# Patient Record
Sex: Male | Born: 1950
Health system: Southern US, Community
[De-identification: ages and names within clinical notes are randomized; demographics above are authoritative.]

## PROBLEM LIST (undated history)

## (undated) DIAGNOSIS — K922 Gastrointestinal hemorrhage, unspecified: Secondary | ICD-10-CM

## (undated) DIAGNOSIS — I10 Essential (primary) hypertension: Secondary | ICD-10-CM

## (undated) DIAGNOSIS — E785 Hyperlipidemia, unspecified: Secondary | ICD-10-CM

## (undated) DIAGNOSIS — D649 Anemia, unspecified: Secondary | ICD-10-CM

## (undated) DIAGNOSIS — K219 Gastro-esophageal reflux disease without esophagitis: Secondary | ICD-10-CM

## (undated) DIAGNOSIS — C801 Malignant (primary) neoplasm, unspecified: Secondary | ICD-10-CM

## (undated) DIAGNOSIS — R531 Weakness: Secondary | ICD-10-CM

## (undated) HISTORY — PX: SKIN SURGERY: SHX2413

---

## 2008-12-30 ENCOUNTER — Emergency Department (HOSPITAL_COMMUNITY): Admission: EM | Admit: 2008-12-30 | Discharge: 2008-12-30 | Payer: Self-pay | Admitting: Emergency Medicine

## 2014-03-29 IMAGING — US US ABDOMEN LIMITED
1 series · 13 of 25 positions shown · non-contrast
Comparison: None.

CLINICAL DATA: Transaminitis. History of duodenal ulcer with
adherent clot.

EXAM:
US ABDOMEN LIMITED - RIGHT UPPER QUADRANT

[Series 1: us abdomen limited · 0.30mm/px · 13 of 62 slices shown]
[im 1/62]
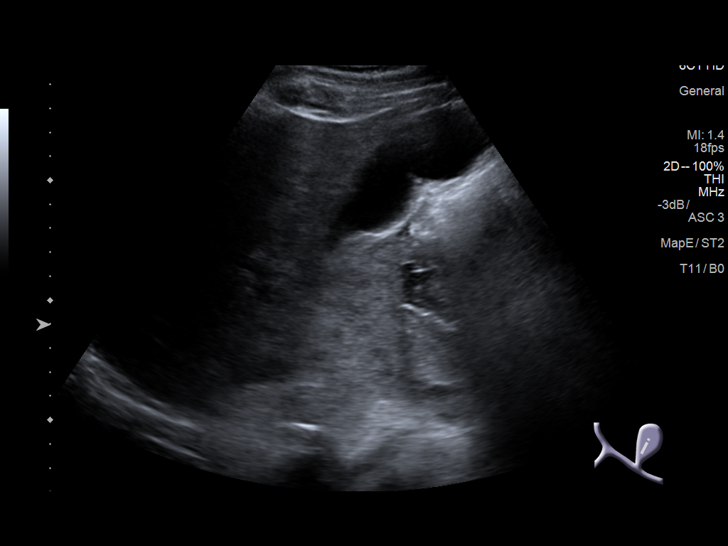
[im 6/62]
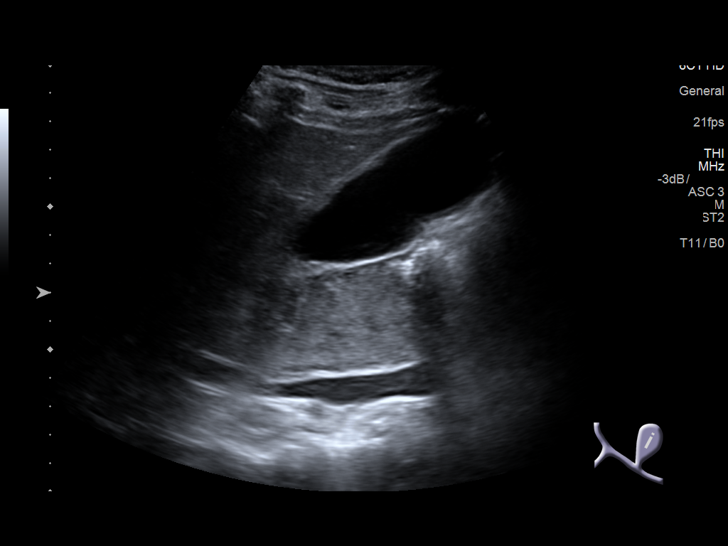
[im 11/62]
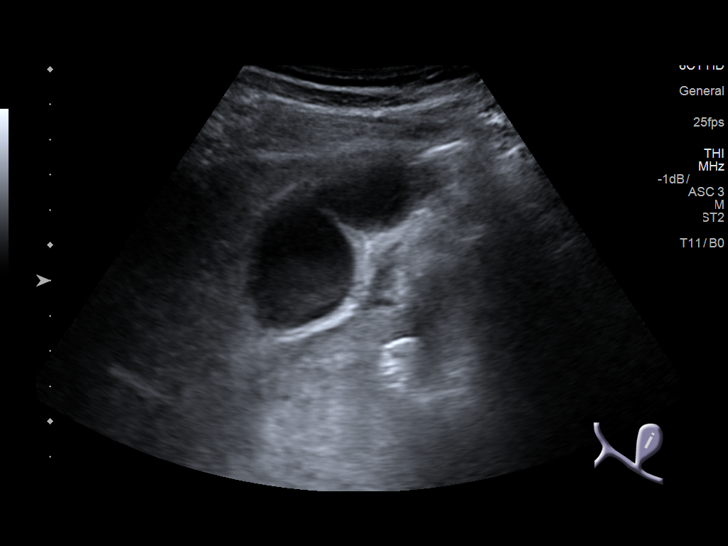
[im 16/62]
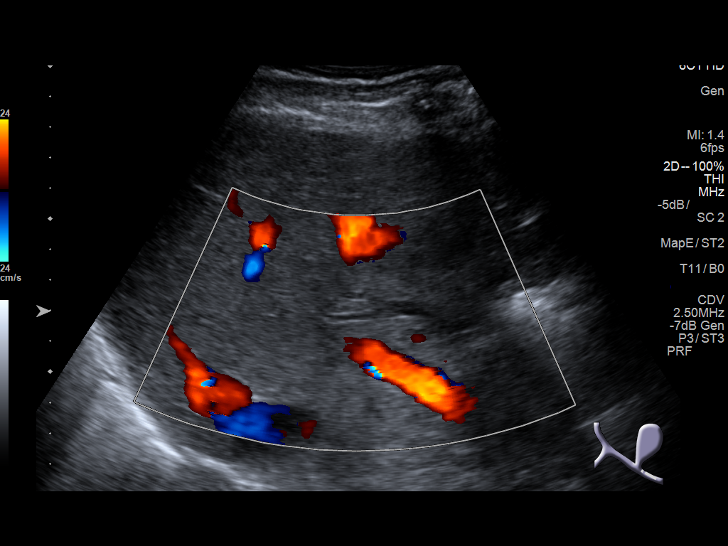
[im 21/62]
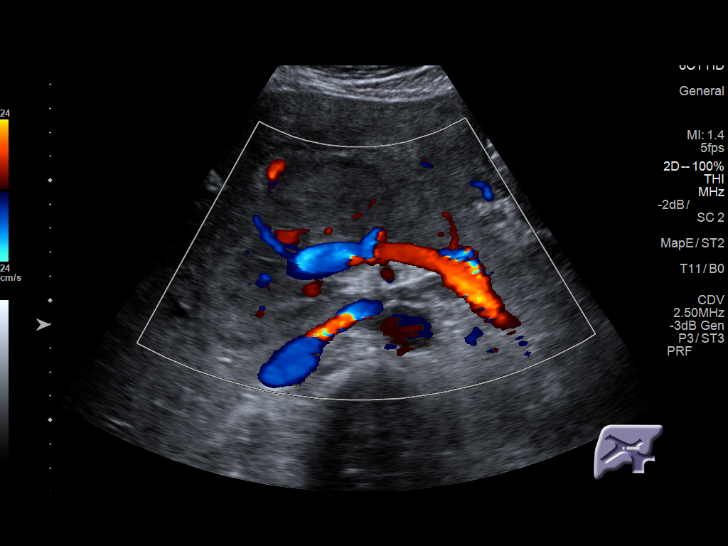
[im 26/62]
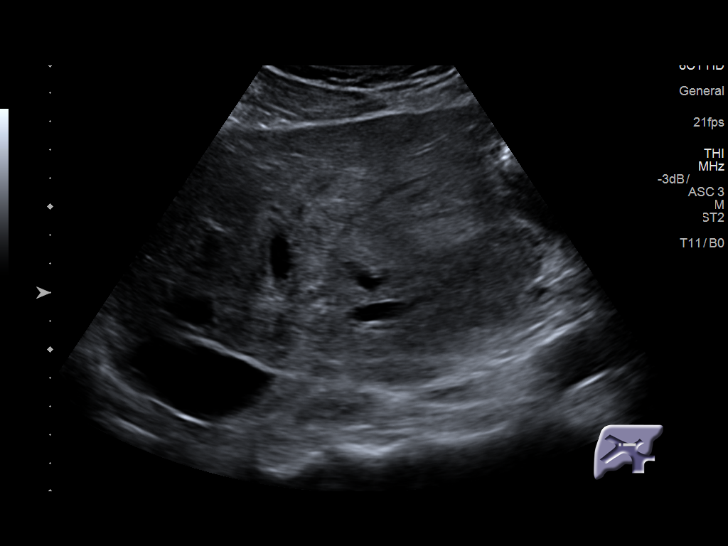
[im 31/62]
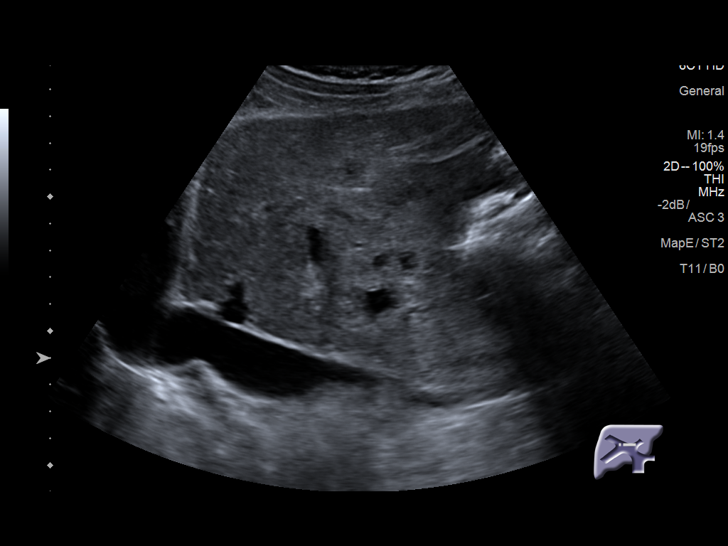
[im 36/62]
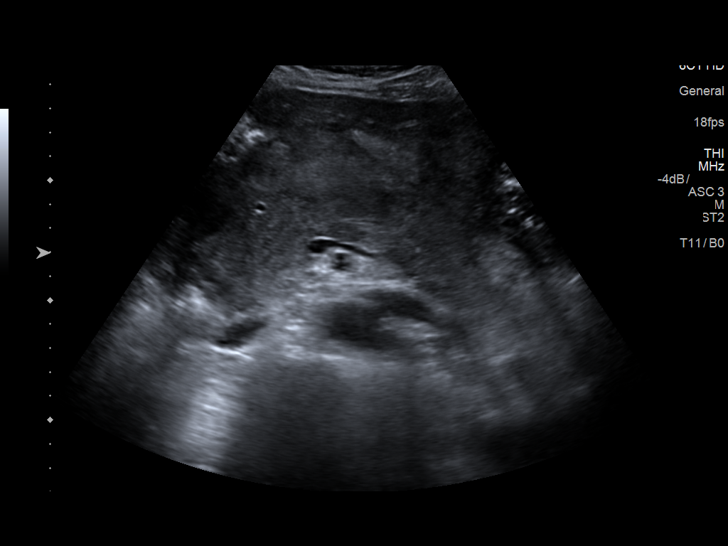
[im 41/62]
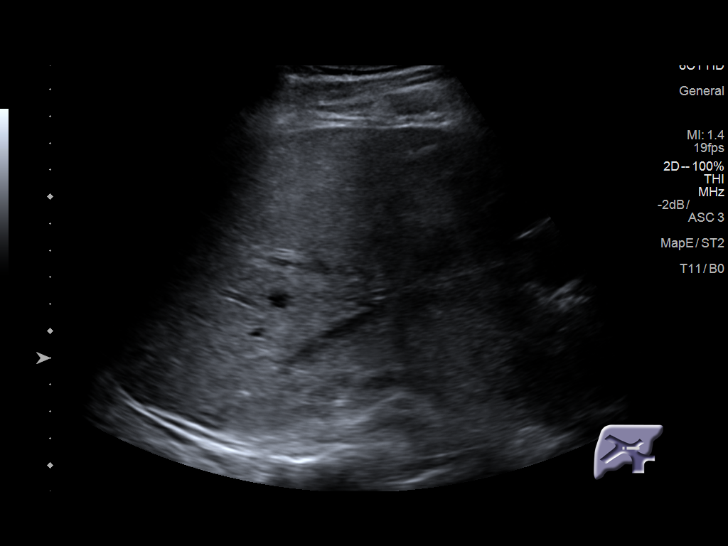
[im 46/62]
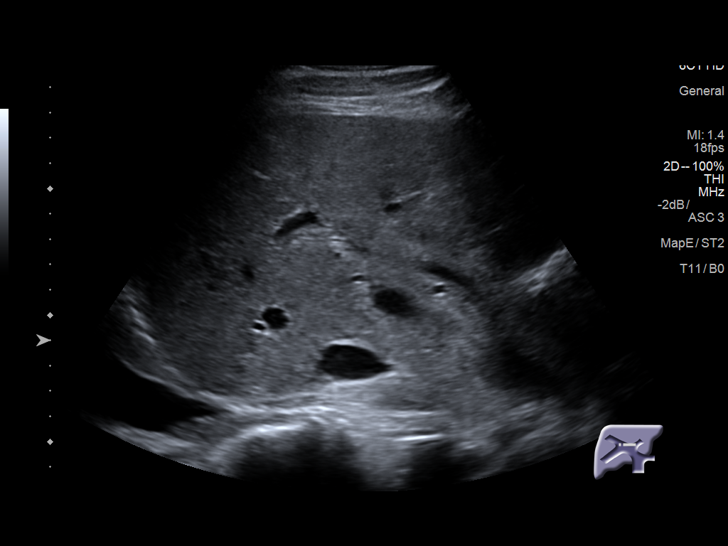
[im 51/62]
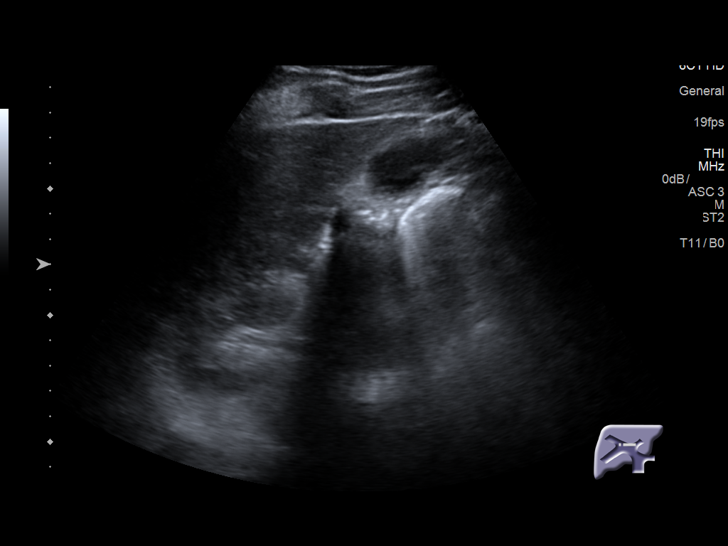
[im 56/62]
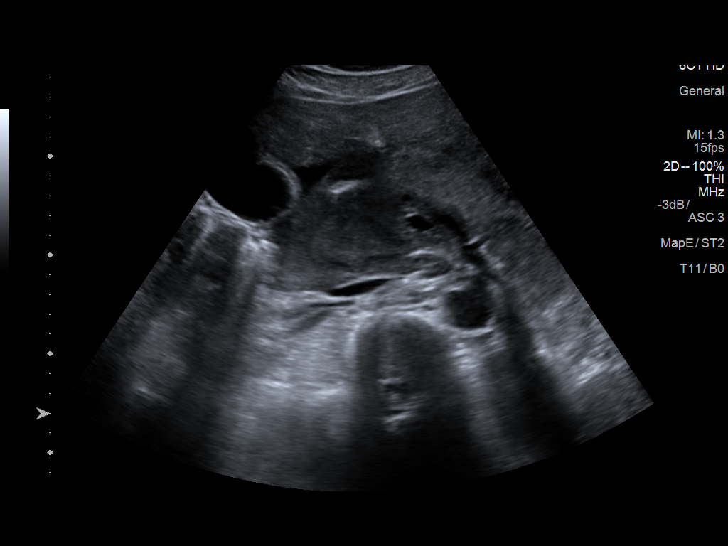
[im 62/62]
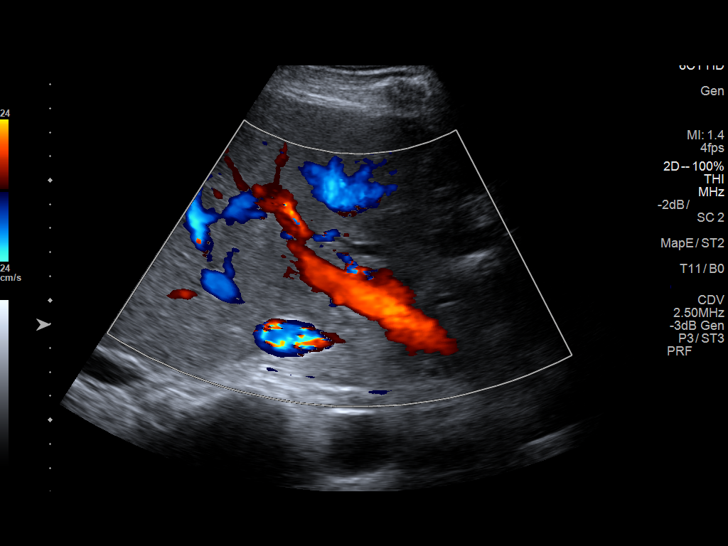

[13 of 25 positions shown; findings below may reference images not displayed]

FINDINGS: Gallbladder:

No gallstones or wall thickening visualized. Minimal dependent
sludge. No sonographic Murphy sign noted. Small amounts of
pericholecystic free fluid.

Common bile duct:

Diameter: 4.2 mm.

Liver:

Ill-defined heterogeneous echogenicity in the region of the
pancreatic head and porta hepatis, borders not well-defined. Mildly
heterogeneous hepatic parenchyma. No intrahepatic biliary ductal
dilatation. Normal directional flow in the main portal vein.

Right pleural effusion, incidentally noted.
IMPRESSION: 1. Heterogeneous echogenicity in the region of the porta hepatis and
pancreatic head, difficult to separate from the adjacent liver
parenchyma. This may reflect hematoma/blood products in the setting
of bleeding duodenal ulcer versus pancreatic head or focal liver
lesion. There is no biliary ductal dilatation. Recommend further
characterization with cross-sectional imaging, contrast-enhanced CT
or MRI.
2. Minimal sludge in the gallbladder, no wall thickening or
gallstones. Small amount of pericholecystic fluid. No sonographic
Murphy sign or findings of acute cholecystitis.

## 2015-01-17 ENCOUNTER — Encounter (HOSPITAL_COMMUNITY): Payer: Self-pay | Admitting: Emergency Medicine

## 2015-01-17 ENCOUNTER — Inpatient Hospital Stay (HOSPITAL_COMMUNITY)
Admission: EM | Admit: 2015-01-17 | Discharge: 2015-01-22 | DRG: 988 | Disposition: A | Discharge status: Home / Self Care | Payer: BLUE CROSS/BLUE SHIELD | Attending: Internal Medicine | Admitting: Internal Medicine

## 2015-01-17 DIAGNOSIS — Z9114 Patient's other noncompliance with medication regimen: Secondary | ICD-10-CM

## 2015-01-17 DIAGNOSIS — K921 Melena: Secondary | ICD-10-CM

## 2015-01-17 DIAGNOSIS — R1900 Intra-abdominal and pelvic swelling, mass and lump, unspecified site: Secondary | ICD-10-CM | POA: Diagnosis present

## 2015-01-17 DIAGNOSIS — R7989 Other specified abnormal findings of blood chemistry: Secondary | ICD-10-CM | POA: Diagnosis not present

## 2015-01-17 DIAGNOSIS — K922 Gastrointestinal hemorrhage, unspecified: Secondary | ICD-10-CM

## 2015-01-17 DIAGNOSIS — J9859 Other diseases of mediastinum, not elsewhere classified: Secondary | ICD-10-CM

## 2015-01-17 DIAGNOSIS — R740 Nonspecific elevation of levels of transaminase and lactic acid dehydrogenase [LDH]: Secondary | ICD-10-CM

## 2015-01-17 DIAGNOSIS — D649 Anemia, unspecified: Secondary | ICD-10-CM | POA: Diagnosis present

## 2015-01-17 DIAGNOSIS — Z8249 Family history of ischemic heart disease and other diseases of the circulatory system: Secondary | ICD-10-CM | POA: Diagnosis not present

## 2015-01-17 DIAGNOSIS — K264 Chronic or unspecified duodenal ulcer with hemorrhage: Secondary | ICD-10-CM | POA: Diagnosis present

## 2015-01-17 DIAGNOSIS — R590 Localized enlarged lymph nodes: Secondary | ICD-10-CM | POA: Diagnosis present

## 2015-01-17 DIAGNOSIS — R935 Abnormal findings on diagnostic imaging of other abdominal regions, including retroperitoneum: Secondary | ICD-10-CM

## 2015-01-17 DIAGNOSIS — E785 Hyperlipidemia, unspecified: Secondary | ICD-10-CM | POA: Diagnosis present

## 2015-01-17 DIAGNOSIS — I959 Hypotension, unspecified: Secondary | ICD-10-CM | POA: Diagnosis present

## 2015-01-17 DIAGNOSIS — Z79899 Other long term (current) drug therapy: Secondary | ICD-10-CM

## 2015-01-17 DIAGNOSIS — D801 Nonfamilial hypogammaglobulinemia: Secondary | ICD-10-CM | POA: Diagnosis not present

## 2015-01-17 DIAGNOSIS — D62 Acute posthemorrhagic anemia: Secondary | ICD-10-CM | POA: Insufficient documentation

## 2015-01-17 DIAGNOSIS — R55 Syncope and collapse: Secondary | ICD-10-CM | POA: Diagnosis present

## 2015-01-17 DIAGNOSIS — R7401 Elevation of levels of liver transaminase levels: Secondary | ICD-10-CM

## 2015-01-17 DIAGNOSIS — I10 Essential (primary) hypertension: Secondary | ICD-10-CM | POA: Diagnosis present

## 2015-01-17 DIAGNOSIS — D508 Other iron deficiency anemias: Secondary | ICD-10-CM | POA: Diagnosis not present

## 2015-01-17 HISTORY — DX: Essential (primary) hypertension: I10

## 2015-01-17 HISTORY — DX: Hyperlipidemia, unspecified: E78.5

## 2015-01-17 LAB — CBC
HCT: 14.8 % — ABNORMAL LOW (ref 39.0–52.0)
Hemoglobin: 4.5 g/dL — CL (ref 13.0–17.0)
MCH: 25 pg — ABNORMAL LOW (ref 26.0–34.0)
MCHC: 30.4 g/dL (ref 30.0–36.0)
MCV: 82.2 fL (ref 78.0–100.0)
PLATELETS: 295 10*3/uL (ref 150–400)
RBC: 1.8 MIL/uL — AB (ref 4.22–5.81)
RDW: 14.1 % (ref 11.5–15.5)
WBC: 9.1 10*3/uL (ref 4.0–10.5)

## 2015-01-17 LAB — RAPID URINE DRUG SCREEN, HOSP PERFORMED
Amphetamines: NOT DETECTED
BARBITURATES: NOT DETECTED
BENZODIAZEPINES: NOT DETECTED
COCAINE: NOT DETECTED
OPIATES: NOT DETECTED
Tetrahydrocannabinol: NOT DETECTED

## 2015-01-17 LAB — CBC WITH DIFFERENTIAL/PLATELET
Basophils Absolute: 0 10*3/uL (ref 0.0–0.1)
Basophils Relative: 0 %
EOS ABS: 0.1 10*3/uL (ref 0.0–0.7)
Eosinophils Relative: 1 %
HEMATOCRIT: 16 % — AB (ref 39.0–52.0)
HEMOGLOBIN: 5.1 g/dL — AB (ref 13.0–17.0)
LYMPHS ABS: 1.5 10*3/uL (ref 0.7–4.0)
Lymphocytes Relative: 15 %
MCH: 25.6 pg — AB (ref 26.0–34.0)
MCHC: 31.9 g/dL (ref 30.0–36.0)
MCV: 80.4 fL (ref 78.0–100.0)
MONO ABS: 0.5 10*3/uL (ref 0.1–1.0)
MONOS PCT: 5 %
NEUTROS PCT: 79 %
Neutro Abs: 7.6 10*3/uL (ref 1.7–7.7)
Platelets: 343 10*3/uL (ref 150–400)
RBC: 1.99 MIL/uL — ABNORMAL LOW (ref 4.22–5.81)
RDW: 14.2 % (ref 11.5–15.5)
WBC: 9.6 10*3/uL (ref 4.0–10.5)

## 2015-01-17 LAB — PREPARE RBC (CROSSMATCH)

## 2015-01-17 LAB — COMPREHENSIVE METABOLIC PANEL
ALK PHOS: 82 U/L (ref 38–126)
ALT: 97 U/L — ABNORMAL HIGH (ref 17–63)
ANION GAP: 9 (ref 5–15)
AST: 38 U/L (ref 15–41)
Albumin: 2.7 g/dL — ABNORMAL LOW (ref 3.5–5.0)
BILIRUBIN TOTAL: 0.3 mg/dL (ref 0.3–1.2)
BUN: 43 mg/dL — ABNORMAL HIGH (ref 6–20)
CALCIUM: 7.9 mg/dL — AB (ref 8.9–10.3)
CO2: 28 mmol/L (ref 22–32)
Chloride: 102 mmol/L (ref 101–111)
Creatinine, Ser: 1.22 mg/dL (ref 0.61–1.24)
GFR calc non Af Amer: >60 mL/min (ref >60)
Glucose, Bld: 122 mg/dL — ABNORMAL HIGH (ref 65–99)
Potassium: 4.1 mmol/L (ref 3.5–5.1)
SODIUM: 139 mmol/L (ref 135–145)
TOTAL PROTEIN: 5.5 g/dL — AB (ref 6.5–8.1)

## 2015-01-17 LAB — POC OCCULT BLOOD, ED: Fecal Occult Bld: POSITIVE — AB

## 2015-01-17 LAB — ABO/RH: ABO/RH(D): AB POS

## 2015-01-17 LAB — PROTIME-INR
INR: 1.2 (ref 0.00–1.49)
Prothrombin Time: 15.4 seconds — ABNORMAL HIGH (ref 11.6–15.2)

## 2015-01-17 LAB — APTT: APTT: 31 s (ref 24–37)

## 2015-01-17 MED ORDER — CALCIUM CARBONATE ANTACID 500 MG PO CHEW: 1.0000 | CHEWABLE_TABLET | Freq: Every day | ORAL | Status: DC | PRN

## 2015-01-17 MED ORDER — PANTOPRAZOLE SODIUM 40 MG IV SOLR
40.0000 mg | Freq: Once | INTRAVENOUS | Status: AC
  Administered 2015-01-17: 40 mg via INTRAVENOUS
  Filled 2015-01-17: qty 40

## 2015-01-17 MED ORDER — SODIUM CHLORIDE 0.9 % IV BOLUS (SEPSIS)
500.0000 mL | Freq: Once | INTRAVENOUS | Status: AC
  Administered 2015-01-17: 500 mL via INTRAVENOUS

## 2015-01-17 MED ORDER — SODIUM CHLORIDE 0.9 % IV SOLN
10.0000 mL/h | Freq: Once | INTRAVENOUS | Status: AC
  Administered 2015-01-17: 10 mL/h via INTRAVENOUS

## 2015-01-17 MED ORDER — SODIUM CHLORIDE 0.9 % IV SOLN
INTRAVENOUS | Status: DC
  Administered 2015-01-17 – 2015-01-18 (×2): via INTRAVENOUS
  Administered 2015-01-18: 250 mL via INTRAVENOUS
  Administered 2015-01-18 – 2015-01-20 (×5): via INTRAVENOUS
  Administered 2015-01-21: 50 mL via INTRAVENOUS
  Administered 2015-01-22: 09:00:00 via INTRAVENOUS

## 2015-01-17 MED ORDER — HYDROXYZINE HCL 50 MG/ML IM SOLN: 25.0000 mg | Freq: Four times a day (QID) | INTRAMUSCULAR | Status: DC | PRN

## 2015-01-17 MED ORDER — HYDRALAZINE HCL 20 MG/ML IJ SOLN: 5.0000 mg | INTRAMUSCULAR | Status: DC | PRN

## 2015-01-17 MED ORDER — PANTOPRAZOLE SODIUM 40 MG IV SOLR
40.0000 mg | Freq: Two times a day (BID) | INTRAVENOUS | Status: DC
  Administered 2015-01-17 – 2015-01-18 (×2): 40 mg via INTRAVENOUS
  Filled 2015-01-17 (×2): qty 40

## 2015-01-17 MED ORDER — ONDANSETRON HCL 4 MG/2ML IJ SOLN
4.0000 mg | Freq: Once | INTRAMUSCULAR | Status: AC
  Administered 2015-01-17: 4 mg via INTRAVENOUS
  Filled 2015-01-17: qty 2

## 2015-01-17 MED ORDER — SODIUM CHLORIDE 0.9 % IJ SOLN
3.0000 mL | Freq: Two times a day (BID) | INTRAMUSCULAR | Status: DC
  Administered 2015-01-17 – 2015-01-22 (×6): 3 mL via INTRAVENOUS

## 2015-01-17 MED ORDER — SODIUM CHLORIDE 0.9 % IV BOLUS (SEPSIS)
1000.0000 mL | Freq: Once | INTRAVENOUS | Status: AC
  Administered 2015-01-17: 1000 mL via INTRAVENOUS

## 2015-01-17 MED ORDER — MORPHINE SULFATE (PF) 2 MG/ML IV SOLN
2.0000 mg | INTRAVENOUS | Status: DC | PRN
  Filled 2015-01-17: qty 1

## 2015-01-17 NOTE — H&P (Addendum)
[facility] History and Physical  [name] [id number] DOB: 1950/03/03 DOA: 01/17/2015  Referring physician: ED physician PCP: No primary care provider on file.  Specialists:   Chief Complaint: Dark stool, dizziness and generalized weakness  HPI: [name] is a 64 y.o. male with PMH of hypertension, hyperlipidemia, medication noncompliance, who presents with black stool and the dizziness.  Patient reports that he has been having dark stool, generalized weakness and dizziness in the past 3 days. He had minor fall without any injury twice. He has mild epigastric discomfort. He does not have chest pain, shortness of breath, unilateral weakness, numbness, vision change, hearing loss. No fever, chills, symptoms of UTI. He drinks alcohol occasionally. Not using NSIADs. Patient states that he has not been taking his blood pressure medications and cholesterol medication (lovastatin) for more than 6 months.  In ED, patient was found to have positive FOBT, hemoglobin 5.1-->5.4, WBC 9.6, inr 1.2, PTT 31, temperature normal, tachycardia, electrolytes and renal function okay, abnormal liver function with AST 38, ALT 97, total bilirubin 0.3, ALP 82. Patient's admitted to inpatient for further eval and treatment.  Where does patient live?   At home  Can patient participate in ADLs?  Yes      Review of Systems:   General: no fevers, chills, no changes in body weight, has poor appetite, has fatigue HEENT: no blurry vision, hearing changes or sore throat Pulm: no dyspnea, coughing, wheezing CV: no chest pain, palpitations Abd: no nausea, vomiting, diarrhea, constipation. Has discomfort over epigastric area and dark stool  GU: no dysuria, burning on urination, increased urinary frequency, hematuria  Ext: no leg edema Neuro: no unilateral weakness, numbness, or tingling, no vision change or hearing loss Skin: no rash MSK: No muscle spasm, no deformity, no limitation of range of  movement in spin. Has dizziness Heme: No easy bruising.  Travel history: No recent long distant travel.  Allergy: No Known Allergies  Past Medical History  Diagnosis Date  . Essential hypertension   . HLD (hyperlipidemia)     Past Surgical History  Procedure Laterality Date  . Skin surgery      Small benign cysts over left scalp removed    Social History:  reports that he has never smoked. He does not have any smokeless tobacco history on file. He reports that he does not drink alcohol. His drug history is not on file.  Family History:  Family History  Problem Relation Age of Onset  . Hypertension Mother   . Hypertension Father   . Hypertension Sister   . Diabetes Sister   . Prostate cancer Brother   . Lupus Sister   . Kidney failure Father      Prior to Admission medications   Medication Sig Start Date End Date Taking? Authorizing Provider  calcium carbonate (TUMS - DOSED IN MG ELEMENTAL CALCIUM) 500 MG chewable tablet Chew 1 tablet by mouth daily as needed for indigestion or heartburn.   Yes Historical Provider, MD  sodium-potassium bicarbonate (ALKA-SELTZER GOLD) TBEF dissolvable tablet Take 1 tablet by mouth daily as needed.   Yes Historical Provider, MD  lisinopril-hydrochlorothiazide (PRINZIDE,ZESTORETIC) 20-25 MG tablet Take 1 tablet by mouth daily.    Historical Provider, MD  lovastatin (MEVACOR) 20 MG tablet Take 20 mg by mouth at bedtime.    Historical Provider, MD    Physical Exam: Filed Vitals:   01/17/15 2300 01/17/15 2343 01/18/15 0000 01/18/15 0015  BP: 107/57 106/58 106/60 100/61  Pulse:  Temp:  98 F (36.7 C)  98 F (36.7 C)  TempSrc:  Oral  Oral  Resp: 18 19 19 17   Height:      Weight:      SpO2: 98% 99% 99% 100%   General: Not in acute distress HEENT:       Eyes: PERRL, EOMI, no scleral icterus.       ENT: No discharge from the ears and nose, no pharynx injection, no tonsillar enlargement.        Neck: No JVD, no bruit, no mass  felt. Heme: No neck lymph node enlargement. Cardiac: S1/S2, RRR, No murmurs, No gallops or rubs. Pulm:  No rales, wheezing, rhonchi or rubs. Abd: Soft, nondistended, nontender, no rebound pain, no organomegaly, BS present. Ext: No pitting leg edema bilaterally. 2+DP/PT pulse bilaterally. Musculoskeletal: No joint deformities, No joint redness or warmth, no limitation of ROM in spin. Skin: No rashes.  Neuro: Alert, oriented X3, cranial nerves II-XII grossly intact, muscle strength 5/5 in all extremities, sensation to light touch intact.  Psych: Patient is not psychotic, no suicidal or hemocidal ideation.  Labs on Admission:  Basic Metabolic Panel:  Recent Labs Lab 01/17/15 1740  NA 139  K 4.1  CL 102  CO2 28  GLUCOSE 122*  BUN 43*  CREATININE 1.22  CALCIUM 7.9*   Liver Function Tests:  Recent Labs Lab 01/17/15 1740  AST 38  ALT 97*  ALKPHOS 82  BILITOT 0.3  PROT 5.5*  ALBUMIN 2.7*   No results for input(s): LIPASE, AMYLASE in the last 168 hours. No results for input(s): AMMONIA in the last 168 hours. CBC:  Recent Labs Lab 01/17/15 1740 01/17/15 2119  WBC 9.6 9.1  NEUTROABS 7.6  --   HGB 5.1* 4.5*  HCT 16.0* 14.8*  MCV 80.4 82.2  PLT 343 295   Cardiac Enzymes: No results for input(s): CKTOTAL, CKMB, CKMBINDEX, TROPONINI in the last 168 hours.  BNP (last 3 results) No results for input(s): BNP in the last 8760 hours.  ProBNP (last 3 results) No results for input(s): PROBNP in the last 8760 hours.  CBG: No results for input(s): GLUCAP in the last 168 hours.  Radiological Exams on Admission: No results found.  EKG: Independently reviewed. QTC 528, tachycardia, PAC .   Assessment/Plan Principal Problem:   GIB (gastrointestinal bleeding) Active Problems:   Essential hypertension   Anemia   HLD (hyperlipidemia)  GIB (gastrointestinal bleeding): Source of bleeding is not clear. Patient denies alcohol drinking and use of NSAIDs. Hgb 4.5 on  admission. No Hgb data on record. Hemodynamically stable. Mental status is normal. No chest pain. GI, Dr. Cristina Gong was consulted with EDP, recommended check Hgb in 4 hours, if drop further, will need to call him.  - will admit to SDU - NS bolus 2L and then 100 cc/h - transfuse 4 units of blood - GI consulted by Ed, will follow up recommendations - NPO for possible EGD - Start IV pantoprazole 40 mg bib - Hydroxyzine when necessary for nausea - Avoid NSAIDs and SQ heparin - Maintain IV access (2 large bore IVs if possible). - Monitor closely and follow q6h cbc, transfuse as necessary. - LaB: INR, PTT, hepatitis panel   HTN: not taking meds for 6 month.  -IV hydralazine prn  HLD: Last LDL was not record. Used to be no lovastatin, but not taking his medication for more than 6 months. -Check FLP  DVT ppx: SCD Code Status: Full code Family Communication:  Yes,  patient's wife and father at bed side Disposition Plan: Admit to inpatient   Date of Service 01/18/2015    Ivor Costa [facility] Pager [phone]  If 7PM-7AM, please contact night-coverage [email or web address] Password [email or web address] 01/18/2015, 1:20 AM

## 2015-01-17 NOTE — ED Provider Notes (Signed)
CSN: [id number]     Arrival date & time 01/17/15  1605 History   First MD Initiated Contact with Patient 01/17/15 1626     Chief Complaint  Patient presents with  . Loss of Consciousness  . dark stool   . Dizziness    Patient is a 64 y.o. male presenting with syncope and dizziness. The history is provided by the patient.  Loss of Consciousness Episode history:  Multiple Most recent episode:  Today Timing:  Intermittent Progression:  Unchanged Chronicity:  New Context comment:  Dark stools Relieved by:  Nothing Worsened by:  Nothing tried Associated symptoms: difficulty breathing and dizziness   Associated symptoms: no chest pain, no fever, no focal weakness and no seizures   Dizziness Associated symptoms: syncope   Associated symptoms: no chest pain   Pt reports over past day he has had indigestion as well as dark stool - no blood in stool He does not take iron pills or pepto He does take aleve occasionally Today at work he felt very weak and had to leave early While at home he had two brief syncopal episodes (denies any injury, no seizures)and his wife called 911 No CP is reported He only feels fatigue at this time No h/o ulcer disease   PMH - HTN/HLP Soc hx - occasional ETOH Social History  Substance Use Topics  . Smoking status: Never Smoker   . Smokeless tobacco: None  . Alcohol Use: No    Review of Systems  Constitutional: Positive for fatigue. Negative for fever.  Cardiovascular: Positive for syncope. Negative for chest pain.  Gastrointestinal: Positive for abdominal pain.  Neurological: Positive for dizziness and syncope. Negative for focal weakness and seizures.  All other systems reviewed and are negative.     Allergies  Review of patient's allergies indicates no known allergies.  Home Medications   Prior to Admission medications   Medication Sig Start Date End Date Taking? Authorizing Provider  calcium carbonate (TUMS - DOSED IN MG ELEMENTAL  CALCIUM) 500 MG chewable tablet Chew 1 tablet by mouth daily as needed for indigestion or heartburn.   Yes Historical Provider, MD  sodium-potassium bicarbonate (ALKA-SELTZER GOLD) TBEF dissolvable tablet Take 1 tablet by mouth daily as needed.   Yes Historical Provider, MD  lisinopril-hydrochlorothiazide (PRINZIDE,ZESTORETIC) 20-25 MG tablet Take 1 tablet by mouth daily.    Historical Provider, MD  lovastatin (MEVACOR) 20 MG tablet Take 20 mg by mouth at bedtime.    Historical Provider, MD   BP 118/84 mmHg  Pulse 126  Temp(Src) 97.4 F (36.3 C) (Oral)  Resp 11  Ht 5\' 11"  (1.803 m)  Wt 92.987 kg  BMI 28.60 kg/m2  SpO2 94% Physical Exam CONSTITUTIONAL: Well developed/well nourished HEAD: Normocephalic/atraumatic EYES: EOMI ENMT: Mucous membranes moist NECK: supple no meningeal signs SPINE/BACK:entire spine nontender CV: S1/S2 noted, no murmurs/rubs/gallops noted LUNGS: Lungs are clear to auscultation bilaterally, no apparent distress ABDOMEN: soft, nontender, no rebound or guarding, bowel sounds noted throughout abdomen Rectal - stool dark in color, hemoccult positive, nurse sabrina present for exam, no rectal mass noted GU:no cva tenderness NEURO: Pt is awake/alert/appropriate, moves all extremitiesx4.    EXTREMITIES: pulses normal/equal, full ROM SKIN: warm, color normal, lipoma noted to upper back, no tenderness noted PSYCH: no abnormalities of mood noted, alert and oriented to situation  ED Course  Procedures  CRITICAL CARE Performed by: Sharyon Cable Total critical care time: 33 minutes Critical care time was exclusive of separately billable procedures and treating other patients.  Critical care was necessary to treat or prevent imminent or life-threatening deterioration. Critical care was time spent personally by me on the following activities: development of treatment plan with patient and/or surrogate as well as nursing, discussions with consultants, evaluation of  patient's response to treatment, examination of patient, obtaining history from patient or surrogate, ordering and performing treatments and interventions, ordering and review of laboratory studies, ordering and review of radiographic studies, pulse oximetry and re-evaluation of patient's condition. PATIENT WITH ACUTE ANEMIA AND REQUIRING BLOOD TRANSFUSION,ADMISSION AND LIKELY EGD Labs Review Labs Reviewed  CBC WITH DIFFERENTIAL/PLATELET - Abnormal; Notable for the following:    RBC 1.99 (*)    HCT 16.0 (*)    MCH 25.6 (*)    All other components within normal limits  COMPREHENSIVE METABOLIC PANEL - Abnormal; Notable for the following:    Glucose, Bld 122 (*)    BUN 43 (*)    Calcium 7.9 (*)    Total Protein 5.5 (*)    Albumin 2.7 (*)    ALT 97 (*)    All other components within normal limits  POC OCCULT BLOOD, ED - Abnormal; Notable for the following:    Fecal Occult Bld POSITIVE (*)    All other components within normal limits  PROTIME-INR  TYPE AND SCREEN  PREPARE RBC (CROSSMATCH)  ABO/RH    I have personally reviewed and evaluated these lab results as part of my medical decision-making.   EKG Interpretation   Date/Time:  Wednesday January 17 2015 16:16:15 EST Ventricular Rate:  132 PR Interval:  86 QRS Duration: 79 QT Interval:  356 QTC Calculation: 528 R Axis:   79 Text Interpretation:  Ectopic atrial tachycardia, unifocal Borderline T  abnormalities, lateral leads Prolonged QT interval Abnormal ekg No  previous ECGs available Confirmed by Christy Gentles  MD, Jessaca Philippi ([id number]) on  01/17/2015 4:27:23 PM     5:16 PM Pt with indigestiont/abd pain and dark stools Hemoccult positive Apparently he was hypotensive at scene Concern for acute GI bleed Discussed risk/benefits of blood transfusion (if necessary) and pt accepts blood products if necessary 6:11 PM Abnormal labs noted PRBC ordered GI consult placed 6:35 PM D/w dr Cristina Gong Will need blood transfusion At this  point, unless he shows signs of active GI bleed, will not need to perform emergent EGD at this time and wait until the morning He would like to see how he responds to transfusion over next 4 hrs 7:16 PM D/W DR NIU WILL ADMIT TO STEPDOWN HE UNDERSTANDS NEED TO CONSULT GI IN 4 HOURS IF ANY WORSENING SIGNS OF BLEEDING/ANEMIA PT AWAKE/ALERT FEELS IMPROVED AT THIS TIME PRBC TRANFUSION TO START SOON BP 108/75 mmHg  Pulse 97  Temp(Src) 97.4 F (36.3 C) (Oral)  Resp 15  Ht 5\' 11"  (1.803 m)  Wt 92.987 kg  BMI 28.60 kg/m2  SpO2 96%   Medications  0.9 %  sodium chloride infusion (not administered)  ondansetron (ZOFRAN) injection 4 mg (not administered)  sodium chloride 0.9 % bolus 500 mL (not administered)  0.9 %  sodium chloride infusion (not administered)  pantoprazole (PROTONIX) injection 40 mg (not administered)  sodium chloride 0.9 % bolus 1,000 mL (1,000 mLs Intravenous New Bag/Given 01/17/15 1751)  pantoprazole (PROTONIX) injection 40 mg (40 mg Intravenous Given 01/17/15 1751)    MDM   Final diagnoses:  Acute GI bleeding  Acute blood loss anemia    Nursing notes including past medical history and social history reviewed and considered in documentation Labs/vital reviewed myself and  considered during evaluation     Ripley Fraise, MD 01/17/15 [phone]

## 2015-01-17 NOTE — ED Notes (Signed)
MD at bedside. 

## 2015-01-17 NOTE — Progress Notes (Signed)
CRITICAL VALUE ALERT  Critical value received:  Hemoglobin 4.5  Date of notification:  01/17/2015   Time of notification:  10:09 PM   Critical value read back:Yes.    Nurse who received alert:  Reap, Jon Gills   MD notified (1st page):  TRH  Time of first page:  10:09 PM   MD notified (2nd page):  Time of second page:  Responding MD:    Time MD responded:

## 2015-01-17 NOTE — ED Notes (Signed)
Per EMS- pt c.o. Dark stools and dizziness for three days. Multiple syncopal episodes today. CBG 112. HR 123. Pt had weak pulses upon EMS arrival.  Initial BP 70's. PIV 20G R hand.

## 2015-01-18 ENCOUNTER — Encounter (HOSPITAL_COMMUNITY): Payer: Self-pay | Admitting: Internal Medicine

## 2015-01-18 ENCOUNTER — Encounter (HOSPITAL_COMMUNITY)
Admission: EM | Disposition: A | Discharge status: Home / Self Care | Payer: Self-pay | Source: Home / Self Care | Attending: Internal Medicine

## 2015-01-18 DIAGNOSIS — R7989 Other specified abnormal findings of blood chemistry: Secondary | ICD-10-CM

## 2015-01-18 HISTORY — PX: ESOPHAGOGASTRODUODENOSCOPY: SHX5428

## 2015-01-18 LAB — CBC
HCT: 25 % — ABNORMAL LOW (ref 39.0–52.0)
HCT: 23.2 % — ABNORMAL LOW (ref 39.0–52.0)
HEMATOCRIT: 25.3 % — AB (ref 39.0–52.0)
HEMOGLOBIN: 8.4 g/dL — AB (ref 13.0–17.0)
HEMOGLOBIN: 8.2 g/dL — AB (ref 13.0–17.0)
HEMOGLOBIN: 7.8 g/dL — AB (ref 13.0–17.0)
MCH: 28.1 pg (ref 26.0–34.0)
MCH: 27.2 pg (ref 26.0–34.0)
MCH: 28 pg (ref 26.0–34.0)
MCHC: 33.6 g/dL (ref 30.0–36.0)
MCHC: 32.4 g/dL (ref 30.0–36.0)
MCHC: 33.6 g/dL (ref 30.0–36.0)
MCV: 83.6 fL (ref 78.0–100.0)
MCV: 83.8 fL (ref 78.0–100.0)
MCV: 83.2 fL (ref 78.0–100.0)
Platelets: 253 10*3/uL (ref 150–400)
Platelets: 280 10*3/uL (ref 150–400)
Platelets: 246 10*3/uL (ref 150–400)
RBC: 2.99 MIL/uL — AB (ref 4.22–5.81)
RBC: 3.02 MIL/uL — ABNORMAL LOW (ref 4.22–5.81)
RBC: 2.79 MIL/uL — AB (ref 4.22–5.81)
RDW: 14.9 % (ref 11.5–15.5)
RDW: 15 % (ref 11.5–15.5)
RDW: 15 % (ref 11.5–15.5)
WBC: 11 10*3/uL — ABNORMAL HIGH (ref 4.0–10.5)
WBC: 9.5 10*3/uL (ref 4.0–10.5)
WBC: 7.9 10*3/uL (ref 4.0–10.5)

## 2015-01-18 LAB — COMPREHENSIVE METABOLIC PANEL
ALBUMIN: 2.6 g/dL — AB (ref 3.5–5.0)
ALT: 84 U/L — AB (ref 17–63)
AST: 42 U/L — AB (ref 15–41)
Alkaline Phosphatase: 73 U/L (ref 38–126)
Anion gap: 6 (ref 5–15)
BUN: 28 mg/dL — AB (ref 6–20)
CALCIUM: 7.9 mg/dL — AB (ref 8.9–10.3)
CHLORIDE: 111 mmol/L (ref 101–111)
CO2: 24 mmol/L (ref 22–32)
CREATININE: 0.93 mg/dL (ref 0.61–1.24)
GFR calc Af Amer: >60 mL/min (ref >60)
GLUCOSE: 94 mg/dL (ref 65–99)
Potassium: 4.2 mmol/L (ref 3.5–5.1)
SODIUM: 141 mmol/L (ref 135–145)
Total Bilirubin: 1.4 mg/dL — ABNORMAL HIGH (ref 0.3–1.2)
Total Protein: 5.2 g/dL — ABNORMAL LOW (ref 6.5–8.1)

## 2015-01-18 LAB — LIPID PANEL
CHOLESTEROL: 143 mg/dL (ref 0–200)
HDL: 20 mg/dL — ABNORMAL LOW (ref >40)
LDL CALC: 93 mg/dL (ref 0–99)
Total CHOL/HDL Ratio: 7.2 RATIO
Triglycerides: 149 mg/dL (ref <150)
VLDL: 30 mg/dL (ref 0–40)

## 2015-01-18 LAB — MRSA PCR SCREENING: MRSA BY PCR: NEGATIVE

## 2015-01-18 LAB — PROTIME-INR
INR: 1.14 (ref 0.00–1.49)
Prothrombin Time: 14.8 seconds (ref 11.6–15.2)

## 2015-01-18 LAB — GLUCOSE, CAPILLARY: GLUCOSE-CAPILLARY: 91 mg/dL (ref 65–99)

## 2015-01-18 LAB — HIV ANTIBODY (ROUTINE TESTING W REFLEX): HIV Screen 4th Generation wRfx: NONREACTIVE

## 2015-01-18 LAB — PREPARE RBC (CROSSMATCH)

## 2015-01-18 SURGERY — EGD (ESOPHAGOGASTRODUODENOSCOPY)
Anesthesia: Moderate Sedation | Laterality: Left

## 2015-01-18 MED ORDER — DIPHENHYDRAMINE HCL 50 MG/ML IJ SOLN
INTRAMUSCULAR | Status: DC | PRN
  Administered 2015-01-18: 25 mg via INTRAVENOUS

## 2015-01-18 MED ORDER — SODIUM CHLORIDE 0.9 % IV SOLN
80.0000 mg | Freq: Once | INTRAVENOUS | Status: DC
  Filled 2015-01-18: qty 80

## 2015-01-18 MED ORDER — BUTAMBEN-TETRACAINE-BENZOCAINE 2-2-14 % EX AERO
INHALATION_SPRAY | CUTANEOUS | Status: DC | PRN
  Administered 2015-01-18: 2 via TOPICAL

## 2015-01-18 MED ORDER — SODIUM CHLORIDE 0.9 % IV SOLN
Freq: Once | INTRAVENOUS | Status: AC
  Administered 2015-01-18: 03:00:00 via INTRAVENOUS

## 2015-01-18 MED ORDER — PANTOPRAZOLE SODIUM 40 MG IV SOLR
40.0000 mg | Freq: Two times a day (BID) | INTRAVENOUS | Status: DC
  Administered 2015-01-22 (×2): 40 mg via INTRAVENOUS
  Filled 2015-01-18 (×2): qty 40

## 2015-01-18 MED ORDER — MIDAZOLAM HCL 5 MG/ML IJ SOLN
INTRAMUSCULAR | Status: AC
  Filled 2015-01-18: qty 2

## 2015-01-18 MED ORDER — SODIUM CHLORIDE 0.9 % IV SOLN
8.0000 mg/h | INTRAVENOUS | Status: AC
  Administered 2015-01-18 – 2015-01-21 (×7): 8 mg/h via INTRAVENOUS
  Filled 2015-01-18 (×14): qty 80

## 2015-01-18 MED ORDER — DIPHENHYDRAMINE HCL 50 MG/ML IJ SOLN
INTRAMUSCULAR | Status: AC
  Filled 2015-01-18: qty 1

## 2015-01-18 MED ORDER — FENTANYL CITRATE (PF) 100 MCG/2ML IJ SOLN
INTRAMUSCULAR | Status: DC | PRN
  Administered 2015-01-18 (×3): 25 ug via INTRAVENOUS

## 2015-01-18 MED ORDER — MIDAZOLAM HCL 10 MG/2ML IJ SOLN
INTRAMUSCULAR | Status: DC | PRN
  Administered 2015-01-18: 2 mg via INTRAVENOUS
  Administered 2015-01-18: 1 mg via INTRAVENOUS
  Administered 2015-01-18: 2 mg via INTRAVENOUS
  Administered 2015-01-18: 1 mg via INTRAVENOUS

## 2015-01-18 MED ORDER — FENTANYL CITRATE (PF) 100 MCG/2ML IJ SOLN
INTRAMUSCULAR | Status: AC
  Filled 2015-01-18: qty 2

## 2015-01-18 NOTE — Evaluation (Signed)
Physical Therapy Evaluation Patient Details Name: [name] MRN: [id number] DOB: December 25, 1950 Today's Date: 01/18/2015   History of Present Illness  [name] is an 64 y.o. black male who presents with 2 days of melenic stools, vague dyspepsia and 2 brief syncopal episodes. He presented with a hemoglobin of 4.5. He has been transfused 4 units packed red blood cells and has been hemodynamically stable with no stool since admission yesterday. He denies any history of GI bleeding peptic ulcer disease having alcohol use or significant NSAID use  Clinical Impression  Pt admitted with above diagnosis. Pt currently with functional limitations due to the deficits listed below (see PT Problem List). Pt did well overall without significant LOB.  Should progress well.  Will f/u to ensure pt is mobilizing.  Pt will benefit from skilled PT to increase their independence and safety with mobility to allow discharge to the venue listed below.      Follow Up Recommendations No PT follow up    Equipment Recommendations  None recommended by PT    Recommendations for Other Services       Precautions / Restrictions Precautions Precautions: None Restrictions Weight Bearing Restrictions: No      Mobility  Bed Mobility Overal bed mobility: Independent                Transfers Overall transfer level: Independent                  Ambulation/Gait Ambulation/Gait assistance: Supervision Ambulation Distance (Feet): 450 Feet Assistive device: None Gait Pattern/deviations: WFL(Within Functional Limits)   Gait velocity interpretation: at or above normal speed for age/gender General Gait Details: Pt able to ambulate without LOB.  Overall steady.  Encouraged to ask nurse to walk with him several times a day.   Stairs Stairs: Yes Stairs assistance: Supervision Stair Management: Alternating pattern;No rails;Forwards Number of Stairs: 5 General stair comments: No difficulties on  steps  Wheelchair Mobility    Modified Rankin (Stroke Patients Only)       Balance Overall balance assessment: Independent                                           Pertinent Vitals/Pain Pain Assessment: No/denies pain  76bpm-94bpm with activity, 100% RA, 124/73    Home Living Family/patient expects to be discharged to:: Private residence Living Arrangements: Spouse/significant other Available Help at Discharge: Family;Available 24 hours/day Type of Home: House Home Access: Stairs to enter Entrance Stairs-Rails: None (plans to put a rail in as wife with RA) Entrance Stairs-Number of Steps: 4 Home Layout: One level Home Equipment: None Additional Comments: Wife has RA and is retired per pt.  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  Pt also reports he goes to gym 3-4 x week.      Prior Function Level of Independence: Independent               Hand Dominance        Extremity/Trunk Assessment   Upper Extremity Assessment: Defer to OT evaluation           Lower Extremity Assessment: Overall WFL for tasks assessed      Cervical / Trunk Assessment: Normal  Communication   Communication: No difficulties  Cognition Arousal/Alertness: Awake/alert Behavior During Therapy: WFL for tasks assessed/performed Overall Cognitive Status: Within Functional Limits for tasks assessed  General Comments      Exercises General Exercises - Lower Extremity Ankle Circles/Pumps: AROM;Both;10 reps;Supine Long Arc Quad: AROM;Both;15 reps;Seated Hip Flexion/Marching: AROM;Both;10 reps;Seated      Assessment/Plan    PT Assessment Patient needs continued PT services  PT Diagnosis Generalized weakness   PT Problem List Decreased activity tolerance;Decreased mobility  PT Treatment Interventions Gait training;Functional mobility training;Therapeutic activities;Therapeutic exercise;Patient/family education   PT Goals (Current  goals can be found in the Care Plan section) Acute Rehab PT Goals Patient Stated Goal: to go home PT Goal Formulation: With patient Time For Goal Achievement: 01/25/15 Potential to Achieve Goals: Good    Frequency Min 2X/week   Barriers to discharge        Co-evaluation               End of Session Equipment Utilized During Treatment: Gait belt Activity Tolerance: Patient tolerated treatment well Patient left: in chair;with call bell/phone within reach Nurse Communication: Mobility status         Time: [id number] PT Time Calculation (min) (ACUTE ONLY): 20 min   Charges:   PT Evaluation $Initial PT Evaluation Tier I: 1 Procedure     PT G CodesDenice Paradise 01-19-2015, 10:31 AM Amanda [facility] Acute Rehabilitation [phone] (pager)

## 2015-01-18 NOTE — Progress Notes (Signed)
Blood administration:  Unit [id number] started 01/17/15 at 2130, stopped at 2343. Unit [id number] started at 01/18/15 at 0000.

## 2015-01-18 NOTE — Interval H&P Note (Signed)
History and Physical Interval Note:  01/18/2015 12:53 PM  [name]  has presented today for surgery, with the diagnosis of Melena  The various methods of treatment have been discussed with the patient and family. After consideration of risks, benefits and other options for treatment, the patient has consented to  Procedure(s): ESOPHAGOGASTRODUODENOSCOPY (EGD) (Left) as a surgical intervention .  The patient's history has been reviewed, patient examined, no change in status, stable for surgery.  I have reviewed the patient's chart and labs.  Questions were answered to the patient's satisfaction.     [place] C.

## 2015-01-18 NOTE — Progress Notes (Signed)
[facility] PROGRESS NOTE  [name] [id number] DOB: 01/01/1951 DOA: 01/17/2015  PCP: No primary care provider on file.  Brief HPI: 64 year old African-American male with a past medical history of hypertension, hyperlipidemia, medication noncompliance, presented with black stools, dizziness and syncopal episodes. Patient was found to be profoundly anemic. He had heme positive stools. He was hospitalized for further management.  Past medical history:  Past Medical History  Diagnosis Date  . Essential hypertension   . HLD (hyperlipidemia)     Consultants: [facility]  Procedures: EGD is pending  Antibiotics: None  Subjective: Patient denies any complaints this morning. He hasn't had any further episodes of black stool. Denies any nausea or vomiting. No blood in the stool either. Denies any previous upper endoscopies or colonoscopies. Denies high amounts of NSAID use.  Objective: Vital Signs  Filed Vitals:   01/18/15 0602 01/18/15 0700 01/18/15 0816 01/18/15 1000  BP: 117/74 123/74 139/75 115/69  Pulse:   77   Temp: 98.1 F (36.7 C) 98.3 F (36.8 C) 98.2 F (36.8 C)   TempSrc: Oral Oral Oral   Resp: 16 18  24   Height:      Weight:      SpO2: 100% 99%  99%    Intake/Output Summary (Last 24 hours) at 01/18/15 1150 Last data filed at 01/18/15 0816  Gross per 24 hour  Intake 2272.33 ml  Output   2125 ml  Net 147.33 ml   Filed Weights   01/17/15 1614 01/17/15 2130  Weight: 92.987 kg (205 lb) 92.9 kg (204 lb 12.9 oz)    General appearance: alert, cooperative, appears stated age and no distress Resp: clear to auscultation bilaterally Cardio: regular rate and rhythm, S1, S2 normal, no murmur, click, rub or gallop GI: soft, non-tender; bowel sounds normal; no masses,  no organomegaly Extremities: extremities normal, atraumatic, no cyanosis or edema Neurologic: Alert and oriented 3. No focal neurological deficits.  Lab  Results:  Basic Metabolic Panel:  Recent Labs Lab 01/17/15 1740 01/18/15 0941  NA 139 141  K 4.1 4.2  CL 102 111  CO2 28 24  GLUCOSE 122* 94  BUN 43* 28*  CREATININE 1.22 0.93  CALCIUM 7.9* 7.9*   Liver Function Tests:  Recent Labs Lab 01/17/15 1740 01/18/15 0941  AST 38 42*  ALT 97* 84*  ALKPHOS 82 73  BILITOT 0.3 1.4*  PROT 5.5* 5.2*  ALBUMIN 2.7* 2.6*   CBC:  Recent Labs Lab 01/17/15 1740 01/17/15 2119 01/18/15 0941  WBC 9.6 9.1 11.0*  NEUTROABS 7.6  --   --   HGB 5.1* 4.5* 8.4*  HCT 16.0* 14.8* 25.0*  MCV 80.4 82.2 83.6  PLT 343 295 253   CBG:  Recent Labs Lab 01/18/15 0759  GLUCAP 91    Recent Results (from the past 240 hour(s))  MRSA PCR Screening     Status: None   Collection Time: 01/17/15  9:07 PM  Result Value Ref Range Status   MRSA by PCR NEGATIVE NEGATIVE Final    Comment:        The GeneXpert MRSA Assay (FDA approved for NASAL specimens only), is one component of a comprehensive MRSA colonization surveillance program. It is not intended to diagnose MRSA infection nor to guide or monitor treatment for MRSA infections.       Studies/Results: No results found.  Medications:  Scheduled: . pantoprazole (PROTONIX) IV  40 mg Intravenous Q12H  . sodium chloride  3 mL Intravenous Q12H  Continuous: . sodium chloride 100 mL/hr at 01/18/15 [id number]   [id number] carbonate, hydrALAZINE, hydrOXYzine, morphine injection  Assessment/Plan:  Principal Problem:   GIB (gastrointestinal bleeding) Active Problems:   Essential hypertension   Anemia   HLD (hyperlipidemia)    GI bleed This is most likely upper GI bleed. Patient is on Protonix. Gastroenterology has been consulted. Patient will likely need upper endoscopy. Continue to monitor closely.  Acute blood loss anemia Patient has been transfused 4 units of blood. Hemoglobin has responded appropriately. Continue to monitor closely.  Abnormal LFTs Mildly elevated  transaminases. Etiology is unclear. Repeat in the morning. Check hepatitis panel. Will need outpatient follow-up.  History of essential hypertension Blood pressure is reasonably well controlled here. He hasn't taken any antihypertensives in 6 months.   DVT Prophylaxis: SCDs    Code Status: Full code  Family Communication: Discussed with the patient  Disposition Plan: Await GI input.    LOS: 1 day   [facility] Pager [phone] 01/18/2015, 11:50 AM  If 7PM-7AM, please contact night-coverage at [email or web address], password [facility]

## 2015-01-18 NOTE — BH Specialist Note (Signed)
[facility] Consult Note  Referring Provider: No ref. provider found Primary Care Physician:  No primary care provider on file. Primary Gastroenterologist:  Dr.  Laurel Dimmer Complaint: Melena HPI: [name] is an 64 y.o. black male  who presents with 2 days of melenic stools, vague dyspepsia and 2 brief syncopal episodes. He presented with a hemoglobin of 4.5. He has been transfused 4 units packed red blood cells and has been hemodynamically stable with no stool since admission yesterday. He denies any history of GI bleeding peptic ulcer disease having alcohol use or significant NSAID use.  Past Medical History  Diagnosis Date  . Essential hypertension   . HLD (hyperlipidemia)     Past Surgical History  Procedure Laterality Date  . Skin surgery      Small benign cysts over left scalp removed    Medications Prior to Admission  Medication Sig Dispense Refill  . calcium carbonate (TUMS - DOSED IN MG ELEMENTAL CALCIUM) 500 MG chewable tablet Chew 1 tablet by mouth daily as needed for indigestion or heartburn.    Marland Kitchen lisinopril-hydrochlorothiazide (PRINZIDE,ZESTORETIC) 20-25 MG tablet Take 1 tablet by mouth daily.    Marland Kitchen lovastatin (MEVACOR) 20 MG tablet Take 20 mg by mouth at bedtime.    . sodium-potassium bicarbonate (ALKA-SELTZER GOLD) TBEF dissolvable tablet Take 1 tablet by mouth daily as needed.      Allergies: No Known Allergies  Family History  Problem Relation Age of Onset  . Hypertension Mother   . Hypertension Father   . Hypertension Sister   . Diabetes Sister   . Prostate cancer Brother   . Lupus Sister   . Kidney failure Father     Social History:  reports that he has never smoked. He does not have any smokeless tobacco history on file. He reports that he does not drink alcohol. His drug history is not on file.  Review of Systems: negative except as above   Blood pressure 139/75, pulse 77, temperature 98.2 F (36.8 C), temperature source Oral, resp.  rate 18, height '5\' 10"'$  (1.778 m), weight 92.9 kg (204 lb 12.9 oz), SpO2 99 %. Head: Normocephalic, without obvious abnormality, atraumatic Neck: no adenopathy, no carotid bruit, no JVD, supple, symmetrical, trachea midline and thyroid not enlarged, symmetric, no tenderness/mass/nodules Resp: clear to auscultation bilaterally Cardio: regular rate and rhythm, S1, S2 normal, no murmur, click, rub or gallop GI: Abdomen soft nondistended with normoactive bowel sounds. hepatomegaly mass or guarding. Extremities: extremities normal, atraumatic, no cyanosis or edema  Results for orders placed or performed during the hospital encounter of 01/17/15 (from the past 48 hour(s))  POC occult blood, ED     Status: Abnormal   Collection Time: 01/17/15  4:49 PM  Result Value Ref Range   Fecal Occult Bld POSITIVE (A) NEGATIVE  CBC with Differential     Status: Abnormal   Collection Time: 01/17/15  5:40 PM  Result Value Ref Range   WBC 9.6 4.0 - 10.5 K/uL   RBC 1.99 (L) 4.22 - 5.81 MIL/uL   Hemoglobin 5.1 (LL) 13.0 - 17.0 g/dL    Comment: REPEATED TO VERIFY CRITICAL RESULT CALLED TO, READ BACK BY AND VERIFIED WITH: C CARLON,RN 1805 01/17/2015 WBOND CORRECTED ON 12/14 AT 2225: PREVIOUSLY REPORTED AS REPEATED TO VERIFY SPECIMEN CHECKED FOR CLOTS CRITICAL RESULT CALLED TO, READ BACK BY AND VERIFIED WITH: C CARLON,RN 1805 01/17/2015 WBOND    HCT 16.0 (L) 39.0 - 52.0 %   MCV 80.4 78.0 - 100.0 fL  MCH 25.6 (L) 26.0 - 34.0 pg   MCHC 31.9 30.0 - 36.0 g/dL   RDW 14.2 11.5 - 15.5 %   Platelets 343 150 - 400 K/uL   Neutrophils Relative % 79 %   Neutro Abs 7.6 1.7 - 7.7 K/uL   Lymphocytes Relative 15 %   Lymphs Abs 1.5 0.7 - 4.0 K/uL   Monocytes Relative 5 %   Monocytes Absolute 0.5 0.1 - 1.0 K/uL   Eosinophils Relative 1 %   Eosinophils Absolute 0.1 0.0 - 0.7 K/uL   Basophils Relative 0 %   Basophils Absolute 0.0 0.0 - 0.1 K/uL  Comprehensive metabolic panel     Status: Abnormal   Collection Time:  01/17/15  5:40 PM  Result Value Ref Range   Sodium 139 135 - 145 mmol/L   Potassium 4.1 3.5 - 5.1 mmol/L   Chloride 102 101 - 111 mmol/L   CO2 28 22 - 32 mmol/L   Glucose, Bld 122 (H) 65 - 99 mg/dL   BUN 43 (H) 6 - 20 mg/dL   Creatinine, Ser 1.22 0.61 - 1.24 mg/dL   Calcium 7.9 (L) 8.9 - 10.3 mg/dL   Total Protein 5.5 (L) 6.5 - 8.1 g/dL   Albumin 2.7 (L) 3.5 - 5.0 g/dL   AST 38 15 - 41 U/L   ALT 97 (H) 17 - 63 U/L   Alkaline Phosphatase 82 38 - 126 U/L   Total Bilirubin 0.3 0.3 - 1.2 mg/dL   GFR calc non Af Amer >60 >60 mL/min   GFR calc Af Amer >60 >60 mL/min    Comment: (NOTE) The eGFR has been calculated using the CKD EPI equation. This calculation has not been validated in all clinical situations. eGFR's persistently <60 mL/min signify possible Chronic Kidney Disease.    Anion gap 9 5 - 15  Type and screen     Status: None (Preliminary result)   Collection Time: 01/17/15  5:40 PM  Result Value Ref Range   ABO/RH(D) AB POS    Antibody Screen NEG    Sample Expiration 01/20/2015    Unit Number [id number]    Blood Component Type RED CELLS,LR    Unit division 00    Status of Unit ISSUED,FINAL    Transfusion Status OK TO TRANSFUSE    Crossmatch Result Compatible    Unit Number [id number]    Blood Component Type RED CELLS,LR    Unit division 00    Status of Unit ISSUED,FINAL    Transfusion Status OK TO TRANSFUSE    Crossmatch Result Compatible    Unit Number [id number]    Blood Component Type RED CELLS,LR    Unit division 00    Status of Unit ISSUED    Transfusion Status OK TO TRANSFUSE    Crossmatch Result Compatible    Unit Number [id number]    Blood Component Type RED CELLS,LR    Unit division 00    Status of Unit ISSUED    Transfusion Status OK TO TRANSFUSE    Crossmatch Result Compatible   ABO/Rh     Status: None   Collection Time: 01/17/15  5:40 PM  Result Value Ref Range   ABO/RH(D) AB POS   Prepare RBC     Status: None   Collection Time:  01/17/15  6:30 PM  Result Value Ref Range   Order Confirmation ORDER PROCESSED BY BLOOD BANK   Protime-INR     Status: Abnormal   Collection Time: 01/17/15  7:28 PM  Result Value Ref Range   Prothrombin Time 15.4 (H) 11.6 - 15.2 seconds   INR 1.20 0.00 - 1.49  Urine rapid drug screen (hosp performed)     Status: None   Collection Time: 01/17/15  8:41 PM  Result Value Ref Range   Opiates NONE DETECTED NONE DETECTED   Cocaine NONE DETECTED NONE DETECTED   Benzodiazepines NONE DETECTED NONE DETECTED   Amphetamines NONE DETECTED NONE DETECTED   Tetrahydrocannabinol NONE DETECTED NONE DETECTED   Barbiturates NONE DETECTED NONE DETECTED    Comment:        DRUG SCREEN FOR MEDICAL PURPOSES ONLY.  IF CONFIRMATION IS NEEDED FOR ANY PURPOSE, NOTIFY LAB WITHIN 5 DAYS.        LOWEST DETECTABLE LIMITS FOR URINE DRUG SCREEN Drug Class       Cutoff (ng/mL) Amphetamine      1000 Barbiturate      200 Benzodiazepine   332 Tricyclics       951 Opiates          300 Cocaine          300 THC              50   MRSA PCR Screening     Status: None   Collection Time: 01/17/15  9:07 PM  Result Value Ref Range   MRSA by PCR NEGATIVE NEGATIVE    Comment:        The GeneXpert MRSA Assay (FDA approved for NASAL specimens only), is one component of a comprehensive MRSA colonization surveillance program. It is not intended to diagnose MRSA infection nor to guide or monitor treatment for MRSA infections.   CBC     Status: Abnormal   Collection Time: 01/17/15  9:19 PM  Result Value Ref Range   WBC 9.1 4.0 - 10.5 K/uL   RBC 1.80 (L) 4.22 - 5.81 MIL/uL   Hemoglobin 4.5 (LL) 13.0 - 17.0 g/dL    Comment: REPEATED TO VERIFY CRITICAL RESULT CALLED TO, READ BACK BY AND VERIFIED WITH: B REAP,RN [phone] [id number]    HCT 14.8 (L) 39.0 - 52.0 %   MCV 82.2 78.0 - 100.0 fL   MCH 25.0 (L) 26.0 - 34.0 pg   MCHC 30.4 30.0 - 36.0 g/dL   RDW 14.1 11.5 - 15.5 %   Platelets 295 150 - 400 K/uL  APTT      Status: None   Collection Time: 01/17/15  9:19 PM  Result Value Ref Range   aPTT 31 24 - 37 seconds  Prepare RBC     Status: None   Collection Time: 01/18/15  1:30 AM  Result Value Ref Range   Order Confirmation ORDER PROCESSED BY BLOOD BANK   Glucose, capillary     Status: None   Collection Time: 01/18/15  7:59 AM  Result Value Ref Range   Glucose-Capillary 91 65 - 99 mg/dL   No results found.  Assessment: Upper GI bleeding, initially unstable, better after transfusion. Plan:  Will proceed with EGD today. Kynley Metzger C 01/18/2015, 9:50 AM  Pager [phone] If no answer or after 5 PM call [phone]

## 2015-01-18 NOTE — H&P (View-Only) (Signed)
PRELIM. NOTE--no charge  We have been notified of the consult request on this pt w/ GIB.  Spoke w/ nurse--pt has not had a dark stool since adm and is in good spirits,, w/ h/o dizziness when standing up.  Elevated BUN noted, c/w recent UGIB.  Profound anemia noted--hgb 4.5, has just finished first unit of prc's and another one will be started shortly.  I am available tonight in the event of abrupt destabilization or active overt bleeding, but otherwise, we will plan to see pt in the morning.  In the absence of active bleeding, we feel it would be safer to do egd after his hgb has improved.  Call anytime in the event of active bleeding or if any questions.  Cleotis Nipper, M.D. Pager [phone] If no answer or after 5 PM call [phone]

## 2015-01-18 NOTE — Op Note (Signed)
[facility] [place], [id number]   ENDOSCOPY PROCEDURE REPORT  PATIENT: [name], [name]  MR#: [id number] BIRTHDATE: [birth year] , 64  yrs. old GENDER: male ENDOSCOPIST: Wilford Corner, MD REFERRED BY: PROCEDURE DATE:  01/28/2015 PROCEDURE:  EGD, diagnostic ASA CLASS:     Class II INDICATIONS:  GI bleed. MEDICATIONS: Fentanyl 75 mcg IV, Versed 6 mg IV, and Benadryl 25 mg IV TOPICAL ANESTHETIC: Cetacaine Spray X 2  DESCRIPTION OF PROCEDURE: After the risks benefits and alternatives of the procedure were thoroughly explained, informed consent was obtained.  The Pentax Gastroscope [id number] endoscope was introduced through the mouth and advanced to the second portion of the duodenum , Without limitations.  The instrument was slowly withdrawn as the mucosa was fully examined. Estimated blood loss is zero unless otherwise noted in this procedure report.    Esophagus normal and GEJ 42 cm from the incisors. Pyloric channel of stomach edematous and stomach o/w normal. Massive duodenal bulb ulcer seen that was cratered and an adherent clot was seen. Ulcer covered 3/4 of the bulb lumen. Endoscope not advanced distal to this ulcer. Pyloric channel was ulcerated and friable. Retroflexed views revealed no abnormalities.     The scope was then withdrawn from the patient and the procedure completed.  COMPLICATIONS: There were no immediate complications.  ENDOSCOPIC IMPRESSION:     Massive Duodenal Bulb Ulcer with adherent clot - high risk for rebleeding Ulcerated pyloric channel  RECOMMENDATIONS:     Change to Protonix drip; If rebleeding occurs, then needs IR for embolization and if not successful will need surgery; Keep NPO except ice chips and sips of clears; Follow H/Hs   eSigned:  Wilford Corner, MD Jan 28, 2015 2:24 PM    CC:  CPT CODES: ICD CODES:  The ICD and CPT codes recommended by this software are interpretations from the  data that the clinical staff has captured with the software.  The verification of the translation of this report to the ICD and CPT codes and modifiers is the sole responsibility of the health care institution and practicing physician where this report was generated.  [employer] will not be held responsible for the validity of the ICD and CPT codes included on this report.  AMA assumes no liability for data contained or not contained herein. CPT is a Designer, television/film set of the [employer].  PATIENT NAME:  [name], [name] MR#: [id number]

## 2015-01-18 NOTE — Progress Notes (Signed)
PRELIM. NOTE--no charge  We have been notified of the consult request on this pt w/ GIB.  Spoke w/ nurse--pt has not had a dark stool since adm and is in good spirits,, w/ h/o dizziness when standing up.  Elevated BUN noted, c/w recent UGIB.  Profound anemia noted--hgb 4.5, has just finished first unit of prc's and another one will be started shortly.  I am available tonight in the event of abrupt destabilization or active overt bleeding, but otherwise, we will plan to see pt in the morning.  In the absence of active bleeding, we feel it would be safer to do egd after his hgb has improved.  Call anytime in the event of active bleeding or if any questions.  Cleotis Nipper, M.D. Pager [phone] If no answer or after 5 PM call [phone]

## 2015-01-18 NOTE — Brief Op Note (Addendum)
Massive duodenal bulb ulcer with adherent clot. No active bleeding seen but high risk for rebleeding. See endopro for details/recs. Will change to Protonix drip. Keep NPO except for ice chips and sips of water. If rebleeding occurs, then will need IR for embolization and if that is not successful will need surgery. D/W Dr. [place] Pink.

## 2015-01-19 ENCOUNTER — Inpatient Hospital Stay (HOSPITAL_COMMUNITY): Payer: BLUE CROSS/BLUE SHIELD

## 2015-01-19 ENCOUNTER — Encounter (HOSPITAL_COMMUNITY): Payer: Self-pay | Admitting: Gastroenterology

## 2015-01-19 DIAGNOSIS — R740 Nonspecific elevation of levels of transaminase and lactic acid dehydrogenase [LDH]: Secondary | ICD-10-CM

## 2015-01-19 LAB — COMPREHENSIVE METABOLIC PANEL
ALBUMIN: 2.4 g/dL — AB (ref 3.5–5.0)
ALT: 73 U/L — ABNORMAL HIGH (ref 17–63)
ANION GAP: 5 (ref 5–15)
AST: 35 U/L (ref 15–41)
Alkaline Phosphatase: 62 U/L (ref 38–126)
BUN: 18 mg/dL (ref 6–20)
CO2: 23 mmol/L (ref 22–32)
Calcium: 7.6 mg/dL — ABNORMAL LOW (ref 8.9–10.3)
Chloride: 110 mmol/L (ref 101–111)
Creatinine, Ser: 0.97 mg/dL (ref 0.61–1.24)
GFR calc Af Amer: >60 mL/min (ref >60)
GFR calc non Af Amer: >60 mL/min (ref >60)
GLUCOSE: 84 mg/dL (ref 65–99)
POTASSIUM: 3.8 mmol/L (ref 3.5–5.1)
SODIUM: 138 mmol/L (ref 135–145)
Total Bilirubin: 0.8 mg/dL (ref 0.3–1.2)
Total Protein: 5.1 g/dL — ABNORMAL LOW (ref 6.5–8.1)

## 2015-01-19 LAB — TYPE AND SCREEN
ABO/RH(D): AB POS
Antibody Screen: NEGATIVE
UNIT DIVISION: 0
UNIT DIVISION: 0
Unit division: 0
Unit division: 0

## 2015-01-19 LAB — CBC
HCT: 23.8 % — ABNORMAL LOW (ref 39.0–52.0)
HEMATOCRIT: 27.4 % — AB (ref 39.0–52.0)
HEMOGLOBIN: 8.7 g/dL — AB (ref 13.0–17.0)
Hemoglobin: 8 g/dL — ABNORMAL LOW (ref 13.0–17.0)
MCH: 28 pg (ref 26.0–34.0)
MCH: 26.7 pg (ref 26.0–34.0)
MCHC: 33.6 g/dL (ref 30.0–36.0)
MCHC: 31.8 g/dL (ref 30.0–36.0)
MCV: 83.2 fL (ref 78.0–100.0)
MCV: 84 fL (ref 78.0–100.0)
PLATELETS: 237 10*3/uL (ref 150–400)
PLATELETS: 369 10*3/uL (ref 150–400)
RBC: 2.86 MIL/uL — ABNORMAL LOW (ref 4.22–5.81)
RBC: 3.26 MIL/uL — AB (ref 4.22–5.81)
RDW: 15.1 % (ref 11.5–15.5)
RDW: 15.1 % (ref 11.5–15.5)
WBC: 7.5 10*3/uL (ref 4.0–10.5)
WBC: 8.8 10*3/uL (ref 4.0–10.5)

## 2015-01-19 LAB — HEPATITIS PANEL, ACUTE
HEP A IGM: NEGATIVE
HEP B C IGM: NEGATIVE
HEP B S AG: NEGATIVE
HEP B S AG: NEGATIVE
Hep A IgM: NEGATIVE
Hep B C IgM: NEGATIVE

## 2015-01-19 LAB — GLUCOSE, CAPILLARY: Glucose-Capillary: 71 mg/dL (ref 65–99)

## 2015-01-19 NOTE — Progress Notes (Signed)
Patient ID: [name], male   DOB: 01-31-1951, 64 y.o.   MRN: [id number] [facility] Progress Note  [name] 64 y.o. December 30, 1950   Subjective: No BMs or bleeding overnight. Denies abdominal pain.Feels fine. Reports daily Alka-Seltzer recently but denies high doses of it. Denies alcohol use.  Objective: Vital signs in last 24 hours: Filed Vitals:   01/19/15 0400 01/19/15 0808  BP: 129/74 136/97  Pulse:  85  Temp: 98 F (36.7 C) 97.9 F (36.6 C)  Resp:  20    Physical Exam: Gen: alert, no acute distress HEENT: anicteric sclera CV: RRR Chest: CTA B Abd: soft, nontender, nondistended, +BS  Lab Results:  Recent Labs  01/18/15 0941 01/19/15 0235  NA 141 138  K 4.2 3.8  CL 111 110  CO2 24 23  GLUCOSE 94 84  BUN 28* 18  CREATININE 0.93 0.97  CALCIUM 7.9* 7.6*    Recent Labs  01/18/15 0941 01/19/15 0235  AST 42* 35  ALT 84* 73*  ALKPHOS 73 62  BILITOT 1.4* 0.8  PROT 5.2* 5.1*  ALBUMIN 2.6* 2.4*    Recent Labs  01/17/15 1740  01/18/15 2043 01/19/15 0235  WBC 9.6  < > 7.9 7.5  NEUTROABS 7.6  --   --   --   HGB 5.1*  < > 7.8* 8.0*  HCT 16.0*  < > 23.2* 23.8*  MCV 80.4  < > 83.2 83.2  PLT 343  < > 246 237  < > = values in this interval not displayed.  Recent Labs  01/17/15 1928 01/18/15 0941  LABPROT 15.4* 14.8  INR 1.20 1.14      Assessment/Plan: S/P Duodenal ulcer bleed - high risk of rebleeding; Continue Protonix drip; Change to clear liquid diet and do not advance until tomorrow if doing ok. Continue Protonix drip today and tomorrow and change to IV Q 12 hours on 01/21/15. Follow H/H. H. Pylori serology pending. Dr. Penelope Coop to see in f/u tomorrow.   [place] C. 01/19/2015, 8:51 AM  Pager [phone]  If no answer or after 5 PM call [phone]

## 2015-01-19 NOTE — Progress Notes (Signed)
[facility] PROGRESS NOTE  ULES BENSON [id number] DOB: 1950-12-30 DOA: 01/17/2015  PCP: No primary care provider on file.  Brief HPI: 64 year old African-American male with a past medical history of hypertension, hyperlipidemia, medication noncompliance, presented with black stools, dizziness and syncopal episodes. Patient was found to be profoundly anemic. He had heme positive stools. He was hospitalized for further management.  Past medical history:  Past Medical History  Diagnosis Date  . Essential hypertension   . HLD (hyperlipidemia)     Consultants: [facility]  Procedures:  EGD ENDOSCOPIC IMPRESSION: Massive Duodenal Bulb Ulcer with adherent clot - high risk for rebleeding. Ulcerated pyloric channel  Antibiotics: None  Subjective: Patient feels well. Denies any complaints. No bowel movements. No bleeding that he has noticed. Denies any abdominal pain, nausea, vomiting.   Objective: Vital Signs  Filed Vitals:   01/18/15 1915 01/18/15 2000 01/19/15 0001 01/19/15 0400  BP: 126/71 114/71 106/68 129/74  Pulse:   81   Temp:  98 F (36.7 C) 98 F (36.7 C) 98 F (36.7 C)  TempSrc:  Oral Oral Oral  Resp: 16 16 20    Height:      Weight:      SpO2: 98% 98% 99% 99%    Intake/Output Summary (Last 24 hours) at 01/19/15 0742 Last data filed at 01/19/15 [id number]  Gross per 24 hour  Intake   1535 ml  Output   1750 ml  Net   -215 ml   Filed Weights   01/17/15 1614 01/17/15 2130 01/18/15 1204  Weight: 92.987 kg (205 lb) 92.9 kg (204 lb 12.9 oz) 92.534 kg (204 lb)    General appearance: alert, cooperative, appears stated age and no distress Resp: clear to auscultation bilaterally Cardio: regular rate and rhythm, S1, S2 normal, no murmur, click, rub or gallop GI: soft, non-tender; bowel sounds normal; no masses,  no organomegaly Neurologic: Alert and oriented 3. No focal neurological deficits.  Lab Results:  Basic Metabolic  Panel:  Recent Labs Lab 01/17/15 1740 01/18/15 0941 01/19/15 0235  NA 139 141 138  K 4.1 4.2 3.8  CL 102 111 110  CO2 28 24 23   GLUCOSE 122* 94 84  BUN 43* 28* 18  CREATININE 1.22 0.93 0.97  CALCIUM 7.9* 7.9* 7.6*   Liver Function Tests:  Recent Labs Lab 01/17/15 1740 01/18/15 0941 01/19/15 0235  AST 38 42* 35  ALT 97* 84* 73*  ALKPHOS 82 73 62  BILITOT 0.3 1.4* 0.8  PROT 5.5* 5.2* 5.1*  ALBUMIN 2.7* 2.6* 2.4*   CBC:  Recent Labs Lab 01/17/15 1740 01/17/15 2119 01/18/15 0941 01/18/15 1645 01/18/15 2043 01/19/15 0235  WBC 9.6 9.1 11.0* 9.5 7.9 7.5  NEUTROABS 7.6  --   --   --   --   --   HGB 5.1* 4.5* 8.4* 8.2* 7.8* 8.0*  HCT 16.0* 14.8* 25.0* 25.3* 23.2* 23.8*  MCV 80.4 82.2 83.6 83.8 83.2 83.2  PLT 343 295 253 280 246 237   CBG:  Recent Labs Lab 01/18/15 0759  GLUCAP 91    Recent Results (from the past 240 hour(s))  MRSA PCR Screening     Status: None   Collection Time: 01/17/15  9:07 PM  Result Value Ref Range Status   MRSA by PCR NEGATIVE NEGATIVE Final    Comment:        The GeneXpert MRSA Assay (FDA approved for NASAL specimens only), is one component of a comprehensive MRSA colonization surveillance program. It is  not intended to diagnose MRSA infection nor to guide or monitor treatment for MRSA infections.       Studies/Results: No results found.  Medications:  Scheduled: . pantoprazole (PROTONIX) IV  80 mg Intravenous Once  . [START ON 01/22/2015] pantoprazole (PROTONIX) IV  40 mg Intravenous Q12H  . sodium chloride  3 mL Intravenous Q12H   Continuous: . sodium chloride 100 mL/hr at 01/19/15 0543  . pantoprozole (PROTONIX) infusion 8 mg/hr (01/19/15 0052)   [id number] carbonate, hydrALAZINE, hydrOXYzine, morphine injection  Assessment/Plan:  Principal Problem:   GIB (gastrointestinal bleeding) Active Problems:   Essential hypertension   Anemia   HLD (hyperlipidemia)    GI bleed/large duodenal ulcer Most  likely bled from his duodenal ulcer. Status post EGD 12/15. Patient is on intravenous Protonix infusion. Gastroenterology is following. Defer to them to advanc diet. No further recurrence of bleeding. H. pylori serology is pending.  Acute blood loss anemia Patient has been transfused 4 units of blood. Hemoglobin has responded appropriately and is stable. Continue to monitor closely.  Abnormal LFTs Mildly elevated transaminases. Remains stable. Etiology is unclear. Hepatitis panel is negative. Obtain ultrasound. Will also need outpatient follow-up.  History of essential hypertension Blood pressure is reasonably well controlled here. He hasn't taken any antihypertensives in 6 months.   DVT Prophylaxis: SCDs    Code Status: Full code  Family Communication: Discussed with the patient and his wife Disposition Plan: Continue current management. Discharge when cleared by gastroenterology. Most likely early next week.    LOS: 2 days   [place] [facility] Pager [phone] 01/19/2015, 7:42 AM  If 7PM-7AM, please contact night-coverage at [email or web address], password [facility]

## 2015-01-20 ENCOUNTER — Inpatient Hospital Stay (HOSPITAL_COMMUNITY): Payer: BLUE CROSS/BLUE SHIELD

## 2015-01-20 LAB — CBC
HCT: 25.3 % — ABNORMAL LOW (ref 39.0–52.0)
HCT: 26.4 % — ABNORMAL LOW (ref 39.0–52.0)
Hemoglobin: 8.3 g/dL — ABNORMAL LOW (ref 13.0–17.0)
Hemoglobin: 8.8 g/dL — ABNORMAL LOW (ref 13.0–17.0)
MCH: 27.8 pg (ref 26.0–34.0)
MCH: 28.1 pg (ref 26.0–34.0)
MCHC: 32.8 g/dL (ref 30.0–36.0)
MCHC: 33.3 g/dL (ref 30.0–36.0)
MCV: 84.6 fL (ref 78.0–100.0)
MCV: 84.3 fL (ref 78.0–100.0)
PLATELETS: 298 10*3/uL (ref 150–400)
PLATELETS: 322 10*3/uL (ref 150–400)
RBC: 2.99 MIL/uL — ABNORMAL LOW (ref 4.22–5.81)
RBC: 3.13 MIL/uL — ABNORMAL LOW (ref 4.22–5.81)
RDW: 15.4 % (ref 11.5–15.5)
RDW: 15.6 % — AB (ref 11.5–15.5)
WBC: 6.9 10*3/uL (ref 4.0–10.5)
WBC: 7.5 10*3/uL (ref 4.0–10.5)

## 2015-01-20 LAB — GLUCOSE, CAPILLARY: GLUCOSE-CAPILLARY: 81 mg/dL (ref 65–99)

## 2015-01-20 LAB — H. PYLORI ANTIBODY, IGG: H PYLORI IGG: 5.4 U/mL — AB (ref 0.0–0.8)

## 2015-01-20 LAB — LACTATE DEHYDROGENASE: LDH: 213 U/L — AB (ref 98–192)

## 2015-01-20 IMAGING — CT CT ABD-PELV W/ CM
2 of 5 series · 15 of 46 positions shown, 17 images · IV contrast (Omni 300)
Comparison: Right upper quadrant abdominal ultrasound on
[DATE].

CLINICAL DATA: GI bleed with syncopal episodes. Massive duodenal
bulb ulcer by EGD. Abnormal abdominal ultrasound demonstrating
heterogeneous soft tissue prominence in the region of the porta
hepatis pancreatic head.

EXAM:
CT ABDOMEN AND PELVIS WITH CONTRAST
TECHNIQUE: Multidetector CT imaging of the abdomen and pelvis was performed
using the standard protocol following bolus administration of
intravenous contrast.
CONTRAST:  100mL OMNIPAQUE IOHEXOL 300 MG/ML  SOLN

[Series 2: a/p w/ 5mm · axial · 0.80mm/px · z∈[+676,+1106]mm · 12 of 98 slices shown, 14 images]
[im 6/98  soft-tissue]
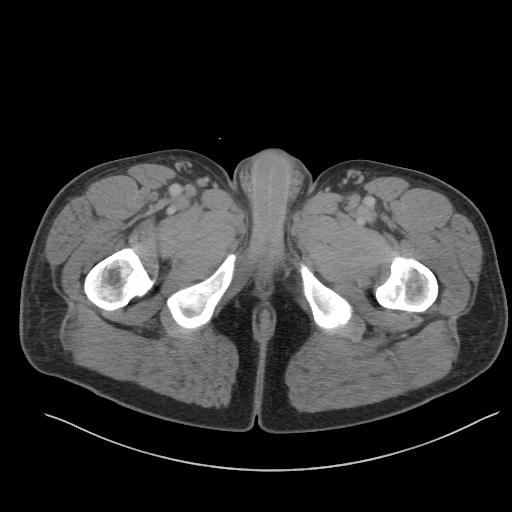
[im 6/98  bone]
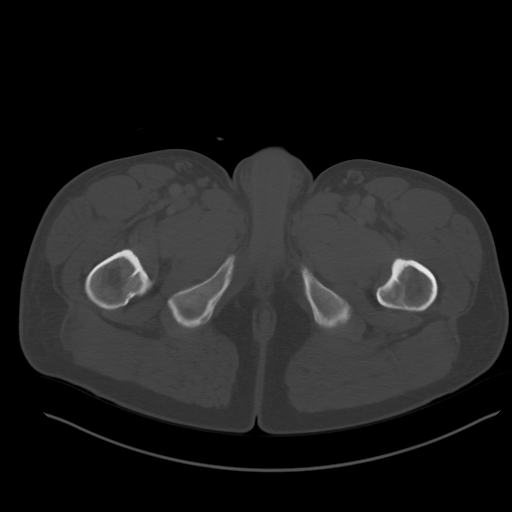
[im 16/98  soft-tissue]
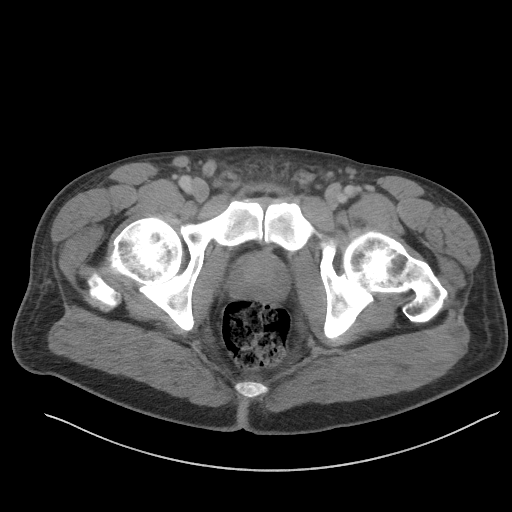
[im 21/98  soft-tissue]
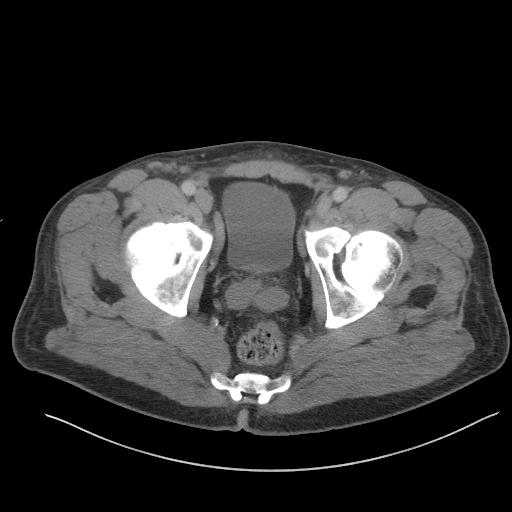
[im 31/98  soft-tissue]
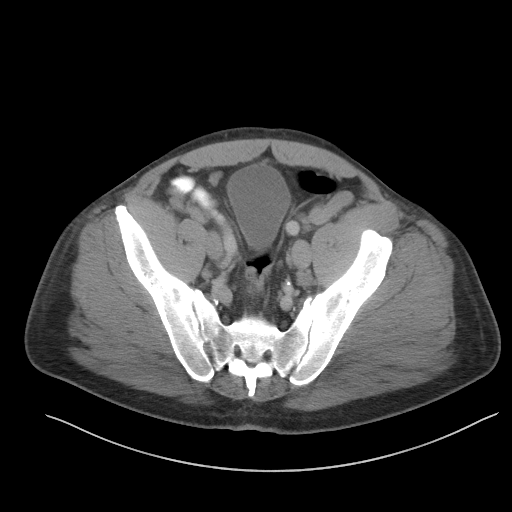
[im 36/98  soft-tissue]
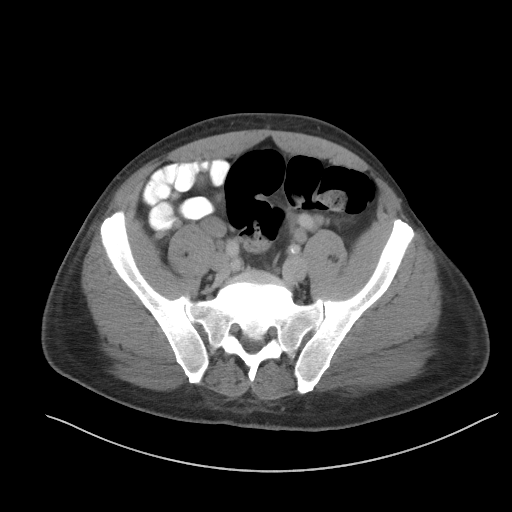
[im 46/98  soft-tissue]
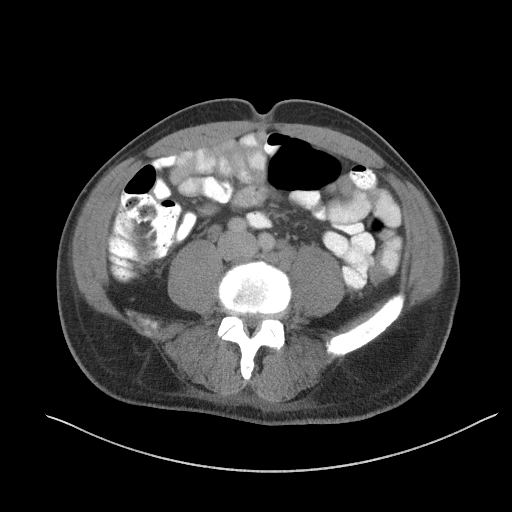
[im 52/98  soft-tissue]
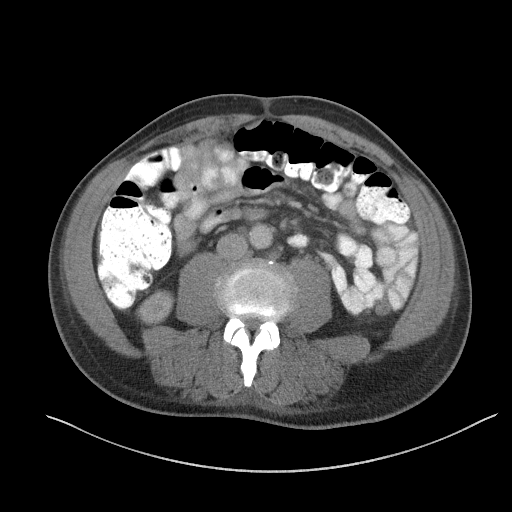
[im 62/98  soft-tissue]
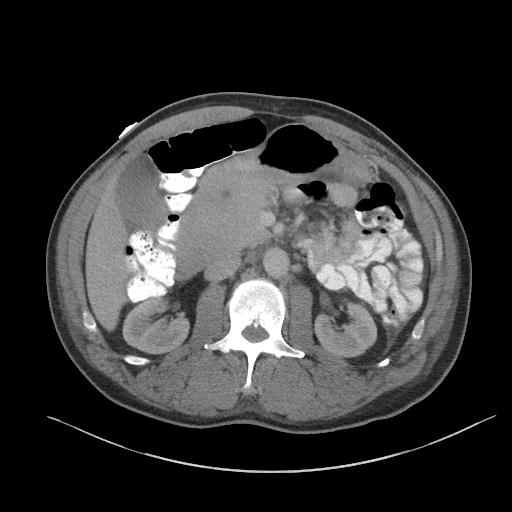
[im 67/98  soft-tissue]
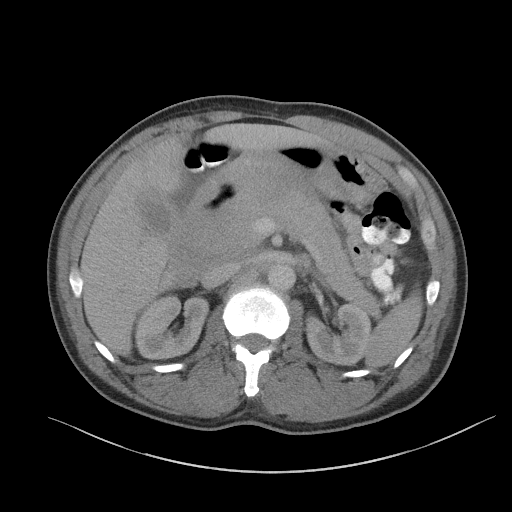
[im 67/98  bone]
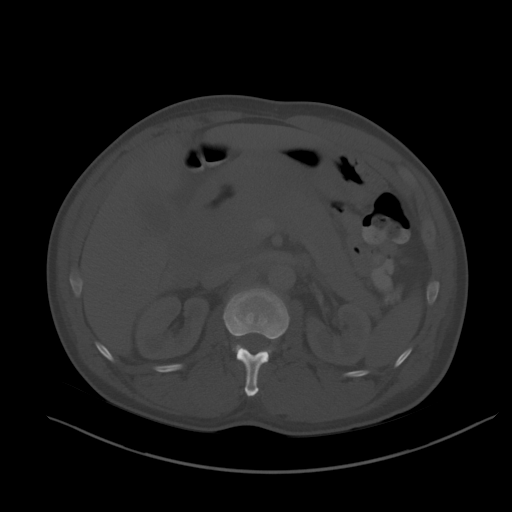
[im 77/98  soft-tissue]
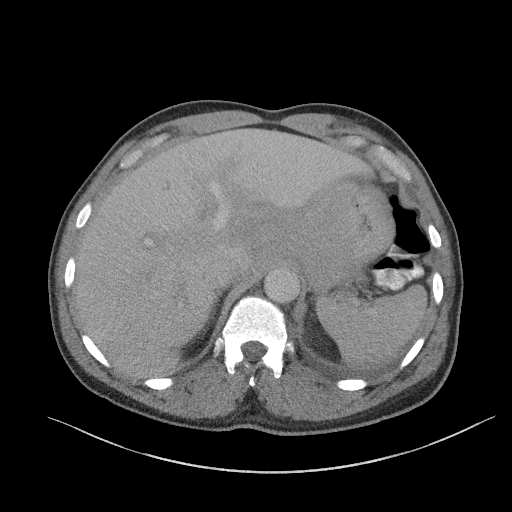
[im 82/98  soft-tissue]
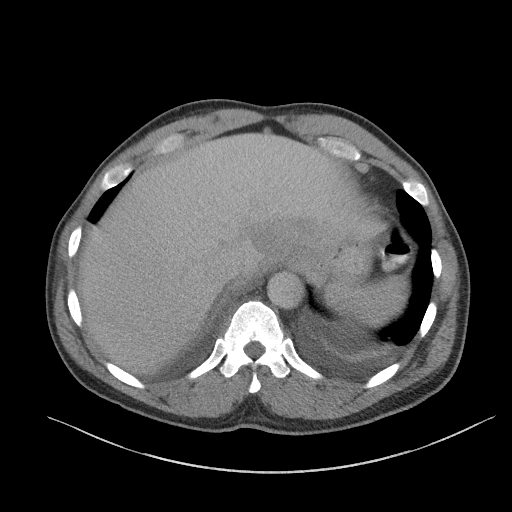
[im 92/98  soft-tissue]
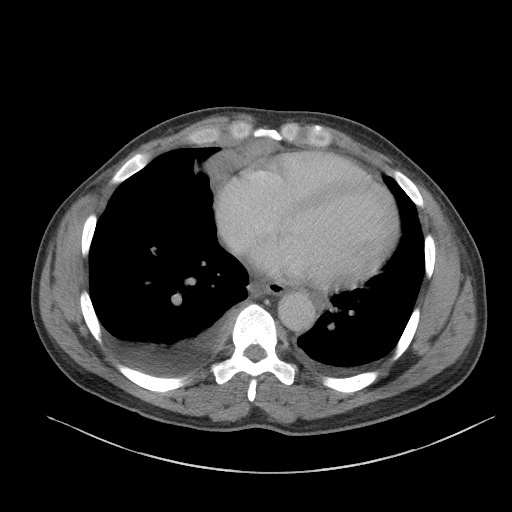

[Series 5: a/p w/ cor · coronal · 0.79mm/px · 3 of 147 slices shown]
[im 49/147  soft-tissue]
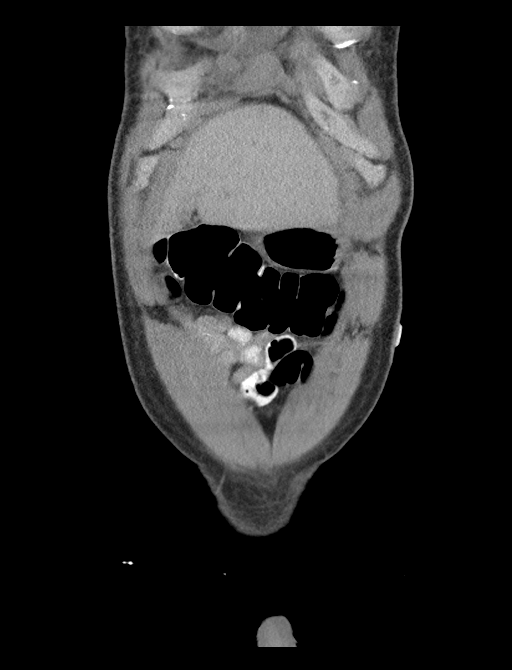
[im 65/147  soft-tissue]
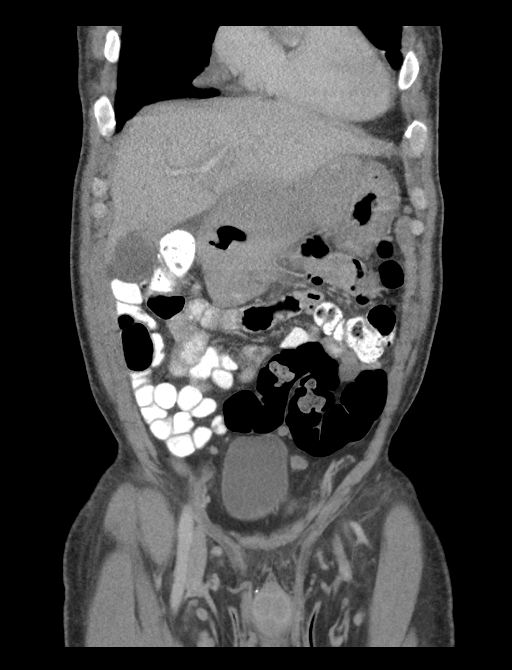
[im 82/147  soft-tissue]
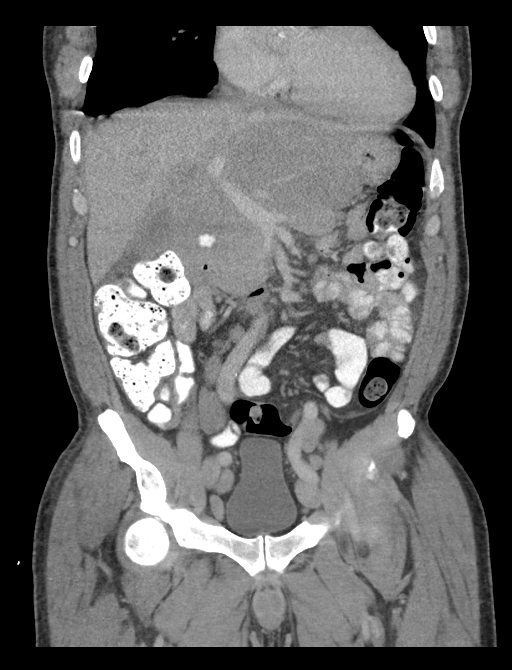

[15 of 46 positions shown; findings below may reference images not displayed]

FINDINGS: In the lower chest, soft tissue mass in the anterior lower
mediastinum abuts the pericardium and measures roughly 4.0 x 7.7 cm
in axial dimensions. This is most likely consistent with a lymph
node mass. The superior images also show potentially the bottom
margin of subcarinal lymphadenopathy. There are small bilateral
pleural effusions, right greater than left.

In abdomen, a large confluent mass is identified involving the
distal stomach and proximal duodenum with visible ulceration. At the
level of the proximal duodenum, thickening of the duodenal wall
approaches 3.5 cm. Soft tissue mass extends into the porta hepatis
and gastrohepatic region and likely tracks along portal triads into
the liver. Soft tissue mass also abuts and obscures definition of
the pancreatic head and proximal pancreatic body. Mass also
surrounds the portal vein, portal confluence and abuts the superior
mesenteric vein.

There is associated retroperitoneal lymphadenopathy in the lower
abdomen and pelvis. Extensive bilateral iliac chain lymphadenopathy
present. Elongated external iliac lymph node masses bilaterally
measure roughly 1.8 cm in short axis. Multiple mildly enlarged
bilateral inguinal lymph nodes also present.

Conglomeration of findings is most likely consistent with lymphoma
with predominately gastric and duodenal involvement.

No evidence of bowel obstruction or free intraperitoneal air. No
focal abscess is identified. The gallbladder, distal pancreas,
spleen, adrenal glands and kidneys appear unremarkable. No bony
lesions are seen. The bladder is decompressed. No hernias are
identified.
IMPRESSION: Large ulcerated mass involving the distal stomach and proximal
duodenum and extending into the porta hepatis and gastrohepatic
region. Associated lymphadenopathy in the lower anterior
mediastinum, retroperitoneum and pelvis. Also suspected subcarinal
lymphadenopathy is partially visualized. Conglomeration of findings
are most likely consistent with lymphoma.

## 2015-01-20 MED ORDER — BARIUM SULFATE 2.1 % PO SUSP
450.0000 mL | ORAL | Status: AC
  Administered 2015-01-20: 450 mL via ORAL

## 2015-01-20 MED ORDER — BARIUM SULFATE 2.1 % PO SUSP
ORAL | Status: AC
  Administered 2015-01-20: 1 mL
  Filled 2015-01-20: qty 2

## 2015-01-20 MED ORDER — LORAZEPAM 2 MG/ML IJ SOLN
0.5000 mg | Freq: Once | INTRAMUSCULAR | Status: DC
  Filled 2015-01-20: qty 1

## 2015-01-20 MED ORDER — IOHEXOL 300 MG/ML  SOLN
100.0000 mL | Freq: Once | INTRAMUSCULAR | Status: AC | PRN
  Administered 2015-01-20: 100 mL via INTRAVENOUS

## 2015-01-20 MED ORDER — CEFAZOLIN SODIUM-DEXTROSE 2-3 GM-% IV SOLR
2.0000 g | Freq: Once | INTRAVENOUS | Status: AC
  Administered 2015-01-22: 2 g via INTRAVENOUS
  Filled 2015-01-20 (×2): qty 50

## 2015-01-20 NOTE — Consult Note (Signed)
Reason for Consult:lymphadenopathy Referring Physician: Dr. Bonnielee Haff  [name] is an 64 y.o. male.  HPI: I have been asked to evaluate this patient by Dr. [place] Pink for lymphadenopathy. He initially presented several days ago with weakness, dizziness, and melena. He had a hemoglobin of 4.5. He underwent endoscopy showing a large duodenal ulcer. It was no longer bleeding. He has since improved with transfusions. Today, he underwent a CAT scan of the abdomen and pelvis showing a large mass encompassing the distal stomach and duodenum as well as porta hepatis with retroperitoneal adenopathy, inguinal adenopathy, and mediastinal adenopathy. I was asked to see him for possible lymph node biopsy. He is currently otherwise without complaints. He has no cardiopulmonary issues.    Past Medical History  Diagnosis Date  . Essential hypertension   . HLD (hyperlipidemia)     Past Surgical History  Procedure Laterality Date  . Skin surgery      Small benign cysts over left scalp removed  . Esophagogastroduodenoscopy Left 01/18/2015    Procedure: ESOPHAGOGASTRODUODENOSCOPY (EGD);  Surgeon: Wilford Corner, MD;  Location: [facility] ENDOSCOPY;  Service: Endoscopy;  Laterality: Left;    Family History  Problem Relation Age of Onset  . Hypertension Mother   . Hypertension Father   . Hypertension Sister   . Diabetes Sister   . Prostate cancer Brother   . Lupus Sister   . Kidney failure Father     Social History:  reports that he has never smoked. He does not have any smokeless tobacco history on file. He reports that he does not drink alcohol or use illicit drugs.  Allergies: No Known Allergies  Medications: I have reviewed the patient's current medications.  Results for orders placed or performed during the hospital encounter of 01/17/15 (from the past 48 hour(s))  CBC     Status: Abnormal   Collection Time: 01/18/15  4:45 PM  Result Value Ref Range   WBC 9.5 4.0 - 10.5 K/uL   RBC 3.02  (L) 4.22 - 5.81 MIL/uL   Hemoglobin 8.2 (L) 13.0 - 17.0 g/dL   HCT 25.3 (L) 39.0 - 52.0 %   MCV 83.8 78.0 - 100.0 fL   MCH 27.2 26.0 - 34.0 pg   MCHC 32.4 30.0 - 36.0 g/dL   RDW 15.0 11.5 - 15.5 %   Platelets 280 150 - 400 K/uL  Hepatitis panel, acute     Status: None   Collection Time: 01/18/15  4:45 PM  Result Value Ref Range   Hepatitis B Surface Ag Negative Negative   HCV Ab <0.1 0.0 - 0.9 s/co ratio    Comment: (NOTE)                                  Negative:     < 0.8                             Indeterminate: 0.8 - 0.9                                  Positive:     > 0.9 The CDC recommends that a positive HCV antibody result be followed up with a HCV Nucleic Acid Amplification test ([id number]). Performed At: [facility] [place], [place] [id number] Lindon Romp MD YK:[id number]  Hep A IgM Negative Negative   Hep B C IgM Negative Negative  CBC     Status: Abnormal   Collection Time: 01/18/15  8:43 PM  Result Value Ref Range   WBC 7.9 4.0 - 10.5 K/uL   RBC 2.79 (L) 4.22 - 5.81 MIL/uL   Hemoglobin 7.8 (L) 13.0 - 17.0 g/dL   HCT 23.2 (L) 39.0 - 52.0 %   MCV 83.2 78.0 - 100.0 fL   MCH 28.0 26.0 - 34.0 pg   MCHC 33.6 30.0 - 36.0 g/dL   RDW 15.0 11.5 - 15.5 %   Platelets 246 150 - 400 K/uL  CBC     Status: Abnormal   Collection Time: 01/19/15  2:35 AM  Result Value Ref Range   WBC 7.5 4.0 - 10.5 K/uL   RBC 2.86 (L) 4.22 - 5.81 MIL/uL   Hemoglobin 8.0 (L) 13.0 - 17.0 g/dL   HCT 23.8 (L) 39.0 - 52.0 %   MCV 83.2 78.0 - 100.0 fL   MCH 28.0 26.0 - 34.0 pg   MCHC 33.6 30.0 - 36.0 g/dL   RDW 15.1 11.5 - 15.5 %   Platelets 237 150 - 400 K/uL  Comprehensive metabolic panel     Status: Abnormal   Collection Time: 01/19/15  2:35 AM  Result Value Ref Range   Sodium 138 135 - 145 mmol/L   Potassium 3.8 3.5 - 5.1 mmol/L   Chloride 110 101 - 111 mmol/L   CO2 23 22 - 32 mmol/L   Glucose, Bld 84 65 - 99 mg/dL   BUN 18 6 - 20 mg/dL   Creatinine,  Ser 0.97 0.61 - 1.24 mg/dL   Calcium 7.6 (L) 8.9 - 10.3 mg/dL   Total Protein 5.1 (L) 6.5 - 8.1 g/dL   Albumin 2.4 (L) 3.5 - 5.0 g/dL   AST 35 15 - 41 U/L   ALT 73 (H) 17 - 63 U/L   Alkaline Phosphatase 62 38 - 126 U/L   Total Bilirubin 0.8 0.3 - 1.2 mg/dL   GFR calc non Af Amer >60 >60 mL/min   GFR calc Af Amer >60 >60 mL/min    Comment: (NOTE) The eGFR has been calculated using the CKD EPI equation. This calculation has not been validated in all clinical situations. eGFR's persistently <60 mL/min signify possible Chronic Kidney Disease.    Anion gap 5 5 - 15  Glucose, capillary     Status: None   Collection Time: 01/19/15  8:06 AM  Result Value Ref Range   Glucose-Capillary 71 65 - 99 mg/dL  H. pylori antibody, IgG     Status: Abnormal   Collection Time: 01/19/15 10:06 AM  Result Value Ref Range   H Pylori IgG 5.4 (H) 0.0 - 0.8 U/mL    Comment: (NOTE)                             Negative            <0.9                             Indeterminate  0.9 - 1.0                             Positive            >1.0 Performed At: [facility]  [address] [place], [place] [id number] Lindon Romp MD FA:[id number]   CBC     Status: Abnormal   Collection Time: 01/19/15  4:10 PM  Result Value Ref Range   WBC 8.8 4.0 - 10.5 K/uL   RBC 3.26 (L) 4.22 - 5.81 MIL/uL   Hemoglobin 8.7 (L) 13.0 - 17.0 g/dL   HCT 27.4 (L) 39.0 - 52.0 %   MCV 84.0 78.0 - 100.0 fL   MCH 26.7 26.0 - 34.0 pg   MCHC 31.8 30.0 - 36.0 g/dL   RDW 15.1 11.5 - 15.5 %   Platelets 369 150 - 400 K/uL  CBC     Status: Abnormal   Collection Time: 01/20/15  4:00 AM  Result Value Ref Range   WBC 6.9 4.0 - 10.5 K/uL   RBC 2.99 (L) 4.22 - 5.81 MIL/uL   Hemoglobin 8.3 (L) 13.0 - 17.0 g/dL   HCT 25.3 (L) 39.0 - 52.0 %   MCV 84.6 78.0 - 100.0 fL   MCH 27.8 26.0 - 34.0 pg   MCHC 32.8 30.0 - 36.0 g/dL   RDW 15.4 11.5 - 15.5 %   Platelets 298 150 - 400 K/uL  Glucose, capillary     Status: None   Collection  Time: 01/20/15  7:29 AM  Result Value Ref Range   Glucose-Capillary 81 65 - 99 mg/dL    Ct Abdomen Pelvis W Contrast  01/20/2015  CLINICAL DATA:  GI bleed with syncopal episodes. Massive duodenal bulb ulcer by EGD. Abnormal abdominal ultrasound demonstrating heterogeneous soft tissue prominence in the region of the porta hepatis pancreatic head. EXAM: CT ABDOMEN AND PELVIS WITH CONTRAST TECHNIQUE: Multidetector CT imaging of the abdomen and pelvis was performed using the standard protocol following bolus administration of intravenous contrast. CONTRAST:  121m OMNIPAQUE IOHEXOL 300 MG/ML  SOLN COMPARISON:  Right upper quadrant abdominal ultrasound on 01/19/2015. FINDINGS: In the lower chest, soft tissue mass in the anterior lower mediastinum abuts the pericardium and measures roughly 4.0 x 7.7 cm in axial dimensions. This is most likely consistent with a lymph node mass. The superior images also show potentially the bottom margin of subcarinal lymphadenopathy. There are small bilateral pleural effusions, right greater than left. In abdomen, a large confluent mass is identified involving the distal stomach and proximal duodenum with visible ulceration. At the level of the proximal duodenum, thickening of the duodenal wall approaches 3.5 cm. Soft tissue mass extends into the porta hepatis and gastrohepatic region and likely tracks along portal triads into the liver. Soft tissue mass also abuts and obscures definition of the pancreatic head and proximal pancreatic body. Mass also surrounds the portal vein, portal confluence and abuts the superior mesenteric vein. There is associated retroperitoneal lymphadenopathy in the lower abdomen and pelvis. Extensive bilateral iliac chain lymphadenopathy present. Elongated external iliac lymph node masses bilaterally measure roughly 1.8 cm in short axis. Multiple mildly enlarged bilateral inguinal lymph nodes also present. Conglomeration of findings is most likely  consistent with lymphoma with predominately gastric and duodenal involvement. No evidence of bowel obstruction or free intraperitoneal air. No focal abscess is identified. The gallbladder, distal pancreas, spleen, adrenal glands and kidneys appear unremarkable. No bony lesions are seen. The bladder is decompressed. No hernias are identified. IMPRESSION: Large ulcerated mass involving the distal stomach and proximal duodenum and extending into the porta hepatis and gastrohepatic region. Associated lymphadenopathy in the lower anterior mediastinum, retroperitoneum and pelvis. Also suspected subcarinal lymphadenopathy is partially visualized. Conglomeration of findings are  most likely consistent with lymphoma. Electronically Signed   By: Aletta Edouard M.D.   On: 01/20/2015 13:50   US Abdomen Limited Ruq  01/19/2015  CLINICAL DATA:  Transaminitis. History of duodenal ulcer with adherent clot. EXAM: US ABDOMEN LIMITED - RIGHT UPPER QUADRANT COMPARISON:  None. FINDINGS: Gallbladder: No gallstones or wall thickening visualized. Minimal dependent sludge. No sonographic Murphy sign noted. Small amounts of pericholecystic free fluid. Common bile duct: Diameter: 4.2 mm. Liver: Ill-defined heterogeneous echogenicity in the region of the pancreatic head and porta hepatis, borders not well-defined. Mildly heterogeneous hepatic parenchyma. No intrahepatic biliary ductal dilatation. Normal directional flow in the main portal vein. Right pleural effusion, incidentally noted. IMPRESSION: 1. Heterogeneous echogenicity in the region of the porta hepatis and pancreatic head, difficult to separate from the adjacent liver parenchyma. This may reflect hematoma/blood products in the setting of bleeding duodenal ulcer versus pancreatic head or focal liver lesion. There is no biliary ductal dilatation. Recommend further characterization with cross-sectional imaging, contrast-enhanced CT or MRI. 2. Minimal sludge in the gallbladder, no  wall thickening or gallstones. Small amount of pericholecystic fluid. No sonographic Murphy sign or findings of acute cholecystitis. Electronically Signed   By: Jeb Levering M.D.   On: 01/19/2015 20:48    Review of Systems  All other systems reviewed and are negative.  Blood pressure 131/85, pulse 64, temperature 98 F (36.7 C), temperature source Oral, resp. rate 17, height _0  (1.778 m), weight 92.534 kg (204 lb), SpO2 100 %. Physical Exam  Constitutional: He is oriented to person, place, and time. He appears well-developed and well-nourished. No distress.  HENT:  Head: Normocephalic and atraumatic.  Right Ear: External ear normal.  Left Ear: External ear normal.  Nose: Nose normal.  Mouth/Throat: Oropharynx is clear and moist.  Eyes: Conjunctivae are normal. Pupils are equal, round, and reactive to light. Right eye exhibits no discharge. Left eye exhibits no discharge. No scleral icterus.  Neck: Normal range of motion. Neck supple. No tracheal deviation present.  Cardiovascular: Normal rate, regular rhythm, normal heart sounds and intact distal pulses.   No murmur heard. Respiratory: Effort normal and breath sounds normal. No respiratory distress. He has no wheezes.  GI: Soft. There is no tenderness. There is no rebound and no guarding.  Musculoskeletal: Normal range of motion. He exhibits no edema or tenderness.  Lymphadenopathy:    He has no cervical adenopathy.       Right: Inguinal adenopathy present.       Left: Inguinal adenopathy present.  Neurological: He is alert and oriented to person, place, and time.  Skin: Skin is warm and dry. He is not diaphoretic. No erythema.  Psychiatric: His behavior is normal. Judgment normal.    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 I discussed this with him in detail. We would plan on proceeding to the operating room  on Monday with my partner Dr. Georgette Dover for excisional biopsies of right angle lymph nodes. I briefly discussed the procedure with him including the risks. Given his recent bleeding from the ulcer, I would recommend he stay in the hospital until the biopsy can be performed. He understands and agrees with the plan.  Alleigh Mollica A 01/20/2015, 3:23 PM

## 2015-01-20 NOTE — Progress Notes (Signed)
Attempted report to [address].

## 2015-01-20 NOTE — Progress Notes (Signed)
Pt transferred to [facility] room 15 per W/C with all belongings by Nurse Tech. Terri RN aware that pt had his 2nd  bottle of Barium prior to CT scan Pt has already had 2 bottles in. Wife aware of transfer

## 2015-01-20 NOTE — Progress Notes (Addendum)
[facility] PROGRESS NOTE  [name] [id number] DOB: 1950-12-11 DOA: 01/17/2015  PCP: No primary care provider on file.  Brief HPI: 64 year old African-American male with a past medical history of hypertension, hyperlipidemia, medication noncompliance, presented with black stools, dizziness and syncopal episodes. Patient was found to be profoundly anemic. He had heme positive stools. He was hospitalized for further management. He underwent EGD which revealed duodenal ulcer.  Past medical history:  Past Medical History  Diagnosis Date  . Essential hypertension   . HLD (hyperlipidemia)     Consultants: [facility]  Procedures:  EGD ENDOSCOPIC IMPRESSION: Massive Duodenal Bulb Ulcer with adherent clot - high risk for rebleeding. Ulcerated pyloric channel  Antibiotics: None  Subjective: Patient continues to feel well. Denies any abdominal pain, nausea, vomiting. No bowel movements.    Objective: Vital Signs  Filed Vitals:   01/19/15 2027 01/19/15 2357 01/20/15 0431 01/20/15 0700  BP: 143/82 128/87 127/79 131/85  Pulse: 77 73 76 64  Temp: 97.9 F (36.6 C) 98 F (36.7 C) 97.8 F (36.6 C) 98 F (36.7 C)  TempSrc: Oral Oral Oral Oral  Resp: 17 16 14 17   Height:      Weight:      SpO2: 100% 99% 99% 100%    Intake/Output Summary (Last 24 hours) at 01/20/15 0742 Last data filed at 01/20/15 [id number]  Gross per 24 hour  Intake   3205 ml  Output   1200 ml  Net   2005 ml   Filed Weights   01/17/15 1614 01/17/15 2130 01/18/15 1204  Weight: 92.987 kg (205 lb) 92.9 kg (204 lb 12.9 oz) 92.534 kg (204 lb)    General appearance: alert, cooperative, appears stated age and no distress Resp: clear to auscultation bilaterally Cardio: regular rate and rhythm, S1, S2 normal, no murmur, click, rub or gallop GI: soft, non-tender; bowel sounds normal; no masses,  no organomegaly Neurologic: Alert and oriented 3. No focal neurological deficits.  Lab  Results:  Basic Metabolic Panel:  Recent Labs Lab 01/17/15 1740 01/18/15 0941 01/19/15 0235  NA 139 141 138  K 4.1 4.2 3.8  CL 102 111 110  CO2 28 24 23   GLUCOSE 122* 94 84  BUN 43* 28* 18  CREATININE 1.22 0.93 0.97  CALCIUM 7.9* 7.9* 7.6*   Liver Function Tests:  Recent Labs Lab 01/17/15 1740 01/18/15 0941 01/19/15 0235  AST 38 42* 35  ALT 97* 84* 73*  ALKPHOS 82 73 62  BILITOT 0.3 1.4* 0.8  PROT 5.5* 5.2* 5.1*  ALBUMIN 2.7* 2.6* 2.4*   CBC:  Recent Labs Lab 01/17/15 1740  01/18/15 1645 01/18/15 2043 01/19/15 0235 01/19/15 1610 01/20/15 0400  WBC 9.6  < > 9.5 7.9 7.5 8.8 6.9  NEUTROABS 7.6  --   --   --   --   --   --   HGB 5.1*  < > 8.2* 7.8* 8.0* 8.7* 8.3*  HCT 16.0*  < > 25.3* 23.2* 23.8* 27.4* 25.3*  MCV 80.4  < > 83.8 83.2 83.2 84.0 84.6  PLT 343  < > 280 246 237 369 298  < > = values in this interval not displayed. CBG:  Recent Labs Lab 01/18/15 0759 01/19/15 0806  GLUCAP 91 71    Recent Results (from the past 240 hour(s))  MRSA PCR Screening     Status: None   Collection Time: 01/17/15  9:07 PM  Result Value Ref Range Status   MRSA by PCR NEGATIVE NEGATIVE  Final    Comment:        The GeneXpert MRSA Assay (FDA approved for NASAL specimens only), is one component of a comprehensive MRSA colonization surveillance program. It is not intended to diagnose MRSA infection nor to guide or monitor treatment for MRSA infections.       Studies/Results: US Abdomen Limited Ruq  01/19/2015  CLINICAL DATA:  Transaminitis. History of duodenal ulcer with adherent clot. EXAM: US ABDOMEN LIMITED - RIGHT UPPER QUADRANT COMPARISON:  None. FINDINGS: Gallbladder: No gallstones or wall thickening visualized. Minimal dependent sludge. No sonographic Murphy sign noted. Small amounts of pericholecystic free fluid. Common bile duct: Diameter: 4.2 mm. Liver: Ill-defined heterogeneous echogenicity in the region of the pancreatic head and porta hepatis,  borders not well-defined. Mildly heterogeneous hepatic parenchyma. No intrahepatic biliary ductal dilatation. Normal directional flow in the main portal vein. Right pleural effusion, incidentally noted. IMPRESSION: 1. Heterogeneous echogenicity in the region of the porta hepatis and pancreatic head, difficult to separate from the adjacent liver parenchyma. This may reflect hematoma/blood products in the setting of bleeding duodenal ulcer versus pancreatic head or focal liver lesion. There is no biliary ductal dilatation. Recommend further characterization with cross-sectional imaging, contrast-enhanced CT or MRI. 2. Minimal sludge in the gallbladder, no wall thickening or gallstones. Small amount of pericholecystic fluid. No sonographic Murphy sign or findings of acute cholecystitis. Electronically Signed   By: Jeb Levering M.D.   On: 01/19/2015 20:48    Medications:  Scheduled: . LORazepam  0.5 mg Intravenous Once  . pantoprazole (PROTONIX) IV  80 mg Intravenous Once  . [START ON 01/22/2015] pantoprazole (PROTONIX) IV  40 mg Intravenous Q12H  . sodium chloride  3 mL Intravenous Q12H   Continuous: . sodium chloride 100 mL/hr at 01/19/15 1637  . pantoprozole (PROTONIX) infusion 8 mg/hr (01/20/15 0021)   [id number] carbonate, hydrALAZINE, hydrOXYzine, morphine injection  Assessment/Plan:  Principal Problem:   GIB (gastrointestinal bleeding) Active Problems:   Essential hypertension   Anemia   HLD (hyperlipidemia)    GI bleed/large duodenal ulcer Most likely bled from his duodenal ulcer. Status post EGD 12/15. Patient is on intravenous Protonix infusion. Gastroenterology is following. Discussed with Dr. Penelope Coop today. Advance to full liquids. Continue PPI infusion at least through tomorrow. Hasn't had any further recurrence of bleeding. H. pylori serology is pending.  Acute blood loss anemia Patient was transfused 4 units of blood. Hemoglobin has responded appropriately and is stable.  Continue to monitor closely.  Abnormal LFTs Mildly elevated transaminases. Remains stable. Etiology is unclear. Hepatitis panel is negative. Ultrasound was inconclusive. CT of the abdomen and pelvis has been ordered. Discussed with the patient.  History of essential hypertension Blood pressure is reasonably well controlled here. He hasn't taken any antihypertensives in 6 months.  ADDENDUM CT abdomen revealed large mass involving distal; stomach and duodenum. Other abnormalities also noted. These changes are suggestive of lymphoma per radiology report. Discussed with pateint. Will nee tissue sample for diagnosis. Discussed with Dr. Jana Hakim. He recommends excision of a lymph node if available. Discussed with Dr. Ninfa Linden with general surgery who will evaluate patient.  DVT Prophylaxis: SCDs    Code Status: Full code  Family Communication: Discussed with the patient and his wife Disposition Plan: Continue current management. Okay for transfer to floor. He remains stable. Anticipate discharge by Monday.    LOS: 3 days   [facility] Pager [phone] 01/20/2015, 7:42 AM  If 7PM-7AM, please contact night-coverage at [email or web address], password [facility]

## 2015-01-20 NOTE — Progress Notes (Signed)
[facility] Progress Note  Subjective: Patient feels well today. He has no complaints today. No further signs of gastrointestinal bleeding.  Objective: Vital signs in last 24 hours: Temp:  [97.8 F (36.6 C)-98 F (36.7 C)] 98 F (36.7 C) (12/17 0700) Pulse Rate:  [64-89] 64 (12/17 0700) Resp:  [14-23] 17 (12/17 0700) BP: (127-170)/(79-100) 131/85 mmHg (12/17 0700) SpO2:  [98 %-100 %] 100 % (12/17 0700) Weight change:    PE:  No distress  Heart regular rhythm  Lungs clear  Abdomen: Bowel sounds present, soft, nontender  Lab Results: Results for orders placed or performed during the hospital encounter of 01/17/15 (from the past 24 hour(s))  CBC     Status: Abnormal   Collection Time: 01/19/15  4:10 PM  Result Value Ref Range   WBC 8.8 4.0 - 10.5 K/uL   RBC 3.26 (L) 4.22 - 5.81 MIL/uL   Hemoglobin 8.7 (L) 13.0 - 17.0 g/dL   HCT 27.4 (L) 39.0 - 52.0 %   MCV 84.0 78.0 - 100.0 fL   MCH 26.7 26.0 - 34.0 pg   MCHC 31.8 30.0 - 36.0 g/dL   RDW 15.1 11.5 - 15.5 %   Platelets 369 150 - 400 K/uL  CBC     Status: Abnormal   Collection Time: 01/20/15  4:00 AM  Result Value Ref Range   WBC 6.9 4.0 - 10.5 K/uL   RBC 2.99 (L) 4.22 - 5.81 MIL/uL   Hemoglobin 8.3 (L) 13.0 - 17.0 g/dL   HCT 25.3 (L) 39.0 - 52.0 %   MCV 84.6 78.0 - 100.0 fL   MCH 27.8 26.0 - 34.0 pg   MCHC 32.8 30.0 - 36.0 g/dL   RDW 15.4 11.5 - 15.5 %   Platelets 298 150 - 400 K/uL  Glucose, capillary     Status: None   Collection Time: 01/20/15  7:29 AM  Result Value Ref Range   Glucose-Capillary 81 65 - 99 mg/dL    Studies/Results: US Abdomen Limited Ruq  01/19/2015  CLINICAL DATA:  Transaminitis. History of duodenal ulcer with adherent clot. EXAM: US ABDOMEN LIMITED - RIGHT UPPER QUADRANT COMPARISON:  None. FINDINGS: Gallbladder: No gallstones or wall thickening visualized. Minimal dependent sludge. No sonographic Murphy sign noted. Small amounts of pericholecystic free fluid. Common bile duct:  Diameter: 4.2 mm. Liver: Ill-defined heterogeneous echogenicity in the region of the pancreatic head and porta hepatis, borders not well-defined. Mildly heterogeneous hepatic parenchyma. No intrahepatic biliary ductal dilatation. Normal directional flow in the main portal vein. Right pleural effusion, incidentally noted. IMPRESSION: 1. Heterogeneous echogenicity in the region of the porta hepatis and pancreatic head, difficult to separate from the adjacent liver parenchyma. This may reflect hematoma/blood products in the setting of bleeding duodenal ulcer versus pancreatic head or focal liver lesion. There is no biliary ductal dilatation. Recommend further characterization with cross-sectional imaging, contrast-enhanced CT or MRI. 2. Minimal sludge in the gallbladder, no wall thickening or gallstones. Small amount of pericholecystic fluid. No sonographic Murphy sign or findings of acute cholecystitis. Electronically Signed   By: Jeb Levering M.D.   On: 01/19/2015 20:48      Assessment: GI bleed secondary to a large duodenal ulcer. Currently stable.  Plan:   Continue current management. Watch for further signs of bleeding.    Cassell Clement 01/20/2015, 10:22 AM  Pager: [phone] If no answer or after 5 PM call [phone] Lab Results  Component Value Date   HGB 8.3* 01/20/2015   HGB 8.7* 01/19/2015  HGB 8.0* 01/19/2015   HCT 25.3* 01/20/2015   HCT 27.4* 01/19/2015   HCT 23.8* 01/19/2015   ALKPHOS 62 01/19/2015   ALKPHOS 73 01/18/2015   ALKPHOS 82 01/17/2015   AST 35 01/19/2015   AST 42* 01/18/2015   AST 38 01/17/2015   ALT 73* 01/19/2015   ALT 84* 01/18/2015   ALT 97* 01/17/2015

## 2015-01-20 NOTE — Progress Notes (Signed)
Report called to [facility] on [address]

## 2015-01-21 ENCOUNTER — Other Ambulatory Visit: Payer: Self-pay | Admitting: Oncology

## 2015-01-21 ENCOUNTER — Inpatient Hospital Stay (HOSPITAL_COMMUNITY): Payer: BLUE CROSS/BLUE SHIELD

## 2015-01-21 DIAGNOSIS — R1900 Intra-abdominal and pelvic swelling, mass and lump, unspecified site: Secondary | ICD-10-CM

## 2015-01-21 DIAGNOSIS — D801 Nonfamilial hypogammaglobulinemia: Secondary | ICD-10-CM

## 2015-01-21 DIAGNOSIS — K264 Chronic or unspecified duodenal ulcer with hemorrhage: Principal | ICD-10-CM

## 2015-01-21 DIAGNOSIS — J9859 Other diseases of mediastinum, not elsewhere classified: Secondary | ICD-10-CM | POA: Insufficient documentation

## 2015-01-21 DIAGNOSIS — D508 Other iron deficiency anemias: Secondary | ICD-10-CM

## 2015-01-21 LAB — CBC
HCT: 25 % — ABNORMAL LOW (ref 39.0–52.0)
HEMATOCRIT: 26.9 % — AB (ref 39.0–52.0)
Hemoglobin: 8.3 g/dL — ABNORMAL LOW (ref 13.0–17.0)
Hemoglobin: 8.6 g/dL — ABNORMAL LOW (ref 13.0–17.0)
MCH: 27.9 pg (ref 26.0–34.0)
MCH: 27.2 pg (ref 26.0–34.0)
MCHC: 33.2 g/dL (ref 30.0–36.0)
MCHC: 32 g/dL (ref 30.0–36.0)
MCV: 84.2 fL (ref 78.0–100.0)
MCV: 85.1 fL (ref 78.0–100.0)
PLATELETS: 291 10*3/uL (ref 150–400)
PLATELETS: 360 10*3/uL (ref 150–400)
RBC: 2.97 MIL/uL — AB (ref 4.22–5.81)
RBC: 3.16 MIL/uL — ABNORMAL LOW (ref 4.22–5.81)
RDW: 16.1 % — ABNORMAL HIGH (ref 11.5–15.5)
RDW: 15.8 % — AB (ref 11.5–15.5)
WBC: 6.1 10*3/uL (ref 4.0–10.5)
WBC: 6.1 10*3/uL (ref 4.0–10.5)

## 2015-01-21 LAB — COMPREHENSIVE METABOLIC PANEL
ALBUMIN: 2.5 g/dL — AB (ref 3.5–5.0)
ALT: 50 U/L (ref 17–63)
AST: 23 U/L (ref 15–41)
Alkaline Phosphatase: 64 U/L (ref 38–126)
Anion gap: 6 (ref 5–15)
CHLORIDE: 108 mmol/L (ref 101–111)
CO2: 24 mmol/L (ref 22–32)
CREATININE: 1.03 mg/dL (ref 0.61–1.24)
Calcium: 7.9 mg/dL — ABNORMAL LOW (ref 8.9–10.3)
GFR calc Af Amer: >60 mL/min (ref >60)
GFR calc non Af Amer: >60 mL/min (ref >60)
GLUCOSE: 108 mg/dL — AB (ref 65–99)
POTASSIUM: 3.7 mmol/L (ref 3.5–5.1)
SODIUM: 138 mmol/L (ref 135–145)
Total Bilirubin: 0.5 mg/dL (ref 0.3–1.2)
Total Protein: 5.3 g/dL — ABNORMAL LOW (ref 6.5–8.1)

## 2015-01-21 IMAGING — CT CT CHEST W/ CM
2 of 3 series · 11 of 36 positions shown, 13 images · IV contrast (Iodine)
Comparison: CT abdomen and pelvis dated [DATE].

CLINICAL DATA: Mediastinal mass.

EXAM:
CT CHEST WITH CONTRAST
TECHNIQUE: Multidetector CT imaging of the chest was performed during
intravenous contrast administration.
CONTRAST:  75mL OMNIPAQUE IOHEXOL 300 MG/ML  SOLN

[Series 201: chest with, idose (2) · axial · 0.74mm/px · z∈[-318,-28]mm · 8 of 68 slices shown, 10 images]
[im 5/68  mediastinal]
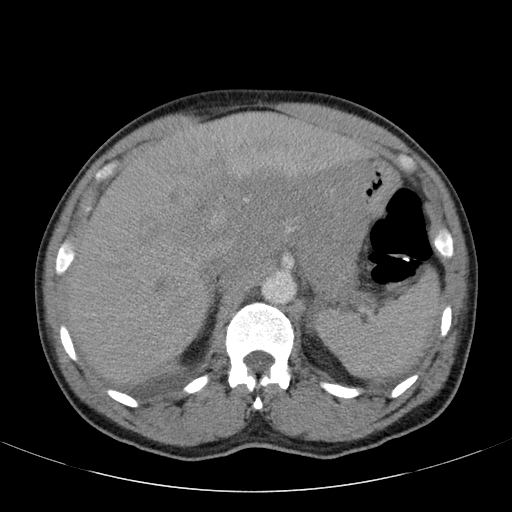
[im 5/68  lung]
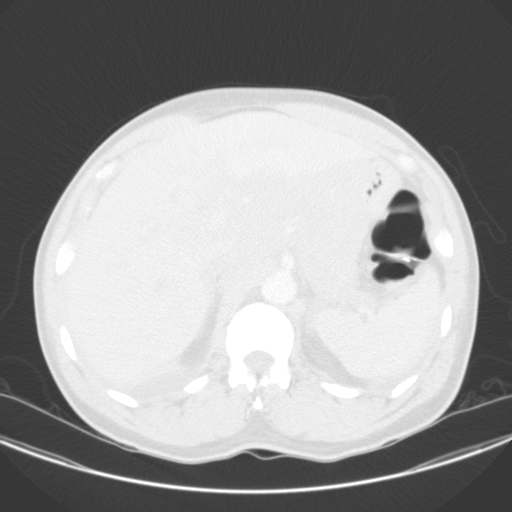
[im 13/68  lung]
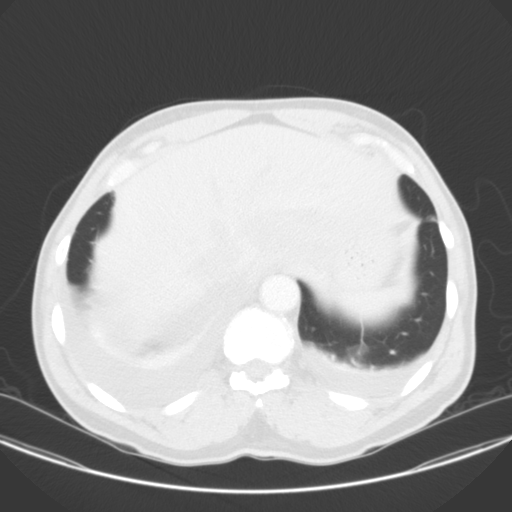
[im 23/68  lung]
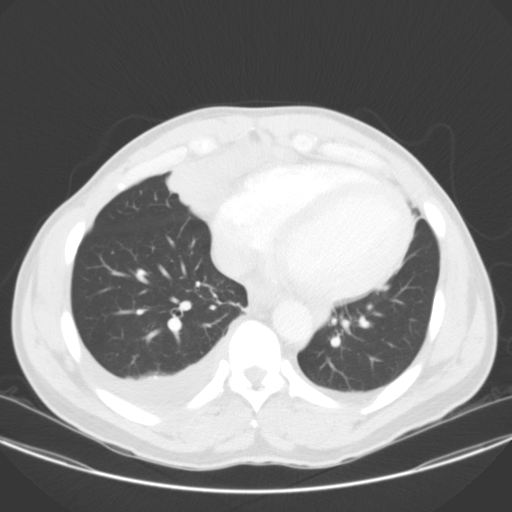
[im 30/68  lung]
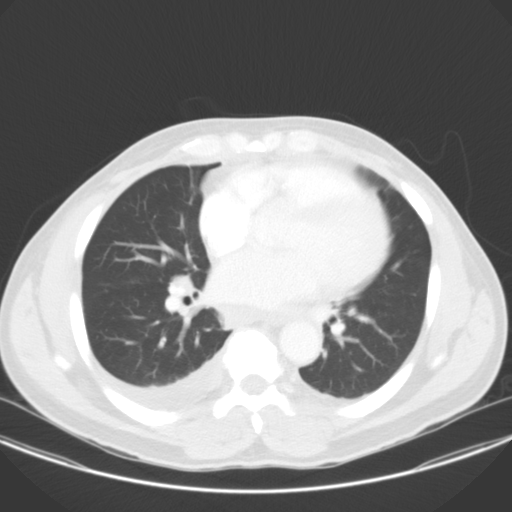
[im 38/68  mediastinal]
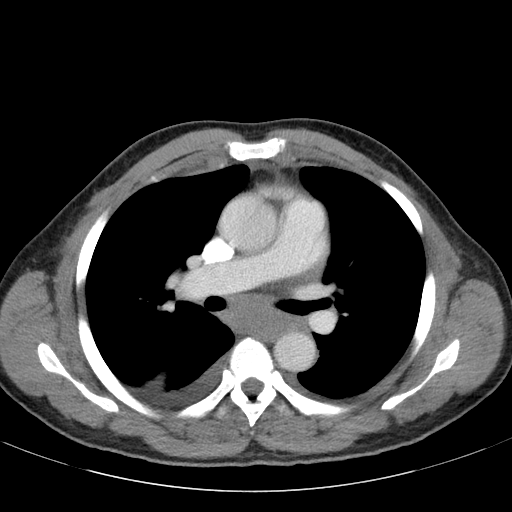
[im 38/68  lung]
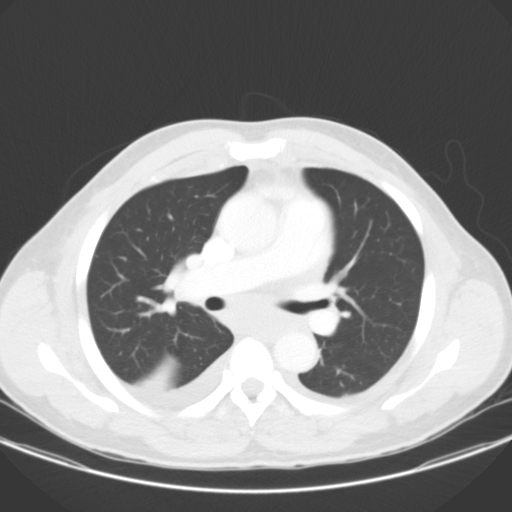
[im 45/68  lung]
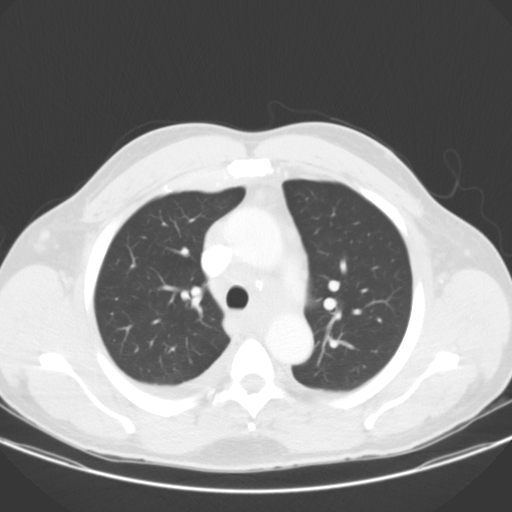
[im 55/68  lung]
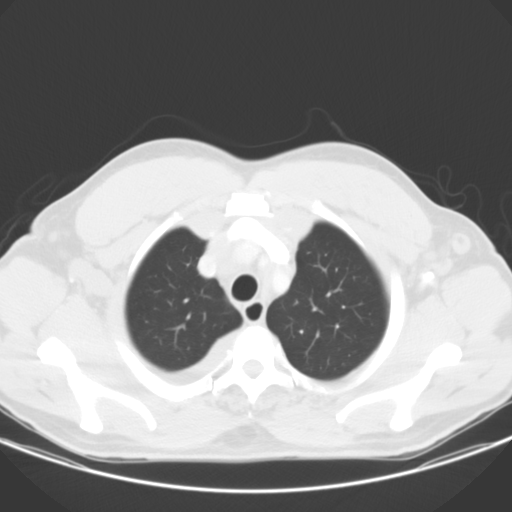
[im 63/68  lung]
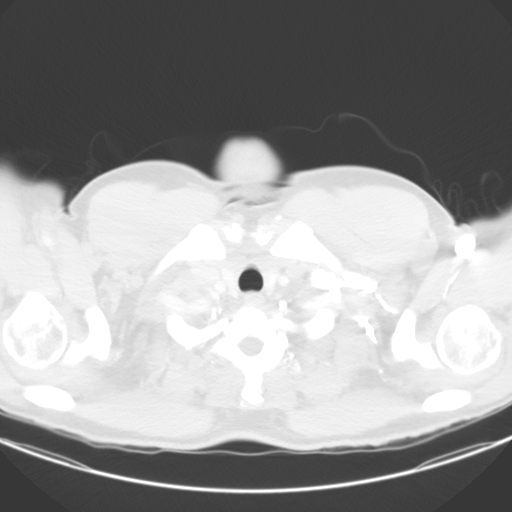

[Series 206: coronal, idose (2) · coronal · 0.45mm/px · 3 of 105 slices shown]
[im 21/105  lung]
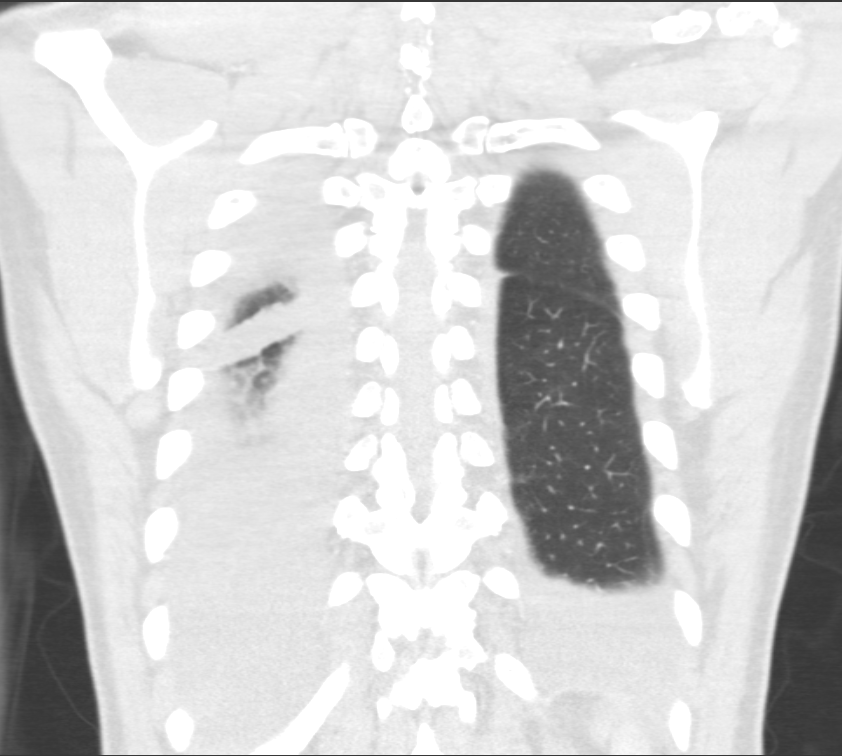
[im 42/105  lung]
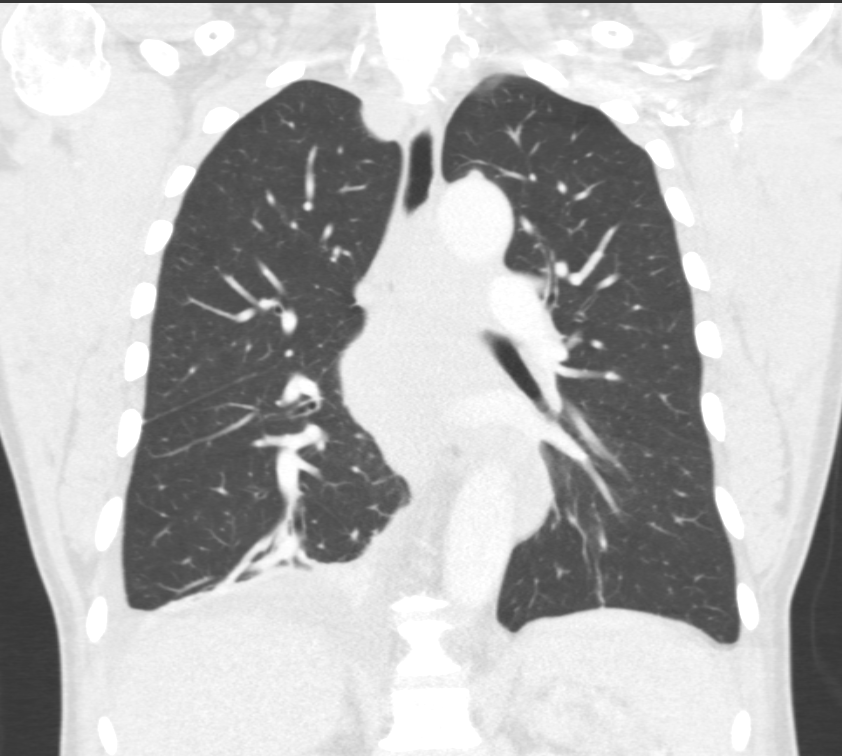
[im 63/105  lung]
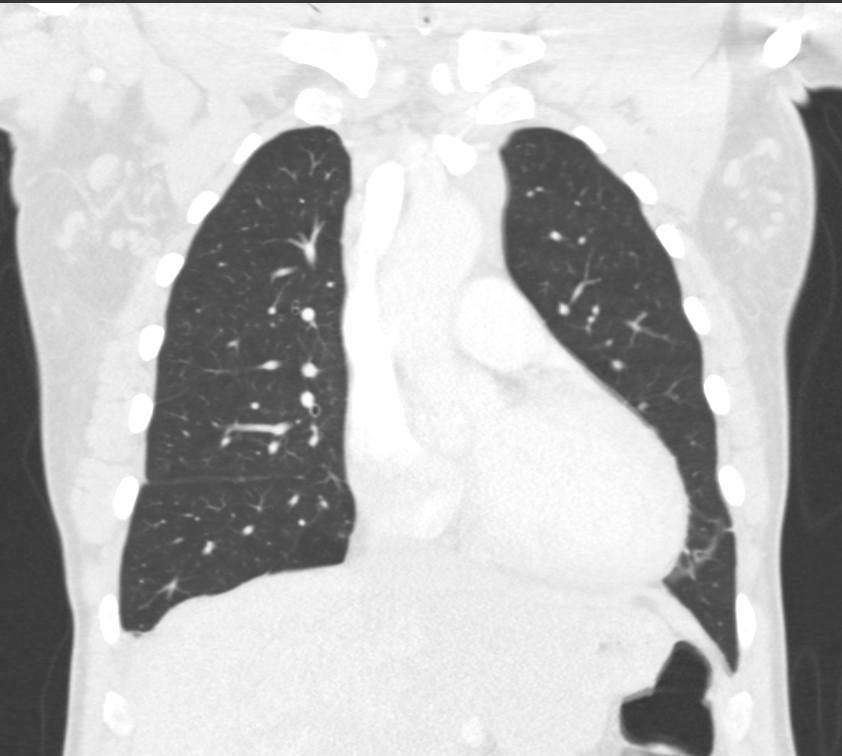

[11 of 36 positions shown; findings below may reference images not displayed]

FINDINGS: On yesterday's CT of the abdomen and pelvis, a large ulcerated mass
involving the distal stomach and proximal duodenum was identified,
with extension to the porta hepatis and gastrohepatic region,
suggesting lymphoma. Associated lymphadenopathy was identified in
the lower anterior mediastinum, retroperitoneum and pelvis, further
suggesting lymphoma. Additional lymphadenopathy was suspected in the
sub-carinal region of the mediastinum, incompletely imaged.

On today's study, conglomerate subcarinal lymphadenopathy is
confirmed, measuring 7.5 x 2.8 cm on image 33 of series 201. This
conglomerate lymphadenopathy extends upwards within the mediastinum
encasing the mid and lower portions of the trachea, without
effacement of the trachea. At the level of the lower trachea, the
conglomerate lymphadenopathy measures approximately 5.9 x 3.9 cm.

Additional lymphadenopathy within the anterior mediastinum measures
4.4 x 2.3 cm (image 17, series 201). Additional smaller lymph nodes
scattered throughout the mediastinum and bilateral perihilar
regions.

Numerous small and moderately enlarged lymph nodes are seen within
each axillary region. Clustered small and enlarged lymph nodes are
seen within the supraclavicular regions bilaterally.

There is a right pleural effusion, small to moderate in size, with
adjacent compressive atelectasis. There is also a small left pleural
effusion with adjacent atelectasis.

Very mild degenerative change noted within the cervical and thoracic
spine. No acute osseous abnormality identified.

Thoracic aorta is normal in caliber and configuration. Heart size is
normal. No pericardial effusion. No central obstructing pulmonary
embolism seen. Upper abdominal findings described on yesterday's CT.
IMPRESSION: 1. Extensive mediastinal lymphadenopathy, bilateral axillary
lymphadenopathy and supraclavicular lymphadenopathy. In conjunction
with the abnormalities described on yesterday's CT of the abdomen
and pelvis, findings do likely represent lymphoma.
2. Right pleural effusion, small to moderate in size, with adjacent
atelectasis. Additional smaller left pleural effusion.

## 2015-01-21 MED ORDER — IOHEXOL 300 MG/ML  SOLN
100.0000 mL | Freq: Once | INTRAMUSCULAR | Status: AC | PRN
  Administered 2015-01-21: 75 mL via INTRAVENOUS

## 2015-01-21 MED ORDER — LORAZEPAM 2 MG/ML IJ SOLN
0.5000 mg | Freq: Once | INTRAMUSCULAR | Status: AC
  Administered 2015-01-21: 0.5 mg via INTRAVENOUS

## 2015-01-21 NOTE — Consult Note (Signed)
[facility]  Telephone:(336) [phone] Fax:(336) [phone]     ID: [name] DOB: Nov 09, 1950  MR#: [id number]  [id number]  Patient Care Team: Chauncey Cruel, MD as Consulting Physician (Oncology) Donnie Mesa, MD as Consulting Physician (General Surgery) Wilford Corner, MD as Consulting Physician (Gastroenterology) PCP: No primary care provider on file. OTHER MD:  CHIEF COMPLAINT: GIB secondary to ulcerating duodenal mass  CURRENT TREATMENT: awaiting definitive diagnosis  HISTORY OF PRESENT ILLNESS: "[name]" was in his usual good health until early December 2016, when he had a brief syncopal episode. He "blew this off" and it did not happen again until 01/17/2015 when he felt weak at work and later fainted at home. He was brought to the ED where he was found to be guaiac positive (denies melena before that date) with a Hb of 4.5 MCV 82.2, platelets 295K, WBC 9.1 and normal INR and PTT. He was admitted and underwent EGD under Dr Michail Sermon 01/18/2015 showing a very large duodenal ulcer w clot. Abdominal US 01/19/2015 was not very informative but CT abd/pelvis 12/17,2016 showed adenopathy in the anterior mediastinum, retroperitoneum and pelvis, with a large ulcerated mass involving the distal stomach/proximal duodenum and extending into the gastrohepatic region.   CT of the chest is pending for later today. Inguinal LN biopsy has been scheduled for tomorrow. We were consulted regarding further management.  INTERVAL HISTORY: [name] was evaluated in his hospital room 01/21/2015 with his wife [name], daughter [name], [employer] and a friend also present  REVIEW OF SYSTEMS: He has had mild sweats, no drenching sweats, no weight loss, no fevers and no fatigue. Has had no nausea or vomiting, loss of appetite or altered taste, but early satiety has developed recently. Denies cough, phlegm production or pleurisy. Excercises regularly at his home gym. Rest of a detailed ROS  today was negative.  PAST MEDICAL HISTORY: Past Medical History  Diagnosis Date  . Essential hypertension   . HLD (hyperlipidemia)     PAST SURGICAL HISTORY: Past Surgical History  Procedure Laterality Date  . Skin surgery      Small benign cysts over left scalp removed  . Esophagogastroduodenoscopy Left 01/18/2015    Procedure: ESOPHAGOGASTRODUODENOSCOPY (EGD);  Surgeon: Wilford Corner, MD;  Location: [facility] ENDOSCOPY;  Service: Endoscopy;  Laterality: Left;    FAMILY HISTORY Family History  Problem Relation Age of Onset  . Hypertension Mother   . Hypertension Father   . Hypertension Sister   . Diabetes Sister   . Prostate cancer Brother   . Lupus Sister   . Kidney failure Father   The patient's father died from CHF complications age 45. The patient's mother is 84 y/o as of December 2016. The patient has 9 brothers, 8 sisters. One brother has prostate cancer. There is no other cancer history in the family to his knowledge.  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 His wife of 39 years, [name], is disabled due to RA. Daughtr [name] works as a Teacher, early years/pre for [employer] (she previously worked at the [employer] in records). Son [name] owns a window washing business. Son [name] is in Press photographer at [employer]. All live in [place]. The patient has 3 grandchildren. He attends a local [employer] DIRECTIVES: not in place   HEALTH MAINTENANCE: Social History  Substance Use Topics  . Smoking status: Never Smoker   . Smokeless tobacco: None  . Alcohol Use: No  Colonoscopy:  PSA:  Bone density:  Lipid panel:  No Known Allergies  Current Facility-Administered Medications  Medication Dose Route Frequency Provider Last Rate Last Dose  . 0.9 %  sodium chloride infusion   Intravenous Continuous Bonnielee Haff, MD 50 mL/hr at 01/20/15 1648    . calcium carbonate (TUMS - dosed in mg elemental calcium) chewable tablet 200  mg of elemental calcium  1 tablet Oral Daily PRN Ivor Costa, MD      . Derrill Memo ON 01/22/2015] ceFAZolin (ANCEF) IVPB 2 g/50 mL premix  2 g Intravenous Once Coralie Keens, MD      . hydrALAZINE (APRESOLINE) injection 5 mg  5 mg Intravenous Q2H PRN Ivor Costa, MD      . hydrOXYzine (VISTARIL) injection 25 mg  25 mg Intramuscular Q6H PRN Ivor Costa, MD      . LORazepam (ATIVAN) injection 0.5 mg  0.5 mg Intravenous Once Bonnielee Haff, MD   0.5 mg at 01/20/15 1000  . LORazepam (ATIVAN) injection 0.5 mg  0.5 mg Intravenous Once Bonnielee Haff, MD      . morphine 2 MG/ML injection 2 mg  2 mg Intravenous Q4H PRN Ivor Costa, MD      . pantoprazole (PROTONIX) 80 mg in sodium chloride 0.9 % 100 mL IVPB  80 mg Intravenous Once Wilford Corner, MD      . pantoprazole (PROTONIX) 80 mg in sodium chloride 0.9 % 250 mL (0.32 mg/mL) infusion  8 mg/hr Intravenous Continuous Wilford Corner, MD 25 mL/hr at 01/21/15 1031 8 mg/hr at 01/21/15 1031  . [START ON 01/22/2015] pantoprazole (PROTONIX) injection 40 mg  40 mg Intravenous Q12H Wilford Corner, MD      . sodium chloride 0.9 % injection 3 mL  3 mL Intravenous Q12H Ivor Costa, MD   3 mL at 01/19/15 2201    OBJECTIVE: middle aged [place] American man who appears well Filed Vitals:   01/20/15 2034 01/21/15 0454  BP: 138/84 149/85  Pulse: 80 70  Temp: 98.4 F (36.9 C) 98.2 F (36.8 C)  Resp: 18 20     Body mass index is 29.27 kg/(m^2).    ECOG FS:1 - Symptomatic but completely ambulatory  Ocular: Sclerae unicteric,EOMs intact Ear-nose-throat: Oropharynx clear and moist Lymphatic: left [place] and axillary adenopathy Lungs no rales or rhonchi, good excursion bilaterally Heart regular rate and rhythm, no murmur appreciated Abd soft, nontender, positive bowel sounds, no masses palpated MSK no joint edema Neuro: non-focal, well-oriented, appropriate affect    LAB RESULTS:  CMP     Component Value Date/Time   NA 138 01/21/2015 0422   K 3.7 01/21/2015 0422    CL 108 01/21/2015 0422   CO2 24 01/21/2015 0422   GLUCOSE 108* 01/21/2015 0422   BUN <5* 01/21/2015 0422   CREATININE 1.03 01/21/2015 0422   CALCIUM 7.9* 01/21/2015 0422   PROT 5.3* 01/21/2015 0422   ALBUMIN 2.5* 01/21/2015 0422   AST 23 01/21/2015 0422   ALT 50 01/21/2015 0422   ALKPHOS 64 01/21/2015 0422   BILITOT 0.5 01/21/2015 0422   GFRNONAA >60 01/21/2015 0422   GFRAA >60 01/21/2015 0422    INo results found for: SPEP, UPEP  Lab Results  Component Value Date   WBC 6.1 01/21/2015   NEUTROABS 7.6 01/17/2015   HGB 8.3* 01/21/2015   HCT 25.0* 01/21/2015   MCV 84.2 01/21/2015   PLT 291 01/21/2015    @LASTCHEMISTRY @  No results found for: LABCA2  No components found for: [id number]  Recent Labs Lab 01/18/15 0941  INR 1.14    Urinalysis No results found for: COLORURINE, APPEARANCEUR, LABSPEC, PHURINE, GLUCOSEU, HGBUR, BILIRUBINUR, KETONESUR, PROTEINUR, UROBILINOGEN, NITRITE, LEUKOCYTESUR  STUDIES: Ct Abdomen Pelvis W Contrast  01/20/2015  CLINICAL DATA:  GI bleed with syncopal episodes. Massive duodenal bulb ulcer by EGD. Abnormal abdominal ultrasound demonstrating heterogeneous soft tissue prominence in the region of the porta hepatis pancreatic head. EXAM: CT ABDOMEN AND PELVIS WITH CONTRAST TECHNIQUE: Multidetector CT imaging of the abdomen and pelvis was performed using the standard protocol following bolus administration of intravenous contrast. CONTRAST:  140mL OMNIPAQUE IOHEXOL 300 MG/ML  SOLN COMPARISON:  Right upper quadrant abdominal ultrasound on 01/19/2015. FINDINGS: In the lower chest, soft tissue mass in the anterior lower mediastinum abuts the pericardium and measures roughly 4.0 x 7.7 cm in axial dimensions. This is most likely consistent with a lymph node mass. The superior images also show potentially the bottom margin of subcarinal lymphadenopathy. There are small bilateral pleural effusions, right greater than left. In abdomen, a large confluent  mass is identified involving the distal stomach and proximal duodenum with visible ulceration. At the level of the proximal duodenum, thickening of the duodenal wall approaches 3.5 cm. Soft tissue mass extends into the porta hepatis and gastrohepatic region and likely tracks along portal triads into the liver. Soft tissue mass also abuts and obscures definition of the pancreatic head and proximal pancreatic body. Mass also surrounds the portal vein, portal confluence and abuts the superior mesenteric vein. There is associated retroperitoneal lymphadenopathy in the lower abdomen and pelvis. Extensive bilateral iliac chain lymphadenopathy present. Elongated external iliac lymph node masses bilaterally measure roughly 1.8 cm in short axis. Multiple mildly enlarged bilateral inguinal lymph nodes also present. 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 No evidence of bowel obstruction or free intraperitoneal air. No focal abscess is identified. The gallbladder, distal pancreas, spleen, adrenal glands and kidneys appear unremarkable. No bony lesions are seen. The bladder is decompressed. No hernias are identified. IMPRESSION: Large ulcerated mass involving the distal stomach and proximal duodenum and extending into the porta hepatis and gastrohepatic region. Associated lymphadenopathy in the lower anterior mediastinum, retroperitoneum and pelvis. Also suspected subcarinal lymphadenopathy is partially visualized. 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 Electronically Signed   By: Aletta Edouard M.D.   On: 01/20/2015 13:50   US Abdomen Limited Ruq  01/19/2015  CLINICAL DATA:  Transaminitis. History of duodenal ulcer with adherent clot. EXAM: US ABDOMEN LIMITED - RIGHT UPPER QUADRANT COMPARISON:  None. FINDINGS: Gallbladder: No gallstones or wall thickening visualized. Minimal dependent sludge. No sonographic Murphy  sign noted. Small amounts of pericholecystic free fluid. Common bile duct: Diameter: 4.2 mm. Liver: Ill-defined heterogeneous echogenicity in the region of the pancreatic head and porta hepatis, borders not well-defined. Mildly heterogeneous hepatic parenchyma. No intrahepatic biliary ductal dilatation. Normal directional flow in the main portal vein. Right pleural effusion, incidentally noted. IMPRESSION: 1. Heterogeneous echogenicity in the region of the porta hepatis and pancreatic head, difficult to separate from the adjacent liver parenchyma. This may reflect hematoma/blood products in the setting of bleeding duodenal ulcer versus pancreatic head or focal liver lesion. There is no biliary ductal dilatation. Recommend further characterization with cross-sectional imaging, contrast-enhanced CT or MRI. 2. Minimal sludge in the gallbladder, no wall thickening or gallstones. Small amount of pericholecystic fluid. No sonographic Murphy sign or findings of acute cholecystitis. Electronically Signed   By: Fonnie Birkenhead.D.  On: 01/19/2015 20:48    ASSESSMENT: 64 y.o. [place] man presenting with syncope in the setting of severe anemia, with a bleeding duodenal ulcer noted on EGD 20/15/2016 and multiple masses involving the anterior mediastinum, periportal area and retroperitonel and inguinal areas.  PLAN: We discussed cancer in general and the family understands there are more than 120 major types of cancer and until we have tissue under the microscope we cannot tell what we are dealing with, or even if it is cancer (though the imaging is most c/w this). If this started in the anterior mediastinum and then eroded locally the classic differential is lymphoma vs thymoma vs germ cell tumor. I [name] write for the appropriate markers to be drawn, and [name] also obtain iron and anemia studies.  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 We should be able to finalize the discussion at that point.  Chauncey Cruel, MD   01/21/2015 11:49 AM Medical Oncology and Hematology [facility] [address] [place], [place] [address] Tel. [phone]    Fax. [phone]

## 2015-01-21 NOTE — Progress Notes (Signed)
Patient ID: [name], male   DOB: 04-Sep-1950, 64 y.o.   MRN: [id number] [facility] Progress Note:   3 Days Post-Op  Subjective: Mental status is alert;  Feeling cooped up since he hasn't missed a day of work in 30+ years Objective: Vital signs in last 24 hours: Temp:  [97.7 F (36.5 C)-98.4 F (36.9 C)] 98.2 F (36.8 C) (12/18 0454) Pulse Rate:  [70-87] 70 (12/18 0454) Resp:  [18-20] 20 (12/18 0454) BP: (138-156)/(83-90) 149/85 mmHg (12/18 0454) SpO2:  [100 %] 100 % (12/18 0454)  Intake/Output from previous day: 12/17 0701 - 12/18 0700 In: 1794 [P.O.:1294; I.V.:500] Out: 1525 [Urine:1525] Intake/Output this shift:    Physical Exam: Work of breathing is not labored.  No pain  Lab Results:  Results for orders placed or performed during the hospital encounter of 01/17/15 (from the past 48 hour(s))  H. pylori antibody, IgG     Status: Abnormal   Collection Time: 01/19/15 10:06 AM  Result Value Ref Range   H Pylori IgG 5.4 (H) 0.0 - 0.8 U/mL    Comment: (NOTE)                             Negative            <0.9                             Indeterminate  0.9 - 1.0                             Positive            >1.0 Performed At: [facility] [place], [place] [id number] Lindon Romp MD OZ:[id number]   CBC     Status: Abnormal   Collection Time: 01/19/15  4:10 PM  Result Value Ref Range   WBC 8.8 4.0 - 10.5 K/uL   RBC 3.26 (L) 4.22 - 5.81 MIL/uL   Hemoglobin 8.7 (L) 13.0 - 17.0 g/dL   HCT 27.4 (L) 39.0 - 52.0 %   MCV 84.0 78.0 - 100.0 fL   MCH 26.7 26.0 - 34.0 pg   MCHC 31.8 30.0 - 36.0 g/dL   RDW 15.1 11.5 - 15.5 %   Platelets 369 150 - 400 K/uL  CBC     Status: Abnormal   Collection Time: 01/20/15  4:00 AM  Result Value Ref Range   WBC 6.9 4.0 - 10.5 K/uL   RBC 2.99 (L) 4.22 - 5.81 MIL/uL   Hemoglobin 8.3 (L) 13.0 - 17.0 g/dL   HCT 25.3 (L) 39.0 - 52.0 %   MCV 84.6 78.0 - 100.0 fL   MCH 27.8 26.0 - 34.0 pg   MCHC  32.8 30.0 - 36.0 g/dL   RDW 15.4 11.5 - 15.5 %   Platelets 298 150 - 400 K/uL  Glucose, capillary     Status: None   Collection Time: 01/20/15  7:29 AM  Result Value Ref Range   Glucose-Capillary 81 65 - 99 mg/dL  CBC     Status: Abnormal   Collection Time: 01/20/15  3:31 PM  Result Value Ref Range   WBC 7.5 4.0 - 10.5 K/uL   RBC 3.13 (L) 4.22 - 5.81 MIL/uL   Hemoglobin 8.8 (L) 13.0 - 17.0 g/dL   HCT 26.4 (L) 39.0 - 52.0 %  MCV 84.3 78.0 - 100.0 fL   MCH 28.1 26.0 - 34.0 pg   MCHC 33.3 30.0 - 36.0 g/dL   RDW 15.6 (H) 11.5 - 15.5 %   Platelets 322 150 - 400 K/uL  Lactate dehydrogenase     Status: Abnormal   Collection Time: 01/20/15  3:31 PM  Result Value Ref Range   LDH 213 (H) 98 - 192 U/L  CBC     Status: Abnormal   Collection Time: 01/21/15  4:22 AM  Result Value Ref Range   WBC 6.1 4.0 - 10.5 K/uL   RBC 2.97 (L) 4.22 - 5.81 MIL/uL   Hemoglobin 8.3 (L) 13.0 - 17.0 g/dL   HCT 25.0 (L) 39.0 - 52.0 %   MCV 84.2 78.0 - 100.0 fL   MCH 27.9 26.0 - 34.0 pg   MCHC 33.2 30.0 - 36.0 g/dL   RDW 16.1 (H) 11.5 - 15.5 %   Platelets 291 150 - 400 K/uL  Comprehensive metabolic panel     Status: Abnormal   Collection Time: 01/21/15  4:22 AM  Result Value Ref Range   Sodium 138 135 - 145 mmol/L   Potassium 3.7 3.5 - 5.1 mmol/L   Chloride 108 101 - 111 mmol/L   CO2 24 22 - 32 mmol/L   Glucose, Bld 108 (H) 65 - 99 mg/dL   BUN <5 (L) 6 - 20 mg/dL   Creatinine, Ser 1.03 0.61 - 1.24 mg/dL   Calcium 7.9 (L) 8.9 - 10.3 mg/dL   Total Protein 5.3 (L) 6.5 - 8.1 g/dL   Albumin 2.5 (L) 3.5 - 5.0 g/dL   AST 23 15 - 41 U/L   ALT 50 17 - 63 U/L   Alkaline Phosphatase 64 38 - 126 U/L   Total Bilirubin 0.5 0.3 - 1.2 mg/dL   GFR calc non Af Amer >60 >60 mL/min   GFR calc Af Amer >60 >60 mL/min    Comment: (NOTE) The eGFR has been calculated using the CKD EPI equation. This calculation has not been validated in all clinical situations. eGFR's persistently <60 mL/min signify possible Chronic  Kidney Disease.    Anion gap 6 5 - 15    Radiology/Results: Ct Abdomen Pelvis W Contrast  01/20/2015  CLINICAL DATA:  GI bleed with syncopal episodes. Massive duodenal bulb ulcer by EGD. Abnormal abdominal ultrasound demonstrating heterogeneous soft tissue prominence in the region of the porta hepatis pancreatic head. EXAM: CT ABDOMEN AND PELVIS WITH CONTRAST TECHNIQUE: Multidetector CT imaging of the abdomen and pelvis was performed using the standard protocol following bolus administration of intravenous contrast. CONTRAST:  157m OMNIPAQUE IOHEXOL 300 MG/ML  SOLN COMPARISON:  Right upper quadrant abdominal ultrasound on 01/19/2015. FINDINGS: In the lower chest, soft tissue mass in the anterior lower mediastinum abuts the pericardium and measures roughly 4.0 x 7.7 cm in axial dimensions. This is most likely consistent with a lymph node mass. The superior images also show potentially the bottom margin of subcarinal lymphadenopathy. There are small bilateral pleural effusions, right greater than left. In abdomen, a large confluent mass is identified involving the distal stomach and proximal duodenum with visible ulceration. At the level of the proximal duodenum, thickening of the duodenal wall approaches 3.5 cm. Soft tissue mass extends into the porta hepatis and gastrohepatic region and likely tracks along portal triads into the liver. Soft tissue mass also abuts and obscures definition of the pancreatic head and proximal pancreatic body. Mass also surrounds the portal vein, portal confluence and  abuts the superior mesenteric vein. There is associated retroperitoneal lymphadenopathy in the lower abdomen and pelvis. Extensive bilateral iliac chain lymphadenopathy present. Elongated external iliac lymph node masses bilaterally measure roughly 1.8 cm in short axis. Multiple mildly enlarged bilateral inguinal lymph nodes also present. Conglomeration of findings is most likely consistent with lymphoma with  predominately gastric and duodenal involvement. No evidence of bowel obstruction or free intraperitoneal air. No focal abscess is identified. The gallbladder, distal pancreas, spleen, adrenal glands and kidneys appear unremarkable. No bony lesions are seen. The bladder is decompressed. No hernias are identified. IMPRESSION: Large ulcerated mass involving the distal stomach and proximal duodenum and extending into the porta hepatis and gastrohepatic region. Associated lymphadenopathy in the lower anterior mediastinum, retroperitoneum and pelvis. Also suspected subcarinal lymphadenopathy is partially visualized. 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 Electronically Signed   By: Aletta Edouard M.D.   On: 01/20/2015 13:50   US Abdomen Limited Ruq  01/19/2015  CLINICAL DATA:  Transaminitis. History of duodenal ulcer with adherent clot. EXAM: US ABDOMEN LIMITED - RIGHT UPPER QUADRANT COMPARISON:  None. FINDINGS: Gallbladder: No gallstones or wall thickening visualized. Minimal dependent sludge. No sonographic Murphy sign noted. Small amounts of pericholecystic free fluid. Common bile duct: Diameter: 4.2 mm. Liver: Ill-defined heterogeneous echogenicity in the region of the pancreatic head and porta hepatis, borders not well-defined. Mildly heterogeneous hepatic parenchyma. No intrahepatic biliary ductal dilatation. Normal directional flow in the main portal vein. Right pleural effusion, incidentally noted. IMPRESSION: 1. Heterogeneous echogenicity in the region of the porta hepatis and pancreatic head, difficult to separate from the adjacent liver parenchyma. This may reflect hematoma/blood products in the setting of bleeding duodenal ulcer versus pancreatic head or focal liver lesion. There is no biliary ductal dilatation. Recommend further characterization with cross-sectional imaging, contrast-enhanced CT or MRI. 2. Minimal sludge in the gallbladder, no wall thickening or gallstones.  Small amount of pericholecystic fluid. No sonographic Murphy sign or findings of acute cholecystitis. Electronically Signed   By: Jeb Levering M.D.   On: 01/19/2015 20:48    Anti-infectives: Anti-infectives    Start     Dose/Rate Route Frequency Ordered Stop   01/22/15 0000  ceFAZolin (ANCEF) IVPB 2 g/50 mL premix    Comments:  OR holding   2 g 100 mL/hr over 30 Minutes Intravenous  Once 01/20/15 1531        Assessment/Plan: Problem List: Patient Active Problem List   Diagnosis Date Noted  . GIB (gastrointestinal bleeding) 01/17/2015  . Anemia 01/17/2015  . Essential hypertension   . HLD (hyperlipidemia)   . Acute blood loss anemia    For inguinal node biopsy per Dr. Georgette Dover tomorrow for probable lymphoma.   3 Days Post-Op    LOS: 4 days   Matt B. Hassell Done, MD, [facility] [phone] beeper [phone]  01/21/2015 9:37 AM

## 2015-01-21 NOTE — Progress Notes (Signed)
[facility] Progress Note  Subjective: No signs of GI bleeding from the ulcer at this time.  Results of CT scan noted  Patient will have a lymph node biopsy tomorrow. Lymphoma is suspected.  Objective: Vital signs in last 24 hours: Temp:  [97.7 F (36.5 C)-98.4 F (36.9 C)] 98.2 F (36.8 C) (12/18 0454) Pulse Rate:  [70-87] 70 (12/18 0454) Resp:  [18-20] 20 (12/18 0454) BP: (138-149)/(83-85) 149/85 mmHg (12/18 0454) SpO2:  [100 %] 100 % (12/18 0454) Weight change:    PE:  No distress  Heart regular rhythm  Lungs clear  Abdomen soft and nontender  Lab Results: Results for orders placed or performed during the hospital encounter of 01/17/15 (from the past 24 hour(s))  CBC     Status: Abnormal   Collection Time: 01/20/15  3:31 PM  Result Value Ref Range   WBC 7.5 4.0 - 10.5 K/uL   RBC 3.13 (L) 4.22 - 5.81 MIL/uL   Hemoglobin 8.8 (L) 13.0 - 17.0 g/dL   HCT 26.4 (L) 39.0 - 52.0 %   MCV 84.3 78.0 - 100.0 fL   MCH 28.1 26.0 - 34.0 pg   MCHC 33.3 30.0 - 36.0 g/dL   RDW 15.6 (H) 11.5 - 15.5 %   Platelets 322 150 - 400 K/uL  Lactate dehydrogenase     Status: Abnormal   Collection Time: 01/20/15  3:31 PM  Result Value Ref Range   LDH 213 (H) 98 - 192 U/L  CBC     Status: Abnormal   Collection Time: 01/21/15  4:22 AM  Result Value Ref Range   WBC 6.1 4.0 - 10.5 K/uL   RBC 2.97 (L) 4.22 - 5.81 MIL/uL   Hemoglobin 8.3 (L) 13.0 - 17.0 g/dL   HCT 25.0 (L) 39.0 - 52.0 %   MCV 84.2 78.0 - 100.0 fL   MCH 27.9 26.0 - 34.0 pg   MCHC 33.2 30.0 - 36.0 g/dL   RDW 16.1 (H) 11.5 - 15.5 %   Platelets 291 150 - 400 K/uL  Comprehensive metabolic panel     Status: Abnormal   Collection Time: 01/21/15  4:22 AM  Result Value Ref Range   Sodium 138 135 - 145 mmol/L   Potassium 3.7 3.5 - 5.1 mmol/L   Chloride 108 101 - 111 mmol/L   CO2 24 22 - 32 mmol/L   Glucose, Bld 108 (H) 65 - 99 mg/dL   BUN <5 (L) 6 - 20 mg/dL   Creatinine, Ser 1.03 0.61 - 1.24 mg/dL   Calcium  7.9 (L) 8.9 - 10.3 mg/dL   Total Protein 5.3 (L) 6.5 - 8.1 g/dL   Albumin 2.5 (L) 3.5 - 5.0 g/dL   AST 23 15 - 41 U/L   ALT 50 17 - 63 U/L   Alkaline Phosphatase 64 38 - 126 U/L   Total Bilirubin 0.5 0.3 - 1.2 mg/dL   GFR calc non Af Amer >60 >60 mL/min   GFR calc Af Amer >60 >60 mL/min   Anion gap 6 5 - 15    Studies/Results: Ct Abdomen Pelvis W Contrast  01/20/2015  CLINICAL DATA:  GI bleed with syncopal episodes. Massive duodenal bulb ulcer by EGD. Abnormal abdominal ultrasound demonstrating heterogeneous soft tissue prominence in the region of the porta hepatis pancreatic head. EXAM: CT ABDOMEN AND PELVIS WITH CONTRAST TECHNIQUE: Multidetector CT imaging of the abdomen and pelvis was performed using the standard protocol following bolus administration of intravenous contrast. CONTRAST:  149mL OMNIPAQUE IOHEXOL  300 MG/ML  SOLN COMPARISON:  Right upper quadrant abdominal ultrasound on 01/19/2015. FINDINGS: In the lower chest, soft tissue mass in the anterior lower mediastinum abuts the pericardium and measures roughly 4.0 x 7.7 cm in axial dimensions. This is most likely consistent with a lymph node mass. The superior images also show potentially the bottom margin of subcarinal lymphadenopathy. There are small bilateral pleural effusions, right greater than left. In abdomen, a large confluent mass is identified involving the distal stomach and proximal duodenum with visible ulceration. At the level of the proximal duodenum, thickening of the duodenal wall approaches 3.5 cm. Soft tissue mass extends into the porta hepatis and gastrohepatic region and likely tracks along portal triads into the liver. Soft tissue mass also abuts and obscures definition of the pancreatic head and proximal pancreatic body. Mass also surrounds the portal vein, portal confluence and abuts the superior mesenteric vein. There is associated retroperitoneal lymphadenopathy in the lower abdomen and pelvis. Extensive bilateral  iliac chain lymphadenopathy present. Elongated external iliac lymph node masses bilaterally measure roughly 1.8 cm in short axis. Multiple mildly enlarged bilateral inguinal lymph nodes also present. 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 No evidence of bowel obstruction or free intraperitoneal air. No focal abscess is identified. The gallbladder, distal pancreas, spleen, adrenal glands and kidneys appear unremarkable. No bony lesions are seen. The bladder is decompressed. No hernias are identified. IMPRESSION: Large ulcerated mass involving the distal stomach and proximal duodenum and extending into the porta hepatis and gastrohepatic region. Associated lymphadenopathy in the lower anterior mediastinum, retroperitoneum and pelvis. Also suspected subcarinal lymphadenopathy is partially visualized. 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 Electronically Signed   By: Aletta Edouard M.D.   On: 01/20/2015 13:50      Assessment: Duodenal ulcer  Rule out lymphoma  Plan:   Continue current medical plan for ulcer. Proceed with lymph node biopsy as planned.    SAM F Diontre Harps 01/21/2015, 11:35 AM  Pager: [phone] If no answer or after 5 PM call [phone]

## 2015-01-21 NOTE — Progress Notes (Signed)
[facility] PROGRESS NOTE  ALDIN VORWALD [id number] DOB: 08-13-1950 DOA: 01/17/2015  PCP: No primary care provider on file.  Brief HPI: 64 year old African-American male with a past medical history of hypertension, hyperlipidemia, medication noncompliance, presented with black stools, dizziness and syncopal episodes. Patient was found to be profoundly anemic. He had heme positive stools. He was hospitalized for further management. He underwent EGD which revealed duodenal ulcer. Abnormal LFTs noted which prompted ultrasound which revealed nonspecific findings. This prompted a CT scan of the abdomen which revealed findings suspicious for malignancy. Was found to have an ulcerating mass involving the distal stomach and duodenum. Diffuse lymphadenopathy also noted.  Past medical history:  Past Medical History  Diagnosis Date  . Essential hypertension   . HLD (hyperlipidemia)     Consultants: [facility] gastroenterology. General surgery for biopsy. Dr. Jana Hakim with oncology.  Procedures:  EGD ENDOSCOPIC IMPRESSION: Massive Duodenal Bulb Ulcer with adherent clot - high risk for rebleeding. Ulcerated pyloric channel  Plan is for a lymph node biopsy on 12/19  Antibiotics: None  Subjective: Patient feels well. Denies any complaints. Anxious about his procedure.   Objective: Vital Signs  Filed Vitals:   01/20/15 0945 01/20/15 1445 01/20/15 2034 01/21/15 0454  BP: 156/90 140/83 138/84 149/85  Pulse: 84 87 80 70  Temp: 98 F (36.7 C) 97.7 F (36.5 C) 98.4 F (36.9 C) 98.2 F (36.8 C)  TempSrc: Oral Oral Oral Oral  Resp: 18 18 18 20   Height:      Weight:      SpO2: 100% 100% 100% 100%    Intake/Output Summary (Last 24 hours) at 01/21/15 1346 Last data filed at 01/21/15 0454  Gross per 24 hour  Intake    794 ml  Output    700 ml  Net     94 ml   Filed Weights   01/17/15 1614 01/17/15 2130 01/18/15 1204  Weight: 92.987 kg (205 lb) 92.9 kg (204 lb 12.9 oz)  92.534 kg (204 lb)    General appearance: alert, cooperative, Resp: clear to auscultation bilaterally Cardio: regular rate and rhythm, S1, S2 normal, no murmur, click, rub or gallop GI: soft, non-tender; bowel sounds normal; no masses,  no organomegaly Neurologic: Alert and oriented 3. No focal neurological deficits.  Lab Results:  Basic Metabolic Panel:  Recent Labs Lab 01/17/15 1740 01/18/15 0941 01/19/15 0235 01/21/15 0422  NA 139 141 138 138  K 4.1 4.2 3.8 3.7  CL 102 111 110 108  CO2 28 24 23 24   GLUCOSE 122* 94 84 108*  BUN 43* 28* 18 <5*  CREATININE 1.22 0.93 0.97 1.03  CALCIUM 7.9* 7.9* 7.6* 7.9*   Liver Function Tests:  Recent Labs Lab 01/17/15 1740 01/18/15 0941 01/19/15 0235 01/21/15 0422  AST 38 42* 35 23  ALT 97* 84* 73* 50  ALKPHOS 82 73 62 64  BILITOT 0.3 1.4* 0.8 0.5  PROT 5.5* 5.2* 5.1* 5.3*  ALBUMIN 2.7* 2.6* 2.4* 2.5*   CBC:  Recent Labs Lab 01/17/15 1740  01/19/15 0235 01/19/15 1610 01/20/15 0400 01/20/15 1531 01/21/15 0422  WBC 9.6  < > 7.5 8.8 6.9 7.5 6.1  NEUTROABS 7.6  --   --   --   --   --   --   HGB 5.1*  < > 8.0* 8.7* 8.3* 8.8* 8.3*  HCT 16.0*  < > 23.8* 27.4* 25.3* 26.4* 25.0*  MCV 80.4  < > 83.2 84.0 84.6 84.3 84.2  PLT 343  < >  237 369 298 322 291  < > = values in this interval not displayed.  CBG:  Recent Labs Lab 01/18/15 0759 01/19/15 0806 01/20/15 0729  GLUCAP 91 71 81    Recent Results (from the past 240 hour(s))  MRSA PCR Screening     Status: None   Collection Time: 01/17/15  9:07 PM  Result Value Ref Range Status   MRSA by PCR NEGATIVE NEGATIVE Final    Comment:        The GeneXpert MRSA Assay (FDA approved for NASAL specimens only), is one component of a comprehensive MRSA colonization surveillance program. It is not intended to diagnose MRSA infection nor to guide or monitor treatment for MRSA infections.       Studies/Results: Ct Abdomen Pelvis W Contrast  01/20/2015  CLINICAL DATA:   GI bleed with syncopal episodes. Massive duodenal bulb ulcer by EGD. Abnormal abdominal ultrasound demonstrating heterogeneous soft tissue prominence in the region of the porta hepatis pancreatic head. EXAM: CT ABDOMEN AND PELVIS WITH CONTRAST TECHNIQUE: Multidetector CT imaging of the abdomen and pelvis was performed using the standard protocol following bolus administration of intravenous contrast. CONTRAST:  151mL OMNIPAQUE IOHEXOL 300 MG/ML  SOLN COMPARISON:  Right upper quadrant abdominal ultrasound on 01/19/2015. FINDINGS: In the lower chest, soft tissue mass in the anterior lower mediastinum abuts the pericardium and measures roughly 4.0 x 7.7 cm in axial dimensions. This is most likely consistent with a lymph node mass. The superior images also show potentially the bottom margin of subcarinal lymphadenopathy. There are small bilateral pleural effusions, right greater than left. In abdomen, a large confluent mass is identified involving the distal stomach and proximal duodenum with visible ulceration. At the level of the proximal duodenum, thickening of the duodenal wall approaches 3.5 cm. Soft tissue mass extends into the porta hepatis and gastrohepatic region and likely tracks along portal triads into the liver. Soft tissue mass also abuts and obscures definition of the pancreatic head and proximal pancreatic body. Mass also surrounds the portal vein, portal confluence and abuts the superior mesenteric vein. There is associated retroperitoneal lymphadenopathy in the lower abdomen and pelvis. Extensive bilateral iliac chain lymphadenopathy present. Elongated external iliac lymph node masses bilaterally measure roughly 1.8 cm in short axis. Multiple mildly enlarged bilateral inguinal lymph nodes also present. 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 No evidence of bowel obstruction or free intraperitoneal air. No focal abscess is  identified. The gallbladder, distal pancreas, spleen, adrenal glands and kidneys appear unremarkable. No bony lesions are seen. The bladder is decompressed. No hernias are identified. IMPRESSION: Large ulcerated mass involving the distal stomach and proximal duodenum and extending into the porta hepatis and gastrohepatic region. Associated lymphadenopathy in the lower anterior mediastinum, retroperitoneum and pelvis. Also suspected subcarinal lymphadenopathy is partially visualized. 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 Electronically Signed   By: Aletta Edouard M.D.   On: 01/20/2015 13:50   US Abdomen Limited Ruq  01/19/2015  CLINICAL DATA:  Transaminitis. History of duodenal ulcer with adherent clot. EXAM: US ABDOMEN LIMITED - RIGHT UPPER QUADRANT COMPARISON:  None. FINDINGS: Gallbladder: No gallstones or wall thickening visualized. Minimal dependent sludge. No sonographic Murphy sign noted. Small amounts of pericholecystic free fluid. Common bile duct: Diameter: 4.2 mm. Liver: Ill-defined heterogeneous echogenicity in the region of the pancreatic head and porta hepatis, borders not well-defined. Mildly heterogeneous hepatic parenchyma. No intrahepatic biliary ductal dilatation. Normal directional flow in the  main portal vein. Right pleural effusion, incidentally noted. IMPRESSION: 1. Heterogeneous echogenicity in the region of the porta hepatis and pancreatic head, difficult to separate from the adjacent liver parenchyma. This may reflect hematoma/blood products in the setting of bleeding duodenal ulcer versus pancreatic head or focal liver lesion. There is no biliary ductal dilatation. Recommend further characterization with cross-sectional imaging, contrast-enhanced CT or MRI. 2. Minimal sludge in the gallbladder, no wall thickening or gallstones. Small amount of pericholecystic fluid. No sonographic Murphy sign or findings of acute cholecystitis. Electronically Signed   By:  Jeb Levering M.D.   On: 01/19/2015 20:48    Medications:  Scheduled: . [START ON 01/22/2015]  ceFAZolin (ANCEF) IV  2 g Intravenous Once  . LORazepam  0.5 mg Intravenous Once  . LORazepam  0.5 mg Intravenous Once  . pantoprazole (PROTONIX) IV  80 mg Intravenous Once  . [START ON 01/22/2015] pantoprazole (PROTONIX) IV  40 mg Intravenous Q12H  . sodium chloride  3 mL Intravenous Q12H   Continuous: . sodium chloride 50 mL/hr at 01/20/15 1648  . pantoprozole (PROTONIX) infusion 8 mg/hr (01/21/15 1031)   [id number] carbonate, hydrALAZINE, hydrOXYzine, morphine injection  Assessment/Plan:  Principal Problem:   Intraabdominal mass Active Problems:   Essential hypertension   GIB (gastrointestinal bleeding)   Anemia   HLD (hyperlipidemia)   Mediastinal mass    GI bleed/large duodenal ulcer/intra-abdominal mass Most likely bled from the ulcerated mass noted on CT scan. It likely bled through the duodenum. Status post EGD 12/15. Patient is on intravenous Protonix infusion. Gastroenterology is following. Continue PPI infusion for now. Anticipate transition to oral PPI tomorrow. Hasn't had any further recurrence of bleeding. H. pylori serology is 5.4. Significance of this is unclear in view of new findings. We will let GI weigh in.  Intra-abdominal mass with lymphadenopathy And sending for malignancy. Detected on CT abdomen pelvis. CT chest is pending today. Discussed in detail with the patient. General surgery has been consulted so that they can perform a lymph node biopsy for diagnosis. Discussed also with oncology. He will need definitive management after pathology results are available.  Acute blood loss anemia Patient was transfused 4 units of blood. Hemoglobin has responded appropriately and is stable. Continue to monitor closely.  Abnormal LFTs Mildly elevated transaminases noted the last few days. Normal today. Hepatitis panel is negative. Ultrasound was inconclusive. CT of  the abdomen and pelvis as above. Tumor appears to be involving the hepatobiliary area.   History of essential hypertension Blood pressure is reasonably well controlled here. He hasn't taken any antihypertensives in 6 months.  DVT Prophylaxis: SCDs    Code Status: Full code  Family Communication: Discussed with the patient and his wife Disposition Plan: Biopsy tomorrow. Discharge anticipated by Tuesday.     LOS: 4 days   [facility] Pager [phone] 01/21/2015, 1:46 PM  If 7PM-7AM, please contact night-coverage at [email or web address], password [facility]

## 2015-01-22 ENCOUNTER — Encounter (HOSPITAL_COMMUNITY): Payer: Self-pay | Admitting: Anesthesiology

## 2015-01-22 ENCOUNTER — Encounter (HOSPITAL_COMMUNITY)
Admission: EM | Disposition: A | Discharge status: Home / Self Care | Payer: Self-pay | Source: Home / Self Care | Attending: Internal Medicine

## 2015-01-22 ENCOUNTER — Telehealth: Payer: Self-pay | Admitting: Oncology

## 2015-01-22 ENCOUNTER — Inpatient Hospital Stay (HOSPITAL_COMMUNITY): Payer: BLUE CROSS/BLUE SHIELD | Admitting: Anesthesiology

## 2015-01-22 HISTORY — PX: INGUINAL HERNIA REPAIR: SHX194

## 2015-01-22 LAB — BASIC METABOLIC PANEL
Anion gap: 7 (ref 5–15)
CHLORIDE: 106 mmol/L (ref 101–111)
CO2: 25 mmol/L (ref 22–32)
CREATININE: 0.94 mg/dL (ref 0.61–1.24)
Calcium: 8.1 mg/dL — ABNORMAL LOW (ref 8.9–10.3)
GFR calc Af Amer: >60 mL/min (ref >60)
GFR calc non Af Amer: >60 mL/min (ref >60)
GLUCOSE: 98 mg/dL (ref 65–99)
POTASSIUM: 3.8 mmol/L (ref 3.5–5.1)
SODIUM: 138 mmol/L (ref 135–145)

## 2015-01-22 LAB — SURGICAL PCR SCREEN
MRSA, PCR: NEGATIVE
STAPHYLOCOCCUS AUREUS: POSITIVE — AB

## 2015-01-22 LAB — CBC
HCT: 25.9 % — ABNORMAL LOW (ref 39.0–52.0)
HEMOGLOBIN: 8.4 g/dL — AB (ref 13.0–17.0)
MCH: 27.4 pg (ref 26.0–34.0)
MCHC: 32.4 g/dL (ref 30.0–36.0)
MCV: 84.4 fL (ref 78.0–100.0)
Platelets: 314 10*3/uL (ref 150–400)
RBC: 3.07 MIL/uL — ABNORMAL LOW (ref 4.22–5.81)
RDW: 16.1 % — ABNORMAL HIGH (ref 11.5–15.5)
WBC: 4.9 10*3/uL (ref 4.0–10.5)

## 2015-01-22 LAB — RETICULOCYTES
RBC.: 3.07 MIL/uL — AB (ref 4.22–5.81)
RETIC COUNT ABSOLUTE: 135.1 10*3/uL (ref 19.0–186.0)
Retic Ct Pct: 4.4 % — ABNORMAL HIGH (ref 0.4–3.1)

## 2015-01-22 LAB — BETA 2 MICROGLOBULIN, SERUM: Beta-2 Microglobulin: 1.9 mg/L (ref 0.6–2.4)

## 2015-01-22 LAB — FERRITIN: Ferritin: 47 ng/mL (ref 24–336)

## 2015-01-22 LAB — PSA: PSA: 2.27 ng/mL (ref 0.00–4.00)

## 2015-01-22 SURGERY — REPAIR, HERNIA, INGUINAL, ADULT
Anesthesia: General | Site: Abdomen | Laterality: Right

## 2015-01-22 MED ORDER — FENTANYL CITRATE (PF) 100 MCG/2ML IJ SOLN: 25.0000 ug | INTRAMUSCULAR | Status: DC | PRN

## 2015-01-22 MED ORDER — OMEPRAZOLE 40 MG PO CPDR: 40.0000 mg | DELAYED_RELEASE_CAPSULE | Freq: Two times a day (BID) | ORAL | Status: DC

## 2015-01-22 MED ORDER — FENTANYL CITRATE (PF) 250 MCG/5ML IJ SOLN
INTRAMUSCULAR | Status: AC
  Filled 2015-01-22: qty 5

## 2015-01-22 MED ORDER — ONDANSETRON HCL 4 MG/2ML IJ SOLN
INTRAMUSCULAR | Status: AC
  Filled 2015-01-22: qty 2

## 2015-01-22 MED ORDER — BUPIVACAINE-EPINEPHRINE 0.25% -1:200000 IJ SOLN
INTRAMUSCULAR | Status: DC | PRN
  Administered 2015-01-22: 7 mL

## 2015-01-22 MED ORDER — LIDOCAINE HCL (CARDIAC) 20 MG/ML IV SOLN
INTRAVENOUS | Status: AC
  Filled 2015-01-22: qty 5

## 2015-01-22 MED ORDER — FENTANYL CITRATE (PF) 100 MCG/2ML IJ SOLN
INTRAMUSCULAR | Status: DC | PRN
  Administered 2015-01-22: 100 ug via INTRAVENOUS
  Administered 2015-01-22: 50 ug via INTRAVENOUS
  Administered 2015-01-22: 100 ug via INTRAVENOUS

## 2015-01-22 MED ORDER — MIDAZOLAM HCL 2 MG/2ML IJ SOLN
INTRAMUSCULAR | Status: AC
  Filled 2015-01-22: qty 2

## 2015-01-22 MED ORDER — HYDROCODONE-ACETAMINOPHEN 5-325 MG PO TABS: 1.0000 | ORAL_TABLET | ORAL | Status: DC | PRN

## 2015-01-22 MED ORDER — BUPIVACAINE-EPINEPHRINE (PF) 0.25% -1:200000 IJ SOLN
INTRAMUSCULAR | Status: AC
  Filled 2015-01-22: qty 30

## 2015-01-22 MED ORDER — LIDOCAINE HCL (CARDIAC) 20 MG/ML IV SOLN
INTRAVENOUS | Status: DC | PRN
  Administered 2015-01-22: 70 mg via INTRAVENOUS

## 2015-01-22 MED ORDER — ROCURONIUM BROMIDE 50 MG/5ML IV SOLN
INTRAVENOUS | Status: AC
  Filled 2015-01-22: qty 1

## 2015-01-22 MED ORDER — MIDAZOLAM HCL 5 MG/5ML IJ SOLN
INTRAMUSCULAR | Status: DC | PRN
  Administered 2015-01-22: 2 mg via INTRAVENOUS

## 2015-01-22 MED ORDER — PROPOFOL 10 MG/ML IV BOLUS
INTRAVENOUS | Status: DC | PRN
  Administered 2015-01-22: 200 mg via INTRAVENOUS

## 2015-01-22 MED ORDER — LACTATED RINGERS IV SOLN
INTRAVENOUS | Status: DC
  Administered 2015-01-22 (×2): via INTRAVENOUS

## 2015-01-22 MED ORDER — MUPIROCIN 2 % EX OINT
1.0000 "application " | TOPICAL_OINTMENT | Freq: Two times a day (BID) | CUTANEOUS | Status: DC
  Administered 2015-01-22: 1 via NASAL
  Filled 2015-01-22: qty 22

## 2015-01-22 MED ORDER — OXYCODONE HCL 5 MG PO TABS: 5.0000 mg | ORAL_TABLET | Freq: Once | ORAL | Status: DC | PRN

## 2015-01-22 MED ORDER — ACETAMINOPHEN 160 MG/5ML PO SOLN: 325.0000 mg | ORAL | Status: DC | PRN

## 2015-01-22 MED ORDER — CHLORHEXIDINE GLUCONATE CLOTH 2 % EX PADS
6.0000 | MEDICATED_PAD | Freq: Every day | CUTANEOUS | Status: DC
  Administered 2015-01-22: 6 via TOPICAL

## 2015-01-22 MED ORDER — PROPOFOL 10 MG/ML IV BOLUS
INTRAVENOUS | Status: AC
  Filled 2015-01-22: qty 20

## 2015-01-22 MED ORDER — PHENYLEPHRINE HCL 10 MG/ML IJ SOLN
INTRAMUSCULAR | Status: DC | PRN
  Administered 2015-01-22 (×5): 80 ug via INTRAVENOUS

## 2015-01-22 MED ORDER — OXYCODONE HCL 5 MG/5ML PO SOLN: 5.0000 mg | Freq: Once | ORAL | Status: DC | PRN

## 2015-01-22 MED ORDER — ACETAMINOPHEN 325 MG PO TABS: 325.0000 mg | ORAL_TABLET | ORAL | Status: DC | PRN

## 2015-01-22 SURGICAL SUPPLY — 45 items
BENZOIN TINCTURE PRP APPL 2/3 (GAUZE/BANDAGES/DRESSINGS) ×3 IMPLANT
BLADE SURG 15 STRL LF DISP TIS (BLADE) ×1 IMPLANT
BLADE SURG 15 STRL SS (BLADE) ×2
BLADE SURG ROTATE 9660 (MISCELLANEOUS) IMPLANT
CHLORAPREP W/TINT 26ML (MISCELLANEOUS) ×3 IMPLANT
CLOSURE WOUND 1/2 X4 (GAUZE/BANDAGES/DRESSINGS) ×1
COVER SURGICAL LIGHT HANDLE (MISCELLANEOUS) ×3 IMPLANT
DRAIN PENROSE 1/2X12 LTX STRL (WOUND CARE) IMPLANT
DRAPE LAPAROSCOPIC ABDOMINAL (DRAPES) IMPLANT
DRAPE LAPAROTOMY TRNSV 102X78 (DRAPE) IMPLANT
DRAPE UTILITY XL STRL (DRAPES) ×6 IMPLANT
DRSG TEGADERM 4X4.75 (GAUZE/BANDAGES/DRESSINGS) ×3 IMPLANT
ELECT CAUTERY BLADE 6.4 (BLADE) ×3 IMPLANT
ELECT REM PT RETURN 9FT ADLT (ELECTROSURGICAL) ×3
ELECTRODE REM PT RTRN 9FT ADLT (ELECTROSURGICAL) ×1 IMPLANT
GAUZE SPONGE 2X2 8PLY NS (GAUZE/BANDAGES/DRESSINGS) ×3 IMPLANT
GAUZE SPONGE 4X4 12PLY STRL (GAUZE/BANDAGES/DRESSINGS) ×3 IMPLANT
GAUZE SPONGE 4X4 16PLY XRAY LF (GAUZE/BANDAGES/DRESSINGS) ×3 IMPLANT
GLOVE BIO SURGEON STRL SZ7 (GLOVE) ×3 IMPLANT
GLOVE BIOGEL PI IND STRL 7.5 (GLOVE) ×1 IMPLANT
GLOVE BIOGEL PI INDICATOR 7.5 (GLOVE) ×2
GOWN STRL REUS W/ TWL LRG LVL3 (GOWN DISPOSABLE) ×2 IMPLANT
GOWN STRL REUS W/TWL LRG LVL3 (GOWN DISPOSABLE) ×4
KIT BASIN OR (CUSTOM PROCEDURE TRAY) ×3 IMPLANT
KIT ROOM TURNOVER OR (KITS) ×3 IMPLANT
NEEDLE HYPO 25GX1X1/2 BEV (NEEDLE) ×3 IMPLANT
NS IRRIG 1000ML POUR BTL (IV SOLUTION) ×3 IMPLANT
PACK SURGICAL SETUP 50X90 (CUSTOM PROCEDURE TRAY) ×3 IMPLANT
PAD ARMBOARD 7.5X6 YLW CONV (MISCELLANEOUS) ×3 IMPLANT
PENCIL BUTTON HOLSTER BLD 10FT (ELECTRODE) ×3 IMPLANT
SPONGE INTESTINAL PEANUT (DISPOSABLE) ×3 IMPLANT
STRIP CLOSURE SKIN 1/2X4 (GAUZE/BANDAGES/DRESSINGS) ×2 IMPLANT
SUT MNCRL AB 4-0 PS2 18 (SUTURE) ×3 IMPLANT
SUT PDS AB 0 CT 36 (SUTURE) IMPLANT
SUT SILK 2 0 SH (SUTURE) IMPLANT
SUT SILK 3 0 (SUTURE)
SUT SILK 3-0 18XBRD TIE 12 (SUTURE) IMPLANT
SUT VIC AB 0 CT2 27 (SUTURE) ×3 IMPLANT
SUT VIC AB 2-0 SH 27 (SUTURE) ×2
SUT VIC AB 2-0 SH 27X BRD (SUTURE) ×1 IMPLANT
SUT VIC AB 3-0 SH 27 (SUTURE) ×2
SUT VIC AB 3-0 SH 27XBRD (SUTURE) ×1 IMPLANT
SYR CONTROL 10ML LL (SYRINGE) ×3 IMPLANT
TOWEL OR 17X24 6PK STRL BLUE (TOWEL DISPOSABLE) ×3 IMPLANT
TOWEL OR 17X26 10 PK STRL BLUE (TOWEL DISPOSABLE) ×3 IMPLANT

## 2015-01-22 NOTE — Transfer of Care (Signed)
Immediate Anesthesia Transfer of Care Note  Patient: [name]  Procedure(s) Performed: Procedure(s): RIGHT INGUINAL LYMPH NODE BX (Right)  Patient Location: PACU  Anesthesia Type:General  Level of Consciousness: awake, alert , oriented and patient cooperative  Airway & Oxygen Therapy: Patient Spontanous Breathing  Post-op Assessment: Report given to RN, Post -op Vital signs reviewed and stable and Patient moving all extremities  Post vital signs: Reviewed and stable  Last Vitals:  Filed Vitals:   01/22/15 0625 01/22/15 1202  BP: 150/94 131/80  Pulse: 81 74  Temp: 36.7 C 36.2 C  Resp: 18 16    Complications: No apparent anesthesia complications

## 2015-01-22 NOTE — Discharge Summary (Signed)
[facility]  Physician Discharge Summary   Patient ID: [name] MRN: [id number] DOB/AGE: 05/15/1950 64 y.o.  Admit date: 01/17/2015 Discharge date: 01/22/2015  PCP: No primary care provider on file.  DISCHARGE DIAGNOSES:  Principal Problem:   Intraabdominal mass Active Problems:   Essential hypertension   GIB (gastrointestinal bleeding)   Anemia   HLD (hyperlipidemia)   Mediastinal mass   RECOMMENDATIONS FOR OUTPATIENT FOLLOW UP: 1. Patient to follow-up with Dr. Jana Hakim on December 23 for pathology results 2. Significance of elevated H. pylori serology in the setting of possible malignancy as the reason for his presentation to be addressed as an outpatient.   DISCHARGE CONDITION: fair  Diet recommendation: Low Sodium as tolerated  Filed Weights   01/17/15 1614 01/17/15 2130 01/18/15 1204  Weight: 92.987 kg (205 lb) 92.9 kg (204 lb 12.9 oz) 92.534 kg (204 lb)    INITIAL HISTORY: 64 year old African-American male with a past medical history of hypertension, hyperlipidemia, medication noncompliance, presented with black stools, dizziness and syncopal episodes. Patient was found to be profoundly anemic. He had heme positive stools. He was hospitalized for further management. He underwent EGD which revealed duodenal ulcer. Abnormal LFTs noted which prompted ultrasound which revealed nonspecific findings. This prompted a CT scan of the abdomen which revealed findings suspicious for malignancy. Was found to have an ulcerating mass involving the distal stomach and duodenum. Diffuse lymphadenopathy also noted.  Consultants: [facility] gastroenterology. General surgery for biopsy. Dr. Jana Hakim with oncology.  Procedures:  EGD ENDOSCOPIC IMPRESSION: Massive Duodenal Bulb Ulcer with adherent clot - high risk for rebleeding. Ulcerated pyloric channel  Lymph node biopsy on 12/19  HOSPITAL COURSE:   GI bleed/large duodenal ulcer/intra-abdominal mass Most likely  bled from the ulcerated mass noted on CT scan. It likely bled through the duodenum. Status post EGD 12/15. Patient was on intravenous Protonix infusion. Gastroenterology was following. Changed to oral PPI. Patient hasn't had any more bleeding. H. pylori serology is 5.4. Significance of this is unclear in view of new findings. This will need to be addressed in the outpatient setting.  Intra-abdominal mass with lymphadenopathy Concerning for malignancy. Detected on CT abdomen pelvis. CT chest reveals diffuse lymphadenopathy. Patient was seen by oncology as well as general surgery. Patient underwent lymph node biopsy today. He will have follow-up with oncology after pathology results are available to discuss management options.   Acute blood loss anemia Patient was transfused 4 units of blood. Hemoglobin has responded appropriately and is stable.   Abnormal LFTs Mildly elevated transaminases noted the last few days. Normal subsequently. Hepatitis panel is negative. Ultrasound was inconclusive. CT of the abdomen and pelvis as below. Tumor appears to be involving the hepatobiliary area.   History of essential hypertension Blood pressure is reasonably well controlled here. He hasn't taken any antihypertensives in 6 months. Will need continued outpatient monitoring.  Overall, stable. Okay for discharge later today.    PERTINENT LABS:  The results of significant diagnostics from this hospitalization (including imaging, microbiology, ancillary and laboratory) are listed below for reference.    Microbiology: Recent Results (from the past 240 hour(s))  MRSA PCR Screening     Status: None   Collection Time: 01/17/15  9:07 PM  Result Value Ref Range Status   MRSA by PCR NEGATIVE NEGATIVE Final    Comment:        The GeneXpert MRSA Assay (FDA approved for NASAL specimens only), is one component of a comprehensive MRSA colonization surveillance program. It is not intended  to diagnose  MRSA infection nor to guide or monitor treatment for MRSA infections.   Surgical pcr screen     Status: Abnormal   Collection Time: 01/22/15  3:10 AM  Result Value Ref Range Status   MRSA, PCR NEGATIVE NEGATIVE Final   Staphylococcus aureus POSITIVE (A) NEGATIVE Final    Comment:        The Xpert SA Assay (FDA approved for NASAL specimens in patients over 32 years of age), is one component of a comprehensive surveillance program.  Test performance has been validated by [facility] for patients greater than or equal to 10 year old. It is not intended to diagnose infection nor to guide or monitor treatment.      Labs: Basic Metabolic Panel:  Recent Labs Lab 01/17/15 1740 01/18/15 0941 01/19/15 0235 01/21/15 0422 01/22/15 0400  NA 139 141 138 138 138  K 4.1 4.2 3.8 3.7 3.8  CL 102 111 110 108 106  CO2 28 24 23 24 25   GLUCOSE 122* 94 84 108* 98  BUN 43* 28* 18 <5* <5*  CREATININE 1.22 0.93 0.97 1.03 0.94  CALCIUM 7.9* 7.9* 7.6* 7.9* 8.1*   Liver Function Tests:  Recent Labs Lab 01/17/15 1740 01/18/15 0941 01/19/15 0235 01/21/15 0422  AST 38 42* 35 23  ALT 97* 84* 73* 50  ALKPHOS 82 73 62 64  BILITOT 0.3 1.4* 0.8 0.5  PROT 5.5* 5.2* 5.1* 5.3*  ALBUMIN 2.7* 2.6* 2.4* 2.5*   CBC:  Recent Labs Lab 01/17/15 1740  01/20/15 0400 01/20/15 1531 01/21/15 0422 01/21/15 1617 01/22/15 0400  WBC 9.6  < > 6.9 7.5 6.1 6.1 4.9  NEUTROABS 7.6  --   --   --   --   --   --   HGB 5.1*  < > 8.3* 8.8* 8.3* 8.6* 8.4*  HCT 16.0*  < > 25.3* 26.4* 25.0* 26.9* 25.9*  MCV 80.4  < > 84.6 84.3 84.2 85.1 84.4  PLT 343  < > 298 322 291 360 314  < > = values in this interval not displayed.  CBG:  Recent Labs Lab 01/18/15 0759 01/19/15 0806 01/20/15 0729  GLUCAP 91 71 81     IMAGING STUDIES Ct Chest W Contrast  01/21/2015  CLINICAL DATA:  Mediastinal mass. EXAM: CT CHEST WITH CONTRAST TECHNIQUE: Multidetector CT imaging of the chest was performed during intravenous  contrast administration. CONTRAST:  58mL OMNIPAQUE IOHEXOL 300 MG/ML  SOLN COMPARISON:  CT abdomen and pelvis dated 01/20/2015. FINDINGS: On yesterday's CT of the abdomen and pelvis, a large ulcerated mass involving the distal stomach and proximal duodenum was identified, with extension to the porta hepatis and gastrohepatic region, suggesting lymphoma. Associated lymphadenopathy was identified in the lower anterior mediastinum, retroperitoneum and pelvis, further suggesting lymphoma. Additional lymphadenopathy was suspected in the sub-carinal region of the mediastinum, incompletely imaged. On today's study, conglomerate subcarinal lymphadenopathy is confirmed, measuring 7.5 x 2.8 cm on image 33 of series 201. This conglomerate lymphadenopathy extends upwards within the mediastinum encasing the mid and lower portions of the trachea, without effacement of the trachea. At the level of the lower trachea, the conglomerate lymphadenopathy measures approximately 5.9 x 3.9 cm. Additional lymphadenopathy within the anterior mediastinum measures 4.4 x 2.3 cm (image 17, series 201). Additional smaller lymph nodes scattered throughout the mediastinum and bilateral perihilar regions. Numerous small and moderately enlarged lymph nodes are seen within each axillary region. Clustered small and enlarged lymph nodes are seen within the supraclavicular regions  bilaterally. There is a right pleural effusion, small to moderate in size, with adjacent compressive atelectasis. There is also a small left pleural effusion with adjacent atelectasis. Very mild degenerative change noted within the cervical and thoracic spine. No acute osseous abnormality identified. Thoracic aorta is normal in caliber and configuration. Heart size is normal. No pericardial effusion. No central obstructing pulmonary embolism seen. Upper abdominal findings described on yesterday's CT. IMPRESSION: 1. Extensive mediastinal lymphadenopathy, bilateral axillary  lymphadenopathy and supraclavicular lymphadenopathy. 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 2. Right pleural effusion, small to moderate in size, with adjacent atelectasis. Additional smaller left pleural effusion. Electronically Signed   By: Franki Cabot M.D.   On: 01/21/2015 16:47   Ct Abdomen Pelvis W Contrast  01/20/2015  CLINICAL DATA:  GI bleed with syncopal episodes. Massive duodenal bulb ulcer by EGD. Abnormal abdominal ultrasound demonstrating heterogeneous soft tissue prominence in the region of the porta hepatis pancreatic head. EXAM: CT ABDOMEN AND PELVIS WITH CONTRAST TECHNIQUE: Multidetector CT imaging of the abdomen and pelvis was performed using the standard protocol following bolus administration of intravenous contrast. CONTRAST:  133mL OMNIPAQUE IOHEXOL 300 MG/ML  SOLN COMPARISON:  Right upper quadrant abdominal ultrasound on 01/19/2015. FINDINGS: In the lower chest, soft tissue mass in the anterior lower mediastinum abuts the pericardium and measures roughly 4.0 x 7.7 cm in axial dimensions. This is most likely consistent with a lymph node mass. The superior images also show potentially the bottom margin of subcarinal lymphadenopathy. There are small bilateral pleural effusions, right greater than left. In abdomen, a large confluent mass is identified involving the distal stomach and proximal duodenum with visible ulceration. At the level of the proximal duodenum, thickening of the duodenal wall approaches 3.5 cm. Soft tissue mass extends into the porta hepatis and gastrohepatic region and likely tracks along portal triads into the liver. Soft tissue mass also abuts and obscures definition of the pancreatic head and proximal pancreatic body. Mass also surrounds the portal vein, portal confluence and abuts the superior mesenteric vein. There is associated retroperitoneal lymphadenopathy in the lower  abdomen and pelvis. Extensive bilateral iliac chain lymphadenopathy present. Elongated external iliac lymph node masses bilaterally measure roughly 1.8 cm in short axis. Multiple mildly enlarged bilateral inguinal lymph nodes also present. 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 No evidence of bowel obstruction or free intraperitoneal air. No focal abscess is identified. The gallbladder, distal pancreas, spleen, adrenal glands and kidneys appear unremarkable. No bony lesions are seen. The bladder is decompressed. No hernias are identified. IMPRESSION: Large ulcerated mass involving the distal stomach and proximal duodenum and extending into the porta hepatis and gastrohepatic region. Associated lymphadenopathy in the lower anterior mediastinum, retroperitoneum and pelvis. Also suspected subcarinal lymphadenopathy is partially visualized. 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 Electronically Signed   By: Aletta Edouard M.D.   On: 01/20/2015 13:50   US Abdomen Limited Ruq  01/19/2015  CLINICAL DATA:  Transaminitis. History of duodenal ulcer with adherent clot. EXAM: US ABDOMEN LIMITED - RIGHT UPPER QUADRANT COMPARISON:  None. FINDINGS: Gallbladder: No gallstones or wall thickening visualized. Minimal dependent sludge. No sonographic Murphy sign noted. Small amounts of pericholecystic free fluid. Common bile duct: Diameter: 4.2 mm. Liver: Ill-defined heterogeneous echogenicity in the region of the pancreatic head and porta hepatis, borders not well-defined. Mildly heterogeneous hepatic parenchyma. No intrahepatic biliary ductal dilatation.  Normal directional flow in the main portal vein. Right pleural effusion, incidentally noted. IMPRESSION: 1. Heterogeneous echogenicity in the region of the porta hepatis and pancreatic head, difficult to separate from the adjacent liver parenchyma. This may reflect  hematoma/blood products in the setting of bleeding duodenal ulcer versus pancreatic head or focal liver lesion. There is no biliary ductal dilatation. Recommend further characterization with cross-sectional imaging, contrast-enhanced CT or MRI. 2. Minimal sludge in the gallbladder, no wall thickening or gallstones. Small amount of pericholecystic fluid. No sonographic Murphy sign or findings of acute cholecystitis. Electronically Signed   By: Jeb Levering M.D.   On: 01/19/2015 20:48    DISCHARGE EXAMINATION: Filed Vitals:   01/22/15 1202 01/22/15 1215 01/22/15 1230 01/22/15 1244  BP: 131/80 130/86 122/82 137/94  Pulse: 74 64 63 60  Temp: 97.1 F (36.2 C)   97.2 F (36.2 C)  TempSrc:      Resp: 16 18 16 13   Height:      Weight:      SpO2: 94% 95% 98% 99%   General appearance: alert, cooperative, appears stated age and no distress Resp: clear to auscultation bilaterally Cardio: regular rate and rhythm, S1, S2 normal, no murmur, click, rub or gallop GI: soft, non-tender; bowel sounds normal; no masses,  no organomegaly Extremities: extremities normal, atraumatic, no cyanosis or edema  DISPOSITION: Home  Discharge Instructions    Call MD for:  difficulty breathing, headache or visual disturbances    Complete by:  As directed      Call MD for:  extreme fatigue    Complete by:  As directed      Call MD for:  persistant dizziness or light-headedness    Complete by:  As directed      Call MD for:  persistant nausea and vomiting    Complete by:  As directed      Call MD for:  severe uncontrolled pain    Complete by:  As directed      Call MD for:  temperature >100.4    Complete by:  As directed      Diet - low sodium heart healthy    Complete by:  As directed      Discharge instructions    Complete by:  As directed   Please follow up with Dr. Jana Hakim as instructed. Please call his office tomorrow to confirm appointment. Please seek attention if you notice black stools or blood in  the stools again.  You were cared for by a hospitalist during your hospital stay. If you have any questions about your discharge medications or the care you received while you were in the hospital after you are discharged, you can call the unit and asked to speak with the hospitalist on call if the hospitalist that took care of you is not available. Once you are discharged, your primary care physician will handle any further medical issues. Please note that NO REFILLS for any discharge medications will be authorized once you are discharged, as it is imperative that you return to your primary care physician (or establish a relationship with a primary care physician if you do not have one) for your aftercare needs so that they can reassess your need for medications and monitor your lab values. If you do not have a primary care physician, you can call [phone] for a physician referral.     Increase activity slowly    Complete by:  As directed  ALLERGIES: No Known Allergies   Current Discharge Medication List    START taking these medications   Details  omeprazole (PRILOSEC) 40 MG capsule Take 1 capsule (40 mg total) by mouth 2 (two) times daily. Qty: 60 capsule, Refills: 3      STOP taking these medications     calcium carbonate (TUMS - DOSED IN MG ELEMENTAL CALCIUM) 500 MG chewable tablet      lisinopril-hydrochlorothiazide (PRINZIDE,ZESTORETIC) 20-25 MG tablet      lovastatin (MEVACOR) 20 MG tablet      sodium-potassium bicarbonate (ALKA-SELTZER GOLD) TBEF dissolvable tablet        Follow-up Information    Follow up with Chauncey Cruel, MD On 01/26/2015.   Specialty:  Oncology   Why:  at [facility]. Please call his office tomorrow to confirm the appointment.   Contact information:   [place] [address] [phone]       Follow up with Maia Petties., MD. Schedule an appointment as soon as possible for a visit in 3 weeks.   Specialty:  General  Surgery   Contact information:   [place] [address] [phone]       TOTAL DISCHARGE TIME: 35 minutes  [facility] Pager [phone]  01/22/2015, 3:13 PM

## 2015-01-22 NOTE — Anesthesia Procedure Notes (Signed)
Procedure Name: LMA Insertion Date/Time: 01/22/2015 11:19 AM Performed by: Greggory Stallion, Chevelle Coulson L Pre-anesthesia Checklist: Patient identified, Emergency Drugs available, Suction available, Patient being monitored and Timeout performed Patient Re-evaluated:Patient Re-evaluated prior to inductionOxygen Delivery Method: Circle system utilized Preoxygenation: Pre-oxygenation with 100% oxygen Intubation Type: IV induction Ventilation: Mask ventilation with difficulty LMA: LMA inserted LMA Size: 5.0 Number of attempts: 1 Placement Confirmation: positive ETCO2 and breath sounds checked- equal and bilateral Tube secured with: Tape Dental Injury: Teeth and Oropharynx as per pre-operative assessment

## 2015-01-22 NOTE — Progress Notes (Signed)
4 Days Post-Op  Subjective: Very pleasant patient - feeling well Has been NPO since midnight   Objective: Vital signs in last 24 hours: Temp:  [98 F (36.7 C)-98.3 F (36.8 C)] 98 F (36.7 C) (12/19 0625) Pulse Rate:  [75-81] 81 (12/19 0625) Resp:  [18] 18 (12/19 0625) BP: (142-154)/(89-94) 150/94 mmHg (12/19 0625) SpO2:  [99 %-100 %] 100 % (12/19 0625) Last BM Date: 01/20/15  Intake/Output from previous day: 12/18 0701 - 12/19 0700 In: 362 [P.O.:362] Out: -  Intake/Output this shift: Total I/O In: 1498.3 [I.V.:1498.3] Out: -   General appearance: alert, cooperative and no distress Significant easily palpable bilateral inguinal lymphadenopathy  Lab Results:   Recent Labs  01/21/15 1617 01/22/15 0400  WBC 6.1 4.9  HGB 8.6* 8.4*  HCT 26.9* 25.9*  PLT 360 314   BMET  Recent Labs  01/21/15 0422 01/22/15 0400  NA 138 138  K 3.7 3.8  CL 108 106  CO2 24 25  GLUCOSE 108* 98  BUN <5* <5*  CREATININE 1.03 0.94  CALCIUM 7.9* 8.1*   PT/INR No results for input(s): LABPROT, INR in the last 72 hours. ABG No results for input(s): PHART, HCO3 in the last 72 hours.  Invalid input(s): PCO2, PO2  Studies/Results: Ct Chest W Contrast  01/21/2015  CLINICAL DATA:  Mediastinal mass. EXAM: CT CHEST WITH CONTRAST TECHNIQUE: Multidetector CT imaging of the chest was performed during intravenous contrast administration. CONTRAST:  81mL OMNIPAQUE IOHEXOL 300 MG/ML  SOLN COMPARISON:  CT abdomen and pelvis dated 01/20/2015. FINDINGS: On yesterday's CT of the abdomen and pelvis, a large ulcerated mass involving the distal stomach and proximal duodenum was identified, with extension to the porta hepatis and gastrohepatic region, suggesting lymphoma. Associated lymphadenopathy was identified in the lower anterior mediastinum, retroperitoneum and pelvis, further suggesting lymphoma. Additional lymphadenopathy was suspected in the sub-carinal region of the mediastinum, incompletely  imaged. On today's study, conglomerate subcarinal lymphadenopathy is confirmed, measuring 7.5 x 2.8 cm on image 33 of series 201. This conglomerate lymphadenopathy extends upwards within the mediastinum encasing the mid and lower portions of the trachea, without effacement of the trachea. At the level of the lower trachea, the conglomerate lymphadenopathy measures approximately 5.9 x 3.9 cm. Additional lymphadenopathy within the anterior mediastinum measures 4.4 x 2.3 cm (image 17, series 201). Additional smaller lymph nodes scattered throughout the mediastinum and bilateral perihilar regions. Numerous small and moderately enlarged lymph nodes are seen within each axillary region. Clustered small and enlarged lymph nodes are seen within the supraclavicular regions bilaterally. There is a right pleural effusion, small to moderate in size, with adjacent compressive atelectasis. There is also a small left pleural effusion with adjacent atelectasis. Very mild degenerative change noted within the cervical and thoracic spine. No acute osseous abnormality identified. Thoracic aorta is normal in caliber and configuration. Heart size is normal. No pericardial effusion. No central obstructing pulmonary embolism seen. Upper abdominal findings described on yesterday's CT. IMPRESSION: 1. Extensive mediastinal lymphadenopathy, bilateral axillary lymphadenopathy and supraclavicular lymphadenopathy. 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 2. Right pleural effusion, small to moderate in size, with adjacent atelectasis. Additional smaller left pleural effusion. Electronically Signed   By: Franki Cabot M.D.   On: 01/21/2015 16:47   Ct Abdomen Pelvis W Contrast  01/20/2015  CLINICAL DATA:  GI bleed with syncopal episodes. Massive duodenal bulb ulcer by EGD. Abnormal abdominal ultrasound demonstrating heterogeneous soft tissue prominence in  the region of the porta hepatis pancreatic head. EXAM: CT ABDOMEN AND PELVIS WITH CONTRAST TECHNIQUE: Multidetector CT imaging of the abdomen and pelvis was performed using the standard protocol following bolus administration of intravenous contrast. CONTRAST:  122mL OMNIPAQUE IOHEXOL 300 MG/ML  SOLN COMPARISON:  Right upper quadrant abdominal ultrasound on 01/19/2015. FINDINGS: In the lower chest, soft tissue mass in the anterior lower mediastinum abuts the pericardium and measures roughly 4.0 x 7.7 cm in axial dimensions. This is most likely consistent with a lymph node mass. The superior images also show potentially the bottom margin of subcarinal lymphadenopathy. There are small bilateral pleural effusions, right greater than left. In abdomen, a large confluent mass is identified involving the distal stomach and proximal duodenum with visible ulceration. At the level of the proximal duodenum, thickening of the duodenal wall approaches 3.5 cm. Soft tissue mass extends into the porta hepatis and gastrohepatic region and likely tracks along portal triads into the liver. Soft tissue mass also abuts and obscures definition of the pancreatic head and proximal pancreatic body. Mass also surrounds the portal vein, portal confluence and abuts the superior mesenteric vein. There is associated retroperitoneal lymphadenopathy in the lower abdomen and pelvis. Extensive bilateral iliac chain lymphadenopathy present. Elongated external iliac lymph node masses bilaterally measure roughly 1.8 cm in short axis. Multiple mildly enlarged bilateral inguinal lymph nodes also present. 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 No evidence of bowel obstruction or free intraperitoneal air. No focal abscess is identified. The gallbladder, distal pancreas, spleen, adrenal glands and kidneys appear unremarkable. No bony lesions are seen. The bladder is  decompressed. No hernias are identified. IMPRESSION: Large ulcerated mass involving the distal stomach and proximal duodenum and extending into the porta hepatis and gastrohepatic region. Associated lymphadenopathy in the lower anterior mediastinum, retroperitoneum and pelvis. Also suspected subcarinal lymphadenopathy is partially visualized. 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 Electronically Signed   By: Aletta Edouard M.D.   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  Has been seen by Dr. Jana Hakim with follow-up scheduled for 12/23.  Patient will likely be ready for discharge later today after LN biopsy.  LOS: 5 days    Gary Catterton K. 01/22/2015

## 2015-01-22 NOTE — Progress Notes (Signed)
IV removed. AVS given to patient.  Belongings packed. Transportation arranged by patient. Per MD, Dr. Maryland Pink, patient will leave no earlier than 1630 to ensure that patient feels OK to go home. Understanding verbalized by patient.

## 2015-01-22 NOTE — Anesthesia Preprocedure Evaluation (Signed)
Anesthesia Evaluation  Patient identified by MRN, date of birth, ID band Patient awake    Reviewed: Allergy & Precautions, NPO status , Patient's Chart, lab work & pertinent test results  History of Anesthesia Complications Negative for: history of anesthetic complications  Airway Mallampati: II  TM Distance: >3 FB Neck ROM: Full    Dental  (+) Teeth Intact   Pulmonary neg pulmonary ROS,    breath sounds clear to auscultation       Cardiovascular hypertension, Pt. on medications (-) angina(-) CHF (-) dysrhythmias  Rhythm:Regular     Neuro/Psych negative neurological ROS  negative psych ROS   GI/Hepatic Neg liver ROS, Gastric ulcer with associated anemia requiring transfusion   Endo/Other  negative endocrine ROS  Renal/GU negative Renal ROS     Musculoskeletal   Abdominal   Peds  Hematology  (+) anemia ,   Anesthesia Other Findings   Reproductive/Obstetrics                             Anesthesia Physical Anesthesia Plan  ASA: III  Anesthesia Plan: General   Post-op Pain Management:    Induction: Intravenous  Airway Management Planned: LMA  Additional Equipment: None  Intra-op Plan:   Post-operative Plan: Extubation in OR  Informed Consent: I have reviewed the patients History and Physical, chart, labs and discussed the procedure including the risks, benefits and alternatives for the proposed anesthesia with the patient or authorized representative who has indicated his/her understanding and acceptance.   Dental advisory given  Plan Discussed with: Surgeon and CRNA  Anesthesia Plan Comments:         Anesthesia Quick Evaluation

## 2015-01-22 NOTE — Progress Notes (Signed)
Patient currently in the OR and his case was discussed with his nurse as well as my partner Dr. Penelope Coop and his endoscopy pictures and CT scan was reviewed and will await node biopsy and please let us know if we can be of any further assistance during this hospital stay

## 2015-01-22 NOTE — Telephone Encounter (Signed)
Called and left a message for [name]/HIM with pof information

## 2015-01-22 NOTE — Discharge Instructions (Signed)
Right inguinal lymph node biopsy  You may place an ice pack over the dressing.  Remove the dressing on Wednesday, then you may shower over the steri-strips.  The steri-strips will come off on their own in the next 1-2 weeks.  The area will become fairly firm at first, then will soften over time.  No limits on activity.  Follow-up with Dr. Jana Hakim to discuss the pathology results.    Gastrointestinal Bleeding Gastrointestinal (GI) bleeding means there is bleeding somewhere along the digestive tract, between the mouth and anus. CAUSES  There are many different problems that can cause GI bleeding. Possible causes include:  Esophagitis. This is inflammation, irritation, or swelling of the esophagus.  Hemorrhoids.These are veins that are full of blood (engorged) in the rectum. They cause pain, inflammation, and may bleed.  Anal fissures.These are areas of painful tearing which may bleed. They are often caused by passing hard stool.  Diverticulosis.These are pouches that form on the colon over time, with age, and may bleed significantly.  Diverticulitis.This is inflammation in areas with diverticulosis. It can cause pain, fever, and bloody stools, although bleeding is rare.  Polyps and cancer. Colon cancer often starts out as precancerous polyps.  Gastritis and ulcers.Bleeding from the upper gastrointestinal tract (near the stomach) may travel through the intestines and produce black, sometimes tarry, often bad smelling stools. In certain cases, if the bleeding is fast enough, the stools may not be black, but red. This condition may be life-threatening. SYMPTOMS   Vomiting bright red blood or material that looks like coffee grounds.  Bloody, black, or tarry stools. DIAGNOSIS  Your caregiver may diagnose your condition by taking your history and performing a physical exam. More tests may be needed, including:  X-rays and other imaging tests.  Esophagogastroduodenoscopy (EGD). This  test uses a flexible, lighted tube to look at your esophagus, stomach, and small intestine.  Colonoscopy. This test uses a flexible, lighted tube to look at your colon. TREATMENT  Treatment depends on the cause of your bleeding.   For bleeding from the esophagus, stomach, small intestine, or colon, the caregiver doing your EGD or colonoscopy may be able to stop the bleeding as part of the procedure.  Inflammation or infection of the colon can be treated with medicines.  Many rectal problems can be treated with creams, suppositories, or warm baths.  Surgery is sometimes needed.  Blood transfusions are sometimes needed if you have lost a lot of blood. If bleeding is slow, you may be allowed to go home. If there is a lot of bleeding, you will need to stay in the hospital for observation. HOME CARE INSTRUCTIONS   Take any medicines exactly as prescribed.  Keep your stools soft by eating foods that are high in fiber. These foods include whole grains, legumes, fruits, and vegetables. Prunes (1 to 3 a day) work well for many people.  Drink enough fluids to keep your urine clear or pale yellow. SEEK IMMEDIATE MEDICAL CARE IF:   Your bleeding increases.  You feel lightheaded, weak, or you faint.  You have severe cramps in your back or abdomen.  You pass large blood clots in your stool.  Your problems are getting worse. MAKE SURE YOU:   Understand these instructions.  Will watch your condition.  Will get help right away if you are not doing well or get worse.   This information is not intended to replace advice given to you by your health care provider. Make sure you discuss  any questions you have with your health care provider.   Document Released: 01/18/2000 Document Revised: 01/07/2012 Document Reviewed: 07/10/2014 Elsevier Interactive Patient Education [employer].

## 2015-01-22 NOTE — Op Note (Signed)
Preop diagnosis: Generalized lymphadenopathy Postop diagnosis: Same Procedure performed: Right inguinal lymph node biopsy Surgeon:Nakiya Rallis K. Anesthesia: Gen. via LMA Indications: This is a 64 year old male who presented with weakness and anemia was found to have a large duodenal ulcer. Further workup showed that he had generalized lymphadenopathy extending from the mediastinum all the way down into his groin. 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 We are asked to remove a lymph node from his groin to assist in diagnosis and treatment planning.  Description of procedure: The patient brought to the operating room placed in supine position on the operating table. After adequate level of general anesthesia was obtained, his right groin was shaved, prepped with ChloraPrep and draped sterile fashion. A timeout was taken to ensure the proper patient and proper procedure. The lymph nodes easily palpable in his groin. We selected the largest one. We infiltrated the area over this lump with 0.25% Marcaine with epinephrine.  A 2 cm incision was made. Dissection was carried down through subcutaneous tissues to the surface of the lymph node. We bluntly dissect around the lymph node. Cautery was used to excise the lymph node entirely. No bleeding was noted. The lymph node was sent for pathologic examination under the lymph node protocol. We inspected again for hemostasis. The wound was closed with 3-0 Vicryl and 4-0 Monocryl. Steri-Strips and clean dressings were applied. The patient was then next made about the recovery room stable condition. All sponge, initially, and needle counts are correct.  Imogene Burn. Georgette Dover, MD, [facility] Surgery  General/ Trauma Surgery  01/22/2015 11:56 AM

## 2015-01-23 ENCOUNTER — Telehealth: Payer: Self-pay | Admitting: Oncology

## 2015-01-23 ENCOUNTER — Inpatient Hospital Stay (HOSPITAL_COMMUNITY)
Admission: EM | Admit: 2015-01-23 | Discharge: 2015-01-27 | DRG: 377 | Disposition: A | Discharge status: Home / Self Care | Payer: BLUE CROSS/BLUE SHIELD | Attending: Internal Medicine | Admitting: Internal Medicine

## 2015-01-23 ENCOUNTER — Emergency Department (HOSPITAL_COMMUNITY): Payer: BLUE CROSS/BLUE SHIELD

## 2015-01-23 ENCOUNTER — Inpatient Hospital Stay (HOSPITAL_COMMUNITY): Payer: BLUE CROSS/BLUE SHIELD

## 2015-01-23 ENCOUNTER — Encounter (HOSPITAL_COMMUNITY): Payer: Self-pay | Admitting: Adult Health

## 2015-01-23 DIAGNOSIS — R55 Syncope and collapse: Secondary | ICD-10-CM | POA: Diagnosis present

## 2015-01-23 DIAGNOSIS — D62 Acute posthemorrhagic anemia: Secondary | ICD-10-CM | POA: Diagnosis present

## 2015-01-23 DIAGNOSIS — R578 Other shock: Secondary | ICD-10-CM | POA: Diagnosis present

## 2015-01-23 DIAGNOSIS — R000 Tachycardia, unspecified: Secondary | ICD-10-CM | POA: Diagnosis present

## 2015-01-23 DIAGNOSIS — J9690 Respiratory failure, unspecified, unspecified whether with hypoxia or hypercapnia: Secondary | ICD-10-CM | POA: Diagnosis not present

## 2015-01-23 DIAGNOSIS — Z5111 Encounter for antineoplastic chemotherapy: Secondary | ICD-10-CM | POA: Diagnosis not present

## 2015-01-23 DIAGNOSIS — I10 Essential (primary) hypertension: Secondary | ICD-10-CM | POA: Diagnosis present

## 2015-01-23 DIAGNOSIS — R58 Hemorrhage, not elsewhere classified: Secondary | ICD-10-CM

## 2015-01-23 DIAGNOSIS — J90 Pleural effusion, not elsewhere classified: Secondary | ICD-10-CM | POA: Diagnosis present

## 2015-01-23 DIAGNOSIS — C8313 Mantle cell lymphoma, intra-abdominal lymph nodes: Secondary | ICD-10-CM | POA: Diagnosis present

## 2015-01-23 DIAGNOSIS — J9602 Acute respiratory failure with hypercapnia: Secondary | ICD-10-CM | POA: Diagnosis not present

## 2015-01-23 DIAGNOSIS — K264 Chronic or unspecified duodenal ulcer with hemorrhage: Principal | ICD-10-CM | POA: Diagnosis present

## 2015-01-23 DIAGNOSIS — E785 Hyperlipidemia, unspecified: Secondary | ICD-10-CM | POA: Diagnosis present

## 2015-01-23 DIAGNOSIS — J9601 Acute respiratory failure with hypoxia: Secondary | ICD-10-CM | POA: Diagnosis present

## 2015-01-23 DIAGNOSIS — R571 Hypovolemic shock: Secondary | ICD-10-CM | POA: Diagnosis not present

## 2015-01-23 DIAGNOSIS — K254 Chronic or unspecified gastric ulcer with hemorrhage: Secondary | ICD-10-CM | POA: Diagnosis not present

## 2015-01-23 DIAGNOSIS — Z23 Encounter for immunization: Secondary | ICD-10-CM | POA: Diagnosis not present

## 2015-01-23 DIAGNOSIS — C8319 Mantle cell lymphoma, extranodal and solid organ sites: Secondary | ICD-10-CM

## 2015-01-23 DIAGNOSIS — Z79899 Other long term (current) drug therapy: Secondary | ICD-10-CM | POA: Diagnosis not present

## 2015-01-23 DIAGNOSIS — J9600 Acute respiratory failure, unspecified whether with hypoxia or hypercapnia: Secondary | ICD-10-CM | POA: Diagnosis present

## 2015-01-23 DIAGNOSIS — Z9289 Personal history of other medical treatment: Secondary | ICD-10-CM

## 2015-01-23 DIAGNOSIS — N179 Acute kidney failure, unspecified: Secondary | ICD-10-CM | POA: Diagnosis not present

## 2015-01-23 DIAGNOSIS — K922 Gastrointestinal hemorrhage, unspecified: Secondary | ICD-10-CM | POA: Diagnosis not present

## 2015-01-23 DIAGNOSIS — D649 Anemia, unspecified: Secondary | ICD-10-CM | POA: Diagnosis present

## 2015-01-23 LAB — URINALYSIS, ROUTINE W REFLEX MICROSCOPIC
BILIRUBIN URINE: NEGATIVE
Glucose, UA: NEGATIVE mg/dL
HGB URINE DIPSTICK: NEGATIVE
KETONES UR: NEGATIVE mg/dL
Leukocytes, UA: NEGATIVE
Nitrite: NEGATIVE
PROTEIN: NEGATIVE mg/dL
Specific Gravity, Urine: 1.014 (ref 1.005–1.030)
pH: 7.5 (ref 5.0–8.0)

## 2015-01-23 LAB — I-STAT CHEM 8, ED
BUN: 13 mg/dL (ref 6–20)
Calcium, Ion: 1.15 mmol/L (ref 1.13–1.30)
Chloride: 113 mmol/L — ABNORMAL HIGH (ref 101–111)
Creatinine, Ser: 1.5 mg/dL — ABNORMAL HIGH (ref 0.61–1.24)
Glucose, Bld: 328 mg/dL — ABNORMAL HIGH (ref 65–99)
HEMATOCRIT: 20 % — AB (ref 39.0–52.0)
HEMOGLOBIN: 6.8 g/dL — AB (ref 13.0–17.0)
Potassium: 5.5 mmol/L — ABNORMAL HIGH (ref 3.5–5.1)
SODIUM: 142 mmol/L (ref 135–145)
TCO2: 11 mmol/L (ref 0–100)

## 2015-01-23 LAB — CBC
HCT: 21.4 % — ABNORMAL LOW (ref 39.0–52.0)
HCT: 32.6 % — ABNORMAL LOW (ref 39.0–52.0)
HCT: 25.3 % — ABNORMAL LOW (ref 39.0–52.0)
HCT: 23.7 % — ABNORMAL LOW (ref 39.0–52.0)
HEMATOCRIT: 25.5 % — AB (ref 39.0–52.0)
HEMOGLOBIN: 10.3 g/dL — AB (ref 13.0–17.0)
HEMOGLOBIN: 8.5 g/dL — AB (ref 13.0–17.0)
Hemoglobin: 6.3 g/dL — CL (ref 13.0–17.0)
Hemoglobin: 8.6 g/dL — ABNORMAL LOW (ref 13.0–17.0)
Hemoglobin: 8 g/dL — ABNORMAL LOW (ref 13.0–17.0)
MCH: 27.6 pg (ref 26.0–34.0)
MCH: 28.6 pg (ref 26.0–34.0)
MCH: 28.7 pg (ref 26.0–34.0)
MCH: 28.7 pg (ref 26.0–34.0)
MCH: 28.4 pg (ref 26.0–34.0)
MCHC: 29.4 g/dL — ABNORMAL LOW (ref 30.0–36.0)
MCHC: 31.6 g/dL (ref 30.0–36.0)
MCHC: 33.6 g/dL (ref 30.0–36.0)
MCHC: 33.7 g/dL (ref 30.0–36.0)
MCHC: 33.8 g/dL (ref 30.0–36.0)
MCV: 93.9 fL (ref 78.0–100.0)
MCV: 90.6 fL (ref 78.0–100.0)
MCV: 85.5 fL (ref 78.0–100.0)
MCV: 85 fL (ref 78.0–100.0)
MCV: 84 fL (ref 78.0–100.0)
PLATELETS: 338 10*3/uL (ref 150–400)
PLATELETS: 316 10*3/uL (ref 150–400)
PLATELETS: 224 10*3/uL (ref 150–400)
PLATELETS: 216 10*3/uL (ref 150–400)
Platelets: 215 10*3/uL (ref 150–400)
RBC: 2.28 MIL/uL — AB (ref 4.22–5.81)
RBC: 3.6 MIL/uL — AB (ref 4.22–5.81)
RBC: 2.96 MIL/uL — AB (ref 4.22–5.81)
RBC: 3 MIL/uL — ABNORMAL LOW (ref 4.22–5.81)
RBC: 2.82 MIL/uL — ABNORMAL LOW (ref 4.22–5.81)
RDW: 16.6 % — ABNORMAL HIGH (ref 11.5–15.5)
RDW: 16.2 % — ABNORMAL HIGH (ref 11.5–15.5)
RDW: 16.1 % — ABNORMAL HIGH (ref 11.5–15.5)
RDW: 16.3 % — AB (ref 11.5–15.5)
RDW: 16.5 % — AB (ref 11.5–15.5)
WBC: 7.5 10*3/uL (ref 4.0–10.5)
WBC: 34.7 10*3/uL — AB (ref 4.0–10.5)
WBC: 16.6 10*3/uL — ABNORMAL HIGH (ref 4.0–10.5)
WBC: 19.2 10*3/uL — ABNORMAL HIGH (ref 4.0–10.5)
WBC: 17 10*3/uL — ABNORMAL HIGH (ref 4.0–10.5)

## 2015-01-23 LAB — COMPREHENSIVE METABOLIC PANEL
ALBUMIN: 2.3 g/dL — AB (ref 3.5–5.0)
ALT: 37 U/L (ref 17–63)
ANION GAP: 28 — AB (ref 5–15)
AST: 44 U/L — ABNORMAL HIGH (ref 15–41)
Alkaline Phosphatase: 67 U/L (ref 38–126)
BILIRUBIN TOTAL: 0.2 mg/dL — AB (ref 0.3–1.2)
BUN: 11 mg/dL (ref 6–20)
CO2: 8 mmol/L — ABNORMAL LOW (ref 22–32)
Calcium: 9.2 mg/dL (ref 8.9–10.3)
Chloride: 111 mmol/L (ref 101–111)
Creatinine, Ser: 1.71 mg/dL — ABNORMAL HIGH (ref 0.61–1.24)
GFR calc Af Amer: 48 mL/min — ABNORMAL LOW (ref >60)
GFR calc non Af Amer: 41 mL/min — ABNORMAL LOW (ref >60)
GLUCOSE: 359 mg/dL — AB (ref 65–99)
POTASSIUM: 5.7 mmol/L — AB (ref 3.5–5.1)
Sodium: 147 mmol/L — ABNORMAL HIGH (ref 135–145)
TOTAL PROTEIN: 4.5 g/dL — AB (ref 6.5–8.1)

## 2015-01-23 LAB — GLUCOSE, CAPILLARY
Glucose-Capillary: 155 mg/dL — ABNORMAL HIGH (ref 65–99)
Glucose-Capillary: 133 mg/dL — ABNORMAL HIGH (ref 65–99)
Glucose-Capillary: 135 mg/dL — ABNORMAL HIGH (ref 65–99)

## 2015-01-23 LAB — TRIGLYCERIDES: TRIGLYCERIDES: 107 mg/dL (ref <150)

## 2015-01-23 LAB — I-STAT ARTERIAL BLOOD GAS, ED
Acid-base deficit: 26 mmol/L — ABNORMAL HIGH (ref 0.0–2.0)
Bicarbonate: 8.2 mEq/L — ABNORMAL LOW (ref 20.0–24.0)
O2 SAT: 60 %
PH ART: 6.624 — AB (ref 7.350–7.450)
TCO2: 11 mmol/L (ref 0–100)
pCO2 arterial: 79.6 mmHg (ref 35.0–45.0)
pO2, Arterial: 70 mmHg — ABNORMAL LOW (ref 80.0–100.0)

## 2015-01-23 LAB — RAPID URINE DRUG SCREEN, HOSP PERFORMED
AMPHETAMINES: NOT DETECTED
BARBITURATES: NOT DETECTED
BENZODIAZEPINES: POSITIVE — AB
Cocaine: NOT DETECTED
Opiates: NOT DETECTED
Tetrahydrocannabinol: NOT DETECTED

## 2015-01-23 LAB — LACTIC ACID, PLASMA: LACTIC ACID, VENOUS: 2 mmol/L (ref 0.5–2.0)

## 2015-01-23 LAB — AFP TUMOR MARKER: AFP-Tumor Marker: 7.3 ng/mL (ref 0.0–8.3)

## 2015-01-23 LAB — PROTIME-INR
INR: 1.53 — ABNORMAL HIGH (ref 0.00–1.49)
PROTHROMBIN TIME: 18.4 s — AB (ref 11.6–15.2)

## 2015-01-23 LAB — PREPARE RBC (CROSSMATCH)

## 2015-01-23 LAB — BETA HCG QUANT (REF LAB): Beta hCG, Tumor Marker: <1 m[IU]/mL (ref 0–3)

## 2015-01-23 LAB — CBG MONITORING, ED: GLUCOSE-CAPILLARY: 173 mg/dL — AB (ref 65–99)

## 2015-01-23 LAB — CEA: CEA: 0.7 ng/mL (ref 0.0–4.7)

## 2015-01-23 LAB — AMMONIA: AMMONIA: 336 umol/L — AB (ref 9–35)

## 2015-01-23 IMAGING — CR DG CHEST 1V PORT
1 series · 1 of 1 positions shown · non-contrast
Comparison: Portable chest x-ray of earlier today at [DATE] a.m.

CLINICAL DATA: Assess orogastric tube positioning

EXAM:
PORTABLE CHEST 1 VIEW

[AP]
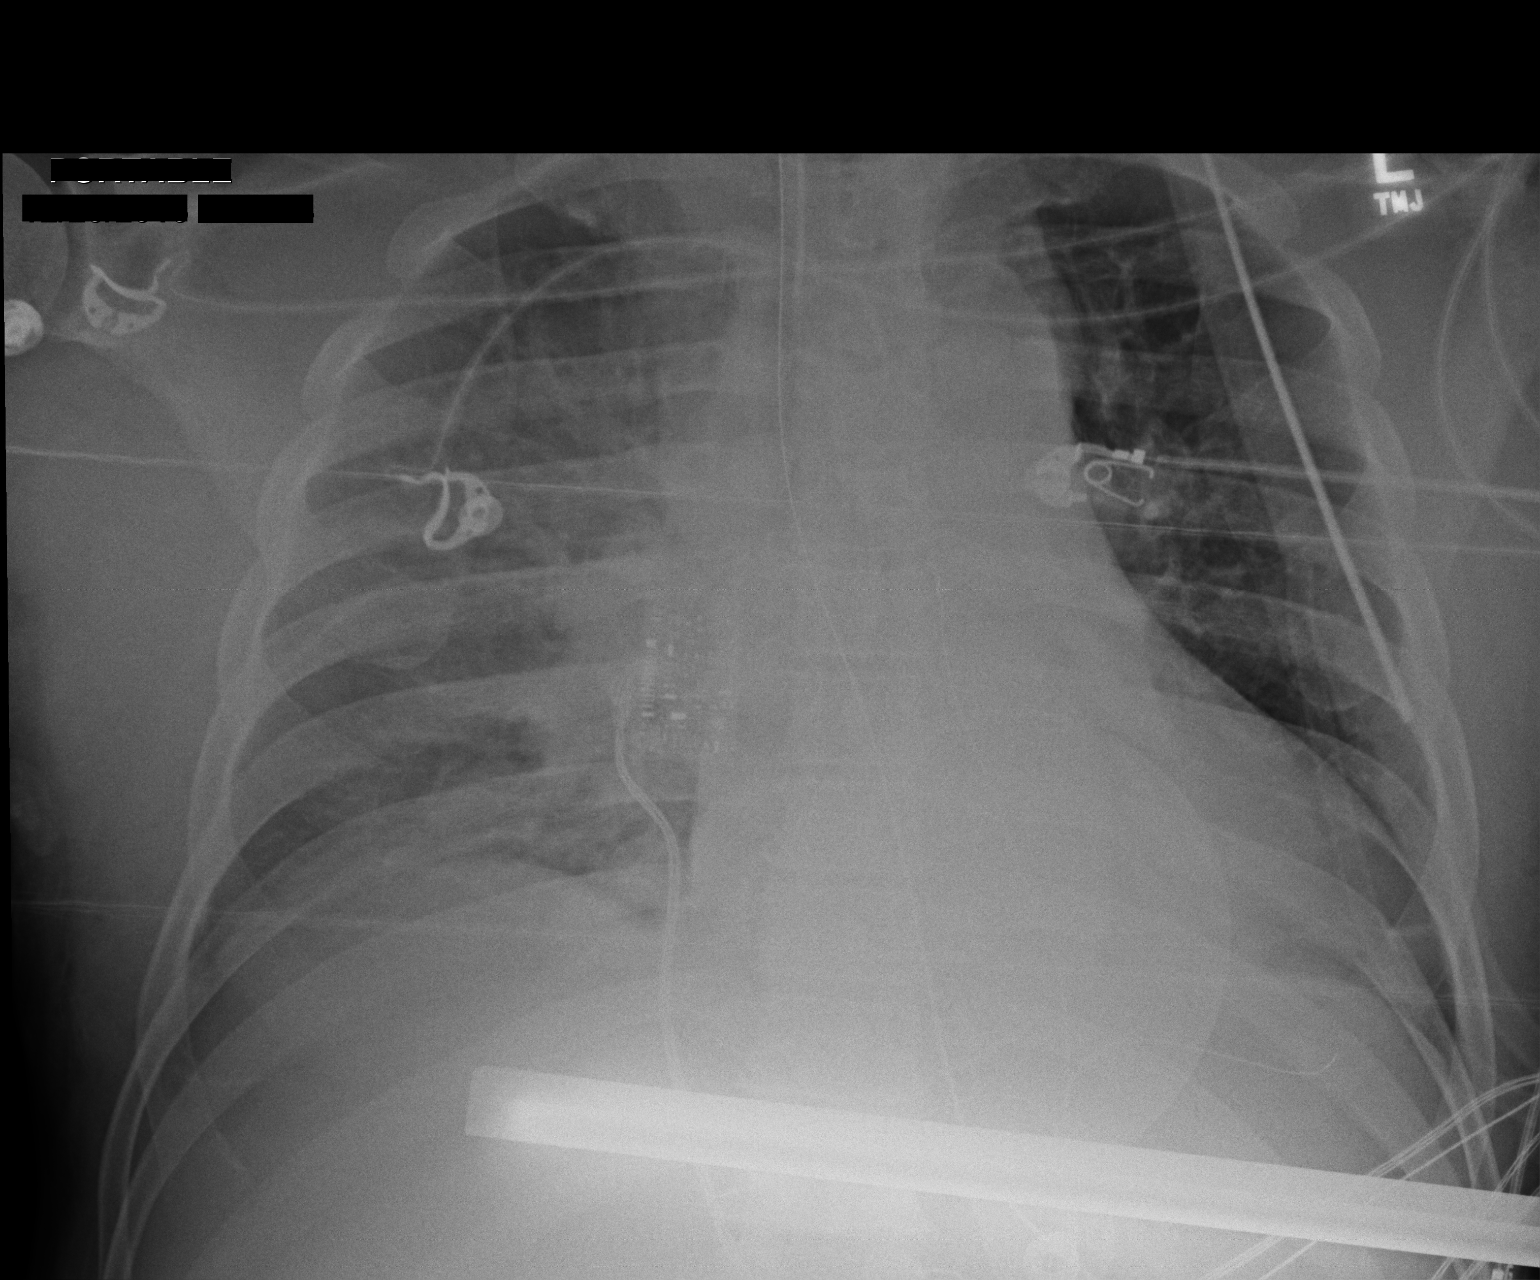

[1 of 1 positions shown; findings below may reference images not displayed]

FINDINGS: The esophagogastric tube tip projects below the inferior margin of
the image. There is no evidence of cord ongoing within the
esophagus. The endotracheal tube tip lies 4.1 cm above the carina.

The cardiac silhouette is mildly enlarged but stable. The central
pulmonary vascularity is engorged especially on the right. There is
mild to moderate pulmonary interstitial edema on the right. There is
no pleural effusion or pneumothorax. The observed bony thorax
exhibits no acute abnormality. External pacemaker -defibrillator
pads. Are present
IMPRESSION: 1. The esophagogastric tube tip projects below the inferior margin
of the image with no evidence of coiling within the esophagus. The
endotracheal tube is in appropriate position.
2. Persistently increased perihilar interstitial density especially
on the right consistent with pulmonary interstitial edema. There may
be pleural fluid on the right layering posteriorly contributing to
the findings here.

## 2015-01-23 IMAGING — XA IR ANGIO/EXISTING CATHETER
11 of 18 series · 11 of 24 positions shown · IV contrast (IODINE)
Comparison: none

CLINICAL DATA: 64-year-old male presents with acute
life-threatening upper GI bleed secondary to an ulcerated mass
encompassing the distal stomach and duodenum. The patient's
underlying etiology is favored to represent lymphoma which should is
currently undergoing confirmatory diagnosis. Endoscopic treatment
options are limited and surgery is extremely high risk. Patient
presents for emergent visceral arteriography and embolization.
Additionally, central venous access is required. A triple-lumen
central venous catheter will be placed during the course of the
procedure.
TECHNIQUE: Informed consent was obtained from the patient following explanation
of the procedure, risks, benefits and alternatives. The patient
understands, agrees and consents for the procedure. All questions
were addressed. A time out was performed.

[Series 1: body 4 care · 1 of 37 frames shown (1 of 10)]
[frame 32/37]
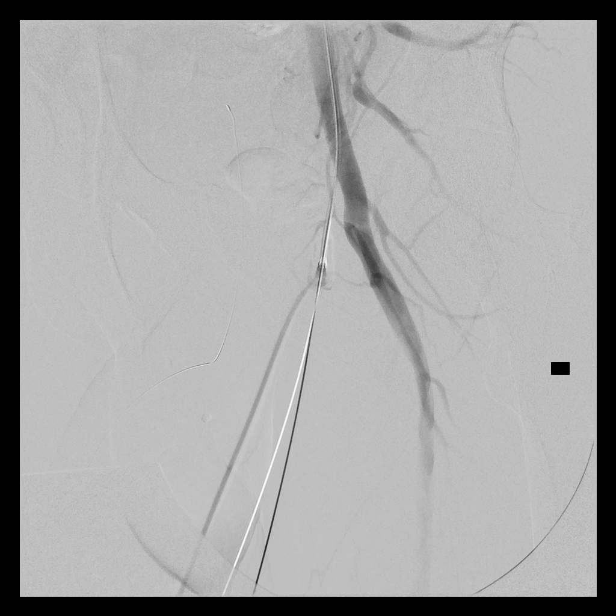

[Series 3: body 4 care · 1 of 19 frames shown (2 of 10)]
[frame 10/19]
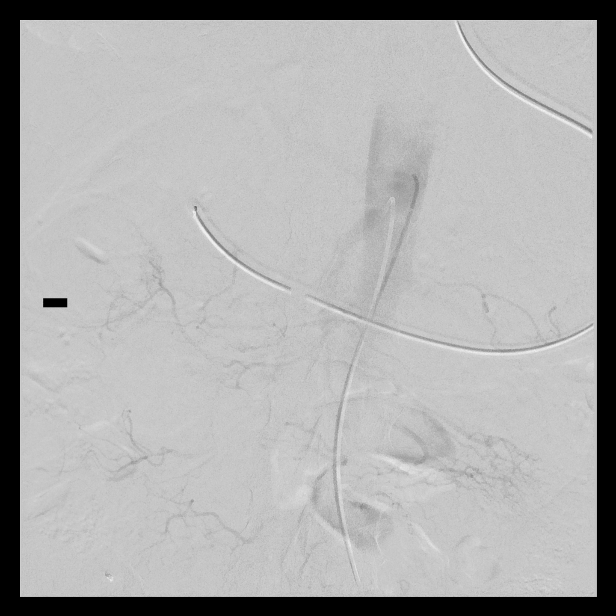

[Series 5: body 4 care · 1 of 22 frames shown (3 of 10)]
[frame 4/22]
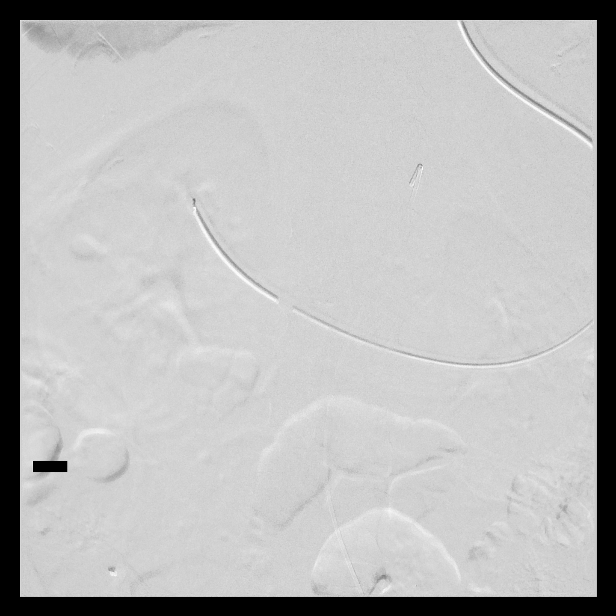

[Series 6: body 4 care · 1 of 24 frames shown (4 of 10)]
[frame 17/24]
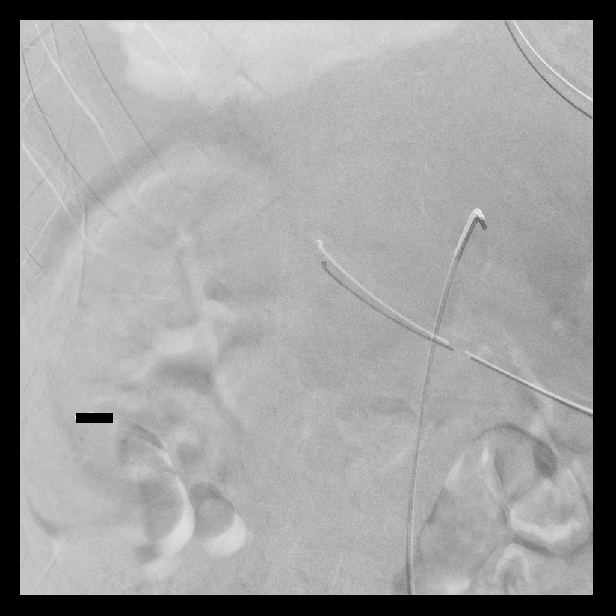

[Series 8: body 4 care · 1 of 49 frames shown (5 of 10)]
[frame 8/49]
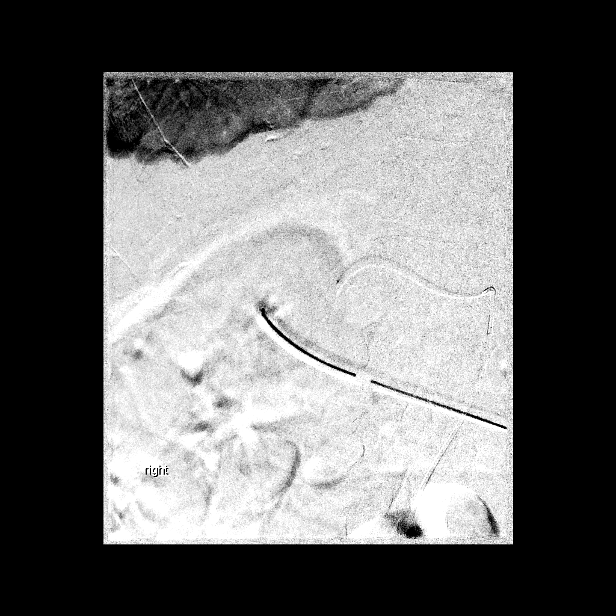

[Series 10: body 4 care · 1 of 13 frames shown (6 of 10)]
[frame 7/13]
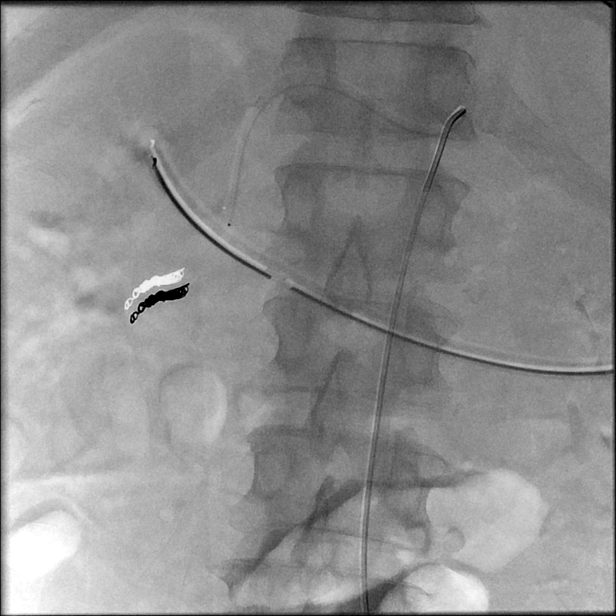

[Series 12: body 4 care · 1 of 23 frames shown (7 of 10)]
[frame 4/23]
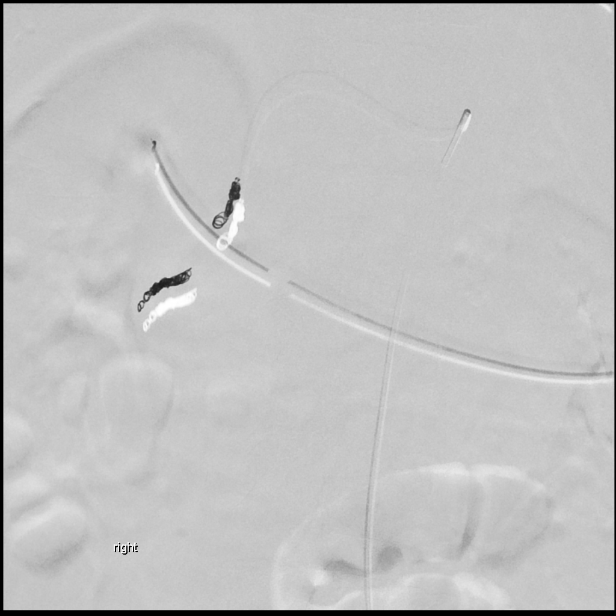

[Series 13: body 4 care · 1 of 22 frames shown (8 of 10)]
[frame 18/22]
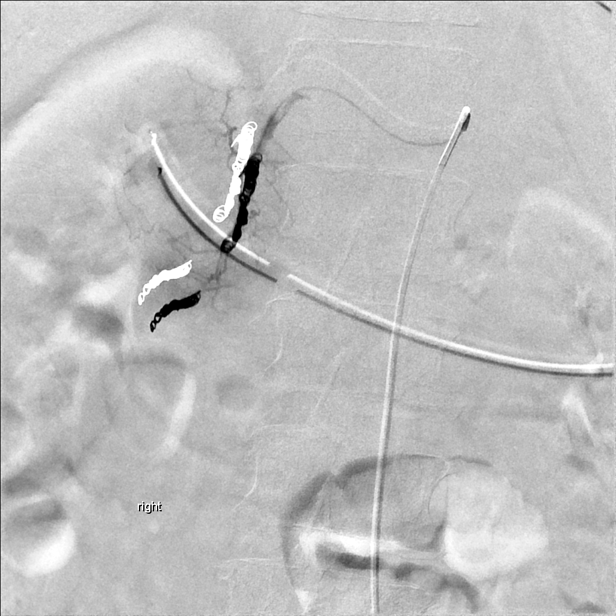

[Series 15: body 4 care · 1 of 28 frames shown (9 of 10)]
[frame 5/28]
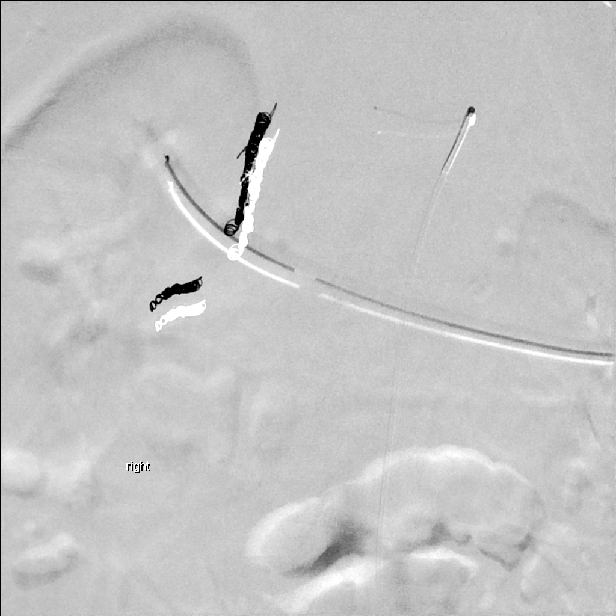

[Series 17: body 4 care · 1 of 42 frames shown (10 of 10)]
[frame 7/42]
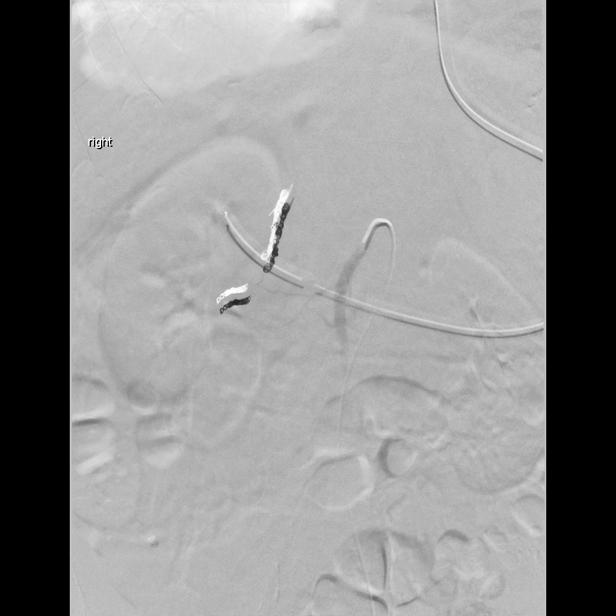

[Series 300: ir angiogram visceral selective · 1 of 140 slices shown]
[im 28/140]
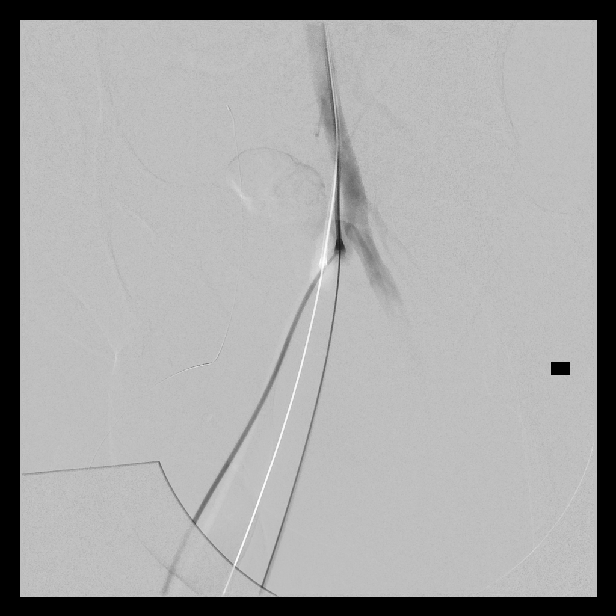

[11 of 24 positions shown; findings below may reference images not displayed]

EXAM:
SELECTIVE VISCERAL ARTERIOGRAPHY; IR LAN TIMOTEJ HEMORR LYMPH
EXTRAV INC GUIDE ROADMAPPING; IR LEFT FLOURO GUIDE CV LINE;
ADDITIONAL ARTERIOGRAPHY; IR ULTRASOUND GUIDANCE VASC ACCESS LEFT;
ARTERIOGRAPHY

Date: [DATE]

PROCEDURE:
1. Ultrasound-guided puncture left common femoral artery
2. Catheterization of the superior mesenteric artery with
arteriogram
3. Catheterization of the celiac artery with arteriogram
4. Catheterization of the gastroduodenal artery with arteriogram
5. Catheterization of the right gastroepiploic artery with
arteriogram
6. Coil embolization of the gastroduodenal artery
7. Post embolization arteriogram
8. Limited left common femoral arteriogram
9. Ultrasound-guided puncture of the left common femoral vein
10. Placement of a triple-lumen central venous catheter via the left
femoral vein with fluoroscopic guidance

ANESTHESIA/SEDATION:
Moderate (conscious) sedation was used. 1 mg Versed, 50 mcg Fentanyl
were administered intravenously. The patient's vital signs were
monitored continuously by radiology nursing throughout the
procedure.

Sedation Time: 60 minutes

MEDICATIONS:
None additional

FLUOROSCOPY TIME:  16 minutes for a total of 622 mGy

CONTRAST:  100mL OMNIPAQUE IOHEXOL 300 MG/ML  SOLN
Maximal barrier sterile technique utilized including caps, mask,
sterile gowns, sterile gloves, large sterile drape, hand hygiene,
and Betadine skin prep.

The left groin was interrogated with ultrasound. The common femoral
artery is found to be widely patent. An image was obtained and
stored for the medical record. Local anesthesia was attained by
infiltration with 1% lidocaine. Under real-time sonographic
guidance, the vessel was punctured with a 21 gauge micropuncture
needle. Using standard technique, the initial micro wire was
exchanged through a transitional 5 French micro sheath for a working
0.035 inch Bentson wire. The micro sheath was then exchanged for a
working 5 French vascular sheath. A C2 cobra catheter was advanced
into the abdominal aorta over the wire.

The catheter was initially advanced into the superior mesenteric
artery and superior mesenteric arteriogram was performed. There is
conventional anatomy. No replaced or accessory right hepatic artery.
No evidence of active extravasation or significant collateralization
through the pancreaticoduodenal arcade.

The C2 cobra catheter was next advanced into the celiac artery. An
arteriogram was performed. The gastroduodenal artery is elongated
and slightly irregular in the mid segment likely secondary to
external mass effect. There is no evidence of active extravasation.
A renegade ST micro catheter was advanced coaxially over a fat and
16 wire. The micro catheter was advanced into the gastroduodenal
artery. Arteriography was again performed. There are multiple
branches arising from the gastroduodenal artery providing supply to
a hypervascular mass in the region the descending duodenum. The
micro catheter was carefully advanced more distally into the
proximal right gastroepiploic artery. Contrast injection was
performed confirming the location of the catheter tip as well as
confirming no additional supplied to the region of tumor.

Coil embolization was then performed of the gastroduodenal artery
using a combination of vortex, standard helical and soft interlock
detachable micro coils ranging in size from 2-6 mm. Post
embolization arteriography confirms successful embolization of the
vessel with no further visible hypervascular blush of the duodenum
tumor.

The micro catheter was removed. Repeat celiac artery injection was
performed confirming no additional collateral supply. The 5 French
catheter was readvanced of the superior mesenteric artery and an
additional angiogram was performed again confirming no collateral
flow to the embolized territory.

The catheters were removed. The vascular sheath was left in place
for hemodynamic monitoring.

Ultrasound was again used to interrogate the left groin. The left
common femoral vein was identified. Local anesthesia was attained by
infiltration with 1% lidocaine. The left common femoral vein was
punctured with an 18 gauge needle. A wire was advanced into the
inferior vena cava. The skin tract was dilated and an arrow
triple-lumen central venous catheter advanced over the wire and
position with the tip at the confluence of the IVC and left common
iliac vein. The catheter was flushed, capped and secured with 0
Prolene suture. Sterile bandages were applied.

COMPLICATIONS:
None
IMPRESSION: 1. No evidence of active extravasation.
2. Positive hypervascular tumor blush in the region of the
descending duodenum with arterial supply from the gastroduodenal
artery.
3. Coil embolization of the gastroduodenal artery.
4. Placement of a left femoral triple-lumen central venous catheter.
The catheter tip is at the confluence of the IVC and iliac vein and
ready for immediate use.

## 2015-01-23 IMAGING — DX DG CHEST 1V PORT
1 series · 1 of 1 positions shown · non-contrast
Comparison: CT chest [DATE].  CT abdomen [DATE].

CLINICAL DATA: Status post intubation.  Intra abdominal mass.

EXAM:
PORTABLE CHEST 1 VIEW

[chest ap]
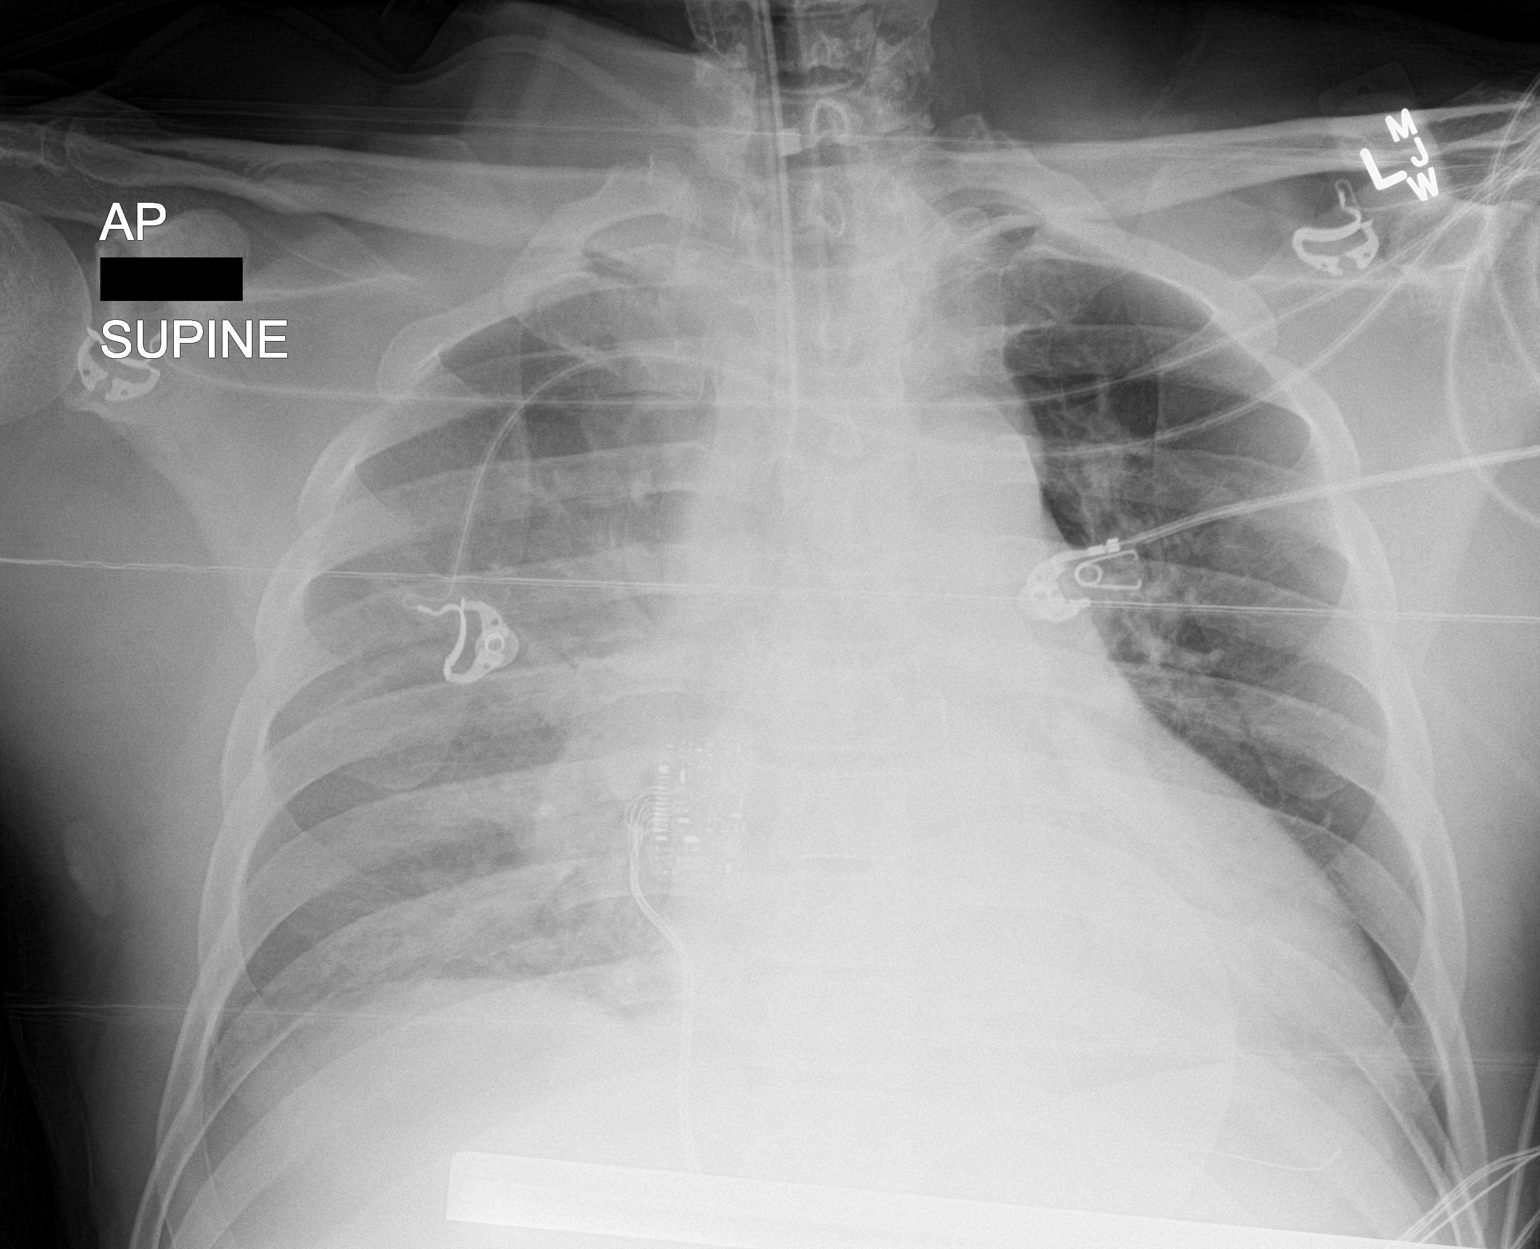

[1 of 1 positions shown; findings below may reference images not displayed]

FINDINGS: The patient has a new endotracheal tube in place with the tip 3.5 cm
above the carina. Hazy opacity over the right chest is consistent
with layering pleural effusion. Smaller left pleural effusion is
noted. No consolidative process or pneumothorax. Heart size is upper
normal.
IMPRESSION: ET tube in good position with the tip projecting 3.5 cm above the
carina.

Right greater than left pleural effusions.

## 2015-01-23 IMAGING — CT CT HEAD W/O CM
4 of 6 series · 18 of 47 positions shown, 20 images · non-contrast
Comparison: None.

CLINICAL DATA: 64-year-old male with fall.

EXAM:
CT HEAD WITHOUT CONTRAST
CT CERVICAL SPINE WITHOUT CONTRAST
TECHNIQUE: Multidetector CT imaging of the head and cervical spine was
performed following the standard protocol without intravenous
contrast. Multiplanar CT image reconstructions of the cervical spine
were also generated.

[Series 302: soft tissue, idose (2) · axial · 0.33mm/px · z∈[+520,+674]mm · 8 of 99 slices shown, 10 images]
[im 11/99  brain]
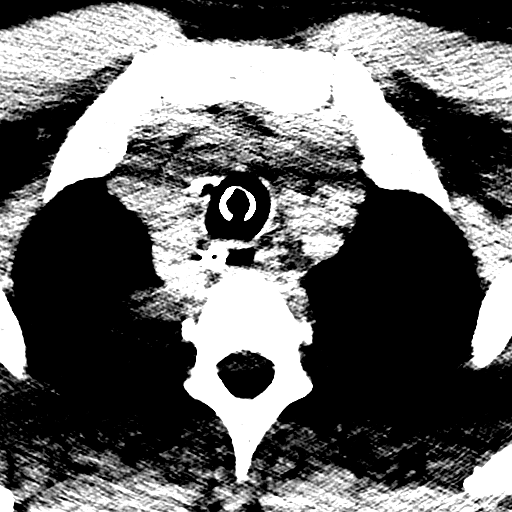
[im 11/99  bone]
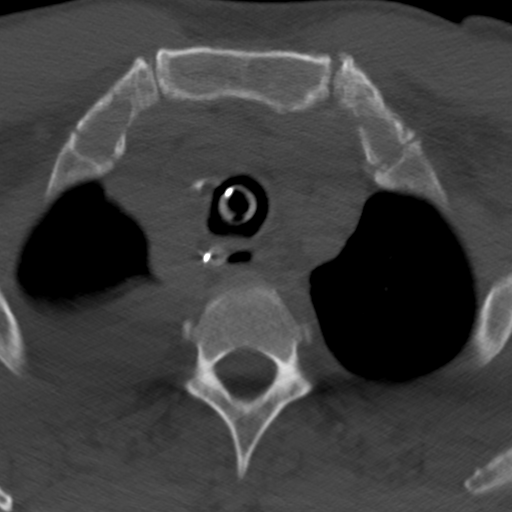
[im 22/99  brain]
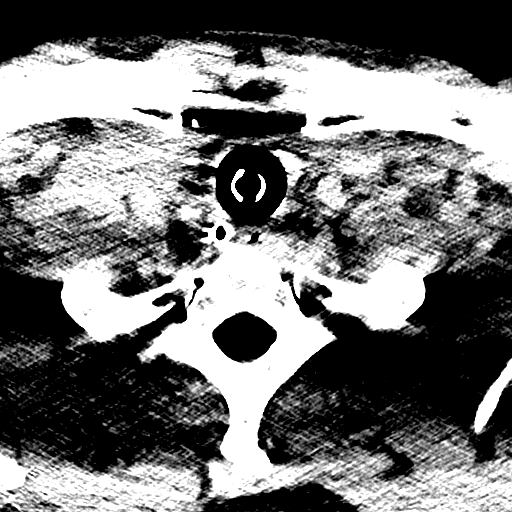
[im 33/99  brain]
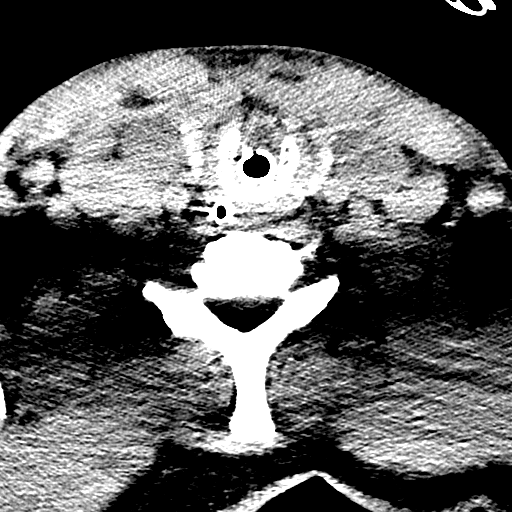
[im 44/99  brain]
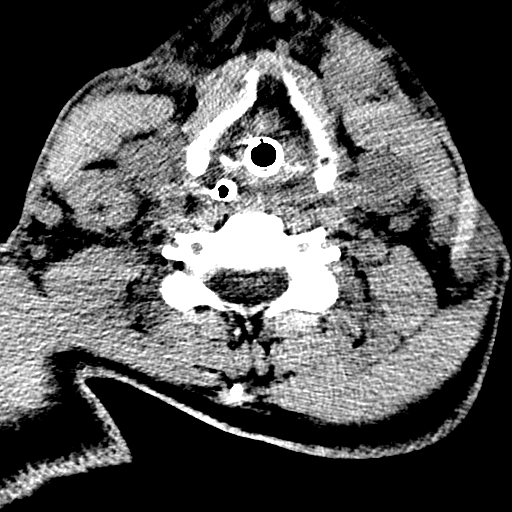
[im 55/99  brain]
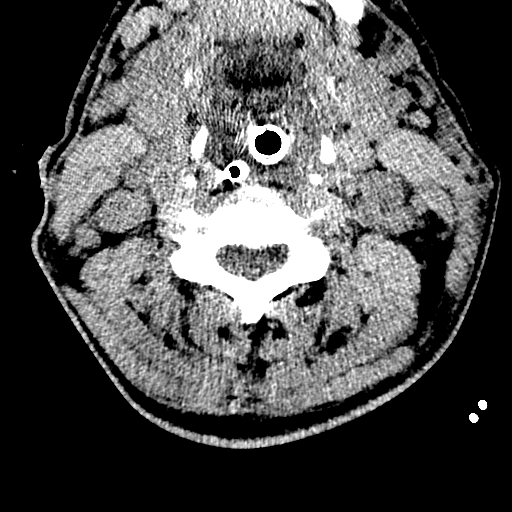
[im 55/99  bone]
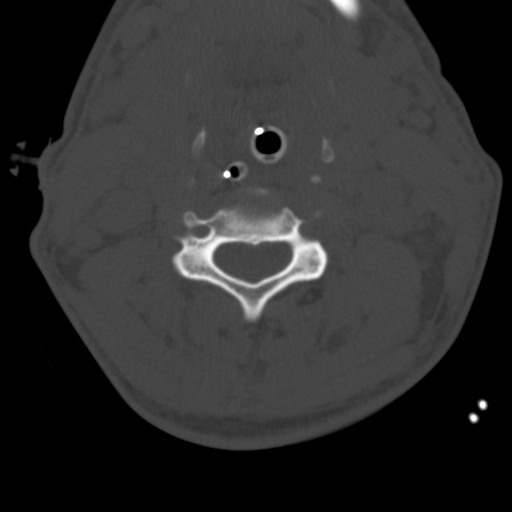
[im 66/99  brain]
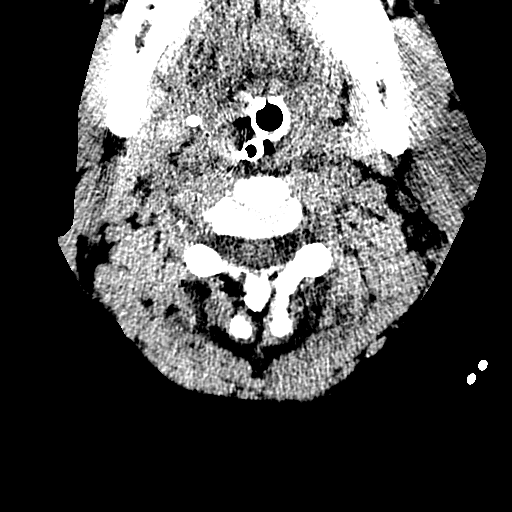
[im 77/99  brain]
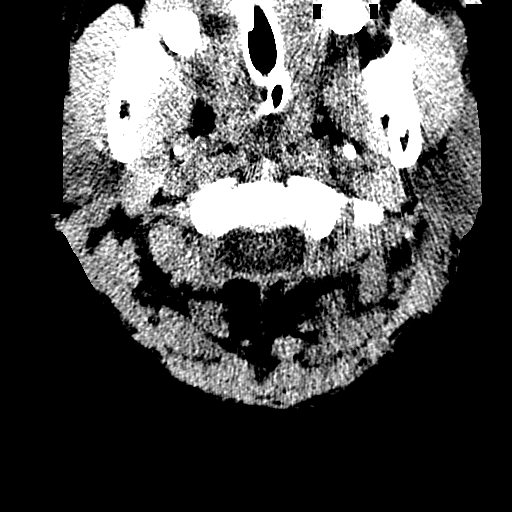
[im 88/99  brain]
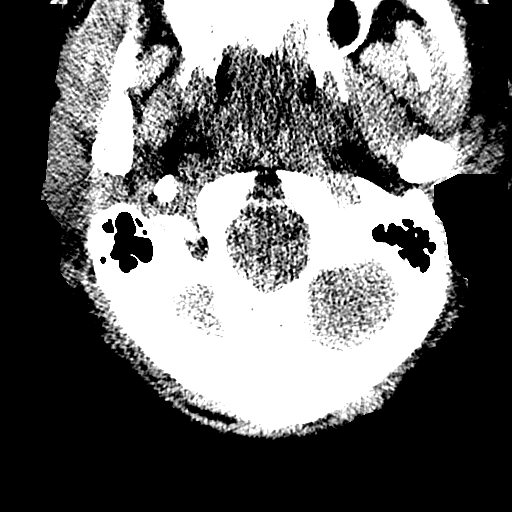

[Series 304: coronal, idose (2) · coronal · 0.34mm/px · 3 of 57 slices shown]
[im 19/57  brain]
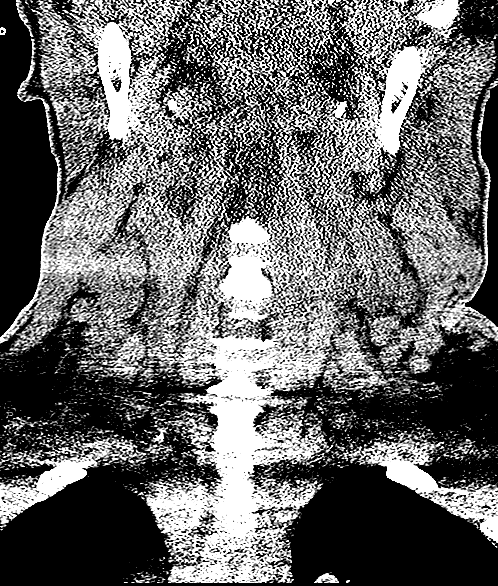
[im 25/57  brain]
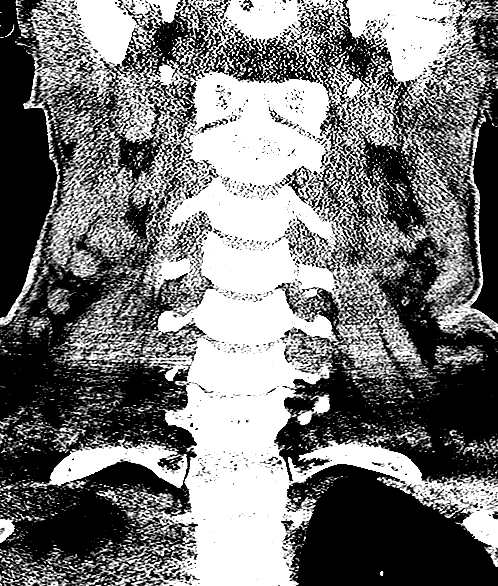
[im 32/57  brain]
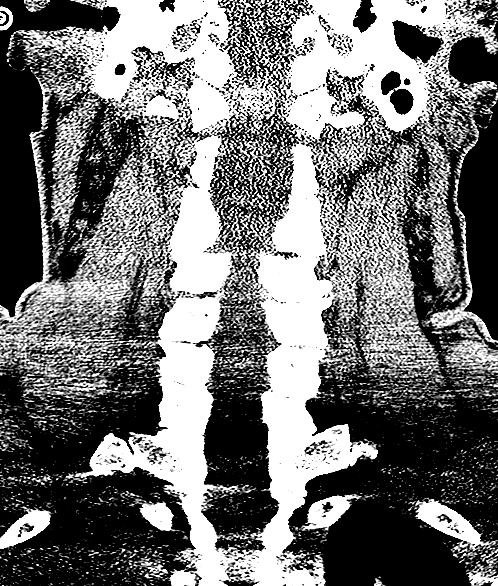

[Series 305: sagittal, idose (2) · sagittal · 0.33mm/px · 3 of 55 slices shown]
[im 19/55  brain]
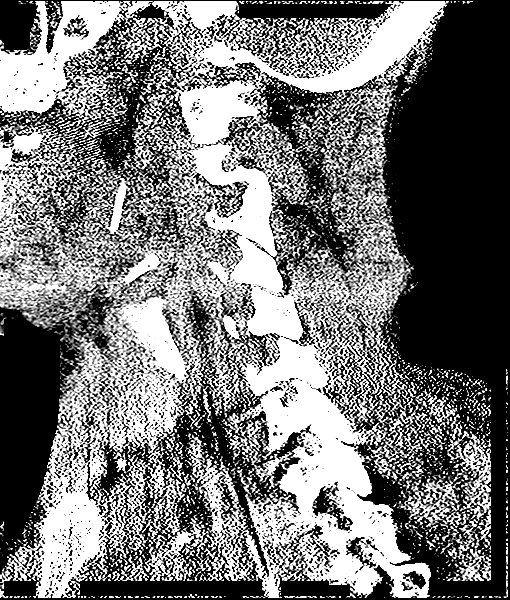
[im 28/55  brain]
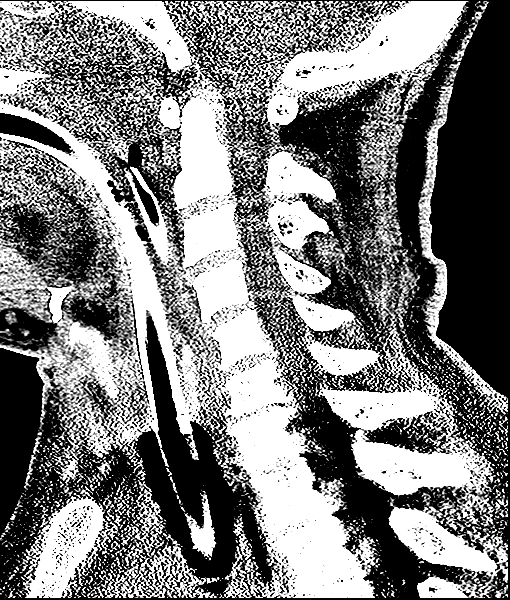
[im 37/55  brain]
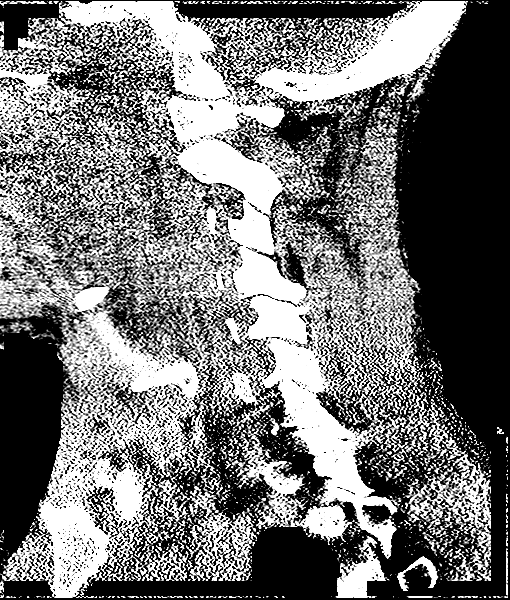

[Series 306: orthogonals, idose (2) · axial · 0.45mm/px · z∈[+498,+558]mm · 4 of 97 slices shown]
[im 11/97  brain]
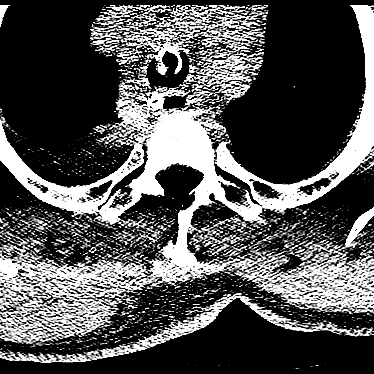
[im 22/97  brain]
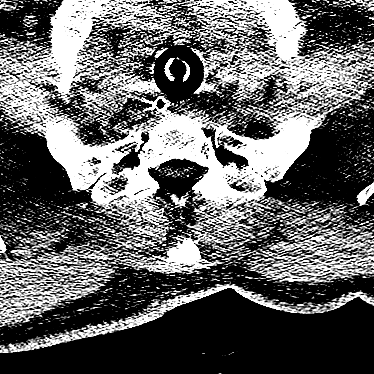
[im 33/97  brain]
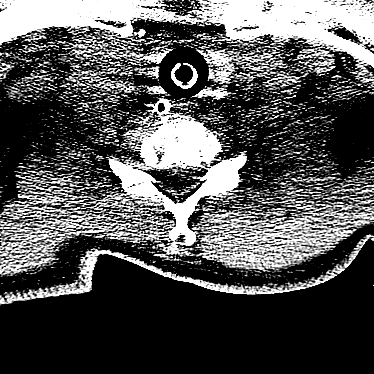
[im 43/97  brain]
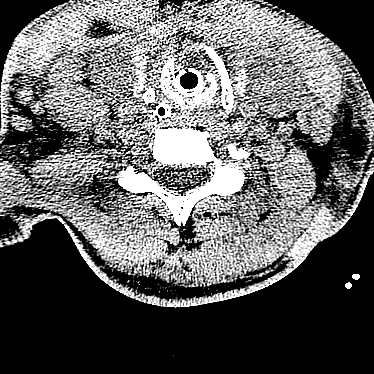

[18 of 47 positions shown; findings below may reference images not displayed]

FINDINGS: CT HEAD FINDINGS

The ventricles and sulci are appropriate in size for patient's age.
Mild periventricular and deep white matter hypodensities represent
chronic microvascular ischemic changes. There is no intracranial
hemorrhage. No mass effect or midline shift identified.

An endotracheal and enteric tube are partially visualized. There is
diffuse mucoperiosteal thickening of the paranasal sinuses with
partial opacification of the ethmoid air cells. The mastoid air
cells are well aerated. Right occipital scalp hematoma. The
calvarium is intact.

CT CERVICAL SPINE FINDINGS

There is no acute fracture or subluxation of the cervical spine.Mild
multilevel degenerative changes.The odontoid and spinous processes
are intact.There is normal anatomic alignment of the C1-C2 lateral
masses. The visualized soft tissues appear unremarkable.

An enteric tube and an endotracheal tube are partially visualized.
Partially visualized bilateral pleural effusions, right greater than
left. There is bilateral supraclavicular and right cervical
adenopathy. Upper mediastinal lymphadenopathy noted. Chest CT is
recommended for further evaluation. Right supraclavicular soft
tissue hemorrhage noted.
IMPRESSION: No acute intracranial hemorrhage.

Mild age-related atrophy and chronic microvascular ischemic disease.

No acute/traumatic cervical spine pathology.

Bilateral supraclavicular and right cervical as well as mediastinal
adenopathy. Further evaluation recommended.

## 2015-01-23 MED ORDER — NOREPINEPHRINE BITARTRATE 1 MG/ML IV SOLN
2.0000 ug/min | INTRAVENOUS | Status: DC
  Administered 2015-01-23: 6 ug/min via INTRAVENOUS
  Filled 2015-01-23: qty 4

## 2015-01-23 MED ORDER — PIPERACILLIN-TAZOBACTAM 3.375 G IVPB 30 MIN: 3.3750 g | Freq: Once | INTRAVENOUS | Status: DC

## 2015-01-23 MED ORDER — SUCCINYLCHOLINE CHLORIDE 20 MG/ML IJ SOLN
INTRAMUSCULAR | Status: AC | PRN
  Administered 2015-01-23: 100 mg via INTRAVENOUS

## 2015-01-23 MED ORDER — IOHEXOL 300 MG/ML  SOLN
250.0000 mL | Freq: Once | INTRAMUSCULAR | Status: AC | PRN
  Administered 2015-01-23: 100 mL via INTRAVENOUS

## 2015-01-23 MED ORDER — ONDANSETRON HCL 4 MG/2ML IJ SOLN: 4.0000 mg | Freq: Four times a day (QID) | INTRAMUSCULAR | Status: DC | PRN

## 2015-01-23 MED ORDER — SODIUM CHLORIDE 0.9 % IV SOLN
50.0000 ug/h | INTRAVENOUS | Status: DC
  Administered 2015-01-23 – 2015-01-25 (×5): 50 ug/h via INTRAVENOUS
  Filled 2015-01-23 (×10): qty 1

## 2015-01-23 MED ORDER — SODIUM CHLORIDE 0.9 % IV BOLUS (SEPSIS)
1000.0000 mL | Freq: Once | INTRAVENOUS | Status: AC
  Administered 2015-01-23: 1000 mL via INTRAVENOUS

## 2015-01-23 MED ORDER — ROCURONIUM BROMIDE 50 MG/5ML IV SOLN
INTRAVENOUS | Status: AC | PRN
  Administered 2015-01-23: 100 mg via INTRAVENOUS

## 2015-01-23 MED ORDER — ETOMIDATE 2 MG/ML IV SOLN
INTRAVENOUS | Status: AC | PRN
  Administered 2015-01-23: 20 mg via INTRAVENOUS

## 2015-01-23 MED ORDER — SODIUM CHLORIDE 0.9 % IV SOLN: 250.0000 mL | INTRAVENOUS | Status: DC | PRN

## 2015-01-23 MED ORDER — PROPOFOL 1000 MG/100ML IV EMUL
5.0000 ug/kg/min | Freq: Once | INTRAVENOUS | Status: AC
  Administered 2015-01-23: 5 ug/kg/min via INTRAVENOUS
  Filled 2015-01-23: qty 100

## 2015-01-23 MED ORDER — SODIUM CHLORIDE 0.9 % IV SOLN
80.0000 mg | Freq: Once | INTRAVENOUS | Status: AC
  Administered 2015-01-23: 80 mg via INTRAVENOUS
  Filled 2015-01-23: qty 80

## 2015-01-23 MED ORDER — FENTANYL CITRATE (PF) 100 MCG/2ML IJ SOLN
INTRAMUSCULAR | Status: AC | PRN
  Administered 2015-01-23: 50 ug via INTRAVENOUS

## 2015-01-23 MED ORDER — LIDOCAINE HCL 1 % IJ SOLN
INTRAMUSCULAR | Status: AC
  Filled 2015-01-23: qty 20

## 2015-01-23 MED ORDER — CHLORHEXIDINE GLUCONATE 0.12% ORAL RINSE (MEDLINE KIT)
15.0000 mL | Freq: Two times a day (BID) | OROMUCOSAL | Status: DC
  Administered 2015-01-24: 15 mL via OROMUCOSAL

## 2015-01-23 MED ORDER — FENTANYL CITRATE (PF) 100 MCG/2ML IJ SOLN
100.0000 ug | INTRAMUSCULAR | Status: DC | PRN
  Administered 2015-01-23: 100 ug via INTRAVENOUS

## 2015-01-23 MED ORDER — FENTANYL CITRATE (PF) 100 MCG/2ML IJ SOLN: 100.0000 ug | INTRAMUSCULAR | Status: DC | PRN

## 2015-01-23 MED ORDER — FENTANYL BOLUS VIA INFUSION
50.0000 ug | INTRAVENOUS | Status: DC | PRN
  Filled 2015-01-23: qty 50

## 2015-01-23 MED ORDER — MIDAZOLAM HCL 2 MG/2ML IJ SOLN
INTRAMUSCULAR | Status: AC | PRN
  Administered 2015-01-23: 1 mg via INTRAVENOUS

## 2015-01-23 MED ORDER — ANTISEPTIC ORAL RINSE SOLUTION (CORINZ)
7.0000 mL | Freq: Four times a day (QID) | OROMUCOSAL | Status: DC
  Administered 2015-01-23 – 2015-01-24 (×6): 7 mL via OROMUCOSAL

## 2015-01-23 MED ORDER — OCTREOTIDE LOAD VIA INFUSION
50.0000 ug | Freq: Once | INTRAVENOUS | Status: AC
  Administered 2015-01-23: 50 ug via INTRAVENOUS
  Filled 2015-01-23: qty 25

## 2015-01-23 MED ORDER — BISACODYL 10 MG RE SUPP: 10.0000 mg | Freq: Every day | RECTAL | Status: DC | PRN

## 2015-01-23 MED ORDER — MIDAZOLAM HCL 2 MG/2ML IJ SOLN
INTRAMUSCULAR | Status: AC
  Filled 2015-01-23: qty 2

## 2015-01-23 MED ORDER — FENTANYL CITRATE (PF) 100 MCG/2ML IJ SOLN
50.0000 ug | Freq: Once | INTRAMUSCULAR | Status: AC
  Administered 2015-01-23: 50 ug via INTRAVENOUS
  Filled 2015-01-23: qty 2

## 2015-01-23 MED ORDER — FENTANYL CITRATE (PF) 100 MCG/2ML IJ SOLN
INTRAMUSCULAR | Status: AC
  Filled 2015-01-23: qty 2

## 2015-01-23 MED ORDER — SODIUM CHLORIDE 0.9 % IV SOLN: Freq: Once | INTRAVENOUS | Status: DC

## 2015-01-23 MED ORDER — PANTOPRAZOLE SODIUM 40 MG IV SOLR
8.0000 mg/h | INTRAVENOUS | Status: DC
  Administered 2015-01-23 – 2015-01-26 (×7): 8 mg/h via INTRAVENOUS
  Filled 2015-01-23 (×14): qty 80

## 2015-01-23 MED ORDER — PIPERACILLIN-TAZOBACTAM 3.375 G IVPB
3.3750 g | Freq: Three times a day (TID) | INTRAVENOUS | Status: DC
  Administered 2015-01-23 – 2015-01-26 (×10): 3.375 g via INTRAVENOUS
  Filled 2015-01-23 (×13): qty 50

## 2015-01-23 MED ORDER — CHLORHEXIDINE GLUCONATE 0.12% ORAL RINSE (MEDLINE KIT)
15.0000 mL | Freq: Two times a day (BID) | OROMUCOSAL | Status: DC
  Administered 2015-01-23: 15 mL via OROMUCOSAL

## 2015-01-23 MED ORDER — NOREPINEPHRINE BITARTRATE 1 MG/ML IV SOLN
0.0000 ug/min | INTRAVENOUS | Status: DC
  Administered 2015-01-23: 6 ug/min via INTRAVENOUS
  Filled 2015-01-23: qty 4

## 2015-01-23 MED ORDER — ANTISEPTIC ORAL RINSE SOLUTION (CORINZ): 7.0000 mL | Freq: Four times a day (QID) | OROMUCOSAL | Status: DC

## 2015-01-23 MED ORDER — PROPOFOL 1000 MG/100ML IV EMUL
0.0000 ug/kg/min | INTRAVENOUS | Status: DC
  Administered 2015-01-23 – 2015-01-24 (×3): 30 ug/kg/min via INTRAVENOUS
  Filled 2015-01-23 (×3): qty 100

## 2015-01-23 MED ORDER — SODIUM CHLORIDE 0.9 % IV SOLN
25.0000 ug/h | INTRAVENOUS | Status: DC
  Administered 2015-01-23: 50 ug/h via INTRAVENOUS
  Filled 2015-01-23: qty 50

## 2015-01-23 MED ORDER — PANTOPRAZOLE SODIUM 40 MG IV SOLR
40.0000 mg | INTRAVENOUS | Status: AC
  Administered 2015-01-23: 40 mg via INTRAVENOUS
  Filled 2015-01-23: qty 40

## 2015-01-23 MED ORDER — FAMOTIDINE IN NACL 20-0.9 MG/50ML-% IV SOLN
20.0000 mg | Freq: Once | INTRAVENOUS | Status: AC
  Administered 2015-01-23: 20 mg via INTRAVENOUS
  Filled 2015-01-23: qty 50

## 2015-01-23 NOTE — Progress Notes (Signed)
Pt transported from 2S02 to CT on vent and back without incidence. RT will continue to monitor.

## 2015-01-23 NOTE — Sedation Documentation (Signed)
Family updated as to patient's status.

## 2015-01-23 NOTE — Sedation Documentation (Signed)
Vital signs stable. 

## 2015-01-23 NOTE — Consult Note (Signed)
[facility] CON[facility] Reason for consult: G.I. hemorrhage Referring Physician: Emergency Room. Primary G.I.: [name] is an 64 y.o. male.  HPI: he was admitted to the hospital and just discharged home yesterday. He was admitted with syncope and melena in the hemoglobin of 4.5 and did receive for units of blood. He had EGD by Dr. Michail Sermon 4 days ago that showed ulcerated duodenal mass that basically fill the entire duodenum with apparent clot. Dr. Joette Catching feeling at the time of endoscopy was that he would need surgery or embolization by IR if he really believed it. He was treated with PPI therapy. He was also found to have large lymph node which was biopsied yesterday. Path is still pending. CT scans were obtained of the abdomen and chest and revealed a large ulcerated mass involving list, and duodenum extending into the porta hepatis with marked increase lymphadenopathy highly suggestive of gastrointestinal lymphoma. CT at the chest revealed extensive lymphadenopathy also felt to be consistent with diffuse lymphoma. He was seen by oncology. When he was due to follow-up with them. 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 The plan was to consider initiating treatment after the path on the lymph node biopsy had returned. According to the patient's wife, he ate a meal with no problems prior to going home and went home yesterday evening. He ate some more last night and then early this morning he apparently had a syncopal episode. According to his wife he did not hit his head but passed out and then vomited up large quantities of bright blood. He was brought to the emergency room by EMS and was in severe respiratory distress requiring intubation and sedation. Discuss with the ER physician and recommended placement of NG tube purchase continued to drain bright red blood. He has been seen by surgery and will be  seen by IR for possible embolization to control the bleeding. His wife tells me that he had no abdominal pain prior to vomiting up this quantity of blood. Pertinent labs are remarkable for pH 6.6 PO2 70 PCO2 80. Potassium 5.5 hemoglobin 6.3. Chest x-ray showed effusions and some densities. He is still currently intubated.  Past Medical History  Diagnosis Date  . Essential hypertension   . HLD (hyperlipidemia)     Past Surgical History  Procedure Laterality Date  . Skin surgery      Small benign cysts over left scalp removed  . Esophagogastroduodenoscopy Left 01/18/2015    Procedure: ESOPHAGOGASTRODUODENOSCOPY (EGD);  Surgeon: Wilford Corner, MD;  Location: [facility] ENDOSCOPY;  Service: Endoscopy;  Laterality: Left;    Family History  Problem Relation Age of Onset  . Hypertension Mother   . Hypertension Father   . Hypertension Sister   . Diabetes Sister   . Prostate cancer Brother   . Lupus Sister   . Kidney failure Father     Social History:  reports that he has never smoked. He does not have any smokeless tobacco history on file. He reports that he does not drink alcohol or use illicit drugs.  Allergies: No Known Allergies  Medications; Prior to Admission medications   Medication Sig Start Date End Date Taking? Authorizing Provider  omeprazole (PRILOSEC) 40 MG capsule Take 1 capsule (40 mg total) by mouth 2 (two) times daily. 01/22/15   Bonnielee Haff, MD   . antiseptic oral rinse  7 mL Mouth Rinse QID  . chlorhexidine gluconate  15 mL Mouth  Rinse BID  . lidocaine      . octreotide  50 mcg Intravenous Once   PRN Meds sodium chloride, bisacodyl, etomidate, fentaNYL (SUBLIMAZE) injection, fentaNYL (SUBLIMAZE) injection, ondansetron (ZOFRAN) IV, rocuronium, succinylcholine Results for orders placed or performed during the hospital encounter of 01/23/15 (from the past 48 hour(s))  CBC     Status: Abnormal   Collection Time: 01/23/15  7:51 AM  Result Value Ref Range   WBC 7.5  4.0 - 10.5 K/uL   RBC 2.28 (Taylor) 4.22 - 5.81 MIL/uL   Hemoglobin 6.3 (LL) 13.0 - 17.0 g/dL    Comment: REPEATED TO VERIFY CRITICAL RESULT CALLED TO, READ BACK BY AND VERIFIED WITH: W.CHILDRESS,RN 01/23/15 0822 CLARK,S    HCT 21.4 (Taylor) 39.0 - 52.0 %   MCV 93.9 78.0 - 100.0 fL    Comment: DELTA CHECK NOTED RESULT CALLED TO, READ BACK BY AND VERIFIED WITH: W.CHILDRESS,RN 01/23/15 0822 CLARK,S    MCH 27.6 26.0 - 34.0 pg   MCHC 29.4 (Taylor) 30.0 - 36.0 g/dL   RDW 22.6 (H) 33.3 - 54.5 %   Platelets 338 150 - 400 K/uL  Protime-INR - (order if Patient is taking Coumadin / Warfarin)     Status: Abnormal   Collection Time: 01/23/15  7:51 AM  Result Value Ref Range   Prothrombin Time 18.4 (H) 11.6 - 15.2 seconds   INR 1.53 (H) 0.00 - 1.49  Comprehensive metabolic panel     Status: Abnormal   Collection Time: 01/23/15  7:51 AM  Result Value Ref Range   Sodium 147 (H) 135 - 145 mmol/Taylor    Comment: DELTA CHECK NOTED   Potassium 5.7 (H) 3.5 - 5.1 mmol/Taylor    Comment: SLIGHT HEMOLYSIS   Chloride 111 101 - 111 mmol/Taylor   CO2 8 (Taylor) 22 - 32 mmol/Taylor   Glucose, Bld 359 (H) 65 - 99 mg/dL   BUN 11 6 - 20 mg/dL   Creatinine, Ser 6.25 (H) 0.61 - 1.24 mg/dL   Calcium 9.2 8.9 - 63.8 mg/dL   Total Protein 4.5 (Taylor) 6.5 - 8.1 g/dL   Albumin 2.3 (Taylor) 3.5 - 5.0 g/dL   AST 44 (H) 15 - 41 U/Taylor   ALT 37 17 - 63 U/Taylor   Alkaline Phosphatase 67 38 - 126 U/Taylor   Total Bilirubin 0.2 (Taylor) 0.3 - 1.2 mg/dL   GFR calc non Af Amer 41 (Taylor) >60 mL/min   GFR calc Af Amer 48 (Taylor) >60 mL/min    Comment: (NOTE) The eGFR has been calculated using the CKD EPI equation. This calculation has not been validated in all clinical situations. eGFR's persistently <60 mL/min signify possible Chronic Kidney Disease.    Anion gap 28 (H) 5 - 15  Ammonia     Status: Abnormal   Collection Time: 01/23/15  8:00 AM  Result Value Ref Range   Ammonia 336 (H) 9 - 35 umol/Taylor  Type and screen     Status: None (Preliminary result)   Collection Time: 01/23/15   8:00 AM  Result Value Ref Range   ABO/RH(D) AB POS    Antibody Screen NEG    Sample Expiration 01/26/2015    Unit Number [id number]    Blood Component Type RED CELLS,LR    Unit division 00    Status of Unit ISSUED    Unit tag comment VERBAL ORDERS PER DR MILLER    Transfusion Status OK TO TRANSFUSE    Crossmatch Result COMPATIBLE    Unit Number  [id number]    Blood Component Type RED CELLS,LR    Unit division 00    Status of Unit ISSUED    Unit tag comment VERBAL ORDERS PER DR MILLER    Transfusion Status OK TO TRANSFUSE    Crossmatch Result COMPATIBLE    Unit Number [id number]    Blood Component Type RED CELLS,LR    Unit division 00    Status of Unit ISSUED    Unit tag comment VERBAL ORDERS PER DR MILLER    Transfusion Status OK TO TRANSFUSE    Crossmatch Result COMPATIBLE    Unit Number [id number]    Blood Component Type RED CELLS,LR    Unit division 00    Status of Unit ISSUED    Unit tag comment VERBAL ORDERS PER DR MILLER    Transfusion Status OK TO TRANSFUSE    Crossmatch Result COMPATIBLE    Unit Number [id number]    Blood Component Type RED CELLS,LR    Unit division 00    Status of Unit ALLOCATED    Transfusion Status OK TO TRANSFUSE    Crossmatch Result Compatible   Prepare RBC     Status: None   Collection Time: 01/23/15  8:00 AM  Result Value Ref Range   Order Confirmation ORDER PROCESSED BY BLOOD BANK   Urine rapid drug screen (hosp performed)     Status: Abnormal   Collection Time: 01/23/15  8:05 AM  Result Value Ref Range   Opiates NONE DETECTED NONE DETECTED   Cocaine NONE DETECTED NONE DETECTED   Benzodiazepines POSITIVE (A) NONE DETECTED   Amphetamines NONE DETECTED NONE DETECTED   Tetrahydrocannabinol NONE DETECTED NONE DETECTED   Barbiturates NONE DETECTED NONE DETECTED    Comment:        DRUG SCREEN FOR MEDICAL PURPOSES ONLY.  IF CONFIRMATION IS NEEDED FOR ANY PURPOSE, NOTIFY LAB WITHIN 5 DAYS.        LOWEST DETECTABLE  LIMITS FOR URINE DRUG SCREEN Drug Class       Cutoff (ng/mL) Amphetamine      1000 Barbiturate      200 Benzodiazepine   448 Tricyclics       185 Opiates          300 Cocaine          300 THC              50   Urinalysis, Routine w reflex microscopic (not at [facility])     Status: None   Collection Time: 01/23/15  8:05 AM  Result Value Ref Range   Color, Urine YELLOW YELLOW   APPearance CLEAR CLEAR   Specific Gravity, Urine 1.014 1.005 - 1.030   pH 7.5 5.0 - 8.0   Glucose, UA NEGATIVE NEGATIVE mg/dL   Hgb urine dipstick NEGATIVE NEGATIVE   Bilirubin Urine NEGATIVE NEGATIVE   Ketones, ur NEGATIVE NEGATIVE mg/dL   Protein, ur NEGATIVE NEGATIVE mg/dL   Nitrite NEGATIVE NEGATIVE   Leukocytes, UA NEGATIVE NEGATIVE    Comment: MICROSCOPIC NOT DONE ON URINES WITH NEGATIVE PROTEIN, BLOOD, LEUKOCYTES, NITRITE, OR GLUCOSE <1000 mg/dL.  I-Stat Chem 8, ED     Status: Abnormal   Collection Time: 01/23/15  8:12 AM  Result Value Ref Range   Sodium 142 135 - 145 mmol/Taylor   Potassium 5.5 (H) 3.5 - 5.1 mmol/Taylor   Chloride 113 (H) 101 - 111 mmol/Taylor   BUN 13 6 - 20 mg/dL   Creatinine, Ser 1.50 (H) 0.61 - 1.24 mg/dL  Glucose, Bld 328 (H) 65 - 99 mg/dL   Calcium, Ion 1.15 1.13 - 1.30 mmol/Taylor   TCO2 11 0 - 100 mmol/Taylor   Hemoglobin 6.8 (LL) 13.0 - 17.0 g/dL   HCT 20.0 (Taylor) 39.0 - 52.0 %   Comment NOTIFIED PHYSICIAN   I-Stat arterial blood gas, ED     Status: Abnormal   Collection Time: 01/23/15  8:32 AM  Result Value Ref Range   pH, Arterial 6.624 (LL) 7.350 - 7.450   pCO2 arterial 79.6 (HH) 35.0 - 45.0 mmHg   pO2, Arterial 70.0 (Taylor) 80.0 - 100.0 mmHg   Bicarbonate 8.2 (Taylor) 20.0 - 24.0 mEq/Taylor   TCO2 11 0 - 100 mmol/Taylor   O2 Saturation 60.0 %   Acid-base deficit 26.0 (H) 0.0 - 2.0 mmol/Taylor   Patient temperature 37.0 C    Collection site RADIAL, ALLEN'S TEST ACCEPTABLE    Drawn by RT    Sample type ARTERIAL    Comment NOTIFIED PHYSICIAN   Prepare fresh frozen plasma     Status: None (Preliminary result)    Collection Time: 01/23/15  8:42 AM  Result Value Ref Range   Unit Number [id number]    Blood Component Type THAWED PLASMA    Unit division 00    Status of Unit ISSUED    Unit tag comment VERBAL ORDERS PER DR MILLER    Transfusion Status OK TO TRANSFUSE    Unit Number [id number]    Blood Component Type THAWED PLASMA    Unit division 00    Status of Unit ISSUED    Unit tag comment VERBAL ORDERS PER DR MILLER    Transfusion Status OK TO TRANSFUSE   Prepare RBC     Status: None   Collection Time: 01/23/15  9:29 AM  Result Value Ref Range   Order Confirmation ORDER PROCESSED BY BLOOD BANK   Prepare fresh frozen plasma     Status: None (Preliminary result)   Collection Time: 01/23/15  9:30 AM  Result Value Ref Range   Unit Number [id number]    Blood Component Type THWPLS APHR1    Unit division 00    Status of Unit ALLOCATED    Transfusion Status OK TO TRANSFUSE     Ct Chest W Contrast  01/21/2015  CLINICAL DATA:  Mediastinal mass. EXAM: CT CHEST WITH CONTRAST TECHNIQUE: Multidetector CT imaging of the chest was performed during intravenous contrast administration. CONTRAST:  41m OMNIPAQUE IOHEXOL 300 MG/ML  SOLN COMPARISON:  CT abdomen and pelvis dated 01/20/2015. FINDINGS: On yesterday's CT of the abdomen and pelvis, a large ulcerated mass involving the distal stomach and proximal duodenum was identified, with extension to the porta hepatis and gastrohepatic region, suggesting lymphoma. Associated lymphadenopathy was identified in the lower anterior mediastinum, retroperitoneum and pelvis, further suggesting lymphoma. Additional lymphadenopathy was suspected in the sub-carinal region of the mediastinum, incompletely imaged. On today's study, conglomerate subcarinal lymphadenopathy is confirmed, measuring 7.5 x 2.8 cm on image 33 of series 201. This conglomerate lymphadenopathy extends upwards within the mediastinum encasing the mid and lower portions of the trachea, without  effacement of the trachea. At the level of the lower trachea, the conglomerate lymphadenopathy measures approximately 5.9 x 3.9 cm. Additional lymphadenopathy within the anterior mediastinum measures 4.4 x 2.3 cm (image 17, series 201). Additional smaller lymph nodes scattered throughout the mediastinum and bilateral perihilar regions. Numerous small and moderately enlarged lymph nodes are seen within each axillary region. Clustered small and enlarged lymph nodes are seen within  the supraclavicular regions bilaterally. There is a right pleural effusion, small to moderate in size, with adjacent compressive atelectasis. There is also a small left pleural effusion with adjacent atelectasis. Very mild degenerative change noted within the cervical and thoracic spine. No acute osseous abnormality identified. Thoracic aorta is normal in caliber and configuration. Heart size is normal. No pericardial effusion. No central obstructing pulmonary embolism seen. Upper abdominal findings described on yesterday's CT. IMPRESSION: 1. Extensive mediastinal lymphadenopathy, bilateral axillary lymphadenopathy and supraclavicular lymphadenopathy. 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 2. Right pleural effusion, small to moderate in size, with adjacent atelectasis. Additional smaller left pleural effusion. Electronically Signed   By: Franki Cabot M.D.   On: 01/21/2015 16:47   Dg Chest Portable 1 View  01/23/2015  CLINICAL DATA:  Assess orogastric tube positioning EXAM: PORTABLE CHEST 1 VIEW COMPARISON:  Portable chest x-ray of earlier today at 8:01 a.m. FINDINGS: The esophagogastric tube tip projects below the inferior margin of the image. There is no evidence of cord ongoing within the esophagus. The endotracheal tube tip lies 4.1 cm above the carina. The cardiac silhouette is mildly enlarged but stable. The central pulmonary vascularity is  engorged especially on the right. There is mild to moderate pulmonary interstitial edema on the right. There is no pleural effusion or pneumothorax. The observed bony thorax exhibits no acute abnormality. External pacemaker -defibrillator pads. Are present IMPRESSION: 1. The esophagogastric tube tip projects below the inferior margin of the image with no evidence of coiling within the esophagus. The endotracheal tube is in appropriate position. 2. Persistently increased perihilar interstitial density especially on the right consistent with pulmonary interstitial edema. There may be pleural fluid on the right layering posteriorly contributing to the findings here. Electronically Signed   By: David  Martinique M.D.   On: 01/23/2015 09:22   Dg Chest Port 1 View  01/23/2015  CLINICAL DATA:  Status post intubation.  Intra abdominal mass. EXAM: PORTABLE CHEST 1 VIEW COMPARISON:  CT chest 01/21/2015.  CT abdomen 01/21/2015. FINDINGS: The patient has a new endotracheal tube in place with the tip 3.5 cm above the carina. Hazy opacity over the right chest is consistent with layering pleural effusion. Smaller left pleural effusion is noted. No consolidative process or pneumothorax. Heart size is upper normal. IMPRESSION: ET tube in good position with the tip projecting 3.5 cm above the carina. Right greater than left pleural effusions. Electronically Signed   By: Inge Rise M.D.   On: 01/23/2015 08:19   ROS: unobtainable            Blood pressure 165/109, pulse 133, temperature 95 F (35 C), temperature source Temporal, resp. rate 27, height '5\' 11"'$  (1.803 m), SpO2 100 %.  Physical exam:   General-- African-American male with neck brace in place. Intubated and sedated with NG tube training large quantities of bright red blood.  Neck-- stabilizing neck brace in place Heart-- tachycardic Lungs-- no gross wheezes or rales, ventilator sounds Abdomen-- soft nontender nondistended    Assessment: 1.  Acute upper G.I. hemorrhage. Due to ulcerated gastric duodenal mass probably lymphoma. Due to the large size and ulceration this is unlikely to be treatable endoscopically. We can certainly try if everything else fails but currently he's having a large quantity of bright blood coming out of his NG. I think his best chance would be to try to stop the bleeding with IR embolization. 2. Large ulcerated  gastric/duodenal mass. CT of the abdomen and chest most consistent with diffuse lymphoma. Pathology on lymph node biopsy still pending at this time.    Plan: we will follow with you. Agree with going ahead with attempted embolization by IR to control bleeding. Would definitely play some Protonix strip and continue routine support   [name],[name] 01/23/2015, 9:39 AM   Pager: [phone] If no answer or after hours call [phone]

## 2015-01-23 NOTE — Sedation Documentation (Signed)
Patient is resting comfortably. 

## 2015-01-23 NOTE — Anesthesia Postprocedure Evaluation (Signed)
Anesthesia Post Note  Patient: [name]  Procedure(s) Performed: Procedure(s) (LRB): RIGHT INGUINAL LYMPH NODE BX (Right)  Patient location during evaluation: PACU Anesthesia Type: General Level of consciousness: awake Pain management: pain level controlled Vital Signs Assessment: post-procedure vital signs reviewed and stable Respiratory status: spontaneous breathing Cardiovascular status: stable Postop Assessment: no signs of nausea or vomiting Anesthetic complications: no    Last Vitals:  Filed Vitals:   01/22/15 1244 01/22/15 1500  BP: 137/94 135/70  Pulse: 60 62  Temp: 36.2 C 36.4 C  Resp: 13 17    Last Pain:  Filed Vitals:   01/22/15 1623  PainSc: Asleep                 [name]

## 2015-01-23 NOTE — Progress Notes (Signed)
[facility] Progress Note Patient Name: [name] DOB: 1950/08/28 MRN: [id number]   Date of Service  01/23/2015  HPI/Events of Note  pe rn earlier - patient never fell. C spine CT shows no cpsine path. Moves all 4s  eICU Interventions  Dc hard collar     Intervention Category Evaluation Type: Other  Oshae Simmering 01/23/2015, 8:10 PM

## 2015-01-23 NOTE — ED Provider Notes (Signed)
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 infiltration with 1% lidocaine. The left common femoral vein was punctured with an 18 gauge needle. A wire was advanced into the inferior vena cava. The skin tract was dilated and an arrow triple-lumen central venous catheter advanced over the wire and position with the tip at the confluence of the IVC and left common iliac vein. The catheter was flushed, capped and secured with 0 Prolene suture. Sterile bandages were applied. COMPLICATIONS: None IMPRESSION: 1. No evidence of active extravasation. 2. 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 3. Coil embolization of the gastroduodenal artery. 4. Placement of a left femoral triple-lumen central venous catheter. The catheter tip is at the confluence of the IVC and iliac vein and ready for immediate use. Signed, Criselda Peaches, MD [facility] Radiology Electronically Signed   By: Jacqulynn Cadet M.D.   On: 01/23/2015 15:50   Ir Angiogram  Selective Each Additional Vessel  01/23/2015  CLINICAL DATA:  64 year old male presents with acute life-threatening upper GI bleed secondary to an ulcerated mass encompassing the distal stomach and duodenum. 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 Endoscopic treatment options are limited and surgery is extremely high risk. Patient presents for emergent visceral arteriography and embolization. Additionally, central venous access is required. A triple-lumen central venous catheter will be placed during the course of the procedure. EXAM: SELECTIVE VISCERAL ARTERIOGRAPHY; IR EMBO ART VEN HEMORR LYMPH EXTRAV INC GUIDE ROADMAPPING; IR LEFT FLOURO GUIDE CV LINE; ADDITIONAL ARTERIOGRAPHY; IR ULTRASOUND GUIDANCE VASC ACCESS LEFT; ARTERIOGRAPHY Date: 01/23/2015 PROCEDURE: 1. Ultrasound-guided puncture  left common femoral artery 2. Catheterization of the superior mesenteric artery with arteriogram 3. Catheterization of the celiac artery with arteriogram 4. Catheterization of the gastroduodenal artery with arteriogram 5. Catheterization of the right gastroepiploic artery with arteriogram 6. Coil embolization of the gastroduodenal artery 7. Post embolization arteriogram 8. Limited left common femoral arteriogram 9. Ultrasound-guided puncture of the left common femoral vein 10. Placement of a triple-lumen central venous catheter via the left femoral vein with fluoroscopic guidance Interventional Radiologist:  Criselda Peaches, MD ANESTHESIA/SEDATION: Moderate (conscious) sedation was used. 1 mg Versed, 50 mcg Fentanyl were administered intravenously. The patient's vital signs were monitored continuously by radiology nursing throughout the procedure. Sedation Time: 60 minutes MEDICATIONS: None additional FLUOROSCOPY TIME:  16 minutes for a total of 622 mGy CONTRAST:  134mL OMNIPAQUE IOHEXOL 300 MG/ML  SOLN TECHNIQUE: Informed consent was  obtained from the patient following explanation of the procedure, risks, benefits and alternatives. The patient understands, agrees and consents for the procedure. All questions were addressed. A time out was performed. Maximal barrier sterile technique utilized including caps, mask, sterile gowns, sterile gloves, large sterile drape, hand hygiene, and Betadine skin prep. The left groin was interrogated with ultrasound. The common femoral artery is found to be widely patent. An image was obtained and stored for the medical record. Local anesthesia was attained by infiltration with 1% lidocaine. Under real-time sonographic guidance, the vessel was punctured with a 21 gauge micropuncture needle. Using standard technique, the initial micro wire was exchanged through a transitional 5 [place] micro sheath for a working 0.035 inch Bentson wire. The micro sheath was then exchanged for a working 5 [place] vascular sheath. A C2 cobra catheter was advanced into the abdominal aorta over the wire. The catheter was initially advanced into the superior mesenteric artery and superior mesenteric arteriogram was performed. There is conventional anatomy. No replaced or accessory right hepatic artery. No evidence of active extravasation or significant collateralization through the pancreaticoduodenal arcade. The C2 cobra catheter was next advanced into the celiac artery. An arteriogram was performed. The gastroduodenal artery is elongated and slightly irregular in the mid segment likely secondary to external mass effect. There is no evidence of active extravasation. A renegade ST micro catheter was advanced coaxially over a fat and 16 wire. The micro catheter was advanced into the gastroduodenal artery. Arteriography was again performed. There are multiple branches arising from the gastroduodenal artery providing supply to a hypervascular mass in the region the descending duodenum. The micro catheter was carefully advanced more distally  into the proximal right gastroepiploic artery. Contrast injection was performed confirming the location of the catheter tip as well as confirming no additional supplied to the region of tumor. Coil embolization was then performed of the gastroduodenal artery using a combination of vortex, standard helical and soft interlock detachable micro coils ranging in size from 2-6 mm. Post embolization arteriography confirms successful embolization of the vessel with no further visible hypervascular blush of the duodenum tumor. The micro catheter was removed. Repeat celiac artery injection was performed confirming no additional collateral supply. The 5 French catheter was readvanced of the superior mesenteric artery and an additional angiogram was performed again confirming no collateral flow to the embolized territory. The catheters were removed. The vascular sheath was left in place for hemodynamic monitoring. Ultrasound was again used to interrogate the left groin. The left common femoral vein was identified. Local anesthesia was attained by infiltration with 1% lidocaine. The left common femoral vein was punctured with an  18 gauge needle. A wire was advanced into the inferior vena cava. The skin tract was dilated and an arrow triple-lumen central venous catheter advanced over the wire and position with the tip at the confluence of the IVC and left common iliac vein. The catheter was flushed, capped and secured with 0 Prolene suture. Sterile bandages were applied. COMPLICATIONS: None IMPRESSION: 1. No evidence of active extravasation. 2. 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 3. Coil embolization of the gastroduodenal artery. 4. Placement of a left femoral triple-lumen central venous catheter. The catheter tip is at the confluence of the IVC and iliac vein and ready for immediate use. Signed, Criselda Peaches, MD [facility] Radiology Electronically Signed   By: Jacqulynn Cadet M.D.   On: 01/23/2015 15:50   Ir Angiogram Follow Up Study  01/23/2015  CLINICAL DATA:  64 year old male presents with acute life-threatening upper GI bleed secondary to an ulcerated mass encompassing the distal stomach and duodenum. 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 Endoscopic treatment options are limited and surgery is extremely high risk. Patient presents for emergent visceral arteriography and embolization. Additionally, central venous access is required. A triple-lumen central venous catheter will be placed during the course of the procedure. EXAM: SELECTIVE VISCERAL ARTERIOGRAPHY; IR EMBO ART VEN HEMORR LYMPH EXTRAV INC GUIDE ROADMAPPING; IR LEFT FLOURO GUIDE CV LINE; ADDITIONAL ARTERIOGRAPHY; IR ULTRASOUND GUIDANCE VASC ACCESS LEFT; ARTERIOGRAPHY Date: 01/23/2015 PROCEDURE: 1. Ultrasound-guided puncture left common femoral artery 2. Catheterization of the superior mesenteric artery with arteriogram 3. Catheterization of the celiac artery with arteriogram 4. Catheterization of the gastroduodenal artery with arteriogram 5. Catheterization of the right gastroepiploic artery with arteriogram 6. Coil embolization of the gastroduodenal artery 7. Post embolization arteriogram 8. Limited left common femoral arteriogram 9. Ultrasound-guided puncture of the left common femoral vein 10. Placement of a triple-lumen central venous catheter via the left femoral vein with fluoroscopic guidance Interventional Radiologist:  Criselda Peaches, MD ANESTHESIA/SEDATION: Moderate (conscious) sedation was used. 1 mg Versed, 50 mcg Fentanyl were administered intravenously. The patient's vital signs were monitored continuously by radiology nursing throughout the procedure. Sedation Time: 60 minutes MEDICATIONS: None additional  FLUOROSCOPY TIME:  16 minutes for a total of 622 mGy CONTRAST:  181mL OMNIPAQUE IOHEXOL 300 MG/ML  SOLN TECHNIQUE: Informed consent was obtained from the patient following explanation of the procedure, risks, benefits and alternatives. The patient understands, agrees and consents for the procedure. All questions were addressed. A time out was performed. Maximal barrier sterile technique utilized including caps, mask, sterile gowns, sterile gloves, large sterile drape, hand hygiene, and Betadine skin prep. The left groin was interrogated with ultrasound. The common femoral artery is found to be widely patent. An image was obtained and stored for the medical record. Local anesthesia was attained by infiltration with 1% lidocaine. Under real-time sonographic guidance, the vessel was punctured with a 21 gauge micropuncture needle. Using standard technique, the initial micro wire was exchanged through a transitional 5 [place] micro sheath for a working 0.035 inch Bentson wire. The micro sheath was then exchanged for a working 5 [place] vascular sheath. A C2 cobra catheter was advanced into the abdominal aorta over the wire. The catheter was initially advanced into the superior mesenteric artery and superior mesenteric arteriogram was performed. There is conventional anatomy. No replaced or accessory right hepatic artery. No evidence of  active extravasation or significant collateralization through the pancreaticoduodenal arcade. The C2 cobra catheter was next advanced into the celiac artery. An arteriogram was performed. The gastroduodenal artery is elongated and slightly irregular in the mid segment likely secondary to external mass effect. There is no evidence of active extravasation. A renegade ST micro catheter was advanced coaxially over a fat and 16 wire. The micro catheter was advanced into the gastroduodenal artery. Arteriography was again performed. There are multiple branches arising from the gastroduodenal artery  providing supply to a hypervascular mass in the region the descending duodenum. The micro catheter was carefully advanced more distally into the proximal right gastroepiploic artery. Contrast injection was performed confirming the location of the catheter tip as well as confirming no additional supplied to the region of tumor. Coil embolization was then performed of the gastroduodenal artery using a combination of vortex, standard helical and soft interlock detachable micro coils ranging in size from 2-6 mm. Post embolization arteriography confirms successful embolization of the vessel with no further visible hypervascular blush of the duodenum tumor. The micro catheter was removed. Repeat celiac artery injection was performed confirming no additional collateral supply. The 5 French catheter was readvanced of the superior mesenteric artery and an additional angiogram was performed again confirming no collateral flow to the embolized territory. The catheters were removed. The vascular sheath was left in place for hemodynamic monitoring. Ultrasound was again used to interrogate the left groin. The left common femoral vein was identified. Local anesthesia was attained by infiltration with 1% lidocaine. The left common femoral vein was punctured with an 18 gauge needle. A wire was advanced into the inferior vena cava. The skin tract was dilated and an arrow triple-lumen central venous catheter advanced over the wire and position with the tip at the confluence of the IVC and left common iliac vein. The catheter was flushed, capped and secured with 0 Prolene suture. Sterile bandages were applied. COMPLICATIONS: None IMPRESSION: 1. No evidence of active extravasation. 2. 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 3. Coil embolization of the gastroduodenal artery. 4. Placement of a left femoral triple-lumen central venous catheter. The  catheter tip is at the confluence of the IVC and iliac vein and ready for immediate use. Signed, Criselda Peaches, MD [facility] Radiology Electronically Signed   By: Jacqulynn Cadet M.D.   On: 01/23/2015 15:50   Ir Fluoro Guide Cv Line Left  01/23/2015  CLINICAL DATA:  64 year old male presents with acute life-threatening upper GI bleed secondary to an ulcerated mass encompassing the distal stomach and duodenum. 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 Endoscopic treatment options are limited and surgery is extremely high risk. Patient presents for emergent visceral arteriography and embolization. Additionally, central venous access is required. A triple-lumen central venous catheter will be placed during the course of the procedure. EXAM: SELECTIVE VISCERAL ARTERIOGRAPHY; IR EMBO ART VEN HEMORR LYMPH EXTRAV INC GUIDE ROADMAPPING; IR LEFT FLOURO GUIDE CV LINE; ADDITIONAL ARTERIOGRAPHY; IR ULTRASOUND GUIDANCE VASC ACCESS LEFT; ARTERIOGRAPHY Date: 01/23/2015 PROCEDURE: 1. Ultrasound-guided puncture left common femoral artery 2. Catheterization of the superior mesenteric artery with arteriogram 3. Catheterization of the celiac artery with arteriogram 4. Catheterization of the gastroduodenal artery with arteriogram 5. Catheterization of the right gastroepiploic artery with arteriogram 6. Coil embolization of the gastroduodenal artery 7. Post embolization arteriogram 8. Limited left common femoral arteriogram  9. Ultrasound-guided puncture of the left common femoral vein 10. Placement of a triple-lumen central venous catheter via the left femoral vein with fluoroscopic guidance Interventional Radiologist:  Criselda Peaches, MD ANESTHESIA/SEDATION: Moderate (conscious) sedation was used. 1 mg Versed, 50 mcg Fentanyl were administered intravenously. The patient's vital signs  were monitored continuously by radiology nursing throughout the procedure. Sedation Time: 60 minutes MEDICATIONS: None additional FLUOROSCOPY TIME:  16 minutes for a total of 622 mGy CONTRAST:  190mL OMNIPAQUE IOHEXOL 300 MG/ML  SOLN TECHNIQUE: Informed consent was obtained from the patient following explanation of the procedure, risks, benefits and alternatives. The patient understands, agrees and consents for the procedure. All questions were addressed. A time out was performed. Maximal barrier sterile technique utilized including caps, mask, sterile gowns, sterile gloves, large sterile drape, hand hygiene, and Betadine skin prep. The left groin was interrogated with ultrasound. The common femoral artery is found to be widely patent. An image was obtained and stored for the medical record. Local anesthesia was attained by infiltration with 1% lidocaine. Under real-time sonographic guidance, the vessel was punctured with a 21 gauge micropuncture needle. Using standard technique, the initial micro wire was exchanged through a transitional 5 [place] micro sheath for a working 0.035 inch Bentson wire. The micro sheath was then exchanged for a working 5 [place] vascular sheath. A C2 cobra catheter was advanced into the abdominal aorta over the wire. The catheter was initially advanced into the superior mesenteric artery and superior mesenteric arteriogram was performed. There is conventional anatomy. No replaced or accessory right hepatic artery. No evidence of active extravasation or significant collateralization through the pancreaticoduodenal arcade. The C2 cobra catheter was next advanced into the celiac artery. An arteriogram was performed. The gastroduodenal artery is elongated and slightly irregular in the mid segment likely secondary to external mass effect. There is no evidence of active extravasation. A renegade ST micro catheter was advanced coaxially over a fat and 16 wire. The micro catheter was advanced  into the gastroduodenal artery. Arteriography was again performed. There are multiple branches arising from the gastroduodenal artery providing supply to a hypervascular mass in the region the descending duodenum. The micro catheter was carefully advanced more distally into the proximal right gastroepiploic artery. Contrast injection was performed confirming the location of the catheter tip as well as confirming no additional supplied to the region of tumor. Coil embolization was then performed of the gastroduodenal artery using a combination of vortex, standard helical and soft interlock detachable micro coils ranging in size from 2-6 mm. Post embolization arteriography confirms successful embolization of the vessel with no further visible hypervascular blush of the duodenum tumor. The micro catheter was removed. Repeat celiac artery injection was performed confirming no additional collateral supply. The 5 French catheter was readvanced of the superior mesenteric artery and an additional angiogram was performed again confirming no collateral flow to the embolized territory. The catheters were removed. The vascular sheath was left in place for hemodynamic monitoring. Ultrasound was again used to interrogate the left groin. The left common femoral vein was identified. Local anesthesia was attained by infiltration with 1% lidocaine. The left common femoral vein was punctured with an 18 gauge needle. A wire was advanced into the inferior vena cava. The skin tract was dilated and an arrow triple-lumen central venous catheter advanced over the wire and position with the tip at the confluence of the IVC and left common iliac vein. The catheter was flushed, capped and secured with 0 Prolene suture.  Sterile bandages were applied. COMPLICATIONS: None IMPRESSION: 1. No evidence of active extravasation. 2. Positive hypervascular tumor blush in the region of the descending duodenum with arterial supply from the gastroduodenal  artery. 3. Coil embolization of the gastroduodenal artery. 4. Placement of a left femoral triple-lumen central venous catheter. The catheter tip is at the confluence of the IVC and iliac vein and ready for immediate use. Signed, Criselda Peaches, MD [facility] Radiology Electronically Signed   By: Jacqulynn Cadet M.D.   On: 01/23/2015 15:50   Ir US Guide Vasc Access Left  01/23/2015  CLINICAL DATA:  64 year old male presents with acute life-threatening upper GI bleed secondary to an ulcerated mass encompassing the distal stomach and duodenum. 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 Endoscopic treatment options are limited and surgery is extremely high risk. Patient presents for emergent visceral arteriography and embolization. Additionally, central venous access is required. A triple-lumen central venous catheter will be placed during the course of the procedure. EXAM: SELECTIVE VISCERAL ARTERIOGRAPHY; IR EMBO ART VEN HEMORR LYMPH EXTRAV INC GUIDE ROADMAPPING; IR LEFT FLOURO GUIDE CV LINE; ADDITIONAL ARTERIOGRAPHY; IR ULTRASOUND GUIDANCE VASC ACCESS LEFT; ARTERIOGRAPHY Date: 01/23/2015 PROCEDURE: 1. Ultrasound-guided puncture left common femoral artery 2. Catheterization of the superior mesenteric artery with arteriogram 3. Catheterization of the celiac artery with arteriogram 4. Catheterization of the gastroduodenal artery with arteriogram 5. Catheterization of the right gastroepiploic artery with arteriogram 6. Coil embolization of the gastroduodenal artery 7. Post embolization arteriogram 8. Limited left common femoral arteriogram 9. Ultrasound-guided puncture of the left common femoral vein 10. Placement of a triple-lumen central venous catheter via the left femoral vein with fluoroscopic guidance Interventional Radiologist:  Criselda Peaches, MD  ANESTHESIA/SEDATION: Moderate (conscious) sedation was used. 1 mg Versed, 50 mcg Fentanyl were administered intravenously. The patient's vital signs were monitored continuously by radiology nursing throughout the procedure. Sedation Time: 60 minutes MEDICATIONS: None additional FLUOROSCOPY TIME:  16 minutes for a total of 622 mGy CONTRAST:  138mL OMNIPAQUE IOHEXOL 300 MG/ML  SOLN TECHNIQUE: Informed consent was obtained from the patient following explanation of the procedure, risks, benefits and alternatives. The patient understands, agrees and consents for the procedure. All questions were addressed. A time out was performed. Maximal barrier sterile technique utilized including caps, mask, sterile gowns, sterile gloves, large sterile drape, hand hygiene, and Betadine skin prep. The left groin was interrogated with ultrasound. The common femoral artery is found to be widely patent. An image was obtained and stored for the medical record. Local anesthesia was attained by infiltration with 1% lidocaine. Under real-time sonographic guidance, the vessel was punctured with a 21 gauge micropuncture needle. Using standard technique, the initial micro wire was exchanged through a transitional 5 [place] micro sheath for a working 0.035 inch Bentson wire. The micro sheath was then exchanged for a working 5 [place] vascular sheath. A C2 cobra catheter was advanced into the abdominal aorta over the wire. The catheter was initially advanced into the superior mesenteric artery and superior mesenteric arteriogram was performed. There is conventional anatomy. No replaced or accessory right hepatic artery. No evidence of active extravasation or significant collateralization through the pancreaticoduodenal arcade. The C2 cobra catheter was next advanced into the celiac artery. An arteriogram was performed. The gastroduodenal artery is elongated and slightly irregular in the mid segment likely secondary to external mass effect. There is  no evidence of active extravasation. A renegade ST micro  catheter was advanced coaxially over a fat and 16 wire. The micro catheter was advanced into the gastroduodenal artery. Arteriography was again performed. There are multiple branches arising from the gastroduodenal artery providing supply to a hypervascular mass in the region the descending duodenum. The micro catheter was carefully advanced more distally into the proximal right gastroepiploic artery. Contrast injection was performed confirming the location of the catheter tip as well as confirming no additional supplied to the region of tumor. Coil embolization was then performed of the gastroduodenal artery using a combination of vortex, standard helical and soft interlock detachable micro coils ranging in size from 2-6 mm. Post embolization arteriography confirms successful embolization of the vessel with no further visible hypervascular blush of the duodenum tumor. The micro catheter was removed. Repeat celiac artery injection was performed confirming no additional collateral supply. The 5 French catheter was readvanced of the superior mesenteric artery and an additional angiogram was performed again confirming no collateral flow to the embolized territory. The catheters were removed. The vascular sheath was left in place for hemodynamic monitoring. Ultrasound was again used to interrogate the left groin. The left common femoral vein was identified. Local anesthesia was attained by infiltration with 1% lidocaine. The left common femoral vein was punctured with an 18 gauge needle. A wire was advanced into the inferior vena cava. The skin tract was dilated and an arrow triple-lumen central venous catheter advanced over the wire and position with the tip at the confluence of the IVC and left common iliac vein. The catheter was flushed, capped and secured with 0 Prolene suture. Sterile bandages were applied. COMPLICATIONS: None IMPRESSION: 1. No evidence of  active extravasation. 2. 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 3. Coil embolization of the gastroduodenal artery. 4. Placement of a left femoral triple-lumen central venous catheter. The catheter tip is at the confluence of the IVC and iliac vein and ready for immediate use. Signed, Criselda Peaches, MD [facility] Radiology Electronically Signed   By: Jacqulynn Cadet M.D.   On: 01/23/2015 15:50   Ir US Guide Vasc Access Left  01/23/2015  CLINICAL DATA:  64 year old male presents with acute life-threatening upper GI bleed secondary to an ulcerated mass encompassing the distal stomach and duodenum. 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 Endoscopic treatment options are limited and surgery is extremely high risk. Patient presents for emergent visceral arteriography and embolization. Additionally, central venous access is required. A triple-lumen central venous catheter will be placed during the course of the procedure. EXAM: SELECTIVE VISCERAL ARTERIOGRAPHY; IR EMBO ART VEN HEMORR LYMPH EXTRAV INC GUIDE ROADMAPPING; IR LEFT FLOURO GUIDE CV LINE; ADDITIONAL ARTERIOGRAPHY; IR ULTRASOUND GUIDANCE VASC ACCESS LEFT; ARTERIOGRAPHY Date: 01/23/2015 PROCEDURE: 1. Ultrasound-guided puncture left common femoral artery 2. Catheterization of the superior mesenteric artery with arteriogram 3. Catheterization of the celiac artery with arteriogram 4. Catheterization of the gastroduodenal artery with arteriogram 5. Catheterization of the right gastroepiploic artery with arteriogram 6. Coil embolization of the gastroduodenal artery 7. Post embolization arteriogram 8. Limited left common femoral arteriogram 9. Ultrasound-guided puncture of the left common femoral vein 10. Placement of a triple-lumen  central venous catheter via the left femoral vein with fluoroscopic guidance Interventional Radiologist:  Criselda Peaches, MD ANESTHESIA/SEDATION: Moderate (conscious) sedation was used. 1 mg Versed, 50 mcg Fentanyl were administered intravenously. The patient's vital signs were monitored  continuously by radiology nursing throughout the procedure. Sedation Time: 60 minutes MEDICATIONS: None additional FLUOROSCOPY TIME:  16 minutes for a total of 622 mGy CONTRAST:  17mL OMNIPAQUE IOHEXOL 300 MG/ML  SOLN TECHNIQUE: Informed consent was obtained from the patient following explanation of the procedure, risks, benefits and alternatives. The patient understands, agrees and consents for the procedure. All questions were addressed. A time out was performed. Maximal barrier sterile technique utilized including caps, mask, sterile gowns, sterile gloves, large sterile drape, hand hygiene, and Betadine skin prep. The left groin was interrogated with ultrasound. The common femoral artery is found to be widely patent. An image was obtained and stored for the medical record. Local anesthesia was attained by infiltration with 1% lidocaine. Under real-time sonographic guidance, the vessel was punctured with a 21 gauge micropuncture needle. Using standard technique, the initial micro wire was exchanged through a transitional 5 [place] micro sheath for a working 0.035 inch Bentson wire. The micro sheath was then exchanged for a working 5 [place] vascular sheath. A C2 cobra catheter was advanced into the abdominal aorta over the wire. The catheter was initially advanced into the superior mesenteric artery and superior mesenteric arteriogram was performed. There is conventional anatomy. No replaced or accessory right hepatic artery. No evidence of active extravasation or significant collateralization through the pancreaticoduodenal arcade. The C2 cobra catheter was next advanced into the celiac artery. An arteriogram was performed.  The gastroduodenal artery is elongated and slightly irregular in the mid segment likely secondary to external mass effect. There is no evidence of active extravasation. A renegade ST micro catheter was advanced coaxially over a fat and 16 wire. The micro catheter was advanced into the gastroduodenal artery. Arteriography was again performed. There are multiple branches arising from the gastroduodenal artery providing supply to a hypervascular mass in the region the descending duodenum. The micro catheter was carefully advanced more distally into the proximal right gastroepiploic artery. Contrast injection was performed confirming the location of the catheter tip as well as confirming no additional supplied to the region of tumor. Coil embolization was then performed of the gastroduodenal artery using a combination of vortex, standard helical and soft interlock detachable micro coils ranging in size from 2-6 mm. Post embolization arteriography confirms successful embolization of the vessel with no further visible hypervascular blush of the duodenum tumor. The micro catheter was removed. Repeat celiac artery injection was performed confirming no additional collateral supply. The 5 French catheter was readvanced of the superior mesenteric artery and an additional angiogram was performed again confirming no collateral flow to the embolized territory. The catheters were removed. The vascular sheath was left in place for hemodynamic monitoring. Ultrasound was again used to interrogate the left groin. The left common femoral vein was identified. Local anesthesia was attained by infiltration with 1% lidocaine. The left common femoral vein was punctured with an 18 gauge needle. A wire was advanced into the inferior vena cava. The skin tract was dilated and an arrow triple-lumen central venous catheter advanced over the wire and position with the tip at the confluence of the IVC and left common iliac vein. The catheter was  flushed, capped and secured with 0 Prolene suture. Sterile bandages were applied. COMPLICATIONS: None IMPRESSION: 1. No evidence of active extravasation. 2. 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 3. Coil embolization of the gastroduodenal artery. 4. Placement of a left femoral triple-lumen central venous catheter. The catheter tip is  at the confluence of the IVC and iliac vein and ready for immediate use. Signed, Criselda Peaches, MD [facility] Radiology Electronically Signed   By: Jacqulynn Cadet M.D.   On: 01/23/2015 15:50   Portable Chest Xray  01/24/2015  CLINICAL DATA:  Respiratory failure. EXAM: PORTABLE CHEST 1 VIEW COMPARISON:  01/23/2015 FINDINGS: Endotracheal tube and NG tube in stable position. Cardiomegaly with normal pulmonary vascularity.Persistent increased density noted over the right Lower lung, most likely layering pleural effusion. Underlying pulmonary infiltrate cannot be excluded. Small left pleural effusion cannot be excluded. Low lung volumes basilar atelectasis. No pneumothorax. IMPRESSION: 1. Lines and tubes in stable position. 2. Persistent increased density of the right lower lung, most likely layering pleural effusion. Underlying infiltrate cannot be excluded. 3. Low lung volumes with basilar atelectasis. Electronically Signed   By: Marcello Moores  Register   On: 01/24/2015 07:39   Dg Chest Portable 1 View  01/23/2015  CLINICAL DATA:  Assess orogastric tube positioning EXAM: PORTABLE CHEST 1 VIEW COMPARISON:  Portable chest x-ray of earlier today at 8:01 a.m. FINDINGS: The esophagogastric tube tip projects below the inferior margin of the image. There is no evidence of cord ongoing within the esophagus. The endotracheal tube tip lies 4.1 cm above the carina. The cardiac silhouette is mildly enlarged but stable. The central pulmonary vascularity  is engorged especially on the right. There is mild to moderate pulmonary interstitial edema on the right. There is no pleural effusion or pneumothorax. The observed bony thorax exhibits no acute abnormality. External pacemaker -defibrillator pads. Are present IMPRESSION: 1. The esophagogastric tube tip projects below the inferior margin of the image with no evidence of coiling within the esophagus. The endotracheal tube is in appropriate position. 2. Persistently increased perihilar interstitial density especially on the right consistent with pulmonary interstitial edema. There may be pleural fluid on the right layering posteriorly contributing to the findings here. Electronically Signed   By: David  Martinique M.D.   On: 01/23/2015 09:22   Dg Chest Port 1 View  01/23/2015  CLINICAL DATA:  Status post intubation.  Intra abdominal mass. EXAM: PORTABLE CHEST 1 VIEW COMPARISON:  CT chest 01/21/2015.  CT abdomen 01/21/2015. FINDINGS: The patient has a new endotracheal tube in place with the tip 3.5 cm above the carina. Hazy opacity over the right chest is consistent with layering pleural effusion. Smaller left pleural effusion is noted. No consolidative process or pneumothorax. Heart size is upper normal. IMPRESSION: ET tube in good position with the tip projecting 3.5 cm above the carina. Right greater than left pleural effusions. Electronically Signed   By: Inge Rise M.D.   On: 01/23/2015 08:19   [place] Guide Roadmapping  01/23/2015  CLINICAL DATA:  64 year old male presents with acute life-threatening upper GI bleed secondary to an ulcerated mass encompassing the distal stomach and duodenum. 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 Endoscopic treatment options are limited and surgery is extremely high risk. Patient presents for emergent visceral arteriography and embolization.  Additionally, central venous access is required. A triple-lumen central venous catheter will be placed during the course of the procedure. EXAM: SELECTIVE VISCERAL ARTERIOGRAPHY; IR EMBO ART VEN HEMORR LYMPH EXTRAV INC GUIDE ROADMAPPING; IR LEFT FLOURO GUIDE CV LINE; ADDITIONAL ARTERIOGRAPHY; IR ULTRASOUND GUIDANCE VASC ACCESS LEFT; ARTERIOGRAPHY Date: 01/23/2015 PROCEDURE: 1. Ultrasound-guided puncture left common femoral artery 2. Catheterization of the superior  mesenteric artery with arteriogram 3. Catheterization of the celiac artery with arteriogram 4. Catheterization of the gastroduodenal artery with arteriogram 5. Catheterization of the right gastroepiploic artery with arteriogram 6. Coil embolization of the gastroduodenal artery 7. Post embolization arteriogram 8. Limited left common femoral arteriogram 9. Ultrasound-guided puncture of the left common femoral vein 10. Placement of a triple-lumen central venous catheter via the left femoral vein with fluoroscopic guidance Interventional Radiologist:  Criselda Peaches, MD ANESTHESIA/SEDATION: Moderate (conscious) sedation was used. 1 mg Versed, 50 mcg Fentanyl were administered intravenously. The patient's vital signs were monitored continuously by radiology nursing throughout the procedure. Sedation Time: 60 minutes MEDICATIONS: None additional FLUOROSCOPY TIME:  16 minutes for a total of 622 mGy CONTRAST:  174mL OMNIPAQUE IOHEXOL 300 MG/ML  SOLN TECHNIQUE: Informed consent was obtained from the patient following explanation of the procedure, risks, benefits and alternatives. The patient understands, agrees and consents for the procedure. All questions were addressed. A time out was performed. Maximal barrier sterile technique utilized including caps, mask, sterile gowns, sterile gloves, large sterile drape, hand hygiene, and Betadine skin prep. The left groin was interrogated with ultrasound. The common femoral artery is found to be widely patent. An  image was obtained and stored for the medical record. Local anesthesia was attained by infiltration with 1% lidocaine. Under real-time sonographic guidance, the vessel was punctured with a 21 gauge micropuncture needle. Using standard technique, the initial micro wire was exchanged through a transitional 5 [place] micro sheath for a working 0.035 inch Bentson wire. The micro sheath was then exchanged for a working 5 [place] vascular sheath. A C2 cobra catheter was advanced into the abdominal aorta over the wire. The catheter was initially advanced into the superior mesenteric artery and superior mesenteric arteriogram was performed. There is conventional anatomy. No replaced or accessory right hepatic artery. No evidence of active extravasation or significant collateralization through the pancreaticoduodenal arcade. The C2 cobra catheter was next advanced into the celiac artery. An arteriogram was performed. The gastroduodenal artery is elongated and slightly irregular in the mid segment likely secondary to external mass effect. There is no evidence of active extravasation. A renegade ST micro catheter was advanced coaxially over a fat and 16 wire. The micro catheter was advanced into the gastroduodenal artery. Arteriography was again performed. There are multiple branches arising from the gastroduodenal artery providing supply to a hypervascular mass in the region the descending duodenum. The micro catheter was carefully advanced more distally into the proximal right gastroepiploic artery. Contrast injection was performed confirming the location of the catheter tip as well as confirming no additional supplied to the region of tumor. Coil embolization was then performed of the gastroduodenal artery using a combination of vortex, standard helical and soft interlock detachable micro coils ranging in size from 2-6 mm. Post embolization arteriography confirms successful embolization of the vessel with no further visible  hypervascular blush of the duodenum tumor. The micro catheter was removed. Repeat celiac artery injection was performed confirming no additional collateral supply. The 5 French catheter was readvanced of the superior mesenteric artery and an additional angiogram was performed again confirming no collateral flow to the embolized territory. The catheters were removed. The vascular sheath was left in place for hemodynamic monitoring. Ultrasound was again used to interrogate the left groin. The left common femoral vein was identified. Local anesthesia was attained by infiltration with 1% lidocaine. The left common femoral vein was punctured with an 18 gauge needle. A wire was advanced into the  inferior vena cava. The skin tract was dilated and an arrow triple-lumen central venous catheter advanced over the wire and position with the tip at the confluence of the IVC and left common iliac vein. The catheter was flushed, capped and secured with 0 Prolene suture. Sterile bandages were applied. COMPLICATIONS: None IMPRESSION: 1. No evidence of active extravasation. 2. 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 3. Coil embolization of the gastroduodenal artery. 4. Placement of a left femoral triple-lumen central venous catheter. The catheter tip is at the confluence of the IVC and iliac vein and ready for immediate use. Signed, Criselda Peaches, MD [facility] Radiology Electronically Signed   By: Jacqulynn Cadet M.D.   On: 01/23/2015 15:50   I have personally reviewed and evaluated these images and lab results as part of my medical decision-making.   EKG Interpretation   Date/Time:  Tuesday January 23 2015 07:47:48 EST Ventricular Rate:  97 PR Interval:  161 QRS Duration: 79 QT Interval:  386 QTC Calculation: 490 R Axis:   76 Text Interpretation:  Sinus rhythm Atrial  premature complex Consider right  atrial enlargement Borderline repolarization abnormality Borderline  prolonged QT interval Artifact in lead(s) V5 V6 Since last tracing Atrial  fibrillation NOW PRESENT Confirmed by Sherril Heyward  MD, Brennah Quraishi ([id number]) on  01/23/2015 8:25:09 AM      MDM   Final diagnoses:  History of ETT  Acute GI bleeding  Acute respiratory failure (HCC)  Bleeding    Review of the medical record shows that the patient is likely having a large gastrointestinal bleed. As he went to intubate the patient in extremely large amount of blood came back up the esophagus filling the oropharynx requiring constant suction. Endotracheal intubation was achieved on first pass under direct laryngoscopy with cervical spine immobilization kept in place. Cervical collar was applied secondary to the patient's presenting fall. His vital signs show significant hypotension with a pressure of 60/40, he is currently getting IV fluid bolus, blood on the rapid transfuser, immediate consultation to GI and general surgery has been placed  Gen surg returned the call at 8:10 AM. GI repaged. GI - will come see - recommend IR IR - will come see - try to embolize CC paged and will admit  The patient continues to be unstable with persistent tachycardia, his blood pressure has improved with transfusion of blood products as well as FFP. Interventional radiology will take the patient for an interventional procedure. The patient is critically ill.  CRITICAL CARE Performed by: Johnna Acosta Total critical care time: 35 minutes Critical care time was exclusive of separately billable procedures and treating other patients. Critical care was necessary to treat or prevent imminent or life-threatening deterioration. Critical care was time spent personally by me on the following activities: development of treatment plan with patient and/or surrogate as well as nursing, discussions with consultants, evaluation of patient's  response to treatment, examination of patient, obtaining history from patient or surrogate, ordering and performing treatments and interventions, ordering and review of laboratory studies, ordering and review of radiographic studies, pulse oximetry and re-evaluation of patient's condition.   INTUBATION Performed by: Johnna Acosta  Required items: required blood products, implants, devices, and special equipment available Patient identity confirmed: provided demographic data and hospital-assigned identification number Time out: Immediately prior to procedure a "time out" was called to verify the correct patient, procedure, equipment, support staff and site/side marked  as required.  Indications: Upper GI bleed, decreased level of consciousness   Intubation method: Direct  Laryngoscopy   Preoxygenation: BVM  Sedatives: 20 mg Etomidate Paralytic: 100 mg of rocuronium   Tube Size: 8-0 cuffed  Post-procedure assessment: chest rise and ETCO2 monitor Breath sounds: equal and absent over the epigastrium Tube secured with: ETT holder Chest x-ray interpreted by radiologist and me.  Chest x-ray findings: endotracheal tube in appropriate position  Patient tolerated the procedure well with no immediate complications.     Noemi Chapel, MD 01/24/15 [phone]

## 2015-01-23 NOTE — Progress Notes (Signed)
ANTIBIOTIC CONSULT NOTE - INITIAL  Pharmacy Consult for Zosyn Indication: pneumonia  No Known Allergies  Patient Measurements: Height: 5\' 11"  (180.3 cm) IBW/kg (Calculated) : 75.3  Vital Signs: Temp: 95 F (35 C) (12/20 0940) Temp Source: Temporal (12/20 0804) BP: 165/109 mmHg (12/20 0930) Pulse Rate: 125 (12/20 0940) Intake/Output from previous day:   Intake/Output from this shift:    Labs:  Recent Labs  01/21/15 1617 01/22/15 0400 01/23/15 0751 01/23/15 0812  WBC 6.1 4.9 7.5  --   HGB 8.6* 8.4* 6.3* 6.8*  PLT 360 314 338  --   CREATININE  --  0.94 1.71* 1.50*   Estimated Creatinine Clearance: 59.4 mL/min (by C-G formula based on Cr of 1.5). No results for input(s): VANCOTROUGH, VANCOPEAK, VANCORANDOM, GENTTROUGH, GENTPEAK, GENTRANDOM, TOBRATROUGH, TOBRAPEAK, TOBRARND, AMIKACINPEAK, AMIKACINTROU, AMIKACIN in the last 72 hours.   Microbiology: Recent Results (from the past 720 hour(s))  MRSA PCR Screening     Status: None   Collection Time: 01/17/15  9:07 PM  Result Value Ref Range Status   MRSA by PCR NEGATIVE NEGATIVE Final    Comment:        The GeneXpert MRSA Assay (FDA approved for NASAL specimens only), is one component of a comprehensive MRSA colonization surveillance program. It is not intended to diagnose MRSA infection nor to guide or monitor treatment for MRSA infections.   Surgical pcr screen     Status: Abnormal   Collection Time: 01/22/15  3:10 AM  Result Value Ref Range Status   MRSA, PCR NEGATIVE NEGATIVE Final   Staphylococcus aureus POSITIVE (A) NEGATIVE Final    Comment:        The Xpert SA Assay (FDA approved for NASAL specimens in patients over 75 years of age), is one component of a comprehensive surveillance program.  Test performance has been validated by [facility] for patients greater than or equal to 68 year old. It is not intended to diagnose infection nor to guide or monitor treatment.     Medical History: Past  Medical History  Diagnosis Date  . Essential hypertension   . HLD (hyperlipidemia)    Assessment:  64 y/o M w/ PMH of HTN, HLD on 12/20 with witnessed fall. Pharmacy consulted to dose Zosyn for pneumonia. Temp 95, WBC WNL.   Goal of Therapy:  Resolution of infection  Plan:  Zosyn IV 3.375g x 1, then Zosyn IV 3.375g q8h (4hr infusion) F/u renal function, LOT, C&S, clinical course  Angela Burke, PharmD Pharmacy Resident Pager: [phone] 01/23/2015,9:45 AM
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
## 2015-01-23 NOTE — ED Notes (Signed)
Family to be escorted to Conference room via [place], chaplain paged at this time

## 2015-01-23 NOTE — Sedation Documentation (Signed)
Patient is resting comfortably. Vitals stable. Respiratory therapy at bedside for procedure

## 2015-01-23 NOTE — ED Notes (Signed)
Per Sabra Heck, MD pt not to have OG placed at this time

## 2015-01-23 NOTE — ED Notes (Signed)
Wife with pt at bedside with chaplain

## 2015-01-23 NOTE — Consult Note (Signed)
Chief Complaint: Patient was seen in consultation today for  mesenteric/visceral arteriogram with possible embolization  Referring Physician(s): Miller,B/CCS History of Present Illness: [name] is a 64 y.o. male with history of hypertension, hyperlipidemia and recent syncopal episodes. Patient was seen in ED on 12/14 after syncopal spell and found to be gliotic positive with hemoglobin 4.5. Subsequent to transfuse and underwent EGD showing a very large duodenal ulcer with clot. Subserosal CT abdomen pelvis on 12/17 showed adenopathy anterior mediastinum, retroperitoneum and pelvis with large ulcerated mass involving the distal stomach/proximal duodenum and extending into the gastroduodenal attic region. On 12/19 the patient underwent right inguinal lymph node biopsy. Path is pending. He was discharged home later that day. Patient presents again today with syncopal episode at home after eating grits and sausage. Patient's wife stated that he experienced some abdominal fullness along with nausea and diaphoresis prior to the fall. During transport to the hospital the patient was combative and had to be restrained. During intubation he vomited a large volume of blood in the stomach. Most recent hemoglobin 6.3. He is being transfused. Creatinine is 1.5, potassium 5.5, platelets 338K, PT 18.4, INR 1.53. Request has now been received for mesenteric/visceral arteriogram with possible embolization.  Past Medical History  Diagnosis Date  . Essential hypertension   . HLD (hyperlipidemia)     Past Surgical History  Procedure Laterality Date  . Skin surgery      Small benign cysts over left scalp removed  . Esophagogastroduodenoscopy Left 01/18/2015    Procedure: ESOPHAGOGASTRODUODENOSCOPY (EGD);  Surgeon: Wilford Corner, MD;  Location: [facility] ENDOSCOPY;  Service: Endoscopy;  Laterality: Left;    Allergies: Review of patient's allergies indicates no known allergies.  Medications: Prior to  Admission medications   Medication Sig Start Date End Date Taking? Authorizing Provider  omeprazole (PRILOSEC) 40 MG capsule Take 1 capsule (40 mg total) by mouth 2 (two) times daily. 01/22/15   Bonnielee Haff, MD     Family History  Problem Relation Age of Onset  . Hypertension Mother   . Hypertension Father   . Hypertension Sister   . Diabetes Sister   . Prostate cancer Brother   . Lupus Sister   . Kidney failure Father     Social History   Social History  . Marital Status: Married    Spouse Name: N/A  . Number of Children: N/A  . Years of Education: N/A   Social History Main Topics  . Smoking status: Never Smoker   . Smokeless tobacco: Not on file  . Alcohol Use: No  . Drug Use: No  . Sexual Activity: Not on file   Other Topics Concern  . Not on file   Social History Narrative     Review of Systems see above; pt currently intubated; hx obtained from wife  Vital Signs: BP 187/105 mmHg  Pulse 127  Temp(Src) 95 F (35 C) (Temporal)  Resp 20  Ht 5\' 11"  (1.803 m)  SpO2 100%  Physical Exam patient intubated, sedated. Diminished breath sounds on right, left clear. Heart tachycardic but regular rhythm. Abdomen soft, few bowel sounds, extremities with intact distal pulses, no significant edema. Incision site right groin clean and dry.  Mallampati Score:     Imaging: Ct Chest W Contrast  01/21/2015  CLINICAL DATA:  Mediastinal mass. EXAM: CT CHEST WITH CONTRAST TECHNIQUE: Multidetector CT imaging of the chest was performed during intravenous contrast administration. CONTRAST:  21mL OMNIPAQUE IOHEXOL 300 MG/ML  SOLN COMPARISON:  CT abdomen and pelvis dated 01/20/2015. FINDINGS: On yesterday's CT of the abdomen and pelvis, a large ulcerated mass involving the distal stomach and proximal duodenum was identified, with extension to the porta hepatis and gastrohepatic region, suggesting lymphoma. Associated lymphadenopathy was identified in the lower anterior mediastinum,  retroperitoneum and pelvis, further suggesting lymphoma. Additional lymphadenopathy was suspected in the sub-carinal region of the mediastinum, incompletely imaged. On today's study, conglomerate subcarinal lymphadenopathy is confirmed, measuring 7.5 x 2.8 cm on image 33 of series 201. This conglomerate lymphadenopathy extends upwards within the mediastinum encasing the mid and lower portions of the trachea, without effacement of the trachea. At the level of the lower trachea, the conglomerate lymphadenopathy measures approximately 5.9 x 3.9 cm. Additional lymphadenopathy within the anterior mediastinum measures 4.4 x 2.3 cm (image 17, series 201). Additional smaller lymph nodes scattered throughout the mediastinum and bilateral perihilar regions. Numerous small and moderately enlarged lymph nodes are seen within each axillary region. Clustered small and enlarged lymph nodes are seen within the supraclavicular regions bilaterally. There is a right pleural effusion, small to moderate in size, with adjacent compressive atelectasis. There is also a small left pleural effusion with adjacent atelectasis. Very mild degenerative change noted within the cervical and thoracic spine. No acute osseous abnormality identified. Thoracic aorta is normal in caliber and configuration. Heart size is normal. No pericardial effusion. No central obstructing pulmonary embolism seen. Upper abdominal findings described on yesterday's CT. IMPRESSION: 1. Extensive mediastinal lymphadenopathy, bilateral axillary lymphadenopathy and supraclavicular lymphadenopathy. 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 2. Right pleural effusion, small to moderate in size, with adjacent atelectasis. Additional smaller left pleural effusion. Electronically Signed   By: Franki Cabot M.D.   On: 01/21/2015 16:47   Ct Abdomen Pelvis W Contrast  01/20/2015  CLINICAL  DATA:  GI bleed with syncopal episodes. Massive duodenal bulb ulcer by EGD. Abnormal abdominal ultrasound demonstrating heterogeneous soft tissue prominence in the region of the porta hepatis pancreatic head. EXAM: CT ABDOMEN AND PELVIS WITH CONTRAST TECHNIQUE: Multidetector CT imaging of the abdomen and pelvis was performed using the standard protocol following bolus administration of intravenous contrast. CONTRAST:  164mL OMNIPAQUE IOHEXOL 300 MG/ML  SOLN COMPARISON:  Right upper quadrant abdominal ultrasound on 01/19/2015. FINDINGS: In the lower chest, soft tissue mass in the anterior lower mediastinum abuts the pericardium and measures roughly 4.0 x 7.7 cm in axial dimensions. This is most likely consistent with a lymph node mass. The superior images also show potentially the bottom margin of subcarinal lymphadenopathy. There are small bilateral pleural effusions, right greater than left. In abdomen, a large confluent mass is identified involving the distal stomach and proximal duodenum with visible ulceration. At the level of the proximal duodenum, thickening of the duodenal wall approaches 3.5 cm. Soft tissue mass extends into the porta hepatis and gastrohepatic region and likely tracks along portal triads into the liver. Soft tissue mass also abuts and obscures definition of the pancreatic head and proximal pancreatic body. Mass also surrounds the portal vein, portal confluence and abuts the superior mesenteric vein. There is associated retroperitoneal lymphadenopathy in the lower abdomen and pelvis. Extensive bilateral iliac chain lymphadenopathy present. Elongated external iliac lymph node masses bilaterally measure roughly 1.8 cm in short axis. Multiple mildly enlarged bilateral inguinal lymph nodes also present. 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 No evidence of bowel obstruction or free  intraperitoneal air. No focal  abscess is identified. The gallbladder, distal pancreas, spleen, adrenal glands and kidneys appear unremarkable. No bony lesions are seen. The bladder is decompressed. No hernias are identified. IMPRESSION: Large ulcerated mass involving the distal stomach and proximal duodenum and extending into the porta hepatis and gastrohepatic region. Associated lymphadenopathy in the lower anterior mediastinum, retroperitoneum and pelvis. Also suspected subcarinal lymphadenopathy is partially visualized. 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 Electronically Signed   By: Aletta Edouard M.D.   On: 01/20/2015 13:50   Dg Chest Portable 1 View  01/23/2015  CLINICAL DATA:  Assess orogastric tube positioning EXAM: PORTABLE CHEST 1 VIEW COMPARISON:  Portable chest x-ray of earlier today at 8:01 a.m. FINDINGS: The esophagogastric tube tip projects below the inferior margin of the image. There is no evidence of cord ongoing within the esophagus. The endotracheal tube tip lies 4.1 cm above the carina. The cardiac silhouette is mildly enlarged but stable. The central pulmonary vascularity is engorged especially on the right. There is mild to moderate pulmonary interstitial edema on the right. There is no pleural effusion or pneumothorax. The observed bony thorax exhibits no acute abnormality. External pacemaker -defibrillator pads. Are present IMPRESSION: 1. The esophagogastric tube tip projects below the inferior margin of the image with no evidence of coiling within the esophagus. The endotracheal tube is in appropriate position. 2. Persistently increased perihilar interstitial density especially on the right consistent with pulmonary interstitial edema. There may be pleural fluid on the right layering posteriorly contributing to the findings here. Electronically Signed   By: David  Martinique M.D.   On: 01/23/2015 09:22   Dg Chest Port 1 View  01/23/2015  CLINICAL DATA:  Status post intubation.  Intra  abdominal mass. EXAM: PORTABLE CHEST 1 VIEW COMPARISON:  CT chest 01/21/2015.  CT abdomen 01/21/2015. FINDINGS: The patient has a new endotracheal tube in place with the tip 3.5 cm above the carina. Hazy opacity over the right chest is consistent with layering pleural effusion. Smaller left pleural effusion is noted. No consolidative process or pneumothorax. Heart size is upper normal. IMPRESSION: ET tube in good position with the tip projecting 3.5 cm above the carina. Right greater than left pleural effusions. Electronically Signed   By: Inge Rise M.D.   On: 01/23/2015 08:19   US Abdomen Limited Ruq  01/19/2015  CLINICAL DATA:  Transaminitis. History of duodenal ulcer with adherent clot. EXAM: US ABDOMEN LIMITED - RIGHT UPPER QUADRANT COMPARISON:  None. FINDINGS: Gallbladder: No gallstones or wall thickening visualized. Minimal dependent sludge. No sonographic Murphy sign noted. Small amounts of pericholecystic free fluid. Common bile duct: Diameter: 4.2 mm. Liver: Ill-defined heterogeneous echogenicity in the region of the pancreatic head and porta hepatis, borders not well-defined. Mildly heterogeneous hepatic parenchyma. No intrahepatic biliary ductal dilatation. Normal directional flow in the main portal vein. Right pleural effusion, incidentally noted. IMPRESSION: 1. Heterogeneous echogenicity in the region of the porta hepatis and pancreatic head, difficult to separate from the adjacent liver parenchyma. This may reflect hematoma/blood products in the setting of bleeding duodenal ulcer versus pancreatic head or focal liver lesion. There is no biliary ductal dilatation. Recommend further characterization with cross-sectional imaging, contrast-enhanced CT or MRI. 2. Minimal sludge in the gallbladder, no wall thickening or gallstones. Small amount of pericholecystic fluid. No sonographic Murphy sign or findings of acute cholecystitis. Electronically Signed   By: Jeb Levering M.D.   On: 01/19/2015  20:48    Labs:  CBC:  Recent  Labs  01/21/15 0422 01/21/15 1617 01/22/15 0400 01/23/15 0751 01/23/15 0812  WBC 6.1 6.1 4.9 7.5  --   HGB 8.3* 8.6* 8.4* 6.3* 6.8*  HCT 25.0* 26.9* 25.9* 21.4* 20.0*  PLT 291 360 314 338  --     COAGS:  Recent Labs  01/17/15 1928 01/17/15 2119 01/18/15 0941 01/23/15 0751  INR 1.20  --  1.14 1.53*  APTT  --  31  --   --     BMP:  Recent Labs  01/19/15 0235 01/21/15 0422 01/22/15 0400 01/23/15 0751 01/23/15 0812  NA 138 138 138 147* 142  K 3.8 3.7 3.8 5.7* 5.5*  CL 110 108 106 111 113*  CO2 23 24 25  8*  --   GLUCOSE 84 108* 98 359* 328*  BUN 18 <5* <5* 11 13  CALCIUM 7.6* 7.9* 8.1* 9.2  --   CREATININE 0.97 1.03 0.94 1.71* 1.50*  GFRNONAA >60 >60 >60 41*  --   GFRAA >60 >60 >60 48*  --     LIVER FUNCTION TESTS:  Recent Labs  01/18/15 0941 01/19/15 0235 01/21/15 0422 01/23/15 0751  BILITOT 1.4* 0.8 0.5 0.2*  AST 42* 35 23 44*  ALT 84* 73* 50 37  ALKPHOS 73 62 64 67  PROT 5.2* 5.1* 5.3* 4.5*  ALBUMIN 2.6* 2.4* 2.5* 2.3*    TUMOR MARKERS:  Recent Labs  01/22/15 0400  AFPTM 7.3  CEA 0.7    Assessment and Plan: Patient with recent episodes of syncope, GI bleed, renal insufficiency and large ulcerated mass involving the distal stomach and proximal duodenum and extending into the porta hepatis and gastrohepatic region. In addition there is associated lymphadenopathy lower anterior mediastinum, retroperitoneum and pelvis. Findings concerning for lymphoma. Recent right inguinal lymph node biopsy pathology pending. Request now received for mesenteric/visceral arteriogram with possible embolization. Details/risks of procedure including but not limited to, internal bleeding, infection, nontarget embolization, need for emergent surgery, contrast nephropathy discussed with patient's wife with her understanding and consent. Procedure will be done ASAP.   Thank you for this interesting consult.  I greatly enjoyed meeting  [name] and look forward to participating in their care.  A copy of this report was sent to the requesting provider on this date.  Signed: D. Rowe Robert 01/23/2015, 9:24 AM   I spent a total of   20 minutes in face to face in clinical consultation, greater than 50% of which was counseling/coordinating care for mesenteric/visceral arteriogram with possible embolization

## 2015-01-23 NOTE — Sedation Documentation (Signed)
Family updated as to patient's status. Pt resting. Vitals stable at this time, MD at bedside

## 2015-01-23 NOTE — Progress Notes (Signed)
Left 30f sheath remove exoseal used 31min manual pressure no complications.  Crysral RN checked groin before we dressed in a sterile fashion.

## 2015-01-23 NOTE — Progress Notes (Signed)
Utilization review completed. Kyndel Egger, RN, BSN. 
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
## 2015-01-23 NOTE — Procedures (Signed)
Interventional Radiology Procedure Note  Procedure: 1.) Visceral angiogram shows no active hemorrhage but there is a hypervascular tumor supplied by the GDA.  2.) Coil embolization of the GDA 3.) Placement of a left femoral vein triple lumen CVC.  Tip at the iliocaval junction and ready for use.   Arterial Access: LEFT common femoral, 48F sheath left in place to serve as arterial line for BP monitoring.   Complications: None  Estimated Blood Loss: <25 mL  Recommendations: 1.) Left arterial sheath care 2.) Left femoral CVC care 3.)Trend H&H, transfuse as sneeded  Signed,  Criselda Peaches, MD

## 2015-01-23 NOTE — Sedation Documentation (Signed)
Received report from Abigail Butts, ED RN. She states she will give report to 2S01 RN. Transferred pt to interventional radiology for procedure. Vitals stable at this time

## 2015-01-23 NOTE — ED Notes (Signed)
Per Sabra Heck MD pt to have CBC drawn post blood transfusion, phlebotomy aware & collected blood per MD order

## 2015-01-23 NOTE — Progress Notes (Signed)
RT note- transported to IR on current settings.

## 2015-01-23 NOTE — Progress Notes (Signed)
   01/23/15 0917  Clinical Encounter Type  Visited With Family;Patient;Health care provider  Visit Type Initial;Follow-up   Chaplain responded to a previous chaplain's request to follow-up with patient and patient's family. Chaplain met with patient's wife and neighbors and helped them transition to patient's room. Chaplain support available as needed.   Jeri Lager, Chaplain 01/23/2015 9:18 AM

## 2015-01-23 NOTE — Sedation Documentation (Signed)
Patient is resting

## 2015-01-23 NOTE — Telephone Encounter (Signed)
Patient scheduled for 12/23 @ 2:30 w/Dr. Jana Hakim.

## 2015-01-23 NOTE — H&P (Signed)
PULMONARY / CRITICAL CARE MEDICINE   Name: [name] MRN: [id number] DOB: 1950-10-15    ADMISSION DATE:  01/23/2015  REFERRING MD:  Dr. Sabra Heck  CHIEF COMPLAINT:  Syncope, acute respiratory failure  HISTORY OF PRESENT ILLNESS:   This is a 64 yo AA male with a h/o hypertension, multiple syncopal episodes, and hyperlipidemia who presented to the ED via EMS following a syncopal episode with severe SOB at home. History is obtained from ED records and from patient's wife. Wife indicates that patient had an uneventful night after his lymph node biopsy that was done on 12/19. Per patient's wife, patient complained of abdominal fullness and nausea. He went to the bathroom, felt extremely dizzy, sob and fell on the floor. She assisted him out of the bathroom. EMS was called and upon EMS arrival, patient was very SOB and combative. He was placed on a non-rebreather mask and transferred to the ED. At the ED, patient decompensated requiring intubation. His NGT is draining bright red blood. 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 He had an EGD on 12/15 and a CT abdomen on 12/14 that showed a massive duodenal bulb ulcer with adherent clot, an ulcerated pyloric channel and lymphadenopathy with great suspicion for lymphoma. Pathology results are pending. He was recently in the ED(12/14) and was diagnosed with severe anemia with a hemoglobin of 4.5. He is being admitted to the ICU for airway management and management of hypovolemic shock.   PAST MEDICAL HISTORY :  He  has a past medical history of Essential hypertension and HLD (hyperlipidemia).  PAST SURGICAL HISTORY: He  has past surgical history that includes Skin surgery and Esophagogastroduodenoscopy (Left, 01/18/2015).  No Known Allergies  No current facility-administered medications on file prior to encounter.   Current Outpatient Prescriptions on File Prior to Encounter   Medication Sig  . omeprazole (PRILOSEC) 40 MG capsule Take 1 capsule (40 mg total) by mouth 2 (two) times daily.    FAMILY HISTORY:  His has no family status information on file.   SOCIAL HISTORY: He  reports that he has never smoked. He does not have any smokeless tobacco history on file. He reports that he does not drink alcohol or use illicit drugs.  REVIEW OF SYSTEMS:   Unable to obtain  SUBJECTIVE:  Intubated and sedated  VITAL SIGNS: BP 165/109 mmHg  Pulse 125  Temp(Src) 95 F (35 C) (Temporal)  Resp 20  Ht 5\' 11"  (1.803 m)  SpO2 100%  HEMODYNAMICS:    VENTILATOR SETTINGS: Vent Mode:  [-] PRVC FiO2 (%):  [100 %] 100 % Set Rate:  [18 bmp-20 bmp] 20 bmp Vt Set:  [600 mL] 600 mL PEEP:  [5 cmH20-8 cmH20] 8 cmH20 Plateau Pressure:  [17 cmH20] 17 cmH20  INTAKE / OUTPUT:    PHYSICAL EXAMINATION: General: Well developed and well nourished; sedated Neuro: Unresponsive to voice, touch and noxious stimulus HEENT: PERRLA, NGT with bright red blood, oral cavity with bloody secretions Cardiovascular: Tachycardiac, S1/S2, no MRG Lungs: Diminished breath sounds, scattered rhonchi, no wheezing Abdomen: Distended, hypoactive bowel sounds Musculoskeletal: No joint deformities Skin: Cold, do caynosis  LABS:  BMET  Recent Labs Lab 01/21/15 0422 01/22/15 0400 01/23/15 0751 01/23/15 0812  NA 138 138 147* 142  K 3.7 3.8 5.7* 5.5*  CL 108 106 111 113*  CO2 24 25 8*  --   BUN <5* <5* 11 13  CREATININE 1.03 0.94 1.71*  1.50*  GLUCOSE 108* 98 359* 328*    Electrolytes  Recent Labs Lab 01/21/15 0422 01/22/15 0400 01/23/15 0751  CALCIUM 7.9* 8.1* 9.2    CBC  Recent Labs Lab 01/21/15 1617 01/22/15 0400 01/23/15 0751 01/23/15 0812  WBC 6.1 4.9 7.5  --   HGB 8.6* 8.4* 6.3* 6.8*  HCT 26.9* 25.9* 21.4* 20.0*  PLT 360 314 338  --     Coag's  Recent Labs Lab 01/17/15 1928 01/17/15 2119 01/18/15 0941 01/23/15 0751  APTT  --  31  --   --   INR 1.20   --  1.14 1.53*    Sepsis Markers No results for input(s): LATICACIDVEN, PROCALCITON, O2SATVEN in the last 168 hours.  ABG  Recent Labs Lab 01/23/15 0832  PHART 6.624*  PCO2ART 79.6*  PO2ART 70.0*    Liver Enzymes  Recent Labs Lab 01/19/15 0235 01/21/15 0422 01/23/15 0751  AST 35 23 44*  ALT 73* 50 37  ALKPHOS 62 64 67  BILITOT 0.8 0.5 0.2*  ALBUMIN 2.4* 2.5* 2.3*    Cardiac Enzymes No results for input(s): TROPONINI, PROBNP in the last 168 hours.  Glucose  Recent Labs Lab 01/18/15 0759 01/19/15 0806 01/20/15 0729 01/23/15 0816  GLUCAP 91 71 81 173*    Imaging Dg Chest Portable 1 View  01/23/2015  CLINICAL DATA:  Assess orogastric tube positioning EXAM: PORTABLE CHEST 1 VIEW COMPARISON:  Portable chest x-ray of earlier today at 8:01 a.m. FINDINGS: The esophagogastric tube tip projects below the inferior margin of the image. There is no evidence of cord ongoing within the esophagus. The endotracheal tube tip lies 4.1 cm above the carina. The cardiac silhouette is mildly enlarged but stable. The central pulmonary vascularity is engorged especially on the right. There is mild to moderate pulmonary interstitial edema on the right. There is no pleural effusion or pneumothorax. The observed bony thorax exhibits no acute abnormality. External pacemaker -defibrillator pads. Are present IMPRESSION: 1. The esophagogastric tube tip projects below the inferior margin of the image with no evidence of coiling within the esophagus. The endotracheal tube is in appropriate position. 2. Persistently increased perihilar interstitial density especially on the right consistent with pulmonary interstitial edema. There may be pleural fluid on the right layering posteriorly contributing to the findings here. Electronically Signed   By: David  Martinique M.D.   On: 01/23/2015 09:22   Dg Chest Port 1 View  01/23/2015  CLINICAL DATA:  Status post intubation.  Intra abdominal mass. EXAM: PORTABLE  CHEST 1 VIEW COMPARISON:  CT chest 01/21/2015.  CT abdomen 01/21/2015. FINDINGS: The patient has a new endotracheal tube in place with the tip 3.5 cm above the carina. Hazy opacity over the right chest is consistent with layering pleural effusion. Smaller left pleural effusion is noted. No consolidative process or pneumothorax. Heart size is upper normal. IMPRESSION: ET tube in good position with the tip projecting 3.5 cm above the carina. Right greater than left pleural effusions. Electronically Signed   By: Inge Rise M.D.   On: 01/23/2015 08:19     STUDIES:  IR for visceral angiogram 12/20>> CT head without contrast 12/20>> CXR 12/20 CULTURES: Blood, urine and sputum>>12/20  ANTIBIOTICS: Zosyn  SIGNIFICANT EVENTS: 12/14: ED with syncope, dizziness, black tools and was found to be profoundly anemic. CT and EGD done; large duodenal mass and suspicion for lymphoma  12/19: Right inguinal lymph node biopsy 12/20: Syncope and ED with extensive GI bleed  LINES/TUBES: PIVs ETT  NGT  DISCUSSION: 64 YO AA male with newly diagnosed duodenal mass, suspected lymphoma and severe blood loss anemia presenting with hypovolemic shock s/p right inguinal lymph node biopsy and acute respiratory failure necessitating intubation.   ASSESSMENT / PLAN:  PULMONARY A: Acute respiratory failure High risk for aspiration penumonitis P:   Full vent support VAP prevention CXR Empiric antibiotics F/u cultures Bronchodilators  CARDIOVASCULAR A:  Hypovolemic shock secondary to acute blood loss Uncontrolled hypertension  P:  Blood transfusion FFP CBC Q6 Hold all antihypertensives  RENAL A:   AKI secondary to volume depletion ; baseline creatinine 0.93; 12/20 1.71 P:    IV fluids Trend creatinine  GASTROINTESTINAL A:   Large duodenal mass Acute GI bleed P:   GI Surgery and IR following Protonix gtt Octreotide gtt IV fluids Transfuse PRBCs and FFP  Strict NPO  HEMATOLOGIC A:    Acute blood loss anemia 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 Updated on treatment plan  - Inter-disciplinary family meet or Palliative Care meeting due by: day 7   Critical care time spent examining patient, establishing treatment plan, managing vent, reviewing history and labs, CXR, and ABG interpretation is 60 minutes  Magdalene [facility] ANP-BC Pulmonary and Critical Care Medicine [facility] Pager: [phone]  01/23/2015, 9:51 AM

## 2015-01-23 NOTE — Consult Note (Signed)
Reason for Consult:  Acute GI bleed Referring Physician: DR. Hazle Nordmann (ED)  [name] is an 64 y.o. male.  HPI: Pt admitted 01/17/15 with syncope, generalized weakness, and dizzy.  He reported dark stools.  Work up showed a GI bleed with a hemoglobin of 4.5.  He was transfused with 4 units of PRBC.  EGD showed: Massive Duodenal Bulb Ulcer with adherent clot - high risk for rebleeding.  Ulcerated pyloric channel.   They recommended IR for embolization if he rebleeds.  CT of the abdomen on 01/20/15 revealed large mass involving distal; stomach and duodenum, suggestive of lymphoma.  He stabilized and was seen by our service on 01/20/15 for surgical tissue biopsy of his lymph node.  He was stable over the weekend and underwent tissue biopsy yesterday, right groin lymph node by Dr. Georgette Dover.  He was discharged home later that evening by Medicine.   EMS was called this AM with a witnessed fall.  He did not hit his head, no LOC.  He was combative and had to be restrained to be transported.  On arrival he was unable to speak and combative, he was immobilized and intubated for his respiratory distress on arrival.  During the intubation he vomited a large volume of blood from his stomach. PH 6.62, H/H 6.8/20.  We are called to see and assist with care.  GI has recommended IR emobolization, CCM is being consulted to admit.    Past Medical History  Diagnosis Date  . Essential hypertension   . HLD (hyperlipidemia)     Past Surgical History  Procedure Laterality Date  . Skin surgery      Small benign cysts over left scalp removed  . Esophagogastroduodenoscopy Left 01/18/2015    Procedure: ESOPHAGOGASTRODUODENOSCOPY (EGD);  Surgeon: Wilford Corner, MD;  Location: [facility] ENDOSCOPY;  Service: Endoscopy;  Laterality: Left;    Family History  Problem Relation Age of Onset  . Hypertension Mother   . Hypertension Father   . Hypertension Sister   . Diabetes Sister   . Prostate cancer Brother   . Lupus Sister    . Kidney failure Father     Social History:  reports that he has never smoked. He does not have any smokeless tobacco history on file. He reports that he does not drink alcohol or use illicit drugs.  Allergies: No Known Allergies  Prior to Admission medications   Medication Sig Start Date End Date Taking? Authorizing Provider  omeprazole (PRILOSEC) 40 MG capsule Take 1 capsule (40 mg total) by mouth 2 (two) times daily. 01/22/15   Bonnielee Haff, MD     Results for orders placed or performed during the hospital encounter of 01/23/15 (from the past 48 hour(s))  CBC     Status: Abnormal   Collection Time: 01/23/15  7:51 AM  Result Value Ref Range   WBC 7.5 4.0 - 10.5 K/uL   RBC 2.28 (L) 4.22 - 5.81 MIL/uL   Hemoglobin 6.3 (LL) 13.0 - 17.0 g/dL    Comment: REPEATED TO VERIFY CRITICAL RESULT CALLED TO, READ BACK BY AND VERIFIED WITH: W.CHILDRESS,RN 01/23/15 0822 CLARK,S    HCT 21.4 (L) 39.0 - 52.0 %   MCV 93.9 78.0 - 100.0 fL    Comment: DELTA CHECK NOTED RESULT CALLED TO, READ BACK BY AND VERIFIED WITH: W.CHILDRESS,RN 01/23/15 0822 CLARK,S    MCH 27.6 26.0 - 34.0 pg   MCHC 29.4 (L) 30.0 - 36.0 g/dL   RDW 16.6 (H) 11.5 - 15.5 %  Platelets 338 150 - 400 K/uL  Type and screen     Status: None (Preliminary result)   Collection Time: 01/23/15  8:00 AM  Result Value Ref Range   ABO/RH(D) AB POS    Antibody Screen PENDING    Sample Expiration 01/26/2015    Unit Number [id number]    Blood Component Type RED CELLS,LR    Unit division 00    Status of Unit ISSUED    Unit tag comment VERBAL ORDERS PER DR MILLER    Transfusion Status OK TO TRANSFUSE    Crossmatch Result PENDING    Unit Number [id number]    Blood Component Type RED CELLS,LR    Unit division 00    Status of Unit ISSUED    Unit tag comment VERBAL ORDERS PER DR MILLER    Transfusion Status OK TO TRANSFUSE    Crossmatch Result PENDING   Prepare RBC     Status: None   Collection Time: 01/23/15  8:00 AM   Result Value Ref Range   Order Confirmation ORDER PROCESSED BY BLOOD BANK   I-Stat Chem 8, ED     Status: Abnormal   Collection Time: 01/23/15  8:12 AM  Result Value Ref Range   Sodium 142 135 - 145 mmol/L   Potassium 5.5 (H) 3.5 - 5.1 mmol/L   Chloride 113 (H) 101 - 111 mmol/L   BUN 13 6 - 20 mg/dL   Creatinine, Ser 1.50 (H) 0.61 - 1.24 mg/dL   Glucose, Bld 328 (H) 65 - 99 mg/dL   Calcium, Ion 1.15 1.13 - 1.30 mmol/L   TCO2 11 0 - 100 mmol/L   Hemoglobin 6.8 (LL) 13.0 - 17.0 g/dL   HCT 20.0 (L) 39.0 - 52.0 %   Comment NOTIFIED PHYSICIAN   I-Stat arterial blood gas, ED     Status: Abnormal   Collection Time: 01/23/15  8:32 AM  Result Value Ref Range   pH, Arterial 6.624 (LL) 7.350 - 7.450   pCO2 arterial 79.6 (HH) 35.0 - 45.0 mmHg   pO2, Arterial 70.0 (L) 80.0 - 100.0 mmHg   Bicarbonate 8.2 (L) 20.0 - 24.0 mEq/L   TCO2 11 0 - 100 mmol/L   O2 Saturation 60.0 %   Acid-base deficit 26.0 (H) 0.0 - 2.0 mmol/L   Patient temperature 37.0 C    Collection site RADIAL, ALLEN'S TEST ACCEPTABLE    Drawn by RT    Sample type ARTERIAL    Comment NOTIFIED PHYSICIAN     Ct Chest W Contrast  01/21/2015  CLINICAL DATA:  Mediastinal mass. EXAM: CT CHEST WITH CONTRAST TECHNIQUE: Multidetector CT imaging of the chest was performed during intravenous contrast administration. CONTRAST:  73mL OMNIPAQUE IOHEXOL 300 MG/ML  SOLN COMPARISON:  CT abdomen and pelvis dated 01/20/2015. FINDINGS: On yesterday's CT of the abdomen and pelvis, a large ulcerated mass involving the distal stomach and proximal duodenum was identified, with extension to the porta hepatis and gastrohepatic region, suggesting lymphoma. Associated lymphadenopathy was identified in the lower anterior mediastinum, retroperitoneum and pelvis, further suggesting lymphoma. Additional lymphadenopathy was suspected in the sub-carinal region of the mediastinum, incompletely imaged. On today's study, conglomerate subcarinal lymphadenopathy is  confirmed, measuring 7.5 x 2.8 cm on image 33 of series 201. This conglomerate lymphadenopathy extends upwards within the mediastinum encasing the mid and lower portions of the trachea, without effacement of the trachea. At the level of the lower trachea, the conglomerate lymphadenopathy measures approximately 5.9 x 3.9 cm. Additional lymphadenopathy within the anterior  mediastinum measures 4.4 x 2.3 cm (image 17, series 201). Additional smaller lymph nodes scattered throughout the mediastinum and bilateral perihilar regions. Numerous small and moderately enlarged lymph nodes are seen within each axillary region. Clustered small and enlarged lymph nodes are seen within the supraclavicular regions bilaterally. There is a right pleural effusion, small to moderate in size, with adjacent compressive atelectasis. There is also a small left pleural effusion with adjacent atelectasis. Very mild degenerative change noted within the cervical and thoracic spine. No acute osseous abnormality identified. Thoracic aorta is normal in caliber and configuration. Heart size is normal. No pericardial effusion. No central obstructing pulmonary embolism seen. Upper abdominal findings described on yesterday's CT. IMPRESSION: 1. Extensive mediastinal lymphadenopathy, bilateral axillary lymphadenopathy and supraclavicular lymphadenopathy. 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 2. Right pleural effusion, small to moderate in size, with adjacent atelectasis. Additional smaller left pleural effusion. Electronically Signed   By: Franki Cabot M.D.   On: 01/21/2015 16:47   Dg Chest Port 1 View  01/23/2015  CLINICAL DATA:  Status post intubation.  Intra abdominal mass. EXAM: PORTABLE CHEST 1 VIEW COMPARISON:  CT chest 01/21/2015.  CT abdomen 01/21/2015. FINDINGS: The patient has a new endotracheal tube in place with the tip 3.5 cm above the carina.  Hazy opacity over the right chest is consistent with layering pleural effusion. Smaller left pleural effusion is noted. No consolidative process or pneumothorax. Heart size is upper normal. IMPRESSION: ET tube in good position with the tip projecting 3.5 cm above the carina. Right greater than left pleural effusions. Electronically Signed   By: Inge Rise M.D.   On: 01/23/2015 08:19    Review of Systems  Unable to perform ROS: intubated   Blood pressure 94/59, pulse 103, temperature 96.4 F (35.8 C), temperature source Temporal, resp. rate 18, height 5\' 11"  (1.803 m), SpO2 100 %. Physical Exam  Constitutional: He appears well-developed and well-nourished.  Hypotensive, but better with rapid transfusion, he has had one unit and getting second now. Intubated and not responsive  HENT:  Head: Normocephalic.  Intubated and trying to get NG in now.  Eyes: Right eye exhibits no discharge. Left eye exhibits no discharge. No scleral icterus.  Neck:  In a collar  Cardiovascular: Normal heart sounds and intact distal pulses.   tachycardia  Respiratory: He is in respiratory distress (on Vent). He has no wheezes. He has no rales. He exhibits no tenderness.  Full vent support, breath sounds clear anterior  GI: Soft. He exhibits no distension.  Sedated and not responding to any verbal/physical  Stimulus.  Musculoskeletal: He exhibits no edema.  Skin: Skin is dry. No rash noted. No erythema. No pallor.  Psychiatric:  Intubated and sedated    Assessment/Plan: Recurrent GI bleed Acute respiratory failure Acute renal failure Abdominal mass/possible lymphoma, S/P right lymph node biopsy yesterday. Hypertension    Plan:  Agree with IR embolization.  He has been seen by Dr. Rosendo Gros and Dr. Georgette Dover is aware.  We will follow with you.    Meliah Appleman 01/23/2015, 8:36 AM

## 2015-01-24 ENCOUNTER — Inpatient Hospital Stay (HOSPITAL_COMMUNITY): Payer: BLUE CROSS/BLUE SHIELD

## 2015-01-24 ENCOUNTER — Other Ambulatory Visit: Payer: Self-pay | Admitting: Oncology

## 2015-01-24 DIAGNOSIS — R571 Hypovolemic shock: Secondary | ICD-10-CM

## 2015-01-24 LAB — COMPREHENSIVE METABOLIC PANEL
ALK PHOS: 61 U/L (ref 38–126)
ALT: 37 U/L (ref 17–63)
AST: 39 U/L (ref 15–41)
Albumin: 2 g/dL — ABNORMAL LOW (ref 3.5–5.0)
Anion gap: 6 (ref 5–15)
BUN: 21 mg/dL — ABNORMAL HIGH (ref 6–20)
CALCIUM: 6.7 mg/dL — AB (ref 8.9–10.3)
CO2: 21 mmol/L — ABNORMAL LOW (ref 22–32)
CREATININE: 1.65 mg/dL — AB (ref 0.61–1.24)
Chloride: 114 mmol/L — ABNORMAL HIGH (ref 101–111)
GFR, EST AFRICAN AMERICAN: 50 mL/min — AB (ref >60)
GFR, EST NON AFRICAN AMERICAN: 43 mL/min — AB (ref >60)
Glucose, Bld: 142 mg/dL — ABNORMAL HIGH (ref 65–99)
Potassium: 4.3 mmol/L (ref 3.5–5.1)
Sodium: 141 mmol/L (ref 135–145)
TOTAL PROTEIN: 4.1 g/dL — AB (ref 6.5–8.1)
Total Bilirubin: 0.5 mg/dL (ref 0.3–1.2)

## 2015-01-24 LAB — PREPARE FRESH FROZEN PLASMA
UNIT DIVISION: 0
UNIT DIVISION: 0

## 2015-01-24 LAB — GLUCOSE, CAPILLARY
GLUCOSE-CAPILLARY: 119 mg/dL — AB (ref 65–99)
GLUCOSE-CAPILLARY: 113 mg/dL — AB (ref 65–99)
Glucose-Capillary: 116 mg/dL — ABNORMAL HIGH (ref 65–99)
Glucose-Capillary: 116 mg/dL — ABNORMAL HIGH (ref 65–99)
Glucose-Capillary: 124 mg/dL — ABNORMAL HIGH (ref 65–99)

## 2015-01-24 LAB — URINE CULTURE
Culture: NO GROWTH
Special Requests: NORMAL

## 2015-01-24 LAB — CBC
HCT: 22.1 % — ABNORMAL LOW (ref 39.0–52.0)
HEMOGLOBIN: 7.5 g/dL — AB (ref 13.0–17.0)
MCH: 28.5 pg (ref 26.0–34.0)
MCHC: 33.9 g/dL (ref 30.0–36.0)
MCV: 84 fL (ref 78.0–100.0)
Platelets: 207 10*3/uL (ref 150–400)
RBC: 2.63 MIL/uL — AB (ref 4.22–5.81)
RDW: 16.8 % — ABNORMAL HIGH (ref 11.5–15.5)
WBC: 14.5 10*3/uL — ABNORMAL HIGH (ref 4.0–10.5)

## 2015-01-24 LAB — PHOSPHORUS: PHOSPHORUS: 4.6 mg/dL (ref 2.5–4.6)

## 2015-01-24 LAB — MAGNESIUM: MAGNESIUM: 1.8 mg/dL (ref 1.7–2.4)

## 2015-01-24 IMAGING — DX DG CHEST 1V PORT
1 series · 1 of 1 positions shown · non-contrast
Comparison: [DATE]

CLINICAL DATA: Respiratory failure.

EXAM:
PORTABLE CHEST 1 VIEW

[chest ap]
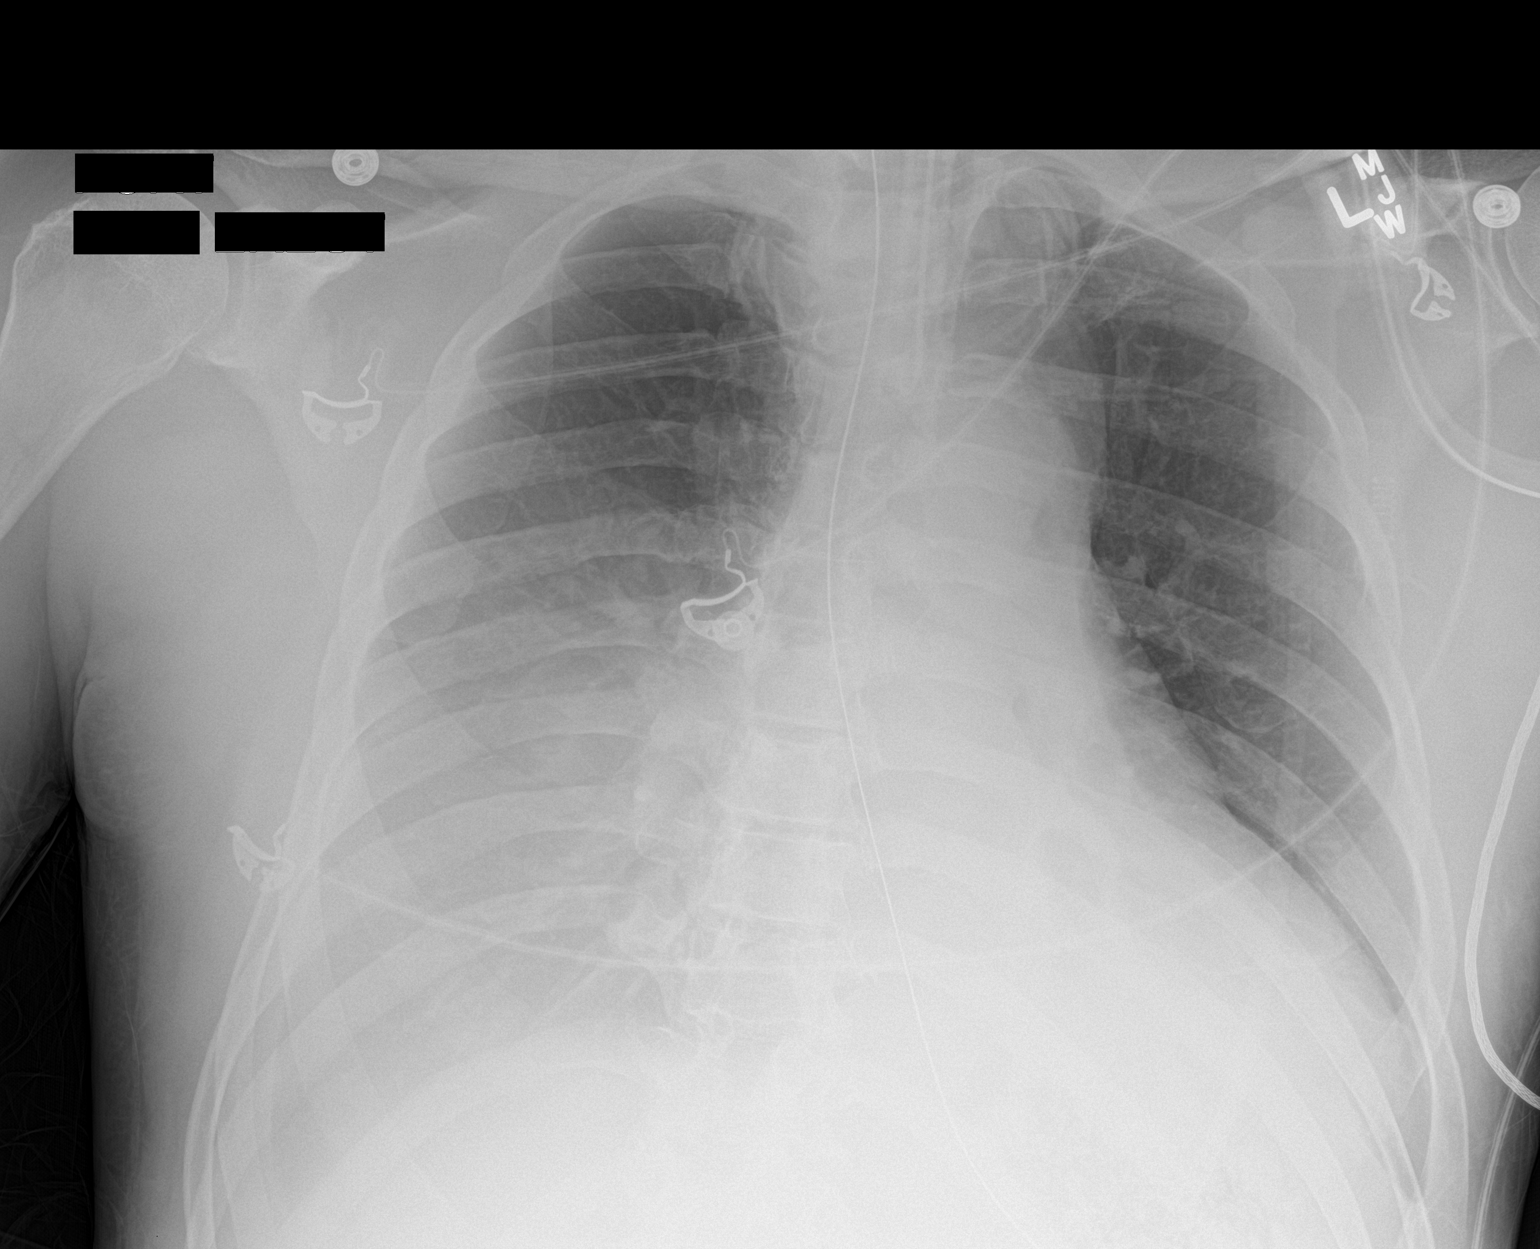

[1 of 1 positions shown; findings below may reference images not displayed]

FINDINGS: Endotracheal tube and NG tube in stable position. Cardiomegaly with
normal pulmonary vascularity.Persistent increased density noted over
the right Lower lung, most likely layering pleural effusion.
Underlying pulmonary infiltrate cannot be excluded. Small left
pleural effusion cannot be excluded. Low lung volumes basilar
atelectasis. No pneumothorax.
IMPRESSION: 1. Lines and tubes in stable position.

2. Persistent increased density of the right lower lung, most likely
layering pleural effusion. Underlying infiltrate cannot be excluded.

3. Low lung volumes with basilar atelectasis.

## 2015-01-24 MED ORDER — MIDAZOLAM HCL 2 MG/2ML IJ SOLN
INTRAMUSCULAR | Status: AC
  Administered 2015-01-24: 1 mg
  Filled 2015-01-24: qty 2

## 2015-01-24 MED ORDER — SODIUM CHLORIDE 0.9 % IV BOLUS (SEPSIS)
1000.0000 mL | Freq: Once | INTRAVENOUS | Status: AC
  Administered 2015-01-24: 1000 mL via INTRAVENOUS

## 2015-01-24 MED ORDER — INFLUENZA VAC SPLIT QUAD 0.5 ML IM SUSY
0.5000 mL | PREFILLED_SYRINGE | INTRAMUSCULAR | Status: AC
  Administered 2015-01-25: 0.5 mL via INTRAMUSCULAR
  Filled 2015-01-24: qty 0.5

## 2015-01-24 MED ORDER — SODIUM CHLORIDE 0.9 % IV SOLN
INTRAVENOUS | Status: DC
  Administered 2015-01-24: 02:00:00 via INTRAVENOUS
  Administered 2015-01-24: 75 mL/h via INTRAVENOUS
  Administered 2015-01-25 – 2015-01-26 (×3): via INTRAVENOUS

## 2015-01-24 MED ORDER — MAGNESIUM SULFATE IN D5W 10-5 MG/ML-% IV SOLN
1.0000 g | Freq: Once | INTRAVENOUS | Status: AC
  Administered 2015-01-24: 1 g via INTRAVENOUS
  Filled 2015-01-24: qty 100

## 2015-01-24 NOTE — H&P (Signed)
PULMONARY / CRITICAL CARE MEDICINE   Name: [name] MRN: [id number] DOB: 1950/05/06    ADMISSION DATE:  01/23/2015  REFERRING MD:  Dr. Sabra Heck  CHIEF COMPLAINT:  Syncope, acute respiratory failure BRIEF  This is a 64 yo AA male with a h/o hypertension, multiple syncopal episodes, and hyperlipidemia who presented to the ED via EMS following a syncopal episode with severe SOB at home. History is obtained from ED records and from patient's wife. Wife indicates that patient had an uneventful night after his lymph node biopsy that was done on 12/19. Per patient's wife, patient complained of abdominal fullness and nausea. He went to the bathroom, felt extremely dizzy, sob and fell on the floor. She assisted him out of the bathroom. EMS was called and upon EMS arrival, patient was very SOB and combative. He was placed on a non-rebreather mask and transferred to the ED. At the ED, patient decompensated requiring intubation. His NGT is draining bright red blood. 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 He had an EGD on 12/15 and a CT abdomen on 12/14 that showed a massive duodenal bulb ulcer with adherent clot, an ulcerated pyloric channel and lymphadenopathy with great suspicion for lymphoma. Pathology results are pending. He was recently in the ED(12/14) and was diagnosed with severe anemia with a hemoglobin of 4.5. He is being admitted to the ICU for airway management and management of hypovolemic shock.   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 GI wants NG tube and PPI gtt an doctreotide gtt continuing. Meets extubation criteria and extubated  VITAL SIGNS: BP 151/87 mmHg  Pulse 107  Temp(Src) 97.9 F (36.6 C) (Axillary)  Resp 22  Ht 5\' 11"  (1.803 m)  Wt 99 kg (218 lb 4.1 oz)  BMI 30.45 kg/m2  SpO2 98%  HEMODYNAMICS:    VENTILATOR SETTINGS: Vent Mode:  [-] PSV;CPAP FiO2 (%):  [40 %-100 %] 40 % Set Rate:   [20 bmp] 20 bmp Vt Set:  [600 mL] 600 mL PEEP:  [8 cmH20] 8 cmH20 Pressure Support:  [10 cmH20] 10 cmH20 Plateau Pressure:  [20 cmH20-21 cmH20] 20 cmH20  INTAKE / OUTPUT: I/O last 3 completed shifts: In: 5751.3 [I.V.:4481.3; NG/GT:120; IV Piggyback:1150] Out: 1910 [Urine:1810; Emesis/NG output:100]  PHYSICAL EXAMINATION: General: Well developed and well nourished;  Neuro: RASS 0. CAM-ICU negative for delirium. Moves all 4s. No neck spine tenderness (off sedation) HEENT: PERRLA, NGT without active blood Cardiovascular: Tachycardiac, S1/S2, no MRG Lungs: Diminished breath sound, no wheezing. Sync with PSV and later well on RA Abdomen: Distended, hypoactive bowel sounds Musculoskeletal: No joint deformities Skin: Cold, do caynosis  LABS:  BMET  Recent Labs Lab 01/22/15 0400 01/23/15 0751 01/23/15 0812 01/24/15 0430  NA 138 147* 142 141  K 3.8 5.7* 5.5* 4.3  CL 106 111 113* 114*  CO2 25 8*  --  21*  BUN <5* 11 13 21*  CREATININE 0.94 1.71* 1.50* 1.65*  GLUCOSE 98 359* 328* 142*    Electrolytes  Recent Labs Lab 01/22/15 0400 01/23/15 0751 01/24/15 0430  CALCIUM 8.1* 9.2 6.7*  MG  --   --  1.8  PHOS  --   --  4.6    CBC  Recent Labs Lab 01/23/15 1710 01/23/15 2230 01/24/15 0430  WBC 19.2* 17.0* 14.5*  HGB 8.6* 8.0* 7.5*  HCT 25.5* 23.7* 22.1*  PLT 224 216 207    Coag's  Recent Labs Lab  01/17/15 1928 01/17/15 2119 01/18/15 0941 01/23/15 0751  APTT  --  31  --   --   INR 1.20  --  1.14 1.53*    Sepsis Markers  Recent Labs Lab 01/23/15 1400  LATICACIDVEN 2.0    ABG  Recent Labs Lab 01/23/15 0832  PHART 6.624*  PCO2ART 79.6*  PO2ART 70.0*    Liver Enzymes  Recent Labs Lab 01/21/15 0422 01/23/15 0751 01/24/15 0430  AST 23 44* 39  ALT 50 37 37  ALKPHOS 64 67 61  BILITOT 0.5 0.2* 0.5  ALBUMIN 2.5* 2.3* 2.0*    Cardiac Enzymes No results for input(s): TROPONINI, PROBNP in the last 168 hours.  Glucose  Recent Labs Lab  01/23/15 1331 01/23/15 1557 01/23/15 1909 01/23/15 2339 01/24/15 0349 01/24/15 0733  GLUCAP 155* 133* 135* 124* 116* 119*    Imaging Ct Head Wo Contrast  01/23/2015  CLINICAL DATA:  64 year old male with fall. EXAM: CT HEAD WITHOUT CONTRAST CT CERVICAL SPINE WITHOUT CONTRAST TECHNIQUE: Multidetector CT imaging of the head and cervical spine was performed following the standard protocol without intravenous contrast. Multiplanar CT image reconstructions of the cervical spine were also generated. COMPARISON:  None. FINDINGS: CT HEAD FINDINGS The ventricles and sulci are appropriate in size for patient's age. Mild periventricular and deep white matter hypodensities represent chronic microvascular ischemic changes. There is no intracranial hemorrhage. No mass effect or midline shift identified. An endotracheal and enteric tube are partially visualized. There is diffuse mucoperiosteal thickening of the paranasal sinuses with partial opacification of the ethmoid air cells. The mastoid air cells are well aerated. Right occipital scalp hematoma. The calvarium is intact. CT CERVICAL SPINE FINDINGS There is no acute fracture or subluxation of the cervical spine.Mild multilevel degenerative changes.The odontoid and spinous processes are intact.There is normal anatomic alignment of the C1-C2 lateral masses. The visualized soft tissues appear unremarkable. An enteric tube and an endotracheal tube are partially visualized. Partially visualized bilateral pleural effusions, right greater than left. There is bilateral supraclavicular and right cervical adenopathy. Upper mediastinal lymphadenopathy noted. Chest CT is recommended for further evaluation. Right supraclavicular soft tissue hemorrhage noted. IMPRESSION: No acute intracranial hemorrhage. 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 No acute/traumatic cervical spine pathology. Bilateral supraclavicular and right cervical as well as  mediastinal adenopathy. Further evaluation recommended. Electronically Signed   By: Anner Crete M.D.   On: 01/23/2015 18:27   Ct Cervical Spine Wo Contrast  01/23/2015  CLINICAL DATA:  64 year old male with fall. EXAM: CT HEAD WITHOUT CONTRAST CT CERVICAL SPINE WITHOUT CONTRAST TECHNIQUE: Multidetector CT imaging of the head and cervical spine was performed following the standard protocol without intravenous contrast. Multiplanar CT image reconstructions of the cervical spine were also generated. COMPARISON:  None. FINDINGS: CT HEAD FINDINGS The ventricles and sulci are appropriate in size for patient's age. Mild periventricular and deep white matter hypodensities represent chronic microvascular ischemic changes. There is no intracranial hemorrhage. No mass effect or midline shift identified. An endotracheal and enteric tube are partially visualized. There is diffuse mucoperiosteal thickening of the paranasal sinuses with partial opacification of the ethmoid air cells. The mastoid air cells are well aerated. Right occipital scalp hematoma. The calvarium is intact. CT CERVICAL SPINE FINDINGS There is no acute fracture or subluxation of the cervical spine.Mild multilevel degenerative changes.The odontoid and spinous processes are intact.There is normal anatomic alignment of the C1-C2 lateral masses. The visualized soft tissues appear unremarkable. An enteric tube and an endotracheal tube are partially  visualized. Partially visualized bilateral pleural effusions, right greater than left. There is bilateral supraclavicular and right cervical adenopathy. Upper mediastinal lymphadenopathy noted. Chest CT is recommended for further evaluation. Right supraclavicular soft tissue hemorrhage noted. IMPRESSION: No acute intracranial hemorrhage. 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 No acute/traumatic cervical spine pathology. Bilateral supraclavicular and right cervical as well as  mediastinal adenopathy. Further evaluation recommended. Electronically Signed   By: Anner Crete M.D.   On: 01/23/2015 18:27   Ir Angiogram Visceral Selective  01/23/2015  CLINICAL DATA:  64 year old male presents with acute life-threatening upper GI bleed secondary to an ulcerated mass encompassing the distal stomach and duodenum. 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 Endoscopic treatment options are limited and surgery is extremely high risk. Patient presents for emergent visceral arteriography and embolization. Additionally, central venous access is required. A triple-lumen central venous catheter will be placed during the course of the procedure. EXAM: SELECTIVE VISCERAL ARTERIOGRAPHY; IR EMBO ART VEN HEMORR LYMPH EXTRAV INC GUIDE ROADMAPPING; IR LEFT FLOURO GUIDE CV LINE; ADDITIONAL ARTERIOGRAPHY; IR ULTRASOUND GUIDANCE VASC ACCESS LEFT; ARTERIOGRAPHY Date: 01/23/2015 PROCEDURE: 1. Ultrasound-guided puncture left common femoral artery 2. Catheterization of the superior mesenteric artery with arteriogram 3. Catheterization of the celiac artery with arteriogram 4. Catheterization of the gastroduodenal artery with arteriogram 5. Catheterization of the right gastroepiploic artery with arteriogram 6. Coil embolization of the gastroduodenal artery 7. Post embolization arteriogram 8. Limited left common femoral arteriogram 9. Ultrasound-guided puncture of the left common femoral vein 10. Placement of a triple-lumen central venous catheter via the left femoral vein with fluoroscopic guidance Interventional Radiologist:  Criselda Peaches, MD ANESTHESIA/SEDATION: Moderate (conscious) sedation was used. 1 mg Versed, 50 mcg Fentanyl were administered intravenously. The patient's vital signs were monitored continuously by radiology nursing throughout the procedure. Sedation Time: 60 minutes MEDICATIONS: None additional  FLUOROSCOPY TIME:  16 minutes for a total of 622 mGy CONTRAST:  165mL OMNIPAQUE IOHEXOL 300 MG/ML  SOLN TECHNIQUE: Informed consent was obtained from the patient following explanation of the procedure, risks, benefits and alternatives. The patient understands, agrees and consents for the procedure. All questions were addressed. A time out was performed. Maximal barrier sterile technique utilized including caps, mask, sterile gowns, sterile gloves, large sterile drape, hand hygiene, and Betadine skin prep. The left groin was interrogated with ultrasound. The common femoral artery is found to be widely patent. An image was obtained and stored for the medical record. Local anesthesia was attained by infiltration with 1% lidocaine. Under real-time sonographic guidance, the vessel was punctured with a 21 gauge micropuncture needle. Using standard technique, the initial micro wire was exchanged through a transitional 5 [place] micro sheath for a working 0.035 inch Bentson wire. The micro sheath was then exchanged for a working 5 [place] vascular sheath. A C2 cobra catheter was advanced into the abdominal aorta over the wire. The catheter was initially advanced into the superior mesenteric artery and superior mesenteric arteriogram was performed. There is conventional anatomy. No replaced or accessory right hepatic artery. No evidence of active extravasation or significant collateralization through the pancreaticoduodenal arcade. The C2 cobra catheter was next advanced into the celiac artery. An arteriogram was performed. The gastroduodenal artery is elongated and slightly irregular in the mid segment likely secondary to external mass effect. There is no evidence of active extravasation. A renegade ST micro catheter was advanced coaxially over a fat and 16 wire. The micro catheter was advanced into  the gastroduodenal artery. Arteriography was again performed. There are multiple branches arising from the gastroduodenal artery  providing supply to a hypervascular mass in the region the descending duodenum. The micro catheter was carefully advanced more distally into the proximal right gastroepiploic artery. Contrast injection was performed confirming the location of the catheter tip as well as confirming no additional supplied to the region of tumor. Coil embolization was then performed of the gastroduodenal artery using a combination of vortex, standard helical and soft interlock detachable micro coils ranging in size from 2-6 mm. Post embolization arteriography confirms successful embolization of the vessel with no further visible hypervascular blush of the duodenum tumor. The micro catheter was removed. Repeat celiac artery injection was performed confirming no additional collateral supply. The 5 French catheter was readvanced of the superior mesenteric artery and an additional angiogram was performed again confirming no collateral flow to the embolized territory. The catheters were removed. The vascular sheath was left in place for hemodynamic monitoring. Ultrasound was again used to interrogate the left groin. The left common femoral vein was identified. Local anesthesia was attained by infiltration with 1% lidocaine. The left common femoral vein was punctured with an 18 gauge needle. A wire was advanced into the inferior vena cava. The skin tract was dilated and an arrow triple-lumen central venous catheter advanced over the wire and position with the tip at the confluence of the IVC and left common iliac vein. The catheter was flushed, capped and secured with 0 Prolene suture. Sterile bandages were applied. COMPLICATIONS: None IMPRESSION: 1. No evidence of active extravasation. 2. 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 3. Coil embolization of the gastroduodenal artery. 4. Placement of a left femoral triple-lumen central venous catheter. The  catheter tip is at the confluence of the IVC and iliac vein and ready for immediate use. Signed, Criselda Peaches, MD [facility] Radiology Electronically Signed   By: Jacqulynn Cadet M.D.   On: 01/23/2015 15:50   Ir Angiogram Visceral Selective  01/23/2015  CLINICAL DATA:  64 year old male presents with acute life-threatening upper GI bleed secondary to an ulcerated mass encompassing the distal stomach and duodenum. 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 Endoscopic treatment options are limited and surgery is extremely high risk. Patient presents for emergent visceral arteriography and embolization. Additionally, central venous access is required. A triple-lumen central venous catheter will be placed during the course of the procedure. EXAM: SELECTIVE VISCERAL ARTERIOGRAPHY; IR EMBO ART VEN HEMORR LYMPH EXTRAV INC GUIDE ROADMAPPING; IR LEFT FLOURO GUIDE CV LINE; ADDITIONAL ARTERIOGRAPHY; IR ULTRASOUND GUIDANCE VASC ACCESS LEFT; ARTERIOGRAPHY Date: 01/23/2015 PROCEDURE: 1. Ultrasound-guided puncture left common femoral artery 2. Catheterization of the superior mesenteric artery with arteriogram 3. Catheterization of the celiac artery with arteriogram 4. Catheterization of the gastroduodenal artery with arteriogram 5. Catheterization of the right gastroepiploic artery with arteriogram 6. Coil embolization of the gastroduodenal artery 7. Post embolization arteriogram 8. Limited left common femoral arteriogram 9. Ultrasound-guided puncture of the left common femoral vein 10. Placement of a triple-lumen central venous catheter via the left femoral vein with fluoroscopic guidance Interventional Radiologist:  Criselda Peaches, MD ANESTHESIA/SEDATION: Moderate (conscious) sedation was used. 1 mg Versed, 50 mcg Fentanyl were administered intravenously. The patient's vital  signs were monitored continuously by radiology nursing throughout the procedure. Sedation Time: 60 minutes MEDICATIONS: None additional FLUOROSCOPY TIME:  16 minutes for a total of 622 mGy CONTRAST:  133mL OMNIPAQUE IOHEXOL 300 MG/ML  SOLN TECHNIQUE: Informed consent was obtained from the patient following explanation of the procedure, risks, benefits and alternatives. The patient understands, agrees and consents for the procedure. All questions were addressed. A time out was performed. Maximal barrier sterile technique utilized including caps, mask, sterile gowns, sterile gloves, large sterile drape, hand hygiene, and Betadine skin prep. The left groin was interrogated with ultrasound. The common femoral artery is found to be widely patent. An image was obtained and stored for the medical record. Local anesthesia was attained by infiltration with 1% lidocaine. Under real-time sonographic guidance, the vessel was punctured with a 21 gauge micropuncture needle. Using standard technique, the initial micro wire was exchanged through a transitional 5 [place] micro sheath for a working 0.035 inch Bentson wire. The micro sheath was then exchanged for a working 5 [place] vascular sheath. A C2 cobra catheter was advanced into the abdominal aorta over the wire. The catheter was initially advanced into the superior mesenteric artery and superior mesenteric arteriogram was performed. There is conventional anatomy. No replaced or accessory right hepatic artery. No evidence of active extravasation or significant collateralization through the pancreaticoduodenal arcade. The C2 cobra catheter was next advanced into the celiac artery. An arteriogram was performed. The gastroduodenal artery is elongated and slightly irregular in the mid segment likely secondary to external mass effect. There is no evidence of active extravasation. A renegade ST micro catheter was advanced coaxially over a fat and 16 wire. The micro catheter was  advanced into the gastroduodenal artery. Arteriography was again performed. There are multiple branches arising from the gastroduodenal artery providing supply to a hypervascular mass in the region the descending duodenum. The micro catheter was carefully advanced more distally into the proximal right gastroepiploic artery. Contrast injection was performed confirming the location of the catheter tip as well as confirming no additional supplied to the region of tumor. Coil embolization was then performed of the gastroduodenal artery using a combination of vortex, standard helical and soft interlock detachable micro coils ranging in size from 2-6 mm. Post embolization arteriography confirms successful embolization of the vessel with no further visible hypervascular blush of the duodenum tumor. The micro catheter was removed. Repeat celiac artery injection was performed confirming no additional collateral supply. The 5 French catheter was readvanced of the superior mesenteric artery and an additional angiogram was performed again confirming no collateral flow to the embolized territory. The catheters were removed. The vascular sheath was left in place for hemodynamic monitoring. Ultrasound was again used to interrogate the left groin. The left common femoral vein was identified. Local anesthesia was attained by infiltration with 1% lidocaine. The left common femoral vein was punctured with an 18 gauge needle. A wire was advanced into the inferior vena cava. The skin tract was dilated and an arrow triple-lumen central venous catheter advanced over the wire and position with the tip at the confluence of the IVC and left common iliac vein. The catheter was flushed, capped and secured with 0 Prolene suture. Sterile bandages were applied. COMPLICATIONS: None IMPRESSION: 1. No evidence of active extravasation. 2. Positive hypervascular tumor blush in the region of the descending duodenum with arterial supply from the  gastroduodenal artery. 3. Coil embolization of the gastroduodenal artery. 4. Placement of a left femoral triple-lumen central venous catheter. The catheter tip is at the confluence of the IVC and iliac vein and ready for immediate use. Signed, Antonietta Jewel.  Laurence Ferrari, MD [facility] Radiology Electronically Signed   By: Jacqulynn Cadet M.D.   On: 01/23/2015 15:50   Ir Angiogram Selective Each Additional Vessel  01/23/2015  CLINICAL DATA:  64 year old male presents with acute life-threatening upper GI bleed secondary to an ulcerated mass encompassing the distal stomach and duodenum. 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 Endoscopic treatment options are limited and surgery is extremely high risk. Patient presents for emergent visceral arteriography and embolization. Additionally, central venous access is required. A triple-lumen central venous catheter will be placed during the course of the procedure. EXAM: SELECTIVE VISCERAL ARTERIOGRAPHY; IR EMBO ART VEN HEMORR LYMPH EXTRAV INC GUIDE ROADMAPPING; IR LEFT FLOURO GUIDE CV LINE; ADDITIONAL ARTERIOGRAPHY; IR ULTRASOUND GUIDANCE VASC ACCESS LEFT; ARTERIOGRAPHY Date: 01/23/2015 PROCEDURE: 1. Ultrasound-guided puncture left common femoral artery 2. Catheterization of the superior mesenteric artery with arteriogram 3. Catheterization of the celiac artery with arteriogram 4. Catheterization of the gastroduodenal artery with arteriogram 5. Catheterization of the right gastroepiploic artery with arteriogram 6. Coil embolization of the gastroduodenal artery 7. Post embolization arteriogram 8. Limited left common femoral arteriogram 9. Ultrasound-guided puncture of the left common femoral vein 10. Placement of a triple-lumen central venous catheter via the left femoral vein with fluoroscopic guidance Interventional Radiologist:  Criselda Peaches, MD ANESTHESIA/SEDATION: Moderate (conscious) sedation was used. 1 mg Versed, 50 mcg Fentanyl were administered intravenously. The patient's vital signs were monitored continuously by radiology nursing throughout the procedure. Sedation Time: 60 minutes MEDICATIONS: None additional FLUOROSCOPY TIME:  16 minutes for a total of 622 mGy CONTRAST:  142mL OMNIPAQUE IOHEXOL 300 MG/ML  SOLN TECHNIQUE: Informed consent was obtained from the patient following explanation of the procedure, risks, benefits and alternatives. The patient understands, agrees and consents for the procedure. All questions were addressed. A time out was performed. Maximal barrier sterile technique utilized including caps, mask, sterile gowns, sterile gloves, large sterile drape, hand hygiene, and Betadine skin prep. The left groin was interrogated with ultrasound. The common femoral artery is found to be widely patent. An image was obtained and stored for the medical record. Local anesthesia was attained by infiltration with 1% lidocaine. Under real-time sonographic guidance, the vessel was punctured with a 21 gauge micropuncture needle. Using standard technique, the initial micro wire was exchanged through a transitional 5 [place] micro sheath for a working 0.035 inch Bentson wire. The micro sheath was then exchanged for a working 5 [place] vascular sheath. A C2 cobra catheter was advanced into the abdominal aorta over the wire. The catheter was initially advanced into the superior mesenteric artery and superior mesenteric arteriogram was performed. There is conventional anatomy. No replaced or accessory right hepatic artery. No evidence of active extravasation or significant collateralization through the pancreaticoduodenal arcade. The C2 cobra catheter was next advanced into the celiac artery. An arteriogram was performed. The gastroduodenal artery is elongated and slightly irregular in the mid segment likely secondary to external mass  effect. There is no evidence of active extravasation. A renegade ST micro catheter was advanced coaxially over a fat and 16 wire. The micro catheter was advanced into the gastroduodenal artery. Arteriography was again performed. There are multiple branches arising from the gastroduodenal artery providing supply to a hypervascular mass in the region the descending duodenum. The micro catheter was carefully advanced more distally into the proximal right gastroepiploic artery. Contrast injection was performed confirming the location of the catheter tip as well  as confirming no additional supplied to the region of tumor. Coil embolization was then performed of the gastroduodenal artery using a combination of vortex, standard helical and soft interlock detachable micro coils ranging in size from 2-6 mm. Post embolization arteriography confirms successful embolization of the vessel with no further visible hypervascular blush of the duodenum tumor. The micro catheter was removed. Repeat celiac artery injection was performed confirming no additional collateral supply. The 5 French catheter was readvanced of the superior mesenteric artery and an additional angiogram was performed again confirming no collateral flow to the embolized territory. The catheters were removed. The vascular sheath was left in place for hemodynamic monitoring. Ultrasound was again used to interrogate the left groin. The left common femoral vein was identified. Local anesthesia was attained by infiltration with 1% lidocaine. The left common femoral vein was punctured with an 18 gauge needle. A wire was advanced into the inferior vena cava. The skin tract was dilated and an arrow triple-lumen central venous catheter advanced over the wire and position with the tip at the confluence of the IVC and left common iliac vein. The catheter was flushed, capped and secured with 0 Prolene suture. Sterile bandages were applied. COMPLICATIONS: None IMPRESSION: 1.  No evidence of active extravasation. 2. 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 3. Coil embolization of the gastroduodenal artery. 4. Placement of a left femoral triple-lumen central venous catheter. The catheter tip is at the confluence of the IVC and iliac vein and ready for immediate use. Signed, Criselda Peaches, MD [facility] Radiology Electronically Signed   By: Jacqulynn Cadet M.D.   On: 01/23/2015 15:50   Ir Angiogram Follow Up Study  01/23/2015  CLINICAL DATA:  64 year old male presents with acute life-threatening upper GI bleed secondary to an ulcerated mass encompassing the distal stomach and duodenum. 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 Endoscopic treatment options are limited and surgery is extremely high risk. Patient presents for emergent visceral arteriography and embolization. Additionally, central venous access is required. A triple-lumen central venous catheter will be placed during the course of the procedure. EXAM: SELECTIVE VISCERAL ARTERIOGRAPHY; IR EMBO ART VEN HEMORR LYMPH EXTRAV INC GUIDE ROADMAPPING; IR LEFT FLOURO GUIDE CV LINE; ADDITIONAL ARTERIOGRAPHY; IR ULTRASOUND GUIDANCE VASC ACCESS LEFT; ARTERIOGRAPHY Date: 01/23/2015 PROCEDURE: 1. Ultrasound-guided puncture left common femoral artery 2. Catheterization of the superior mesenteric artery with arteriogram 3. Catheterization of the celiac artery with arteriogram 4. Catheterization of the gastroduodenal artery with arteriogram 5. Catheterization of the right gastroepiploic artery with arteriogram 6. Coil embolization of the gastroduodenal artery 7. Post embolization arteriogram 8. Limited left common femoral arteriogram 9. Ultrasound-guided puncture of the left common femoral vein 10. Placement of a  triple-lumen central venous catheter via the left femoral vein with fluoroscopic guidance Interventional Radiologist:  Criselda Peaches, MD ANESTHESIA/SEDATION: Moderate (conscious) sedation was used. 1 mg Versed, 50 mcg Fentanyl were administered intravenously. The patient's vital signs were monitored continuously by radiology nursing throughout the procedure. Sedation Time: 60 minutes MEDICATIONS: None additional FLUOROSCOPY TIME:  16 minutes for a total of 622 mGy CONTRAST:  154mL OMNIPAQUE IOHEXOL 300 MG/ML  SOLN TECHNIQUE: Informed consent was obtained from the patient following explanation of the procedure, risks, benefits and alternatives. The patient understands, agrees and consents for the procedure. All questions were addressed. A time out was performed. Maximal barrier sterile  technique utilized including caps, mask, sterile gowns, sterile gloves, large sterile drape, hand hygiene, and Betadine skin prep. The left groin was interrogated with ultrasound. The common femoral artery is found to be widely patent. An image was obtained and stored for the medical record. Local anesthesia was attained by infiltration with 1% lidocaine. Under real-time sonographic guidance, the vessel was punctured with a 21 gauge micropuncture needle. Using standard technique, the initial micro wire was exchanged through a transitional 5 [place] micro sheath for a working 0.035 inch Bentson wire. The micro sheath was then exchanged for a working 5 [place] vascular sheath. A C2 cobra catheter was advanced into the abdominal aorta over the wire. The catheter was initially advanced into the superior mesenteric artery and superior mesenteric arteriogram was performed. There is conventional anatomy. No replaced or accessory right hepatic artery. No evidence of active extravasation or significant collateralization through the pancreaticoduodenal arcade. The C2 cobra catheter was next advanced into the celiac artery. An arteriogram was  performed. The gastroduodenal artery is elongated and slightly irregular in the mid segment likely secondary to external mass effect. There is no evidence of active extravasation. A renegade ST micro catheter was advanced coaxially over a fat and 16 wire. The micro catheter was advanced into the gastroduodenal artery. Arteriography was again performed. There are multiple branches arising from the gastroduodenal artery providing supply to a hypervascular mass in the region the descending duodenum. The micro catheter was carefully advanced more distally into the proximal right gastroepiploic artery. Contrast injection was performed confirming the location of the catheter tip as well as confirming no additional supplied to the region of tumor. Coil embolization was then performed of the gastroduodenal artery using a combination of vortex, standard helical and soft interlock detachable micro coils ranging in size from 2-6 mm. Post embolization arteriography confirms successful embolization of the vessel with no further visible hypervascular blush of the duodenum tumor. The micro catheter was removed. Repeat celiac artery injection was performed confirming no additional collateral supply. The 5 French catheter was readvanced of the superior mesenteric artery and an additional angiogram was performed again confirming no collateral flow to the embolized territory. The catheters were removed. The vascular sheath was left in place for hemodynamic monitoring. Ultrasound was again used to interrogate the left groin. The left common femoral vein was identified. Local anesthesia was attained by infiltration with 1% lidocaine. The left common femoral vein was punctured with an 18 gauge needle. A wire was advanced into the inferior vena cava. The skin tract was dilated and an arrow triple-lumen central venous catheter advanced over the wire and position with the tip at the confluence of the IVC and left common iliac vein. The  catheter was flushed, capped and secured with 0 Prolene suture. Sterile bandages were applied. COMPLICATIONS: None IMPRESSION: 1. No evidence of active extravasation. 2. 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 3. Coil embolization of the gastroduodenal artery. 4. Placement of a left femoral triple-lumen central venous catheter. The catheter tip is at the confluence of the IVC and iliac vein and ready for immediate use. Signed, Criselda Peaches, MD [facility] Radiology Electronically Signed   By: Jacqulynn Cadet M.D.   On: 01/23/2015 15:50   Ir Fluoro Guide Cv Line Left  01/23/2015  CLINICAL DATA:  64 year old male presents with acute life-threatening upper GI bleed secondary to an ulcerated mass encompassing the distal stomach  and duodenum. 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 Endoscopic treatment options are limited and surgery is extremely high risk. Patient presents for emergent visceral arteriography and embolization. Additionally, central venous access is required. A triple-lumen central venous catheter will be placed during the course of the procedure. EXAM: SELECTIVE VISCERAL ARTERIOGRAPHY; IR EMBO ART VEN HEMORR LYMPH EXTRAV INC GUIDE ROADMAPPING; IR LEFT FLOURO GUIDE CV LINE; ADDITIONAL ARTERIOGRAPHY; IR ULTRASOUND GUIDANCE VASC ACCESS LEFT; ARTERIOGRAPHY Date: 01/23/2015 PROCEDURE: 1. Ultrasound-guided puncture left common femoral artery 2. Catheterization of the superior mesenteric artery with arteriogram 3. Catheterization of the celiac artery with arteriogram 4. Catheterization of the gastroduodenal artery with arteriogram 5. Catheterization of the right gastroepiploic artery with arteriogram 6. Coil embolization of the gastroduodenal artery 7. Post embolization  arteriogram 8. Limited left common femoral arteriogram 9. Ultrasound-guided puncture of the left common femoral vein 10. Placement of a triple-lumen central venous catheter via the left femoral vein with fluoroscopic guidance Interventional Radiologist:  Criselda Peaches, MD ANESTHESIA/SEDATION: Moderate (conscious) sedation was used. 1 mg Versed, 50 mcg Fentanyl were administered intravenously. The patient's vital signs were monitored continuously by radiology nursing throughout the procedure. Sedation Time: 60 minutes MEDICATIONS: None additional FLUOROSCOPY TIME:  16 minutes for a total of 622 mGy CONTRAST:  163mL OMNIPAQUE IOHEXOL 300 MG/ML  SOLN TECHNIQUE: Informed consent was obtained from the patient following explanation of the procedure, risks, benefits and alternatives. The patient understands, agrees and consents for the procedure. All questions were addressed. A time out was performed. Maximal barrier sterile technique utilized including caps, mask, sterile gowns, sterile gloves, large sterile drape, hand hygiene, and Betadine skin prep. The left groin was interrogated with ultrasound. The common femoral artery is found to be widely patent. An image was obtained and stored for the medical record. Local anesthesia was attained by infiltration with 1% lidocaine. Under real-time sonographic guidance, the vessel was punctured with a 21 gauge micropuncture needle. Using standard technique, the initial micro wire was exchanged through a transitional 5 [place] micro sheath for a working 0.035 inch Bentson wire. The micro sheath was then exchanged for a working 5 [place] vascular sheath. A C2 cobra catheter was advanced into the abdominal aorta over the wire. The catheter was initially advanced into the superior mesenteric artery and superior mesenteric arteriogram was performed. There is conventional anatomy. No replaced or accessory right hepatic artery. No evidence of active extravasation or significant  collateralization through the pancreaticoduodenal arcade. The C2 cobra catheter was next advanced into the celiac artery. An arteriogram was performed. The gastroduodenal artery is elongated and slightly irregular in the mid segment likely secondary to external mass effect. There is no evidence of active extravasation. A renegade ST micro catheter was advanced coaxially over a fat and 16 wire. The micro catheter was advanced into the gastroduodenal artery. Arteriography was again performed. There are multiple branches arising from the gastroduodenal artery providing supply to a hypervascular mass in the region the descending duodenum. The micro catheter was carefully advanced more distally into the proximal right gastroepiploic artery. Contrast injection was performed confirming the location of the catheter tip as well as confirming no additional supplied to the region of tumor. Coil embolization was then performed of the gastroduodenal artery using a combination of vortex, standard helical and soft interlock detachable micro coils ranging in size from 2-6 mm. Post embolization arteriography confirms successful embolization of the vessel with no further visible hypervascular blush of the  duodenum tumor. The micro catheter was removed. Repeat celiac artery injection was performed confirming no additional collateral supply. The 5 French catheter was readvanced of the superior mesenteric artery and an additional angiogram was performed again confirming no collateral flow to the embolized territory. The catheters were removed. The vascular sheath was left in place for hemodynamic monitoring. Ultrasound was again used to interrogate the left groin. The left common femoral vein was identified. Local anesthesia was attained by infiltration with 1% lidocaine. The left common femoral vein was punctured with an 18 gauge needle. A wire was advanced into the inferior vena cava. The skin tract was dilated and an arrow  triple-lumen central venous catheter advanced over the wire and position with the tip at the confluence of the IVC and left common iliac vein. The catheter was flushed, capped and secured with 0 Prolene suture. Sterile bandages were applied. COMPLICATIONS: None IMPRESSION: 1. No evidence of active extravasation. 2. 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 3. Coil embolization of the gastroduodenal artery. 4. Placement of a left femoral triple-lumen central venous catheter. The catheter tip is at the confluence of the IVC and iliac vein and ready for immediate use. Signed, Criselda Peaches, MD [facility] Radiology Electronically Signed   By: Jacqulynn Cadet M.D.   On: 01/23/2015 15:50   Ir US Guide Vasc Access Left  01/23/2015  CLINICAL DATA:  64 year old male presents with acute life-threatening upper GI bleed secondary to an ulcerated mass encompassing the distal stomach and duodenum. 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 Endoscopic treatment options are limited and surgery is extremely high risk. Patient presents for emergent visceral arteriography and embolization. Additionally, central venous access is required. A triple-lumen central venous catheter will be placed during the course of the procedure. EXAM: SELECTIVE VISCERAL ARTERIOGRAPHY; IR EMBO ART VEN HEMORR LYMPH EXTRAV INC GUIDE ROADMAPPING; IR LEFT FLOURO GUIDE CV LINE; ADDITIONAL ARTERIOGRAPHY; IR ULTRASOUND GUIDANCE VASC ACCESS LEFT; ARTERIOGRAPHY Date: 01/23/2015 PROCEDURE: 1. Ultrasound-guided puncture left common femoral artery 2. Catheterization of the superior mesenteric artery with arteriogram 3. Catheterization of the celiac artery with arteriogram 4. Catheterization of the gastroduodenal artery with arteriogram  5. Catheterization of the right gastroepiploic artery with arteriogram 6. Coil embolization of the gastroduodenal artery 7. Post embolization arteriogram 8. Limited left common femoral arteriogram 9. Ultrasound-guided puncture of the left common femoral vein 10. Placement of a triple-lumen central venous catheter via the left femoral vein with fluoroscopic guidance Interventional Radiologist:  Criselda Peaches, MD ANESTHESIA/SEDATION: Moderate (conscious) sedation was used. 1 mg Versed, 50 mcg Fentanyl were administered intravenously. The patient's vital signs were monitored continuously by radiology nursing throughout the procedure. Sedation Time: 60 minutes MEDICATIONS: None additional FLUOROSCOPY TIME:  16 minutes for a total of 622 mGy CONTRAST:  160mL OMNIPAQUE IOHEXOL 300 MG/ML  SOLN TECHNIQUE: Informed consent was obtained from the patient following explanation of the procedure, risks, benefits and alternatives. The patient understands, agrees and consents for the procedure. All questions were addressed. A time out was performed. Maximal barrier sterile technique utilized including caps, mask, sterile gowns, sterile gloves, large sterile drape, hand hygiene, and Betadine skin prep. The left groin was interrogated with ultrasound. The common femoral artery is found to be widely patent. An image was obtained and stored for the medical record. Local anesthesia was attained by infiltration with 1% lidocaine.  Under real-time sonographic guidance, the vessel was punctured with a 21 gauge micropuncture needle. Using standard technique, the initial micro wire was exchanged through a transitional 5 [place] micro sheath for a working 0.035 inch Bentson wire. The micro sheath was then exchanged for a working 5 [place] vascular sheath. A C2 cobra catheter was advanced into the abdominal aorta over the wire. The catheter was initially advanced into the superior mesenteric artery and superior mesenteric arteriogram was  performed. There is conventional anatomy. No replaced or accessory right hepatic artery. No evidence of active extravasation or significant collateralization through the pancreaticoduodenal arcade. The C2 cobra catheter was next advanced into the celiac artery. An arteriogram was performed. The gastroduodenal artery is elongated and slightly irregular in the mid segment likely secondary to external mass effect. There is no evidence of active extravasation. A renegade ST micro catheter was advanced coaxially over a fat and 16 wire. The micro catheter was advanced into the gastroduodenal artery. Arteriography was again performed. There are multiple branches arising from the gastroduodenal artery providing supply to a hypervascular mass in the region the descending duodenum. The micro catheter was carefully advanced more distally into the proximal right gastroepiploic artery. Contrast injection was performed confirming the location of the catheter tip as well as confirming no additional supplied to the region of tumor. Coil embolization was then performed of the gastroduodenal artery using a combination of vortex, standard helical and soft interlock detachable micro coils ranging in size from 2-6 mm. Post embolization arteriography confirms successful embolization of the vessel with no further visible hypervascular blush of the duodenum tumor. The micro catheter was removed. Repeat celiac artery injection was performed confirming no additional collateral supply. The 5 French catheter was readvanced of the superior mesenteric artery and an additional angiogram was performed again confirming no collateral flow to the embolized territory. The catheters were removed. The vascular sheath was left in place for hemodynamic monitoring. Ultrasound was again used to interrogate the left groin. The left common femoral vein was identified. Local anesthesia was attained by infiltration with 1% lidocaine. The left common femoral  vein was punctured with an 18 gauge needle. A wire was advanced into the inferior vena cava. The skin tract was dilated and an arrow triple-lumen central venous catheter advanced over the wire and position with the tip at the confluence of the IVC and left common iliac vein. The catheter was flushed, capped and secured with 0 Prolene suture. Sterile bandages were applied. COMPLICATIONS: None IMPRESSION: 1. No evidence of active extravasation. 2. 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 3. Coil embolization of the gastroduodenal artery. 4. Placement of a left femoral triple-lumen central venous catheter. The catheter tip is at the confluence of the IVC and iliac vein and ready for immediate use. Signed, Criselda Peaches, MD [facility] Radiology Electronically Signed   By: Jacqulynn Cadet M.D.   On: 01/23/2015 15:50   Ir US Guide Vasc Access Left  01/23/2015  CLINICAL DATA:  64 year old male presents with acute life-threatening upper GI bleed secondary to an ulcerated mass encompassing the distal stomach and duodenum. 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 Endoscopic treatment options are limited and surgery is extremely high risk. Patient presents for emergent visceral arteriography and embolization. Additionally, central venous access is required. A triple-lumen central venous catheter will be placed during the course  of the procedure. EXAM: SELECTIVE VISCERAL ARTERIOGRAPHY; IR EMBO ART VEN HEMORR LYMPH EXTRAV INC GUIDE ROADMAPPING; IR LEFT FLOURO GUIDE CV LINE; ADDITIONAL ARTERIOGRAPHY; IR ULTRASOUND GUIDANCE VASC ACCESS LEFT; ARTERIOGRAPHY Date: 01/23/2015 PROCEDURE: 1. Ultrasound-guided puncture left common femoral artery 2. Catheterization of the superior mesenteric artery with  arteriogram 3. Catheterization of the celiac artery with arteriogram 4. Catheterization of the gastroduodenal artery with arteriogram 5. Catheterization of the right gastroepiploic artery with arteriogram 6. Coil embolization of the gastroduodenal artery 7. Post embolization arteriogram 8. Limited left common femoral arteriogram 9. Ultrasound-guided puncture of the left common femoral vein 10. Placement of a triple-lumen central venous catheter via the left femoral vein with fluoroscopic guidance Interventional Radiologist:  Criselda Peaches, MD ANESTHESIA/SEDATION: Moderate (conscious) sedation was used. 1 mg Versed, 50 mcg Fentanyl were administered intravenously. The patient's vital signs were monitored continuously by radiology nursing throughout the procedure. Sedation Time: 60 minutes MEDICATIONS: None additional FLUOROSCOPY TIME:  16 minutes for a total of 622 mGy CONTRAST:  112mL OMNIPAQUE IOHEXOL 300 MG/ML  SOLN TECHNIQUE: Informed consent was obtained from the patient following explanation of the procedure, risks, benefits and alternatives. The patient understands, agrees and consents for the procedure. All questions were addressed. A time out was performed. Maximal barrier sterile technique utilized including caps, mask, sterile gowns, sterile gloves, large sterile drape, hand hygiene, and Betadine skin prep. The left groin was interrogated with ultrasound. The common femoral artery is found to be widely patent. An image was obtained and stored for the medical record. Local anesthesia was attained by infiltration with 1% lidocaine. Under real-time sonographic guidance, the vessel was punctured with a 21 gauge micropuncture needle. Using standard technique, the initial micro wire was exchanged through a transitional 5 [place] micro sheath for a working 0.035 inch Bentson wire. The micro sheath was then exchanged for a working 5 [place] vascular sheath. A C2 cobra catheter was advanced into the abdominal  aorta over the wire. The catheter was initially advanced into the superior mesenteric artery and superior mesenteric arteriogram was performed. There is conventional anatomy. No replaced or accessory right hepatic artery. No evidence of active extravasation or significant collateralization through the pancreaticoduodenal arcade. The C2 cobra catheter was next advanced into the celiac artery. An arteriogram was performed. The gastroduodenal artery is elongated and slightly irregular in the mid segment likely secondary to external mass effect. There is no evidence of active extravasation. A renegade ST micro catheter was advanced coaxially over a fat and 16 wire. The micro catheter was advanced into the gastroduodenal artery. Arteriography was again performed. There are multiple branches arising from the gastroduodenal artery providing supply to a hypervascular mass in the region the descending duodenum. The micro catheter was carefully advanced more distally into the proximal right gastroepiploic artery. Contrast injection was performed confirming the location of the catheter tip as well as confirming no additional supplied to the region of tumor. Coil embolization was then performed of the gastroduodenal artery using a combination of vortex, standard helical and soft interlock detachable micro coils ranging in size from 2-6 mm. Post embolization arteriography confirms successful embolization of the vessel with no further visible hypervascular blush of the duodenum tumor. The micro catheter was removed. Repeat celiac artery injection was performed confirming no additional collateral supply. The 5 French catheter was readvanced of the superior mesenteric artery and an additional angiogram was performed again confirming no collateral flow to the embolized territory. The catheters were removed. The vascular sheath was left in  place for hemodynamic monitoring. Ultrasound was again used to interrogate the left groin. The  left common femoral vein was identified. Local anesthesia was attained by infiltration with 1% lidocaine. The left common femoral vein was punctured with an 18 gauge needle. A wire was advanced into the inferior vena cava. The skin tract was dilated and an arrow triple-lumen central venous catheter advanced over the wire and position with the tip at the confluence of the IVC and left common iliac vein. The catheter was flushed, capped and secured with 0 Prolene suture. Sterile bandages were applied. COMPLICATIONS: None IMPRESSION: 1. No evidence of active extravasation. 2. 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 3. Coil embolization of the gastroduodenal artery. 4. Placement of a left femoral triple-lumen central venous catheter. The catheter tip is at the confluence of the IVC and iliac vein and ready for immediate use. Signed, Criselda Peaches, MD [facility] Radiology Electronically Signed   By: Jacqulynn Cadet M.D.   On: 01/23/2015 15:50   Portable Chest Xray  01/24/2015  CLINICAL DATA:  Respiratory failure. EXAM: PORTABLE CHEST 1 VIEW COMPARISON:  01/23/2015 FINDINGS: Endotracheal tube and NG tube in stable position. Cardiomegaly with normal pulmonary vascularity.Persistent increased density noted over the right Lower lung, most likely layering pleural effusion. Underlying pulmonary infiltrate cannot be excluded. Small left pleural effusion cannot be excluded. Low lung volumes basilar atelectasis. No pneumothorax. IMPRESSION: 1. Lines and tubes in stable position. 2. Persistent increased density of the right lower lung, most likely layering pleural effusion. Underlying infiltrate cannot be excluded. 3. Low lung volumes with basilar atelectasis. Electronically Signed   By: Marcello Moores  Register   On: 01/24/2015 07:39   [place]  01/23/2015  CLINICAL DATA:  64 year old male presents with acute life-threatening upper GI bleed secondary to an ulcerated mass encompassing the distal stomach and duodenum. 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 Endoscopic treatment options are limited and surgery is extremely high risk. Patient presents for emergent visceral arteriography and embolization. Additionally, central venous access is required. A triple-lumen central venous catheter will be placed during the course of the procedure. EXAM: SELECTIVE VISCERAL ARTERIOGRAPHY; IR EMBO ART VEN HEMORR LYMPH EXTRAV INC GUIDE ROADMAPPING; IR LEFT FLOURO GUIDE CV LINE; ADDITIONAL ARTERIOGRAPHY; IR ULTRASOUND GUIDANCE VASC ACCESS LEFT; ARTERIOGRAPHY Date: 01/23/2015 PROCEDURE: 1. Ultrasound-guided puncture left common femoral artery 2. Catheterization of the superior mesenteric artery with arteriogram 3. Catheterization of the celiac artery with arteriogram 4. Catheterization of the gastroduodenal artery with arteriogram 5. Catheterization of the right gastroepiploic artery with arteriogram 6. Coil embolization of the gastroduodenal artery 7. Post embolization arteriogram 8. Limited left common femoral arteriogram 9. Ultrasound-guided puncture of the left common femoral vein 10. Placement of a triple-lumen central venous catheter via the left femoral vein with fluoroscopic guidance Interventional Radiologist:  Criselda Peaches, MD ANESTHESIA/SEDATION: Moderate (conscious) sedation was used. 1 mg Versed, 50 mcg Fentanyl were administered intravenously. The patient's vital signs were monitored continuously by radiology nursing throughout the procedure. Sedation Time: 60 minutes MEDICATIONS: None additional FLUOROSCOPY TIME:  16 minutes for a total of 622 mGy CONTRAST:  179mL OMNIPAQUE IOHEXOL 300 MG/ML  SOLN TECHNIQUE: Informed consent was obtained from the  patient following explanation of the procedure, risks, benefits and alternatives. The patient understands, agrees and consents for  the procedure. All questions were addressed. A time out was performed. Maximal barrier sterile technique utilized including caps, mask, sterile gowns, sterile gloves, large sterile drape, hand hygiene, and Betadine skin prep. The left groin was interrogated with ultrasound. The common femoral artery is found to be widely patent. An image was obtained and stored for the medical record. Local anesthesia was attained by infiltration with 1% lidocaine. Under real-time sonographic guidance, the vessel was punctured with a 21 gauge micropuncture needle. Using standard technique, the initial micro wire was exchanged through a transitional 5 [place] micro sheath for a working 0.035 inch Bentson wire. The micro sheath was then exchanged for a working 5 [place] vascular sheath. A C2 cobra catheter was advanced into the abdominal aorta over the wire. The catheter was initially advanced into the superior mesenteric artery and superior mesenteric arteriogram was performed. There is conventional anatomy. No replaced or accessory right hepatic artery. No evidence of active extravasation or significant collateralization through the pancreaticoduodenal arcade. The C2 cobra catheter was next advanced into the celiac artery. An arteriogram was performed. The gastroduodenal artery is elongated and slightly irregular in the mid segment likely secondary to external mass effect. There is no evidence of active extravasation. A renegade ST micro catheter was advanced coaxially over a fat and 16 wire. The micro catheter was advanced into the gastroduodenal artery. Arteriography was again performed. There are multiple branches arising from the gastroduodenal artery providing supply to a hypervascular mass in the region the descending duodenum. The micro catheter was carefully advanced more distally into the proximal  right gastroepiploic artery. Contrast injection was performed confirming the location of the catheter tip as well as confirming no additional supplied to the region of tumor. Coil embolization was then performed of the gastroduodenal artery using a combination of vortex, standard helical and soft interlock detachable micro coils ranging in size from 2-6 mm. Post embolization arteriography confirms successful embolization of the vessel with no further visible hypervascular blush of the duodenum tumor. The micro catheter was removed. Repeat celiac artery injection was performed confirming no additional collateral supply. The 5 French catheter was readvanced of the superior mesenteric artery and an additional angiogram was performed again confirming no collateral flow to the embolized territory. The catheters were removed. The vascular sheath was left in place for hemodynamic monitoring. Ultrasound was again used to interrogate the left groin. The left common femoral vein was identified. Local anesthesia was attained by infiltration with 1% lidocaine. The left common femoral vein was punctured with an 18 gauge needle. A wire was advanced into the inferior vena cava. The skin tract was dilated and an arrow triple-lumen central venous catheter advanced over the wire and position with the tip at the confluence of the IVC and left common iliac vein. The catheter was flushed, capped and secured with 0 Prolene suture. Sterile bandages were applied. COMPLICATIONS: None IMPRESSION: 1. No evidence of active extravasation. 2. 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 3. Coil embolization of the gastroduodenal artery. 4. Placement of a left femoral triple-lumen central venous catheter. The catheter tip is at the confluence of the IVC and iliac vein and ready for immediate use. Signed, Criselda Peaches, MD [facility] Radiology Electronically Signed   By: Jacqulynn Cadet M.D.   On: 01/23/2015 15:50     STUDIES:  IR for visceral angiogram 12/20>> CT head without contrast 12/20>> CXR 12/20  CULTURES: Blood, urine and sputum>>12/20  ANTIBIOTICS: Zosyn  SIGNIFICANT EVENTS: 12/14: ED with syncope, dizziness, black tools and was found to be profoundly anemic. CT and EGD done; large duodenal mass and suspicion for lymphoma  12/19: Right inguinal lymph node biopsy 12/20: Syncope and ED with extensive GI bleed  LINES/TUBES: PIVs ETT  NGT  DISCUSSION: 64 YO AA male with newly diagnosed duodenal mass, suspected lymphoma and severe blood loss anemia presenting with hypovolemic shock s/p right inguinal lymph node biopsy and acute respiratory failure necessitating intubation.   ASSESSMENT / PLAN:  PULMONARY A: Acute respiratory failure. High risk for aspiration penumonitis P:   Extubate   CARDIOVASCULAR A:  Hypovolemic shock secondary to acute blood loss Uncontrolled hypertension    - off pressors P:  Hold \ antihypertensives  RENAL A:   AKI secondary to volume depletion ; baseline creatinine 0.93; 12/20 1.71   - stabilizing. 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 Still at risk for re-bleed. Keep in ICU 1 more day  - Inter-disciplinary family meet or Palliative Care meeting due by: day 7     Critical Care Time devoted to patient care services described in this note is  30  Minutes. This time reflects time of care of this signee Dr Brand Males. This critical care time does not reflect procedure time, or teaching time or supervisory time of PA/NP/Med student/Med Resident etc but could involve care discussion time    Dr. Brand Males, M.D., [facility].C.P Pulmonary and Critical Care Medicine Staff Physician [facility] Pager: [phone], If no answer or between  15:00h - 7:00h: call [phone]  01/24/2015 9:48 AM

## 2015-01-24 NOTE — Progress Notes (Signed)
Subjective: Upper GI bleed from large duodenal ulcer. 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 Sitting up in bed talking  Allergies: Review of patient's allergies indicates no known allergies.  Medications:  Current facility-administered medications:  .  0.9 %  sodium chloride infusion, , Intravenous, Once, Noemi Chapel, MD .  0.9 %  sodium chloride infusion, 250 mL, Intravenous, PRN, Mikael Spray, NP .  0.9 %  sodium chloride infusion, , Intravenous, Continuous, Brand Males, MD, Last Rate: 75 mL/hr at 01/24/15 1005 .  antiseptic oral rinse solution (CORINZ), 7 mL, Mouth Rinse, QID, Mikael Spray, NP, 7 mL at 01/24/15 0430 .  chlorhexidine gluconate (PERIDEX) 0.12 % solution 15 mL, 15 mL, Mouth Rinse, BID, Mikael Spray, NP, 15 mL at 01/24/15 0803 .  magnesium sulfate IVPB 1 g 100 mL, 1 g, Intravenous, Once, Brand Males, MD .  midazolam (VERSED) 2 MG/2ML injection, , , ,  .  [COMPLETED] octreotide (SANDOSTATIN) 2 mcg/mL load via infusion 50 mcg, 50 mcg, Intravenous, Once, 50 mcg at 01/23/15 0952 **AND** octreotide (SANDOSTATIN) 500 mcg in sodium chloride 0.9 % 250 mL (2 mcg/mL) infusion, 50 mcg/hr, Intravenous, Continuous, Noemi Chapel, MD, Last Rate: 25 mL/hr at 01/24/15 1000, 50 mcg/hr at 01/24/15 1000 .  ondansetron (ZOFRAN) injection 4 mg, 4 mg, Intravenous, Q6H PRN, Mikael Spray, NP .  pantoprazole (PROTONIX) 80 mg in sodium chloride 0.9 % 250 mL (0.32 mg/mL) infusion, 8 mg/hr, Intravenous, Continuous, Noemi Chapel, MD, Last Rate: 25 mL/hr at 01/24/15 1004, 8 mg/hr at 01/24/15 1004 .  piperacillin-tazobactam (ZOSYN) IVPB 3.375 g, 3.375 g, Intravenous, 3 times per day, Myrene Galas, RPH, 3.375 g at 01/24/15 0519    Vital Signs: BP 125/78 mmHg  Pulse 96  Temp(Src) 97.9 F (36.6 C) (Axillary)  Resp 24  Ht 5\' 11"  (1.803 m)  Wt 218 lb 4.1 oz (99 kg)  BMI 30.45 kg/m2  SpO2  99%  Physical Exam  Constitutional: He is oriented to person, place, and time. He appears well-developed. No distress.  Cardiovascular: Normal rate, regular rhythm and normal heart sounds.   (L)groin soft, no hematoma (L)CFV TLC intact Foot warm  Pulmonary/Chest: Effort normal and breath sounds normal. No respiratory distress.  Abdominal: Soft. There is no tenderness.  Continues to have some thin bloody/watery output in NG  Neurological: He is alert and oriented to person, place, and time.  Psychiatric: He has a normal mood and affect.     Labs:  CBC:  Recent Labs  01/23/15 1400 01/23/15 1710 01/23/15 2230 01/24/15 0430  WBC 16.6* 19.2* 17.0* 14.5*  HGB 8.5* 8.6* 8.0* 7.5*  HCT 25.3* 25.5* 23.7* 22.1*  PLT 215 224 216 207    COAGS:  Recent Labs  01/17/15 1928 01/17/15 2119 01/18/15 0941 01/23/15 0751  INR 1.20  --  1.14 1.53*  APTT  --  31  --   --     BMP:  Recent Labs  01/21/15 0422 01/22/15 0400 01/23/15 0751 01/23/15 0812 01/24/15 0430  NA 138 138 147* 142 141  K 3.7 3.8 5.7* 5.5* 4.3  CL 108 106 111 113* 114*  CO2 24 25 8*  --  21*  GLUCOSE 108* 98 359* 328* 142*  BUN <5* <5* 11 13 21*  CALCIUM 7.9* 8.1* 9.2  --  6.7*  CREATININE 1.03 0.94 1.71* 1.50* 1.65*  GFRNONAA >60 >60 41*  --  43*  GFRAA >  60 >60 48*  --  50*    LIVER FUNCTION TESTS:  Recent Labs  01/19/15 0235 01/21/15 0422 01/23/15 0751 01/24/15 0430  BILITOT 0.8 0.5 0.2* 0.5  AST 35 23 44* 39  ALT 73* 50 37 37  ALKPHOS 62 64 67 61  PROT 5.1* 5.3* 4.5* 4.1*  ALBUMIN 2.4* 2.5* 2.3* 2.0*    Assessment and Plan: S/p coil embo GDA for large bleeding DU NGT to remain for now, PPI, octreotide gtt IR will follow along for a few days  Signed: Ascencion Dike 01/24/2015, 11:12 AM   I spent a total of 15 Minutes at the the patient's bedside AND on the patient's hospital floor or unit, greater than 50% of which was counseling/coordinating care for coil embolization of GDA  for bleeding duodenal ulcer

## 2015-01-24 NOTE — Progress Notes (Signed)
Subjective: He is about to be extubated, still has some blood in NG.    Objective: Vital signs in last 24 hours: Temp:  [95 F (35 C)-99.3 F (37.4 C)] 97.9 F (36.6 C) (12/21 0738) Pulse Rate:  [71-126] 78 (12/21 0745) Resp:  [0-22] 14 (12/21 0745) BP: (62-179)/(51-129) 112/66 mmHg (12/21 0745) SpO2:  [100 %] 100 % (12/21 0745) FiO2 (%):  [40 %-100 %] 40 % (12/21 0745) Weight:  [99 kg (218 lb 4.1 oz)] 99 kg (218 lb 4.1 oz) (12/21 0500) Last BM Date: 01/23/15 Afebrile, VSS Labs stable after transfusion Pathology from lymph node biopsy: Lymph node, biopsy, right inguinal - MANTLE CELL LYMPHOMA. - SEE ONCOLOGY TABLE. Intake/Output from previous day: 12/20 0701 - 12/21 0700 In: 5751.3 [I.V.:4481.3; NG/GT:120; IV Piggyback:1150] Out: 1910 [Urine:1810; Emesis/NG output:100] Intake/Output this shift: Total I/O In: 496.7 [I.V.:496.7] Out: 175 [Urine:175]  General appearance: alert and no distress GI: soft, non-tender; bowel sounds normal; no masses,  no organomegaly Skin: biopsy site right groin is fine.  Lab Results:   Recent Labs  01/23/15 2230 01/24/15 0430  WBC 17.0* 14.5*  HGB 8.0* 7.5*  HCT 23.7* 22.1*  PLT 216 207    BMET  Recent Labs  01/23/15 0751 01/23/15 0812 01/24/15 0430  NA 147* 142 141  K 5.7* 5.5* 4.3  CL 111 113* 114*  CO2 8*  --  21*  GLUCOSE 359* 328* 142*  BUN 11 13 21*  CREATININE 1.71* 1.50* 1.65*  CALCIUM 9.2  --  6.7*   PT/INR  Recent Labs  01/23/15 0751  LABPROT 18.4*  INR 1.53*     Recent Labs Lab 01/18/15 0941 01/19/15 0235 01/21/15 0422 01/23/15 0751 01/24/15 0430  AST 42* 35 23 44* 39  ALT 84* 73* 50 37 37  ALKPHOS 73 62 64 67 61  BILITOT 1.4* 0.8 0.5 0.2* 0.5  PROT 5.2* 5.1* 5.3* 4.5* 4.1*  ALBUMIN 2.6* 2.4* 2.5* 2.3* 2.0*     Lipase  No results found for: LIPASE   Studies/Results: Ct Head Wo Contrast  01/23/2015  CLINICAL DATA:  64 year old male with fall. EXAM: CT HEAD WITHOUT CONTRAST CT  CERVICAL SPINE WITHOUT CONTRAST TECHNIQUE: Multidetector CT imaging of the head and cervical spine was performed following the standard protocol without intravenous contrast. Multiplanar CT image reconstructions of the cervical spine were also generated. COMPARISON:  None. FINDINGS: CT HEAD FINDINGS The ventricles and sulci are appropriate in size for patient's age. Mild periventricular and deep white matter hypodensities represent chronic microvascular ischemic changes. There is no intracranial hemorrhage. No mass effect or midline shift identified. An endotracheal and enteric tube are partially visualized. There is diffuse mucoperiosteal thickening of the paranasal sinuses with partial opacification of the ethmoid air cells. The mastoid air cells are well aerated. Right occipital scalp hematoma. The calvarium is intact. CT CERVICAL SPINE FINDINGS There is no acute fracture or subluxation of the cervical spine.Mild multilevel degenerative changes.The odontoid and spinous processes are intact.There is normal anatomic alignment of the C1-C2 lateral masses. The visualized soft tissues appear unremarkable. An enteric tube and an endotracheal tube are partially visualized. Partially visualized bilateral pleural effusions, right greater than left. There is bilateral supraclavicular and right cervical adenopathy. Upper mediastinal lymphadenopathy noted. Chest CT is recommended for further evaluation. Right supraclavicular soft tissue hemorrhage noted. IMPRESSION: No acute intracranial hemorrhage. 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 No acute/traumatic cervical spine pathology. Bilateral supraclavicular and right cervical as well as mediastinal adenopathy. Further evaluation recommended.  Electronically Signed   By: Anner Crete M.D.   On: 01/23/2015 18:27   Ct Cervical Spine Wo Contrast  01/23/2015  CLINICAL DATA:  64 year old male with fall. EXAM: CT HEAD WITHOUT CONTRAST CT  CERVICAL SPINE WITHOUT CONTRAST TECHNIQUE: Multidetector CT imaging of the head and cervical spine was performed following the standard protocol without intravenous contrast. Multiplanar CT image reconstructions of the cervical spine were also generated. COMPARISON:  None. FINDINGS: CT HEAD FINDINGS The ventricles and sulci are appropriate in size for patient's age. Mild periventricular and deep white matter hypodensities represent chronic microvascular ischemic changes. There is no intracranial hemorrhage. No mass effect or midline shift identified. An endotracheal and enteric tube are partially visualized. There is diffuse mucoperiosteal thickening of the paranasal sinuses with partial opacification of the ethmoid air cells. The mastoid air cells are well aerated. Right occipital scalp hematoma. The calvarium is intact. CT CERVICAL SPINE FINDINGS There is no acute fracture or subluxation of the cervical spine.Mild multilevel degenerative changes.The odontoid and spinous processes are intact.There is normal anatomic alignment of the C1-C2 lateral masses. The visualized soft tissues appear unremarkable. An enteric tube and an endotracheal tube are partially visualized. Partially visualized bilateral pleural effusions, right greater than left. There is bilateral supraclavicular and right cervical adenopathy. Upper mediastinal lymphadenopathy noted. Chest CT is recommended for further evaluation. Right supraclavicular soft tissue hemorrhage noted. IMPRESSION: No acute intracranial hemorrhage. 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 No acute/traumatic cervical spine pathology. Bilateral supraclavicular and right cervical as well as mediastinal adenopathy. Further evaluation recommended. Electronically Signed   By: Anner Crete M.D.   On: 01/23/2015 18:27   Ir Angiogram Visceral Selective  01/23/2015  CLINICAL DATA:  64 year old male presents with acute life-threatening upper GI  bleed secondary to an ulcerated mass encompassing the distal stomach and duodenum. 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 Endoscopic treatment options are limited and surgery is extremely high risk. Patient presents for emergent visceral arteriography and embolization. Additionally, central venous access is required. A triple-lumen central venous catheter will be placed during the course of the procedure. EXAM: SELECTIVE VISCERAL ARTERIOGRAPHY; IR EMBO ART VEN HEMORR LYMPH EXTRAV INC GUIDE ROADMAPPING; IR LEFT FLOURO GUIDE CV LINE; ADDITIONAL ARTERIOGRAPHY; IR ULTRASOUND GUIDANCE VASC ACCESS LEFT; ARTERIOGRAPHY Date: 01/23/2015 PROCEDURE: 1. Ultrasound-guided puncture left common femoral artery 2. Catheterization of the superior mesenteric artery with arteriogram 3. Catheterization of the celiac artery with arteriogram 4. Catheterization of the gastroduodenal artery with arteriogram 5. Catheterization of the right gastroepiploic artery with arteriogram 6. Coil embolization of the gastroduodenal artery 7. Post embolization arteriogram 8. Limited left common femoral arteriogram 9. Ultrasound-guided puncture of the left common femoral vein 10. Placement of a triple-lumen central venous catheter via the left femoral vein with fluoroscopic guidance Interventional Radiologist:  Criselda Peaches, MD ANESTHESIA/SEDATION: Moderate (conscious) sedation was used. 1 mg Versed, 50 mcg Fentanyl were administered intravenously. The patient's vital signs were monitored continuously by radiology nursing throughout the procedure. Sedation Time: 60 minutes MEDICATIONS: None additional FLUOROSCOPY TIME:  16 minutes for a total of 622 mGy CONTRAST:  157mL OMNIPAQUE IOHEXOL 300 MG/ML  SOLN TECHNIQUE: Informed consent was obtained from the patient following explanation of the procedure, risks, benefits and alternatives. The patient understands,  agrees and consents for the procedure. All questions were addressed. A time out was performed. Maximal barrier sterile technique utilized including caps, mask, sterile gowns, sterile gloves, large sterile drape, hand hygiene,  and Betadine skin prep. The left groin was interrogated with ultrasound. The common femoral artery is found to be widely patent. An image was obtained and stored for the medical record. Local anesthesia was attained by infiltration with 1% lidocaine. Under real-time sonographic guidance, the vessel was punctured with a 21 gauge micropuncture needle. Using standard technique, the initial micro wire was exchanged through a transitional 5 [place] micro sheath for a working 0.035 inch Bentson wire. The micro sheath was then exchanged for a working 5 [place] vascular sheath. A C2 cobra catheter was advanced into the abdominal aorta over the wire. The catheter was initially advanced into the superior mesenteric artery and superior mesenteric arteriogram was performed. There is conventional anatomy. No replaced or accessory right hepatic artery. No evidence of active extravasation or significant collateralization through the pancreaticoduodenal arcade. The C2 cobra catheter was next advanced into the celiac artery. An arteriogram was performed. The gastroduodenal artery is elongated and slightly irregular in the mid segment likely secondary to external mass effect. There is no evidence of active extravasation. A renegade ST micro catheter was advanced coaxially over a fat and 16 wire. The micro catheter was advanced into the gastroduodenal artery. Arteriography was again performed. There are multiple branches arising from the gastroduodenal artery providing supply to a hypervascular mass in the region the descending duodenum. The micro catheter was carefully advanced more distally into the proximal right gastroepiploic artery. Contrast injection was performed confirming the location of the catheter tip  as well as confirming no additional supplied to the region of tumor. Coil embolization was then performed of the gastroduodenal artery using a combination of vortex, standard helical and soft interlock detachable micro coils ranging in size from 2-6 mm. Post embolization arteriography confirms successful embolization of the vessel with no further visible hypervascular blush of the duodenum tumor. The micro catheter was removed. Repeat celiac artery injection was performed confirming no additional collateral supply. The 5 French catheter was readvanced of the superior mesenteric artery and an additional angiogram was performed again confirming no collateral flow to the embolized territory. The catheters were removed. The vascular sheath was left in place for hemodynamic monitoring. Ultrasound was again used to interrogate the left groin. The left common femoral vein was identified. Local anesthesia was attained by infiltration with 1% lidocaine. The left common femoral vein was punctured with an 18 gauge needle. A wire was advanced into the inferior vena cava. The skin tract was dilated and an arrow triple-lumen central venous catheter advanced over the wire and position with the tip at the confluence of the IVC and left common iliac vein. The catheter was flushed, capped and secured with 0 Prolene suture. Sterile bandages were applied. COMPLICATIONS: None IMPRESSION: 1. No evidence of active extravasation. 2. 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 3. Coil embolization of the gastroduodenal artery. 4. Placement of a left femoral triple-lumen central venous catheter. The catheter tip is at the confluence of the IVC and iliac vein and ready for immediate use. Signed, Criselda Peaches, MD [facility] Radiology Electronically Signed   By: Jacqulynn Cadet M.D.   On: 01/23/2015  15:50   Ir Angiogram Visceral Selective  01/23/2015  CLINICAL DATA:  64 year old male presents with acute life-threatening upper GI bleed secondary to an ulcerated mass encompassing the distal stomach and duodenum. The patient's underlying etiology is favored to represent lymphoma which should is currently undergoing  confirmatory diagnosis. Endoscopic treatment options are limited and surgery is extremely high risk. Patient presents for emergent visceral arteriography and embolization. Additionally, central venous access is required. A triple-lumen central venous catheter will be placed during the course of the procedure. EXAM: SELECTIVE VISCERAL ARTERIOGRAPHY; IR EMBO ART VEN HEMORR LYMPH EXTRAV INC GUIDE ROADMAPPING; IR LEFT FLOURO GUIDE CV LINE; ADDITIONAL ARTERIOGRAPHY; IR ULTRASOUND GUIDANCE VASC ACCESS LEFT; ARTERIOGRAPHY Date: 01/23/2015 PROCEDURE: 1. Ultrasound-guided puncture left common femoral artery 2. Catheterization of the superior mesenteric artery with arteriogram 3. Catheterization of the celiac artery with arteriogram 4. Catheterization of the gastroduodenal artery with arteriogram 5. Catheterization of the right gastroepiploic artery with arteriogram 6. Coil embolization of the gastroduodenal artery 7. Post embolization arteriogram 8. Limited left common femoral arteriogram 9. Ultrasound-guided puncture of the left common femoral vein 10. Placement of a triple-lumen central venous catheter via the left femoral vein with fluoroscopic guidance Interventional Radiologist:  Criselda Peaches, MD ANESTHESIA/SEDATION: Moderate (conscious) sedation was used. 1 mg Versed, 50 mcg Fentanyl were administered intravenously. The patient's vital signs were monitored continuously by radiology nursing throughout the procedure. Sedation Time: 60 minutes MEDICATIONS: None additional FLUOROSCOPY TIME:  16 minutes for a total of 622 mGy CONTRAST:  156mL OMNIPAQUE IOHEXOL 300 MG/ML  SOLN TECHNIQUE: Informed  consent was obtained from the patient following explanation of the procedure, risks, benefits and alternatives. The patient understands, agrees and consents for the procedure. All questions were addressed. A time out was performed. Maximal barrier sterile technique utilized including caps, mask, sterile gowns, sterile gloves, large sterile drape, hand hygiene, and Betadine skin prep. The left groin was interrogated with ultrasound. The common femoral artery is found to be widely patent. An image was obtained and stored for the medical record. Local anesthesia was attained by infiltration with 1% lidocaine. Under real-time sonographic guidance, the vessel was punctured with a 21 gauge micropuncture needle. Using standard technique, the initial micro wire was exchanged through a transitional 5 [place] micro sheath for a working 0.035 inch Bentson wire. The micro sheath was then exchanged for a working 5 [place] vascular sheath. A C2 cobra catheter was advanced into the abdominal aorta over the wire. The catheter was initially advanced into the superior mesenteric artery and superior mesenteric arteriogram was performed. There is conventional anatomy. No replaced or accessory right hepatic artery. No evidence of active extravasation or significant collateralization through the pancreaticoduodenal arcade. The C2 cobra catheter was next advanced into the celiac artery. An arteriogram was performed. The gastroduodenal artery is elongated and slightly irregular in the mid segment likely secondary to external mass effect. There is no evidence of active extravasation. A renegade ST micro catheter was advanced coaxially over a fat and 16 wire. The micro catheter was advanced into the gastroduodenal artery. Arteriography was again performed. There are multiple branches arising from the gastroduodenal artery providing supply to a hypervascular mass in the region the descending duodenum. The micro catheter was carefully advanced  more distally into the proximal right gastroepiploic artery. Contrast injection was performed confirming the location of the catheter tip as well as confirming no additional supplied to the region of tumor. Coil embolization was then performed of the gastroduodenal artery using a combination of vortex, standard helical and soft interlock detachable micro coils ranging in size from 2-6 mm. Post embolization arteriography confirms successful embolization of the vessel with no further visible hypervascular blush of the duodenum tumor. The micro catheter was removed. Repeat celiac artery injection was performed confirming no additional  collateral supply. The 5 French catheter was readvanced of the superior mesenteric artery and an additional angiogram was performed again confirming no collateral flow to the embolized territory. The catheters were removed. The vascular sheath was left in place for hemodynamic monitoring. Ultrasound was again used to interrogate the left groin. The left common femoral vein was identified. Local anesthesia was attained by infiltration with 1% lidocaine. The left common femoral vein was punctured with an 18 gauge needle. A wire was advanced into the inferior vena cava. The skin tract was dilated and an arrow triple-lumen central venous catheter advanced over the wire and position with the tip at the confluence of the IVC and left common iliac vein. The catheter was flushed, capped and secured with 0 Prolene suture. Sterile bandages were applied. COMPLICATIONS: None IMPRESSION: 1. No evidence of active extravasation. 2. 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 3. Coil embolization of the gastroduodenal artery. 4. Placement of a left femoral triple-lumen central venous catheter. The catheter tip is at the confluence of the IVC and iliac vein and ready for immediate use. Signed, Criselda Peaches, MD  [facility] Radiology Electronically Signed   By: Jacqulynn Cadet M.D.   On: 01/23/2015 15:50   Ir Angiogram Selective Each Additional Vessel  01/23/2015  CLINICAL DATA:  64 year old male presents with acute life-threatening upper GI bleed secondary to an ulcerated mass encompassing the distal stomach and duodenum. 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 Endoscopic treatment options are limited and surgery is extremely high risk. Patient presents for emergent visceral arteriography and embolization. Additionally, central venous access is required. A triple-lumen central venous catheter will be placed during the course of the procedure. EXAM: SELECTIVE VISCERAL ARTERIOGRAPHY; IR EMBO ART VEN HEMORR LYMPH EXTRAV INC GUIDE ROADMAPPING; IR LEFT FLOURO GUIDE CV LINE; ADDITIONAL ARTERIOGRAPHY; IR ULTRASOUND GUIDANCE VASC ACCESS LEFT; ARTERIOGRAPHY Date: 01/23/2015 PROCEDURE: 1. Ultrasound-guided puncture left common femoral artery 2. Catheterization of the superior mesenteric artery with arteriogram 3. Catheterization of the celiac artery with arteriogram 4. Catheterization of the gastroduodenal artery with arteriogram 5. Catheterization of the right gastroepiploic artery with arteriogram 6. Coil embolization of the gastroduodenal artery 7. Post embolization arteriogram 8. Limited left common femoral arteriogram 9. Ultrasound-guided puncture of the left common femoral vein 10. Placement of a triple-lumen central venous catheter via the left femoral vein with fluoroscopic guidance Interventional Radiologist:  Criselda Peaches, MD ANESTHESIA/SEDATION: Moderate (conscious) sedation was used. 1 mg Versed, 50 mcg Fentanyl were administered intravenously. The patient's vital signs were monitored continuously by radiology nursing throughout the procedure. Sedation Time: 60 minutes  MEDICATIONS: None additional FLUOROSCOPY TIME:  16 minutes for a total of 622 mGy CONTRAST:  167mL OMNIPAQUE IOHEXOL 300 MG/ML  SOLN TECHNIQUE: Informed consent was obtained from the patient following explanation of the procedure, risks, benefits and alternatives. The patient understands, agrees and consents for the procedure. All questions were addressed. A time out was performed. Maximal barrier sterile technique utilized including caps, mask, sterile gowns, sterile gloves, large sterile drape, hand hygiene, and Betadine skin prep. The left groin was interrogated with ultrasound. The common femoral artery is found to be widely patent. An image was obtained and stored for the medical record. Local anesthesia was attained by infiltration with 1% lidocaine. Under real-time sonographic guidance, the vessel was punctured with a 21 gauge micropuncture needle. Using standard  technique, the initial micro wire was exchanged through a transitional 5 [place] micro sheath for a working 0.035 inch Bentson wire. The micro sheath was then exchanged for a working 5 [place] vascular sheath. A C2 cobra catheter was advanced into the abdominal aorta over the wire. The catheter was initially advanced into the superior mesenteric artery and superior mesenteric arteriogram was performed. There is conventional anatomy. No replaced or accessory right hepatic artery. No evidence of active extravasation or significant collateralization through the pancreaticoduodenal arcade. The C2 cobra catheter was next advanced into the celiac artery. An arteriogram was performed. The gastroduodenal artery is elongated and slightly irregular in the mid segment likely secondary to external mass effect. There is no evidence of active extravasation. A renegade ST micro catheter was advanced coaxially over a fat and 16 wire. The micro catheter was advanced into the gastroduodenal artery. Arteriography was again performed. There are multiple branches arising  from the gastroduodenal artery providing supply to a hypervascular mass in the region the descending duodenum. The micro catheter was carefully advanced more distally into the proximal right gastroepiploic artery. Contrast injection was performed confirming the location of the catheter tip as well as confirming no additional supplied to the region of tumor. Coil embolization was then performed of the gastroduodenal artery using a combination of vortex, standard helical and soft interlock detachable micro coils ranging in size from 2-6 mm. Post embolization arteriography confirms successful embolization of the vessel with no further visible hypervascular blush of the duodenum tumor. The micro catheter was removed. Repeat celiac artery injection was performed confirming no additional collateral supply. The 5 French catheter was readvanced of the superior mesenteric artery and an additional angiogram was performed again confirming no collateral flow to the embolized territory. The catheters were removed. The vascular sheath was left in place for hemodynamic monitoring. Ultrasound was again used to interrogate the left groin. The left common femoral vein was identified. Local anesthesia was attained by infiltration with 1% lidocaine. The left common femoral vein was punctured with an 18 gauge needle. A wire was advanced into the inferior vena cava. The skin tract was dilated and an arrow triple-lumen central venous catheter advanced over the wire and position with the tip at the confluence of the IVC and left common iliac vein. The catheter was flushed, capped and secured with 0 Prolene suture. Sterile bandages were applied. COMPLICATIONS: None IMPRESSION: 1. No evidence of active extravasation. 2. 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 3. Coil embolization of the gastroduodenal artery. 4. Placement of a left femoral triple-lumen  central venous catheter. The catheter tip is at the confluence of the IVC and iliac vein and ready for immediate use. Signed, Criselda Peaches, MD [facility] Radiology Electronically Signed   By: Jacqulynn Cadet M.D.   On: 01/23/2015 15:50   Ir Angiogram Follow Up Study  01/23/2015  CLINICAL DATA:  64 year old male presents with acute life-threatening upper GI bleed secondary to an ulcerated mass encompassing the distal stomach and duodenum. 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 Endoscopic treatment options are limited and surgery is extremely high risk. Patient presents for emergent visceral arteriography and embolization. Additionally, central venous access is required. A triple-lumen central venous catheter will be placed during the course of the procedure. EXAM: SELECTIVE VISCERAL ARTERIOGRAPHY; IR EMBO ART VEN HEMORR LYMPH EXTRAV INC GUIDE ROADMAPPING;  IR LEFT FLOURO GUIDE CV LINE; ADDITIONAL ARTERIOGRAPHY; IR ULTRASOUND GUIDANCE VASC ACCESS LEFT; ARTERIOGRAPHY Date: 01/23/2015 PROCEDURE: 1. Ultrasound-guided puncture left common femoral artery 2. Catheterization of the superior mesenteric artery with arteriogram 3. Catheterization of the celiac artery with arteriogram 4. Catheterization of the gastroduodenal artery with arteriogram 5. Catheterization of the right gastroepiploic artery with arteriogram 6. Coil embolization of the gastroduodenal artery 7. Post embolization arteriogram 8. Limited left common femoral arteriogram 9. Ultrasound-guided puncture of the left common femoral vein 10. Placement of a triple-lumen central venous catheter via the left femoral vein with fluoroscopic guidance Interventional Radiologist:  Criselda Peaches, MD ANESTHESIA/SEDATION: Moderate (conscious) sedation was used. 1 mg Versed, 50 mcg Fentanyl were administered  intravenously. The patient's vital signs were monitored continuously by radiology nursing throughout the procedure. Sedation Time: 60 minutes MEDICATIONS: None additional FLUOROSCOPY TIME:  16 minutes for a total of 622 mGy CONTRAST:  186mL OMNIPAQUE IOHEXOL 300 MG/ML  SOLN TECHNIQUE: Informed consent was obtained from the patient following explanation of the procedure, risks, benefits and alternatives. The patient understands, agrees and consents for the procedure. All questions were addressed. A time out was performed. Maximal barrier sterile technique utilized including caps, mask, sterile gowns, sterile gloves, large sterile drape, hand hygiene, and Betadine skin prep. The left groin was interrogated with ultrasound. The common femoral artery is found to be widely patent. An image was obtained and stored for the medical record. Local anesthesia was attained by infiltration with 1% lidocaine. Under real-time sonographic guidance, the vessel was punctured with a 21 gauge micropuncture needle. Using standard technique, the initial micro wire was exchanged through a transitional 5 [place] micro sheath for a working 0.035 inch Bentson wire. The micro sheath was then exchanged for a working 5 [place] vascular sheath. A C2 cobra catheter was advanced into the abdominal aorta over the wire. The catheter was initially advanced into the superior mesenteric artery and superior mesenteric arteriogram was performed. There is conventional anatomy. No replaced or accessory right hepatic artery. No evidence of active extravasation or significant collateralization through the pancreaticoduodenal arcade. The C2 cobra catheter was next advanced into the celiac artery. An arteriogram was performed. The gastroduodenal artery is elongated and slightly irregular in the mid segment likely secondary to external mass effect. There is no evidence of active extravasation. A renegade ST micro catheter was advanced coaxially over a fat and 16  wire. The micro catheter was advanced into the gastroduodenal artery. Arteriography was again performed. There are multiple branches arising from the gastroduodenal artery providing supply to a hypervascular mass in the region the descending duodenum. The micro catheter was carefully advanced more distally into the proximal right gastroepiploic artery. Contrast injection was performed confirming the location of the catheter tip as well as confirming no additional supplied to the region of tumor. Coil embolization was then performed of the gastroduodenal artery using a combination of vortex, standard helical and soft interlock detachable micro coils ranging in size from 2-6 mm. Post embolization arteriography confirms successful embolization of the vessel with no further visible hypervascular blush of the duodenum tumor. The micro catheter was removed. Repeat celiac artery injection was performed confirming no additional collateral supply. The 5 French catheter was readvanced of the superior mesenteric artery and an additional angiogram was performed again confirming no collateral flow to the embolized territory. The catheters were removed. The vascular sheath was left in place for hemodynamic monitoring. Ultrasound was again used to interrogate the left groin. The left common femoral  vein was identified. Local anesthesia was attained by infiltration with 1% lidocaine. The left common femoral vein was punctured with an 18 gauge needle. A wire was advanced into the inferior vena cava. The skin tract was dilated and an arrow triple-lumen central venous catheter advanced over the wire and position with the tip at the confluence of the IVC and left common iliac vein. The catheter was flushed, capped and secured with 0 Prolene suture. Sterile bandages were applied. COMPLICATIONS: None IMPRESSION: 1. No evidence of active extravasation. 2. Positive hypervascular tumor blush in the region of the descending duodenum with  arterial supply from the gastroduodenal artery. 3. Coil embolization of the gastroduodenal artery. 4. Placement of a left femoral triple-lumen central venous catheter. The catheter tip is at the confluence of the IVC and iliac vein and ready for immediate use. Signed, Criselda Peaches, MD [facility] Radiology Electronically Signed   By: Jacqulynn Cadet M.D.   On: 01/23/2015 15:50   Ir Fluoro Guide Cv Line Left  01/23/2015  CLINICAL DATA:  64 year old male presents with acute life-threatening upper GI bleed secondary to an ulcerated mass encompassing the distal stomach and duodenum. 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 Endoscopic treatment options are limited and surgery is extremely high risk. Patient presents for emergent visceral arteriography and embolization. Additionally, central venous access is required. A triple-lumen central venous catheter will be placed during the course of the procedure. EXAM: SELECTIVE VISCERAL ARTERIOGRAPHY; IR EMBO ART VEN HEMORR LYMPH EXTRAV INC GUIDE ROADMAPPING; IR LEFT FLOURO GUIDE CV LINE; ADDITIONAL ARTERIOGRAPHY; IR ULTRASOUND GUIDANCE VASC ACCESS LEFT; ARTERIOGRAPHY Date: 01/23/2015 PROCEDURE: 1. Ultrasound-guided puncture left common femoral artery 2. Catheterization of the superior mesenteric artery with arteriogram 3. Catheterization of the celiac artery with arteriogram 4. Catheterization of the gastroduodenal artery with arteriogram 5. Catheterization of the right gastroepiploic artery with arteriogram 6. Coil embolization of the gastroduodenal artery 7. Post embolization arteriogram 8. Limited left common femoral arteriogram 9. Ultrasound-guided puncture of the left common femoral vein 10. Placement of a triple-lumen central venous catheter via the left femoral vein with fluoroscopic guidance Interventional Radiologist:   Criselda Peaches, MD ANESTHESIA/SEDATION: Moderate (conscious) sedation was used. 1 mg Versed, 50 mcg Fentanyl were administered intravenously. The patient's vital signs were monitored continuously by radiology nursing throughout the procedure. Sedation Time: 60 minutes MEDICATIONS: None additional FLUOROSCOPY TIME:  16 minutes for a total of 622 mGy CONTRAST:  140mL OMNIPAQUE IOHEXOL 300 MG/ML  SOLN TECHNIQUE: Informed consent was obtained from the patient following explanation of the procedure, risks, benefits and alternatives. The patient understands, agrees and consents for the procedure. All questions were addressed. A time out was performed. Maximal barrier sterile technique utilized including caps, mask, sterile gowns, sterile gloves, large sterile drape, hand hygiene, and Betadine skin prep. The left groin was interrogated with ultrasound. The common femoral artery is found to be widely patent. An image was obtained and stored for the medical record. Local anesthesia was attained by infiltration with 1% lidocaine. Under real-time sonographic guidance, the vessel was punctured with a 21 gauge micropuncture needle. Using standard technique, the initial micro wire was exchanged through a transitional 5 [place] micro sheath for a working 0.035 inch Bentson wire. The micro sheath was then exchanged for a working 5 [place] vascular sheath. A C2 cobra catheter was advanced into the abdominal aorta over the wire. The catheter was initially advanced into the superior  mesenteric artery and superior mesenteric arteriogram was performed. There is conventional anatomy. No replaced or accessory right hepatic artery. No evidence of active extravasation or significant collateralization through the pancreaticoduodenal arcade. The C2 cobra catheter was next advanced into the celiac artery. An arteriogram was performed. The gastroduodenal artery is elongated and slightly irregular in the mid segment likely secondary to  external mass effect. There is no evidence of active extravasation. A renegade ST micro catheter was advanced coaxially over a fat and 16 wire. The micro catheter was advanced into the gastroduodenal artery. Arteriography was again performed. There are multiple branches arising from the gastroduodenal artery providing supply to a hypervascular mass in the region the descending duodenum. The micro catheter was carefully advanced more distally into the proximal right gastroepiploic artery. Contrast injection was performed confirming the location of the catheter tip as well as confirming no additional supplied to the region of tumor. Coil embolization was then performed of the gastroduodenal artery using a combination of vortex, standard helical and soft interlock detachable micro coils ranging in size from 2-6 mm. Post embolization arteriography confirms successful embolization of the vessel with no further visible hypervascular blush of the duodenum tumor. The micro catheter was removed. Repeat celiac artery injection was performed confirming no additional collateral supply. The 5 French catheter was readvanced of the superior mesenteric artery and an additional angiogram was performed again confirming no collateral flow to the embolized territory. The catheters were removed. The vascular sheath was left in place for hemodynamic monitoring. Ultrasound was again used to interrogate the left groin. The left common femoral vein was identified. Local anesthesia was attained by infiltration with 1% lidocaine. The left common femoral vein was punctured with an 18 gauge needle. A wire was advanced into the inferior vena cava. The skin tract was dilated and an arrow triple-lumen central venous catheter advanced over the wire and position with the tip at the confluence of the IVC and left common iliac vein. The catheter was flushed, capped and secured with 0 Prolene suture. Sterile bandages were applied. COMPLICATIONS: None  IMPRESSION: 1. No evidence of active extravasation. 2. 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 3. Coil embolization of the gastroduodenal artery. 4. Placement of a left femoral triple-lumen central venous catheter. The catheter tip is at the confluence of the IVC and iliac vein and ready for immediate use. Signed, Criselda Peaches, MD [facility] Radiology Electronically Signed   By: Jacqulynn Cadet M.D.   On: 01/23/2015 15:50   Ir US Guide Vasc Access Left  01/23/2015  CLINICAL DATA:  64 year old male presents with acute life-threatening upper GI bleed secondary to an ulcerated mass encompassing the distal stomach and duodenum. 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 Endoscopic treatment options are limited and surgery is extremely high risk. Patient presents for emergent visceral arteriography and embolization. Additionally, central venous access is required. A triple-lumen central venous catheter will be placed during the course of the procedure. EXAM: SELECTIVE VISCERAL ARTERIOGRAPHY; IR EMBO ART VEN HEMORR LYMPH EXTRAV INC GUIDE ROADMAPPING; IR LEFT FLOURO GUIDE CV LINE; ADDITIONAL ARTERIOGRAPHY; IR ULTRASOUND GUIDANCE VASC ACCESS LEFT; ARTERIOGRAPHY Date: 01/23/2015 PROCEDURE: 1. Ultrasound-guided puncture left common femoral artery 2. Catheterization of the superior mesenteric artery with arteriogram 3. Catheterization of the celiac artery with arteriogram 4. Catheterization of the gastroduodenal artery with arteriogram 5. Catheterization of the  right gastroepiploic artery with arteriogram 6. Coil embolization of the gastroduodenal artery 7. Post embolization arteriogram 8. Limited left common femoral arteriogram 9. Ultrasound-guided puncture of the left common femoral vein 10.  Placement of a triple-lumen central venous catheter via the left femoral vein with fluoroscopic guidance Interventional Radiologist:  Criselda Peaches, MD ANESTHESIA/SEDATION: Moderate (conscious) sedation was used. 1 mg Versed, 50 mcg Fentanyl were administered intravenously. The patient's vital signs were monitored continuously by radiology nursing throughout the procedure. Sedation Time: 60 minutes MEDICATIONS: None additional FLUOROSCOPY TIME:  16 minutes for a total of 622 mGy CONTRAST:  171mL OMNIPAQUE IOHEXOL 300 MG/ML  SOLN TECHNIQUE: Informed consent was obtained from the patient following explanation of the procedure, risks, benefits and alternatives. The patient understands, agrees and consents for the procedure. All questions were addressed. A time out was performed. Maximal barrier sterile technique utilized including caps, mask, sterile gowns, sterile gloves, large sterile drape, hand hygiene, and Betadine skin prep. The left groin was interrogated with ultrasound. The common femoral artery is found to be widely patent. An image was obtained and stored for the medical record. Local anesthesia was attained by infiltration with 1% lidocaine. Under real-time sonographic guidance, the vessel was punctured with a 21 gauge micropuncture needle. Using standard technique, the initial micro wire was exchanged through a transitional 5 [place] micro sheath for a working 0.035 inch Bentson wire. The micro sheath was then exchanged for a working 5 [place] vascular sheath. A C2 cobra catheter was advanced into the abdominal aorta over the wire. The catheter was initially advanced into the superior mesenteric artery and superior mesenteric arteriogram was performed. There is conventional anatomy. No replaced or accessory right hepatic artery. No evidence of active extravasation or significant collateralization through the pancreaticoduodenal arcade. The C2 cobra catheter was next advanced into the celiac artery. An  arteriogram was performed. The gastroduodenal artery is elongated and slightly irregular in the mid segment likely secondary to external mass effect. There is no evidence of active extravasation. A renegade ST micro catheter was advanced coaxially over a fat and 16 wire. The micro catheter was advanced into the gastroduodenal artery. Arteriography was again performed. There are multiple branches arising from the gastroduodenal artery providing supply to a hypervascular mass in the region the descending duodenum. The micro catheter was carefully advanced more distally into the proximal right gastroepiploic artery. Contrast injection was performed confirming the location of the catheter tip as well as confirming no additional supplied to the region of tumor. Coil embolization was then performed of the gastroduodenal artery using a combination of vortex, standard helical and soft interlock detachable micro coils ranging in size from 2-6 mm. Post embolization arteriography confirms successful embolization of the vessel with no further visible hypervascular blush of the duodenum tumor. The micro catheter was removed. Repeat celiac artery injection was performed confirming no additional collateral supply. The 5 French catheter was readvanced of the superior mesenteric artery and an additional angiogram was performed again confirming no collateral flow to the embolized territory. The catheters were removed. The vascular sheath was left in place for hemodynamic monitoring. Ultrasound was again used to interrogate the left groin. The left common femoral vein was identified. Local anesthesia was attained by infiltration with 1% lidocaine. The left common femoral vein was punctured with an 18 gauge needle. A wire was advanced into the inferior vena cava. The skin tract was dilated and an arrow triple-lumen central venous catheter advanced over the wire and position with the tip  at the confluence of the IVC and left common  iliac vein. The catheter was flushed, capped and secured with 0 Prolene suture. Sterile bandages were applied. COMPLICATIONS: None IMPRESSION: 1. No evidence of active extravasation. 2. 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 3. Coil embolization of the gastroduodenal artery. 4. Placement of a left femoral triple-lumen central venous catheter. The catheter tip is at the confluence of the IVC and iliac vein and ready for immediate use. Signed, Criselda Peaches, MD [facility] Radiology Electronically Signed   By: Jacqulynn Cadet M.D.   On: 01/23/2015 15:50   Ir US Guide Vasc Access Left  01/23/2015  CLINICAL DATA:  64 year old male presents with acute life-threatening upper GI bleed secondary to an ulcerated mass encompassing the distal stomach and duodenum. 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 Endoscopic treatment options are limited and surgery is extremely high risk. Patient presents for emergent visceral arteriography and embolization. Additionally, central venous access is required. A triple-lumen central venous catheter will be placed during the course of the procedure. EXAM: SELECTIVE VISCERAL ARTERIOGRAPHY; IR EMBO ART VEN HEMORR LYMPH EXTRAV INC GUIDE ROADMAPPING; IR LEFT FLOURO GUIDE CV LINE; ADDITIONAL ARTERIOGRAPHY; IR ULTRASOUND GUIDANCE VASC ACCESS LEFT; ARTERIOGRAPHY Date: 01/23/2015 PROCEDURE: 1. Ultrasound-guided puncture left common femoral artery 2. Catheterization of the superior mesenteric artery with arteriogram 3. Catheterization of the celiac artery with arteriogram 4. Catheterization of the gastroduodenal artery with arteriogram 5. Catheterization of the right gastroepiploic artery with arteriogram 6. Coil embolization of the gastroduodenal artery 7. Post  embolization arteriogram 8. Limited left common femoral arteriogram 9. Ultrasound-guided puncture of the left common femoral vein 10. Placement of a triple-lumen central venous catheter via the left femoral vein with fluoroscopic guidance Interventional Radiologist:  Criselda Peaches, MD ANESTHESIA/SEDATION: Moderate (conscious) sedation was used. 1 mg Versed, 50 mcg Fentanyl were administered intravenously. The patient's vital signs were monitored continuously by radiology nursing throughout the procedure. Sedation Time: 60 minutes MEDICATIONS: None additional FLUOROSCOPY TIME:  16 minutes for a total of 622 mGy CONTRAST:  18mL OMNIPAQUE IOHEXOL 300 MG/ML  SOLN TECHNIQUE: Informed consent was obtained from the patient following explanation of the procedure, risks, benefits and alternatives. The patient understands, agrees and consents for the procedure. All questions were addressed. A time out was performed. Maximal barrier sterile technique utilized including caps, mask, sterile gowns, sterile gloves, large sterile drape, hand hygiene, and Betadine skin prep. The left groin was interrogated with ultrasound. The common femoral artery is found to be widely patent. An image was obtained and stored for the medical record. Local anesthesia was attained by infiltration with 1% lidocaine. Under real-time sonographic guidance, the vessel was punctured with a 21 gauge micropuncture needle. Using standard technique, the initial micro wire was exchanged through a transitional 5 [place] micro sheath for a working 0.035 inch Bentson wire. The micro sheath was then exchanged for a working 5 [place] vascular sheath. A C2 cobra catheter was advanced into the abdominal aorta over the wire. The catheter was initially advanced into the superior mesenteric artery and superior mesenteric arteriogram was performed. There is conventional anatomy. No replaced or accessory right hepatic artery. No evidence of active extravasation or  significant collateralization through the pancreaticoduodenal arcade. The C2 cobra catheter was next advanced into the celiac artery. An arteriogram was performed. The gastroduodenal artery is elongated and slightly  irregular in the mid segment likely secondary to external mass effect. There is no evidence of active extravasation. A renegade ST micro catheter was advanced coaxially over a fat and 16 wire. The micro catheter was advanced into the gastroduodenal artery. Arteriography was again performed. There are multiple branches arising from the gastroduodenal artery providing supply to a hypervascular mass in the region the descending duodenum. The micro catheter was carefully advanced more distally into the proximal right gastroepiploic artery. Contrast injection was performed confirming the location of the catheter tip as well as confirming no additional supplied to the region of tumor. Coil embolization was then performed of the gastroduodenal artery using a combination of vortex, standard helical and soft interlock detachable micro coils ranging in size from 2-6 mm. Post embolization arteriography confirms successful embolization of the vessel with no further visible hypervascular blush of the duodenum tumor. The micro catheter was removed. Repeat celiac artery injection was performed confirming no additional collateral supply. The 5 French catheter was readvanced of the superior mesenteric artery and an additional angiogram was performed again confirming no collateral flow to the embolized territory. The catheters were removed. The vascular sheath was left in place for hemodynamic monitoring. Ultrasound was again used to interrogate the left groin. The left common femoral vein was identified. Local anesthesia was attained by infiltration with 1% lidocaine. The left common femoral vein was punctured with an 18 gauge needle. A wire was advanced into the inferior vena cava. The skin tract was dilated and an  arrow triple-lumen central venous catheter advanced over the wire and position with the tip at the confluence of the IVC and left common iliac vein. The catheter was flushed, capped and secured with 0 Prolene suture. Sterile bandages were applied. COMPLICATIONS: None IMPRESSION: 1. No evidence of active extravasation. 2. 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 3. Coil embolization of the gastroduodenal artery. 4. Placement of a left femoral triple-lumen central venous catheter. The catheter tip is at the confluence of the IVC and iliac vein and ready for immediate use. Signed, Criselda Peaches, MD [facility] Radiology Electronically Signed   By: Jacqulynn Cadet M.D.   On: 01/23/2015 15:50   Portable Chest Xray  01/24/2015  CLINICAL DATA:  Respiratory failure. EXAM: PORTABLE CHEST 1 VIEW COMPARISON:  01/23/2015 FINDINGS: Endotracheal tube and NG tube in stable position. Cardiomegaly with normal pulmonary vascularity.Persistent increased density noted over the right Lower lung, most likely layering pleural effusion. Underlying pulmonary infiltrate cannot be excluded. Small left pleural effusion cannot be excluded. Low lung volumes basilar atelectasis. No pneumothorax. IMPRESSION: 1. Lines and tubes in stable position. 2. Persistent increased density of the right lower lung, most likely layering pleural effusion. Underlying infiltrate cannot be excluded. 3. Low lung volumes with basilar atelectasis. Electronically Signed   By: Marcello Moores  Register   On: 01/24/2015 07:39   Dg Chest Portable 1 View  01/23/2015  CLINICAL DATA:  Assess orogastric tube positioning EXAM: PORTABLE CHEST 1 VIEW COMPARISON:  Portable chest x-ray of earlier today at 8:01 a.m. FINDINGS: The esophagogastric tube tip projects below the inferior margin of the image. There is no evidence of cord ongoing  within the esophagus. The endotracheal tube tip lies 4.1 cm above the carina. The cardiac silhouette is mildly enlarged but stable. The central pulmonary vascularity is engorged especially on the right. There is mild to moderate pulmonary interstitial edema on the right.  There is no pleural effusion or pneumothorax. The observed bony thorax exhibits no acute abnormality. External pacemaker -defibrillator pads. Are present IMPRESSION: 1. The esophagogastric tube tip projects below the inferior margin of the image with no evidence of coiling within the esophagus. The endotracheal tube is in appropriate position. 2. Persistently increased perihilar interstitial density especially on the right consistent with pulmonary interstitial edema. There may be pleural fluid on the right layering posteriorly contributing to the findings here. Electronically Signed   By: David  Martinique M.D.   On: 01/23/2015 09:22   Dg Chest Port 1 View  01/23/2015  CLINICAL DATA:  Status post intubation.  Intra abdominal mass. EXAM: PORTABLE CHEST 1 VIEW COMPARISON:  CT chest 01/21/2015.  CT abdomen 01/21/2015. FINDINGS: The patient has a new endotracheal tube in place with the tip 3.5 cm above the carina. Hazy opacity over the right chest is consistent with layering pleural effusion. Smaller left pleural effusion is noted. No consolidative process or pneumothorax. Heart size is upper normal. IMPRESSION: ET tube in good position with the tip projecting 3.5 cm above the carina. Right greater than left pleural effusions. Electronically Signed   By: Inge Rise M.D.   On: 01/23/2015 08:19   [place] Guide Roadmapping  01/23/2015  CLINICAL DATA:  64 year old male presents with acute life-threatening upper GI bleed secondary to an ulcerated mass encompassing the distal stomach and duodenum. The patient's underlying etiology is favored to represent lymphoma which should is currently undergoing confirmatory  diagnosis. Endoscopic treatment options are limited and surgery is extremely high risk. Patient presents for emergent visceral arteriography and embolization. Additionally, central venous access is required. A triple-lumen central venous catheter will be placed during the course of the procedure. EXAM: SELECTIVE VISCERAL ARTERIOGRAPHY; IR EMBO ART VEN HEMORR LYMPH EXTRAV INC GUIDE ROADMAPPING; IR LEFT FLOURO GUIDE CV LINE; ADDITIONAL ARTERIOGRAPHY; IR ULTRASOUND GUIDANCE VASC ACCESS LEFT; ARTERIOGRAPHY Date: 01/23/2015 PROCEDURE: 1. Ultrasound-guided puncture left common femoral artery 2. Catheterization of the superior mesenteric artery with arteriogram 3. Catheterization of the celiac artery with arteriogram 4. Catheterization of the gastroduodenal artery with arteriogram 5. Catheterization of the right gastroepiploic artery with arteriogram 6. Coil embolization of the gastroduodenal artery 7. Post embolization arteriogram 8. Limited left common femoral arteriogram 9. Ultrasound-guided puncture of the left common femoral vein 10. Placement of a triple-lumen central venous catheter via the left femoral vein with fluoroscopic guidance Interventional Radiologist:  Criselda Peaches, MD ANESTHESIA/SEDATION: Moderate (conscious) sedation was used. 1 mg Versed, 50 mcg Fentanyl were administered intravenously. The patient's vital signs were monitored continuously by radiology nursing throughout the procedure. Sedation Time: 60 minutes MEDICATIONS: None additional FLUOROSCOPY TIME:  16 minutes for a total of 622 mGy CONTRAST:  140mL OMNIPAQUE IOHEXOL 300 MG/ML  SOLN TECHNIQUE: Informed consent was obtained from the patient following explanation of the procedure, risks, benefits and alternatives. The patient understands, agrees and consents for the procedure. All questions were addressed. A time out was performed. Maximal barrier sterile technique utilized including caps, mask, sterile gowns, sterile gloves, large  sterile drape, hand hygiene, and Betadine skin prep. The left groin was interrogated with ultrasound. The common femoral artery is found to be widely patent. An image was obtained and stored for the medical record. Local anesthesia was attained by infiltration with 1% lidocaine. Under real-time sonographic guidance, the vessel was punctured with a 21 gauge micropuncture needle. Using standard technique, the initial micro wire was exchanged through a  transitional 5 French micro sheath for a working 0.035 Public affairs consultant. The micro sheath was then exchanged for a working 5 [place] vascular sheath. A C2 cobra catheter was advanced into the abdominal aorta over the wire. The catheter was initially advanced into the superior mesenteric artery and superior mesenteric arteriogram was performed. There is conventional anatomy. No replaced or accessory right hepatic artery. No evidence of active extravasation or significant collateralization through the pancreaticoduodenal arcade. The C2 cobra catheter was next advanced into the celiac artery. An arteriogram was performed. The gastroduodenal artery is elongated and slightly irregular in the mid segment likely secondary to external mass effect. There is no evidence of active extravasation. A renegade ST micro catheter was advanced coaxially over a fat and 16 wire. The micro catheter was advanced into the gastroduodenal artery. Arteriography was again performed. There are multiple branches arising from the gastroduodenal artery providing supply to a hypervascular mass in the region the descending duodenum. The micro catheter was carefully advanced more distally into the proximal right gastroepiploic artery. Contrast injection was performed confirming the location of the catheter tip as well as confirming no additional supplied to the region of tumor. Coil embolization was then performed of the gastroduodenal artery using a combination of vortex, standard helical and soft  interlock detachable micro coils ranging in size from 2-6 mm. Post embolization arteriography confirms successful embolization of the vessel with no further visible hypervascular blush of the duodenum tumor. The micro catheter was removed. Repeat celiac artery injection was performed confirming no additional collateral supply. The 5 French catheter was readvanced of the superior mesenteric artery and an additional angiogram was performed again confirming no collateral flow to the embolized territory. The catheters were removed. The vascular sheath was left in place for hemodynamic monitoring. Ultrasound was again used to interrogate the left groin. The left common femoral vein was identified. Local anesthesia was attained by infiltration with 1% lidocaine. The left common femoral vein was punctured with an 18 gauge needle. A wire was advanced into the inferior vena cava. The skin tract was dilated and an arrow triple-lumen central venous catheter advanced over the wire and position with the tip at the confluence of the IVC and left common iliac vein. The catheter was flushed, capped and secured with 0 Prolene suture. Sterile bandages were applied. COMPLICATIONS: None IMPRESSION: 1. No evidence of active extravasation. 2. 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 3. Coil embolization of the gastroduodenal artery. 4. Placement of a left femoral triple-lumen central venous catheter. The catheter tip is at the confluence of the IVC and iliac vein and ready for immediate use. Signed, Criselda Peaches, MD [facility] Rad[facility] Electronically Signed   By: Jacqulynn Cadet M.D.   On: 01/23/2015 15:50    Medications: . sodium chloride   Intravenous Once  . antiseptic oral rinse  7 mL Mouth Rinse QID  . chlorhexidine gluconate  15 mL Mouth Rinse BID  . piperacillin-tazobactam  (ZOSYN)  IV  3.375 g Intravenous 3 times per day    Assessment/Plan Recurrent GI bleed S/p Visceral angiogram shows no active hemorrhage but there is a hypervascular tumor supplied by the GDA.  Coil embolization of the GDA.  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  S/p embolization.  Pathology is Lymphoma and Dr. Jana Hakim is following.    LOS: 1 day    Alisia Vanengen 01/24/2015

## 2015-01-24 NOTE — Progress Notes (Signed)
[facility] Progress Note Patient Name: [name] DOB: 1950/11/09 MRN: [id number]   Date of Service  01/24/2015  HPI/Events of Note  Hypotension - BP = 86/67.   eICU Interventions  Will order: 1. Bolus with 0.9 NaCl IV over 1 hour now. 2. Start IV fluids with 0.9 NaCl to run at 125 mL/hour.      Intervention Category Intermediate Interventions: Hypotension - evaluation and management  Sommer,Steven Eugene 01/24/2015, 2:15 AM

## 2015-01-24 NOTE — Progress Notes (Signed)
Report called to [facility] on 3S.  Night shift will transfer pt to 3S.  Pt is up to chair and all VS WNL.

## 2015-01-24 NOTE — Progress Notes (Signed)
[facility] PROGRESS NOTE Subjective patient received FFP and 3 units of PRC's in the ER and is not required any transfusion since. He still is having old blood coming out of his NG tube. He is currently being weaned from the respirator.  Objective: Vital signs in last 24 hours: Temp:  [95 F (35 C)-99.3 F (37.4 C)] 97.9 F (36.6 C) (12/21 0738) Pulse Rate:  [71-126] 78 (12/21 0745) Resp:  [0-22] 14 (12/21 0745) BP: (62-179)/(51-129) 112/66 mmHg (12/21 0745) SpO2:  [100 %] 100 % (12/21 0745) FiO2 (%):  [40 %-100 %] 40 % (12/21 0745) Weight:  [99 kg (218 lb 4.1 oz)] 99 kg (218 lb 4.1 oz) (12/21 0500) Last BM Date: 01/23/15  Intake/Output from previous day: 12/20 0701 - 12/21 0700 In: 5751.3 [I.V.:4481.3; NG/GT:120; IV Piggyback:1150] Out: 1910 [Urine:1810; Emesis/NG output:100] Intake/Output this shift: Total I/O In: 496.7 [I.V.:496.7] Out: 175 [Urine:175]  PE: General-- NG draining dark material  Lungs-- respirator sounds grossly clear Abdomen-- nondistended soft and nontender  Lab Results:  Recent Labs  01/23/15 0954 01/23/15 1400 01/23/15 1710 01/23/15 2230 01/24/15 0430  WBC 34.7* 16.6* 19.2* 17.0* 14.5*  HGB 10.3* 8.5* 8.6* 8.0* 7.5*  HCT 32.6* 25.3* 25.5* 23.7* 22.1*  PLT 316 215 224 216 207   BMET  Recent Labs  01/22/15 0400 01/23/15 0751 01/23/15 0812 01/24/15 0430  NA 138 147* 142 141  K 3.8 5.7* 5.5* 4.3  CL 106 111 113* 114*  CO2 25 8*  --  21*  CREATININE 0.94 1.71* 1.50* 1.65*   LFT  Recent Labs  01/23/15 0751 01/24/15 0430  PROT 4.5* 4.1*  AST 44* 39  ALT 37 37  ALKPHOS 67 61  BILITOT 0.2* 0.5   PT/INR  Recent Labs  01/23/15 0751  LABPROT 18.4*  INR 1.53*   PANCREAS No results for input(s): LIPASE in the last 72 hours.       Studies/Results: Ct Head Wo Contrast  01/23/2015  CLINICAL DATA:  64 year old male with fall. EXAM: CT HEAD WITHOUT CONTRAST CT CERVICAL SPINE WITHOUT CONTRAST TECHNIQUE:  Multidetector CT imaging of the head and cervical spine was performed following the standard protocol without intravenous contrast. Multiplanar CT image reconstructions of the cervical spine were also generated. COMPARISON:  None. FINDINGS: CT HEAD FINDINGS The ventricles and sulci are appropriate in size for patient's age. Mild periventricular and deep white matter hypodensities represent chronic microvascular ischemic changes. There is no intracranial hemorrhage. No mass effect or midline shift identified. An endotracheal and enteric tube are partially visualized. There is diffuse mucoperiosteal thickening of the paranasal sinuses with partial opacification of the ethmoid air cells. The mastoid air cells are well aerated. Right occipital scalp hematoma. The calvarium is intact. CT CERVICAL SPINE FINDINGS There is no acute fracture or subluxation of the cervical spine.Mild multilevel degenerative changes.The odontoid and spinous processes are intact.There is normal anatomic alignment of the C1-C2 lateral masses. The visualized soft tissues appear unremarkable. An enteric tube and an endotracheal tube are partially visualized. Partially visualized bilateral pleural effusions, right greater than left. There is bilateral supraclavicular and right cervical adenopathy. Upper mediastinal lymphadenopathy noted. Chest CT is recommended for further evaluation. Right supraclavicular soft tissue hemorrhage noted. IMPRESSION: No acute intracranial hemorrhage. 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 No acute/traumatic cervical spine pathology. Bilateral supraclavicular and right cervical as well as mediastinal adenopathy. Further evaluation recommended. Electronically Signed   By: Anner Crete M.D.   On: 01/23/2015 18:27   Ct Cervical  Spine Wo Contrast  01/23/2015  CLINICAL DATA:  64 year old male with fall. EXAM: CT HEAD WITHOUT CONTRAST CT CERVICAL SPINE WITHOUT CONTRAST TECHNIQUE:  Multidetector CT imaging of the head and cervical spine was performed following the standard protocol without intravenous contrast. Multiplanar CT image reconstructions of the cervical spine were also generated. COMPARISON:  None. FINDINGS: CT HEAD FINDINGS The ventricles and sulci are appropriate in size for patient's age. Mild periventricular and deep white matter hypodensities represent chronic microvascular ischemic changes. There is no intracranial hemorrhage. No mass effect or midline shift identified. An endotracheal and enteric tube are partially visualized. There is diffuse mucoperiosteal thickening of the paranasal sinuses with partial opacification of the ethmoid air cells. The mastoid air cells are well aerated. Right occipital scalp hematoma. The calvarium is intact. CT CERVICAL SPINE FINDINGS There is no acute fracture or subluxation of the cervical spine.Mild multilevel degenerative changes.The odontoid and spinous processes are intact.There is normal anatomic alignment of the C1-C2 lateral masses. The visualized soft tissues appear unremarkable. An enteric tube and an endotracheal tube are partially visualized. Partially visualized bilateral pleural effusions, right greater than left. There is bilateral supraclavicular and right cervical adenopathy. Upper mediastinal lymphadenopathy noted. Chest CT is recommended for further evaluation. Right supraclavicular soft tissue hemorrhage noted. IMPRESSION: No acute intracranial hemorrhage. 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 No acute/traumatic cervical spine pathology. Bilateral supraclavicular and right cervical as well as mediastinal adenopathy. Further evaluation recommended. Electronically Signed   By: Anner Crete M.D.   On: 01/23/2015 18:27   Ir Angiogram Visceral Selective  01/23/2015  CLINICAL DATA:  63 year old male presents with acute life-threatening upper GI bleed secondary to an ulcerated mass  encompassing the distal stomach and duodenum. 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 Endoscopic treatment options are limited and surgery is extremely high risk. Patient presents for emergent visceral arteriography and embolization. Additionally, central venous access is required. A triple-lumen central venous catheter will be placed during the course of the procedure. EXAM: SELECTIVE VISCERAL ARTERIOGRAPHY; IR EMBO ART VEN HEMORR LYMPH EXTRAV INC GUIDE ROADMAPPING; IR LEFT FLOURO GUIDE CV LINE; ADDITIONAL ARTERIOGRAPHY; IR ULTRASOUND GUIDANCE VASC ACCESS LEFT; ARTERIOGRAPHY Date: 01/23/2015 PROCEDURE: 1. Ultrasound-guided puncture left common femoral artery 2. Catheterization of the superior mesenteric artery with arteriogram 3. Catheterization of the celiac artery with arteriogram 4. Catheterization of the gastroduodenal artery with arteriogram 5. Catheterization of the right gastroepiploic artery with arteriogram 6. Coil embolization of the gastroduodenal artery 7. Post embolization arteriogram 8. Limited left common femoral arteriogram 9. Ultrasound-guided puncture of the left common femoral vein 10. Placement of a triple-lumen central venous catheter via the left femoral vein with fluoroscopic guidance Interventional Radiologist:  Criselda Peaches, MD ANESTHESIA/SEDATION: Moderate (conscious) sedation was used. 1 mg Versed, 50 mcg Fentanyl were administered intravenously. The patient's vital signs were monitored continuously by radiology nursing throughout the procedure. Sedation Time: 60 minutes MEDICATIONS: None additional FLUOROSCOPY TIME:  16 minutes for a total of 622 mGy CONTRAST:  143mL OMNIPAQUE IOHEXOL 300 MG/ML  SOLN TECHNIQUE: Informed consent was obtained from the patient following explanation of the procedure, risks, benefits and alternatives. The patient understands, agrees and consents for the procedure.  All questions were addressed. A time out was performed. Maximal barrier sterile technique utilized including caps, mask, sterile gowns, sterile gloves, large sterile drape, hand hygiene, and Betadine skin prep. The left groin was interrogated with ultrasound. The common femoral artery is found to  be widely patent. An image was obtained and stored for the medical record. Local anesthesia was attained by infiltration with 1% lidocaine. Under real-time sonographic guidance, the vessel was punctured with a 21 gauge micropuncture needle. Using standard technique, the initial micro wire was exchanged through a transitional 5 [place] micro sheath for a working 0.035 inch Bentson wire. The micro sheath was then exchanged for a working 5 [place] vascular sheath. A C2 cobra catheter was advanced into the abdominal aorta over the wire. The catheter was initially advanced into the superior mesenteric artery and superior mesenteric arteriogram was performed. There is conventional anatomy. No replaced or accessory right hepatic artery. No evidence of active extravasation or significant collateralization through the pancreaticoduodenal arcade. The C2 cobra catheter was next advanced into the celiac artery. An arteriogram was performed. The gastroduodenal artery is elongated and slightly irregular in the mid segment likely secondary to external mass effect. There is no evidence of active extravasation. A renegade ST micro catheter was advanced coaxially over a fat and 16 wire. The micro catheter was advanced into the gastroduodenal artery. Arteriography was again performed. There are multiple branches arising from the gastroduodenal artery providing supply to a hypervascular mass in the region the descending duodenum. The micro catheter was carefully advanced more distally into the proximal right gastroepiploic artery. Contrast injection was performed confirming the location of the catheter tip as well as confirming no additional  supplied to the region of tumor. Coil embolization was then performed of the gastroduodenal artery using a combination of vortex, standard helical and soft interlock detachable micro coils ranging in size from 2-6 mm. Post embolization arteriography confirms successful embolization of the vessel with no further visible hypervascular blush of the duodenum tumor. The micro catheter was removed. Repeat celiac artery injection was performed confirming no additional collateral supply. The 5 French catheter was readvanced of the superior mesenteric artery and an additional angiogram was performed again confirming no collateral flow to the embolized territory. The catheters were removed. The vascular sheath was left in place for hemodynamic monitoring. Ultrasound was again used to interrogate the left groin. The left common femoral vein was identified. Local anesthesia was attained by infiltration with 1% lidocaine. The left common femoral vein was punctured with an 18 gauge needle. A wire was advanced into the inferior vena cava. The skin tract was dilated and an arrow triple-lumen central venous catheter advanced over the wire and position with the tip at the confluence of the IVC and left common iliac vein. The catheter was flushed, capped and secured with 0 Prolene suture. Sterile bandages were applied. COMPLICATIONS: None IMPRESSION: 1. No evidence of active extravasation. 2. 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 3. Coil embolization of the gastroduodenal artery. 4. Placement of a left femoral triple-lumen central venous catheter. The catheter tip is at the confluence of the IVC and iliac vein and ready for immediate use. Signed, Criselda Peaches, MD [facility] Radiology Electronically Signed   By: Jacqulynn Cadet M.D.   On: 01/23/2015 15:50   Ir Angiogram Visceral  Selective  01/23/2015  CLINICAL DATA:  64 year old male presents with acute life-threatening upper GI bleed secondary to an ulcerated mass encompassing the distal stomach and duodenum. 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 Endoscopic treatment options are limited and surgery is extremely high risk. Patient presents for emergent visceral  arteriography and embolization. Additionally, central venous access is required. A triple-lumen central venous catheter will be placed during the course of the procedure. EXAM: SELECTIVE VISCERAL ARTERIOGRAPHY; IR EMBO ART VEN HEMORR LYMPH EXTRAV INC GUIDE ROADMAPPING; IR LEFT FLOURO GUIDE CV LINE; ADDITIONAL ARTERIOGRAPHY; IR ULTRASOUND GUIDANCE VASC ACCESS LEFT; ARTERIOGRAPHY Date: 01/23/2015 PROCEDURE: 1. Ultrasound-guided puncture left common femoral artery 2. Catheterization of the superior mesenteric artery with arteriogram 3. Catheterization of the celiac artery with arteriogram 4. Catheterization of the gastroduodenal artery with arteriogram 5. Catheterization of the right gastroepiploic artery with arteriogram 6. Coil embolization of the gastroduodenal artery 7. Post embolization arteriogram 8. Limited left common femoral arteriogram 9. Ultrasound-guided puncture of the left common femoral vein 10. Placement of a triple-lumen central venous catheter via the left femoral vein with fluoroscopic guidance Interventional Radiologist:  Criselda Peaches, MD ANESTHESIA/SEDATION: Moderate (conscious) sedation was used. 1 mg Versed, 50 mcg Fentanyl were administered intravenously. The patient's vital signs were monitored continuously by radiology nursing throughout the procedure. Sedation Time: 60 minutes MEDICATIONS: None additional FLUOROSCOPY TIME:  16 minutes for a total of 622 mGy CONTRAST:  150mL OMNIPAQUE IOHEXOL 300 MG/ML  SOLN TECHNIQUE: Informed consent was obtained from the  patient following explanation of the procedure, risks, benefits and alternatives. The patient understands, agrees and consents for the procedure. All questions were addressed. A time out was performed. Maximal barrier sterile technique utilized including caps, mask, sterile gowns, sterile gloves, large sterile drape, hand hygiene, and Betadine skin prep. The left groin was interrogated with ultrasound. The common femoral artery is found to be widely patent. An image was obtained and stored for the medical record. Local anesthesia was attained by infiltration with 1% lidocaine. Under real-time sonographic guidance, the vessel was punctured with a 21 gauge micropuncture needle. Using standard technique, the initial micro wire was exchanged through a transitional 5 [place] micro sheath for a working 0.035 inch Bentson wire. The micro sheath was then exchanged for a working 5 [place] vascular sheath. A C2 cobra catheter was advanced into the abdominal aorta over the wire. The catheter was initially advanced into the superior mesenteric artery and superior mesenteric arteriogram was performed. There is conventional anatomy. No replaced or accessory right hepatic artery. No evidence of active extravasation or significant collateralization through the pancreaticoduodenal arcade. The C2 cobra catheter was next advanced into the celiac artery. An arteriogram was performed. The gastroduodenal artery is elongated and slightly irregular in the mid segment likely secondary to external mass effect. There is no evidence of active extravasation. A renegade ST micro catheter was advanced coaxially over a fat and 16 wire. The micro catheter was advanced into the gastroduodenal artery. Arteriography was again performed. There are multiple branches arising from the gastroduodenal artery providing supply to a hypervascular mass in the region the descending duodenum. The micro catheter was carefully advanced more distally into the proximal  right gastroepiploic artery. Contrast injection was performed confirming the location of the catheter tip as well as confirming no additional supplied to the region of tumor. Coil embolization was then performed of the gastroduodenal artery using a combination of vortex, standard helical and soft interlock detachable micro coils ranging in size from 2-6 mm. Post embolization arteriography confirms successful embolization of the vessel with no further visible hypervascular blush of the duodenum tumor. The micro catheter was removed. Repeat celiac artery injection was performed confirming no additional collateral supply. The 5 French catheter was readvanced of the superior mesenteric artery and an additional angiogram was  performed again confirming no collateral flow to the embolized territory. The catheters were removed. The vascular sheath was left in place for hemodynamic monitoring. Ultrasound was again used to interrogate the left groin. The left common femoral vein was identified. Local anesthesia was attained by infiltration with 1% lidocaine. The left common femoral vein was punctured with an 18 gauge needle. A wire was advanced into the inferior vena cava. The skin tract was dilated and an arrow triple-lumen central venous catheter advanced over the wire and position with the tip at the confluence of the IVC and left common iliac vein. The catheter was flushed, capped and secured with 0 Prolene suture. Sterile bandages were applied. COMPLICATIONS: None IMPRESSION: 1. No evidence of active extravasation. 2. 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 3. Coil embolization of the gastroduodenal artery. 4. Placement of a left femoral triple-lumen central venous catheter. The catheter tip is at the confluence of the IVC and iliac vein and ready for immediate use. Signed, Criselda Peaches, MD [facility] Radiology Electronically Signed   By: Jacqulynn Cadet M.D.   On: 01/23/2015 15:50   Ir Angiogram Selective Each Additional Vessel  01/23/2015  CLINICAL DATA:  64 year old male presents with acute life-threatening upper GI bleed secondary to an ulcerated mass encompassing the distal stomach and duodenum. 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 Endoscopic treatment options are limited and surgery is extremely high risk. Patient presents for emergent visceral arteriography and embolization. Additionally, central venous access is required. A triple-lumen central venous catheter will be placed during the course of the procedure. EXAM: SELECTIVE VISCERAL ARTERIOGRAPHY; IR EMBO ART VEN HEMORR LYMPH EXTRAV INC GUIDE ROADMAPPING; IR LEFT FLOURO GUIDE CV LINE; ADDITIONAL ARTERIOGRAPHY; IR ULTRASOUND GUIDANCE VASC ACCESS LEFT; ARTERIOGRAPHY Date: 01/23/2015 PROCEDURE: 1. Ultrasound-guided puncture left common femoral artery 2. Catheterization of the superior mesenteric artery with arteriogram 3. Catheterization of the celiac artery with arteriogram 4. Catheterization of the gastroduodenal artery with arteriogram 5. Catheterization of the right gastroepiploic artery with arteriogram 6. Coil embolization of the gastroduodenal artery 7. Post embolization arteriogram 8. Limited left common femoral arteriogram 9. Ultrasound-guided puncture of the left common femoral vein 10. Placement of a triple-lumen central venous catheter via the left femoral vein with fluoroscopic guidance Interventional Radiologist:  Criselda Peaches, MD ANESTHESIA/SEDATION: Moderate (conscious) sedation was used. 1 mg Versed, 50 mcg Fentanyl were administered intravenously. The patient's vital signs were monitored continuously by radiology nursing throughout the procedure. Sedation Time: 60 minutes MEDICATIONS: None additional  FLUOROSCOPY TIME:  16 minutes for a total of 622 mGy CONTRAST:  163mL OMNIPAQUE IOHEXOL 300 MG/ML  SOLN TECHNIQUE: Informed consent was obtained from the patient following explanation of the procedure, risks, benefits and alternatives. The patient understands, agrees and consents for the procedure. All questions were addressed. A time out was performed. Maximal barrier sterile technique utilized including caps, mask, sterile gowns, sterile gloves, large sterile drape, hand hygiene, and Betadine skin prep. The left groin was interrogated with ultrasound. The common femoral artery is found to be widely patent. An image was obtained and stored for the medical record. Local anesthesia was attained by infiltration with 1% lidocaine. Under real-time sonographic guidance, the vessel was punctured with a 21 gauge micropuncture needle. Using standard technique, the initial micro wire was exchanged through a transitional 5 [place] micro sheath for a working 0.035  inch Bentson wire. The micro sheath was then exchanged for a working 5 [place] vascular sheath. A C2 cobra catheter was advanced into the abdominal aorta over the wire. The catheter was initially advanced into the superior mesenteric artery and superior mesenteric arteriogram was performed. There is conventional anatomy. No replaced or accessory right hepatic artery. No evidence of active extravasation or significant collateralization through the pancreaticoduodenal arcade. The C2 cobra catheter was next advanced into the celiac artery. An arteriogram was performed. The gastroduodenal artery is elongated and slightly irregular in the mid segment likely secondary to external mass effect. There is no evidence of active extravasation. A renegade ST micro catheter was advanced coaxially over a fat and 16 wire. The micro catheter was advanced into the gastroduodenal artery. Arteriography was again performed. There are multiple branches arising from the gastroduodenal artery  providing supply to a hypervascular mass in the region the descending duodenum. The micro catheter was carefully advanced more distally into the proximal right gastroepiploic artery. Contrast injection was performed confirming the location of the catheter tip as well as confirming no additional supplied to the region of tumor. Coil embolization was then performed of the gastroduodenal artery using a combination of vortex, standard helical and soft interlock detachable micro coils ranging in size from 2-6 mm. Post embolization arteriography confirms successful embolization of the vessel with no further visible hypervascular blush of the duodenum tumor. The micro catheter was removed. Repeat celiac artery injection was performed confirming no additional collateral supply. The 5 French catheter was readvanced of the superior mesenteric artery and an additional angiogram was performed again confirming no collateral flow to the embolized territory. The catheters were removed. The vascular sheath was left in place for hemodynamic monitoring. Ultrasound was again used to interrogate the left groin. The left common femoral vein was identified. Local anesthesia was attained by infiltration with 1% lidocaine. The left common femoral vein was punctured with an 18 gauge needle. A wire was advanced into the inferior vena cava. The skin tract was dilated and an arrow triple-lumen central venous catheter advanced over the wire and position with the tip at the confluence of the IVC and left common iliac vein. The catheter was flushed, capped and secured with 0 Prolene suture. Sterile bandages were applied. COMPLICATIONS: None IMPRESSION: 1. No evidence of active extravasation. 2. 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 3. Coil embolization of the gastroduodenal artery. 4. Placement of a left femoral triple-lumen central venous catheter. The  catheter tip is at the confluence of the IVC and iliac vein and ready for immediate use. Signed, Criselda Peaches, MD [facility] Radiology Electronically Signed   By: Jacqulynn Cadet M.D.   On: 01/23/2015 15:50   Ir Angiogram Follow Up Study  01/23/2015  CLINICAL DATA:  64 year old male presents with acute life-threatening upper GI bleed secondary to an ulcerated mass encompassing the distal stomach and duodenum. 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 Endoscopic treatment options are limited and surgery is extremely high risk. Patient presents for emergent visceral arteriography and embolization. Additionally, central venous access is required. A triple-lumen central venous catheter will be placed during the course of the procedure. EXAM: SELECTIVE VISCERAL ARTERIOGRAPHY; IR EMBO ART VEN HEMORR LYMPH EXTRAV INC GUIDE ROADMAPPING; IR LEFT FLOURO GUIDE CV LINE; ADDITIONAL ARTERIOGRAPHY; IR ULTRASOUND GUIDANCE VASC ACCESS LEFT; ARTERIOGRAPHY Date: 01/23/2015 PROCEDURE:  1. Ultrasound-guided puncture left common femoral artery 2. Catheterization of the superior mesenteric artery with arteriogram 3. Catheterization of the celiac artery with arteriogram 4. Catheterization of the gastroduodenal artery with arteriogram 5. Catheterization of the right gastroepiploic artery with arteriogram 6. Coil embolization of the gastroduodenal artery 7. Post embolization arteriogram 8. Limited left common femoral arteriogram 9. Ultrasound-guided puncture of the left common femoral vein 10. Placement of a triple-lumen central venous catheter via the left femoral vein with fluoroscopic guidance Interventional Radiologist:  Criselda Peaches, MD ANESTHESIA/SEDATION: Moderate (conscious) sedation was used. 1 mg Versed, 50 mcg Fentanyl were administered intravenously. The patient's vital signs  were monitored continuously by radiology nursing throughout the procedure. Sedation Time: 60 minutes MEDICATIONS: None additional FLUOROSCOPY TIME:  16 minutes for a total of 622 mGy CONTRAST:  1100mL OMNIPAQUE IOHEXOL 300 MG/ML  SOLN TECHNIQUE: Informed consent was obtained from the patient following explanation of the procedure, risks, benefits and alternatives. The patient understands, agrees and consents for the procedure. All questions were addressed. A time out was performed. Maximal barrier sterile technique utilized including caps, mask, sterile gowns, sterile gloves, large sterile drape, hand hygiene, and Betadine skin prep. The left groin was interrogated with ultrasound. The common femoral artery is found to be widely patent. An image was obtained and stored for the medical record. Local anesthesia was attained by infiltration with 1% lidocaine. Under real-time sonographic guidance, the vessel was punctured with a 21 gauge micropuncture needle. Using standard technique, the initial micro wire was exchanged through a transitional 5 [place] micro sheath for a working 0.035 inch Bentson wire. The micro sheath was then exchanged for a working 5 [place] vascular sheath. A C2 cobra catheter was advanced into the abdominal aorta over the wire. The catheter was initially advanced into the superior mesenteric artery and superior mesenteric arteriogram was performed. There is conventional anatomy. No replaced or accessory right hepatic artery. No evidence of active extravasation or significant collateralization through the pancreaticoduodenal arcade. The C2 cobra catheter was next advanced into the celiac artery. An arteriogram was performed. The gastroduodenal artery is elongated and slightly irregular in the mid segment likely secondary to external mass effect. There is no evidence of active extravasation. A renegade ST micro catheter was advanced coaxially over a fat and 16 wire. The micro catheter was advanced  into the gastroduodenal artery. Arteriography was again performed. There are multiple branches arising from the gastroduodenal artery providing supply to a hypervascular mass in the region the descending duodenum. The micro catheter was carefully advanced more distally into the proximal right gastroepiploic artery. Contrast injection was performed confirming the location of the catheter tip as well as confirming no additional supplied to the region of tumor. Coil embolization was then performed of the gastroduodenal artery using a combination of vortex, standard helical and soft interlock detachable micro coils ranging in size from 2-6 mm. Post embolization arteriography confirms successful embolization of the vessel with no further visible hypervascular blush of the duodenum tumor. The micro catheter was removed. Repeat celiac artery injection was performed confirming no additional collateral supply. The 5 French catheter was readvanced of the superior mesenteric artery and an additional angiogram was performed again confirming no collateral flow to the embolized territory. The catheters were removed. The vascular sheath was left in place for hemodynamic monitoring. Ultrasound was again used to interrogate the left groin. The left common femoral vein was identified. Local anesthesia was attained by infiltration with 1% lidocaine. The left common femoral vein was  punctured with an 18 gauge needle. A wire was advanced into the inferior vena cava. The skin tract was dilated and an arrow triple-lumen central venous catheter advanced over the wire and position with the tip at the confluence of the IVC and left common iliac vein. The catheter was flushed, capped and secured with 0 Prolene suture. Sterile bandages were applied. COMPLICATIONS: None IMPRESSION: 1. No evidence of active extravasation. 2. Positive hypervascular tumor blush in the region of the descending duodenum with arterial supply from the gastroduodenal  artery. 3. Coil embolization of the gastroduodenal artery. 4. Placement of a left femoral triple-lumen central venous catheter. The catheter tip is at the confluence of the IVC and iliac vein and ready for immediate use. Signed, Criselda Peaches, MD [facility] Radiology Electronically Signed   By: Jacqulynn Cadet M.D.   On: 01/23/2015 15:50   Ir Fluoro Guide Cv Line Left  01/23/2015  CLINICAL DATA:  64 year old male presents with acute life-threatening upper GI bleed secondary to an ulcerated mass encompassing the distal stomach and duodenum. 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 Endoscopic treatment options are limited and surgery is extremely high risk. Patient presents for emergent visceral arteriography and embolization. Additionally, central venous access is required. A triple-lumen central venous catheter will be placed during the course of the procedure. EXAM: SELECTIVE VISCERAL ARTERIOGRAPHY; IR EMBO ART VEN HEMORR LYMPH EXTRAV INC GUIDE ROADMAPPING; IR LEFT FLOURO GUIDE CV LINE; ADDITIONAL ARTERIOGRAPHY; IR ULTRASOUND GUIDANCE VASC ACCESS LEFT; ARTERIOGRAPHY Date: 01/23/2015 PROCEDURE: 1. Ultrasound-guided puncture left common femoral artery 2. Catheterization of the superior mesenteric artery with arteriogram 3. Catheterization of the celiac artery with arteriogram 4. Catheterization of the gastroduodenal artery with arteriogram 5. Catheterization of the right gastroepiploic artery with arteriogram 6. Coil embolization of the gastroduodenal artery 7. Post embolization arteriogram 8. Limited left common femoral arteriogram 9. Ultrasound-guided puncture of the left common femoral vein 10. Placement of a triple-lumen central venous catheter via the left femoral vein with fluoroscopic guidance Interventional Radiologist:  Criselda Peaches, MD  ANESTHESIA/SEDATION: Moderate (conscious) sedation was used. 1 mg Versed, 50 mcg Fentanyl were administered intravenously. The patient's vital signs were monitored continuously by radiology nursing throughout the procedure. Sedation Time: 60 minutes MEDICATIONS: None additional FLUOROSCOPY TIME:  16 minutes for a total of 622 mGy CONTRAST:  152mL OMNIPAQUE IOHEXOL 300 MG/ML  SOLN TECHNIQUE: Informed consent was obtained from the patient following explanation of the procedure, risks, benefits and alternatives. The patient understands, agrees and consents for the procedure. All questions were addressed. A time out was performed. Maximal barrier sterile technique utilized including caps, mask, sterile gowns, sterile gloves, large sterile drape, hand hygiene, and Betadine skin prep. The left groin was interrogated with ultrasound. The common femoral artery is found to be widely patent. An image was obtained and stored for the medical record. Local anesthesia was attained by infiltration with 1% lidocaine. Under real-time sonographic guidance, the vessel was punctured with a 21 gauge micropuncture needle. Using standard technique, the initial micro wire was exchanged through a transitional 5 [place] micro sheath for a working 0.035 inch Bentson wire. The micro sheath was then exchanged for a working 5 [place] vascular sheath. A C2 cobra catheter was advanced into the abdominal aorta over the wire. The catheter was initially advanced into the superior mesenteric artery and superior mesenteric arteriogram was performed. There is conventional anatomy. No replaced or accessory right hepatic  artery. No evidence of active extravasation or significant collateralization through the pancreaticoduodenal arcade. The C2 cobra catheter was next advanced into the celiac artery. An arteriogram was performed. The gastroduodenal artery is elongated and slightly irregular in the mid segment likely secondary to external mass effect. There is  no evidence of active extravasation. A renegade ST micro catheter was advanced coaxially over a fat and 16 wire. The micro catheter was advanced into the gastroduodenal artery. Arteriography was again performed. There are multiple branches arising from the gastroduodenal artery providing supply to a hypervascular mass in the region the descending duodenum. The micro catheter was carefully advanced more distally into the proximal right gastroepiploic artery. Contrast injection was performed confirming the location of the catheter tip as well as confirming no additional supplied to the region of tumor. Coil embolization was then performed of the gastroduodenal artery using a combination of vortex, standard helical and soft interlock detachable micro coils ranging in size from 2-6 mm. Post embolization arteriography confirms successful embolization of the vessel with no further visible hypervascular blush of the duodenum tumor. The micro catheter was removed. Repeat celiac artery injection was performed confirming no additional collateral supply. The 5 French catheter was readvanced of the superior mesenteric artery and an additional angiogram was performed again confirming no collateral flow to the embolized territory. The catheters were removed. The vascular sheath was left in place for hemodynamic monitoring. Ultrasound was again used to interrogate the left groin. The left common femoral vein was identified. Local anesthesia was attained by infiltration with 1% lidocaine. The left common femoral vein was punctured with an 18 gauge needle. A wire was advanced into the inferior vena cava. The skin tract was dilated and an arrow triple-lumen central venous catheter advanced over the wire and position with the tip at the confluence of the IVC and left common iliac vein. The catheter was flushed, capped and secured with 0 Prolene suture. Sterile bandages were applied. COMPLICATIONS: None IMPRESSION: 1. No evidence of  active extravasation. 2. 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 3. Coil embolization of the gastroduodenal artery. 4. Placement of a left femoral triple-lumen central venous catheter. The catheter tip is at the confluence of the IVC and iliac vein and ready for immediate use. Signed, Criselda Peaches, MD [facility] Radiology Electronically Signed   By: Jacqulynn Cadet M.D.   On: 01/23/2015 15:50   Ir US Guide Vasc Access Left  01/23/2015  CLINICAL DATA:  64 year old male presents with acute life-threatening upper GI bleed secondary to an ulcerated mass encompassing the distal stomach and duodenum. 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 Endoscopic treatment options are limited and surgery is extremely high risk. Patient presents for emergent visceral arteriography and embolization. Additionally, central venous access is required. A triple-lumen central venous catheter will be placed during the course of the procedure. EXAM: SELECTIVE VISCERAL ARTERIOGRAPHY; IR EMBO ART VEN HEMORR LYMPH EXTRAV INC GUIDE ROADMAPPING; IR LEFT FLOURO GUIDE CV LINE; ADDITIONAL ARTERIOGRAPHY; IR ULTRASOUND GUIDANCE VASC ACCESS LEFT; ARTERIOGRAPHY Date: 01/23/2015 PROCEDURE: 1. Ultrasound-guided puncture left common femoral artery 2. Catheterization of the superior mesenteric artery with arteriogram 3. Catheterization of the celiac artery with arteriogram 4. Catheterization of the gastroduodenal artery with arteriogram 5. Catheterization of the right gastroepiploic artery with arteriogram 6. Coil embolization of the gastroduodenal artery 7. Post embolization arteriogram 8. Limited  left common femoral arteriogram 9. Ultrasound-guided puncture of the left common femoral vein 10. Placement of a triple-lumen  central venous catheter via the left femoral vein with fluoroscopic guidance Interventional Radiologist:  Criselda Peaches, MD ANESTHESIA/SEDATION: Moderate (conscious) sedation was used. 1 mg Versed, 50 mcg Fentanyl were administered intravenously. The patient's vital signs were monitored continuously by radiology nursing throughout the procedure. Sedation Time: 60 minutes MEDICATIONS: None additional FLUOROSCOPY TIME:  16 minutes for a total of 622 mGy CONTRAST:  138mL OMNIPAQUE IOHEXOL 300 MG/ML  SOLN TECHNIQUE: Informed consent was obtained from the patient following explanation of the procedure, risks, benefits and alternatives. The patient understands, agrees and consents for the procedure. All questions were addressed. A time out was performed. Maximal barrier sterile technique utilized including caps, mask, sterile gowns, sterile gloves, large sterile drape, hand hygiene, and Betadine skin prep. The left groin was interrogated with ultrasound. The common femoral artery is found to be widely patent. An image was obtained and stored for the medical record. Local anesthesia was attained by infiltration with 1% lidocaine. Under real-time sonographic guidance, the vessel was punctured with a 21 gauge micropuncture needle. Using standard technique, the initial micro wire was exchanged through a transitional 5 [place] micro sheath for a working 0.035 inch Bentson wire. The micro sheath was then exchanged for a working 5 [place] vascular sheath. A C2 cobra catheter was advanced into the abdominal aorta over the wire. The catheter was initially advanced into the superior mesenteric artery and superior mesenteric arteriogram was performed. There is conventional anatomy. No replaced or accessory right hepatic artery. No evidence of active extravasation or significant collateralization through the pancreaticoduodenal arcade. The C2 cobra catheter was next advanced into the celiac artery. An arteriogram was performed.  The gastroduodenal artery is elongated and slightly irregular in the mid segment likely secondary to external mass effect. There is no evidence of active extravasation. A renegade ST micro catheter was advanced coaxially over a fat and 16 wire. The micro catheter was advanced into the gastroduodenal artery. Arteriography was again performed. There are multiple branches arising from the gastroduodenal artery providing supply to a hypervascular mass in the region the descending duodenum. The micro catheter was carefully advanced more distally into the proximal right gastroepiploic artery. Contrast injection was performed confirming the location of the catheter tip as well as confirming no additional supplied to the region of tumor. Coil embolization was then performed of the gastroduodenal artery using a combination of vortex, standard helical and soft interlock detachable micro coils ranging in size from 2-6 mm. Post embolization arteriography confirms successful embolization of the vessel with no further visible hypervascular blush of the duodenum tumor. The micro catheter was removed. Repeat celiac artery injection was performed confirming no additional collateral supply. The 5 French catheter was readvanced of the superior mesenteric artery and an additional angiogram was performed again confirming no collateral flow to the embolized territory. The catheters were removed. The vascular sheath was left in place for hemodynamic monitoring. Ultrasound was again used to interrogate the left groin. The left common femoral vein was identified. Local anesthesia was attained by infiltration with 1% lidocaine. The left common femoral vein was punctured with an 18 gauge needle. A wire was advanced into the inferior vena cava. The skin tract was dilated and an arrow triple-lumen central venous catheter advanced over the wire and position with the tip at the confluence of the IVC and left common iliac vein. The catheter was  flushed, capped and  secured with 0 Prolene suture. Sterile bandages were applied. COMPLICATIONS: None IMPRESSION: 1. No evidence of active extravasation. 2. 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 3. Coil embolization of the gastroduodenal artery. 4. Placement of a left femoral triple-lumen central venous catheter. The catheter tip is at the confluence of the IVC and iliac vein and ready for immediate use. Signed, Criselda Peaches, MD [facility] Radiology Electronically Signed   By: Jacqulynn Cadet M.D.   On: 01/23/2015 15:50   Ir US Guide Vasc Access Left  01/23/2015  CLINICAL DATA:  64 year old male presents with acute life-threatening upper GI bleed secondary to an ulcerated mass encompassing the distal stomach and duodenum. 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 Endoscopic treatment options are limited and surgery is extremely high risk. Patient presents for emergent visceral arteriography and embolization. Additionally, central venous access is required. A triple-lumen central venous catheter will be placed during the course of the procedure. EXAM: SELECTIVE VISCERAL ARTERIOGRAPHY; IR EMBO ART VEN HEMORR LYMPH EXTRAV INC GUIDE ROADMAPPING; IR LEFT FLOURO GUIDE CV LINE; ADDITIONAL ARTERIOGRAPHY; IR ULTRASOUND GUIDANCE VASC ACCESS LEFT; ARTERIOGRAPHY Date: 01/23/2015 PROCEDURE: 1. Ultrasound-guided puncture left common femoral artery 2. Catheterization of the superior mesenteric artery with arteriogram 3. Catheterization of the celiac artery with arteriogram 4. Catheterization of the gastroduodenal artery with arteriogram 5. Catheterization of the right gastroepiploic artery with arteriogram 6. Coil embolization of the gastroduodenal artery 7. Post embolization arteriogram 8.  Limited left common femoral arteriogram 9. Ultrasound-guided puncture of the left common femoral vein 10. Placement of a triple-lumen central venous catheter via the left femoral vein with fluoroscopic guidance Interventional Radiologist:  Criselda Peaches, MD ANESTHESIA/SEDATION: Moderate (conscious) sedation was used. 1 mg Versed, 50 mcg Fentanyl were administered intravenously. The patient's vital signs were monitored continuously by radiology nursing throughout the procedure. Sedation Time: 60 minutes MEDICATIONS: None additional FLUOROSCOPY TIME:  16 minutes for a total of 622 mGy CONTRAST:  162mL OMNIPAQUE IOHEXOL 300 MG/ML  SOLN TECHNIQUE: Informed consent was obtained from the patient following explanation of the procedure, risks, benefits and alternatives. The patient understands, agrees and consents for the procedure. All questions were addressed. A time out was performed. Maximal barrier sterile technique utilized including caps, mask, sterile gowns, sterile gloves, large sterile drape, hand hygiene, and Betadine skin prep. The left groin was interrogated with ultrasound. The common femoral artery is found to be widely patent. An image was obtained and stored for the medical record. Local anesthesia was attained by infiltration with 1% lidocaine. Under real-time sonographic guidance, the vessel was punctured with a 21 gauge micropuncture needle. Using standard technique, the initial micro wire was exchanged through a transitional 5 [place] micro sheath for a working 0.035 inch Bentson wire. The micro sheath was then exchanged for a working 5 [place] vascular sheath. A C2 cobra catheter was advanced into the abdominal aorta over the wire. The catheter was initially advanced into the superior mesenteric artery and superior mesenteric arteriogram was performed. There is conventional anatomy. No replaced or accessory right hepatic artery. No evidence of active extravasation or significant collateralization  through the pancreaticoduodenal arcade. The C2 cobra catheter was next advanced into the celiac artery. An arteriogram was performed. The gastroduodenal artery is elongated and slightly irregular in the mid segment likely secondary to external mass effect. There is no evidence of active extravasation.  A renegade ST micro catheter was advanced coaxially over a fat and 16 wire. The micro catheter was advanced into the gastroduodenal artery. Arteriography was again performed. There are multiple branches arising from the gastroduodenal artery providing supply to a hypervascular mass in the region the descending duodenum. The micro catheter was carefully advanced more distally into the proximal right gastroepiploic artery. Contrast injection was performed confirming the location of the catheter tip as well as confirming no additional supplied to the region of tumor. Coil embolization was then performed of the gastroduodenal artery using a combination of vortex, standard helical and soft interlock detachable micro coils ranging in size from 2-6 mm. Post embolization arteriography confirms successful embolization of the vessel with no further visible hypervascular blush of the duodenum tumor. The micro catheter was removed. Repeat celiac artery injection was performed confirming no additional collateral supply. The 5 French catheter was readvanced of the superior mesenteric artery and an additional angiogram was performed again confirming no collateral flow to the embolized territory. The catheters were removed. The vascular sheath was left in place for hemodynamic monitoring. Ultrasound was again used to interrogate the left groin. The left common femoral vein was identified. Local anesthesia was attained by infiltration with 1% lidocaine. The left common femoral vein was punctured with an 18 gauge needle. A wire was advanced into the inferior vena cava. The skin tract was dilated and an arrow triple-lumen central venous  catheter advanced over the wire and position with the tip at the confluence of the IVC and left common iliac vein. The catheter was flushed, capped and secured with 0 Prolene suture. Sterile bandages were applied. COMPLICATIONS: None IMPRESSION: 1. No evidence of active extravasation. 2. 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 3. Coil embolization of the gastroduodenal artery. 4. Placement of a left femoral triple-lumen central venous catheter. The catheter tip is at the confluence of the IVC and iliac vein and ready for immediate use. Signed, Criselda Peaches, MD [facility] Radiology Electronically Signed   By: Jacqulynn Cadet M.D.   On: 01/23/2015 15:50   Portable Chest Xray  01/24/2015  CLINICAL DATA:  Respiratory failure. EXAM: PORTABLE CHEST 1 VIEW COMPARISON:  01/23/2015 FINDINGS: Endotracheal tube and NG tube in stable position. Cardiomegaly with normal pulmonary vascularity.Persistent increased density noted over the right Lower lung, most likely layering pleural effusion. Underlying pulmonary infiltrate cannot be excluded. Small left pleural effusion cannot be excluded. Low lung volumes basilar atelectasis. No pneumothorax. IMPRESSION: 1. Lines and tubes in stable position. 2. Persistent increased density of the right lower lung, most likely layering pleural effusion. Underlying infiltrate cannot be excluded. 3. Low lung volumes with basilar atelectasis. Electronically Signed   By: Marcello Moores  Register   On: 01/24/2015 07:39   Dg Chest Portable 1 View  01/23/2015  CLINICAL DATA:  Assess orogastric tube positioning EXAM: PORTABLE CHEST 1 VIEW COMPARISON:  Portable chest x-ray of earlier today at 8:01 a.m. FINDINGS: The esophagogastric tube tip projects below the inferior margin of the image. There is no evidence of cord ongoing within the esophagus. The  endotracheal tube tip lies 4.1 cm above the carina. The cardiac silhouette is mildly enlarged but stable. The central pulmonary vascularity is engorged especially on the right. There is mild to moderate pulmonary interstitial edema on the right. There is no pleural effusion or pneumothorax. The observed bony thorax exhibits no acute abnormality. External pacemaker -defibrillator  pads. Are present IMPRESSION: 1. The esophagogastric tube tip projects below the inferior margin of the image with no evidence of coiling within the esophagus. The endotracheal tube is in appropriate position. 2. Persistently increased perihilar interstitial density especially on the right consistent with pulmonary interstitial edema. There may be pleural fluid on the right layering posteriorly contributing to the findings here. Electronically Signed   By: David  Martinique M.D.   On: 01/23/2015 09:22   Dg Chest Port 1 View  01/23/2015  CLINICAL DATA:  Status post intubation.  Intra abdominal mass. EXAM: PORTABLE CHEST 1 VIEW COMPARISON:  CT chest 01/21/2015.  CT abdomen 01/21/2015. FINDINGS: The patient has a new endotracheal tube in place with the tip 3.5 cm above the carina. Hazy opacity over the right chest is consistent with layering pleural effusion. Smaller left pleural effusion is noted. No consolidative process or pneumothorax. Heart size is upper normal. IMPRESSION: ET tube in good position with the tip projecting 3.5 cm above the carina. Right greater than left pleural effusions. Electronically Signed   By: Inge Rise M.D.   On: 01/23/2015 08:19   [place] Guide Roadmapping  01/23/2015  CLINICAL DATA:  64 year old male presents with acute life-threatening upper GI bleed secondary to an ulcerated mass encompassing the distal stomach and duodenum. 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 Endoscopic  treatment options are limited and surgery is extremely high risk. Patient presents for emergent visceral arteriography and embolization. Additionally, central venous access is required. A triple-lumen central venous catheter will be placed during the course of the procedure. EXAM: SELECTIVE VISCERAL ARTERIOGRAPHY; IR EMBO ART VEN HEMORR LYMPH EXTRAV INC GUIDE ROADMAPPING; IR LEFT FLOURO GUIDE CV LINE; ADDITIONAL ARTERIOGRAPHY; IR ULTRASOUND GUIDANCE VASC ACCESS LEFT; ARTERIOGRAPHY Date: 01/23/2015 PROCEDURE: 1. Ultrasound-guided puncture left common femoral artery 2. Catheterization of the superior mesenteric artery with arteriogram 3. Catheterization of the celiac artery with arteriogram 4. Catheterization of the gastroduodenal artery with arteriogram 5. Catheterization of the right gastroepiploic artery with arteriogram 6. Coil embolization of the gastroduodenal artery 7. Post embolization arteriogram 8. Limited left common femoral arteriogram 9. Ultrasound-guided puncture of the left common femoral vein 10. Placement of a triple-lumen central venous catheter via the left femoral vein with fluoroscopic guidance Interventional Radiologist:  Criselda Peaches, MD ANESTHESIA/SEDATION: Moderate (conscious) sedation was used. 1 mg Versed, 50 mcg Fentanyl were administered intravenously. The patient's vital signs were monitored continuously by radiology nursing throughout the procedure. Sedation Time: 60 minutes MEDICATIONS: None additional FLUOROSCOPY TIME:  16 minutes for a total of 622 mGy CONTRAST:  143mL OMNIPAQUE IOHEXOL 300 MG/ML  SOLN TECHNIQUE: Informed consent was obtained from the patient following explanation of the procedure, risks, benefits and alternatives. The patient understands, agrees and consents for the procedure. All questions were addressed. A time out was performed. Maximal barrier sterile technique utilized including caps, mask, sterile gowns, sterile gloves, large sterile drape, hand hygiene,  and Betadine skin prep. The left groin was interrogated with ultrasound. The common femoral artery is found to be widely patent. An image was obtained and stored for the medical record. Local anesthesia was attained by infiltration with 1% lidocaine. Under real-time sonographic guidance, the vessel was punctured with a 21 gauge micropuncture needle. Using standard technique, the initial micro wire was exchanged through a transitional 5 [place] micro sheath for a working 0.035 inch Bentson wire. The micro sheath was then exchanged  for a working 5 [place] vascular sheath. A C2 cobra catheter was advanced into the abdominal aorta over the wire. The catheter was initially advanced into the superior mesenteric artery and superior mesenteric arteriogram was performed. There is conventional anatomy. No replaced or accessory right hepatic artery. No evidence of active extravasation or significant collateralization through the pancreaticoduodenal arcade. The C2 cobra catheter was next advanced into the celiac artery. An arteriogram was performed. The gastroduodenal artery is elongated and slightly irregular in the mid segment likely secondary to external mass effect. There is no evidence of active extravasation. A renegade ST micro catheter was advanced coaxially over a fat and 16 wire. The micro catheter was advanced into the gastroduodenal artery. Arteriography was again performed. There are multiple branches arising from the gastroduodenal artery providing supply to a hypervascular mass in the region the descending duodenum. The micro catheter was carefully advanced more distally into the proximal right gastroepiploic artery. Contrast injection was performed confirming the location of the catheter tip as well as confirming no additional supplied to the region of tumor. Coil embolization was then performed of the gastroduodenal artery using a combination of vortex, standard helical and soft interlock detachable micro coils  ranging in size from 2-6 mm. Post embolization arteriography confirms successful embolization of the vessel with no further visible hypervascular blush of the duodenum tumor. The micro catheter was removed. Repeat celiac artery injection was performed confirming no additional collateral supply. The 5 French catheter was readvanced of the superior mesenteric artery and an additional angiogram was performed again confirming no collateral flow to the embolized territory. The catheters were removed. The vascular sheath was left in place for hemodynamic monitoring. Ultrasound was again used to interrogate the left groin. The left common femoral vein was identified. Local anesthesia was attained by infiltration with 1% lidocaine. The left common femoral vein was punctured with an 18 gauge needle. A wire was advanced into the inferior vena cava. The skin tract was dilated and an arrow triple-lumen central venous catheter advanced over the wire and position with the tip at the confluence of the IVC and left common iliac vein. The catheter was flushed, capped and secured with 0 Prolene suture. Sterile bandages were applied. COMPLICATIONS: None IMPRESSION: 1. No evidence of active extravasation. 2. 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 3. Coil embolization of the gastroduodenal artery. 4. Placement of a left femoral triple-lumen central venous catheter. The catheter tip is at the confluence of the IVC and iliac vein and ready for immediate use. Signed, Criselda Peaches, MD [facility] Radiology Electronically Signed   By: Jacqulynn Cadet M.D.   On: 01/23/2015 15:50    Medications: I have reviewed the patient's current medications.  Assessment/Plan: 1. Upper G.I. bleed. Appears to be stable with patient still draining large quantity of dark material. Visceral angiogram did not  show active bleeding but area was embolized. 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 Still waiting for path. At this point since his bleeding seems to be stable I would keep him on Protonix trip as well as octreotide for now   Folasade Mooty JR,Shandie Bertz L 01/24/2015, 9:30 AM  Pager: [phone] If no answer or after hours call [phone]

## 2015-01-24 NOTE — Progress Notes (Signed)
Events of past few days noted.  CEA, HCG and AFP all WNL. LDH minimally up, beta-2 microglobulin WNL-- all this gives [place] no direction re nature of tumor, but pathology should be available next 24-48 h  Will follow with you

## 2015-01-24 NOTE — Procedures (Signed)
Extubation Procedure Note  Patient Details:   Name: [name] DOB: 1950-11-29 MRN: [id number]   Pt extubated to 3L [place] per MD order. Pt able to vocalize, no stridor noted. Pt tolerating well at this time. RT will continue to monitor.    Evaluation  O2 sats: stable throughout Complications: No apparent complications Patient did tolerate procedure well. Bilateral Breath Sounds: Clear Suctioning: Oral Yes  Jetty Peeks 01/24/2015, 9:45 AM

## 2015-01-24 NOTE — Progress Notes (Signed)
D/w RN from remote location  S - patient cntinues  To do well. No active bleeding. Off vent and stable  Plan - move to sdu - keep protonix gtt + octreotide gtt  + NG Tube - TRH to be primary from 01/25/15 and PCCM off - dw Dr Hartford Poli  Dr. Brand Males, M.D., [facility]C.P Pulmonary and Critical Care Medicine Staff Physician [place] [facility] Pager: [phone], If no answer or between  15:00h - 7:00h: call [phone]  01/24/2015 6:25 PM

## 2015-01-25 ENCOUNTER — Telehealth: Payer: Self-pay | Admitting: *Deleted

## 2015-01-25 ENCOUNTER — Inpatient Hospital Stay (HOSPITAL_COMMUNITY): Payer: BLUE CROSS/BLUE SHIELD

## 2015-01-25 ENCOUNTER — Telehealth: Payer: Self-pay | Admitting: Oncology

## 2015-01-25 DIAGNOSIS — R55 Syncope and collapse: Secondary | ICD-10-CM

## 2015-01-25 DIAGNOSIS — K264 Chronic or unspecified duodenal ulcer with hemorrhage: Principal | ICD-10-CM

## 2015-01-25 DIAGNOSIS — K254 Chronic or unspecified gastric ulcer with hemorrhage: Secondary | ICD-10-CM

## 2015-01-25 DIAGNOSIS — D500 Iron deficiency anemia secondary to blood loss (chronic): Secondary | ICD-10-CM

## 2015-01-25 DIAGNOSIS — C8315 Mantle cell lymphoma, lymph nodes of inguinal region and lower limb: Secondary | ICD-10-CM

## 2015-01-25 DIAGNOSIS — J9690 Respiratory failure, unspecified, unspecified whether with hypoxia or hypercapnia: Secondary | ICD-10-CM

## 2015-01-25 DIAGNOSIS — Z5111 Encounter for antineoplastic chemotherapy: Secondary | ICD-10-CM

## 2015-01-25 DIAGNOSIS — C8319 Mantle cell lymphoma, extranodal and solid organ sites: Secondary | ICD-10-CM | POA: Insufficient documentation

## 2015-01-25 DIAGNOSIS — D62 Acute posthemorrhagic anemia: Secondary | ICD-10-CM

## 2015-01-25 DIAGNOSIS — R578 Other shock: Secondary | ICD-10-CM | POA: Diagnosis present

## 2015-01-25 DIAGNOSIS — R579 Shock, unspecified: Secondary | ICD-10-CM

## 2015-01-25 DIAGNOSIS — N179 Acute kidney failure, unspecified: Secondary | ICD-10-CM

## 2015-01-25 LAB — TYPE AND SCREEN
ABO/RH(D): AB POS
ANTIBODY SCREEN: NEGATIVE
UNIT DIVISION: 0
UNIT DIVISION: 0
Unit division: 0
Unit division: 0
Unit division: 0

## 2015-01-25 LAB — CBC
HCT: 21.8 % — ABNORMAL LOW (ref 39.0–52.0)
HEMOGLOBIN: 7.1 g/dL — AB (ref 13.0–17.0)
MCH: 28.1 pg (ref 26.0–34.0)
MCHC: 32.6 g/dL (ref 30.0–36.0)
MCV: 86.2 fL (ref 78.0–100.0)
PLATELETS: 231 10*3/uL (ref 150–400)
RBC: 2.53 MIL/uL — AB (ref 4.22–5.81)
RDW: 16.8 % — ABNORMAL HIGH (ref 11.5–15.5)
WBC: 11.1 10*3/uL — ABNORMAL HIGH (ref 4.0–10.5)

## 2015-01-25 LAB — BASIC METABOLIC PANEL
Anion gap: 5 (ref 5–15)
BUN: 19 mg/dL (ref 6–20)
CHLORIDE: 113 mmol/L — AB (ref 101–111)
CO2: 23 mmol/L (ref 22–32)
CREATININE: 1.21 mg/dL (ref 0.61–1.24)
Calcium: 7.4 mg/dL — ABNORMAL LOW (ref 8.9–10.3)
GFR calc Af Amer: >60 mL/min (ref >60)
GFR calc non Af Amer: >60 mL/min (ref >60)
Glucose, Bld: 112 mg/dL — ABNORMAL HIGH (ref 65–99)
Potassium: 4.1 mmol/L (ref 3.5–5.1)
Sodium: 141 mmol/L (ref 135–145)

## 2015-01-25 LAB — CBC WITH DIFFERENTIAL/PLATELET
BASOS ABS: 0 10*3/uL (ref 0.0–0.1)
BASOS PCT: 0 %
EOS PCT: 0 %
Eosinophils Absolute: 0 10*3/uL (ref 0.0–0.7)
HCT: 20.9 % — ABNORMAL LOW (ref 39.0–52.0)
Hemoglobin: 7.1 g/dL — ABNORMAL LOW (ref 13.0–17.0)
LYMPHS PCT: 9 %
Lymphs Abs: 0.9 10*3/uL (ref 0.7–4.0)
MCH: 29.1 pg (ref 26.0–34.0)
MCHC: 34 g/dL (ref 30.0–36.0)
MCV: 85.7 fL (ref 78.0–100.0)
MONO ABS: 0.7 10*3/uL (ref 0.1–1.0)
Monocytes Relative: 6 %
Neutro Abs: 8.9 10*3/uL — ABNORMAL HIGH (ref 1.7–7.7)
Neutrophils Relative %: 85 %
Platelets: 200 10*3/uL (ref 150–400)
RBC: 2.44 MIL/uL — ABNORMAL LOW (ref 4.22–5.81)
RDW: 16.9 % — ABNORMAL HIGH (ref 11.5–15.5)
WBC: 10.5 10*3/uL (ref 4.0–10.5)

## 2015-01-25 LAB — PREPARE FRESH FROZEN PLASMA: Unit division: 0

## 2015-01-25 LAB — PROTIME-INR
INR: 1.24 (ref 0.00–1.49)
Prothrombin Time: 15.8 seconds — ABNORMAL HIGH (ref 11.6–15.2)

## 2015-01-25 LAB — PHOSPHORUS: Phosphorus: 3 mg/dL (ref 2.5–4.6)

## 2015-01-25 LAB — GLUCOSE, CAPILLARY
GLUCOSE-CAPILLARY: 110 mg/dL — AB (ref 65–99)
GLUCOSE-CAPILLARY: 109 mg/dL — AB (ref 65–99)
GLUCOSE-CAPILLARY: 104 mg/dL — AB (ref 65–99)
GLUCOSE-CAPILLARY: 87 mg/dL (ref 65–99)
Glucose-Capillary: 84 mg/dL (ref 65–99)
Glucose-Capillary: 139 mg/dL — ABNORMAL HIGH (ref 65–99)

## 2015-01-25 LAB — MAGNESIUM: Magnesium: 2.1 mg/dL (ref 1.7–2.4)

## 2015-01-25 LAB — HEMOGLOBIN AND HEMATOCRIT, BLOOD
HCT: 21.8 % — ABNORMAL LOW (ref 39.0–52.0)
HEMOGLOBIN: 7.2 g/dL — AB (ref 13.0–17.0)

## 2015-01-25 MED ORDER — SUCRALFATE 1 G PO TABS
1.0000 g | ORAL_TABLET | Freq: Three times a day (TID) | ORAL | Status: DC
  Administered 2015-01-25 – 2015-01-27 (×8): 1 g via ORAL
  Filled 2015-01-25 (×8): qty 1

## 2015-01-25 NOTE — Telephone Encounter (Signed)
VM message from pt's wife. Pt was admitted to [facility] hospital last. Dr. Jana Hakim aware and has been to see pt this am.

## 2015-01-25 NOTE — Progress Notes (Signed)
  Subjective: Sitting up, no significant soreness R groin  Objective: Vital signs in last 24 hours: Temp:  [97.8 F (36.6 C)-98.8 F (37.1 C)] 98.3 F (36.8 C) (12/22 0726) Pulse Rate:  [79-134] 79 (12/22 1021) Resp:  [0-26] 19 (12/22 1021) BP: (117-147)/(70-89) 141/85 mmHg (12/22 0726) SpO2:  [84 %-100 %] 90 % (12/22 1021) Weight:  [96 kg (211 lb 10.3 oz)-96.934 kg (213 lb 11.2 oz)] 96.934 kg (213 lb 11.2 oz) (12/22 0329) Last BM Date: 01/23/15  Intake/Output from previous day: 12/21 0701 - 12/22 0700 In: 6810.9 [I.V.:6600.9; NG/GT:60; IV Piggyback:150] Out: 2675 [Urine:2475; Emesis/NG output:200] Intake/Output this shift: Total I/O In: 775 [I.V.:775] Out: 350 [Urine:300; Emesis/NG output:50]  General appearance: alert and cooperative Extremities: incidion R groin intact with steri strips, no hematoma or seroma  Lab Results:   Recent Labs  01/25/15 0355 01/25/15 0830  WBC 10.5 11.1*  HGB 7.1* 7.1*  HCT 20.9* 21.8*  PLT 200 231   BMET  Recent Labs  01/24/15 0430 01/25/15 0355  NA 141 141  K 4.3 4.1  CL 114* 113*  CO2 21* 23  GLUCOSE 142* 112*  BUN 21* 19  CREATININE 1.65* 1.21  CALCIUM 6.7* 7.4*   Anti-infectives: Anti-infectives    Start     Dose/Rate Route Frequency Ordered Stop   01/23/15 1400  piperacillin-tazobactam (ZOSYN) IVPB 3.375 g     3.375 g 12.5 mL/hr over 240 Minutes Intravenous 3 times per day 01/23/15 0944     01/23/15 0945  piperacillin-tazobactam (ZOSYN) IVPB 3.375 g  Status:  Discontinued     3.375 g 100 mL/hr over 30 Minutes Intravenous  Once 01/23/15 0944 01/24/15 0929      Assessment/Plan: S/P R inguinal LN BX - stable, path: mantle cell lymphoma, per oncology S/P angioembolization of UGIB due to lymphoma  I spoke with his family as well  LOS: 2 days    Lanina Larranaga E 01/25/2015

## 2015-01-25 NOTE — Progress Notes (Signed)
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 Oncology on board. No gross bleeding overnight.  Objective: Vital signs in last 24 hours: Temp:  [97.8 F (36.6 C)-98.8 F (37.1 C)] 98.3 F (36.8 C) (12/22 0726) Pulse Rate:  [80-96] 81 (12/22 0329) Resp:  [0-24] 19 (12/22 0726) BP: (117-147)/(70-89) 141/85 mmHg (12/22 0726) SpO2:  [94 %-100 %] 100 % (12/22 0726) Weight:  [96 kg (211 lb 10.3 oz)-96.934 kg (213 lb 11.2 oz)] 96.934 kg (213 lb 11.2 oz) (12/22 0329) Last BM Date: 01/23/15  Intake/Output from previous day: 12/21 0701 - 12/22 0700 In: 6810.9 [I.V.:6600.9; NG/GT:60; IV Piggyback:150] Out: 2475 [Urine:2475] Intake/Output this shift: Total I/O In: 368.8 [I.V.:368.8] Out: -   PE: General-- no acute distress NG tube training clear liquid  Lungs-- clear Abdomen-- soft and nontender  Lab Results:  Recent Labs  01/23/15 1710 01/23/15 2230 01/24/15 0430 01/25/15 0355 01/25/15 0830  WBC 19.2* 17.0* 14.5* 10.5 11.1*  HGB 8.6* 8.0* 7.5* 7.1* 7.1*  HCT 25.5* 23.7* 22.1* 20.9* 21.8*  PLT 224 216 207 200 231   BMET  Recent Labs  01/23/15 0751 01/23/15 0812 01/24/15 0430 01/25/15 0355  NA 147* 142 141 141  K 5.7* 5.5* 4.3 4.1  CL 111 113* 114* 113*  CO2 8*  --  21* 23  CREATININE 1.71* 1.50* 1.65* 1.21   LFT  Recent Labs  01/23/15 0751 01/24/15 0430  PROT 4.5* 4.1*  AST 44* 39  ALT 37 37  ALKPHOS 67 61  BILITOT 0.2* 0.5   PT/INR  Recent Labs  01/23/15 0751 01/25/15 0355  LABPROT 18.4* 15.8*  INR 1.53* 1.24   PANCREAS No results for input(s): LIPASE in the last 72 hours.       Studies/Results: Ct Head Wo Contrast  01/23/2015  CLINICAL DATA:  64 year old male with fall. EXAM: CT HEAD WITHOUT CONTRAST CT CERVICAL SPINE WITHOUT CONTRAST TECHNIQUE: Multidetector CT imaging of the head and cervical spine was performed following the standard protocol without intravenous contrast. Multiplanar CT  image reconstructions of the cervical spine were also generated. COMPARISON:  None. FINDINGS: CT HEAD FINDINGS The ventricles and sulci are appropriate in size for patient's age. Mild periventricular and deep white matter hypodensities represent chronic microvascular ischemic changes. There is no intracranial hemorrhage. No mass effect or midline shift identified. An endotracheal and enteric tube are partially visualized. There is diffuse mucoperiosteal thickening of the paranasal sinuses with partial opacification of the ethmoid air cells. The mastoid air cells are well aerated. Right occipital scalp hematoma. The calvarium is intact. CT CERVICAL SPINE FINDINGS There is no acute fracture or subluxation of the cervical spine.Mild multilevel degenerative changes.The odontoid and spinous processes are intact.There is normal anatomic alignment of the C1-C2 lateral masses. The visualized soft tissues appear unremarkable. An enteric tube and an endotracheal tube are partially visualized. Partially visualized bilateral pleural effusions, right greater than left. There is bilateral supraclavicular and right cervical adenopathy. Upper mediastinal lymphadenopathy noted. Chest CT is recommended for further evaluation. Right supraclavicular soft tissue hemorrhage noted. IMPRESSION: No acute intracranial hemorrhage. 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 No acute/traumatic cervical spine pathology. Bilateral supraclavicular and right cervical as well as mediastinal adenopathy. Further evaluation recommended. Electronically Signed   By: Anner Crete M.D.   On: 01/23/2015 18:27   Ct Cervical Spine Wo Contrast  01/23/2015  CLINICAL DATA:  64 year old male with fall. EXAM: CT HEAD WITHOUT CONTRAST CT CERVICAL SPINE WITHOUT CONTRAST TECHNIQUE: Multidetector CT imaging  of the head and cervical spine was performed following the standard protocol without intravenous contrast. Multiplanar CT image  reconstructions of the cervical spine were also generated. COMPARISON:  None. FINDINGS: CT HEAD FINDINGS The ventricles and sulci are appropriate in size for patient's age. Mild periventricular and deep white matter hypodensities represent chronic microvascular ischemic changes. There is no intracranial hemorrhage. No mass effect or midline shift identified. An endotracheal and enteric tube are partially visualized. There is diffuse mucoperiosteal thickening of the paranasal sinuses with partial opacification of the ethmoid air cells. The mastoid air cells are well aerated. Right occipital scalp hematoma. The calvarium is intact. CT CERVICAL SPINE FINDINGS There is no acute fracture or subluxation of the cervical spine.Mild multilevel degenerative changes.The odontoid and spinous processes are intact.There is normal anatomic alignment of the C1-C2 lateral masses. The visualized soft tissues appear unremarkable. An enteric tube and an endotracheal tube are partially visualized. Partially visualized bilateral pleural effusions, right greater than left. There is bilateral supraclavicular and right cervical adenopathy. Upper mediastinal lymphadenopathy noted. Chest CT is recommended for further evaluation. Right supraclavicular soft tissue hemorrhage noted. IMPRESSION: No acute intracranial hemorrhage. 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 No acute/traumatic cervical spine pathology. Bilateral supraclavicular and right cervical as well as mediastinal adenopathy. Further evaluation recommended. Electronically Signed   By: Anner Crete M.D.   On: 01/23/2015 18:27   Ir Angiogram Visceral Selective  01/23/2015  CLINICAL DATA:  64 year old male presents with acute life-threatening upper GI bleed secondary to an ulcerated mass encompassing the distal stomach and duodenum. The patient's underlying etiology is favored to represent lymphoma which should is currently undergoing confirmatory  diagnosis. Endoscopic treatment options are limited and surgery is extremely high risk. Patient presents for emergent visceral arteriography and embolization. Additionally, central venous access is required. A triple-lumen central venous catheter will be placed during the course of the procedure. EXAM: SELECTIVE VISCERAL ARTERIOGRAPHY; IR EMBO ART VEN HEMORR LYMPH EXTRAV INC GUIDE ROADMAPPING; IR LEFT FLOURO GUIDE CV LINE; ADDITIONAL ARTERIOGRAPHY; IR ULTRASOUND GUIDANCE VASC ACCESS LEFT; ARTERIOGRAPHY Date: 01/23/2015 PROCEDURE: 1. Ultrasound-guided puncture left common femoral artery 2. Catheterization of the superior mesenteric artery with arteriogram 3. Catheterization of the celiac artery with arteriogram 4. Catheterization of the gastroduodenal artery with arteriogram 5. Catheterization of the right gastroepiploic artery with arteriogram 6. Coil embolization of the gastroduodenal artery 7. Post embolization arteriogram 8. Limited left common femoral arteriogram 9. Ultrasound-guided puncture of the left common femoral vein 10. Placement of a triple-lumen central venous catheter via the left femoral vein with fluoroscopic guidance Interventional Radiologist:  Criselda Peaches, MD ANESTHESIA/SEDATION: Moderate (conscious) sedation was used. 1 mg Versed, 50 mcg Fentanyl were administered intravenously. The patient's vital signs were monitored continuously by radiology nursing throughout the procedure. Sedation Time: 60 minutes MEDICATIONS: None additional FLUOROSCOPY TIME:  16 minutes for a total of 622 mGy CONTRAST:  171mL OMNIPAQUE IOHEXOL 300 MG/ML  SOLN TECHNIQUE: Informed consent was obtained from the patient following explanation of the procedure, risks, benefits and alternatives. The patient understands, agrees and consents for the procedure. All questions were addressed. A time out was performed. Maximal barrier sterile technique utilized including caps, mask, sterile gowns, sterile gloves, large  sterile drape, hand hygiene, and Betadine skin prep. The left groin was interrogated with ultrasound. The common femoral artery is found to be widely patent. An image was obtained and stored for the medical record. Local anesthesia was attained by infiltration with 1% lidocaine. Under real-time sonographic guidance, the  vessel was punctured with a 21 gauge micropuncture needle. Using standard technique, the initial micro wire was exchanged through a transitional 5 [place] micro sheath for a working 0.035 inch Bentson wire. The micro sheath was then exchanged for a working 5 [place] vascular sheath. A C2 cobra catheter was advanced into the abdominal aorta over the wire. The catheter was initially advanced into the superior mesenteric artery and superior mesenteric arteriogram was performed. There is conventional anatomy. No replaced or accessory right hepatic artery. No evidence of active extravasation or significant collateralization through the pancreaticoduodenal arcade. The C2 cobra catheter was next advanced into the celiac artery. An arteriogram was performed. The gastroduodenal artery is elongated and slightly irregular in the mid segment likely secondary to external mass effect. There is no evidence of active extravasation. A renegade ST micro catheter was advanced coaxially over a fat and 16 wire. The micro catheter was advanced into the gastroduodenal artery. Arteriography was again performed. There are multiple branches arising from the gastroduodenal artery providing supply to a hypervascular mass in the region the descending duodenum. The micro catheter was carefully advanced more distally into the proximal right gastroepiploic artery. Contrast injection was performed confirming the location of the catheter tip as well as confirming no additional supplied to the region of tumor. Coil embolization was then performed of the gastroduodenal artery using a combination of vortex, standard helical and soft  interlock detachable micro coils ranging in size from 2-6 mm. Post embolization arteriography confirms successful embolization of the vessel with no further visible hypervascular blush of the duodenum tumor. The micro catheter was removed. Repeat celiac artery injection was performed confirming no additional collateral supply. The 5 French catheter was readvanced of the superior mesenteric artery and an additional angiogram was performed again confirming no collateral flow to the embolized territory. The catheters were removed. The vascular sheath was left in place for hemodynamic monitoring. Ultrasound was again used to interrogate the left groin. The left common femoral vein was identified. Local anesthesia was attained by infiltration with 1% lidocaine. The left common femoral vein was punctured with an 18 gauge needle. A wire was advanced into the inferior vena cava. The skin tract was dilated and an arrow triple-lumen central venous catheter advanced over the wire and position with the tip at the confluence of the IVC and left common iliac vein. The catheter was flushed, capped and secured with 0 Prolene suture. Sterile bandages were applied. COMPLICATIONS: None IMPRESSION: 1. No evidence of active extravasation. 2. 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 3. Coil embolization of the gastroduodenal artery. 4. Placement of a left femoral triple-lumen central venous catheter. The catheter tip is at the confluence of the IVC and iliac vein and ready for immediate use. Signed, Criselda Peaches, MD [facility] Electronically Signed   By: Jacqulynn Cadet M.D.   On: 01/23/2015 15:50   Ir Angiogram Visceral Selective  01/23/2015  CLINICAL DATA:  64 year old male presents with acute life-threatening upper GI bleed secondary to an ulcerated mass encompassing the  distal stomach and duodenum. 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 Endoscopic treatment options are limited and surgery is extremely high risk. Patient presents for emergent visceral arteriography and embolization. Additionally, central venous access is required. A triple-lumen central venous catheter will be placed during the course of the procedure. EXAM: SELECTIVE VISCERAL ARTERIOGRAPHY;  IR EMBO ART VEN HEMORR LYMPH EXTRAV INC GUIDE ROADMAPPING; IR LEFT FLOURO GUIDE CV LINE; ADDITIONAL ARTERIOGRAPHY; IR ULTRASOUND GUIDANCE VASC ACCESS LEFT; ARTERIOGRAPHY Date: 01/23/2015 PROCEDURE: 1. Ultrasound-guided puncture left common femoral artery 2. Catheterization of the superior mesenteric artery with arteriogram 3. Catheterization of the celiac artery with arteriogram 4. Catheterization of the gastroduodenal artery with arteriogram 5. Catheterization of the right gastroepiploic artery with arteriogram 6. Coil embolization of the gastroduodenal artery 7. Post embolization arteriogram 8. Limited left common femoral arteriogram 9. Ultrasound-guided puncture of the left common femoral vein 10. Placement of a triple-lumen central venous catheter via the left femoral vein with fluoroscopic guidance Interventional Radiologist:  Criselda Peaches, MD ANESTHESIA/SEDATION: Moderate (conscious) sedation was used. 1 mg Versed, 50 mcg Fentanyl were administered intravenously. The patient's vital signs were monitored continuously by radiology nursing throughout the procedure. Sedation Time: 60 minutes MEDICATIONS: None additional FLUOROSCOPY TIME:  16 minutes for a total of 622 mGy CONTRAST:  126mL OMNIPAQUE IOHEXOL 300 MG/ML  SOLN TECHNIQUE: Informed consent was obtained from the patient following explanation of the procedure, risks, benefits and alternatives. The patient understands, agrees and consents for the procedure. All questions were  addressed. A time out was performed. Maximal barrier sterile technique utilized including caps, mask, sterile gowns, sterile gloves, large sterile drape, hand hygiene, and Betadine skin prep. The left groin was interrogated with ultrasound. The common femoral artery is found to be widely patent. An image was obtained and stored for the medical record. Local anesthesia was attained by infiltration with 1% lidocaine. Under real-time sonographic guidance, the vessel was punctured with a 21 gauge micropuncture needle. Using standard technique, the initial micro wire was exchanged through a transitional 5 [place] micro sheath for a working 0.035 inch Bentson wire. The micro sheath was then exchanged for a working 5 [place] vascular sheath. A C2 cobra catheter was advanced into the abdominal aorta over the wire. The catheter was initially advanced into the superior mesenteric artery and superior mesenteric arteriogram was performed. There is conventional anatomy. No replaced or accessory right hepatic artery. No evidence of active extravasation or significant collateralization through the pancreaticoduodenal arcade. The C2 cobra catheter was next advanced into the celiac artery. An arteriogram was performed. The gastroduodenal artery is elongated and slightly irregular in the mid segment likely secondary to external mass effect. There is no evidence of active extravasation. A renegade ST micro catheter was advanced coaxially over a fat and 16 wire. The micro catheter was advanced into the gastroduodenal artery. Arteriography was again performed. There are multiple branches arising from the gastroduodenal artery providing supply to a hypervascular mass in the region the descending duodenum. The micro catheter was carefully advanced more distally into the proximal right gastroepiploic artery. Contrast injection was performed confirming the location of the catheter tip as well as confirming no additional supplied to the region  of tumor. Coil embolization was then performed of the gastroduodenal artery using a combination of vortex, standard helical and soft interlock detachable micro coils ranging in size from 2-6 mm. Post embolization arteriography confirms successful embolization of the vessel with no further visible hypervascular blush of the duodenum tumor. The micro catheter was removed. Repeat celiac artery injection was performed confirming no additional collateral supply. The 5 French catheter was readvanced of the superior mesenteric artery and an additional angiogram was performed again confirming no collateral flow to the embolized territory. The catheters were removed. The vascular sheath was left in place for hemodynamic monitoring. Ultrasound was again  used to interrogate the left groin. The left common femoral vein was identified. Local anesthesia was attained by infiltration with 1% lidocaine. The left common femoral vein was punctured with an 18 gauge needle. A wire was advanced into the inferior vena cava. The skin tract was dilated and an arrow triple-lumen central venous catheter advanced over the wire and position with the tip at the confluence of the IVC and left common iliac vein. The catheter was flushed, capped and secured with 0 Prolene suture. Sterile bandages were applied. COMPLICATIONS: None IMPRESSION: 1. No evidence of active extravasation. 2. 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 3. Coil embolization of the gastroduodenal artery. 4. Placement of a left femoral triple-lumen central venous catheter. The catheter tip is at the confluence of the IVC and iliac vein and ready for immediate use. Signed, Criselda Peaches, MD [facility] Electronically Signed   By: Jacqulynn Cadet M.D.   On: 01/23/2015 15:50   Ir Angiogram Selective Each Additional  Vessel  01/23/2015  CLINICAL DATA:  64 year old male presents with acute life-threatening upper GI bleed secondary to an ulcerated mass encompassing the distal stomach and duodenum. 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 Endoscopic treatment options are limited and surgery is extremely high risk. Patient presents for emergent visceral arteriography and embolization. Additionally, central venous access is required. A triple-lumen central venous catheter will be placed during the course of the procedure. EXAM: SELECTIVE VISCERAL ARTERIOGRAPHY; IR EMBO ART VEN HEMORR LYMPH EXTRAV INC GUIDE ROADMAPPING; IR LEFT FLOURO GUIDE CV LINE; ADDITIONAL ARTERIOGRAPHY; IR ULTRASOUND GUIDANCE VASC ACCESS LEFT; ARTERIOGRAPHY Date: 01/23/2015 PROCEDURE: 1. Ultrasound-guided puncture left common femoral artery 2. Catheterization of the superior mesenteric artery with arteriogram 3. Catheterization of the celiac artery with arteriogram 4. Catheterization of the gastroduodenal artery with arteriogram 5. Catheterization of the right gastroepiploic artery with arteriogram 6. Coil embolization of the gastroduodenal artery 7. Post embolization arteriogram 8. Limited left common femoral arteriogram 9. Ultrasound-guided puncture of the left common femoral vein 10. Placement of a triple-lumen central venous catheter via the left femoral vein with fluoroscopic guidance Interventional Radiologist:  Criselda Peaches, MD ANESTHESIA/SEDATION: Moderate (conscious) sedation was used. 1 mg Versed, 50 mcg Fentanyl were administered intravenously. The patient's vital signs were monitored continuously by radiology nursing throughout the procedure. Sedation Time: 60 minutes MEDICATIONS: None additional FLUOROSCOPY TIME:  16 minutes for a total of 622 mGy CONTRAST:  120mL OMNIPAQUE IOHEXOL 300 MG/ML  SOLN TECHNIQUE: Informed consent was obtained from the patient  following explanation of the procedure, risks, benefits and alternatives. The patient understands, agrees and consents for the procedure. All questions were addressed. A time out was performed. Maximal barrier sterile technique utilized including caps, mask, sterile gowns, sterile gloves, large sterile drape, hand hygiene, and Betadine skin prep. The left groin was interrogated with ultrasound. The common femoral artery is found to be widely patent. An image was obtained and stored for the medical record. Local anesthesia was attained by infiltration with 1% lidocaine. Under real-time sonographic guidance, the vessel was punctured with a 21 gauge micropuncture needle. Using standard technique, the initial micro wire was exchanged through a transitional 5 [place] micro sheath for a working 0.035 inch Bentson wire. The micro sheath was then exchanged for a working 5 [place] vascular sheath. A C2 cobra catheter was advanced into the abdominal aorta over  the wire. The catheter was initially advanced into the superior mesenteric artery and superior mesenteric arteriogram was performed. There is conventional anatomy. No replaced or accessory right hepatic artery. No evidence of active extravasation or significant collateralization through the pancreaticoduodenal arcade. The C2 cobra catheter was next advanced into the celiac artery. An arteriogram was performed. The gastroduodenal artery is elongated and slightly irregular in the mid segment likely secondary to external mass effect. There is no evidence of active extravasation. A renegade ST micro catheter was advanced coaxially over a fat and 16 wire. The micro catheter was advanced into the gastroduodenal artery. Arteriography was again performed. There are multiple branches arising from the gastroduodenal artery providing supply to a hypervascular mass in the region the descending duodenum. The micro catheter was carefully advanced more distally into the proximal right  gastroepiploic artery. Contrast injection was performed confirming the location of the catheter tip as well as confirming no additional supplied to the region of tumor. Coil embolization was then performed of the gastroduodenal artery using a combination of vortex, standard helical and soft interlock detachable micro coils ranging in size from 2-6 mm. Post embolization arteriography confirms successful embolization of the vessel with no further visible hypervascular blush of the duodenum tumor. The micro catheter was removed. Repeat celiac artery injection was performed confirming no additional collateral supply. The 5 French catheter was readvanced of the superior mesenteric artery and an additional angiogram was performed again confirming no collateral flow to the embolized territory. The catheters were removed. The vascular sheath was left in place for hemodynamic monitoring. Ultrasound was again used to interrogate the left groin. The left common femoral vein was identified. Local anesthesia was attained by infiltration with 1% lidocaine. The left common femoral vein was punctured with an 18 gauge needle. A wire was advanced into the inferior vena cava. The skin tract was dilated and an arrow triple-lumen central venous catheter advanced over the wire and position with the tip at the confluence of the IVC and left common iliac vein. The catheter was flushed, capped and secured with 0 Prolene suture. Sterile bandages were applied. COMPLICATIONS: None IMPRESSION: 1. No evidence of active extravasation. 2. 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 3. Coil embolization of the gastroduodenal artery. 4. Placement of a left femoral triple-lumen central venous catheter. The catheter tip is at the confluence of the IVC and iliac vein and ready for immediate use. Signed, Criselda Peaches, MD [facility] Radiology Electronically Signed   By: Jacqulynn Cadet M.D.   On: 01/23/2015 15:50   Ir Angiogram Follow Up Study  01/23/2015  CLINICAL DATA:  64 year old male presents with acute life-threatening upper GI bleed secondary to an ulcerated mass encompassing the distal stomach and duodenum. 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 Endoscopic treatment options are limited and surgery is extremely high risk. Patient presents for emergent visceral arteriography and embolization. Additionally, central venous access is required. A triple-lumen central venous catheter will be placed during the course of the procedure. EXAM: SELECTIVE VISCERAL ARTERIOGRAPHY; IR EMBO ART VEN HEMORR LYMPH EXTRAV INC GUIDE ROADMAPPING; IR LEFT FLOURO GUIDE CV LINE; ADDITIONAL ARTERIOGRAPHY; IR ULTRASOUND GUIDANCE VASC ACCESS LEFT; ARTERIOGRAPHY Date: 01/23/2015 PROCEDURE: 1. Ultrasound-guided puncture left common femoral artery 2. Catheterization of the superior mesenteric artery with arteriogram 3. Catheterization of the celiac artery with arteriogram 4. Catheterization of  the gastroduodenal artery with arteriogram 5. Catheterization of the right gastroepiploic artery with arteriogram 6. Coil embolization of the gastroduodenal artery 7. Post embolization arteriogram 8. Limited left common femoral arteriogram 9. Ultrasound-guided puncture of the left common femoral vein 10. Placement of a triple-lumen central venous catheter via the left femoral vein with fluoroscopic guidance Interventional Radiologist:  Criselda Peaches, MD ANESTHESIA/SEDATION: Moderate (conscious) sedation was used. 1 mg Versed, 50 mcg Fentanyl were administered intravenously. The patient's vital signs were monitored continuously by radiology nursing throughout the procedure. Sedation Time: 60 minutes MEDICATIONS: None additional FLUOROSCOPY TIME:  16 minutes  for a total of 622 mGy CONTRAST:  118mL OMNIPAQUE IOHEXOL 300 MG/ML  SOLN TECHNIQUE: Informed consent was obtained from the patient following explanation of the procedure, risks, benefits and alternatives. The patient understands, agrees and consents for the procedure. All questions were addressed. A time out was performed. Maximal barrier sterile technique utilized including caps, mask, sterile gowns, sterile gloves, large sterile drape, hand hygiene, and Betadine skin prep. The left groin was interrogated with ultrasound. The common femoral artery is found to be widely patent. An image was obtained and stored for the medical record. Local anesthesia was attained by infiltration with 1% lidocaine. Under real-time sonographic guidance, the vessel was punctured with a 21 gauge micropuncture needle. Using standard technique, the initial micro wire was exchanged through a transitional 5 [place] micro sheath for a working 0.035 inch Bentson wire. The micro sheath was then exchanged for a working 5 [place] vascular sheath. A C2 cobra catheter was advanced into the abdominal aorta over the wire. The catheter was initially advanced into the superior mesenteric artery and superior mesenteric arteriogram was performed. There is conventional anatomy. No replaced or accessory right hepatic artery. No evidence of active extravasation or significant collateralization through the pancreaticoduodenal arcade. The C2 cobra catheter was next advanced into the celiac artery. An arteriogram was performed. The gastroduodenal artery is elongated and slightly irregular in the mid segment likely secondary to external mass effect. There is no evidence of active extravasation. A renegade ST micro catheter was advanced coaxially over a fat and 16 wire. The micro catheter was advanced into the gastroduodenal artery. Arteriography was again performed. There are multiple branches arising from the gastroduodenal artery providing supply to a  hypervascular mass in the region the descending duodenum. The micro catheter was carefully advanced more distally into the proximal right gastroepiploic artery. Contrast injection was performed confirming the location of the catheter tip as well as confirming no additional supplied to the region of tumor. Coil embolization was then performed of the gastroduodenal artery using a combination of vortex, standard helical and soft interlock detachable micro coils ranging in size from 2-6 mm. Post embolization arteriography confirms successful embolization of the vessel with no further visible hypervascular blush of the duodenum tumor. The micro catheter was removed. Repeat celiac artery injection was performed confirming no additional collateral supply. The 5 French catheter was readvanced of the superior mesenteric artery and an additional angiogram was performed again confirming no collateral flow to the embolized territory. The catheters were removed. The vascular sheath was left in place for hemodynamic monitoring. Ultrasound was again used to interrogate the left groin. The left common femoral vein was identified. Local anesthesia was attained by infiltration with 1% lidocaine. The left common femoral vein was punctured with an 18 gauge needle. A wire was advanced into the inferior vena cava. The skin tract was dilated and an arrow triple-lumen central venous catheter  advanced over the wire and position with the tip at the confluence of the IVC and left common iliac vein. The catheter was flushed, capped and secured with 0 Prolene suture. Sterile bandages were applied. COMPLICATIONS: None IMPRESSION: 1. No evidence of active extravasation. 2. 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 3. Coil embolization of the gastroduodenal artery. 4. Placement of a left femoral triple-lumen central venous catheter. The catheter tip is at the  confluence of the IVC and iliac vein and ready for immediate use. Signed, Criselda Peaches, MD [facility] Radiology Electronically Signed   By: Jacqulynn Cadet M.D.   On: 01/23/2015 15:50   Ir Fluoro Guide Cv Line Left  01/23/2015  CLINICAL DATA:  64 year old male presents with acute life-threatening upper GI bleed secondary to an ulcerated mass encompassing the distal stomach and duodenum. 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 Endoscopic treatment options are limited and surgery is extremely high risk. Patient presents for emergent visceral arteriography and embolization. Additionally, central venous access is required. A triple-lumen central venous catheter will be placed during the course of the procedure. EXAM: SELECTIVE VISCERAL ARTERIOGRAPHY; IR EMBO ART VEN HEMORR LYMPH EXTRAV INC GUIDE ROADMAPPING; IR LEFT FLOURO GUIDE CV LINE; ADDITIONAL ARTERIOGRAPHY; IR ULTRASOUND GUIDANCE VASC ACCESS LEFT; ARTERIOGRAPHY Date: 01/23/2015 PROCEDURE: 1. Ultrasound-guided puncture left common femoral artery 2. Catheterization of the superior mesenteric artery with arteriogram 3. Catheterization of the celiac artery with arteriogram 4. Catheterization of the gastroduodenal artery with arteriogram 5. Catheterization of the right gastroepiploic artery with arteriogram 6. Coil embolization of the gastroduodenal artery 7. Post embolization arteriogram 8. Limited left common femoral arteriogram 9. Ultrasound-guided puncture of the left common femoral vein 10. Placement of a triple-lumen central venous catheter via the left femoral vein with fluoroscopic guidance Interventional Radiologist:  Criselda Peaches, MD ANESTHESIA/SEDATION: Moderate (conscious) sedation was used. 1 mg Versed, 50 mcg Fentanyl were administered intravenously. The patient's vital signs were monitored  continuously by radiology nursing throughout the procedure. Sedation Time: 60 minutes MEDICATIONS: None additional FLUOROSCOPY TIME:  16 minutes for a total of 622 mGy CONTRAST:  139mL OMNIPAQUE IOHEXOL 300 MG/ML  SOLN TECHNIQUE: Informed consent was obtained from the patient following explanation of the procedure, risks, benefits and alternatives. The patient understands, agrees and consents for the procedure. All questions were addressed. A time out was performed. Maximal barrier sterile technique utilized including caps, mask, sterile gowns, sterile gloves, large sterile drape, hand hygiene, and Betadine skin prep. The left groin was interrogated with ultrasound. The common femoral artery is found to be widely patent. An image was obtained and stored for the medical record. Local anesthesia was attained by infiltration with 1% lidocaine. Under real-time sonographic guidance, the vessel was punctured with a 21 gauge micropuncture needle. Using standard technique, the initial micro wire was exchanged through a transitional 5 [place] micro sheath for a working 0.035 inch Bentson wire. The micro sheath was then exchanged for a working 5 [place] vascular sheath. A C2 cobra catheter was advanced into the abdominal aorta over the wire. The catheter was initially advanced into the superior mesenteric artery and superior mesenteric arteriogram was performed. There is conventional anatomy. No replaced or accessory right hepatic artery. No evidence of active extravasation or significant collateralization through the pancreaticoduodenal arcade. The C2 cobra catheter was next advanced into the celiac artery. An arteriogram was  performed. The gastroduodenal artery is elongated and slightly irregular in the mid segment likely secondary to external mass effect. There is no evidence of active extravasation. A renegade ST micro catheter was advanced coaxially over a fat and 16 wire. The micro catheter was advanced into the  gastroduodenal artery. Arteriography was again performed. There are multiple branches arising from the gastroduodenal artery providing supply to a hypervascular mass in the region the descending duodenum. The micro catheter was carefully advanced more distally into the proximal right gastroepiploic artery. Contrast injection was performed confirming the location of the catheter tip as well as confirming no additional supplied to the region of tumor. Coil embolization was then performed of the gastroduodenal artery using a combination of vortex, standard helical and soft interlock detachable micro coils ranging in size from 2-6 mm. Post embolization arteriography confirms successful embolization of the vessel with no further visible hypervascular blush of the duodenum tumor. The micro catheter was removed. Repeat celiac artery injection was performed confirming no additional collateral supply. The 5 French catheter was readvanced of the superior mesenteric artery and an additional angiogram was performed again confirming no collateral flow to the embolized territory. The catheters were removed. The vascular sheath was left in place for hemodynamic monitoring. Ultrasound was again used to interrogate the left groin. The left common femoral vein was identified. Local anesthesia was attained by infiltration with 1% lidocaine. The left common femoral vein was punctured with an 18 gauge needle. A wire was advanced into the inferior vena cava. The skin tract was dilated and an arrow triple-lumen central venous catheter advanced over the wire and position with the tip at the confluence of the IVC and left common iliac vein. The catheter was flushed, capped and secured with 0 Prolene suture. Sterile bandages were applied. COMPLICATIONS: None IMPRESSION: 1. No evidence of active extravasation. 2. 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 3.  Coil embolization of the gastroduodenal artery. 4. Placement of a left femoral triple-lumen central venous catheter. The catheter tip is at the confluence of the IVC and iliac vein and ready for immediate use. Signed, Criselda Peaches, MD [facility] Radiology Electronically Signed   By: Jacqulynn Cadet M.D.   On: 01/23/2015 15:50   Ir US Guide Vasc Access Left  01/23/2015  CLINICAL DATA:  64 year old male presents with acute life-threatening upper GI bleed secondary to an ulcerated mass encompassing the distal stomach and duodenum. 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 Endoscopic treatment options are limited and surgery is extremely high risk. Patient presents for emergent visceral arteriography and embolization. Additionally, central venous access is required. A triple-lumen central venous catheter will be placed during the course of the procedure. EXAM: SELECTIVE VISCERAL ARTERIOGRAPHY; IR EMBO ART VEN HEMORR LYMPH EXTRAV INC GUIDE ROADMAPPING; IR LEFT FLOURO GUIDE CV LINE; ADDITIONAL ARTERIOGRAPHY; IR ULTRASOUND GUIDANCE VASC ACCESS LEFT; ARTERIOGRAPHY Date: 01/23/2015 PROCEDURE: 1. Ultrasound-guided puncture left common femoral artery 2. Catheterization of the superior mesenteric artery with arteriogram 3. Catheterization of the celiac artery with arteriogram 4. Catheterization of the gastroduodenal artery with arteriogram 5. Catheterization of the right gastroepiploic artery with arteriogram 6. Coil embolization of the gastroduodenal artery 7. Post embolization arteriogram 8. Limited left common femoral arteriogram 9. Ultrasound-guided puncture of the left common femoral vein 10. Placement of a triple-lumen central venous catheter via the left femoral vein with  fluoroscopic guidance Interventional Radiologist:  Criselda Peaches, MD ANESTHESIA/SEDATION: Moderate  (conscious) sedation was used. 1 mg Versed, 50 mcg Fentanyl were administered intravenously. The patient's vital signs were monitored continuously by radiology nursing throughout the procedure. Sedation Time: 60 minutes MEDICATIONS: None additional FLUOROSCOPY TIME:  16 minutes for a total of 622 mGy CONTRAST:  193mL OMNIPAQUE IOHEXOL 300 MG/ML  SOLN TECHNIQUE: Informed consent was obtained from the patient following explanation of the procedure, risks, benefits and alternatives. The patient understands, agrees and consents for the procedure. All questions were addressed. A time out was performed. Maximal barrier sterile technique utilized including caps, mask, sterile gowns, sterile gloves, large sterile drape, hand hygiene, and Betadine skin prep. The left groin was interrogated with ultrasound. The common femoral artery is found to be widely patent. An image was obtained and stored for the medical record. Local anesthesia was attained by infiltration with 1% lidocaine. Under real-time sonographic guidance, the vessel was punctured with a 21 gauge micropuncture needle. Using standard technique, the initial micro wire was exchanged through a transitional 5 [place] micro sheath for a working 0.035 inch Bentson wire. The micro sheath was then exchanged for a working 5 [place] vascular sheath. A C2 cobra catheter was advanced into the abdominal aorta over the wire. The catheter was initially advanced into the superior mesenteric artery and superior mesenteric arteriogram was performed. There is conventional anatomy. No replaced or accessory right hepatic artery. No evidence of active extravasation or significant collateralization through the pancreaticoduodenal arcade. The C2 cobra catheter was next advanced into the celiac artery. An arteriogram was performed. The gastroduodenal artery is elongated and slightly irregular in the mid segment likely secondary to external mass effect. There is no evidence of active  extravasation. A renegade ST micro catheter was advanced coaxially over a fat and 16 wire. The micro catheter was advanced into the gastroduodenal artery. Arteriography was again performed. There are multiple branches arising from the gastroduodenal artery providing supply to a hypervascular mass in the region the descending duodenum. The micro catheter was carefully advanced more distally into the proximal right gastroepiploic artery. Contrast injection was performed confirming the location of the catheter tip as well as confirming no additional supplied to the region of tumor. Coil embolization was then performed of the gastroduodenal artery using a combination of vortex, standard helical and soft interlock detachable micro coils ranging in size from 2-6 mm. Post embolization arteriography confirms successful embolization of the vessel with no further visible hypervascular blush of the duodenum tumor. The micro catheter was removed. Repeat celiac artery injection was performed confirming no additional collateral supply. The 5 French catheter was readvanced of the superior mesenteric artery and an additional angiogram was performed again confirming no collateral flow to the embolized territory. The catheters were removed. The vascular sheath was left in place for hemodynamic monitoring. Ultrasound was again used to interrogate the left groin. The left common femoral vein was identified. Local anesthesia was attained by infiltration with 1% lidocaine. The left common femoral vein was punctured with an 18 gauge needle. A wire was advanced into the inferior vena cava. The skin tract was dilated and an arrow triple-lumen central venous catheter advanced over the wire and position with the tip at the confluence of the IVC and left common iliac vein. The catheter was flushed, capped and secured with 0 Prolene suture. Sterile bandages were applied. COMPLICATIONS: None IMPRESSION: 1. No evidence of active extravasation. 2.  Positive hypervascular tumor blush in the region of  the descending duodenum with arterial supply from the gastroduodenal artery. 3. Coil embolization of the gastroduodenal artery. 4. Placement of a left femoral triple-lumen central venous catheter. The catheter tip is at the confluence of the IVC and iliac vein and ready for immediate use. Signed, Criselda Peaches, MD [facility] Radiology Electronically Signed   By: Jacqulynn Cadet M.D.   On: 01/23/2015 15:50   Ir US Guide Vasc Access Left  01/23/2015  CLINICAL DATA:  64 year old male presents with acute life-threatening upper GI bleed secondary to an ulcerated mass encompassing the distal stomach and duodenum. 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 Endoscopic treatment options are limited and surgery is extremely high risk. Patient presents for emergent visceral arteriography and embolization. Additionally, central venous access is required. A triple-lumen central venous catheter will be placed during the course of the procedure. EXAM: SELECTIVE VISCERAL ARTERIOGRAPHY; IR EMBO ART VEN HEMORR LYMPH EXTRAV INC GUIDE ROADMAPPING; IR LEFT FLOURO GUIDE CV LINE; ADDITIONAL ARTERIOGRAPHY; IR ULTRASOUND GUIDANCE VASC ACCESS LEFT; ARTERIOGRAPHY Date: 01/23/2015 PROCEDURE: 1. Ultrasound-guided puncture left common femoral artery 2. Catheterization of the superior mesenteric artery with arteriogram 3. Catheterization of the celiac artery with arteriogram 4. Catheterization of the gastroduodenal artery with arteriogram 5. Catheterization of the right gastroepiploic artery with arteriogram 6. Coil embolization of the gastroduodenal artery 7. Post embolization arteriogram 8. Limited left common femoral arteriogram 9. Ultrasound-guided puncture of the left common femoral vein 10. Placement of a triple-lumen central venous catheter via  the left femoral vein with fluoroscopic guidance Interventional Radiologist:  Criselda Peaches, MD ANESTHESIA/SEDATION: Moderate (conscious) sedation was used. 1 mg Versed, 50 mcg Fentanyl were administered intravenously. The patient's vital signs were monitored continuously by radiology nursing throughout the procedure. Sedation Time: 60 minutes MEDICATIONS: None additional FLUOROSCOPY TIME:  16 minutes for a total of 622 mGy CONTRAST:  154mL OMNIPAQUE IOHEXOL 300 MG/ML  SOLN TECHNIQUE: Informed consent was obtained from the patient following explanation of the procedure, risks, benefits and alternatives. The patient understands, agrees and consents for the procedure. All questions were addressed. A time out was performed. Maximal barrier sterile technique utilized including caps, mask, sterile gowns, sterile gloves, large sterile drape, hand hygiene, and Betadine skin prep. The left groin was interrogated with ultrasound. The common femoral artery is found to be widely patent. An image was obtained and stored for the medical record. Local anesthesia was attained by infiltration with 1% lidocaine. Under real-time sonographic guidance, the vessel was punctured with a 21 gauge micropuncture needle. Using standard technique, the initial micro wire was exchanged through a transitional 5 [place] micro sheath for a working 0.035 inch Bentson wire. The micro sheath was then exchanged for a working 5 [place] vascular sheath. A C2 cobra catheter was advanced into the abdominal aorta over the wire. The catheter was initially advanced into the superior mesenteric artery and superior mesenteric arteriogram was performed. There is conventional anatomy. No replaced or accessory right hepatic artery. No evidence of active extravasation or significant collateralization through the pancreaticoduodenal arcade. The C2 cobra catheter was next advanced into the celiac artery. An arteriogram was performed. The gastroduodenal artery is  elongated and slightly irregular in the mid segment likely secondary to external mass effect. There is no evidence of active extravasation. A renegade ST micro catheter was advanced coaxially over a fat and 16 wire. The micro catheter was advanced into the gastroduodenal artery. Arteriography was again performed.  There are multiple branches arising from the gastroduodenal artery providing supply to a hypervascular mass in the region the descending duodenum. The micro catheter was carefully advanced more distally into the proximal right gastroepiploic artery. Contrast injection was performed confirming the location of the catheter tip as well as confirming no additional supplied to the region of tumor. Coil embolization was then performed of the gastroduodenal artery using a combination of vortex, standard helical and soft interlock detachable micro coils ranging in size from 2-6 mm. Post embolization arteriography confirms successful embolization of the vessel with no further visible hypervascular blush of the duodenum tumor. The micro catheter was removed. Repeat celiac artery injection was performed confirming no additional collateral supply. The 5 French catheter was readvanced of the superior mesenteric artery and an additional angiogram was performed again confirming no collateral flow to the embolized territory. The catheters were removed. The vascular sheath was left in place for hemodynamic monitoring. Ultrasound was again used to interrogate the left groin. The left common femoral vein was identified. Local anesthesia was attained by infiltration with 1% lidocaine. The left common femoral vein was punctured with an 18 gauge needle. A wire was advanced into the inferior vena cava. The skin tract was dilated and an arrow triple-lumen central venous catheter advanced over the wire and position with the tip at the confluence of the IVC and left common iliac vein. The catheter was flushed, capped and secured  with 0 Prolene suture. Sterile bandages were applied. COMPLICATIONS: None IMPRESSION: 1. No evidence of active extravasation. 2. 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 3. Coil embolization of the gastroduodenal artery. 4. Placement of a left femoral triple-lumen central venous catheter. The catheter tip is at the confluence of the IVC and iliac vein and ready for immediate use. Signed, Criselda Peaches, MD [facility] Radiology Electronically Signed   By: Jacqulynn Cadet M.D.   On: 01/23/2015 15:50   Portable Chest Xray  01/24/2015  CLINICAL DATA:  Respiratory failure. EXAM: PORTABLE CHEST 1 VIEW COMPARISON:  01/23/2015 FINDINGS: Endotracheal tube and NG tube in stable position. Cardiomegaly with normal pulmonary vascularity.Persistent increased density noted over the right Lower lung, most likely layering pleural effusion. Underlying pulmonary infiltrate cannot be excluded. Small left pleural effusion cannot be excluded. Low lung volumes basilar atelectasis. No pneumothorax. IMPRESSION: 1. Lines and tubes in stable position. 2. Persistent increased density of the right lower lung, most likely layering pleural effusion. Underlying infiltrate cannot be excluded. 3. Low lung volumes with basilar atelectasis. Electronically Signed   By: Marcello Moores  Register   On: 01/24/2015 07:39   [place]  01/23/2015  CLINICAL DATA:  64 year old male presents with acute life-threatening upper GI bleed secondary to an ulcerated mass encompassing the distal stomach and duodenum. 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 Endoscopic treatment options are limited and surgery is extremely high risk. Patient presents for emergent visceral arteriography and  embolization. Additionally, central venous access is required. A triple-lumen central venous catheter will be placed during the course of the procedure. EXAM: SELECTIVE VISCERAL ARTERIOGRAPHY; IR EMBO ART VEN HEMORR LYMPH EXTRAV INC GUIDE ROADMAPPING; IR LEFT FLOURO GUIDE CV LINE; ADDITIONAL ARTERIOGRAPHY; IR ULTRASOUND GUIDANCE VASC ACCESS LEFT; ARTERIOGRAPHY Date: 01/23/2015 PROCEDURE: 1. Ultrasound-guided puncture left common femoral artery 2. Catheterization of the superior mesenteric  artery with arteriogram 3. Catheterization of the celiac artery with arteriogram 4. Catheterization of the gastroduodenal artery with arteriogram 5. Catheterization of the right gastroepiploic artery with arteriogram 6. Coil embolization of the gastroduodenal artery 7. Post embolization arteriogram 8. Limited left common femoral arteriogram 9. Ultrasound-guided puncture of the left common femoral vein 10. Placement of a triple-lumen central venous catheter via the left femoral vein with fluoroscopic guidance Interventional Radiologist:  Criselda Peaches, MD ANESTHESIA/SEDATION: Moderate (conscious) sedation was used. 1 mg Versed, 50 mcg Fentanyl were administered intravenously. The patient's vital signs were monitored continuously by radiology nursing throughout the procedure. Sedation Time: 60 minutes MEDICATIONS: None additional FLUOROSCOPY TIME:  16 minutes for a total of 622 mGy CONTRAST:  192mL OMNIPAQUE IOHEXOL 300 MG/ML  SOLN TECHNIQUE: Informed consent was obtained from the patient following explanation of the procedure, risks, benefits and alternatives. The patient understands, agrees and consents for the procedure. All questions were addressed. A time out was performed. Maximal barrier sterile technique utilized including caps, mask, sterile gowns, sterile gloves, large sterile drape, hand hygiene, and Betadine skin prep. The left groin was interrogated with ultrasound. The common femoral artery is found to be widely  patent. An image was obtained and stored for the medical record. Local anesthesia was attained by infiltration with 1% lidocaine. Under real-time sonographic guidance, the vessel was punctured with a 21 gauge micropuncture needle. Using standard technique, the initial micro wire was exchanged through a transitional 5 [place] micro sheath for a working 0.035 inch Bentson wire. The micro sheath was then exchanged for a working 5 [place] vascular sheath. A C2 cobra catheter was advanced into the abdominal aorta over the wire. The catheter was initially advanced into the superior mesenteric artery and superior mesenteric arteriogram was performed. There is conventional anatomy. No replaced or accessory right hepatic artery. No evidence of active extravasation or significant collateralization through the pancreaticoduodenal arcade. The C2 cobra catheter was next advanced into the celiac artery. An arteriogram was performed. The gastroduodenal artery is elongated and slightly irregular in the mid segment likely secondary to external mass effect. There is no evidence of active extravasation. A renegade ST micro catheter was advanced coaxially over a fat and 16 wire. The micro catheter was advanced into the gastroduodenal artery. Arteriography was again performed. There are multiple branches arising from the gastroduodenal artery providing supply to a hypervascular mass in the region the descending duodenum. The micro catheter was carefully advanced more distally into the proximal right gastroepiploic artery. Contrast injection was performed confirming the location of the catheter tip as well as confirming no additional supplied to the region of tumor. Coil embolization was then performed of the gastroduodenal artery using a combination of vortex, standard helical and soft interlock detachable micro coils ranging in size from 2-6 mm. Post embolization arteriography confirms successful embolization of the vessel with no  further visible hypervascular blush of the duodenum tumor. The micro catheter was removed. Repeat celiac artery injection was performed confirming no additional collateral supply. The 5 French catheter was readvanced of the superior mesenteric artery and an additional angiogram was performed again confirming no collateral flow to the embolized territory. The catheters were removed. The vascular sheath was left in place for hemodynamic monitoring. Ultrasound was again used to interrogate the left groin. The left common femoral vein was identified. Local anesthesia was attained by infiltration with 1% lidocaine. The left common femoral vein was punctured with an 18 gauge needle. A wire was advanced into the inferior  vena cava. The skin tract was dilated and an arrow triple-lumen central venous catheter advanced over the wire and position with the tip at the confluence of the IVC and left common iliac vein. The catheter was flushed, capped and secured with 0 Prolene suture. Sterile bandages were applied. COMPLICATIONS: None IMPRESSION: 1. No evidence of active extravasation. 2. 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 3. Coil embolization of the gastroduodenal artery. 4. Placement of a left femoral triple-lumen central venous catheter. The catheter tip is at the confluence of the IVC and iliac vein and ready for immediate use. Signed, Criselda Peaches, MD [facility] Radiology Electronically Signed   By: Jacqulynn Cadet M.D.   On: 01/23/2015 15:50    Medications: I have reviewed the patient's current medications.  Assessment/Plan: 1. 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 Status post embolization by IR with treatment with continuous PPI infusion and octreotide infusion. Hemoglobin is stable and NG aspirate appears to be clear. He went home early  before and had a really bleed and I'm afraid would be at risk for having this again. I have discussed this with him. He obviously wants to go home for Christmas but would be at increased risk for re-bleeding. At this point I think it is reasonable to continue the PPI infusion and stop octreotide. We will stop NG suction and monitor him carefully. I will start clear liquids. If he is doing well tomorrow we can probably pull the NG tube.   Viola Kinnick JR,Olayinka Gathers L 01/25/2015, 9:59 AM  Pager: [phone] If no answer or after hours call [phone]

## 2015-01-25 NOTE — Progress Notes (Signed)
Pt had large watery bloody bowel movement, Hg is 7.4. Notified [place] physician; orders received for repeat CBC @ 0850. Will continue to monitor.

## 2015-01-25 NOTE — Progress Notes (Signed)
Chief Complaint: Mantle cell lymphoma  Referring Physician(s): Magrinat  History of Present Illness: [name] is a 64 y.o. male with history of hypertension, hyperlipidemia and recent syncopal episodes.   Patient was seen in ED on 12/14 after syncopal spell and found to be profoundly anemic with a hemoglobin 4.5.   Subsequent to transfuse and underwent EGD showing a very large duodenal ulcer with clot.   Subserosal CT abdomen pelvis on 12/17 showed adenopathy anterior mediastinum, retroperitoneum and pelvis with large ulcerated mass involving the distal stomach/proximal duodenum and extending into the gastroduodenal attic region.   On 12/19 the patient underwent right inguinal lymph node biopsy. Path revealed Mantle cell lymphoma.   He presented again on 12/20 with syncopal episode at home after eating grits and sausage.  The patient's wife stated that he experienced some abdominal fullness along with nausea and diaphoresis prior to the fall.   During intubation he vomited a large volume of blood in the stomach.   He underwent emergent mesenteric/visceral arteriogram with embolization by Dr. Laurence Ferrari on 12/20  We are now asked to place a Port A cath for chemotherapy.   Past Medical History  Diagnosis Date  . Essential hypertension   . HLD (hyperlipidemia)     Past Surgical History  Procedure Laterality Date  . Skin surgery      Small benign cysts over left scalp removed  . Esophagogastroduodenoscopy Left 01/18/2015    Procedure: ESOPHAGOGASTRODUODENOSCOPY (EGD);  Surgeon: Wilford Corner, MD;  Location: [facility] ENDOSCOPY;  Service: Endoscopy;  Laterality: Left;  . Inguinal hernia repair Right 01/22/2015    Procedure: RIGHT INGUINAL LYMPH NODE BX;  Surgeon: Donnie Mesa, MD;  Location: [place];  Service: General;  Laterality: Right;    Allergies: Review of patient's allergies indicates no known allergies.  Medications: Prior to Admission medications     Medication Sig Start Date End Date Taking? Authorizing Provider  omeprazole (PRILOSEC) 40 MG capsule Take 1 capsule (40 mg total) by mouth 2 (two) times daily. 01/22/15  Yes Bonnielee Haff, MD     Family History  Problem Relation Age of Onset  . Hypertension Mother   . Hypertension Father   . Hypertension Sister   . Diabetes Sister   . Prostate cancer Brother   . Lupus Sister   . Kidney failure Father     Social History   Social History  . Marital Status: Married    Spouse Name: N/A  . Number of Children: N/A  . Years of Education: N/A   Social History Main Topics  . Smoking status: Never Smoker   . Smokeless tobacco: None  . Alcohol Use: No  . Drug Use: No  . Sexual Activity: Not Asked   Other Topics Concern  . None   Social History Narrative     Review of Systems  Constitutional: Positive for fatigue. Negative for fever, chills, activity change and appetite change.  HENT: Negative.   Respiratory: Negative for cough and shortness of breath.   Cardiovascular: Negative for chest pain.  Gastrointestinal: Negative for nausea and abdominal pain.  Genitourinary: Negative.   Musculoskeletal: Negative.   Skin: Negative.   Neurological: Negative.   Psychiatric/Behavioral: Negative.     Vital Signs: BP 145/82 mmHg  Pulse 82  Temp(Src) 97.9 F (36.6 C) (Oral)  Resp 22  Ht 5\' 10"  (1.778 m)  Wt 213 lb 11.2 oz (96.934 kg)  BMI 30.66 kg/m2  SpO2 99%  Physical Exam  Constitutional: He is oriented  to person, place, and time. He appears well-developed and well-nourished.  HENT:  Head: Normocephalic and atraumatic.  Eyes: EOM are normal.  Neck: Normal range of motion. Neck supple.  Cardiovascular: Normal rate and regular rhythm.   No murmur heard. Pulmonary/Chest: Effort normal and breath sounds normal. No respiratory distress. He has no wheezes.  Abdominal: Soft. Bowel sounds are normal. He exhibits no distension. There is no tenderness.  Musculoskeletal:  Normal range of motion.  Neurological: He is alert and oriented to person, place, and time.  Skin: Skin is warm and dry.  Psychiatric: He has a normal mood and affect. His behavior is normal. Judgment and thought content normal.  Vitals reviewed.   Mallampati Score:  MD Evaluation Airway: WNL Heart: WNL Abdomen: WNL Chest/ Lungs: WNL ASA  Classification: 3 Mallampati/Airway Score: Two  Imaging: Ct Head Wo Contrast  01/23/2015  CLINICAL DATA:  64 year old male with fall. EXAM: CT HEAD WITHOUT CONTRAST CT CERVICAL SPINE WITHOUT CONTRAST TECHNIQUE: Multidetector CT imaging of the head and cervical spine was performed following the standard protocol without intravenous contrast. Multiplanar CT image reconstructions of the cervical spine were also generated. COMPARISON:  None. FINDINGS: CT HEAD FINDINGS The ventricles and sulci are appropriate in size for patient's age. Mild periventricular and deep white matter hypodensities represent chronic microvascular ischemic changes. There is no intracranial hemorrhage. No mass effect or midline shift identified. An endotracheal and enteric tube are partially visualized. There is diffuse mucoperiosteal thickening of the paranasal sinuses with partial opacification of the ethmoid air cells. The mastoid air cells are well aerated. Right occipital scalp hematoma. The calvarium is intact. CT CERVICAL SPINE FINDINGS There is no acute fracture or subluxation of the cervical spine.Mild multilevel degenerative changes.The odontoid and spinous processes are intact.There is normal anatomic alignment of the C1-C2 lateral masses. The visualized soft tissues appear unremarkable. An enteric tube and an endotracheal tube are partially visualized. Partially visualized bilateral pleural effusions, right greater than left. There is bilateral supraclavicular and right cervical adenopathy. Upper mediastinal lymphadenopathy noted. Chest CT is recommended for further evaluation.  Right supraclavicular soft tissue hemorrhage noted. IMPRESSION: No acute intracranial hemorrhage. 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 No acute/traumatic cervical spine pathology. Bilateral supraclavicular and right cervical as well as mediastinal adenopathy. Further evaluation recommended. Electronically Signed   By: Anner Crete M.D.   On: 01/23/2015 18:27   Ct Chest W Contrast  01/21/2015  CLINICAL DATA:  Mediastinal mass. EXAM: CT CHEST WITH CONTRAST TECHNIQUE: Multidetector CT imaging of the chest was performed during intravenous contrast administration. CONTRAST:  51mL OMNIPAQUE IOHEXOL 300 MG/ML  SOLN COMPARISON:  CT abdomen and pelvis dated 01/20/2015. FINDINGS: On yesterday's CT of the abdomen and pelvis, a large ulcerated mass involving the distal stomach and proximal duodenum was identified, with extension to the porta hepatis and gastrohepatic region, suggesting lymphoma. Associated lymphadenopathy was identified in the lower anterior mediastinum, retroperitoneum and pelvis, further suggesting lymphoma. Additional lymphadenopathy was suspected in the sub-carinal region of the mediastinum, incompletely imaged. On today's study, conglomerate subcarinal lymphadenopathy is confirmed, measuring 7.5 x 2.8 cm on image 33 of series 201. This conglomerate lymphadenopathy extends upwards within the mediastinum encasing the mid and lower portions of the trachea, without effacement of the trachea. At the level of the lower trachea, the conglomerate lymphadenopathy measures approximately 5.9 x 3.9 cm. Additional lymphadenopathy within the anterior mediastinum measures 4.4 x 2.3 cm (image 17, series 201). Additional smaller lymph nodes scattered throughout the mediastinum and bilateral  perihilar regions. Numerous small and moderately enlarged lymph nodes are seen within each axillary region. Clustered small and enlarged lymph nodes are seen within the supraclavicular regions  bilaterally. There is a right pleural effusion, small to moderate in size, with adjacent compressive atelectasis. There is also a small left pleural effusion with adjacent atelectasis. Very mild degenerative change noted within the cervical and thoracic spine. No acute osseous abnormality identified. Thoracic aorta is normal in caliber and configuration. Heart size is normal. No pericardial effusion. No central obstructing pulmonary embolism seen. Upper abdominal findings described on yesterday's CT. IMPRESSION: 1. Extensive mediastinal lymphadenopathy, bilateral axillary lymphadenopathy and supraclavicular lymphadenopathy. 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 2. Right pleural effusion, small to moderate in size, with adjacent atelectasis. Additional smaller left pleural effusion. Electronically Signed   By: Franki Cabot M.D.   On: 01/21/2015 16:47   Ct Cervical Spine Wo Contrast  01/23/2015  CLINICAL DATA:  64 year old male with fall. EXAM: CT HEAD WITHOUT CONTRAST CT CERVICAL SPINE WITHOUT CONTRAST TECHNIQUE: Multidetector CT imaging of the head and cervical spine was performed following the standard protocol without intravenous contrast. Multiplanar CT image reconstructions of the cervical spine were also generated. COMPARISON:  None. FINDINGS: CT HEAD FINDINGS The ventricles and sulci are appropriate in size for patient's age. Mild periventricular and deep white matter hypodensities represent chronic microvascular ischemic changes. There is no intracranial hemorrhage. No mass effect or midline shift identified. An endotracheal and enteric tube are partially visualized. There is diffuse mucoperiosteal thickening of the paranasal sinuses with partial opacification of the ethmoid air cells. The mastoid air cells are well aerated. Right occipital scalp hematoma. The calvarium is intact. CT CERVICAL SPINE FINDINGS  There is no acute fracture or subluxation of the cervical spine.Mild multilevel degenerative changes.The odontoid and spinous processes are intact.There is normal anatomic alignment of the C1-C2 lateral masses. The visualized soft tissues appear unremarkable. An enteric tube and an endotracheal tube are partially visualized. Partially visualized bilateral pleural effusions, right greater than left. There is bilateral supraclavicular and right cervical adenopathy. Upper mediastinal lymphadenopathy noted. Chest CT is recommended for further evaluation. Right supraclavicular soft tissue hemorrhage noted. IMPRESSION: No acute intracranial hemorrhage. 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 No acute/traumatic cervical spine pathology. Bilateral supraclavicular and right cervical as well as mediastinal adenopathy. Further evaluation recommended. Electronically Signed   By: Anner Crete M.D.   On: 01/23/2015 18:27   Ct Abdomen Pelvis W Contrast  01/20/2015  CLINICAL DATA:  GI bleed with syncopal episodes. Massive duodenal bulb ulcer by EGD. Abnormal abdominal ultrasound demonstrating heterogeneous soft tissue prominence in the region of the porta hepatis pancreatic head. EXAM: CT ABDOMEN AND PELVIS WITH CONTRAST TECHNIQUE: Multidetector CT imaging of the abdomen and pelvis was performed using the standard protocol following bolus administration of intravenous contrast. CONTRAST:  127mL OMNIPAQUE IOHEXOL 300 MG/ML  SOLN COMPARISON:  Right upper quadrant abdominal ultrasound on 01/19/2015. FINDINGS: In the lower chest, soft tissue mass in the anterior lower mediastinum abuts the pericardium and measures roughly 4.0 x 7.7 cm in axial dimensions. This is most likely consistent with a lymph node mass. The superior images also show potentially the bottom margin of subcarinal lymphadenopathy. There are small bilateral pleural effusions, right greater than left. In abdomen, a large confluent  mass is identified involving the distal stomach and proximal duodenum with visible ulceration. At the level of the proximal duodenum, thickening  of the duodenal wall approaches 3.5 cm. Soft tissue mass extends into the porta hepatis and gastrohepatic region and likely tracks along portal triads into the liver. Soft tissue mass also abuts and obscures definition of the pancreatic head and proximal pancreatic body. Mass also surrounds the portal vein, portal confluence and abuts the superior mesenteric vein. There is associated retroperitoneal lymphadenopathy in the lower abdomen and pelvis. Extensive bilateral iliac chain lymphadenopathy present. Elongated external iliac lymph node masses bilaterally measure roughly 1.8 cm in short axis. Multiple mildly enlarged bilateral inguinal lymph nodes also present. 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 No evidence of bowel obstruction or free intraperitoneal air. No focal abscess is identified. The gallbladder, distal pancreas, spleen, adrenal glands and kidneys appear unremarkable. No bony lesions are seen. The bladder is decompressed. No hernias are identified. IMPRESSION: Large ulcerated mass involving the distal stomach and proximal duodenum and extending into the porta hepatis and gastrohepatic region. Associated lymphadenopathy in the lower anterior mediastinum, retroperitoneum and pelvis. Also suspected subcarinal lymphadenopathy is partially visualized. 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 Electronically Signed   By: Aletta Edouard M.D.   On: 01/20/2015 13:50   Ir Angiogram Visceral Selective  01/23/2015  CLINICAL DATA:  64 year old male presents with acute life-threatening upper GI bleed secondary to an ulcerated mass encompassing the distal stomach and duodenum. The patient's underlying etiology is favored to represent lymphoma which should is  currently undergoing confirmatory diagnosis. Endoscopic treatment options are limited and surgery is extremely high risk. Patient presents for emergent visceral arteriography and embolization. Additionally, central venous access is required. A triple-lumen central venous catheter will be placed during the course of the procedure. EXAM: SELECTIVE VISCERAL ARTERIOGRAPHY; IR EMBO ART VEN HEMORR LYMPH EXTRAV INC GUIDE ROADMAPPING; IR LEFT FLOURO GUIDE CV LINE; ADDITIONAL ARTERIOGRAPHY; IR ULTRASOUND GUIDANCE VASC ACCESS LEFT; ARTERIOGRAPHY Date: 01/23/2015 PROCEDURE: 1. Ultrasound-guided puncture left common femoral artery 2. Catheterization of the superior mesenteric artery with arteriogram 3. Catheterization of the celiac artery with arteriogram 4. Catheterization of the gastroduodenal artery with arteriogram 5. Catheterization of the right gastroepiploic artery with arteriogram 6. Coil embolization of the gastroduodenal artery 7. Post embolization arteriogram 8. Limited left common femoral arteriogram 9. Ultrasound-guided puncture of the left common femoral vein 10. Placement of a triple-lumen central venous catheter via the left femoral vein with fluoroscopic guidance Interventional Radiologist:  Criselda Peaches, MD ANESTHESIA/SEDATION: Moderate (conscious) sedation was used. 1 mg Versed, 50 mcg Fentanyl were administered intravenously. The patient's vital signs were monitored continuously by radiology nursing throughout the procedure. Sedation Time: 60 minutes MEDICATIONS: None additional FLUOROSCOPY TIME:  16 minutes for a total of 622 mGy CONTRAST:  170mL OMNIPAQUE IOHEXOL 300 MG/ML  SOLN TECHNIQUE: Informed consent was obtained from the patient following explanation of the procedure, risks, benefits and alternatives. The patient understands, agrees and consents for the procedure. All questions were addressed. A time out was performed. Maximal barrier sterile technique utilized including caps, mask, sterile  gowns, sterile gloves, large sterile drape, hand hygiene, and Betadine skin prep. The left groin was interrogated with ultrasound. The common femoral artery is found to be widely patent. An image was obtained and stored for the medical record. Local anesthesia was attained by infiltration with 1% lidocaine. Under real-time sonographic guidance, the vessel was punctured with a 21 gauge micropuncture needle. Using standard technique, the initial micro wire was exchanged through a transitional 5 [place] micro sheath for a  working 0.035 Public affairs consultant. The micro sheath was then exchanged for a working 5 [place] vascular sheath. A C2 cobra catheter was advanced into the abdominal aorta over the wire. The catheter was initially advanced into the superior mesenteric artery and superior mesenteric arteriogram was performed. There is conventional anatomy. No replaced or accessory right hepatic artery. No evidence of active extravasation or significant collateralization through the pancreaticoduodenal arcade. The C2 cobra catheter was next advanced into the celiac artery. An arteriogram was performed. The gastroduodenal artery is elongated and slightly irregular in the mid segment likely secondary to external mass effect. There is no evidence of active extravasation. A renegade ST micro catheter was advanced coaxially over a fat and 16 wire. The micro catheter was advanced into the gastroduodenal artery. Arteriography was again performed. There are multiple branches arising from the gastroduodenal artery providing supply to a hypervascular mass in the region the descending duodenum. The micro catheter was carefully advanced more distally into the proximal right gastroepiploic artery. Contrast injection was performed confirming the location of the catheter tip as well as confirming no additional supplied to the region of tumor. Coil embolization was then performed of the gastroduodenal artery using a combination of vortex,  standard helical and soft interlock detachable micro coils ranging in size from 2-6 mm. Post embolization arteriography confirms successful embolization of the vessel with no further visible hypervascular blush of the duodenum tumor. The micro catheter was removed. Repeat celiac artery injection was performed confirming no additional collateral supply. The 5 French catheter was readvanced of the superior mesenteric artery and an additional angiogram was performed again confirming no collateral flow to the embolized territory. The catheters were removed. The vascular sheath was left in place for hemodynamic monitoring. Ultrasound was again used to interrogate the left groin. The left common femoral vein was identified. Local anesthesia was attained by infiltration with 1% lidocaine. The left common femoral vein was punctured with an 18 gauge needle. A wire was advanced into the inferior vena cava. The skin tract was dilated and an arrow triple-lumen central venous catheter advanced over the wire and position with the tip at the confluence of the IVC and left common iliac vein. The catheter was flushed, capped and secured with 0 Prolene suture. Sterile bandages were applied. COMPLICATIONS: None IMPRESSION: 1. No evidence of active extravasation. 2. 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 3. Coil embolization of the gastroduodenal artery. 4. Placement of a left femoral triple-lumen central venous catheter. The catheter tip is at the confluence of the IVC and iliac vein and ready for immediate use. Signed, Criselda Peaches, MD [facility] Electronically Signed   By: Jacqulynn Cadet M.D.   On: 01/23/2015 15:50   Ir Angiogram Visceral Selective  01/23/2015  CLINICAL DATA:  64 year old male presents with acute life-threatening upper GI bleed secondary to an ulcerated  mass encompassing the distal stomach and duodenum. 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 Endoscopic treatment options are limited and surgery is extremely high risk. Patient presents for emergent visceral arteriography and embolization. Additionally, central venous access is required. A triple-lumen central venous catheter will be placed during the course of the procedure. EXAM: SELECTIVE VISCERAL ARTERIOGRAPHY; IR EMBO ART VEN HEMORR LYMPH EXTRAV INC GUIDE ROADMAPPING; IR LEFT FLOURO GUIDE CV LINE; ADDITIONAL ARTERIOGRAPHY; IR ULTRASOUND GUIDANCE VASC ACCESS LEFT; ARTERIOGRAPHY Date: 01/23/2015  PROCEDURE: 1. Ultrasound-guided puncture left common femoral artery 2. Catheterization of the superior mesenteric artery with arteriogram 3. Catheterization of the celiac artery with arteriogram 4. Catheterization of the gastroduodenal artery with arteriogram 5. Catheterization of the right gastroepiploic artery with arteriogram 6. Coil embolization of the gastroduodenal artery 7. Post embolization arteriogram 8. Limited left common femoral arteriogram 9. Ultrasound-guided puncture of the left common femoral vein 10. Placement of a triple-lumen central venous catheter via the left femoral vein with fluoroscopic guidance Interventional Radiologist:  Criselda Peaches, MD ANESTHESIA/SEDATION: Moderate (conscious) sedation was used. 1 mg Versed, 50 mcg Fentanyl were administered intravenously. The patient's vital signs were monitored continuously by radiology nursing throughout the procedure. Sedation Time: 60 minutes MEDICATIONS: None additional FLUOROSCOPY TIME:  16 minutes for a total of 622 mGy CONTRAST:  143mL OMNIPAQUE IOHEXOL 300 MG/ML  SOLN TECHNIQUE: Informed consent was obtained from the patient following explanation of the procedure, risks, benefits and alternatives. The patient understands, agrees and consents for the  procedure. All questions were addressed. A time out was performed. Maximal barrier sterile technique utilized including caps, mask, sterile gowns, sterile gloves, large sterile drape, hand hygiene, and Betadine skin prep. The left groin was interrogated with ultrasound. The common femoral artery is found to be widely patent. An image was obtained and stored for the medical record. Local anesthesia was attained by infiltration with 1% lidocaine. Under real-time sonographic guidance, the vessel was punctured with a 21 gauge micropuncture needle. Using standard technique, the initial micro wire was exchanged through a transitional 5 [place] micro sheath for a working 0.035 inch Bentson wire. The micro sheath was then exchanged for a working 5 [place] vascular sheath. A C2 cobra catheter was advanced into the abdominal aorta over the wire. The catheter was initially advanced into the superior mesenteric artery and superior mesenteric arteriogram was performed. There is conventional anatomy. No replaced or accessory right hepatic artery. No evidence of active extravasation or significant collateralization through the pancreaticoduodenal arcade. The C2 cobra catheter was next advanced into the celiac artery. An arteriogram was performed. The gastroduodenal artery is elongated and slightly irregular in the mid segment likely secondary to external mass effect. There is no evidence of active extravasation. A renegade ST micro catheter was advanced coaxially over a fat and 16 wire. The micro catheter was advanced into the gastroduodenal artery. Arteriography was again performed. There are multiple branches arising from the gastroduodenal artery providing supply to a hypervascular mass in the region the descending duodenum. The micro catheter was carefully advanced more distally into the proximal right gastroepiploic artery. Contrast injection was performed confirming the location of the catheter tip as well as confirming no  additional supplied to the region of tumor. Coil embolization was then performed of the gastroduodenal artery using a combination of vortex, standard helical and soft interlock detachable micro coils ranging in size from 2-6 mm. Post embolization arteriography confirms successful embolization of the vessel with no further visible hypervascular blush of the duodenum tumor. The micro catheter was removed. Repeat celiac artery injection was performed confirming no additional collateral supply. The 5 French catheter was readvanced of the superior mesenteric artery and an additional angiogram was performed again confirming no collateral flow to the embolized territory. The catheters were removed. The vascular sheath was left in place for hemodynamic monitoring. Ultrasound was again used to interrogate the left groin. The left common femoral vein was identified. Local anesthesia was attained by infiltration with 1% lidocaine. The left common femoral vein  was punctured with an 18 gauge needle. A wire was advanced into the inferior vena cava. The skin tract was dilated and an arrow triple-lumen central venous catheter advanced over the wire and position with the tip at the confluence of the IVC and left common iliac vein. The catheter was flushed, capped and secured with 0 Prolene suture. Sterile bandages were applied. COMPLICATIONS: None IMPRESSION: 1. No evidence of active extravasation. 2. 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 3. Coil embolization of the gastroduodenal artery. 4. Placement of a left femoral triple-lumen central venous catheter. The catheter tip is at the confluence of the IVC and iliac vein and ready for immediate use. Signed, Criselda Peaches, MD [facility] Radiology Electronically Signed   By: Jacqulynn Cadet M.D.   On: 01/23/2015 15:50   Ir Angiogram  Selective Each Additional Vessel  01/23/2015  CLINICAL DATA:  64 year old male presents with acute life-threatening upper GI bleed secondary to an ulcerated mass encompassing the distal stomach and duodenum. 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 Endoscopic treatment options are limited and surgery is extremely high risk. Patient presents for emergent visceral arteriography and embolization. Additionally, central venous access is required. A triple-lumen central venous catheter will be placed during the course of the procedure. EXAM: SELECTIVE VISCERAL ARTERIOGRAPHY; IR EMBO ART VEN HEMORR LYMPH EXTRAV INC GUIDE ROADMAPPING; IR LEFT FLOURO GUIDE CV LINE; ADDITIONAL ARTERIOGRAPHY; IR ULTRASOUND GUIDANCE VASC ACCESS LEFT; ARTERIOGRAPHY Date: 01/23/2015 PROCEDURE: 1. Ultrasound-guided puncture left common femoral artery 2. Catheterization of the superior mesenteric artery with arteriogram 3. Catheterization of the celiac artery with arteriogram 4. Catheterization of the gastroduodenal artery with arteriogram 5. Catheterization of the right gastroepiploic artery with arteriogram 6. Coil embolization of the gastroduodenal artery 7. Post embolization arteriogram 8. Limited left common femoral arteriogram 9. Ultrasound-guided puncture of the left common femoral vein 10. Placement of a triple-lumen central venous catheter via the left femoral vein with fluoroscopic guidance Interventional Radiologist:  Criselda Peaches, MD ANESTHESIA/SEDATION: Moderate (conscious) sedation was used. 1 mg Versed, 50 mcg Fentanyl were administered intravenously. The patient's vital signs were monitored continuously by radiology nursing throughout the procedure. Sedation Time: 60 minutes MEDICATIONS: None additional FLUOROSCOPY TIME:  16 minutes for a total of 622 mGy CONTRAST:  173mL OMNIPAQUE IOHEXOL 300 MG/ML  SOLN TECHNIQUE: Informed consent was  obtained from the patient following explanation of the procedure, risks, benefits and alternatives. The patient understands, agrees and consents for the procedure. All questions were addressed. A time out was performed. Maximal barrier sterile technique utilized including caps, mask, sterile gowns, sterile gloves, large sterile drape, hand hygiene, and Betadine skin prep. The left groin was interrogated with ultrasound. The common femoral artery is found to be widely patent. An image was obtained and stored for the medical record. Local anesthesia was attained by infiltration with 1% lidocaine. Under real-time sonographic guidance, the vessel was punctured with a 21 gauge micropuncture needle. Using standard technique, the initial micro wire was exchanged through a transitional 5 [place] micro sheath for a working 0.035 inch Bentson wire. The micro sheath was then exchanged for a working 5 [place] vascular sheath. A C2 cobra catheter was advanced into the abdominal aorta over the wire. The catheter was initially advanced into the superior mesenteric artery and superior mesenteric arteriogram was performed. There is conventional anatomy. No replaced or accessory right  hepatic artery. No evidence of active extravasation or significant collateralization through the pancreaticoduodenal arcade. The C2 cobra catheter was next advanced into the celiac artery. An arteriogram was performed. The gastroduodenal artery is elongated and slightly irregular in the mid segment likely secondary to external mass effect. There is no evidence of active extravasation. A renegade ST micro catheter was advanced coaxially over a fat and 16 wire. The micro catheter was advanced into the gastroduodenal artery. Arteriography was again performed. There are multiple branches arising from the gastroduodenal artery providing supply to a hypervascular mass in the region the descending duodenum. The micro catheter was carefully advanced more distally  into the proximal right gastroepiploic artery. Contrast injection was performed confirming the location of the catheter tip as well as confirming no additional supplied to the region of tumor. Coil embolization was then performed of the gastroduodenal artery using a combination of vortex, standard helical and soft interlock detachable micro coils ranging in size from 2-6 mm. Post embolization arteriography confirms successful embolization of the vessel with no further visible hypervascular blush of the duodenum tumor. The micro catheter was removed. Repeat celiac artery injection was performed confirming no additional collateral supply. The 5 French catheter was readvanced of the superior mesenteric artery and an additional angiogram was performed again confirming no collateral flow to the embolized territory. The catheters were removed. The vascular sheath was left in place for hemodynamic monitoring. Ultrasound was again used to interrogate the left groin. The left common femoral vein was identified. Local anesthesia was attained by infiltration with 1% lidocaine. The left common femoral vein was punctured with an 18 gauge needle. A wire was advanced into the inferior vena cava. The skin tract was dilated and an arrow triple-lumen central venous catheter advanced over the wire and position with the tip at the confluence of the IVC and left common iliac vein. The catheter was flushed, capped and secured with 0 Prolene suture. Sterile bandages were applied. COMPLICATIONS: None IMPRESSION: 1. No evidence of active extravasation. 2. 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 3. Coil embolization of the gastroduodenal artery. 4. Placement of a left femoral triple-lumen central venous catheter. The catheter tip is at the confluence of the IVC and iliac vein and ready for immediate use. Signed, Criselda Peaches, MD [facility] Radiology Electronically Signed   By: Jacqulynn Cadet M.D.   On: 01/23/2015 15:50   Ir Angiogram Follow Up Study  01/23/2015  CLINICAL DATA:  64 year old male presents with acute life-threatening upper GI bleed secondary to an ulcerated mass encompassing the distal stomach and duodenum. 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 Endoscopic treatment options are limited and surgery is extremely high risk. Patient presents for emergent visceral arteriography and embolization. Additionally, central venous access is required. A triple-lumen central venous catheter will be placed during the course of the procedure. EXAM: SELECTIVE VISCERAL ARTERIOGRAPHY; IR EMBO ART VEN HEMORR LYMPH EXTRAV INC GUIDE ROADMAPPING; IR LEFT FLOURO GUIDE CV LINE; ADDITIONAL ARTERIOGRAPHY; IR ULTRASOUND GUIDANCE VASC ACCESS LEFT; ARTERIOGRAPHY Date: 01/23/2015 PROCEDURE: 1. Ultrasound-guided puncture left common femoral artery 2. Catheterization of the superior mesenteric artery with arteriogram 3. Catheterization of the celiac artery with arteriogram 4. Catheterization of the gastroduodenal artery with arteriogram 5. Catheterization of the right gastroepiploic artery with arteriogram 6. Coil embolization of the gastroduodenal artery 7. Post embolization arteriogram 8. Limited  left common femoral arteriogram 9. Ultrasound-guided puncture of the left common femoral vein 10. Placement of a triple-lumen central venous catheter via the left femoral vein with fluoroscopic guidance Interventional Radiologist:  Criselda Peaches, MD ANESTHESIA/SEDATION: Moderate (conscious) sedation was used. 1 mg Versed, 50 mcg Fentanyl were administered intravenously. The patient's vital signs were monitored continuously by radiology nursing throughout the procedure. Sedation Time: 60 minutes MEDICATIONS: None additional  FLUOROSCOPY TIME:  16 minutes for a total of 622 mGy CONTRAST:  118mL OMNIPAQUE IOHEXOL 300 MG/ML  SOLN TECHNIQUE: Informed consent was obtained from the patient following explanation of the procedure, risks, benefits and alternatives. The patient understands, agrees and consents for the procedure. All questions were addressed. A time out was performed. Maximal barrier sterile technique utilized including caps, mask, sterile gowns, sterile gloves, large sterile drape, hand hygiene, and Betadine skin prep. The left groin was interrogated with ultrasound. The common femoral artery is found to be widely patent. An image was obtained and stored for the medical record. Local anesthesia was attained by infiltration with 1% lidocaine. Under real-time sonographic guidance, the vessel was punctured with a 21 gauge micropuncture needle. Using standard technique, the initial micro wire was exchanged through a transitional 5 [place] micro sheath for a working 0.035 inch Bentson wire. The micro sheath was then exchanged for a working 5 [place] vascular sheath. A C2 cobra catheter was advanced into the abdominal aorta over the wire. The catheter was initially advanced into the superior mesenteric artery and superior mesenteric arteriogram was performed. There is conventional anatomy. No replaced or accessory right hepatic artery. No evidence of active extravasation or significant collateralization through the pancreaticoduodenal arcade. The C2 cobra catheter was next advanced into the celiac artery. An arteriogram was performed. The gastroduodenal artery is elongated and slightly irregular in the mid segment likely secondary to external mass effect. There is no evidence of active extravasation. A renegade ST micro catheter was advanced coaxially over a fat and 16 wire. The micro catheter was advanced into the gastroduodenal artery. Arteriography was again performed. There are multiple branches arising from the gastroduodenal artery  providing supply to a hypervascular mass in the region the descending duodenum. The micro catheter was carefully advanced more distally into the proximal right gastroepiploic artery. Contrast injection was performed confirming the location of the catheter tip as well as confirming no additional supplied to the region of tumor. Coil embolization was then performed of the gastroduodenal artery using a combination of vortex, standard helical and soft interlock detachable micro coils ranging in size from 2-6 mm. Post embolization arteriography confirms successful embolization of the vessel with no further visible hypervascular blush of the duodenum tumor. The micro catheter was removed. Repeat celiac artery injection was performed confirming no additional collateral supply. The 5 French catheter was readvanced of the superior mesenteric artery and an additional angiogram was performed again confirming no collateral flow to the embolized territory. The catheters were removed. The vascular sheath was left in place for hemodynamic monitoring. Ultrasound was again used to interrogate the left groin. The left common femoral vein was identified. Local anesthesia was attained by infiltration with 1% lidocaine. The left common femoral vein was punctured with an 18 gauge needle. A wire was advanced into the inferior vena cava. The skin tract was dilated and an arrow triple-lumen central venous catheter advanced over the wire and position with the tip at the confluence of the IVC and left common iliac vein. The catheter was flushed, capped and secured  with 0 Prolene suture. Sterile bandages were applied. COMPLICATIONS: None IMPRESSION: 1. No evidence of active extravasation. 2. 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 3. Coil embolization of the gastroduodenal artery. 4. Placement of a left femoral triple-lumen central venous catheter. The  catheter tip is at the confluence of the IVC and iliac vein and ready for immediate use. Signed, Criselda Peaches, MD [facility] Radiology Electronically Signed   By: Jacqulynn Cadet M.D.   On: 01/23/2015 15:50   Ir Fluoro Guide Cv Line Left  01/23/2015  CLINICAL DATA:  64 year old male presents with acute life-threatening upper GI bleed secondary to an ulcerated mass encompassing the distal stomach and duodenum. 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 Endoscopic treatment options are limited and surgery is extremely high risk. Patient presents for emergent visceral arteriography and embolization. Additionally, central venous access is required. A triple-lumen central venous catheter will be placed during the course of the procedure. EXAM: SELECTIVE VISCERAL ARTERIOGRAPHY; IR EMBO ART VEN HEMORR LYMPH EXTRAV INC GUIDE ROADMAPPING; IR LEFT FLOURO GUIDE CV LINE; ADDITIONAL ARTERIOGRAPHY; IR ULTRASOUND GUIDANCE VASC ACCESS LEFT; ARTERIOGRAPHY Date: 01/23/2015 PROCEDURE: 1. Ultrasound-guided puncture left common femoral artery 2. Catheterization of the superior mesenteric artery with arteriogram 3. Catheterization of the celiac artery with arteriogram 4. Catheterization of the gastroduodenal artery with arteriogram 5. Catheterization of the right gastroepiploic artery with arteriogram 6. Coil embolization of the gastroduodenal artery 7. Post embolization arteriogram 8. Limited left common femoral arteriogram 9. Ultrasound-guided puncture of the left common femoral vein 10. Placement of a triple-lumen central venous catheter via the left femoral vein with fluoroscopic guidance Interventional Radiologist:  Criselda Peaches, MD ANESTHESIA/SEDATION: Moderate (conscious) sedation was used. 1 mg Versed, 50 mcg Fentanyl were administered intravenously. The patient's vital signs  were monitored continuously by radiology nursing throughout the procedure. Sedation Time: 60 minutes MEDICATIONS: None additional FLUOROSCOPY TIME:  16 minutes for a total of 622 mGy CONTRAST:  180mL OMNIPAQUE IOHEXOL 300 MG/ML  SOLN TECHNIQUE: Informed consent was obtained from the patient following explanation of the procedure, risks, benefits and alternatives. The patient understands, agrees and consents for the procedure. All questions were addressed. A time out was performed. Maximal barrier sterile technique utilized including caps, mask, sterile gowns, sterile gloves, large sterile drape, hand hygiene, and Betadine skin prep. The left groin was interrogated with ultrasound. The common femoral artery is found to be widely patent. An image was obtained and stored for the medical record. Local anesthesia was attained by infiltration with 1% lidocaine. Under real-time sonographic guidance, the vessel was punctured with a 21 gauge micropuncture needle. Using standard technique, the initial micro wire was exchanged through a transitional 5 [place] micro sheath for a working 0.035 inch Bentson wire. The micro sheath was then exchanged for a working 5 [place] vascular sheath. A C2 cobra catheter was advanced into the abdominal aorta over the wire. The catheter was initially advanced into the superior mesenteric artery and superior mesenteric arteriogram was performed. There is conventional anatomy. No replaced or accessory right hepatic artery. No evidence of active extravasation or significant collateralization through the pancreaticoduodenal arcade. The C2 cobra catheter was next advanced into the celiac artery. An arteriogram was performed. The gastroduodenal artery is elongated and slightly irregular in the mid segment likely secondary to external mass effect. There is no evidence of active extravasation. A  renegade ST micro catheter was advanced coaxially over a fat and 16 wire. The micro catheter was advanced  into the gastroduodenal artery. Arteriography was again performed. There are multiple branches arising from the gastroduodenal artery providing supply to a hypervascular mass in the region the descending duodenum. The micro catheter was carefully advanced more distally into the proximal right gastroepiploic artery. Contrast injection was performed confirming the location of the catheter tip as well as confirming no additional supplied to the region of tumor. Coil embolization was then performed of the gastroduodenal artery using a combination of vortex, standard helical and soft interlock detachable micro coils ranging in size from 2-6 mm. Post embolization arteriography confirms successful embolization of the vessel with no further visible hypervascular blush of the duodenum tumor. The micro catheter was removed. Repeat celiac artery injection was performed confirming no additional collateral supply. The 5 French catheter was readvanced of the superior mesenteric artery and an additional angiogram was performed again confirming no collateral flow to the embolized territory. The catheters were removed. The vascular sheath was left in place for hemodynamic monitoring. Ultrasound was again used to interrogate the left groin. The left common femoral vein was identified. Local anesthesia was attained by infiltration with 1% lidocaine. The left common femoral vein was punctured with an 18 gauge needle. A wire was advanced into the inferior vena cava. The skin tract was dilated and an arrow triple-lumen central venous catheter advanced over the wire and position with the tip at the confluence of the IVC and left common iliac vein. The catheter was flushed, capped and secured with 0 Prolene suture. Sterile bandages were applied. COMPLICATIONS: None IMPRESSION: 1. No evidence of active extravasation. 2. Positive hypervascular tumor blush in the region of the descending duodenum with arterial supply from the gastroduodenal  artery. 3. Coil embolization of the gastroduodenal artery. 4. Placement of a left femoral triple-lumen central venous catheter. The catheter tip is at the confluence of the IVC and iliac vein and ready for immediate use. Signed, Criselda Peaches, MD [facility] Radiology Electronically Signed   By: Jacqulynn Cadet M.D.   On: 01/23/2015 15:50   Ir US Guide Vasc Access Left  01/23/2015  CLINICAL DATA:  64 year old male presents with acute life-threatening upper GI bleed secondary to an ulcerated mass encompassing the distal stomach and duodenum. 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 Endoscopic treatment options are limited and surgery is extremely high risk. Patient presents for emergent visceral arteriography and embolization. Additionally, central venous access is required. A triple-lumen central venous catheter will be placed during the course of the procedure. EXAM: SELECTIVE VISCERAL ARTERIOGRAPHY; IR EMBO ART VEN HEMORR LYMPH EXTRAV INC GUIDE ROADMAPPING; IR LEFT FLOURO GUIDE CV LINE; ADDITIONAL ARTERIOGRAPHY; IR ULTRASOUND GUIDANCE VASC ACCESS LEFT; ARTERIOGRAPHY Date: 01/23/2015 PROCEDURE: 1. Ultrasound-guided puncture left common femoral artery 2. Catheterization of the superior mesenteric artery with arteriogram 3. Catheterization of the celiac artery with arteriogram 4. Catheterization of the gastroduodenal artery with arteriogram 5. Catheterization of the right gastroepiploic artery with arteriogram 6. Coil embolization of the gastroduodenal artery 7. Post embolization arteriogram 8. Limited left common femoral arteriogram 9. Ultrasound-guided puncture of the left common femoral vein 10. Placement of a triple-lumen central venous catheter via the left femoral vein with fluoroscopic guidance Interventional Radiologist:  Criselda Peaches, MD  ANESTHESIA/SEDATION: Moderate (conscious) sedation was used. 1 mg Versed, 50 mcg Fentanyl were administered intravenously. The patient's  vital signs were monitored continuously by radiology nursing throughout the procedure. Sedation Time: 60 minutes MEDICATIONS: None additional FLUOROSCOPY TIME:  16 minutes for a total of 622 mGy CONTRAST:  168mL OMNIPAQUE IOHEXOL 300 MG/ML  SOLN TECHNIQUE: Informed consent was obtained from the patient following explanation of the procedure, risks, benefits and alternatives. The patient understands, agrees and consents for the procedure. All questions were addressed. A time out was performed. Maximal barrier sterile technique utilized including caps, mask, sterile gowns, sterile gloves, large sterile drape, hand hygiene, and Betadine skin prep. The left groin was interrogated with ultrasound. The common femoral artery is found to be widely patent. An image was obtained and stored for the medical record. Local anesthesia was attained by infiltration with 1% lidocaine. Under real-time sonographic guidance, the vessel was punctured with a 21 gauge micropuncture needle. Using standard technique, the initial micro wire was exchanged through a transitional 5 [place] micro sheath for a working 0.035 inch Bentson wire. The micro sheath was then exchanged for a working 5 [place] vascular sheath. A C2 cobra catheter was advanced into the abdominal aorta over the wire. The catheter was initially advanced into the superior mesenteric artery and superior mesenteric arteriogram was performed. There is conventional anatomy. No replaced or accessory right hepatic artery. No evidence of active extravasation or significant collateralization through the pancreaticoduodenal arcade. The C2 cobra catheter was next advanced into the celiac artery. An arteriogram was performed. The gastroduodenal artery is elongated and slightly irregular in the mid segment likely secondary to external mass effect. There is  no evidence of active extravasation. A renegade ST micro catheter was advanced coaxially over a fat and 16 wire. The micro catheter was advanced into the gastroduodenal artery. Arteriography was again performed. There are multiple branches arising from the gastroduodenal artery providing supply to a hypervascular mass in the region the descending duodenum. The micro catheter was carefully advanced more distally into the proximal right gastroepiploic artery. Contrast injection was performed confirming the location of the catheter tip as well as confirming no additional supplied to the region of tumor. Coil embolization was then performed of the gastroduodenal artery using a combination of vortex, standard helical and soft interlock detachable micro coils ranging in size from 2-6 mm. Post embolization arteriography confirms successful embolization of the vessel with no further visible hypervascular blush of the duodenum tumor. The micro catheter was removed. Repeat celiac artery injection was performed confirming no additional collateral supply. The 5 French catheter was readvanced of the superior mesenteric artery and an additional angiogram was performed again confirming no collateral flow to the embolized territory. The catheters were removed. The vascular sheath was left in place for hemodynamic monitoring. Ultrasound was again used to interrogate the left groin. The left common femoral vein was identified. Local anesthesia was attained by infiltration with 1% lidocaine. The left common femoral vein was punctured with an 18 gauge needle. A wire was advanced into the inferior vena cava. The skin tract was dilated and an arrow triple-lumen central venous catheter advanced over the wire and position with the tip at the confluence of the IVC and left common iliac vein. The catheter was flushed, capped and secured with 0 Prolene suture. Sterile bandages were applied. COMPLICATIONS: None IMPRESSION: 1. No evidence of  active extravasation. 2. 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 3. Coil embolization of the gastroduodenal artery. 4. Placement of a left femoral triple-lumen central venous catheter.  The catheter tip is at the confluence of the IVC and iliac vein and ready for immediate use. Signed, Criselda Peaches, MD [facility] Radiology Electronically Signed   By: Jacqulynn Cadet M.D.   On: 01/23/2015 15:50   Ir US Guide Vasc Access Left  01/23/2015  CLINICAL DATA:  64 year old male presents with acute life-threatening upper GI bleed secondary to an ulcerated mass encompassing the distal stomach and duodenum. 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 Endoscopic treatment options are limited and surgery is extremely high risk. Patient presents for emergent visceral arteriography and embolization. Additionally, central venous access is required. A triple-lumen central venous catheter will be placed during the course of the procedure. EXAM: SELECTIVE VISCERAL ARTERIOGRAPHY; IR EMBO ART VEN HEMORR LYMPH EXTRAV INC GUIDE ROADMAPPING; IR LEFT FLOURO GUIDE CV LINE; ADDITIONAL ARTERIOGRAPHY; IR ULTRASOUND GUIDANCE VASC ACCESS LEFT; ARTERIOGRAPHY Date: 01/23/2015 PROCEDURE: 1. Ultrasound-guided puncture left common femoral artery 2. Catheterization of the superior mesenteric artery with arteriogram 3. Catheterization of the celiac artery with arteriogram 4. Catheterization of the gastroduodenal artery with arteriogram 5. Catheterization of the right gastroepiploic artery with arteriogram 6. Coil embolization of the gastroduodenal artery 7. Post embolization arteriogram 8. Limited left common femoral arteriogram 9. Ultrasound-guided puncture of the left common femoral vein 10. Placement of a triple-lumen  central venous catheter via the left femoral vein with fluoroscopic guidance Interventional Radiologist:  Criselda Peaches, MD ANESTHESIA/SEDATION: Moderate (conscious) sedation was used. 1 mg Versed, 50 mcg Fentanyl were administered intravenously. The patient's vital signs were monitored continuously by radiology nursing throughout the procedure. Sedation Time: 60 minutes MEDICATIONS: None additional FLUOROSCOPY TIME:  16 minutes for a total of 622 mGy CONTRAST:  164mL OMNIPAQUE IOHEXOL 300 MG/ML  SOLN TECHNIQUE: Informed consent was obtained from the patient following explanation of the procedure, risks, benefits and alternatives. The patient understands, agrees and consents for the procedure. All questions were addressed. A time out was performed. Maximal barrier sterile technique utilized including caps, mask, sterile gowns, sterile gloves, large sterile drape, hand hygiene, and Betadine skin prep. The left groin was interrogated with ultrasound. The common femoral artery is found to be widely patent. An image was obtained and stored for the medical record. Local anesthesia was attained by infiltration with 1% lidocaine. Under real-time sonographic guidance, the vessel was punctured with a 21 gauge micropuncture needle. Using standard technique, the initial micro wire was exchanged through a transitional 5 [place] micro sheath for a working 0.035 inch Bentson wire. The micro sheath was then exchanged for a working 5 [place] vascular sheath. A C2 cobra catheter was advanced into the abdominal aorta over the wire. The catheter was initially advanced into the superior mesenteric artery and superior mesenteric arteriogram was performed. There is conventional anatomy. No replaced or accessory right hepatic artery. No evidence of active extravasation or significant collateralization through the pancreaticoduodenal arcade. The C2 cobra catheter was next advanced into the celiac artery. An arteriogram was performed.  The gastroduodenal artery is elongated and slightly irregular in the mid segment likely secondary to external mass effect. There is no evidence of active extravasation. A renegade ST micro catheter was advanced coaxially over a fat and 16 wire. The micro catheter was advanced into the gastroduodenal artery. Arteriography was again performed. There are multiple branches arising from the gastroduodenal artery providing supply to a hypervascular mass in the region the descending duodenum. The micro catheter was carefully advanced  more distally into the proximal right gastroepiploic artery. Contrast injection was performed confirming the location of the catheter tip as well as confirming no additional supplied to the region of tumor. Coil embolization was then performed of the gastroduodenal artery using a combination of vortex, standard helical and soft interlock detachable micro coils ranging in size from 2-6 mm. Post embolization arteriography confirms successful embolization of the vessel with no further visible hypervascular blush of the duodenum tumor. The micro catheter was removed. Repeat celiac artery injection was performed confirming no additional collateral supply. The 5 French catheter was readvanced of the superior mesenteric artery and an additional angiogram was performed again confirming no collateral flow to the embolized territory. The catheters were removed. The vascular sheath was left in place for hemodynamic monitoring. Ultrasound was again used to interrogate the left groin. The left common femoral vein was identified. Local anesthesia was attained by infiltration with 1% lidocaine. The left common femoral vein was punctured with an 18 gauge needle. A wire was advanced into the inferior vena cava. The skin tract was dilated and an arrow triple-lumen central venous catheter advanced over the wire and position with the tip at the confluence of the IVC and left common iliac vein. The catheter was  flushed, capped and secured with 0 Prolene suture. Sterile bandages were applied. COMPLICATIONS: None IMPRESSION: 1. No evidence of active extravasation. 2. 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 3. Coil embolization of the gastroduodenal artery. 4. Placement of a left femoral triple-lumen central venous catheter. The catheter tip is at the confluence of the IVC and iliac vein and ready for immediate use. Signed, Criselda Peaches, MD [facility] Radiology Electronically Signed   By: Jacqulynn Cadet M.D.   On: 01/23/2015 15:50   Portable Chest Xray  01/24/2015  CLINICAL DATA:  Respiratory failure. EXAM: PORTABLE CHEST 1 VIEW COMPARISON:  01/23/2015 FINDINGS: Endotracheal tube and NG tube in stable position. Cardiomegaly with normal pulmonary vascularity.Persistent increased density noted over the right Lower lung, most likely layering pleural effusion. Underlying pulmonary infiltrate cannot be excluded. Small left pleural effusion cannot be excluded. Low lung volumes basilar atelectasis. No pneumothorax. IMPRESSION: 1. Lines and tubes in stable position. 2. Persistent increased density of the right lower lung, most likely layering pleural effusion. Underlying infiltrate cannot be excluded. 3. Low lung volumes with basilar atelectasis. Electronically Signed   By: Marcello Moores  Register   On: 01/24/2015 07:39   Dg Chest Portable 1 View  01/23/2015  CLINICAL DATA:  Assess orogastric tube positioning EXAM: PORTABLE CHEST 1 VIEW COMPARISON:  Portable chest x-ray of earlier today at 8:01 a.m. FINDINGS: The esophagogastric tube tip projects below the inferior margin of the image. There is no evidence of cord ongoing within the esophagus. The endotracheal tube tip lies 4.1 cm above the carina. The cardiac silhouette is mildly enlarged but stable. The central pulmonary vascularity  is engorged especially on the right. There is mild to moderate pulmonary interstitial edema on the right. There is no pleural effusion or pneumothorax. The observed bony thorax exhibits no acute abnormality. External pacemaker -defibrillator pads. Are present IMPRESSION: 1. The esophagogastric tube tip projects below the inferior margin of the image with no evidence of coiling within the esophagus. The endotracheal tube is in appropriate position. 2. Persistently increased perihilar interstitial density especially on the right consistent with pulmonary interstitial edema. There may be pleural fluid on the  right layering posteriorly contributing to the findings here. Electronically Signed   By: David  Martinique M.D.   On: 01/23/2015 09:22   Dg Chest Port 1 View  01/23/2015  CLINICAL DATA:  Status post intubation.  Intra abdominal mass. EXAM: PORTABLE CHEST 1 VIEW COMPARISON:  CT chest 01/21/2015.  CT abdomen 01/21/2015. FINDINGS: The patient has a new endotracheal tube in place with the tip 3.5 cm above the carina. Hazy opacity over the right chest is consistent with layering pleural effusion. Smaller left pleural effusion is noted. No consolidative process or pneumothorax. Heart size is upper normal. IMPRESSION: ET tube in good position with the tip projecting 3.5 cm above the carina. Right greater than left pleural effusions. Electronically Signed   By: Inge Rise M.D.   On: 01/23/2015 08:19   [place] Guide Roadmapping  01/23/2015  CLINICAL DATA:  64 year old male presents with acute life-threatening upper GI bleed secondary to an ulcerated mass encompassing the distal stomach and duodenum. 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 Endoscopic treatment options are limited and surgery is extremely high risk. Patient presents for emergent visceral arteriography and embolization.  Additionally, central venous access is required. A triple-lumen central venous catheter will be placed during the course of the procedure. EXAM: SELECTIVE VISCERAL ARTERIOGRAPHY; IR EMBO ART VEN HEMORR LYMPH EXTRAV INC GUIDE ROADMAPPING; IR LEFT FLOURO GUIDE CV LINE; ADDITIONAL ARTERIOGRAPHY; IR ULTRASOUND GUIDANCE VASC ACCESS LEFT; ARTERIOGRAPHY Date: 01/23/2015 PROCEDURE: 1. Ultrasound-guided puncture left common femoral artery 2. Catheterization of the superior mesenteric artery with arteriogram 3. Catheterization of the celiac artery with arteriogram 4. Catheterization of the gastroduodenal artery with arteriogram 5. Catheterization of the right gastroepiploic artery with arteriogram 6. Coil embolization of the gastroduodenal artery 7. Post embolization arteriogram 8. Limited left common femoral arteriogram 9. Ultrasound-guided puncture of the left common femoral vein 10. Placement of a triple-lumen central venous catheter via the left femoral vein with fluoroscopic guidance Interventional Radiologist:  Criselda Peaches, MD ANESTHESIA/SEDATION: Moderate (conscious) sedation was used. 1 mg Versed, 50 mcg Fentanyl were administered intravenously. The patient's vital signs were monitored continuously by radiology nursing throughout the procedure. Sedation Time: 60 minutes MEDICATIONS: None additional FLUOROSCOPY TIME:  16 minutes for a total of 622 mGy CONTRAST:  152mL OMNIPAQUE IOHEXOL 300 MG/ML  SOLN TECHNIQUE: Informed consent was obtained from the patient following explanation of the procedure, risks, benefits and alternatives. The patient understands, agrees and consents for the procedure. All questions were addressed. A time out was performed. Maximal barrier sterile technique utilized including caps, mask, sterile gowns, sterile gloves, large sterile drape, hand hygiene, and Betadine skin prep. The left groin was interrogated with ultrasound. The common femoral artery is found to be widely patent. An  image was obtained and stored for the medical record. Local anesthesia was attained by infiltration with 1% lidocaine. Under real-time sonographic guidance, the vessel was punctured with a 21 gauge micropuncture needle. Using standard technique, the initial micro wire was exchanged through a transitional 5 [place] micro sheath for a working 0.035 inch Bentson wire. The micro sheath was then exchanged for a working 5 [place] vascular sheath. A C2 cobra catheter was advanced into the abdominal aorta over the wire. The catheter was initially advanced into the superior mesenteric artery and superior mesenteric arteriogram was performed. There is conventional anatomy. No replaced or accessory right hepatic artery. No evidence of active extravasation or significant  collateralization through the pancreaticoduodenal arcade. The C2 cobra catheter was next advanced into the celiac artery. An arteriogram was performed. The gastroduodenal artery is elongated and slightly irregular in the mid segment likely secondary to external mass effect. There is no evidence of active extravasation. A renegade ST micro catheter was advanced coaxially over a fat and 16 wire. The micro catheter was advanced into the gastroduodenal artery. Arteriography was again performed. There are multiple branches arising from the gastroduodenal artery providing supply to a hypervascular mass in the region the descending duodenum. The micro catheter was carefully advanced more distally into the proximal right gastroepiploic artery. Contrast injection was performed confirming the location of the catheter tip as well as confirming no additional supplied to the region of tumor. Coil embolization was then performed of the gastroduodenal artery using a combination of vortex, standard helical and soft interlock detachable micro coils ranging in size from 2-6 mm. Post embolization arteriography confirms successful embolization of the vessel with no further visible  hypervascular blush of the duodenum tumor. The micro catheter was removed. Repeat celiac artery injection was performed confirming no additional collateral supply. The 5 French catheter was readvanced of the superior mesenteric artery and an additional angiogram was performed again confirming no collateral flow to the embolized territory. The catheters were removed. The vascular sheath was left in place for hemodynamic monitoring. Ultrasound was again used to interrogate the left groin. The left common femoral vein was identified. Local anesthesia was attained by infiltration with 1% lidocaine. The left common femoral vein was punctured with an 18 gauge needle. A wire was advanced into the inferior vena cava. The skin tract was dilated and an arrow triple-lumen central venous catheter advanced over the wire and position with the tip at the confluence of the IVC and left common iliac vein. The catheter was flushed, capped and secured with 0 Prolene suture. Sterile bandages were applied. COMPLICATIONS: None IMPRESSION: 1. No evidence of active extravasation. 2. 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 3. Coil embolization of the gastroduodenal artery. 4. Placement of a left femoral triple-lumen central venous catheter. The catheter tip is at the confluence of the IVC and iliac vein and ready for immediate use. Signed, Criselda Peaches, MD [facility] Radiology Electronically Signed   By: Jacqulynn Cadet M.D.   On: 01/23/2015 15:50   US Abdomen Limited Ruq  01/19/2015  CLINICAL DATA:  Transaminitis. History of duodenal ulcer with adherent clot. EXAM: US ABDOMEN LIMITED - RIGHT UPPER QUADRANT COMPARISON:  None. FINDINGS: Gallbladder: No gallstones or wall thickening visualized. Minimal dependent sludge. No sonographic Murphy sign noted. Small amounts of pericholecystic free  fluid. Common bile duct: Diameter: 4.2 mm. Liver: Ill-defined heterogeneous echogenicity in the region of the pancreatic head and porta hepatis, borders not well-defined. Mildly heterogeneous hepatic parenchyma. No intrahepatic biliary ductal dilatation. Normal directional flow in the main portal vein. Right pleural effusion, incidentally noted. IMPRESSION: 1. Heterogeneous echogenicity in the region of the porta hepatis and pancreatic head, difficult to separate from the adjacent liver parenchyma. This may reflect hematoma/blood products in the setting of bleeding duodenal ulcer versus pancreatic head or focal liver lesion. There is no biliary ductal dilatation. Recommend further characterization with cross-sectional imaging, contrast-enhanced CT or MRI. 2. Minimal sludge in the gallbladder, no wall thickening or gallstones. Small amount of pericholecystic fluid. No sonographic Murphy sign or findings of acute cholecystitis. Electronically Signed  By: Jeb Levering M.D.   On: 01/19/2015 20:48    Labs:  CBC:  Recent Labs  01/23/15 2230 01/24/15 0430 01/25/15 0355 01/25/15 0830  WBC 17.0* 14.5* 10.5 11.1*  HGB 8.0* 7.5* 7.1* 7.1*  HCT 23.7* 22.1* 20.9* 21.8*  PLT 216 207 200 231    COAGS:  Recent Labs  01/17/15 1928 01/17/15 2119 01/18/15 0941 01/23/15 0751 01/25/15 0355  INR 1.20  --  1.14 1.53* 1.24  APTT  --  31  --   --   --     BMP:  Recent Labs  01/22/15 0400 01/23/15 0751 01/23/15 0812 01/24/15 0430 01/25/15 0355  NA 138 147* 142 141 141  K 3.8 5.7* 5.5* 4.3 4.1  CL 106 111 113* 114* 113*  CO2 25 8*  --  21* 23  GLUCOSE 98 359* 328* 142* 112*  BUN <5* 11 13 21* 19  CALCIUM 8.1* 9.2  --  6.7* 7.4*  CREATININE 0.94 1.71* 1.50* 1.65* 1.21  GFRNONAA >60 41*  --  43* >60  GFRAA >60 48*  --  50* >60    LIVER FUNCTION TESTS:  Recent Labs  01/19/15 0235 01/21/15 0422 01/23/15 0751 01/24/15 0430  BILITOT 0.8 0.5 0.2* 0.5  AST 35 23 44* 39  ALT 73* 50 37  37  ALKPHOS 62 64 67 61  PROT 5.1* 5.3* 4.5* 4.1*  ALBUMIN 2.4* 2.5* 2.3* 2.0*    TUMOR MARKERS:  Recent Labs  01/22/15 0400  AFPTM 7.3  CEA 0.7    Assessment and Plan:  Mantle Cell lymphoma  Will proceed with placement of Port A Cath by Dr. Earleen Newport.  Risks and Benefits discussed with the patient including, but not limited to bleeding, infection, pneumothorax, or fibrin sheath development and need for additional procedures.  All of the patient's questions were answered, patient is agreeable to proceed. Consent signed and in chart.   Signed: Murrell Redden PA-C 01/25/2015, 3:44 PM   I spent a total of 20 Minutes   in face to face in clinical consultation, greater than 50% of which was counseling/coordinating care for port a cath

## 2015-01-25 NOTE — Progress Notes (Signed)
  Echocardiogram 2D Echocardiogram has been performed.  Bobbye Charleston 01/25/2015, 2:24 PM

## 2015-01-25 NOTE — Progress Notes (Signed)
[name]   DOB:[birth year]   KG#:[id number]   FWY#:[id number]  Subjective: he feels well, agitating to go home for Christmas; no family in room   Objective:  Filed Vitals:   01/24/15 2316 01/25/15 0329  BP: 135/89 145/88  Pulse: 88 81  Temp: 98 F (36.7 C) 98 F (36.7 C)  Resp: 21 19    Body mass index is 30.66 kg/(m^2).  Intake/Output Summary (Last 24 hours) at 01/25/15 0759 Last data filed at 01/25/15 0400  Gross per 24 hour  Intake 6560.87 ml  Output   2150 ml  Net 4410.87 ml   Lungs: no rales or wheezes, auscultated anterolaterally RRR +BS  . CBG (last 3)   Recent Labs  01/24/15 1539 01/24/15 2317 01/25/15 0331  GLUCAP 113* 110* 109*     Labs:  Lab Results  Component Value Date   WBC 10.5 01/25/2015   HGB 7.1* 01/25/2015   HCT 20.9* 01/25/2015   MCV 85.7 01/25/2015   PLT 200 01/25/2015   NEUTROABS 8.9* 01/25/2015    _0 @  Urine Studies No results for input(s): UHGB, CRYS in the last 72 hours.  Invalid input(s): UACOL, UAPR, USPG, UPH, UTP, UGL, UKET, UBIL, UNIT, UROB, [place], UEPI, UWBC, [place], [place], [place], [place], [place]  Basic Metabolic Panel:  Recent Labs Lab 01/21/15 0422 01/22/15 0400 01/23/15 0751 01/23/15 0812 01/24/15 0430 01/25/15 0355  NA 138 138 147* 142 141 141  K 3.7 3.8 5.7* 5.5* 4.3 4.1  CL 108 106 111 113* 114* 113*  CO2 24 25 8*  --  21* 23  GLUCOSE 108* 98 359* 328* 142* 112*  BUN <5* <5* 11 13 21* 19  CREATININE 1.03 0.94 1.71* 1.50* 1.65* 1.21  CALCIUM 7.9* 8.1* 9.2  --  6.7* 7.4*  MG  --   --   --   --  1.8 2.1  PHOS  --   --   --   --  4.6 3.0   GFR Estimated Creatinine Clearance: 74 mL/min (by C-G formula based on Cr of 1.21). Liver Function Tests:  Recent Labs Lab 01/18/15 0941 01/19/15 0235 01/21/15 0422 01/23/15 0751 01/24/15 0430  AST 42* 35 23 44* 39  ALT 84* 73* 50 37 37  ALKPHOS 73 62 64 67 61  BILITOT 1.4* 0.8 0.5 0.2* 0.5  PROT 5.2* 5.1* 5.3* 4.5* 4.1*  ALBUMIN 2.6* 2.4* 2.5* 2.3*  2.0*   No results for input(s): LIPASE, AMYLASE in the last 168 hours.  Recent Labs Lab 01/23/15 0800  AMMONIA 336*   Coagulation profile  Recent Labs Lab 01/18/15 0941 01/23/15 0751 01/25/15 0355  INR 1.14 1.53* 1.24    CBC:  Recent Labs Lab 01/23/15 1400 01/23/15 1710 01/23/15 2230 01/24/15 0430 01/25/15 0355  WBC 16.6* 19.2* 17.0* 14.5* 10.5  NEUTROABS  --   --   --   --  8.9*  HGB 8.5* 8.6* 8.0* 7.5* 7.1*  HCT 25.3* 25.5* 23.7* 22.1* 20.9*  MCV 85.5 85.0 84.0 84.0 85.7  PLT 215 224 216 207 200   Cardiac Enzymes: No results for input(s): CKTOTAL, CKMB, CKMBINDEX, TROPONINI in the last 168 hours. BNP: Invalid input(s): POCBNP CBG:  Recent Labs Lab 01/24/15 0733 01/24/15 1137 01/24/15 1539 01/24/15 2317 01/25/15 0331  GLUCAP 119* 116* 113* 110* 109*   D-Dimer No results for input(s): DDIMER in the last 72 hours. Hgb A1c No results for input(s): HGBA1C in the last 72 hours. Lipid Profile  Recent Labs  01/23/15 1400  TRIG  107   Thyroid function studies No results for input(s): TSH, T4TOTAL, T3FREE, THYROIDAB in the last 72 hours.  Invalid input(s): FREET3 Anemia work up No results for input(s): VITAMINB12, FOLATE, FERRITIN, TIBC, IRON, RETICCTPCT in the last 72 hours. Microbiology Recent Results (from the past 240 hour(s))  MRSA PCR Screening     Status: None   Collection Time: 01/17/15  9:07 PM  Result Value Ref Range Status   MRSA by PCR NEGATIVE NEGATIVE Final    Comment:        The GeneXpert MRSA Assay (FDA approved for NASAL specimens only), is one component of a comprehensive MRSA colonization surveillance program. It is not intended to diagnose MRSA infection nor to guide or monitor treatment for MRSA infections.   Surgical pcr screen     Status: Abnormal   Collection Time: 01/22/15  3:10 AM  Result Value Ref Range Status   MRSA, PCR NEGATIVE NEGATIVE Final   Staphylococcus aureus POSITIVE (A) NEGATIVE Final    Comment:         The Xpert SA Assay (FDA approved for NASAL specimens in patients over 55 years of age), is one component of a comprehensive surveillance program.  Test performance has been validated by [facility] for patients greater than or equal to 5 year old. It is not intended to diagnose infection nor to guide or monitor treatment.   Culture, blood (routine x 2)     Status: None (Preliminary result)   Collection Time: 01/23/15  8:00 AM  Result Value Ref Range Status   Specimen Description BLOOD RIGHT ANTECUBITAL  Final   Special Requests BOTTLES DRAWN AEROBIC AND ANAEROBIC 5MLS  Final   Culture NO GROWTH 1 DAY  Final   Report Status PENDING  Incomplete  Urine culture     Status: None   Collection Time: 01/23/15  8:05 AM  Result Value Ref Range Status   Specimen Description URINE, CATHETERIZED  Final   Special Requests Normal  Final   Culture NO GROWTH 1 DAY  Final   Report Status 01/24/2015 FINAL  Final  Culture, blood (routine x 2)     Status: None (Preliminary result)   Collection Time: 01/23/15  4:33 PM  Result Value Ref Range Status   Specimen Description BLOOD RIGHT HAND  Final   Special Requests BOTTLES DRAWN AEROBIC ONLY 4CC  Final   Culture NO GROWTH < 24 HOURS  Final   Report Status PENDING  Incomplete      Studies:  Ct Head Wo Contrast  01/23/2015  CLINICAL DATA:  64 year old male with fall. EXAM: CT HEAD WITHOUT CONTRAST CT CERVICAL SPINE WITHOUT CONTRAST TECHNIQUE: Multidetector CT imaging of the head and cervical spine was performed following the standard protocol without intravenous contrast. Multiplanar CT image reconstructions of the cervical spine were also generated. COMPARISON:  None. FINDINGS: CT HEAD FINDINGS The ventricles and sulci are appropriate in size for patient's age. Mild periventricular and deep white matter hypodensities represent chronic microvascular ischemic changes. There is no intracranial hemorrhage. No mass effect or midline shift identified.  An endotracheal and enteric tube are partially visualized. There is diffuse mucoperiosteal thickening of the paranasal sinuses with partial opacification of the ethmoid air cells. The mastoid air cells are well aerated. Right occipital scalp hematoma. The calvarium is intact. CT CERVICAL SPINE FINDINGS There is no acute fracture or subluxation of the cervical spine.Mild multilevel degenerative changes.The odontoid and spinous processes are intact.There is normal anatomic alignment of the C1-C2 lateral masses.  The visualized soft tissues appear unremarkable. An enteric tube and an endotracheal tube are partially visualized. Partially visualized bilateral pleural effusions, right greater than left. There is bilateral supraclavicular and right cervical adenopathy. Upper mediastinal lymphadenopathy noted. Chest CT is recommended for further evaluation. Right supraclavicular soft tissue hemorrhage noted. IMPRESSION: No acute intracranial hemorrhage. 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 No acute/traumatic cervical spine pathology. Bilateral supraclavicular and right cervical as well as mediastinal adenopathy. Further evaluation recommended. Electronically Signed   By: Anner Crete M.D.   On: 01/23/2015 18:27   Ct Cervical Spine Wo Contrast  01/23/2015  CLINICAL DATA:  64 year old male with fall. EXAM: CT HEAD WITHOUT CONTRAST CT CERVICAL SPINE WITHOUT CONTRAST TECHNIQUE: Multidetector CT imaging of the head and cervical spine was performed following the standard protocol without intravenous contrast. Multiplanar CT image reconstructions of the cervical spine were also generated. COMPARISON:  None. FINDINGS: CT HEAD FINDINGS The ventricles and sulci are appropriate in size for patient's age. Mild periventricular and deep white matter hypodensities represent chronic microvascular ischemic changes. There is no intracranial hemorrhage. No mass effect or midline shift identified. An  endotracheal and enteric tube are partially visualized. There is diffuse mucoperiosteal thickening of the paranasal sinuses with partial opacification of the ethmoid air cells. The mastoid air cells are well aerated. Right occipital scalp hematoma. The calvarium is intact. CT CERVICAL SPINE FINDINGS There is no acute fracture or subluxation of the cervical spine.Mild multilevel degenerative changes.The odontoid and spinous processes are intact.There is normal anatomic alignment of the C1-C2 lateral masses. The visualized soft tissues appear unremarkable. An enteric tube and an endotracheal tube are partially visualized. Partially visualized bilateral pleural effusions, right greater than left. There is bilateral supraclavicular and right cervical adenopathy. Upper mediastinal lymphadenopathy noted. Chest CT is recommended for further evaluation. Right supraclavicular soft tissue hemorrhage noted. IMPRESSION: No acute intracranial hemorrhage. 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 No acute/traumatic cervical spine pathology. Bilateral supraclavicular and right cervical as well as mediastinal adenopathy. Further evaluation recommended. Electronically Signed   By: Anner Crete M.D.   On: 01/23/2015 18:27   Ir Angiogram Visceral Selective  01/23/2015  CLINICAL DATA:  64 year old male presents with acute life-threatening upper GI bleed secondary to an ulcerated mass encompassing the distal stomach and duodenum. 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 Endoscopic treatment options are limited and surgery is extremely high risk. Patient presents for emergent visceral arteriography and embolization. Additionally, central venous access is required. A triple-lumen central venous catheter will be placed during the course of the procedure. EXAM: SELECTIVE VISCERAL ARTERIOGRAPHY; IR EMBO ART VEN HEMORR  LYMPH EXTRAV INC GUIDE ROADMAPPING; IR LEFT FLOURO GUIDE CV LINE; ADDITIONAL ARTERIOGRAPHY; IR ULTRASOUND GUIDANCE VASC ACCESS LEFT; ARTERIOGRAPHY Date: 01/23/2015 PROCEDURE: 1. Ultrasound-guided puncture left common femoral artery 2. Catheterization of the superior mesenteric artery with arteriogram 3. Catheterization of the celiac artery with arteriogram 4. Catheterization of the gastroduodenal artery with arteriogram 5. Catheterization of the right gastroepiploic artery with arteriogram 6. Coil embolization of the gastroduodenal artery 7. Post embolization arteriogram 8. Limited left common femoral arteriogram 9. Ultrasound-guided puncture of the left common femoral vein 10. Placement of a triple-lumen central venous catheter via the left femoral vein with fluoroscopic guidance Interventional Radiologist:  Criselda Peaches, MD ANESTHESIA/SEDATION: Moderate (conscious) sedation was used. 1 mg Versed, 50 mcg Fentanyl were administered intravenously. The patient's vital signs were monitored continuously by radiology nursing throughout the  procedure. Sedation Time: 60 minutes MEDICATIONS: None additional FLUOROSCOPY TIME:  16 minutes for a total of 622 mGy CONTRAST:  132m OMNIPAQUE IOHEXOL 300 MG/ML  SOLN TECHNIQUE: Informed consent was obtained from the patient following explanation of the procedure, risks, benefits and alternatives. The patient understands, agrees and consents for the procedure. All questions were addressed. A time out was performed. Maximal barrier sterile technique utilized including caps, mask, sterile gowns, sterile gloves, large sterile drape, hand hygiene, and Betadine skin prep. The left groin was interrogated with ultrasound. The common femoral artery is found to be widely patent. An image was obtained and stored for the medical record. Local anesthesia was attained by infiltration with 1% lidocaine. Under real-time sonographic guidance, the vessel was punctured with a 21 gauge  micropuncture needle. Using standard technique, the initial micro wire was exchanged through a transitional 5 [place] sheath for a working 0.035 inch Bentson wire. The micro sheath was then exchanged for a working 5 [place] sheath. A C2 cobra catheter was advanced into the abdominal aorta over the wire. The catheter was initially advanced into the superior mesenteric artery and superior mesenteric arteriogram was performed. There is conventional anatomy. No replaced or accessory right hepatic artery. No evidence of active extravasation or significant collateralization through the pancreaticoduodenal arcade. The C2 cobra catheter was next advanced into the celiac artery. An arteriogram was performed. The gastroduodenal artery is elongated and slightly irregular in the mid segment likely secondary to external mass effect. There is no evidence of active extravasation. A renegade ST micro catheter was advanced coaxially over a fat and 16 wire. The micro catheter was advanced into the gastroduodenal artery. Arteriography was again performed. There are multiple branches arising from the gastroduodenal artery providing supply to a hypervascular mass in the region the descending duodenum. The micro catheter was carefully advanced more distally into the proximal right gastroepiploic artery. Contrast injection was performed confirming the location of the catheter tip as well as confirming no additional supplied to the region of tumor. Coil embolization was then performed of the gastroduodenal artery using a combination of vortex, standard helical and soft interlock detachable micro coils ranging in size from 2-6 mm. Post embolization arteriography confirms successful embolization of the vessel with no further visible hypervascular blush of the duodenum tumor. The micro catheter was removed. Repeat celiac artery injection was performed confirming no additional collateral supply. The 5 French catheter was  readvanced of the superior mesenteric artery and an additional angiogram was performed again confirming no collateral flow to the embolized territory. The catheters were removed. The vascular sheath was left in place for hemodynamic monitoring. Ultrasound was again used to interrogate the left groin. The left common femoral vein was identified. Local anesthesia was attained by infiltration with 1% lidocaine. The left common femoral vein was punctured with an 18 gauge needle. A wire was advanced into the inferior vena cava. The skin tract was dilated and an arrow triple-lumen central venous catheter advanced over the wire and position with the tip at the confluence of the IVC and left common iliac vein. The catheter was flushed, capped and secured with 0 Prolene suture. Sterile bandages were applied. COMPLICATIONS: None IMPRESSION: 1. No evidence of active extravasation. 2. 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 3. Coil embolization of the gastroduodenal artery. 4. Placement of a left femoral triple-lumen central venous catheter. The catheter tip is at the confluence of the IVC  and iliac vein and ready for immediate use. Signed, Criselda Peaches, MD [facility] Radiology Electronically Signed   By: Jacqulynn Cadet M.D.   On: 01/23/2015 15:50   Ir Angiogram Visceral Selective  01/23/2015  CLINICAL DATA:  64 year old male presents with acute life-threatening upper GI bleed secondary to an ulcerated mass encompassing the distal stomach and duodenum. 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 Endoscopic treatment options are limited and surgery is extremely high risk. Patient presents for emergent visceral arteriography and embolization. Additionally, central venous access is required. A  triple-lumen central venous catheter will be placed during the course of the procedure. EXAM: SELECTIVE VISCERAL ARTERIOGRAPHY; IR EMBO ART VEN HEMORR LYMPH EXTRAV INC GUIDE ROADMAPPING; IR LEFT FLOURO GUIDE CV LINE; ADDITIONAL ARTERIOGRAPHY; IR ULTRASOUND GUIDANCE VASC ACCESS LEFT; ARTERIOGRAPHY Date: 01/23/2015 PROCEDURE: 1. Ultrasound-guided puncture left common femoral artery 2. Catheterization of the superior mesenteric artery with arteriogram 3. Catheterization of the celiac artery with arteriogram 4. Catheterization of the gastroduodenal artery with arteriogram 5. Catheterization of the right gastroepiploic artery with arteriogram 6. Coil embolization of the gastroduodenal artery 7. Post embolization arteriogram 8. Limited left common femoral arteriogram 9. Ultrasound-guided puncture of the left common femoral vein 10. Placement of a triple-lumen central venous catheter via the left femoral vein with fluoroscopic guidance Interventional Radiologist:  Criselda Peaches, MD ANESTHESIA/SEDATION: Moderate (conscious) sedation was used. 1 mg Versed, 50 mcg Fentanyl were administered intravenously. The patient's vital signs were monitored continuously by radiology nursing throughout the procedure. Sedation Time: 60 minutes MEDICATIONS: None additional FLUOROSCOPY TIME:  16 minutes for a total of 622 mGy CONTRAST:  141m OMNIPAQUE IOHEXOL 300 MG/ML  SOLN TECHNIQUE: Informed consent was obtained from the patient following explanation of the procedure, risks, benefits and alternatives. The patient understands, agrees and consents for the procedure. All questions were addressed. A time out was performed. Maximal barrier sterile technique utilized including caps, mask, sterile gowns, sterile gloves, large sterile drape, hand hygiene, and Betadine skin prep. The left groin was interrogated with ultrasound. The common femoral artery is found to be widely patent. An image was obtained and stored for the medical record.  Local anesthesia was attained by infiltration with 1% lidocaine. Under real-time sonographic guidance, the vessel was punctured with a 21 gauge micropuncture needle. Using standard technique, the initial micro wire was exchanged through a transitional 5 [place] sheath for a working 0.035 inch Bentson wire. The micro sheath was then exchanged for a working 5 [place] sheath. A C2 cobra catheter was advanced into the abdominal aorta over the wire. The catheter was initially advanced into the superior mesenteric artery and superior mesenteric arteriogram was performed. There is conventional anatomy. No replaced or accessory right hepatic artery. No evidence of active extravasation or significant collateralization through the pancreaticoduodenal arcade. The C2 cobra catheter was next advanced into the celiac artery. An arteriogram was performed. The gastroduodenal artery is elongated and slightly irregular in the mid segment likely secondary to external mass effect. There is no evidence of active extravasation. A renegade ST micro catheter was advanced coaxially over a fat and 16 wire. The micro catheter was advanced into the gastroduodenal artery. Arteriography was again performed. There are multiple branches arising from the gastroduodenal artery providing supply to a hypervascular mass in the region the descending duodenum. The micro catheter was carefully advanced more distally into the proximal right gastroepiploic artery. Contrast injection was performed  confirming the location of the catheter tip as well as confirming no additional supplied to the region of tumor. Coil embolization was then performed of the gastroduodenal artery using a combination of vortex, standard helical and soft interlock detachable micro coils ranging in size from 2-6 mm. Post embolization arteriography confirms successful embolization of the vessel with no further visible hypervascular blush of the duodenum tumor. The micro  catheter was removed. Repeat celiac artery injection was performed confirming no additional collateral supply. The 5 French catheter was readvanced of the superior mesenteric artery and an additional angiogram was performed again confirming no collateral flow to the embolized territory. The catheters were removed. The vascular sheath was left in place for hemodynamic monitoring. Ultrasound was again used to interrogate the left groin. The left common femoral vein was identified. Local anesthesia was attained by infiltration with 1% lidocaine. The left common femoral vein was punctured with an 18 gauge needle. A wire was advanced into the inferior vena cava. The skin tract was dilated and an arrow triple-lumen central venous catheter advanced over the wire and position with the tip at the confluence of the IVC and left common iliac vein. The catheter was flushed, capped and secured with 0 Prolene suture. Sterile bandages were applied. COMPLICATIONS: None IMPRESSION: 1. No evidence of active extravasation. 2. 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 3. Coil embolization of the gastroduodenal artery. 4. Placement of a left femoral triple-lumen central venous catheter. The catheter tip is at the confluence of the IVC and iliac vein and ready for immediate use. Signed, Criselda Peaches, MD [facility] Radiology Electronically Signed   By: Jacqulynn Cadet M.D.   On: 01/23/2015 15:50   Ir Angiogram Selective Each Additional Vessel  01/23/2015  CLINICAL DATA:  64 year old male presents with acute life-threatening upper GI bleed secondary to an ulcerated mass encompassing the distal stomach and duodenum. 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 Endoscopic treatment options are limited and surgery  is extremely high risk. Patient presents for emergent visceral arteriography and embolization. Additionally, central venous access is required. A triple-lumen central venous catheter will be placed during the course of the procedure. EXAM: SELECTIVE VISCERAL ARTERIOGRAPHY; IR EMBO ART VEN HEMORR LYMPH EXTRAV INC GUIDE ROADMAPPING; IR LEFT FLOURO GUIDE CV LINE; ADDITIONAL ARTERIOGRAPHY; IR ULTRASOUND GUIDANCE VASC ACCESS LEFT; ARTERIOGRAPHY Date: 01/23/2015 PROCEDURE: 1. Ultrasound-guided puncture left common femoral artery 2. Catheterization of the superior mesenteric artery with arteriogram 3. Catheterization of the celiac artery with arteriogram 4. Catheterization of the gastroduodenal artery with arteriogram 5. Catheterization of the right gastroepiploic artery with arteriogram 6. Coil embolization of the gastroduodenal artery 7. Post embolization arteriogram 8. Limited left common femoral arteriogram 9. Ultrasound-guided puncture of the left common femoral vein 10. Placement of a triple-lumen central venous catheter via the left femoral vein with fluoroscopic guidance Interventional Radiologist:  Criselda Peaches, MD ANESTHESIA/SEDATION: Moderate (conscious) sedation was used. 1 mg Versed, 50 mcg Fentanyl were administered intravenously. The patient's vital signs were monitored continuously by radiology nursing throughout the procedure. Sedation Time: 60 minutes MEDICATIONS: None additional FLUOROSCOPY TIME:  16 minutes for a total of 622 mGy CONTRAST:  128m OMNIPAQUE IOHEXOL 300 MG/ML  SOLN TECHNIQUE: Informed consent was obtained from the patient following explanation of the procedure, risks, benefits and alternatives. The patient understands, agrees and consents for the procedure. All questions  were addressed. A time out was performed. Maximal barrier sterile technique utilized including caps, mask, sterile gowns, sterile gloves, large sterile drape, hand hygiene, and Betadine skin prep. The left groin  was interrogated with ultrasound. The common femoral artery is found to be widely patent. An image was obtained and stored for the medical record. Local anesthesia was attained by infiltration with 1% lidocaine. Under real-time sonographic guidance, the vessel was punctured with a 21 gauge micropuncture needle. Using standard technique, the initial micro wire was exchanged through a transitional 5 [place] micro sheath for a working 0.035 inch Bentson wire. The micro sheath was then exchanged for a working 5 [place] vascular sheath. A C2 cobra catheter was advanced into the abdominal aorta over the wire. The catheter was initially advanced into the superior mesenteric artery and superior mesenteric arteriogram was performed. There is conventional anatomy. No replaced or accessory right hepatic artery. No evidence of active extravasation or significant collateralization through the pancreaticoduodenal arcade. The C2 cobra catheter was next advanced into the celiac artery. An arteriogram was performed. The gastroduodenal artery is elongated and slightly irregular in the mid segment likely secondary to external mass effect. There is no evidence of active extravasation. A renegade ST micro catheter was advanced coaxially over a fat and 16 wire. The micro catheter was advanced into the gastroduodenal artery. Arteriography was again performed. There are multiple branches arising from the gastroduodenal artery providing supply to a hypervascular mass in the region the descending duodenum. The micro catheter was carefully advanced more distally into the proximal right gastroepiploic artery. Contrast injection was performed confirming the location of the catheter tip as well as confirming no additional supplied to the region of tumor. Coil embolization was then performed of the gastroduodenal artery using a combination of vortex, standard helical and soft interlock detachable micro coils ranging in size from 2-6 mm. Post  embolization arteriography confirms successful embolization of the vessel with no further visible hypervascular blush of the duodenum tumor. The micro catheter was removed. Repeat celiac artery injection was performed confirming no additional collateral supply. The 5 French catheter was readvanced of the superior mesenteric artery and an additional angiogram was performed again confirming no collateral flow to the embolized territory. The catheters were removed. The vascular sheath was left in place for hemodynamic monitoring. Ultrasound was again used to interrogate the left groin. The left common femoral vein was identified. Local anesthesia was attained by infiltration with 1% lidocaine. The left common femoral vein was punctured with an 18 gauge needle. A wire was advanced into the inferior vena cava. The skin tract was dilated and an arrow triple-lumen central venous catheter advanced over the wire and position with the tip at the confluence of the IVC and left common iliac vein. The catheter was flushed, capped and secured with 0 Prolene suture. Sterile bandages were applied. COMPLICATIONS: None IMPRESSION: 1. No evidence of active extravasation. 2. 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 3. Coil embolization of the gastroduodenal artery. 4. Placement of a left femoral triple-lumen central venous catheter. The catheter tip is at the confluence of the IVC and iliac vein and ready for immediate use. Signed, Criselda Peaches, MD [facility] Radiology Electronically Signed   By: Jacqulynn Cadet M.D.   On: 01/23/2015 15:50   Ir Angiogram Follow Up Study  01/23/2015  CLINICAL DATA:  64 year old male presents with acute life-threatening upper GI bleed  secondary to an ulcerated mass encompassing the distal stomach and duodenum. 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 Endoscopic treatment options are limited and surgery is extremely high risk. Patient presents for emergent visceral arteriography and embolization. Additionally, central venous access is required. A triple-lumen central venous catheter will be placed during the course of the procedure. EXAM: SELECTIVE VISCERAL ARTERIOGRAPHY; IR EMBO ART VEN HEMORR LYMPH EXTRAV INC GUIDE ROADMAPPING; IR LEFT FLOURO GUIDE CV LINE; ADDITIONAL ARTERIOGRAPHY; IR ULTRASOUND GUIDANCE VASC ACCESS LEFT; ARTERIOGRAPHY Date: 01/23/2015 PROCEDURE: 1. Ultrasound-guided puncture left common femoral artery 2. Catheterization of the superior mesenteric artery with arteriogram 3. Catheterization of the celiac artery with arteriogram 4. Catheterization of the gastroduodenal artery with arteriogram 5. Catheterization of the right gastroepiploic artery with arteriogram 6. Coil embolization of the gastroduodenal artery 7. Post embolization arteriogram 8. Limited left common femoral arteriogram 9. Ultrasound-guided puncture of the left common femoral vein 10. Placement of a triple-lumen central venous catheter via the left femoral vein with fluoroscopic guidance Interventional Radiologist:  Criselda Peaches, MD ANESTHESIA/SEDATION: Moderate (conscious) sedation was used. 1 mg Versed, 50 mcg Fentanyl were administered intravenously. The patient's vital signs were monitored continuously by radiology nursing throughout the procedure. Sedation Time: 60 minutes MEDICATIONS: None additional FLUOROSCOPY TIME:  16 minutes for a total of 622 mGy CONTRAST:  12m OMNIPAQUE IOHEXOL 300 MG/ML  SOLN TECHNIQUE: Informed consent was obtained from the patient following explanation of the procedure, risks, benefits and alternatives. The patient understands, agrees and consents for the procedure. All questions were addressed. A time out was performed. Maximal barrier sterile technique  utilized including caps, mask, sterile gowns, sterile gloves, large sterile drape, hand hygiene, and Betadine skin prep. The left groin was interrogated with ultrasound. The common femoral artery is found to be widely patent. An image was obtained and stored for the medical record. Local anesthesia was attained by infiltration with 1% lidocaine. Under real-time sonographic guidance, the vessel was punctured with a 21 gauge micropuncture needle. Using standard technique, the initial micro wire was exchanged through a transitional 5 [place] sheath for a working 0.035 inch Bentson wire. The micro sheath was then exchanged for a working 5 [place] sheath. A C2 cobra catheter was advanced into the abdominal aorta over the wire. The catheter was initially advanced into the superior mesenteric artery and superior mesenteric arteriogram was performed. There is conventional anatomy. No replaced or accessory right hepatic artery. No evidence of active extravasation or significant collateralization through the pancreaticoduodenal arcade. The C2 cobra catheter was next advanced into the celiac artery. An arteriogram was performed. The gastroduodenal artery is elongated and slightly irregular in the mid segment likely secondary to external mass effect. There is no evidence of active extravasation. A renegade ST micro catheter was advanced coaxially over a fat and 16 wire. The micro catheter was advanced into the gastroduodenal artery. Arteriography was again performed. There are multiple branches arising from the gastroduodenal artery providing supply to a hypervascular mass in the region the descending duodenum. The micro catheter was carefully advanced more distally into the proximal right gastroepiploic artery. Contrast injection was performed confirming the location of the catheter tip as well as confirming no additional supplied to the region of tumor. Coil embolization was then performed of the gastroduodenal  artery using a combination of vortex, standard helical and soft interlock detachable micro coils ranging in size from 2-6 mm. Post embolization arteriography confirms successful embolization of  the vessel with no further visible hypervascular blush of the duodenum tumor. The micro catheter was removed. Repeat celiac artery injection was performed confirming no additional collateral supply. The 5 French catheter was readvanced of the superior mesenteric artery and an additional angiogram was performed again confirming no collateral flow to the embolized territory. The catheters were removed. The vascular sheath was left in place for hemodynamic monitoring. Ultrasound was again used to interrogate the left groin. The left common femoral vein was identified. Local anesthesia was attained by infiltration with 1% lidocaine. The left common femoral vein was punctured with an 18 gauge needle. A wire was advanced into the inferior vena cava. The skin tract was dilated and an arrow triple-lumen central venous catheter advanced over the wire and position with the tip at the confluence of the IVC and left common iliac vein. The catheter was flushed, capped and secured with 0 Prolene suture. Sterile bandages were applied. COMPLICATIONS: None IMPRESSION: 1. No evidence of active extravasation. 2. 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 3. Coil embolization of the gastroduodenal artery. 4. Placement of a left femoral triple-lumen central venous catheter. The catheter tip is at the confluence of the IVC and iliac vein and ready for immediate use. Signed, Criselda Peaches, MD [facility] Radiology Electronically Signed   By: Jacqulynn Cadet M.D.   On: 01/23/2015 15:50   Ir Fluoro Guide Cv Line Left  01/23/2015  CLINICAL DATA:  64 year old male presents with acute life-threatening upper  GI bleed secondary to an ulcerated mass encompassing the distal stomach and duodenum. 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 Endoscopic treatment options are limited and surgery is extremely high risk. Patient presents for emergent visceral arteriography and embolization. Additionally, central venous access is required. A triple-lumen central venous catheter will be placed during the course of the procedure. EXAM: SELECTIVE VISCERAL ARTERIOGRAPHY; IR EMBO ART VEN HEMORR LYMPH EXTRAV INC GUIDE ROADMAPPING; IR LEFT FLOURO GUIDE CV LINE; ADDITIONAL ARTERIOGRAPHY; IR ULTRASOUND GUIDANCE VASC ACCESS LEFT; ARTERIOGRAPHY Date: 01/23/2015 PROCEDURE: 1. Ultrasound-guided puncture left common femoral artery 2. Catheterization of the superior mesenteric artery with arteriogram 3. Catheterization of the celiac artery with arteriogram 4. Catheterization of the gastroduodenal artery with arteriogram 5. Catheterization of the right gastroepiploic artery with arteriogram 6. Coil embolization of the gastroduodenal artery 7. Post embolization arteriogram 8. Limited left common femoral arteriogram 9. Ultrasound-guided puncture of the left common femoral vein 10. Placement of a triple-lumen central venous catheter via the left femoral vein with fluoroscopic guidance Interventional Radiologist:  Criselda Peaches, MD ANESTHESIA/SEDATION: Moderate (conscious) sedation was used. 1 mg Versed, 50 mcg Fentanyl were administered intravenously. The patient's vital signs were monitored continuously by radiology nursing throughout the procedure. Sedation Time: 60 minutes MEDICATIONS: None additional FLUOROSCOPY TIME:  16 minutes for a total of 622 mGy CONTRAST:  119m OMNIPAQUE IOHEXOL 300 MG/ML  SOLN TECHNIQUE: Informed consent was obtained from the patient following explanation of the procedure, risks, benefits and alternatives. The patient understands,  agrees and consents for the procedure. All questions were addressed. A time out was performed. Maximal barrier sterile technique utilized including caps, mask, sterile gowns, sterile gloves, large sterile drape, hand hygiene, and Betadine skin prep. The left groin was interrogated with ultrasound. The common femoral artery is found to be widely patent. An image was obtained and stored for the medical  record. Local anesthesia was attained by infiltration with 1% lidocaine. Under real-time sonographic guidance, the vessel was punctured with a 21 gauge micropuncture needle. Using standard technique, the initial micro wire was exchanged through a transitional 5 [place] micro sheath for a working 0.035 inch Bentson wire. The micro sheath was then exchanged for a working 5 [place] vascular sheath. A C2 cobra catheter was advanced into the abdominal aorta over the wire. The catheter was initially advanced into the superior mesenteric artery and superior mesenteric arteriogram was performed. There is conventional anatomy. No replaced or accessory right hepatic artery. No evidence of active extravasation or significant collateralization through the pancreaticoduodenal arcade. The C2 cobra catheter was next advanced into the celiac artery. An arteriogram was performed. The gastroduodenal artery is elongated and slightly irregular in the mid segment likely secondary to external mass effect. There is no evidence of active extravasation. A renegade ST micro catheter was advanced coaxially over a fat and 16 wire. The micro catheter was advanced into the gastroduodenal artery. Arteriography was again performed. There are multiple branches arising from the gastroduodenal artery providing supply to a hypervascular mass in the region the descending duodenum. The micro catheter was carefully advanced more distally into the proximal right gastroepiploic artery. Contrast injection was performed confirming the location of the catheter tip  as well as confirming no additional supplied to the region of tumor. Coil embolization was then performed of the gastroduodenal artery using a combination of vortex, standard helical and soft interlock detachable micro coils ranging in size from 2-6 mm. Post embolization arteriography confirms successful embolization of the vessel with no further visible hypervascular blush of the duodenum tumor. The micro catheter was removed. Repeat celiac artery injection was performed confirming no additional collateral supply. The 5 French catheter was readvanced of the superior mesenteric artery and an additional angiogram was performed again confirming no collateral flow to the embolized territory. The catheters were removed. The vascular sheath was left in place for hemodynamic monitoring. Ultrasound was again used to interrogate the left groin. The left common femoral vein was identified. Local anesthesia was attained by infiltration with 1% lidocaine. The left common femoral vein was punctured with an 18 gauge needle. A wire was advanced into the inferior vena cava. The skin tract was dilated and an arrow triple-lumen central venous catheter advanced over the wire and position with the tip at the confluence of the IVC and left common iliac vein. The catheter was flushed, capped and secured with 0 Prolene suture. Sterile bandages were applied. COMPLICATIONS: None IMPRESSION: 1. No evidence of active extravasation. 2. 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 3. Coil embolization of the gastroduodenal artery. 4. Placement of a left femoral triple-lumen central venous catheter. The catheter tip is at the confluence of the IVC and iliac vein and ready for immediate use. Signed, Criselda Peaches, MD [facility] Radiology Electronically Signed   By: Jacqulynn Cadet M.D.   On: 01/23/2015  15:50   Ir US Guide Vasc Access Left  01/23/2015  CLINICAL DATA:  64 year old male presents with acute life-threatening upper GI bleed secondary to an ulcerated mass encompassing the distal stomach and duodenum. 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 Endoscopic treatment options are limited and surgery is extremely high risk. Patient presents for emergent visceral arteriography and embolization. Additionally, central venous access is required. A  triple-lumen central venous catheter will be placed during the course of the procedure. EXAM: SELECTIVE VISCERAL ARTERIOGRAPHY; IR EMBO ART VEN HEMORR LYMPH EXTRAV INC GUIDE ROADMAPPING; IR LEFT FLOURO GUIDE CV LINE; ADDITIONAL ARTERIOGRAPHY; IR ULTRASOUND GUIDANCE VASC ACCESS LEFT; ARTERIOGRAPHY Date: 01/23/2015 PROCEDURE: 1. Ultrasound-guided puncture left common femoral artery 2. Catheterization of the superior mesenteric artery with arteriogram 3. Catheterization of the celiac artery with arteriogram 4. Catheterization of the gastroduodenal artery with arteriogram 5. Catheterization of the right gastroepiploic artery with arteriogram 6. Coil embolization of the gastroduodenal artery 7. Post embolization arteriogram 8. Limited left common femoral arteriogram 9. Ultrasound-guided puncture of the left common femoral vein 10. Placement of a triple-lumen central venous catheter via the left femoral vein with fluoroscopic guidance Interventional Radiologist:  Criselda Peaches, MD ANESTHESIA/SEDATION: Moderate (conscious) sedation was used. 1 mg Versed, 50 mcg Fentanyl were administered intravenously. The patient's vital signs were monitored continuously by radiology nursing throughout the procedure. Sedation Time: 60 minutes MEDICATIONS: None additional FLUOROSCOPY TIME:  16 minutes for a total of 622 mGy CONTRAST:  136m OMNIPAQUE IOHEXOL 300 MG/ML  SOLN TECHNIQUE: Informed consent  was obtained from the patient following explanation of the procedure, risks, benefits and alternatives. The patient understands, agrees and consents for the procedure. All questions were addressed. A time out was performed. Maximal barrier sterile technique utilized including caps, mask, sterile gowns, sterile gloves, large sterile drape, hand hygiene, and Betadine skin prep. The left groin was interrogated with ultrasound. The common femoral artery is found to be widely patent. An image was obtained and stored for the medical record. Local anesthesia was attained by infiltration with 1% lidocaine. Under real-time sonographic guidance, the vessel was punctured with a 21 gauge micropuncture needle. Using standard technique, the initial micro wire was exchanged through a transitional 5 [place] sheath for a working 0.035 inch Bentson wire. The micro sheath was then exchanged for a working 5 [place] sheath. A C2 cobra catheter was advanced into the abdominal aorta over the wire. The catheter was initially advanced into the superior mesenteric artery and superior mesenteric arteriogram was performed. There is conventional anatomy. No replaced or accessory right hepatic artery. No evidence of active extravasation or significant collateralization through the pancreaticoduodenal arcade. The C2 cobra catheter was next advanced into the celiac artery. An arteriogram was performed. The gastroduodenal artery is elongated and slightly irregular in the mid segment likely secondary to external mass effect. There is no evidence of active extravasation. A renegade ST micro catheter was advanced coaxially over a fat and 16 wire. The micro catheter was advanced into the gastroduodenal artery. Arteriography was again performed. There are multiple branches arising from the gastroduodenal artery providing supply to a hypervascular mass in the region the descending duodenum. The micro catheter was carefully advanced more  distally into the proximal right gastroepiploic artery. Contrast injection was performed confirming the location of the catheter tip as well as confirming no additional supplied to the region of tumor. Coil embolization was then performed of the gastroduodenal artery using a combination of vortex, standard helical and soft interlock detachable micro coils ranging in size from 2-6 mm. Post embolization arteriography confirms successful embolization of the vessel with no further visible hypervascular blush of the duodenum tumor. The micro catheter was removed. Repeat celiac artery injection was performed confirming no additional collateral supply. The 5 French catheter was readvanced of the superior mesenteric artery and an additional angiogram was performed again confirming no collateral flow to the embolized territory.  The catheters were removed. The vascular sheath was left in place for hemodynamic monitoring. Ultrasound was again used to interrogate the left groin. The left common femoral vein was identified. Local anesthesia was attained by infiltration with 1% lidocaine. The left common femoral vein was punctured with an 18 gauge needle. A wire was advanced into the inferior vena cava. The skin tract was dilated and an arrow triple-lumen central venous catheter advanced over the wire and position with the tip at the confluence of the IVC and left common iliac vein. The catheter was flushed, capped and secured with 0 Prolene suture. Sterile bandages were applied. COMPLICATIONS: None IMPRESSION: 1. No evidence of active extravasation. 2. 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 3. Coil embolization of the gastroduodenal artery. 4. Placement of a left femoral triple-lumen central venous catheter. The catheter tip is at the confluence of the IVC and iliac vein and ready for immediate use. Signed, Criselda Peaches, MD [facility] Radiology Electronically Signed   By: Jacqulynn Cadet M.D.   On: 01/23/2015 15:50   Ir US Guide Vasc Access Left  01/23/2015  CLINICAL DATA:  64 year old male presents with acute life-threatening upper GI bleed secondary to an ulcerated mass encompassing the distal stomach and duodenum. 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 Endoscopic treatment options are limited and surgery is extremely high risk. Patient presents for emergent visceral arteriography and embolization. Additionally, central venous access is required. A triple-lumen central venous catheter will be placed during the course of the procedure. EXAM: SELECTIVE VISCERAL ARTERIOGRAPHY; IR EMBO ART VEN HEMORR LYMPH EXTRAV INC GUIDE ROADMAPPING; IR LEFT FLOURO GUIDE CV LINE; ADDITIONAL ARTERIOGRAPHY; IR ULTRASOUND GUIDANCE VASC ACCESS LEFT; ARTERIOGRAPHY Date: 01/23/2015 PROCEDURE: 1. Ultrasound-guided puncture left common femoral artery 2. Catheterization of the superior mesenteric artery with arteriogram 3. Catheterization of the celiac artery with arteriogram 4. Catheterization of the gastroduodenal artery with arteriogram 5. Catheterization of the right gastroepiploic artery with arteriogram 6. Coil embolization of the gastroduodenal artery 7. Post embolization arteriogram 8. Limited left common femoral arteriogram 9. Ultrasound-guided puncture of the left common femoral vein 10. Placement of a triple-lumen central venous catheter via the left femoral vein with fluoroscopic guidance Interventional Radiologist:  Criselda Peaches, MD ANESTHESIA/SEDATION: Moderate (conscious) sedation was used. 1 mg Versed, 50 mcg Fentanyl were administered intravenously. The patient's vital signs were monitored continuously by radiology nursing throughout the procedure. Sedation Time: 60 minutes MEDICATIONS: None additional  FLUOROSCOPY TIME:  16 minutes for a total of 622 mGy CONTRAST:  139m OMNIPAQUE IOHEXOL 300 MG/ML  SOLN TECHNIQUE: Informed consent was obtained from the patient following explanation of the procedure, risks, benefits and alternatives. The patient understands, agrees and consents for the procedure. All questions were addressed. A time out was performed. Maximal barrier sterile technique utilized including caps, mask, sterile gowns, sterile gloves, large sterile drape, hand hygiene, and Betadine skin prep. The left groin was interrogated with ultrasound. The common femoral artery is found to be widely patent. An image was obtained and stored for the medical record. Local anesthesia was attained by infiltration with 1% lidocaine. Under real-time sonographic guidance, the vessel was punctured with a 21 gauge micropuncture needle. Using standard technique, the initial micro wire was exchanged through a transitional 5 [place] sheath for a working 0.035 inch Bentson wire. The micro sheath was then exchanged for  a working 5 [place] vascular sheath. A C2 cobra catheter was advanced into the abdominal aorta over the wire. The catheter was initially advanced into the superior mesenteric artery and superior mesenteric arteriogram was performed. There is conventional anatomy. No replaced or accessory right hepatic artery. No evidence of active extravasation or significant collateralization through the pancreaticoduodenal arcade. The C2 cobra catheter was next advanced into the celiac artery. An arteriogram was performed. The gastroduodenal artery is elongated and slightly irregular in the mid segment likely secondary to external mass effect. There is no evidence of active extravasation. A renegade ST micro catheter was advanced coaxially over a fat and 16 wire. The micro catheter was advanced into the gastroduodenal artery. Arteriography was again performed. There are multiple branches arising from the gastroduodenal artery  providing supply to a hypervascular mass in the region the descending duodenum. The micro catheter was carefully advanced more distally into the proximal right gastroepiploic artery. Contrast injection was performed confirming the location of the catheter tip as well as confirming no additional supplied to the region of tumor. Coil embolization was then performed of the gastroduodenal artery using a combination of vortex, standard helical and soft interlock detachable micro coils ranging in size from 2-6 mm. Post embolization arteriography confirms successful embolization of the vessel with no further visible hypervascular blush of the duodenum tumor. The micro catheter was removed. Repeat celiac artery injection was performed confirming no additional collateral supply. The 5 French catheter was readvanced of the superior mesenteric artery and an additional angiogram was performed again confirming no collateral flow to the embolized territory. The catheters were removed. The vascular sheath was left in place for hemodynamic monitoring. Ultrasound was again used to interrogate the left groin. The left common femoral vein was identified. Local anesthesia was attained by infiltration with 1% lidocaine. The left common femoral vein was punctured with an 18 gauge needle. A wire was advanced into the inferior vena cava. The skin tract was dilated and an arrow triple-lumen central venous catheter advanced over the wire and position with the tip at the confluence of the IVC and left common iliac vein. The catheter was flushed, capped and secured with 0 Prolene suture. Sterile bandages were applied. COMPLICATIONS: None IMPRESSION: 1. No evidence of active extravasation. 2. 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 3. Coil embolization of the gastroduodenal artery. 4. Placement of a left femoral triple-lumen central venous catheter. The  catheter tip is at the confluence of the IVC and iliac vein and ready for immediate use. Signed, Criselda Peaches, MD [facility] Radiology Electronically Signed   By: Jacqulynn Cadet M.D.   On: 01/23/2015 15:50   Portable Chest Xray  01/24/2015  CLINICAL DATA:  Respiratory failure. EXAM: PORTABLE CHEST 1 VIEW COMPARISON:  01/23/2015 FINDINGS: Endotracheal tube and NG tube in stable position. Cardiomegaly with normal pulmonary vascularity.Persistent increased density noted over the right Lower lung, most likely layering pleural effusion. Underlying pulmonary infiltrate cannot be excluded. Small left pleural effusion cannot be excluded. Low lung volumes basilar atelectasis. No pneumothorax. IMPRESSION: 1. Lines and tubes in stable position. 2. Persistent increased density of the right lower lung, most likely layering pleural effusion. Underlying infiltrate cannot be excluded. 3. Low lung volumes with basilar atelectasis. Electronically Signed   By: [place]   On: 01/24/2015 07:39   Dg Chest Portable 1 View  01/23/2015  CLINICAL DATA:  Assess orogastric  tube positioning EXAM: PORTABLE CHEST 1 VIEW COMPARISON:  Portable chest x-ray of earlier today at 8:01 a.m. FINDINGS: The esophagogastric tube tip projects below the inferior margin of the image. There is no evidence of cord ongoing within the esophagus. The endotracheal tube tip lies 4.1 cm above the carina. The cardiac silhouette is mildly enlarged but stable. The central pulmonary vascularity is engorged especially on the right. There is mild to moderate pulmonary interstitial edema on the right. There is no pleural effusion or pneumothorax. The observed bony thorax exhibits no acute abnormality. External pacemaker -defibrillator pads. Are present IMPRESSION: 1. The esophagogastric tube tip projects below the inferior margin of the image with no evidence of coiling within the esophagus. The  endotracheal tube is in appropriate position. 2. Persistently increased perihilar interstitial density especially on the right consistent with pulmonary interstitial edema. There may be pleural fluid on the right layering posteriorly contributing to the findings here. Electronically Signed   By: David  Martinique M.D.   On: 01/23/2015 09:22   Dg Chest Port 1 View  01/23/2015  CLINICAL DATA:  Status post intubation.  Intra abdominal mass. EXAM: PORTABLE CHEST 1 VIEW COMPARISON:  CT chest 01/21/2015.  CT abdomen 01/21/2015. FINDINGS: The patient has a new endotracheal tube in place with the tip 3.5 cm above the carina. Hazy opacity over the right chest is consistent with layering pleural effusion. Smaller left pleural effusion is noted. No consolidative process or pneumothorax. Heart size is upper normal. IMPRESSION: ET tube in good position with the tip projecting 3.5 cm above the carina. Right greater than left pleural effusions. Electronically Signed   By: Inge Rise M.D.   On: 01/23/2015 08:19   [place] Guide Roadmapping  01/23/2015  CLINICAL DATA:  64 year old male presents with acute life-threatening upper GI bleed secondary to an ulcerated mass encompassing the distal stomach and duodenum. 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 Endoscopic treatment options are limited and surgery is extremely high risk. Patient presents for emergent visceral arteriography and embolization. Additionally, central venous access is required. A triple-lumen central venous catheter will be placed during the course of the procedure. EXAM: SELECTIVE VISCERAL ARTERIOGRAPHY; IR EMBO ART VEN HEMORR LYMPH EXTRAV INC GUIDE ROADMAPPING; IR LEFT FLOURO GUIDE CV LINE; ADDITIONAL ARTERIOGRAPHY; IR ULTRASOUND GUIDANCE VASC ACCESS LEFT; ARTERIOGRAPHY Date: 01/23/2015 PROCEDURE: 1. Ultrasound-guided puncture left  common femoral artery 2. Catheterization of the superior mesenteric artery with arteriogram 3. Catheterization of the celiac artery with arteriogram 4. Catheterization of the gastroduodenal artery with arteriogram 5. Catheterization of the right gastroepiploic artery with arteriogram 6. Coil embolization of the gastroduodenal artery 7. Post embolization arteriogram 8. Limited left common femoral arteriogram 9. Ultrasound-guided puncture of the left common femoral vein 10. Placement of a triple-lumen central venous catheter via the left femoral vein with fluoroscopic guidance Interventional Radiologist:  Criselda Peaches, MD ANESTHESIA/SEDATION: Moderate (conscious) sedation was used. 1 mg Versed, 50 mcg Fentanyl were administered intravenously. The patient's vital signs were monitored continuously by radiology nursing throughout the procedure. Sedation Time: 60 minutes MEDICATIONS: None additional FLUOROSCOPY TIME:  16 minutes for a total of 622 mGy CONTRAST:  144m OMNIPAQUE IOHEXOL 300 MG/ML  SOLN TECHNIQUE: Informed consent was obtained from the patient following explanation of the procedure, risks, benefits and alternatives. The patient understands, agrees and consents for the procedure. All questions were addressed. A time out was performed. Maximal barrier  sterile technique utilized including caps, mask, sterile gowns, sterile gloves, large sterile drape, hand hygiene, and Betadine skin prep. The left groin was interrogated with ultrasound. The common femoral artery is found to be widely patent. An image was obtained and stored for the medical record. Local anesthesia was attained by infiltration with 1% lidocaine. Under real-time sonographic guidance, the vessel was punctured with a 21 gauge micropuncture needle. Using standard technique, the initial micro wire was exchanged through a transitional 5 [place] micro sheath for a working 0.035 inch Bentson wire. The micro sheath was then exchanged for a working  5 [place] vascular sheath. A C2 cobra catheter was advanced into the abdominal aorta over the wire. The catheter was initially advanced into the superior mesenteric artery and superior mesenteric arteriogram was performed. There is conventional anatomy. No replaced or accessory right hepatic artery. No evidence of active extravasation or significant collateralization through the pancreaticoduodenal arcade. The C2 cobra catheter was next advanced into the celiac artery. An arteriogram was performed. The gastroduodenal artery is elongated and slightly irregular in the mid segment likely secondary to external mass effect. There is no evidence of active extravasation. A renegade ST micro catheter was advanced coaxially over a fat and 16 wire. The micro catheter was advanced into the gastroduodenal artery. Arteriography was again performed. There are multiple branches arising from the gastroduodenal artery providing supply to a hypervascular mass in the region the descending duodenum. The micro catheter was carefully advanced more distally into the proximal right gastroepiploic artery. Contrast injection was performed confirming the location of the catheter tip as well as confirming no additional supplied to the region of tumor. Coil embolization was then performed of the gastroduodenal artery using a combination of vortex, standard helical and soft interlock detachable micro coils ranging in size from 2-6 mm. Post embolization arteriography confirms successful embolization of the vessel with no further visible hypervascular blush of the duodenum tumor. The micro catheter was removed. Repeat celiac artery injection was performed confirming no additional collateral supply. The 5 French catheter was readvanced of the superior mesenteric artery and an additional angiogram was performed again confirming no collateral flow to the embolized territory. The catheters were removed. The vascular sheath was left in place for  hemodynamic monitoring. Ultrasound was again used to interrogate the left groin. The left common femoral vein was identified. Local anesthesia was attained by infiltration with 1% lidocaine. The left common femoral vein was punctured with an 18 gauge needle. A wire was advanced into the inferior vena cava. The skin tract was dilated and an arrow triple-lumen central venous catheter advanced over the wire and position with the tip at the confluence of the IVC and left common iliac vein. The catheter was flushed, capped and secured with 0 Prolene suture. Sterile bandages were applied. COMPLICATIONS: None IMPRESSION: 1. No evidence of active extravasation. 2. 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 3. Coil embolization of the gastroduodenal artery. 4. Placement of a left femoral triple-lumen central venous catheter. The catheter tip is at the confluence of the IVC and iliac vein and ready for immediate use. Signed, Criselda Peaches, MD [facility] Radiology Electronically Signed   By: Jacqulynn Cadet M.D.   On: 01/23/2015 15:50    Assessment: 64 y.o. 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 He has a high-risk mantle cell lymphoma and will benefit from chemotherapy. In about 1/2 the cases we can obtain a complete response. However there is no standard curative therapy for this type of lymphoma, which almost always recurs despite initial response, and allogeneic transplantation will be a consideration at some point in the  future.  Chemo can all be done outpatient and will be facilitated by a port. I will also need an initial echo and bone marrow biopsy. The real issue is the danger of perforation. That risk exists with or without chemotherapy of course. I will be interested in critical care/surgery/IR's opinion re discharge plans (note patient's interest in being home for Christmas).  I will write for the echo, port and bone marrow biopsy but feel free to postpone at your discretion.  Will follow tomorrow and could meet w team 7:30 AM or so if that would be helpful.    Chauncey Cruel, MD 01/25/2015  7:59 AM Medical Oncology and Hematology [facility] [address] [place], [place] [address] Tel. [phone]    Fax. [phone]

## 2015-01-25 NOTE — Progress Notes (Signed)
[facility] PROGRESS NOTE  WALTON DIGILIO PQZ:[id number] DOB: 1950/02/09 DOA: 01/23/2015 PCP: No primary care provider on file.  Assessment/Plan: #1 syncope Secondary to MRI ejection/hypovolemic saw in the setting of severe anemia secondary to large prepyloric channel/duodenal bulb ulcer. EGD status post: Embolization of gastroduodenal artery 01/23/2015. Patient noted to have large watery bloody bowel movement overnight and none since. Blood pressure has remained stable. Hemoglobin is stable at 7.1. Continue proton extra. Continue supportive care. IV fluids. Follow.  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 Status post coil embolization of gastroduodenal artery 01/23/2015. Patient with large watery bloody bowel movement early this morning however none since. Hemoglobin stable at 7.1. Blood pressure is stable. Continue Protonix drip. Octreotide drip has been discontinued. NG tube has been clamped and patient started on clears with GI. Per GI patient high-risk for rebleed. Follow H&H. Monitor closely. Per GI.  #3 acute renal failure Likely secondary to prerenal azotemia. Improved.  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 Patient is being followed by oncology who is recommending chemotherapy which may be done in the outpatient setting. 2-D echo on bone marrow biopsy has been ordered per oncology. Oncology following and appreciate input and recommendations.  #5 hypertension Stable. BP meds on hold. Follow.  #6 high-risk for aspiration/status post vent-dependent respiratory failure Patient intubated on admission for airway protection. Patient has been extubated and is doing fine. Patient with a leukocytosis. Chest x-ray negative for any acute infiltrate. Patient with no respiratory symptoms. Urinalysis is nitrite negative leukocytes negative. Urine cultures pending. Blood  cultures pending. Continue empiric IV Zosyn. Follow.  #7 prophylaxis PPI for GI prophylaxis. SCDs for DVT prophylaxis.   Code Status: Full Family Communication: Updated patient and sister at bedside. Disposition Plan: Remain in the step down unit. Home. Gastroenterology and general surgery.   Consultants:  PCCM admission  Gen. surgery: Dr. Georgette Dover  Gastroenterology: Dr. Oletta Lamas 01/23/2015  Interventional radiology: Dr. Geroge Baseman 01/23/2015  Hematology/oncology Dr. Jana Hakim 01/24/2015  Procedures:  CT head CT C-spines 01/23/2015  Chest x-ray 01/23/2015, 01/24/2015  Coil embolization of gastroduodenal artery per Dr. Geroge Baseman 01/23/2015  ETT 01/23/2015>>>>> 01/24/2015  NG tube 01/23/2015  Central line 01/23/2015   SIGNIFICANT EVENTS: 12/14: ED with syncope, dizziness, black tools and was found to be profoundly anemic. CT and EGD done; large duodenal mass and suspicion for lymphoma  12/19: Right inguinal lymph node biopsy 12/20: Syncope and ED with extensive GI bleed  Antibiotics:  IV Zosyn 01/23/2015  HPI/Subjective: Patient sitting up on the side of the bed. No chest pain. No shortness of breath. No nausea. No vomiting. Patient denies any abdominal pain. Per nursing patient with large watery bloody bowel movement early this morning around 4 AM. No further bloody bowel movements. Patient asking when he gets to be discharged home for Christmas.  Objective: Filed Vitals:   01/25/15 1018 01/25/15 1021  BP:    Pulse: 134 79  Temp:    Resp: 26 19    Intake/Output Summary (Last 24 hours) at 01/25/15 1031 Last data filed at 01/25/15 1012  Gross per 24 hour  Intake 6079.17 ml  Output   2600 ml  Net 3479.17 ml   Filed Weights   01/24/15 0500 01/24/15 2056 01/25/15 0329  Weight: 99 kg (218 lb 4.1 oz) 96 kg (211 lb 10.3 oz) 96.934 kg (213 lb 11.2 oz)    Exam:   General:  NAD. NGT in place  Cardiovascular: RRR  Respiratory: CTAB  Abdomen:  Soft/NT/ND/+BS  Musculoskeletal: No c./c/e  Data Reviewed: Basic Metabolic Panel:  Recent Labs Lab 01/21/15 0422 01/22/15 0400 01/23/15 0751 01/23/15 0812 01/24/15 0430 01/25/15 0355  NA 138 138 147* 142 141 141  K 3.7 3.8 5.7* 5.5* 4.3 4.1  CL 108 106 111 113* 114* 113*  CO2 24 25 8*  --  21* 23  GLUCOSE 108* 98 359* 328* 142* 112*  BUN <5* <5* 11 13 21* 19  CREATININE 1.03 0.94 1.71* 1.50* 1.65* 1.21  CALCIUM 7.9* 8.1* 9.2  --  6.7* 7.4*  MG  --   --   --   --  1.8 2.1  PHOS  --   --   --   --  4.6 3.0   Liver Function Tests:  Recent Labs Lab 01/19/15 0235 01/21/15 0422 01/23/15 0751 01/24/15 0430  AST 35 23 44* 39  ALT 73* 50 37 37  ALKPHOS 62 64 67 61  BILITOT 0.8 0.5 0.2* 0.5  PROT 5.1* 5.3* 4.5* 4.1*  ALBUMIN 2.4* 2.5* 2.3* 2.0*   No results for input(s): LIPASE, AMYLASE in the last 168 hours.  Recent Labs Lab 01/23/15 0800  AMMONIA 336*   CBC:  Recent Labs Lab 01/23/15 1710 01/23/15 2230 01/24/15 0430 01/25/15 0355 01/25/15 0830  WBC 19.2* 17.0* 14.5* 10.5 11.1*  NEUTROABS  --   --   --  8.9*  --   HGB 8.6* 8.0* 7.5* 7.1* 7.1*  HCT 25.5* 23.7* 22.1* 20.9* 21.8*  MCV 85.0 84.0 84.0 85.7 86.2  PLT 224 216 207 200 231   Cardiac Enzymes: No results for input(s): CKTOTAL, CKMB, CKMBINDEX, TROPONINI in the last 168 hours. BNP (last 3 results) No results for input(s): BNP in the last 8760 hours.  ProBNP (last 3 results) No results for input(s): PROBNP in the last 8760 hours.  CBG:  Recent Labs Lab 01/24/15 1137 01/24/15 1539 01/24/15 2317 01/25/15 0331 01/25/15 0734  GLUCAP 116* 113* 110* 109* 104*    Recent Results (from the past 240 hour(s))  MRSA PCR Screening     Status: None   Collection Time: 01/17/15  9:07 PM  Result Value Ref Range Status   MRSA by PCR NEGATIVE NEGATIVE Final    Comment:        The GeneXpert MRSA Assay (FDA approved for NASAL specimens only), is one component of a comprehensive MRSA  colonization surveillance program. It is not intended to diagnose MRSA infection nor to guide or monitor treatment for MRSA infections.   Surgical pcr screen     Status: Abnormal   Collection Time: 01/22/15  3:10 AM  Result Value Ref Range Status   MRSA, PCR NEGATIVE NEGATIVE Final   Staphylococcus aureus POSITIVE (A) NEGATIVE Final    Comment:        The Xpert SA Assay (FDA approved for NASAL specimens in patients over 52 years of age), is one component of a comprehensive surveillance program.  Test performance has been validated by [facility] for patients greater than or equal to 30 year old. It is not intended to diagnose infection nor to guide or monitor treatment.   Culture, blood (routine x 2)     Status: None (Preliminary result)   Collection Time: 01/23/15  8:00 AM  Result Value Ref Range Status   Specimen Description BLOOD RIGHT ANTECUBITAL  Final   Special Requests BOTTLES DRAWN AEROBIC AND ANAEROBIC  Final   Culture NO GROWTH 1  DAY  Final   Report Status PENDING  Incomplete  Urine culture     Status: None   Collection Time: 01/23/15  8:05 AM  Result Value Ref Range Status   Specimen Description URINE, CATHETERIZED  Final   Special Requests Normal  Final   Culture NO GROWTH 1 DAY  Final   Report Status 01/24/2015 FINAL  Final  Culture, blood (routine x 2)     Status: None (Preliminary result)   Collection Time: 01/23/15  4:33 PM  Result Value Ref Range Status   Specimen Description BLOOD RIGHT HAND  Final   Special Requests BOTTLES DRAWN AEROBIC ONLY 4CC  Final   Culture NO GROWTH < 24 HOURS  Final   Report Status PENDING  Incomplete     Studies: Ct Head Wo Contrast  01/23/2015  CLINICAL DATA:  64 year old male with fall. EXAM: CT HEAD WITHOUT CONTRAST CT CERVICAL SPINE WITHOUT CONTRAST TECHNIQUE: Multidetector CT imaging of the head and cervical spine was performed following the standard protocol without intravenous contrast. Multiplanar CT image  reconstructions of the cervical spine were also generated. COMPARISON:  None. FINDINGS: CT HEAD FINDINGS The ventricles and sulci are appropriate in size for patient's age. Mild periventricular and deep white matter hypodensities represent chronic microvascular ischemic changes. There is no intracranial hemorrhage. No mass effect or midline shift identified. An endotracheal and enteric tube are partially visualized. There is diffuse mucoperiosteal thickening of the paranasal sinuses with partial opacification of the ethmoid air cells. The mastoid air cells are well aerated. Right occipital scalp hematoma. The calvarium is intact. CT CERVICAL SPINE FINDINGS There is no acute fracture or subluxation of the cervical spine.Mild multilevel degenerative changes.The odontoid and spinous processes are intact.There is normal anatomic alignment of the C1-C2 lateral masses. The visualized soft tissues appear unremarkable. An enteric tube and an endotracheal tube are partially visualized. Partially visualized bilateral pleural effusions, right greater than left. There is bilateral supraclavicular and right cervical adenopathy. Upper mediastinal lymphadenopathy noted. Chest CT is recommended for further evaluation. Right supraclavicular soft tissue hemorrhage noted. IMPRESSION: No acute intracranial hemorrhage. 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 No acute/traumatic cervical spine pathology. Bilateral supraclavicular and right cervical as well as mediastinal adenopathy. Further evaluation recommended. Electronically Signed   By: Anner Crete M.D.   On: 01/23/2015 18:27   Ct Cervical Spine Wo Contrast  01/23/2015  CLINICAL DATA:  64 year old male with fall. EXAM: CT HEAD WITHOUT CONTRAST CT CERVICAL SPINE WITHOUT CONTRAST TECHNIQUE: Multidetector CT imaging of the head and cervical spine was performed following the standard protocol without intravenous contrast. Multiplanar CT image  reconstructions of the cervical spine were also generated. COMPARISON:  None. FINDINGS: CT HEAD FINDINGS The ventricles and sulci are appropriate in size for patient's age. Mild periventricular and deep white matter hypodensities represent chronic microvascular ischemic changes. There is no intracranial hemorrhage. No mass effect or midline shift identified. An endotracheal and enteric tube are partially visualized. There is diffuse mucoperiosteal thickening of the paranasal sinuses with partial opacification of the ethmoid air cells. The mastoid air cells are well aerated. Right occipital scalp hematoma. The calvarium is intact. CT CERVICAL SPINE FINDINGS There is no acute fracture or subluxation of the cervical spine.Mild multilevel degenerative changes.The odontoid and spinous processes are intact.There is normal anatomic alignment of the C1-C2 lateral masses. The visualized soft tissues appear unremarkable. An enteric tube and an endotracheal tube are partially visualized. Partially visualized bilateral pleural effusions, right greater than left. There is  bilateral supraclavicular and right cervical adenopathy. Upper mediastinal lymphadenopathy noted. Chest CT is recommended for further evaluation. Right supraclavicular soft tissue hemorrhage noted. IMPRESSION: No acute intracranial hemorrhage. 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 No acute/traumatic cervical spine pathology. Bilateral supraclavicular and right cervical as well as mediastinal adenopathy. Further evaluation recommended. Electronically Signed   By: Anner Crete M.D.   On: 01/23/2015 18:27   Ir Angiogram Visceral Selective  01/23/2015  CLINICAL DATA:  64 year old male presents with acute life-threatening upper GI bleed secondary to an ulcerated mass encompassing the distal stomach and duodenum. The patient's underlying etiology is favored to represent lymphoma which should is currently undergoing confirmatory  diagnosis. Endoscopic treatment options are limited and surgery is extremely high risk. Patient presents for emergent visceral arteriography and embolization. Additionally, central venous access is required. A triple-lumen central venous catheter will be placed during the course of the procedure. EXAM: SELECTIVE VISCERAL ARTERIOGRAPHY; IR EMBO ART VEN HEMORR LYMPH EXTRAV INC GUIDE ROADMAPPING; IR LEFT FLOURO GUIDE CV LINE; ADDITIONAL ARTERIOGRAPHY; IR ULTRASOUND GUIDANCE VASC ACCESS LEFT; ARTERIOGRAPHY Date: 01/23/2015 PROCEDURE: 1. Ultrasound-guided puncture left common femoral artery 2. Catheterization of the superior mesenteric artery with arteriogram 3. Catheterization of the celiac artery with arteriogram 4. Catheterization of the gastroduodenal artery with arteriogram 5. Catheterization of the right gastroepiploic artery with arteriogram 6. Coil embolization of the gastroduodenal artery 7. Post embolization arteriogram 8. Limited left common femoral arteriogram 9. Ultrasound-guided puncture of the left common femoral vein 10. Placement of a triple-lumen central venous catheter via the left femoral vein with fluoroscopic guidance Interventional Radiologist:  Criselda Peaches, MD ANESTHESIA/SEDATION: Moderate (conscious) sedation was used. 1 mg Versed, 50 mcg Fentanyl were administered intravenously. The patient's vital signs were monitored continuously by radiology nursing throughout the procedure. Sedation Time: 60 minutes MEDICATIONS: None additional FLUOROSCOPY TIME:  16 minutes for a total of 622 mGy CONTRAST:  11m OMNIPAQUE IOHEXOL 300 MG/ML  SOLN TECHNIQUE: Informed consent was obtained from the patient following explanation of the procedure, risks, benefits and alternatives. The patient understands, agrees and consents for the procedure. All questions were addressed. A time out was performed. Maximal barrier sterile technique utilized including caps, mask, sterile gowns, sterile gloves, large  sterile drape, hand hygiene, and Betadine skin prep. The left groin was interrogated with ultrasound. The common femoral artery is found to be widely patent. An image was obtained and stored for the medical record. Local anesthesia was attained by infiltration with 1% lidocaine. Under real-time sonographic guidance, the vessel was punctured with a 21 gauge micropuncture needle. Using standard technique, the initial micro wire was exchanged through a transitional 5 [place] sheath for a working 0.035 inch Bentson wire. The micro sheath was then exchanged for a working 5 [place] sheath. A C2 cobra catheter was advanced into the abdominal aorta over the wire. The catheter was initially advanced into the superior mesenteric artery and superior mesenteric arteriogram was performed. There is conventional anatomy. No replaced or accessory right hepatic artery. No evidence of active extravasation or significant collateralization through the pancreaticoduodenal arcade. The C2 cobra catheter was next advanced into the celiac artery. An arteriogram was performed. The gastroduodenal artery is elongated and slightly irregular in the mid segment likely secondary to external mass effect. There is no evidence of active extravasation. A renegade ST micro catheter was advanced coaxially over a fat and 16 wire. The micro catheter was advanced into the gastroduodenal artery. Arteriography was again performed. There are multiple branches arising  from the gastroduodenal artery providing supply to a hypervascular mass in the region the descending duodenum. The micro catheter was carefully advanced more distally into the proximal right gastroepiploic artery. Contrast injection was performed confirming the location of the catheter tip as well as confirming no additional supplied to the region of tumor. Coil embolization was then performed of the gastroduodenal artery using a combination of vortex, standard helical and soft  interlock detachable micro coils ranging in size from 2-6 mm. Post embolization arteriography confirms successful embolization of the vessel with no further visible hypervascular blush of the duodenum tumor. The micro catheter was removed. Repeat celiac artery injection was performed confirming no additional collateral supply. The 5 French catheter was readvanced of the superior mesenteric artery and an additional angiogram was performed again confirming no collateral flow to the embolized territory. The catheters were removed. The vascular sheath was left in place for hemodynamic monitoring. Ultrasound was again used to interrogate the left groin. The left common femoral vein was identified. Local anesthesia was attained by infiltration with 1% lidocaine. The left common femoral vein was punctured with an 18 gauge needle. A wire was advanced into the inferior vena cava. The skin tract was dilated and an arrow triple-lumen central venous catheter advanced over the wire and position with the tip at the confluence of the IVC and left common iliac vein. The catheter was flushed, capped and secured with 0 Prolene suture. Sterile bandages were applied. COMPLICATIONS: None IMPRESSION: 1. No evidence of active extravasation. 2. 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 3. Coil embolization of the gastroduodenal artery. 4. Placement of a left femoral triple-lumen central venous catheter. The catheter tip is at the confluence of the IVC and iliac vein and ready for immediate use. Signed, Criselda Peaches, MD [facility] Radiology Electronically Signed   By: Jacqulynn Cadet M.D.   On: 01/23/2015 15:50   Ir Angiogram Visceral Selective  01/23/2015  CLINICAL DATA:  64 year old male presents with acute life-threatening upper GI bleed secondary to an ulcerated mass encompassing the  distal stomach and duodenum. 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 Endoscopic treatment options are limited and surgery is extremely high risk. Patient presents for emergent visceral arteriography and embolization. Additionally, central venous access is required. A triple-lumen central venous catheter will be placed during the course of the procedure. EXAM: SELECTIVE VISCERAL ARTERIOGRAPHY; IR EMBO ART VEN HEMORR LYMPH EXTRAV INC GUIDE ROADMAPPING; IR LEFT FLOURO GUIDE CV LINE; ADDITIONAL ARTERIOGRAPHY; IR ULTRASOUND GUIDANCE VASC ACCESS LEFT; ARTERIOGRAPHY Date: 01/23/2015 PROCEDURE: 1. Ultrasound-guided puncture left common femoral artery 2. Catheterization of the superior mesenteric artery with arteriogram 3. Catheterization of the celiac artery with arteriogram 4. Catheterization of the gastroduodenal artery with arteriogram 5. Catheterization of the right gastroepiploic artery with arteriogram 6. Coil embolization of the gastroduodenal artery 7. Post embolization arteriogram 8. Limited left common femoral arteriogram 9. Ultrasound-guided puncture of the left common femoral vein 10. Placement of a triple-lumen central venous catheter via the left femoral vein with fluoroscopic guidance Interventional Radiologist:  Criselda Peaches, MD ANESTHESIA/SEDATION: Moderate (conscious) sedation was used. 1 mg Versed, 50 mcg Fentanyl were administered intravenously. The patient's vital signs were monitored continuously by radiology nursing throughout the procedure. Sedation Time: 60 minutes MEDICATIONS: None additional FLUOROSCOPY TIME:  16 minutes for a total of 622 mGy CONTRAST:  146m OMNIPAQUE IOHEXOL  300 MG/ML  SOLN TECHNIQUE: Informed consent was obtained from the patient following explanation of the procedure, risks, benefits and alternatives. The patient understands, agrees and consents for the procedure. All questions were  addressed. A time out was performed. Maximal barrier sterile technique utilized including caps, mask, sterile gowns, sterile gloves, large sterile drape, hand hygiene, and Betadine skin prep. The left groin was interrogated with ultrasound. The common femoral artery is found to be widely patent. An image was obtained and stored for the medical record. Local anesthesia was attained by infiltration with 1% lidocaine. Under real-time sonographic guidance, the vessel was punctured with a 21 gauge micropuncture needle. Using standard technique, the initial micro wire was exchanged through a transitional 5 [place] micro sheath for a working 0.035 inch Bentson wire. The micro sheath was then exchanged for a working 5 [place] vascular sheath. A C2 cobra catheter was advanced into the abdominal aorta over the wire. The catheter was initially advanced into the superior mesenteric artery and superior mesenteric arteriogram was performed. There is conventional anatomy. No replaced or accessory right hepatic artery. No evidence of active extravasation or significant collateralization through the pancreaticoduodenal arcade. The C2 cobra catheter was next advanced into the celiac artery. An arteriogram was performed. The gastroduodenal artery is elongated and slightly irregular in the mid segment likely secondary to external mass effect. There is no evidence of active extravasation. A renegade ST micro catheter was advanced coaxially over a fat and 16 wire. The micro catheter was advanced into the gastroduodenal artery. Arteriography was again performed. There are multiple branches arising from the gastroduodenal artery providing supply to a hypervascular mass in the region the descending duodenum. The micro catheter was carefully advanced more distally into the proximal right gastroepiploic artery. Contrast injection was performed confirming the location of the catheter tip as well as confirming no additional supplied to the region  of tumor. Coil embolization was then performed of the gastroduodenal artery using a combination of vortex, standard helical and soft interlock detachable micro coils ranging in size from 2-6 mm. Post embolization arteriography confirms successful embolization of the vessel with no further visible hypervascular blush of the duodenum tumor. The micro catheter was removed. Repeat celiac artery injection was performed confirming no additional collateral supply. The 5 French catheter was readvanced of the superior mesenteric artery and an additional angiogram was performed again confirming no collateral flow to the embolized territory. The catheters were removed. The vascular sheath was left in place for hemodynamic monitoring. Ultrasound was again used to interrogate the left groin. The left common femoral vein was identified. Local anesthesia was attained by infiltration with 1% lidocaine. The left common femoral vein was punctured with an 18 gauge needle. A wire was advanced into the inferior vena cava. The skin tract was dilated and an arrow triple-lumen central venous catheter advanced over the wire and position with the tip at the confluence of the IVC and left common iliac vein. The catheter was flushed, capped and secured with 0 Prolene suture. Sterile bandages were applied. COMPLICATIONS: None IMPRESSION: 1. No evidence of active extravasation. 2. 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 3. Coil embolization of the gastroduodenal artery. 4. Placement of a left femoral triple-lumen central venous catheter. The catheter tip is at the confluence of the IVC and iliac vein and ready for immediate use. Signed, Criselda Peaches, MD [facility] Radiology Electronically Signed  By: Jacqulynn Cadet M.D.   On: 01/23/2015 15:50   Ir Angiogram Selective Each Additional  Vessel  01/23/2015  CLINICAL DATA:  64 year old male presents with acute life-threatening upper GI bleed secondary to an ulcerated mass encompassing the distal stomach and duodenum. 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 Endoscopic treatment options are limited and surgery is extremely high risk. Patient presents for emergent visceral arteriography and embolization. Additionally, central venous access is required. A triple-lumen central venous catheter will be placed during the course of the procedure. EXAM: SELECTIVE VISCERAL ARTERIOGRAPHY; IR EMBO ART VEN HEMORR LYMPH EXTRAV INC GUIDE ROADMAPPING; IR LEFT FLOURO GUIDE CV LINE; ADDITIONAL ARTERIOGRAPHY; IR ULTRASOUND GUIDANCE VASC ACCESS LEFT; ARTERIOGRAPHY Date: 01/23/2015 PROCEDURE: 1. Ultrasound-guided puncture left common femoral artery 2. Catheterization of the superior mesenteric artery with arteriogram 3. Catheterization of the celiac artery with arteriogram 4. Catheterization of the gastroduodenal artery with arteriogram 5. Catheterization of the right gastroepiploic artery with arteriogram 6. Coil embolization of the gastroduodenal artery 7. Post embolization arteriogram 8. Limited left common femoral arteriogram 9. Ultrasound-guided puncture of the left common femoral vein 10. Placement of a triple-lumen central venous catheter via the left femoral vein with fluoroscopic guidance Interventional Radiologist:  Criselda Peaches, MD ANESTHESIA/SEDATION: Moderate (conscious) sedation was used. 1 mg Versed, 50 mcg Fentanyl were administered intravenously. The patient's vital signs were monitored continuously by radiology nursing throughout the procedure. Sedation Time: 60 minutes MEDICATIONS: None additional FLUOROSCOPY TIME:  16 minutes for a total of 622 mGy CONTRAST:  19m OMNIPAQUE IOHEXOL 300 MG/ML  SOLN TECHNIQUE: Informed consent was obtained from the patient  following explanation of the procedure, risks, benefits and alternatives. The patient understands, agrees and consents for the procedure. All questions were addressed. A time out was performed. Maximal barrier sterile technique utilized including caps, mask, sterile gowns, sterile gloves, large sterile drape, hand hygiene, and Betadine skin prep. The left groin was interrogated with ultrasound. The common femoral artery is found to be widely patent. An image was obtained and stored for the medical record. Local anesthesia was attained by infiltration with 1% lidocaine. Under real-time sonographic guidance, the vessel was punctured with a 21 gauge micropuncture needle. Using standard technique, the initial micro wire was exchanged through a transitional 5 [place] sheath for a working 0.035 inch Bentson wire. The micro sheath was then exchanged for a working 5 [place] sheath. A C2 cobra catheter was advanced into the abdominal aorta over the wire. The catheter was initially advanced into the superior mesenteric artery and superior mesenteric arteriogram was performed. There is conventional anatomy. No replaced or accessory right hepatic artery. No evidence of active extravasation or significant collateralization through the pancreaticoduodenal arcade. The C2 cobra catheter was next advanced into the celiac artery. An arteriogram was performed. The gastroduodenal artery is elongated and slightly irregular in the mid segment likely secondary to external mass effect. There is no evidence of active extravasation. A renegade ST micro catheter was advanced coaxially over a fat and 16 wire. The micro catheter was advanced into the gastroduodenal artery. Arteriography was again performed. There are multiple branches arising from the gastroduodenal artery providing supply to a hypervascular mass in the region the descending duodenum. The micro catheter was carefully advanced more distally into the proximal right  gastroepiploic artery. Contrast injection was performed confirming the location of the catheter tip as well as confirming no additional supplied to the region of tumor. Coil embolization  was then performed of the gastroduodenal artery using a combination of vortex, standard helical and soft interlock detachable micro coils ranging in size from 2-6 mm. Post embolization arteriography confirms successful embolization of the vessel with no further visible hypervascular blush of the duodenum tumor. The micro catheter was removed. Repeat celiac artery injection was performed confirming no additional collateral supply. The 5 French catheter was readvanced of the superior mesenteric artery and an additional angiogram was performed again confirming no collateral flow to the embolized territory. The catheters were removed. The vascular sheath was left in place for hemodynamic monitoring. Ultrasound was again used to interrogate the left groin. The left common femoral vein was identified. Local anesthesia was attained by infiltration with 1% lidocaine. The left common femoral vein was punctured with an 18 gauge needle. A wire was advanced into the inferior vena cava. The skin tract was dilated and an arrow triple-lumen central venous catheter advanced over the wire and position with the tip at the confluence of the IVC and left common iliac vein. The catheter was flushed, capped and secured with 0 Prolene suture. Sterile bandages were applied. COMPLICATIONS: None IMPRESSION: 1. No evidence of active extravasation. 2. 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 3. Coil embolization of the gastroduodenal artery. 4. Placement of a left femoral triple-lumen central venous catheter. The catheter tip is at the confluence of the IVC and iliac vein and ready for immediate use. Signed, Criselda Peaches, MD [facility]iology  Special[facility] Radiology Electronically Signed   By: Jacqulynn Cadet M.D.   On: 01/23/2015 15:50   Ir Angiogram Follow Up Study  01/23/2015  CLINICAL DATA:  64 year old male presents with acute life-threatening upper GI bleed secondary to an ulcerated mass encompassing the distal stomach and duodenum. 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 Endoscopic treatment options are limited and surgery is extremely high risk. Patient presents for emergent visceral arteriography and embolization. Additionally, central venous access is required. A triple-lumen central venous catheter will be placed during the course of the procedure. EXAM: SELECTIVE VISCERAL ARTERIOGRAPHY; IR EMBO ART VEN HEMORR LYMPH EXTRAV INC GUIDE ROADMAPPING; IR LEFT FLOURO GUIDE CV LINE; ADDITIONAL ARTERIOGRAPHY; IR ULTRASOUND GUIDANCE VASC ACCESS LEFT; ARTERIOGRAPHY Date: 01/23/2015 PROCEDURE: 1. Ultrasound-guided puncture left common femoral artery 2. Catheterization of the superior mesenteric artery with arteriogram 3. Catheterization of the celiac artery with arteriogram 4. Catheterization of the gastroduodenal artery with arteriogram 5. Catheterization of the right gastroepiploic artery with arteriogram 6. Coil embolization of the gastroduodenal artery 7. Post embolization arteriogram 8. Limited left common femoral arteriogram 9. Ultrasound-guided puncture of the left common femoral vein 10. Placement of a triple-lumen central venous catheter via the left femoral vein with fluoroscopic guidance Interventional Radiologist:  Criselda Peaches, MD ANESTHESIA/SEDATION: Moderate (conscious) sedation was used. 1 mg Versed, 50 mcg Fentanyl were administered intravenously. The patient's vital signs were monitored continuously by radiology nursing throughout the procedure. Sedation Time: 60 minutes MEDICATIONS: None additional FLUOROSCOPY TIME:  16 minutes  for a total of 622 mGy CONTRAST:  17m OMNIPAQUE IOHEXOL 300 MG/ML  SOLN TECHNIQUE: Informed consent was obtained from the patient following explanation of the procedure, risks, benefits and alternatives. The patient understands, agrees and consents for the procedure. All questions were addressed. A time out was performed. Maximal barrier sterile technique utilized including caps, mask, sterile gowns, sterile gloves, large sterile drape,  hand hygiene, and Betadine skin prep. The left groin was interrogated with ultrasound. The common femoral artery is found to be widely patent. An image was obtained and stored for the medical record. Local anesthesia was attained by infiltration with 1% lidocaine. Under real-time sonographic guidance, the vessel was punctured with a 21 gauge micropuncture needle. Using standard technique, the initial micro wire was exchanged through a transitional 5 [place] micro sheath for a working 0.035 inch Bentson wire. The micro sheath was then exchanged for a working 5 [place] vascular sheath. A C2 cobra catheter was advanced into the abdominal aorta over the wire. The catheter was initially advanced into the superior mesenteric artery and superior mesenteric arteriogram was performed. There is conventional anatomy. No replaced or accessory right hepatic artery. No evidence of active extravasation or significant collateralization through the pancreaticoduodenal arcade. The C2 cobra catheter was next advanced into the celiac artery. An arteriogram was performed. The gastroduodenal artery is elongated and slightly irregular in the mid segment likely secondary to external mass effect. There is no evidence of active extravasation. A renegade ST micro catheter was advanced coaxially over a fat and 16 wire. The micro catheter was advanced into the gastroduodenal artery. Arteriography was again performed. There are multiple branches arising from the gastroduodenal artery providing supply to a  hypervascular mass in the region the descending duodenum. The micro catheter was carefully advanced more distally into the proximal right gastroepiploic artery. Contrast injection was performed confirming the location of the catheter tip as well as confirming no additional supplied to the region of tumor. Coil embolization was then performed of the gastroduodenal artery using a combination of vortex, standard helical and soft interlock detachable micro coils ranging in size from 2-6 mm. Post embolization arteriography confirms successful embolization of the vessel with no further visible hypervascular blush of the duodenum tumor. The micro catheter was removed. Repeat celiac artery injection was performed confirming no additional collateral supply. The 5 French catheter was readvanced of the superior mesenteric artery and an additional angiogram was performed again confirming no collateral flow to the embolized territory. The catheters were removed. The vascular sheath was left in place for hemodynamic monitoring. Ultrasound was again used to interrogate the left groin. The left common femoral vein was identified. Local anesthesia was attained by infiltration with 1% lidocaine. The left common femoral vein was punctured with an 18 gauge needle. A wire was advanced into the inferior vena cava. The skin tract was dilated and an arrow triple-lumen central venous catheter advanced over the wire and position with the tip at the confluence of the IVC and left common iliac vein. The catheter was flushed, capped and secured with 0 Prolene suture. Sterile bandages were applied. COMPLICATIONS: None IMPRESSION: 1. No evidence of active extravasation. 2. 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 3. Coil embolization of the gastroduodenal artery. 4. Placement of a left femoral triple-lumen central venous catheter. The catheter tip is at the  confluence of the IVC and iliac vein and ready for immediate use. Signed, Criselda Peaches, MD Vascular and Interventional Radiology Specialists [facility] Radiology Electronically Signed   By: Jacqulynn Cadet M.D.   On: 01/23/2015 15:50   Ir Fluoro Guide Cv Line Left  01/23/2015  CLINICAL DATA:  64 year old male presents with acute life-threatening upper GI bleed secondary to an ulcerated mass encompassing the distal stomach and duodenum. The patient's underlying etiology is favored to represent lymphoma which  should is currently undergoing confirmatory diagnosis. Endoscopic treatment options are limited and surgery is extremely high risk. Patient presents for emergent visceral arteriography and embolization. Additionally, central venous access is required. A triple-lumen central venous catheter will be placed during the course of the procedure. EXAM: SELECTIVE VISCERAL ARTERIOGRAPHY; IR EMBO ART VEN HEMORR LYMPH EXTRAV INC GUIDE ROADMAPPING; IR LEFT FLOURO GUIDE CV LINE; ADDITIONAL ARTERIOGRAPHY; IR ULTRASOUND GUIDANCE VASC ACCESS LEFT; ARTERIOGRAPHY Date: 01/23/2015 PROCEDURE: 1. Ultrasound-guided puncture left common femoral artery 2. Catheterization of the superior mesenteric artery with arteriogram 3. Catheterization of the celiac artery with arteriogram 4. Catheterization of the gastroduodenal artery with arteriogram 5. Catheterization of the right gastroepiploic artery with arteriogram 6. Coil embolization of the gastroduodenal artery 7. Post embolization arteriogram 8. Limited left common femoral arteriogram 9. Ultrasound-guided puncture of the left common femoral vein 10. Placement of a triple-lumen central venous catheter via the left femoral vein with fluoroscopic guidance Interventional Radiologist:  Criselda Peaches, MD ANESTHESIA/SEDATION: Moderate (conscious) sedation was used. 1 mg Versed, 50 mcg Fentanyl were administered intravenously. The patient's vital signs were monitored  continuously by radiology nursing throughout the procedure. Sedation Time: 60 minutes MEDICATIONS: None additional FLUOROSCOPY TIME:  16 minutes for a total of 622 mGy CONTRAST:  171m OMNIPAQUE IOHEXOL 300 MG/ML  SOLN TECHNIQUE: Informed consent was obtained from the patient following explanation of the procedure, risks, benefits and alternatives. The patient understands, agrees and consents for the procedure. All questions were addressed. A time out was performed. Maximal barrier sterile technique utilized including caps, mask, sterile gowns, sterile gloves, large sterile drape, hand hygiene, and Betadine skin prep. The left groin was interrogated with ultrasound. The common femoral artery is found to be widely patent. An image was obtained and stored for the medical record. Local anesthesia was attained by infiltration with 1% lidocaine. Under real-time sonographic guidance, the vessel was punctured with a 21 gauge micropuncture needle. Using standard technique, the initial micro wire was exchanged through a transitional 5 [place] sheath for a working 0.035 inch Bentson wire. The micro sheath was then exchanged for a working 5 [place] sheath. A C2 cobra catheter was advanced into the abdominal aorta over the wire. The catheter was initially advanced into the superior mesenteric artery and superior mesenteric arteriogram was performed. There is conventional anatomy. No replaced or accessory right hepatic artery. No evidence of active extravasation or significant collateralization through the pancreaticoduodenal arcade. The C2 cobra catheter was next advanced into the celiac artery. An arteriogram was performed. The gastroduodenal artery is elongated and slightly irregular in the mid segment likely secondary to external mass effect. There is no evidence of active extravasation. A renegade ST micro catheter was advanced coaxially over a fat and 16 wire. The micro catheter was advanced into the  gastroduodenal artery. Arteriography was again performed. There are multiple branches arising from the gastroduodenal artery providing supply to a hypervascular mass in the region the descending duodenum. The micro catheter was carefully advanced more distally into the proximal right gastroepiploic artery. Contrast injection was performed confirming the location of the catheter tip as well as confirming no additional supplied to the region of tumor. Coil embolization was then performed of the gastroduodenal artery using a combination of vortex, standard helical and soft interlock detachable micro coils ranging in size from 2-6 mm. Post embolization arteriography confirms successful embolization of the vessel with no further visible hypervascular blush of the duodenum tumor. The micro catheter was removed. Repeat celiac artery injection was  performed confirming no additional collateral supply. The 5 French catheter was readvanced of the superior mesenteric artery and an additional angiogram was performed again confirming no collateral flow to the embolized territory. The catheters were removed. The vascular sheath was left in place for hemodynamic monitoring. Ultrasound was again used to interrogate the left groin. The left common femoral vein was identified. Local anesthesia was attained by infiltration with 1% lidocaine. The left common femoral vein was punctured with an 18 gauge needle. A wire was advanced into the inferior vena cava. The skin tract was dilated and an arrow triple-lumen central venous catheter advanced over the wire and position with the tip at the confluence of the IVC and left common iliac vein. The catheter was flushed, capped and secured with 0 Prolene suture. Sterile bandages were applied. COMPLICATIONS: None IMPRESSION: 1. No evidence of active extravasation. 2. 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 3.  Coil embolization of the gastroduodenal artery. 4. Placement of a left femoral triple-lumen central venous catheter. The catheter tip is at the confluence of the IVC and iliac vein and ready for immediate use. Signed, Criselda Peaches, MD Vascular and Interventional Radiology Specialists [facility] Radiology Electronically Signed   By: Jacqulynn Cadet M.D.   On: 01/23/2015 15:50   Ir US Guide Vasc Access Left  01/23/2015  CLINICAL DATA:  64 year old male presents with acute life-threatening upper GI bleed secondary to an ulcerated mass encompassing the distal stomach and duodenum. 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 Endoscopic treatment options are limited and surgery is extremely high risk. Patient presents for emergent visceral arteriography and embolization. Additionally, central venous access is required. A triple-lumen central venous catheter will be placed during the course of the procedure. EXAM: SELECTIVE VISCERAL ARTERIOGRAPHY; IR EMBO ART VEN HEMORR LYMPH EXTRAV INC GUIDE ROADMAPPING; IR LEFT FLOURO GUIDE CV LINE; ADDITIONAL ARTERIOGRAPHY; IR ULTRASOUND GUIDANCE VASC ACCESS LEFT; ARTERIOGRAPHY Date: 01/23/2015 PROCEDURE: 1. Ultrasound-guided puncture left common femoral artery 2. Catheterization of the superior mesenteric artery with arteriogram 3. Catheterization of the celiac artery with arteriogram 4. Catheterization of the gastroduodenal artery with arteriogram 5. Catheterization of the right gastroepiploic artery with arteriogram 6. Coil embolization of the gastroduodenal artery 7. Post embolization arteriogram 8. Limited left common femoral arteriogram 9. Ultrasound-guided puncture of the left common femoral vein 10. Placement of a triple-lumen central venous catheter via the left femoral vein with fluoroscopic guidance Interventional Radiologist:  Criselda Peaches, MD ANESTHESIA/SEDATION: Moderate  (conscious) sedation was used. 1 mg Versed, 50 mcg Fentanyl were administered intravenously. The patient's vital signs were monitored continuously by radiology nursing throughout the procedure. Sedation Time: 60 minutes MEDICATIONS: None additional FLUOROSCOPY TIME:  16 minutes for a total of 622 mGy CONTRAST:  171m OMNIPAQUE IOHEXOL 300 MG/ML  SOLN TECHNIQUE: Informed consent was obtained from the patient following explanation of the procedure, risks, benefits and alternatives. The patient understands, agrees and consents for the procedure. All questions were addressed. A time out was performed. Maximal barrier sterile technique utilized including caps, mask, sterile gowns, sterile gloves, large sterile drape, hand hygiene, and Betadine skin prep. The left groin was interrogated with ultrasound. The common femoral artery is found to be widely patent. An image was obtained and stored for the medical record. Local anesthesia was attained by infiltration with 1% lidocaine. Under real-time sonographic guidance, the vessel was punctured with a 21 gauge  micropuncture needle. Using standard technique, the initial micro wire was exchanged through a transitional 5 [place] micro sheath for a working 0.035 inch Bentson wire. The micro sheath was then exchanged for a working 5 [place] vascular sheath. A C2 cobra catheter was advanced into the abdominal aorta over the wire. The catheter was initially advanced into the superior mesenteric artery and superior mesenteric arteriogram was performed. There is conventional anatomy. No replaced or accessory right hepatic artery. No evidence of active extravasation or significant collateralization through the pancreaticoduodenal arcade. The C2 cobra catheter was next advanced into the celiac artery. An arteriogram was performed. The gastroduodenal artery is elongated and slightly irregular in the mid segment likely secondary to external mass effect. There is no evidence of active  extravasation. A renegade ST micro catheter was advanced coaxially over a fat and 16 wire. The micro catheter was advanced into the gastroduodenal artery. Arteriography was again performed. There are multiple branches arising from the gastroduodenal artery providing supply to a hypervascular mass in the region the descending duodenum. The micro catheter was carefully advanced more distally into the proximal right gastroepiploic artery. Contrast injection was performed confirming the location of the catheter tip as well as confirming no additional supplied to the region of tumor. Coil embolization was then performed of the gastroduodenal artery using a combination of vortex, standard helical and soft interlock detachable micro coils ranging in size from 2-6 mm. Post embolization arteriography confirms successful embolization of the vessel with no further visible hypervascular blush of the duodenum tumor. The micro catheter was removed. Repeat celiac artery injection was performed confirming no additional collateral supply. The 5 French catheter was readvanced of the superior mesenteric artery and an additional angiogram was performed again confirming no collateral flow to the embolized territory. The catheters were removed. The vascular sheath was left in place for hemodynamic monitoring. Ultrasound was again used to interrogate the left groin. The left common femoral vein was identified. Local anesthesia was attained by infiltration with 1% lidocaine. The left common femoral vein was punctured with an 18 gauge needle. A wire was advanced into the inferior vena cava. The skin tract was dilated and an arrow triple-lumen central venous catheter advanced over the wire and position with the tip at the confluence of the IVC and left common iliac vein. The catheter was flushed, capped and secured with 0 Prolene suture. Sterile bandages were applied. COMPLICATIONS: None IMPRESSION: 1. No evidence of active extravasation. 2.  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 3. Coil embolization of the gastroduodenal artery. 4. Placement of a left femoral triple-lumen central venous catheter. The catheter tip is at the confluence of the IVC and iliac vein and ready for immediate use. Signed, Criselda Peaches, MD Vascular and Interventional Radiology Specialists [facility] Radiology Electronically Signed   By: Jacqulynn Cadet M.D.   On: 01/23/2015 15:50   Ir US Guide Vasc Access Left  01/23/2015  CLINICAL DATA:  64 year old male presents with acute life-threatening upper GI bleed secondary to an ulcerated mass encompassing the distal stomach and duodenum. 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 Endoscopic treatment options are limited and surgery is extremely high risk. Patient presents for emergent visceral arteriography and embolization. Additionally, central venous access is required. A triple-lumen central venous catheter will be placed during the course of the procedure. EXAM: SELECTIVE VISCERAL ARTERIOGRAPHY; IR EMBO ART VEN HEMORR  LYMPH EXTRAV INC GUIDE ROADMAPPING; IR LEFT FLOURO GUIDE CV LINE; ADDITIONAL ARTERIOGRAPHY; IR ULTRASOUND GUIDANCE VASC ACCESS LEFT; ARTERIOGRAPHY Date: 01/23/2015 PROCEDURE: 1. Ultrasound-guided puncture left common femoral artery 2. Catheterization of the superior mesenteric artery with arteriogram 3. Catheterization of the celiac artery with arteriogram 4. Catheterization of the gastroduodenal artery with arteriogram 5. Catheterization of the right gastroepiploic artery with arteriogram 6. Coil embolization of the gastroduodenal artery 7. Post embolization arteriogram 8. Limited left common femoral arteriogram 9. Ultrasound-guided puncture of the left common femoral vein 10. Placement of a triple-lumen central venous catheter via  the left femoral vein with fluoroscopic guidance Interventional Radiologist:  Criselda Peaches, MD ANESTHESIA/SEDATION: Moderate (conscious) sedation was used. 1 mg Versed, 50 mcg Fentanyl were administered intravenously. The patient's vital signs were monitored continuously by radiology nursing throughout the procedure. Sedation Time: 60 minutes MEDICATIONS: None additional FLUOROSCOPY TIME:  16 minutes for a total of 622 mGy CONTRAST:  170m OMNIPAQUE IOHEXOL 300 MG/ML  SOLN TECHNIQUE: Informed consent was obtained from the patient following explanation of the procedure, risks, benefits and alternatives. The patient understands, agrees and consents for the procedure. All questions were addressed. A time out was performed. Maximal barrier sterile technique utilized including caps, mask, sterile gowns, sterile gloves, large sterile drape, hand hygiene, and Betadine skin prep. The left groin was interrogated with ultrasound. The common femoral artery is found to be widely patent. An image was obtained and stored for the medical record. Local anesthesia was attained by infiltration with 1% lidocaine. Under real-time sonographic guidance, the vessel was punctured with a 21 gauge micropuncture needle. Using standard technique, the initial micro wire was exchanged through a transitional 5 [place] sheath for a working 0.035 inch Bentson wire. The micro sheath was then exchanged for a working 5 [place] sheath. A C2 cobra catheter was advanced into the abdominal aorta over the wire. The catheter was initially advanced into the superior mesenteric artery and superior mesenteric arteriogram was performed. There is conventional anatomy. No replaced or accessory right hepatic artery. No evidence of active extravasation or significant collateralization through the pancreaticoduodenal arcade. The C2 cobra catheter was next advanced into the celiac artery. An arteriogram was performed. The gastroduodenal artery is  elongated and slightly irregular in the mid segment likely secondary to external mass effect. There is no evidence of active extravasation. A renegade ST micro catheter was advanced coaxially over a fat and 16 wire. The micro catheter was advanced into the gastroduodenal artery. Arteriography was again performed. There are multiple branches arising from the gastroduodenal artery providing supply to a hypervascular mass in the region the descending duodenum. The micro catheter was carefully advanced more distally into the proximal right gastroepiploic artery. Contrast injection was performed confirming the location of the catheter tip as well as confirming no additional supplied to the region of tumor. Coil embolization was then performed of the gastroduodenal artery using a combination of vortex, standard helical and soft interlock detachable micro coils ranging in size from 2-6 mm. Post embolization arteriography confirms successful embolization of the vessel with no further visible hypervascular blush of the duodenum tumor. The micro catheter was removed. Repeat celiac artery injection was performed confirming no additional collateral supply. The 5 French catheter was readvanced of the superior mesenteric artery and an additional angiogram was performed again confirming no collateral flow to the embolized territory. The catheters were removed. The vascular sheath was left in place for hemodynamic monitoring. Ultrasound was again used to interrogate the left  groin. The left common femoral vein was identified. Local anesthesia was attained by infiltration with 1% lidocaine. The left common femoral vein was punctured with an 18 gauge needle. A wire was advanced into the inferior vena cava. The skin tract was dilated and an arrow triple-lumen central venous catheter advanced over the wire and position with the tip at the confluence of the IVC and left common iliac vein. The catheter was flushed, capped and secured  with 0 Prolene suture. Sterile bandages were applied. COMPLICATIONS: None IMPRESSION: 1. No evidence of active extravasation. 2. 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 3. Coil embolization of the gastroduodenal artery. 4. Placement of a left femoral triple-lumen central venous catheter. The catheter tip is at the confluence of the IVC and iliac vein and ready for immediate use. Signed, Criselda Peaches, MD [facility] Electronically Signed   By: Jacqulynn Cadet M.D.   On: 01/23/2015 15:50   Portable Chest Xray  01/24/2015  CLINICAL DATA:  Respiratory failure. EXAM: PORTABLE CHEST 1 VIEW COMPARISON:  01/23/2015 FINDINGS: Endotracheal tube and NG tube in stable position. Cardiomegaly with normal pulmonary vascularity.Persistent increased density noted over the right Lower lung, most likely layering pleural effusion. Underlying pulmonary infiltrate cannot be excluded. Small left pleural effusion cannot be excluded. Low lung volumes basilar atelectasis. No pneumothorax. IMPRESSION: 1. Lines and tubes in stable position. 2. Persistent increased density of the right lower lung, most likely layering pleural effusion. Underlying infiltrate cannot be excluded. 3. Low lung volumes with basilar atelectasis. Electronically Signed   By: Marcello Moores  Register   On: 01/24/2015 07:39   [place]  01/23/2015  CLINICAL DATA:  64 year old male presents with acute life-threatening upper GI bleed secondary to an ulcerated mass encompassing the distal stomach and duodenum. 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 Endoscopic treatment options are limited and surgery is extremely high risk. Patient presents for emergent visceral arteriography and  embolization. Additionally, central venous access is required. A triple-lumen central venous catheter will be placed during the course of the procedure. EXAM: SELECTIVE VISCERAL ARTERIOGRAPHY; IR EMBO ART VEN HEMORR LYMPH EXTRAV INC GUIDE ROADMAPPING; IR LEFT FLOURO GUIDE CV LINE; ADDITIONAL ARTERIOGRAPHY; IR ULTRASOUND GUIDANCE VASC ACCESS LEFT; ARTERIOGRAPHY Date: 01/23/2015 PROCEDURE: 1. Ultrasound-guided puncture left common femoral artery 2. Catheterization of the superior mesenteric artery with arteriogram 3. Catheterization of the celiac artery with arteriogram 4. Catheterization of the gastroduodenal artery with arteriogram 5. Catheterization of the right gastroepiploic artery with arteriogram 6. Coil embolization of the gastroduodenal artery 7. Post embolization arteriogram 8. Limited left common femoral arteriogram 9. Ultrasound-guided puncture of the left common femoral vein 10. Placement of a triple-lumen central venous catheter via the left femoral vein with fluoroscopic guidance Interventional Radiologist:  Criselda Peaches, MD ANESTHESIA/SEDATION: Moderate (conscious) sedation was used. 1 mg Versed, 50 mcg Fentanyl were administered intravenously. The patient's vital signs were monitored continuously by radiology nursing throughout the procedure. Sedation Time: 60 minutes MEDICATIONS: None additional FLUOROSCOPY TIME:  16 minutes for a total of 622 mGy CONTRAST:  153m OMNIPAQUE IOHEXOL 300 MG/ML  SOLN TECHNIQUE: Informed consent was obtained from the patient following explanation of the procedure, risks, benefits and alternatives. The patient understands, agrees and consents for the procedure. All questions were addressed. A time out was performed. Maximal barrier  sterile technique utilized including caps, mask, sterile gowns, sterile gloves, large sterile drape, hand hygiene, and Betadine skin prep. The left groin was interrogated with ultrasound. The common femoral artery is found to be widely  patent. An image was obtained and stored for the medical record. Local anesthesia was attained by infiltration with 1% lidocaine. Under real-time sonographic guidance, the vessel was punctured with a 21 gauge micropuncture needle. Using standard technique, the initial micro wire was exchanged through a transitional 5 [place] micro sheath for a working 0.035 inch Bentson wire. The micro sheath was then exchanged for a working 5 [place] vascular sheath. A C2 cobra catheter was advanced into the abdominal aorta over the wire. The catheter was initially advanced into the superior mesenteric artery and superior mesenteric arteriogram was performed. There is conventional anatomy. No replaced or accessory right hepatic artery. No evidence of active extravasation or significant collateralization through the pancreaticoduodenal arcade. The C2 cobra catheter was next advanced into the celiac artery. An arteriogram was performed. The gastroduodenal artery is elongated and slightly irregular in the mid segment likely secondary to external mass effect. There is no evidence of active extravasation. A renegade ST micro catheter was advanced coaxially over a fat and 16 wire. The micro catheter was advanced into the gastroduodenal artery. Arteriography was again performed. There are multiple branches arising from the gastroduodenal artery providing supply to a hypervascular mass in the region the descending duodenum. The micro catheter was carefully advanced more distally into the proximal right gastroepiploic artery. Contrast injection was performed confirming the location of the catheter tip as well as confirming no additional supplied to the region of tumor. Coil embolization was then performed of the gastroduodenal artery using a combination of vortex, standard helical and soft interlock detachable micro coils ranging in size from 2-6 mm. Post embolization arteriography confirms successful embolization of the vessel with no  further visible hypervascular blush of the duodenum tumor. The micro catheter was removed. Repeat celiac artery injection was performed confirming no additional collateral supply. The 5 French catheter was readvanced of the superior mesenteric artery and an additional angiogram was performed again confirming no collateral flow to the embolized territory. The catheters were removed. The vascular sheath was left in place for hemodynamic monitoring. Ultrasound was again used to interrogate the left groin. The left common femoral vein was identified. Local anesthesia was attained by infiltration with 1% lidocaine. The left common femoral vein was punctured with an 18 gauge needle. A wire was advanced into the inferior vena cava. The skin tract was dilated and an arrow triple-lumen central venous catheter advanced over the wire and position with the tip at the confluence of the IVC and left common iliac vein. The catheter was flushed, capped and secured with 0 Prolene suture. Sterile bandages were applied. COMPLICATIONS: None IMPRESSION: 1. No evidence of active extravasation. 2. 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 3. Coil embolization of the gastroduodenal artery. 4. Placement of a left femoral triple-lumen central venous catheter. The catheter tip is at the confluence of the IVC and iliac vein and ready for immediate use. Signed, Criselda Peaches, MD [facility] Radiology Electronically Signed   By: Jacqulynn Cadet M.D.   On: 01/23/2015 15:50    Scheduled Meds: . sodium chloride   Intravenous Once  . piperacillin-tazobactam (ZOSYN)  IV  3.375 g Intravenous 3 times per day  . sucralfate  1  g Oral TID WC & HS   Continuous Infusions: . sodium chloride 75 mL/hr at 01/25/15 0855  . 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 If 7PM-7AM, please contact night-coverage at [email or web address], password [facility] 01/25/2015, 10:31 AM  LOS: 2 days

## 2015-01-25 NOTE — Telephone Encounter (Signed)
Patient rescheduled for 01/06 @ 8:45  Labs, 9 MD.

## 2015-01-25 NOTE — Progress Notes (Signed)
Changed visit date to [address]; enetered chemo orders

## 2015-01-26 ENCOUNTER — Telehealth: Payer: Self-pay | Admitting: Oncology

## 2015-01-26 ENCOUNTER — Ambulatory Visit (HOSPITAL_BASED_OUTPATIENT_CLINIC_OR_DEPARTMENT_OTHER): Payer: BLUE CROSS/BLUE SHIELD | Admitting: Oncology

## 2015-01-26 ENCOUNTER — Other Ambulatory Visit: Payer: Self-pay | Admitting: Oncology

## 2015-01-26 ENCOUNTER — Inpatient Hospital Stay (HOSPITAL_COMMUNITY): Payer: BLUE CROSS/BLUE SHIELD

## 2015-01-26 DIAGNOSIS — C8319 Mantle cell lymphoma, extranodal and solid organ sites: Secondary | ICD-10-CM

## 2015-01-26 HISTORY — PX: PORTACATH PLACEMENT: SHX2246

## 2015-01-26 LAB — CBC WITH DIFFERENTIAL/PLATELET
Basophils Absolute: 0 10*3/uL (ref 0.0–0.1)
Basophils Relative: 0 %
Eosinophils Absolute: 0.2 10*3/uL (ref 0.0–0.7)
Eosinophils Relative: 2 %
HEMATOCRIT: 20 % — AB (ref 39.0–52.0)
Hemoglobin: 6.6 g/dL — CL (ref 13.0–17.0)
LYMPHS ABS: 1 10*3/uL (ref 0.7–4.0)
LYMPHS PCT: 11 %
MCH: 28.1 pg (ref 26.0–34.0)
MCHC: 33 g/dL (ref 30.0–36.0)
MCV: 85.1 fL (ref 78.0–100.0)
MONO ABS: 0.5 10*3/uL (ref 0.1–1.0)
MONOS PCT: 6 %
NEUTROS ABS: 7.2 10*3/uL (ref 1.7–7.7)
Neutrophils Relative %: 82 %
Platelets: 228 10*3/uL (ref 150–400)
RBC: 2.35 MIL/uL — ABNORMAL LOW (ref 4.22–5.81)
RDW: 16.4 % — AB (ref 11.5–15.5)
WBC: 8.9 10*3/uL (ref 4.0–10.5)

## 2015-01-26 LAB — GLUCOSE, CAPILLARY
GLUCOSE-CAPILLARY: 92 mg/dL (ref 65–99)
GLUCOSE-CAPILLARY: 71 mg/dL (ref 65–99)
Glucose-Capillary: 104 mg/dL — ABNORMAL HIGH (ref 65–99)
Glucose-Capillary: 72 mg/dL (ref 65–99)
Glucose-Capillary: 82 mg/dL (ref 65–99)
Glucose-Capillary: 90 mg/dL (ref 65–99)

## 2015-01-26 LAB — CBC
HCT: 27.3 % — ABNORMAL LOW (ref 39.0–52.0)
Hemoglobin: 9.2 g/dL — ABNORMAL LOW (ref 13.0–17.0)
MCH: 28.6 pg (ref 26.0–34.0)
MCHC: 33.7 g/dL (ref 30.0–36.0)
MCV: 84.8 fL (ref 78.0–100.0)
PLATELETS: 274 10*3/uL (ref 150–400)
RBC: 3.22 MIL/uL — ABNORMAL LOW (ref 4.22–5.81)
RDW: 15.5 % (ref 11.5–15.5)
WBC: 10.3 10*3/uL (ref 4.0–10.5)

## 2015-01-26 LAB — BASIC METABOLIC PANEL
Anion gap: 6 (ref 5–15)
BUN: 10 mg/dL (ref 6–20)
CALCIUM: 7.7 mg/dL — AB (ref 8.9–10.3)
CO2: 24 mmol/L (ref 22–32)
CREATININE: 0.99 mg/dL (ref 0.61–1.24)
Chloride: 109 mmol/L (ref 101–111)
GFR calc Af Amer: >60 mL/min (ref >60)
GFR calc non Af Amer: >60 mL/min (ref >60)
GLUCOSE: 103 mg/dL — AB (ref 65–99)
Potassium: 3.9 mmol/L (ref 3.5–5.1)
Sodium: 139 mmol/L (ref 135–145)

## 2015-01-26 LAB — URINE CULTURE: Culture: NO GROWTH

## 2015-01-26 LAB — MAGNESIUM: Magnesium: 1.8 mg/dL (ref 1.7–2.4)

## 2015-01-26 LAB — PREPARE RBC (CROSSMATCH)

## 2015-01-26 LAB — PHOSPHORUS: Phosphorus: 2.5 mg/dL (ref 2.5–4.6)

## 2015-01-26 IMAGING — US IR US GUIDE VASC ACCESS RIGHT
1 series · 2 of 2 positions shown · non-contrast
Comparison: none

CLINICAL DATA: MANTLE CELL LYMPHOMA, ACCESS FOR CHEMOTHERAPY

[Series 1: ir fluoro/shunt/fist · 2 of 2 slices shown]
[im 1/2]
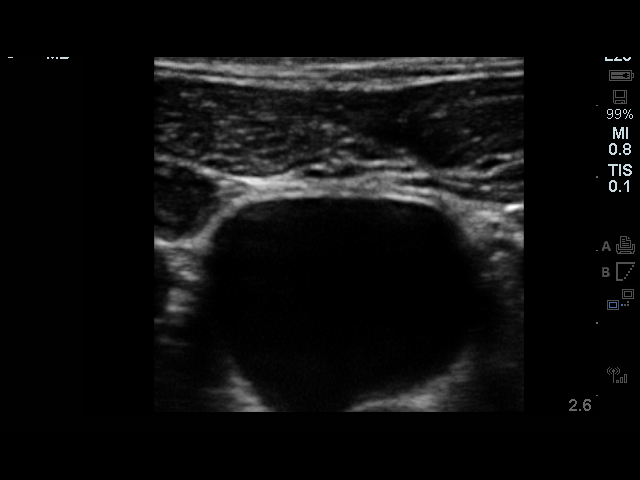
[im 2/2]
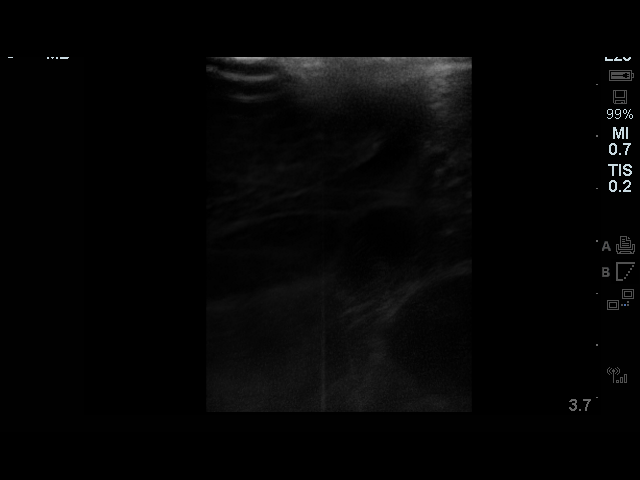

[2 of 2 positions shown; findings below may reference images not displayed]

EXAM:
RIGHT INTERNAL JUGULAR SINGLE LUMEN POWER PORT CATHETER INSERTION

Radiologist:  DEEQA RAYAAN

Guidance:  Ultrasound and fluoroscopic

FLUOROSCOPY TIME:  48 seconds, for mGy

MEDICATIONS AND MEDICAL HISTORY:
2 g AncefAdministered within 1 hour of the procedure.2 mg Versed,
100 mcg fentanyl

ANESTHESIA/SEDATION:
30 minutes

CONTRAST:  None

COMPLICATIONS:
None immediate

PROCEDURE:
Informed consent was obtained from the patient following explanation
of the procedure, risks, benefits and alternatives. The patient
understands, agrees and consents for the procedure. All questions
were addressed. A time out was performed.

Maximal barrier sterile technique utilized including caps, mask,
sterile gowns, sterile gloves, large sterile drape, hand hygiene,
and 2% chlorhexidine scrub.

Under sterile conditions and local anesthesia, right internal
jugular micropuncture venous access was performed. Access was
performed with ultrasound. Images were obtained for documentation. A
guide wire was inserted followed by a transitional dilator. This
allowed insertion of a guide wire and catheter into the IVC.
Measurements were obtained from the SVC / RA junction back to the
right IJ venotomy site. In the right infraclavicular chest, a
subcutaneous pocket was created over the second anterior rib. This
was done under sterile conditions and local anesthesia. 1% lidocaine
with epinephrine was utilized for this. A 2.5 cm incision was made
in the skin. Blunt dissection was performed to create a subcutaneous
pocket over the right pectoralis major muscle. The pocket was
flushed with saline vigorously. There was adequate hemostasis. The
port catheter was assembled and checked for leakage. The port
catheter was secured in the pocket with two retention sutures. The
tubing was tunneled subcutaneously to the right venotomy site and
inserted into the SVC/RA junction through a valved peel-away sheath.
Position was confirmed with fluoroscopy. Images were obtained for
documentation. The patient tolerated the procedure well. No
immediate complications. Incisions were closed in a two layer
fashion with 4 - 0 Vicryl suture. Dermabond was applied to the skin.
The port catheter was accessed, blood was aspirated followed by
saline and heparin flushes. Needle was removed. A dry sterile
dressing was applied.
IMPRESSION: Ultrasound and fluoroscopically guided right internal jugular single
lumen power port catheter insertion. Tip in the SVC/RA junction.
Catheter ready for use.

## 2015-01-26 MED ORDER — MIDAZOLAM HCL 2 MG/2ML IJ SOLN
INTRAMUSCULAR | Status: AC
  Filled 2015-01-26: qty 2

## 2015-01-26 MED ORDER — MIDAZOLAM HCL 2 MG/2ML IJ SOLN
INTRAMUSCULAR | Status: AC | PRN
  Administered 2015-01-26: 2 mg via INTRAVENOUS

## 2015-01-26 MED ORDER — FUROSEMIDE 10 MG/ML IJ SOLN
20.0000 mg | Freq: Once | INTRAMUSCULAR | Status: AC
  Administered 2015-01-26: 20 mg via INTRAVENOUS
  Filled 2015-01-26: qty 2

## 2015-01-26 MED ORDER — ACETAMINOPHEN 325 MG PO TABS
650.0000 mg | ORAL_TABLET | Freq: Once | ORAL | Status: AC
  Administered 2015-01-26: 650 mg via ORAL
  Filled 2015-01-26: qty 2

## 2015-01-26 MED ORDER — FENTANYL CITRATE (PF) 100 MCG/2ML IJ SOLN
INTRAMUSCULAR | Status: AC
  Filled 2015-01-26: qty 2

## 2015-01-26 MED ORDER — ACETAMINOPHEN 500 MG PO TABS: 500.0000 mg | ORAL_TABLET | Freq: Four times a day (QID) | ORAL | Status: DC | PRN

## 2015-01-26 MED ORDER — LIDOCAINE-EPINEPHRINE (PF) 1 %-1:200000 IJ SOLN
INTRAMUSCULAR | Status: AC
  Filled 2015-01-26: qty 30

## 2015-01-26 MED ORDER — TRAMADOL HCL 50 MG PO TABS: 50.0000 mg | ORAL_TABLET | Freq: Four times a day (QID) | ORAL | Status: DC | PRN

## 2015-01-26 MED ORDER — FENTANYL CITRATE (PF) 100 MCG/2ML IJ SOLN
INTRAMUSCULAR | Status: AC | PRN
  Administered 2015-01-26 (×2): 50 ug via INTRAVENOUS

## 2015-01-26 MED ORDER — HEPARIN SOD (PORK) LOCK FLUSH 100 UNIT/ML IV SOLN
INTRAVENOUS | Status: AC
  Filled 2015-01-26: qty 5

## 2015-01-26 MED ORDER — PANTOPRAZOLE SODIUM 40 MG PO TBEC
40.0000 mg | DELAYED_RELEASE_TABLET | Freq: Two times a day (BID) | ORAL | Status: DC
  Administered 2015-01-26 – 2015-01-27 (×3): 40 mg via ORAL
  Filled 2015-01-26 (×3): qty 1

## 2015-01-26 MED ORDER — CEFAZOLIN SODIUM-DEXTROSE 2-3 GM-% IV SOLR
INTRAVENOUS | Status: AC
  Administered 2015-01-26: 2000 mg
  Filled 2015-01-26: qty 50

## 2015-01-26 MED ORDER — SODIUM CHLORIDE 0.9 % IV SOLN: Freq: Once | INTRAVENOUS | Status: DC

## 2015-01-26 MED ORDER — HEPARIN SODIUM (PORCINE) 1000 UNIT/ML IJ SOLN
INTRAMUSCULAR | Status: AC | PRN
  Administered 2015-01-26: 500 [IU] via INTRAVENOUS

## 2015-01-26 NOTE — Progress Notes (Signed)
Pharmacy Antibiotic Follow-up Note [name] is a 64 y.o. year-old male admitted on 01/23/2015. The patient is currently on day 4 of Zosyn for possible aspiration PNA in setting of large duodenal bleeding ulcer (s/p embolization) and new dx of mantle cell lymphoma.  Assessment/Plan: 1. Remains on EI Zosyn 3.375 gram IV q8H for possible asp pna; deescalate as feasible  Temp (24hrs), Avg:98 F (36.7 C), Min:97.7 F (36.5 C), Max:98.4 F (36.9 C)   Recent Labs Lab 01/23/15 2230 01/24/15 0430 01/25/15 0355 01/25/15 0830 01/26/15 0415  WBC 17.0* 14.5* 10.5 11.1* 8.9    Recent Labs Lab 01/23/15 0751 01/23/15 0812 01/24/15 0430 01/25/15 0355 01/26/15 0415  CREATININE 1.71* 1.50* 1.65* 1.21 0.99   Estimated Creatinine Clearance: 90.2 mL/min (by C-G formula based on Cr of 0.99).    No Known Allergies  Antimicrobials this admission: 12/20 Zosyn >>   Levels/dose changes this admission: None to date  Microbiology results: 12/21 UCx: ngF 12/20 BCx: ngtd 12/20 UCx: ngF   Thank you for allowing pharmacy to be a part of this patient's care.  Vincenza Hews, PharmD, BCPS 01/26/2015, 2:46 PM Pager: [phone]

## 2015-01-26 NOTE — Progress Notes (Signed)
[facility] TEAM 1 - Stepdown/ICU TEAM PROGRESS NOTE  CALEN GEISTER UVO:[id number] DOB: [birth year] DOA: 01/23/2015 PCP: No primary care provider on file.  Admit HPI / Brief Narrative: 64 yo male with a h/o HTN, multiple syncopal episodes, and HLD who presented to the ED via EMS following a syncopal episode with severe SOB at home. Wife indicated patient had an uneventful night after his lymph node biopsy that was done on 12/19. Per patient's wife, patient complained of abdominal fullness and nausea. He went to the bathroom, felt extremely dizzy, and fell on the floor. She assisted him out of the bathroom. EMS was called and upon EMS arrival, patient was very SOB and combative. He was placed on a non-rebreather mask and transferred to the ED.   At the ED, patient decompensated requiring intubation. His NGT was draining bright red blood. 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 He had an EGD on 12/15 and a CT abdomen on 12/14 that showed a massive duodenal bulb ulcer with adherent clot, an ulcerated pyloric channel and lymphadenopathy with great suspicion for lymphoma.   HPI/Subjective: Pt is in good spirits up in chair.  Reports a tan colored bowel movement this morning.  Denies cp, sob, n/v, or abdom pain.   Assessment/Plan:  Syncope due to hemorrhagic shock / acute blood loss anemia / large duodenal ulcer due to ulcerated lymphoma mass S/p embolization of gastroduodenal artery 01/23/2015 - Hemoglobin had been stable at 7.1 but fell to 6.6 this morning - no other signs of ongoing bleeding at this time - felt to be high risk for rebleed - transfused another 2U PRBC today - check Hgb tonight and again in AM - advance to full liquid diet   Mantle cell lymphoma EGD 12/15 and R inguinal lymph node bx 12/19 during prior admit - followed by Oncology who is recommending chemotherapy which may be done in the outpatient setting - TTE  and bone marrow biopsy have been ordered   acute renal failure secondary to prerenal azotemia - resolved w/ transfusion/volume expansion   hypertension Follow w/o change in tx today   Code Status: FULL Family Communication: no family present at time of exam Disposition Plan: SDU - possible d/c next 24-48hrs if hgb stable/no recurrent bleeding w/ advancing of diet   Consultants: PCCM Gen Surgery [facility] - [facility]  IR Heme/Onc  Procedures: Coil embolization of gastroduodenal artery per Dr. Geroge Baseman 01/23/2015 ETT 01/23/2015 > 01/24/2015 TTE 12/22 >  Antibiotics: Zosyn 12/20 > 12/23  DVT prophylaxis: SCDs  Objective: Blood pressure 141/84, pulse 70, temperature 97.8 F (36.6 C), temperature source Oral, resp. rate 22, height '5\' 10"'$  (1.778 m), weight 96.5 kg (212 lb 11.9 oz), SpO2 100 %.  Intake/Output Summary (Last 24 hours) at 01/26/15 1500 Last data filed at 01/26/15 1334  Gross per 24 hour  Intake 3651.25 ml  Output   2950 ml  Net 701.25 ml   Exam: General: No acute respiratory distress Lungs: Clear to auscultation bilaterally without wheezes or crackles Cardiovascular: Regular rate and rhythm without murmur gallop or rub normal S1 and S2 Abdomen: Nontender, nondistended, soft, bowel sounds positive, no rebound, no ascites, no appreciable mass Extremities: No significant cyanosis, clubbing, or edema bilateral lower extremities  Data Reviewed: Basic Metabolic Panel:  Recent Labs Lab 01/22/15 0400 01/23/15 0751 01/23/15 0812 01/24/15 0430 01/25/15 0355 01/26/15 0415  NA 138 147* 142 141 141 139  K 3.8 5.7*  5.5* 4.3 4.1 3.9  CL 106 111 113* 114* 113* 109  CO2 25 8*  --  21* 23 24  GLUCOSE 98 359* 328* 142* 112* 103*  BUN <5* 11 13 21* 19 10  CREATININE 0.94 1.71* 1.50* 1.65* 1.21 0.99  CALCIUM 8.1* 9.2  --  6.7* 7.4* 7.7*  MG  --   --   --  1.8 2.1 1.8  PHOS  --   --   --  4.6 3.0 2.5    CBC:  Recent Labs Lab 01/23/15 2230 01/24/15 0430  01/25/15 0355 01/25/15 0830 01/25/15 1615 01/26/15 0415  WBC 17.0* 14.5* 10.5 11.1*  --  8.9  NEUTROABS  --   --  8.9*  --   --  7.2  HGB 8.0* 7.5* 7.1* 7.1* 7.2* 6.6*  HCT 23.7* 22.1* 20.9* 21.8* 21.8* 20.0*  MCV 84.0 84.0 85.7 86.2  --  85.1  PLT 216 207 200 231  --  228    Liver Function Tests:  Recent Labs Lab 01/21/15 0422 01/23/15 0751 01/24/15 0430  AST 23 44* 39  ALT 50 37 37  ALKPHOS 64 67 61  BILITOT 0.5 0.2* 0.5  PROT 5.3* 4.5* 4.1*  ALBUMIN 2.5* 2.3* 2.0*    Recent Labs Lab 01/23/15 0800  AMMONIA 336*    Coags:  Recent Labs Lab 01/23/15 0751 01/25/15 0355  INR 1.53* 1.24   CBG:  Recent Labs Lab 01/25/15 1943 01/25/15 2335 01/26/15 0349 01/26/15 0751 01/26/15 1207  GLUCAP 139* 82 92 90 71    Recent Results (from the past 240 hour(s))  MRSA PCR Screening     Status: None   Collection Time: 01/17/15  9:07 PM  Result Value Ref Range Status   MRSA by PCR NEGATIVE NEGATIVE Final    Comment:        The GeneXpert MRSA Assay (FDA approved for NASAL specimens only), is one component of a comprehensive MRSA colonization surveillance program. It is not intended to diagnose MRSA infection nor to guide or monitor treatment for MRSA infections.   Surgical pcr screen     Status: Abnormal   Collection Time: 01/22/15  3:10 AM  Result Value Ref Range Status   MRSA, PCR NEGATIVE NEGATIVE Final   Staphylococcus aureus POSITIVE (A) NEGATIVE Final    Comment:        The Xpert SA Assay (FDA approved for NASAL specimens in patients over 82 years of age), is one component of a comprehensive surveillance program.  Test performance has been validated by [facility] for patients greater than or equal to 65 year old. It is not intended to diagnose infection nor to guide or monitor treatment.   Culture, blood (routine x 2)     Status: None (Preliminary result)   Collection Time: 01/23/15  8:00 AM  Result Value Ref Range Status   Specimen  Description BLOOD RIGHT ANTECUBITAL  Final   Special Requests BOTTLES DRAWN AEROBIC AND ANAEROBIC  Final   Culture NO GROWTH 3 DAYS  Final   Report Status PENDING  Incomplete  Urine culture     Status: None   Collection Time: 01/23/15  8:05 AM  Result Value Ref Range Status   Specimen Description URINE, CATHETERIZED  Final   Special Requests Normal  Final   Culture NO GROWTH 1 DAY  Final   Report Status 01/24/2015 FINAL  Final  Culture, blood (routine x 2)     Status: None (Preliminary result)   Collection  Time: 01/23/15  4:33 PM  Result Value Ref Range Status   Specimen Description BLOOD RIGHT HAND  Final   Special Requests BOTTLES DRAWN AEROBIC ONLY 4CC  Final   Culture NO GROWTH 3 DAYS  Final   Report Status PENDING  Incomplete  Urine culture     Status: None   Collection Time: 01/24/15  9:00 PM  Result Value Ref Range Status   Specimen Description URINE, CLEAN CATCH  Final   Special Requests NONE  Final   Culture NO GROWTH 2 DAYS  Final   Report Status 01/26/2015 FINAL  Final     Studies:   Recent x-ray studies have been reviewed in detail by the Attending Physician  Scheduled Meds:  Scheduled Meds: . sodium chloride   Intravenous Once  . sodium chloride   Intravenous Once  . fentaNYL      . heparin lock flush      . lidocaine-EPINEPHrine      . midazolam      . pantoprazole  40 mg Oral BID AC  . piperacillin-tazobactam (ZOSYN)  IV  3.375 g Intravenous 3 times per day  . sucralfate  1 g Oral TID WC & HS    Time spent on care of this patient: 35 mins   MCCLUNG,JEFFREY T , MD   [facility] Office  [phone] Pager - Text Page per Shea Evans as per below:  On-Call/Text Page:      [email or web address]      password [email or web address]  If 7PM-7AM, please contact night-coverage [email or web address] Password [email or web address] 01/26/2015, 3:00 PM   LOS: 3 days

## 2015-01-26 NOTE — Progress Notes (Signed)
[facility] PROGRESS NOTE Subjective No gross bleeding Hg drifted down  For BM  today  Objective: Vital signs in last 24 hours: Temp:  [97.7 F (36.5 C)-98.4 F (36.9 C)] 97.8 F (36.6 C) (12/23 0812) Pulse Rate:  [65-134] 85 (12/23 0812) Resp:  [18-26] 18 (12/23 0812) BP: (138-158)/(82-91) 156/90 mmHg (12/23 0812) SpO2:  [84 %-100 %] 97 % (12/23 0812) Weight:  [96.5 kg (212 lb 11.9 oz)] 96.5 kg (212 lb 11.9 oz) (12/23 0354) Last BM Date: 01/24/15  Intake/Output from previous day: 12/22 0701 - 12/23 0700 In: 2890.8 [P.O.:680; I.V.:2060.8; IV Piggyback:150] Out: 2250 [Urine:2200; Emesis/NG output:50] Intake/Output this shift: Total I/O In: 306 [Blood:306] Out: -   PE: General--NAD  Abdomen--soft and nontender,   Lab Results:  Recent Labs  01/23/15 2230 01/24/15 0430 01/25/15 0355 01/25/15 0830 01/25/15 1615 01/26/15 0415  WBC 17.0* 14.5* 10.5 11.1*  --  8.9  HGB 8.0* 7.5* 7.1* 7.1* 7.2* 6.6*  HCT 23.7* 22.1* 20.9* 21.8* 21.8* 20.0*  PLT 216 207 200 231  --  228   BMET  Recent Labs  01/24/15 0430 01/25/15 0355 01/26/15 0415  NA 141 141 139  K 4.3 4.1 3.9  CL 114* 113* 109  CO2 21* 23 24  CREATININE 1.65* 1.21 0.99   LFT  Recent Labs  01/24/15 0430  PROT 4.1*  AST 39  ALT 37  ALKPHOS 61  BILITOT 0.5   PT/INR  Recent Labs  01/25/15 0355  LABPROT 15.8*  INR 1.24   PANCREAS No results for input(s): LIPASE in the last 72 hours.       Studies/Results: No results found.  Medications: I have reviewed the patient's current medications.  Assessment/Plan: 1. Ulcerated Gastric lymphoma. Bleeding appears controlled Will remove the NG, would advance to full liquids after the BM test today. Would continue the carafate and change PPI to PO   [name],[name] 01/26/2015, 8:37 AM  Pager: [phone] If no answer or after hours call [phone]

## 2015-01-26 NOTE — Procedures (Signed)
Successful RT IJ POWER PORT TIP SVC/RA NO COMP READY FOR USE FULL REPORT IN PACS  

## 2015-01-26 NOTE — Telephone Encounter (Signed)
Called and left a message with new appointment times

## 2015-01-26 NOTE — Progress Notes (Signed)
HANISH LARAIA   DOB:May 13, 1950   CM#:[id number]   AVW#:[id number]  Subjective: sitting up on side of bed, feeling like going for a run, he says, hates the ng, stomach has been "rumbling," had a small BM. No family in room   Objective: middle aged African American man examined at bedside Filed Vitals:   01/26/15 0644 01/26/15 0739  BP: 140/88 158/91  Pulse: 72 65  Temp: 97.8 F (36.6 C) 97.7 F (36.5 C)  Resp: 22 23    Body mass index is 30.53 kg/(m^2).  Intake/Output Summary (Last 24 hours) at 01/26/15 0749 Last data filed at 01/26/15 5374  Gross per 24 hour  Intake 2890.84 ml  Output   2250 ml  Net 640.84 ml   Lungs: clear, auscultated anterolaterally Heart RRR Ng in place  . CBG (last 3)   Recent Labs  01/25/15 1943 01/25/15 2335 01/26/15 0349  GLUCAP 139* 82 92     Labs:  Lab Results  Component Value Date   WBC 8.9 01/26/2015   HGB 6.6* 01/26/2015   HCT 20.0* 01/26/2015   MCV 85.1 01/26/2015   PLT 228 01/26/2015   NEUTROABS 7.2 01/26/2015    _0 @  Urine Studies No results for input(s): UHGB, CRYS in the last 72 hours.  Invalid input(s): UACOL, UAPR, USPG, UPH, UTP, UGL, UKET, UBIL, UNIT, UROB, [place], UEPI, UWBC, [place], [place], [place], [place], [place]  Basic Metabolic Panel:  Recent Labs Lab 01/22/15 0400 01/23/15 0751 01/23/15 0812 01/24/15 0430 01/25/15 0355 01/26/15 0415  NA 138 147* 142 141 141 139  K 3.8 5.7* 5.5* 4.3 4.1 3.9  CL 106 111 113* 114* 113* 109  CO2 25 8*  --  21* 23 24  GLUCOSE 98 359* 328* 142* 112* 103*  BUN <5* 11 13 21* 19 10  CREATININE 0.94 1.71* 1.50* 1.65* 1.21 0.99  CALCIUM 8.1* 9.2  --  6.7* 7.4* 7.7*  MG  --   --   --  1.8 2.1 1.8  PHOS  --   --   --  4.6 3.0 2.5   GFR Estimated Creatinine Clearance: 90.2 mL/min (by C-G formula based on Cr of 0.99). Liver Function Tests:  Recent Labs Lab 01/21/15 0422 01/23/15 0751 01/24/15 0430  AST 23 44* 39  ALT 50 37 37  ALKPHOS 64 67 61  BILITOT 0.5 0.2* 0.5   PROT 5.3* 4.5* 4.1*  ALBUMIN 2.5* 2.3* 2.0*   No results for input(s): LIPASE, AMYLASE in the last 168 hours.  Recent Labs Lab 01/23/15 0800  AMMONIA 336*   Coagulation profile  Recent Labs Lab 01/23/15 0751 01/25/15 0355  INR 1.53* 1.24    CBC:  Recent Labs Lab 01/23/15 2230 01/24/15 0430 01/25/15 0355 01/25/15 0830 01/25/15 1615 01/26/15 0415  WBC 17.0* 14.5* 10.5 11.1*  --  8.9  NEUTROABS  --   --  8.9*  --   --  7.2  HGB 8.0* 7.5* 7.1* 7.1* 7.2* 6.6*  HCT 23.7* 22.1* 20.9* 21.8* 21.8* 20.0*  MCV 84.0 84.0 85.7 86.2  --  85.1  PLT 216 207 200 231  --  228   Cardiac Enzymes: No results for input(s): CKTOTAL, CKMB, CKMBINDEX, TROPONINI in the last 168 hours. BNP: Invalid input(s): POCBNP CBG:  Recent Labs Lab 01/25/15 1230 01/25/15 1517 01/25/15 1943 01/25/15 2335 01/26/15 0349  GLUCAP 87 84 139* 82 92   D-Dimer No results for input(s): DDIMER in the last 72 hours. Hgb A1c No results for input(s): HGBA1C in  the last 72 hours. Lipid Profile  Recent Labs  01/23/15 1400  TRIG 107   Thyroid function studies No results for input(s): TSH, T4TOTAL, T3FREE, THYROIDAB in the last 72 hours.  Invalid input(s): FREET3 Anemia work up No results for input(s): VITAMINB12, FOLATE, FERRITIN, TIBC, IRON, RETICCTPCT in the last 72 hours. Microbiology Recent Results (from the past 240 hour(s))  MRSA PCR Screening     Status: None   Collection Time: 01/17/15  9:07 PM  Result Value Ref Range Status   MRSA by PCR NEGATIVE NEGATIVE Final    Comment:        The GeneXpert MRSA Assay (FDA approved for NASAL specimens only), is one component of a comprehensive MRSA colonization surveillance program. It is not intended to diagnose MRSA infection nor to guide or monitor treatment for MRSA infections.   Surgical pcr screen     Status: Abnormal   Collection Time: 01/22/15  3:10 AM  Result Value Ref Range Status   MRSA, PCR NEGATIVE NEGATIVE Final    Staphylococcus aureus POSITIVE (A) NEGATIVE Final    Comment:        The Xpert SA Assay (FDA approved for NASAL specimens in patients over 68 years of age), is one component of a comprehensive surveillance program.  Test performance has been validated by [facility] for patients greater than or equal to 37 year old. It is not intended to diagnose infection nor to guide or monitor treatment.   Culture, blood (routine x 2)     Status: None (Preliminary result)   Collection Time: 01/23/15  8:00 AM  Result Value Ref Range Status   Specimen Description BLOOD RIGHT ANTECUBITAL  Final   Special Requests BOTTLES DRAWN AEROBIC AND ANAEROBIC 5MLS  Final   Culture NO GROWTH 2 DAYS  Final   Report Status PENDING  Incomplete  Urine culture     Status: None   Collection Time: 01/23/15  8:05 AM  Result Value Ref Range Status   Specimen Description URINE, CATHETERIZED  Final   Special Requests Normal  Final   Culture NO GROWTH 1 DAY  Final   Report Status 01/24/2015 FINAL  Final  Culture, blood (routine x 2)     Status: None (Preliminary result)   Collection Time: 01/23/15  4:33 PM  Result Value Ref Range Status   Specimen Description BLOOD RIGHT HAND  Final   Special Requests BOTTLES DRAWN AEROBIC ONLY 4CC  Final   Culture NO GROWTH 2 DAYS  Final   Report Status PENDING  Incomplete  Urine culture     Status: None (Preliminary result)   Collection Time: 01/24/15  9:00 PM  Result Value Ref Range Status   Specimen Description URINE, CLEAN CATCH  Final   Special Requests NONE  Final   Culture NO GROWTH < 24 HOURS  Final   Report Status PENDING  Incomplete      Studies:  Ct Head Wo Contrast  01/23/2015  CLINICAL DATA:  64 year old male with fall. EXAM: CT HEAD WITHOUT CONTRAST CT CERVICAL SPINE WITHOUT CONTRAST TECHNIQUE: Multidetector CT imaging of the head and cervical spine was performed following the standard protocol without intravenous contrast. Multiplanar CT image reconstructions  of the cervical spine were also generated. COMPARISON:  None. FINDINGS: CT HEAD FINDINGS The ventricles and sulci are appropriate in size for patient's age. Mild periventricular and deep white matter hypodensities represent chronic microvascular ischemic changes. There is no intracranial hemorrhage. No mass effect or midline shift identified. An endotracheal  and enteric tube are partially visualized. There is diffuse mucoperiosteal thickening of the paranasal sinuses with partial opacification of the ethmoid air cells. The mastoid air cells are well aerated. Right occipital scalp hematoma. The calvarium is intact. CT CERVICAL SPINE FINDINGS There is no acute fracture or subluxation of the cervical spine.Mild multilevel degenerative changes.The odontoid and spinous processes are intact.There is normal anatomic alignment of the C1-C2 lateral masses. The visualized soft tissues appear unremarkable. An enteric tube and an endotracheal tube are partially visualized. Partially visualized bilateral pleural effusions, right greater than left. There is bilateral supraclavicular and right cervical adenopathy. Upper mediastinal lymphadenopathy noted. Chest CT is recommended for further evaluation. Right supraclavicular soft tissue hemorrhage noted. IMPRESSION: No acute intracranial hemorrhage. 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 No acute/traumatic cervical spine pathology. Bilateral supraclavicular and right cervical as well as mediastinal adenopathy. Further evaluation recommended. Electronically Signed   By: Anner Crete M.D.   On: 01/23/2015 18:27   Ct Chest W Contrast  01/21/2015  CLINICAL DATA:  Mediastinal mass. EXAM: CT CHEST WITH CONTRAST TECHNIQUE: Multidetector CT imaging of the chest was performed during intravenous contrast administration. CONTRAST:  50m OMNIPAQUE IOHEXOL 300 MG/ML  SOLN COMPARISON:  CT abdomen and pelvis dated 01/20/2015. FINDINGS: On yesterday's CT of the  abdomen and pelvis, a large ulcerated mass involving the distal stomach and proximal duodenum was identified, with extension to the porta hepatis and gastrohepatic region, suggesting lymphoma. Associated lymphadenopathy was identified in the lower anterior mediastinum, retroperitoneum and pelvis, further suggesting lymphoma. Additional lymphadenopathy was suspected in the sub-carinal region of the mediastinum, incompletely imaged. On today's study, conglomerate subcarinal lymphadenopathy is confirmed, measuring 7.5 x 2.8 cm on image 33 of series 201. This conglomerate lymphadenopathy extends upwards within the mediastinum encasing the mid and lower portions of the trachea, without effacement of the trachea. At the level of the lower trachea, the conglomerate lymphadenopathy measures approximately 5.9 x 3.9 cm. Additional lymphadenopathy within the anterior mediastinum measures 4.4 x 2.3 cm (image 17, series 201). Additional smaller lymph nodes scattered throughout the mediastinum and bilateral perihilar regions. Numerous small and moderately enlarged lymph nodes are seen within each axillary region. Clustered small and enlarged lymph nodes are seen within the supraclavicular regions bilaterally. There is a right pleural effusion, small to moderate in size, with adjacent compressive atelectasis. There is also a small left pleural effusion with adjacent atelectasis. Very mild degenerative change noted within the cervical and thoracic spine. No acute osseous abnormality identified. Thoracic aorta is normal in caliber and configuration. Heart size is normal. No pericardial effusion. No central obstructing pulmonary embolism seen. Upper abdominal findings described on yesterday's CT. IMPRESSION: 1. Extensive mediastinal lymphadenopathy, bilateral axillary lymphadenopathy and supraclavicular lymphadenopathy. 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 2. Right pleural effusion, small to moderate in size, with adjacent atelectasis. Additional smaller left pleural effusion. Electronically Signed   By: SFranki CabotM.D.   On: 01/21/2015 16:47   Ct Cervical Spine Wo Contrast  01/23/2015  CLINICAL DATA:  64year old male with fall. EXAM: CT HEAD WITHOUT CONTRAST CT CERVICAL SPINE WITHOUT CONTRAST TECHNIQUE: Multidetector CT imaging of the head and cervical spine was performed following the standard protocol without intravenous contrast. Multiplanar CT image reconstructions of the cervical spine were also generated. COMPARISON:  None. FINDINGS: CT HEAD FINDINGS The ventricles and sulci are appropriate in size for patient's age. Mild periventricular and deep white matter  hypodensities represent chronic microvascular ischemic changes. There is no intracranial hemorrhage. No mass effect or midline shift identified. An endotracheal and enteric tube are partially visualized. There is diffuse mucoperiosteal thickening of the paranasal sinuses with partial opacification of the ethmoid air cells. The mastoid air cells are well aerated. Right occipital scalp hematoma. The calvarium is intact. CT CERVICAL SPINE FINDINGS There is no acute fracture or subluxation of the cervical spine.Mild multilevel degenerative changes.The odontoid and spinous processes are intact.There is normal anatomic alignment of the C1-C2 lateral masses. The visualized soft tissues appear unremarkable. An enteric tube and an endotracheal tube are partially visualized. Partially visualized bilateral pleural effusions, right greater than left. There is bilateral supraclavicular and right cervical adenopathy. Upper mediastinal lymphadenopathy noted. Chest CT is recommended for further evaluation. Right supraclavicular soft tissue hemorrhage noted. IMPRESSION: No acute intracranial hemorrhage. 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 No acute/traumatic  cervical spine pathology. Bilateral supraclavicular and right cervical as well as mediastinal adenopathy. Further evaluation recommended. Electronically Signed   By: Anner Crete M.D.   On: 01/23/2015 18:27   Ct Abdomen Pelvis W Contrast  01/20/2015  CLINICAL DATA:  GI bleed with syncopal episodes. Massive duodenal bulb ulcer by EGD. Abnormal abdominal ultrasound demonstrating heterogeneous soft tissue prominence in the region of the porta hepatis pancreatic head. EXAM: CT ABDOMEN AND PELVIS WITH CONTRAST TECHNIQUE: Multidetector CT imaging of the abdomen and pelvis was performed using the standard protocol following bolus administration of intravenous contrast. CONTRAST:  141m OMNIPAQUE IOHEXOL 300 MG/ML  SOLN COMPARISON:  Right upper quadrant abdominal ultrasound on 01/19/2015. FINDINGS: In the lower chest, soft tissue mass in the anterior lower mediastinum abuts the pericardium and measures roughly 4.0 x 7.7 cm in axial dimensions. This is most likely consistent with a lymph node mass. The superior images also show potentially the bottom margin of subcarinal lymphadenopathy. There are small bilateral pleural effusions, right greater than left. In abdomen, a large confluent mass is identified involving the distal stomach and proximal duodenum with visible ulceration. At the level of the proximal duodenum, thickening of the duodenal wall approaches 3.5 cm. Soft tissue mass extends into the porta hepatis and gastrohepatic region and likely tracks along portal triads into the liver. Soft tissue mass also abuts and obscures definition of the pancreatic head and proximal pancreatic body. Mass also surrounds the portal vein, portal confluence and abuts the superior mesenteric vein. There is associated retroperitoneal lymphadenopathy in the lower abdomen and pelvis. Extensive bilateral iliac chain lymphadenopathy present. Elongated external iliac lymph node masses bilaterally measure roughly 1.8 cm in short  axis. Multiple mildly enlarged bilateral inguinal lymph nodes also present. 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 No evidence of bowel obstruction or free intraperitoneal air. No focal abscess is identified. The gallbladder, distal pancreas, spleen, adrenal glands and kidneys appear unremarkable. No bony lesions are seen. The bladder is decompressed. No hernias are identified. IMPRESSION: Large ulcerated mass involving the distal stomach and proximal duodenum and extending into the porta hepatis and gastrohepatic region. Associated lymphadenopathy in the lower anterior mediastinum, retroperitoneum and pelvis. Also suspected subcarinal lymphadenopathy is partially visualized. 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 Electronically Signed   By: GAletta EdouardM.D.   On: 01/20/2015 13:50   Ir Angiogram Visceral Selective  01/23/2015  CLINICAL DATA:  64year old male presents with acute life-threatening upper GI bleed secondary to an ulcerated mass encompassing the distal stomach and  duodenum. 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 Endoscopic treatment options are limited and surgery is extremely high risk. Patient presents for emergent visceral arteriography and embolization. Additionally, central venous access is required. A triple-lumen central venous catheter will be placed during the course of the procedure. EXAM: SELECTIVE VISCERAL ARTERIOGRAPHY; IR EMBO ART VEN HEMORR LYMPH EXTRAV INC GUIDE ROADMAPPING; IR LEFT FLOURO GUIDE CV LINE; ADDITIONAL ARTERIOGRAPHY; IR ULTRASOUND GUIDANCE VASC ACCESS LEFT; ARTERIOGRAPHY Date: 01/23/2015 PROCEDURE: 1. Ultrasound-guided puncture left common femoral artery 2. Catheterization of the superior mesenteric artery with arteriogram 3. Catheterization of the celiac artery with  arteriogram 4. Catheterization of the gastroduodenal artery with arteriogram 5. Catheterization of the right gastroepiploic artery with arteriogram 6. Coil embolization of the gastroduodenal artery 7. Post embolization arteriogram 8. Limited left common femoral arteriogram 9. Ultrasound-guided puncture of the left common femoral vein 10. Placement of a triple-lumen central venous catheter via the left femoral vein with fluoroscopic guidance Interventional Radiologist:  Criselda Peaches, MD ANESTHESIA/SEDATION: Moderate (conscious) sedation was used. 1 mg Versed, 50 mcg Fentanyl were administered intravenously. The patient's vital signs were monitored continuously by radiology nursing throughout the procedure. Sedation Time: 60 minutes MEDICATIONS: None additional FLUOROSCOPY TIME:  16 minutes for a total of 622 mGy CONTRAST:  161m OMNIPAQUE IOHEXOL 300 MG/ML  SOLN TECHNIQUE: Informed consent was obtained from the patient following explanation of the procedure, risks, benefits and alternatives. The patient understands, agrees and consents for the procedure. All questions were addressed. A time out was performed. Maximal barrier sterile technique utilized including caps, mask, sterile gowns, sterile gloves, large sterile drape, hand hygiene, and Betadine skin prep. The left groin was interrogated with ultrasound. The common femoral artery is found to be widely patent. An image was obtained and stored for the medical record. Local anesthesia was attained by infiltration with 1% lidocaine. Under real-time sonographic guidance, the vessel was punctured with a 21 gauge micropuncture needle. Using standard technique, the initial micro wire was exchanged through a transitional 5 [place] sheath for a working 0.035 inch Bentson wire. The micro sheath was then exchanged for a working 5 [place] sheath. A C2 cobra catheter was advanced into the abdominal aorta over the wire. The catheter was initially advanced  into the superior mesenteric artery and superior mesenteric arteriogram was performed. There is conventional anatomy. No replaced or accessory right hepatic artery. No evidence of active extravasation or significant collateralization through the pancreaticoduodenal arcade. The C2 cobra catheter was next advanced into the celiac artery. An arteriogram was performed. The gastroduodenal artery is elongated and slightly irregular in the mid segment likely secondary to external mass effect. There is no evidence of active extravasation. A renegade ST micro catheter was advanced coaxially over a fat and 16 wire. The micro catheter was advanced into the gastroduodenal artery. Arteriography was again performed. There are multiple branches arising from the gastroduodenal artery providing supply to a hypervascular mass in the region the descending duodenum. The micro catheter was carefully advanced more distally into the proximal right gastroepiploic artery. Contrast injection was performed confirming the location of the catheter tip as well as confirming no additional supplied to the region of tumor. Coil embolization was then performed of the gastroduodenal artery using a combination of vortex, standard helical and soft interlock detachable micro coils ranging in size from 2-6 mm. Post embolization arteriography confirms successful embolization of the vessel with no further visible hypervascular blush of the duodenum  tumor. The micro catheter was removed. Repeat celiac artery injection was performed confirming no additional collateral supply. The 5 French catheter was readvanced of the superior mesenteric artery and an additional angiogram was performed again confirming no collateral flow to the embolized territory. The catheters were removed. The vascular sheath was left in place for hemodynamic monitoring. Ultrasound was again used to interrogate the left groin. The left common femoral vein was identified. Local anesthesia  was attained by infiltration with 1% lidocaine. The left common femoral vein was punctured with an 18 gauge needle. A wire was advanced into the inferior vena cava. The skin tract was dilated and an arrow triple-lumen central venous catheter advanced over the wire and position with the tip at the confluence of the IVC and left common iliac vein. The catheter was flushed, capped and secured with 0 Prolene suture. Sterile bandages were applied. COMPLICATIONS: None IMPRESSION: 1. No evidence of active extravasation. 2. 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 3. Coil embolization of the gastroduodenal artery. 4. Placement of a left femoral triple-lumen central venous catheter. The catheter tip is at the confluence of the IVC and iliac vein and ready for immediate use. Signed, Criselda Peaches, MD [facility] Electronically Signed   By: Jacqulynn Cadet M.D.   On: 01/23/2015 15:50   Ir Angiogram Visceral Selective  01/23/2015  CLINICAL DATA:  64 year old male presents with acute life-threatening upper GI bleed secondary to an ulcerated mass encompassing the distal stomach and duodenum. 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 Endoscopic treatment options are limited and surgery is extremely high risk. Patient presents for emergent visceral arteriography and embolization. Additionally, central venous access is required. A triple-lumen central venous catheter will be placed during the course of the procedure. EXAM: SELECTIVE VISCERAL ARTERIOGRAPHY; IR EMBO ART VEN HEMORR LYMPH EXTRAV INC GUIDE ROADMAPPING; IR LEFT FLOURO GUIDE CV LINE; ADDITIONAL ARTERIOGRAPHY; IR ULTRASOUND GUIDANCE VASC ACCESS LEFT; ARTERIOGRAPHY Date: 01/23/2015 PROCEDURE: 1. Ultrasound-guided puncture left common  femoral artery 2. Catheterization of the superior mesenteric artery with arteriogram 3. Catheterization of the celiac artery with arteriogram 4. Catheterization of the gastroduodenal artery with arteriogram 5. Catheterization of the right gastroepiploic artery with arteriogram 6. Coil embolization of the gastroduodenal artery 7. Post embolization arteriogram 8. Limited left common femoral arteriogram 9. Ultrasound-guided puncture of the left common femoral vein 10. Placement of a triple-lumen central venous catheter via the left femoral vein with fluoroscopic guidance Interventional Radiologist:  Criselda Peaches, MD ANESTHESIA/SEDATION: Moderate (conscious) sedation was used. 1 mg Versed, 50 mcg Fentanyl were administered intravenously. The patient's vital signs were monitored continuously by radiology nursing throughout the procedure. Sedation Time: 60 minutes MEDICATIONS: None additional FLUOROSCOPY TIME:  16 minutes for a total of 622 mGy CONTRAST:  129m OMNIPAQUE IOHEXOL 300 MG/ML  SOLN TECHNIQUE: Informed consent was obtained from the patient following explanation of the procedure, risks, benefits and alternatives. The patient understands, agrees and consents for the procedure. All questions were addressed. A time out was performed. Maximal barrier sterile technique utilized including caps, mask, sterile gowns, sterile gloves, large sterile drape, hand hygiene, and Betadine skin prep. The left groin was interrogated with ultrasound. The common femoral artery is found to be widely patent. An image was obtained and stored for the medical record. Local anesthesia was attained by infiltration with 1% lidocaine. Under real-time sonographic  guidance, the vessel was punctured with a 21 gauge micropuncture needle. Using standard technique, the initial micro wire was exchanged through a transitional 5 [place] micro sheath for a working 0.035 inch Bentson wire. The micro sheath was then exchanged for a working 5  [place] vascular sheath. A C2 cobra catheter was advanced into the abdominal aorta over the wire. The catheter was initially advanced into the superior mesenteric artery and superior mesenteric arteriogram was performed. There is conventional anatomy. No replaced or accessory right hepatic artery. No evidence of active extravasation or significant collateralization through the pancreaticoduodenal arcade. The C2 cobra catheter was next advanced into the celiac artery. An arteriogram was performed. The gastroduodenal artery is elongated and slightly irregular in the mid segment likely secondary to external mass effect. There is no evidence of active extravasation. A renegade ST micro catheter was advanced coaxially over a fat and 16 wire. The micro catheter was advanced into the gastroduodenal artery. Arteriography was again performed. There are multiple branches arising from the gastroduodenal artery providing supply to a hypervascular mass in the region the descending duodenum. The micro catheter was carefully advanced more distally into the proximal right gastroepiploic artery. Contrast injection was performed confirming the location of the catheter tip as well as confirming no additional supplied to the region of tumor. Coil embolization was then performed of the gastroduodenal artery using a combination of vortex, standard helical and soft interlock detachable micro coils ranging in size from 2-6 mm. Post embolization arteriography confirms successful embolization of the vessel with no further visible hypervascular blush of the duodenum tumor. The micro catheter was removed. Repeat celiac artery injection was performed confirming no additional collateral supply. The 5 French catheter was readvanced of the superior mesenteric artery and an additional angiogram was performed again confirming no collateral flow to the embolized territory. The catheters were removed. The vascular sheath was left in place for  hemodynamic monitoring. Ultrasound was again used to interrogate the left groin. The left common femoral vein was identified. Local anesthesia was attained by infiltration with 1% lidocaine. The left common femoral vein was punctured with an 18 gauge needle. A wire was advanced into the inferior vena cava. The skin tract was dilated and an arrow triple-lumen central venous catheter advanced over the wire and position with the tip at the confluence of the IVC and left common iliac vein. The catheter was flushed, capped and secured with 0 Prolene suture. Sterile bandages were applied. COMPLICATIONS: None IMPRESSION: 1. No evidence of active extravasation. 2. 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 3. Coil embolization of the gastroduodenal artery. 4. Placement of a left femoral triple-lumen central venous catheter. The catheter tip is at the confluence of the IVC and iliac vein and ready for immediate use. Signed, Criselda Peaches, MD [facility] Radiology Electronically Signed   By: Jacqulynn Cadet M.D.   On: 01/23/2015 15:50   Ir Angiogram Selective Each Additional Vessel  01/23/2015  CLINICAL DATA:  64 year old male presents with acute life-threatening upper GI bleed secondary to an ulcerated mass encompassing the distal stomach and duodenum. 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 Endoscopic treatment options are limited and surgery is extremely high risk. Patient presents for emergent visceral arteriography and embolization. Additionally, central venous access is required. A triple-lumen central venous catheter will be placed during the course of the procedure.  EXAM: SELECTIVE VISCERAL ARTERIOGRAPHY; IR EMBO ART VEN HEMORR LYMPH EXTRAV INC GUIDE ROADMAPPING; IR LEFT FLOURO GUIDE CV  LINE; ADDITIONAL ARTERIOGRAPHY; IR ULTRASOUND GUIDANCE VASC ACCESS LEFT; ARTERIOGRAPHY Date: 01/23/2015 PROCEDURE: 1. Ultrasound-guided puncture left common femoral artery 2. Catheterization of the superior mesenteric artery with arteriogram 3. Catheterization of the celiac artery with arteriogram 4. Catheterization of the gastroduodenal artery with arteriogram 5. Catheterization of the right gastroepiploic artery with arteriogram 6. Coil embolization of the gastroduodenal artery 7. Post embolization arteriogram 8. Limited left common femoral arteriogram 9. Ultrasound-guided puncture of the left common femoral vein 10. Placement of a triple-lumen central venous catheter via the left femoral vein with fluoroscopic guidance Interventional Radiologist:  Criselda Peaches, MD ANESTHESIA/SEDATION: Moderate (conscious) sedation was used. 1 mg Versed, 50 mcg Fentanyl were administered intravenously. The patient's vital signs were monitored continuously by radiology nursing throughout the procedure. Sedation Time: 60 minutes MEDICATIONS: None additional FLUOROSCOPY TIME:  16 minutes for a total of 622 mGy CONTRAST:  128m OMNIPAQUE IOHEXOL 300 MG/ML  SOLN TECHNIQUE: Informed consent was obtained from the patient following explanation of the procedure, risks, benefits and alternatives. The patient understands, agrees and consents for the procedure. All questions were addressed. A time out was performed. Maximal barrier sterile technique utilized including caps, mask, sterile gowns, sterile gloves, large sterile drape, hand hygiene, and Betadine skin prep. The left groin was interrogated with ultrasound. The common femoral artery is found to be widely patent. An image was obtained and stored for the medical record. Local anesthesia was attained by infiltration with 1% lidocaine. Under real-time sonographic guidance, the vessel was punctured with a 21 gauge micropuncture needle. Using standard technique, the initial micro  wire was exchanged through a transitional 5 [place] sheath for a working 0.035 inch Bentson wire. The micro sheath was then exchanged for a working 5 [place] sheath. A C2 cobra catheter was advanced into the abdominal aorta over the wire. The catheter was initially advanced into the superior mesenteric artery and superior mesenteric arteriogram was performed. There is conventional anatomy. No replaced or accessory right hepatic artery. No evidence of active extravasation or significant collateralization through the pancreaticoduodenal arcade. The C2 cobra catheter was next advanced into the celiac artery. An arteriogram was performed. The gastroduodenal artery is elongated and slightly irregular in the mid segment likely secondary to external mass effect. There is no evidence of active extravasation. A renegade ST micro catheter was advanced coaxially over a fat and 16 wire. The micro catheter was advanced into the gastroduodenal artery. Arteriography was again performed. There are multiple branches arising from the gastroduodenal artery providing supply to a hypervascular mass in the region the descending duodenum. The micro catheter was carefully advanced more distally into the proximal right gastroepiploic artery. Contrast injection was performed confirming the location of the catheter tip as well as confirming no additional supplied to the region of tumor. Coil embolization was then performed of the gastroduodenal artery using a combination of vortex, standard helical and soft interlock detachable micro coils ranging in size from 2-6 mm. Post embolization arteriography confirms successful embolization of the vessel with no further visible hypervascular blush of the duodenum tumor. The micro catheter was removed. Repeat celiac artery injection was performed confirming no additional collateral supply. The 5 French catheter was readvanced of the superior mesenteric artery and an additional angiogram  was performed again confirming no collateral flow to the embolized territory. The catheters were removed. The vascular sheath was left in place for  hemodynamic monitoring. Ultrasound was again used to interrogate the left groin. The left common femoral vein was identified. Local anesthesia was attained by infiltration with 1% lidocaine. The left common femoral vein was punctured with an 18 gauge needle. A wire was advanced into the inferior vena cava. The skin tract was dilated and an arrow triple-lumen central venous catheter advanced over the wire and position with the tip at the confluence of the IVC and left common iliac vein. The catheter was flushed, capped and secured with 0 Prolene suture. Sterile bandages were applied. COMPLICATIONS: None IMPRESSION: 1. No evidence of active extravasation. 2. 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 3. Coil embolization of the gastroduodenal artery. 4. Placement of a left femoral triple-lumen central venous catheter. The catheter tip is at the confluence of the IVC and iliac vein and ready for immediate use. Signed, Criselda Peaches, MD [facility] Radiology Electronically Signed   By: Jacqulynn Cadet M.D.   On: 01/23/2015 15:50   Ir Angiogram Follow Up Study  01/23/2015  CLINICAL DATA:  64 year old male presents with acute life-threatening upper GI bleed secondary to an ulcerated mass encompassing the distal stomach and duodenum. 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 Endoscopic treatment options are limited and surgery is extremely high risk. Patient presents for emergent visceral arteriography and embolization. Additionally, central venous access is required. A triple-lumen central venous catheter will be placed during the course of the  procedure. EXAM: SELECTIVE VISCERAL ARTERIOGRAPHY; IR EMBO ART VEN HEMORR LYMPH EXTRAV INC GUIDE ROADMAPPING; IR LEFT FLOURO GUIDE CV LINE; ADDITIONAL ARTERIOGRAPHY; IR ULTRASOUND GUIDANCE VASC ACCESS LEFT; ARTERIOGRAPHY Date: 01/23/2015 PROCEDURE: 1. Ultrasound-guided puncture left common femoral artery 2. Catheterization of the superior mesenteric artery with arteriogram 3. Catheterization of the celiac artery with arteriogram 4. Catheterization of the gastroduodenal artery with arteriogram 5. Catheterization of the right gastroepiploic artery with arteriogram 6. Coil embolization of the gastroduodenal artery 7. Post embolization arteriogram 8. Limited left common femoral arteriogram 9. Ultrasound-guided puncture of the left common femoral vein 10. Placement of a triple-lumen central venous catheter via the left femoral vein with fluoroscopic guidance Interventional Radiologist:  Criselda Peaches, MD ANESTHESIA/SEDATION: Moderate (conscious) sedation was used. 1 mg Versed, 50 mcg Fentanyl were administered intravenously. The patient's vital signs were monitored continuously by radiology nursing throughout the procedure. Sedation Time: 60 minutes MEDICATIONS: None additional FLUOROSCOPY TIME:  16 minutes for a total of 622 mGy CONTRAST:  160m OMNIPAQUE IOHEXOL 300 MG/ML  SOLN TECHNIQUE: Informed consent was obtained from the patient following explanation of the procedure, risks, benefits and alternatives. The patient understands, agrees and consents for the procedure. All questions were addressed. A time out was performed. Maximal barrier sterile technique utilized including caps, mask, sterile gowns, sterile gloves, large sterile drape, hand hygiene, and Betadine skin prep. The left groin was interrogated with ultrasound. The common femoral artery is found to be widely patent. An image was obtained and stored for the medical record. Local anesthesia was attained by infiltration with 1% lidocaine. Under  real-time sonographic guidance, the vessel was punctured with a 21 gauge micropuncture needle. Using standard technique, the initial micro wire was exchanged through a transitional 5 [place] sheath for a working 0.035 inch Bentson wire. The micro sheath was then exchanged for a working 5 [place] sheath. A C2 cobra catheter was advanced into  the abdominal aorta over the wire. The catheter was initially advanced into the superior mesenteric artery and superior mesenteric arteriogram was performed. There is conventional anatomy. No replaced or accessory right hepatic artery. No evidence of active extravasation or significant collateralization through the pancreaticoduodenal arcade. The C2 cobra catheter was next advanced into the celiac artery. An arteriogram was performed. The gastroduodenal artery is elongated and slightly irregular in the mid segment likely secondary to external mass effect. There is no evidence of active extravasation. A renegade ST micro catheter was advanced coaxially over a fat and 16 wire. The micro catheter was advanced into the gastroduodenal artery. Arteriography was again performed. There are multiple branches arising from the gastroduodenal artery providing supply to a hypervascular mass in the region the descending duodenum. The micro catheter was carefully advanced more distally into the proximal right gastroepiploic artery. Contrast injection was performed confirming the location of the catheter tip as well as confirming no additional supplied to the region of tumor. Coil embolization was then performed of the gastroduodenal artery using a combination of vortex, standard helical and soft interlock detachable micro coils ranging in size from 2-6 mm. Post embolization arteriography confirms successful embolization of the vessel with no further visible hypervascular blush of the duodenum tumor. The micro catheter was removed. Repeat celiac artery injection was performed  confirming no additional collateral supply. The 5 French catheter was readvanced of the superior mesenteric artery and an additional angiogram was performed again confirming no collateral flow to the embolized territory. The catheters were removed. The vascular sheath was left in place for hemodynamic monitoring. Ultrasound was again used to interrogate the left groin. The left common femoral vein was identified. Local anesthesia was attained by infiltration with 1% lidocaine. The left common femoral vein was punctured with an 18 gauge needle. A wire was advanced into the inferior vena cava. The skin tract was dilated and an arrow triple-lumen central venous catheter advanced over the wire and position with the tip at the confluence of the IVC and left common iliac vein. The catheter was flushed, capped and secured with 0 Prolene suture. Sterile bandages were applied. COMPLICATIONS: None IMPRESSION: 1. No evidence of active extravasation. 2. 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 3. Coil embolization of the gastroduodenal artery. 4. Placement of a left femoral triple-lumen central venous catheter. The catheter tip is at the confluence of the IVC and iliac vein and ready for immediate use. Signed, Criselda Peaches, MD [facility] Radiology Electronically Signed   By: Jacqulynn Cadet M.D.   On: 01/23/2015 15:50   Ir Fluoro Guide Cv Line Left  01/23/2015  CLINICAL DATA:  64 year old male presents with acute life-threatening upper GI bleed secondary to an ulcerated mass encompassing the distal stomach and duodenum. 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 Endoscopic treatment options are limited and surgery is extremely high risk. Patient presents for emergent visceral arteriography and  embolization. Additionally, central venous access is required. A triple-lumen central venous catheter will be placed during the course of the procedure. EXAM: SELECTIVE VISCERAL ARTERIOGRAPHY; IR EMBO ART VEN HEMORR LYMPH EXTRAV INC GUIDE ROADMAPPING; IR LEFT FLOURO GUIDE CV LINE; ADDITIONAL ARTERIOGRAPHY; IR ULTRASOUND GUIDANCE VASC ACCESS LEFT; ARTERIOGRAPHY Date: 01/23/2015 PROCEDURE: 1. Ultrasound-guided puncture left common femoral artery 2. Catheterization of the superior mesenteric artery with arteriogram 3. Catheterization of the celiac artery  with arteriogram 4. Catheterization of the gastroduodenal artery with arteriogram 5. Catheterization of the right gastroepiploic artery with arteriogram 6. Coil embolization of the gastroduodenal artery 7. Post embolization arteriogram 8. Limited left common femoral arteriogram 9. Ultrasound-guided puncture of the left common femoral vein 10. Placement of a triple-lumen central venous catheter via the left femoral vein with fluoroscopic guidance Interventional Radiologist:  Criselda Peaches, MD ANESTHESIA/SEDATION: Moderate (conscious) sedation was used. 1 mg Versed, 50 mcg Fentanyl were administered intravenously. The patient's vital signs were monitored continuously by radiology nursing throughout the procedure. Sedation Time: 60 minutes MEDICATIONS: None additional FLUOROSCOPY TIME:  16 minutes for a total of 622 mGy CONTRAST:  188m OMNIPAQUE IOHEXOL 300 MG/ML  SOLN TECHNIQUE: Informed consent was obtained from the patient following explanation of the procedure, risks, benefits and alternatives. The patient understands, agrees and consents for the procedure. All questions were addressed. A time out was performed. Maximal barrier sterile technique utilized including caps, mask, sterile gowns, sterile gloves, large sterile drape, hand hygiene, and Betadine skin prep. The left groin was interrogated with ultrasound. The common femoral artery is found to be widely  patent. An image was obtained and stored for the medical record. Local anesthesia was attained by infiltration with 1% lidocaine. Under real-time sonographic guidance, the vessel was punctured with a 21 gauge micropuncture needle. Using standard technique, the initial micro wire was exchanged through a transitional 5 [place] sheath for a working 0.035 inch Bentson wire. The micro sheath was then exchanged for a working 5 [place] sheath. A C2 cobra catheter was advanced into the abdominal aorta over the wire. The catheter was initially advanced into the superior mesenteric artery and superior mesenteric arteriogram was performed. There is conventional anatomy. No replaced or accessory right hepatic artery. No evidence of active extravasation or significant collateralization through the pancreaticoduodenal arcade. The C2 cobra catheter was next advanced into the celiac artery. An arteriogram was performed. The gastroduodenal artery is elongated and slightly irregular in the mid segment likely secondary to external mass effect. There is no evidence of active extravasation. A renegade ST micro catheter was advanced coaxially over a fat and 16 wire. The micro catheter was advanced into the gastroduodenal artery. Arteriography was again performed. There are multiple branches arising from the gastroduodenal artery providing supply to a hypervascular mass in the region the descending duodenum. The micro catheter was carefully advanced more distally into the proximal right gastroepiploic artery. Contrast injection was performed confirming the location of the catheter tip as well as confirming no additional supplied to the region of tumor. Coil embolization was then performed of the gastroduodenal artery using a combination of vortex, standard helical and soft interlock detachable micro coils ranging in size from 2-6 mm. Post embolization arteriography confirms successful embolization of the vessel with no  further visible hypervascular blush of the duodenum tumor. The micro catheter was removed. Repeat celiac artery injection was performed confirming no additional collateral supply. The 5 French catheter was readvanced of the superior mesenteric artery and an additional angiogram was performed again confirming no collateral flow to the embolized territory. The catheters were removed. The vascular sheath was left in place for hemodynamic monitoring. Ultrasound was again used to interrogate the left groin. The left common femoral vein was identified. Local anesthesia was attained by infiltration with 1% lidocaine. The left common femoral vein was punctured with an 18 gauge needle. A wire was advanced into the inferior vena cava. The skin tract was dilated and an  arrow triple-lumen central venous catheter advanced over the wire and position with the tip at the confluence of the IVC and left common iliac vein. The catheter was flushed, capped and secured with 0 Prolene suture. Sterile bandages were applied. COMPLICATIONS: None IMPRESSION: 1. No evidence of active extravasation. 2. 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 3. Coil embolization of the gastroduodenal artery. 4. Placement of a left femoral triple-lumen central venous catheter. The catheter tip is at the confluence of the IVC and iliac vein and ready for immediate use. Signed, Criselda Peaches, MD [facility] Radiology Electronically Signed   By: Jacqulynn Cadet M.D.   On: 01/23/2015 15:50   Ir US Guide Vasc Access Left  01/23/2015  CLINICAL DATA:  64 year old male presents with acute life-threatening upper GI bleed secondary to an ulcerated mass encompassing the distal stomach and duodenum. 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  Endoscopic treatment options are limited and surgery is extremely high risk. Patient presents for emergent visceral arteriography and embolization. Additionally, central venous access is required. A triple-lumen central venous catheter will be placed during the course of the procedure. EXAM: SELECTIVE VISCERAL ARTERIOGRAPHY; IR EMBO ART VEN HEMORR LYMPH EXTRAV INC GUIDE ROADMAPPING; IR LEFT FLOURO GUIDE CV LINE; ADDITIONAL ARTERIOGRAPHY; IR ULTRASOUND GUIDANCE VASC ACCESS LEFT; ARTERIOGRAPHY Date: 01/23/2015 PROCEDURE: 1. Ultrasound-guided puncture left common femoral artery 2. Catheterization of the superior mesenteric artery with arteriogram 3. Catheterization of the celiac artery with arteriogram 4. Catheterization of the gastroduodenal artery with arteriogram 5. Catheterization of the right gastroepiploic artery with arteriogram 6. Coil embolization of the gastroduodenal artery 7. Post embolization arteriogram 8. Limited left common femoral arteriogram 9. Ultrasound-guided puncture of the left common femoral vein 10. Placement of a triple-lumen central venous catheter via the left femoral vein with fluoroscopic guidance Interventional Radiologist:  Criselda Peaches, MD ANESTHESIA/SEDATION: Moderate (conscious) sedation was used. 1 mg Versed, 50 mcg Fentanyl were administered intravenously. The patient's vital signs were monitored continuously by radiology nursing throughout the procedure. Sedation Time: 60 minutes MEDICATIONS: None additional FLUOROSCOPY TIME:  16 minutes for a total of 622 mGy CONTRAST:  188m OMNIPAQUE IOHEXOL 300 MG/ML  SOLN TECHNIQUE: Informed consent was obtained from the patient following explanation of the procedure, risks, benefits and alternatives. The patient understands, agrees and consents for the procedure. All questions were addressed. A time out was performed. Maximal barrier sterile technique utilized including caps, mask, sterile gowns, sterile gloves, large sterile drape,  hand hygiene, and Betadine skin prep. The left groin was interrogated with ultrasound. The common femoral artery is found to be widely patent. An image was obtained and stored for the medical record. Local anesthesia was attained by infiltration with 1% lidocaine. Under real-time sonographic guidance, the vessel was punctured with a 21 gauge micropuncture needle. Using standard technique, the initial micro wire was exchanged through a transitional 5 [place] sheath for a working 0.035 inch Bentson wire. The micro sheath was then exchanged for a working 5 [place] sheath. A C2 cobra catheter was advanced into the abdominal aorta over the wire. The catheter was initially advanced into the superior mesenteric artery and superior mesenteric arteriogram was performed. There is conventional anatomy. No replaced or accessory right hepatic artery. No evidence of active extravasation or significant collateralization through the pancreaticoduodenal arcade. The C2 cobra catheter was next advanced into the  celiac artery. An arteriogram was performed. The gastroduodenal artery is elongated and slightly irregular in the mid segment likely secondary to external mass effect. There is no evidence of active extravasation. A renegade ST micro catheter was advanced coaxially over a fat and 16 wire. The micro catheter was advanced into the gastroduodenal artery. Arteriography was again performed. There are multiple branches arising from the gastroduodenal artery providing supply to a hypervascular mass in the region the descending duodenum. The micro catheter was carefully advanced more distally into the proximal right gastroepiploic artery. Contrast injection was performed confirming the location of the catheter tip as well as confirming no additional supplied to the region of tumor. Coil embolization was then performed of the gastroduodenal artery using a combination of vortex, standard helical and soft interlock detachable  micro coils ranging in size from 2-6 mm. Post embolization arteriography confirms successful embolization of the vessel with no further visible hypervascular blush of the duodenum tumor. The micro catheter was removed. Repeat celiac artery injection was performed confirming no additional collateral supply. The 5 French catheter was readvanced of the superior mesenteric artery and an additional angiogram was performed again confirming no collateral flow to the embolized territory. The catheters were removed. The vascular sheath was left in place for hemodynamic monitoring. Ultrasound was again used to interrogate the left groin. The left common femoral vein was identified. Local anesthesia was attained by infiltration with 1% lidocaine. The left common femoral vein was punctured with an 18 gauge needle. A wire was advanced into the inferior vena cava. The skin tract was dilated and an arrow triple-lumen central venous catheter advanced over the wire and position with the tip at the confluence of the IVC and left common iliac vein. The catheter was flushed, capped and secured with 0 Prolene suture. Sterile bandages were applied. COMPLICATIONS: None IMPRESSION: 1. No evidence of active extravasation. 2. 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 3. Coil embolization of the gastroduodenal artery. 4. Placement of a left femoral triple-lumen central venous catheter. The catheter tip is at the confluence of the IVC and iliac vein and ready for immediate use. Signed, Criselda Peaches, MD [facility] Radiology Electronically Signed   By: Jacqulynn Cadet M.D.   On: 01/23/2015 15:50   Ir US Guide Vasc Access Left  01/23/2015  CLINICAL DATA:  64 year old male presents with acute life-threatening upper GI bleed secondary to an ulcerated mass encompassing the distal stomach and duodenum.  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 Endoscopic treatment options are limited and surgery is extremely high risk. Patient presents for emergent visceral arteriography and embolization. Additionally, central venous access is required. A triple-lumen central venous catheter will be placed during the course of the procedure. EXAM: SELECTIVE VISCERAL ARTERIOGRAPHY; IR EMBO ART VEN HEMORR LYMPH EXTRAV INC GUIDE ROADMAPPING; IR LEFT FLOURO GUIDE CV LINE; ADDITIONAL ARTERIOGRAPHY; IR ULTRASOUND GUIDANCE VASC ACCESS LEFT; ARTERIOGRAPHY Date: 01/23/2015 PROCEDURE: 1. Ultrasound-guided puncture left common femoral artery 2. Catheterization of the superior mesenteric artery with arteriogram 3. Catheterization of the celiac artery with arteriogram 4. Catheterization of the gastroduodenal artery with arteriogram 5. Catheterization of the right gastroepiploic artery with arteriogram 6. Coil embolization of the gastroduodenal artery 7. Post embolization arteriogram 8. Limited left common femoral arteriogram 9. Ultrasound-guided puncture of the left common femoral vein 10. Placement of a triple-lumen central venous catheter via  the left femoral vein with fluoroscopic guidance Interventional Radiologist:  Criselda Peaches, MD ANESTHESIA/SEDATION: Moderate (conscious) sedation was used. 1 mg Versed, 50 mcg Fentanyl were administered intravenously. The patient's vital signs were monitored continuously by radiology nursing throughout the procedure. Sedation Time: 60 minutes MEDICATIONS: None additional FLUOROSCOPY TIME:  16 minutes for a total of 622 mGy CONTRAST:  179m OMNIPAQUE IOHEXOL 300 MG/ML  SOLN TECHNIQUE: Informed consent was obtained from the patient following explanation of the procedure, risks, benefits and alternatives. The patient understands, agrees and consents for the procedure. All questions were addressed. A time out was  performed. Maximal barrier sterile technique utilized including caps, mask, sterile gowns, sterile gloves, large sterile drape, hand hygiene, and Betadine skin prep. The left groin was interrogated with ultrasound. The common femoral artery is found to be widely patent. An image was obtained and stored for the medical record. Local anesthesia was attained by infiltration with 1% lidocaine. Under real-time sonographic guidance, the vessel was punctured with a 21 gauge micropuncture needle. Using standard technique, the initial micro wire was exchanged through a transitional 5 [place] sheath for a working 0.035 inch Bentson wire. The micro sheath was then exchanged for a working 5 [place] sheath. A C2 cobra catheter was advanced into the abdominal aorta over the wire. The catheter was initially advanced into the superior mesenteric artery and superior mesenteric arteriogram was performed. There is conventional anatomy. No replaced or accessory right hepatic artery. No evidence of active extravasation or significant collateralization through the pancreaticoduodenal arcade. The C2 cobra catheter was next advanced into the celiac artery. An arteriogram was performed. The gastroduodenal artery is elongated and slightly irregular in the mid segment likely secondary to external mass effect. There is no evidence of active extravasation. A renegade ST micro catheter was advanced coaxially over a fat and 16 wire. The micro catheter was advanced into the gastroduodenal artery. Arteriography was again performed. There are multiple branches arising from the gastroduodenal artery providing supply to a hypervascular mass in the region the descending duodenum. The micro catheter was carefully advanced more distally into the proximal right gastroepiploic artery. Contrast injection was performed confirming the location of the catheter tip as well as confirming no additional supplied to the region of tumor. Coil  embolization was then performed of the gastroduodenal artery using a combination of vortex, standard helical and soft interlock detachable micro coils ranging in size from 2-6 mm. Post embolization arteriography confirms successful embolization of the vessel with no further visible hypervascular blush of the duodenum tumor. The micro catheter was removed. Repeat celiac artery injection was performed confirming no additional collateral supply. The 5 French catheter was readvanced of the superior mesenteric artery and an additional angiogram was performed again confirming no collateral flow to the embolized territory. The catheters were removed. The vascular sheath was left in place for hemodynamic monitoring. Ultrasound was again used to interrogate the left groin. The left common femoral vein was identified. Local anesthesia was attained by infiltration with 1% lidocaine. The left common femoral vein was punctured with an 18 gauge needle. A wire was advanced into the inferior vena cava. The skin tract was dilated and an arrow triple-lumen central venous catheter advanced over the wire and position with the tip at the confluence of the IVC and left common iliac vein. The catheter was flushed, capped and secured with 0 Prolene suture. Sterile bandages were applied. COMPLICATIONS: None IMPRESSION: 1. No evidence of active extravasation. 2. Positive hypervascular tumor blush  in the region of the descending duodenum with arterial supply from the gastroduodenal artery. 3. Coil embolization of the gastroduodenal artery. 4. Placement of a left femoral triple-lumen central venous catheter. The catheter tip is at the confluence of the IVC and iliac vein and ready for immediate use. Signed, Criselda Peaches, MD [facility] Radiology Electronically Signed   By: Jacqulynn Cadet M.D.   On: 01/23/2015 15:50   Portable Chest Xray  01/24/2015  CLINICAL DATA:  Respiratory  failure. EXAM: PORTABLE CHEST 1 VIEW COMPARISON:  01/23/2015 FINDINGS: Endotracheal tube and NG tube in stable position. Cardiomegaly with normal pulmonary vascularity.Persistent increased density noted over the right Lower lung, most likely layering pleural effusion. Underlying pulmonary infiltrate cannot be excluded. Small left pleural effusion cannot be excluded. Low lung volumes basilar atelectasis. No pneumothorax. IMPRESSION: 1. Lines and tubes in stable position. 2. Persistent increased density of the right lower lung, most likely layering pleural effusion. Underlying infiltrate cannot be excluded. 3. Low lung volumes with basilar atelectasis. Electronically Signed   By: Marcello Moores  Register   On: 01/24/2015 07:39   Dg Chest Portable 1 View  01/23/2015  CLINICAL DATA:  Assess orogastric tube positioning EXAM: PORTABLE CHEST 1 VIEW COMPARISON:  Portable chest x-ray of earlier today at 8:01 a.m. FINDINGS: The esophagogastric tube tip projects below the inferior margin of the image. There is no evidence of cord ongoing within the esophagus. The endotracheal tube tip lies 4.1 cm above the carina. The cardiac silhouette is mildly enlarged but stable. The central pulmonary vascularity is engorged especially on the right. There is mild to moderate pulmonary interstitial edema on the right. There is no pleural effusion or pneumothorax. The observed bony thorax exhibits no acute abnormality. External pacemaker -defibrillator pads. Are present IMPRESSION: 1. The esophagogastric tube tip projects below the inferior margin of the image with no evidence of coiling within the esophagus. The endotracheal tube is in appropriate position. 2. Persistently increased perihilar interstitial density especially on the right consistent with pulmonary interstitial edema. There may be pleural fluid on the right layering posteriorly contributing to the findings here. Electronically Signed   By: David  Martinique M.D.   On: 01/23/2015 09:22    Dg Chest Port 1 View  01/23/2015  CLINICAL DATA:  Status post intubation.  Intra abdominal mass. EXAM: PORTABLE CHEST 1 VIEW COMPARISON:  CT chest 01/21/2015.  CT abdomen 01/21/2015. FINDINGS: The patient has a new endotracheal tube in place with the tip 3.5 cm above the carina. Hazy opacity over the right chest is consistent with layering pleural effusion. Smaller left pleural effusion is noted. No consolidative process or pneumothorax. Heart size is upper normal. IMPRESSION: ET tube in good position with the tip projecting 3.5 cm above the carina. Right greater than left pleural effusions. Electronically Signed   By: Inge Rise M.D.   On: 01/23/2015 08:19   [facility] Guide Roadmapping  01/23/2015  CLINICAL DATA:  64 year old male presents with acute life-threatening upper GI bleed secondary to an ulcerated mass encompassing the distal stomach and duodenum. 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 Endoscopic treatment options are limited and surgery is extremely high risk. Patient presents for emergent visceral arteriography and embolization. Additionally, central venous access is required. A triple-lumen central venous catheter will be placed during the course of the procedure. EXAM: SELECTIVE VISCERAL ARTERIOGRAPHY; IR EMBO ART VEN HEMORR  LYMPH EXTRAV INC GUIDE ROADMAPPING; IR LEFT FLOURO GUIDE CV LINE; ADDITIONAL ARTERIOGRAPHY; IR ULTRASOUND GUIDANCE VASC ACCESS LEFT; ARTERIOGRAPHY Date: 01/23/2015 PROCEDURE: 1. Ultrasound-guided puncture left common femoral artery 2. Catheterization of the superior mesenteric artery with arteriogram 3. Catheterization of the celiac artery with arteriogram 4. Catheterization of the gastroduodenal artery with arteriogram 5. Catheterization of the right gastroepiploic artery with arteriogram 6. Coil embolization of the gastroduodenal artery 7. Post  embolization arteriogram 8. Limited left common femoral arteriogram 9. Ultrasound-guided puncture of the left common femoral vein 10. Placement of a triple-lumen central venous catheter via the left femoral vein with fluoroscopic guidance Interventional Radiologist:  Criselda Peaches, MD ANESTHESIA/SEDATION: Moderate (conscious) sedation was used. 1 mg Versed, 50 mcg Fentanyl were administered intravenously. The patient's vital signs were monitored continuously by radiology nursing throughout the procedure. Sedation Time: 60 minutes MEDICATIONS: None additional FLUOROSCOPY TIME:  16 minutes for a total of 622 mGy CONTRAST:  154m OMNIPAQUE IOHEXOL 300 MG/ML  SOLN TECHNIQUE: Informed consent was obtained from the patient following explanation of the procedure, risks, benefits and alternatives. The patient understands, agrees and consents for the procedure. All questions were addressed. A time out was performed. Maximal barrier sterile technique utilized including caps, mask, sterile gowns, sterile gloves, large sterile drape, hand hygiene, and Betadine skin prep. The left groin was interrogated with ultrasound. The common femoral artery is found to be widely patent. An image was obtained and stored for the medical record. Local anesthesia was attained by infiltration with 1% lidocaine. Under real-time sonographic guidance, the vessel was punctured with a 21 gauge micropuncture needle. Using standard technique, the initial micro wire was exchanged through a transitional 5 [place] sheath for a working 0.035 inch Bentson wire. The micro sheath was then exchanged for a working 5 [place] sheath. A C2 cobra catheter was advanced into the abdominal aorta over the wire. The catheter was initially advanced into the superior mesenteric artery and superior mesenteric arteriogram was performed. There is conventional anatomy. No replaced or accessory right hepatic artery. No evidence of active extravasation or  significant collateralization through the pancreaticoduodenal arcade. The C2 cobra catheter was next advanced into the celiac artery. An arteriogram was performed. The gastroduodenal artery is elongated and slightly irregular in the mid segment likely secondary to external mass effect. There is no evidence of active extravasation. A renegade ST micro catheter was advanced coaxially over a fat and 16 wire. The micro catheter was advanced into the gastroduodenal artery. Arteriography was again performed. There are multiple branches arising from the gastroduodenal artery providing supply to a hypervascular mass in the region the descending duodenum. The micro catheter was carefully advanced more distally into the proximal right gastroepiploic artery. Contrast injection was performed confirming the location of the catheter tip as well as confirming no additional supplied to the region of tumor. Coil embolization was then performed of the gastroduodenal artery using a combination of vortex, standard helical and soft interlock detachable micro coils ranging in size from 2-6 mm. Post embolization arteriography confirms successful embolization of the vessel with no further visible hypervascular blush of the duodenum tumor. The micro catheter was removed. Repeat celiac artery injection was performed confirming no additional collateral supply. The 5 French catheter was readvanced of the superior mesenteric artery and an additional angiogram was performed again confirming no collateral flow to the embolized territory. The catheters were removed. The vascular sheath was left in place for hemodynamic monitoring. Ultrasound was again used to interrogate the left  groin. The left common femoral vein was identified. Local anesthesia was attained by infiltration with 1% lidocaine. The left common femoral vein was punctured with an 18 gauge needle. A wire was advanced into the inferior vena cava. The skin tract was dilated and an  arrow triple-lumen central venous catheter advanced over the wire and position with the tip at the confluence of the IVC and left common iliac vein. The catheter was flushed, capped and secured with 0 Prolene suture. Sterile bandages were applied. COMPLICATIONS: None IMPRESSION: 1. No evidence of active extravasation. 2. 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 3. Coil embolization of the gastroduodenal artery. 4. Placement of a left femoral triple-lumen central venous catheter. The catheter tip is at the confluence of the IVC and iliac vein and ready for immediate use. Signed, Criselda Peaches, MD [facility] Inter[facility] Radiology [facility] Radiology Electronically Signed   By: Jacqulynn Cadet M.D.   On: 01/23/2015 15:50   US Abdomen Limited Ruq  01/19/2015  CLINICAL DATA:  Transaminitis. History of duodenal ulcer with adherent clot. EXAM: US ABDOMEN LIMITED - RIGHT UPPER QUADRANT COMPARISON:  None. FINDINGS: Gallbladder: No gallstones or wall thickening visualized. Minimal dependent sludge. No sonographic Murphy sign noted. Small amounts of pericholecystic free fluid. Common bile duct: Diameter: 4.2 mm. Liver: Ill-defined heterogeneous echogenicity in the region of the pancreatic head and porta hepatis, borders not well-defined. Mildly heterogeneous hepatic parenchyma. No intrahepatic biliary ductal dilatation. Normal directional flow in the main portal vein. Right pleural effusion, incidentally noted. IMPRESSION: 1. Heterogeneous echogenicity in the region of the porta hepatis and pancreatic head, difficult to separate from the adjacent liver parenchyma. This may reflect hematoma/blood products in the setting of bleeding duodenal ulcer versus pancreatic head or focal liver lesion. There is no biliary ductal dilatation. Recommend further characterization with cross-sectional imaging, contrast-enhanced  CT or MRI. 2. Minimal sludge in the gallbladder, no wall thickening or gallstones. Small amount of pericholecystic fluid. No sonographic Murphy sign or findings of acute cholecystitis. Electronically Signed   By: Jeb Levering M.D.   On: 01/19/2015 20:48    Assessment: 64 y.o. 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 He is down for port and bone marrow biopsy today.  He is very anxious to get home for the holidays--his mother will be visiting from out of town, he misses huis granddaughter, and in general he wanted to make sure I let the team know (I am sure the team is aware!).  I explained why we are concerned about his eating and the possibility of perforation. Perhaps a repeat EGD would help clarify the current status of his ulcerated lesion.  From my point of view we will be starting his lymphoma treatment 02/08/2014-- he needs to be at the cancer center 8:15 AM that day. I have nothing else to contribute at this point and will be out of town next week. Please consult one of my partners if we can be of further help.  Thank you for your great care of this patient!   Chauncey Cruel, MD 01/26/2015  7:49 AM Medical Oncology and Hematology [facility] [address] [place], [place] [address] Tel. [phone]    Fax. [phone]

## 2015-01-26 NOTE — Progress Notes (Signed)
CRITICAL VALUE ALERT  Critical value received:  Hgb=6.6  Date of notification:  01/26/15  Time of notification:  [id number]  Critical value read back:Yes.    Nurse who received alert:  Enedina Pair   MD notified (1st page): [place]  Time of first page: 0449  MD notified (2nd page): N/A  Time of second page: N/A  Responding MD:  [place]  Time MD responded:  [phone]

## 2015-01-27 LAB — COMPREHENSIVE METABOLIC PANEL
ALK PHOS: 55 U/L (ref 38–126)
ALT: 23 U/L (ref 17–63)
AST: 24 U/L (ref 15–41)
Albumin: 2.5 g/dL — ABNORMAL LOW (ref 3.5–5.0)
Anion gap: 9 (ref 5–15)
BILIRUBIN TOTAL: 0.6 mg/dL (ref 0.3–1.2)
BUN: 9 mg/dL (ref 6–20)
CO2: 24 mmol/L (ref 22–32)
CREATININE: 1.09 mg/dL (ref 0.61–1.24)
Calcium: 8.1 mg/dL — ABNORMAL LOW (ref 8.9–10.3)
Chloride: 106 mmol/L (ref 101–111)
GFR calc Af Amer: >60 mL/min (ref >60)
Glucose, Bld: 84 mg/dL (ref 65–99)
POTASSIUM: 3.7 mmol/L (ref 3.5–5.1)
Sodium: 139 mmol/L (ref 135–145)
Total Protein: 5.4 g/dL — ABNORMAL LOW (ref 6.5–8.1)

## 2015-01-27 LAB — GLUCOSE, CAPILLARY
GLUCOSE-CAPILLARY: 210 mg/dL — AB (ref 65–99)
GLUCOSE-CAPILLARY: 86 mg/dL (ref 65–99)
GLUCOSE-CAPILLARY: 126 mg/dL — AB (ref 65–99)
Glucose-Capillary: 83 mg/dL (ref 65–99)
Glucose-Capillary: 67 mg/dL (ref 65–99)

## 2015-01-27 LAB — TYPE AND SCREEN
ABO/RH(D): AB POS
ANTIBODY SCREEN: NEGATIVE
UNIT DIVISION: 0
Unit division: 0

## 2015-01-27 LAB — CBC
HEMATOCRIT: 26.5 % — AB (ref 39.0–52.0)
HEMOGLOBIN: 9 g/dL — AB (ref 13.0–17.0)
MCH: 28.7 pg (ref 26.0–34.0)
MCHC: 34 g/dL (ref 30.0–36.0)
MCV: 84.4 fL (ref 78.0–100.0)
Platelets: 264 10*3/uL (ref 150–400)
RBC: 3.14 MIL/uL — ABNORMAL LOW (ref 4.22–5.81)
RDW: 15.6 % — AB (ref 11.5–15.5)
WBC: 8.4 10*3/uL (ref 4.0–10.5)

## 2015-01-27 MED ORDER — SUCRALFATE 1 G PO TABS: 1.0000 g | ORAL_TABLET | Freq: Three times a day (TID) | ORAL | Status: DC

## 2015-01-27 MED ORDER — HEPARIN SOD (PORK) LOCK FLUSH 100 UNIT/ML IV SOLN: 500.0000 [IU] | INTRAVENOUS | Status: DC | PRN

## 2015-01-27 NOTE — Progress Notes (Signed)
Pt to d/c with portacath. IV team verified. Reviewed d/c instructions w/Pt & family, they indicated understanding.

## 2015-01-27 NOTE — Discharge Summary (Signed)
DISCHARGE SUMMARY  HILLARD GOODWINE  MR#: [id number]  DOB:July 23, 1950  Date of Admission: 01/23/2015 Date of Discharge: 01/27/2015  Attending Physician:Jacobi Nile T  Patient's PCP:No primary care provider on file.  Consults: PCCM Gen Surgery [facility] - [facility]  IR Heme/Onc  Disposition: D/C home   Follow-up Appts:     Follow-up Information    Follow up with Lowella Dell, MD On 02/09/2015.   Specialty:  Oncology   Why:  At 8:15AM    Contact information:   [address] [place] [address] [phone]       Follow up with Interventional Radiology .   Why:  The Radiology Department should be calling you to arrange for an outpatient bone marrow biopsy.        Follow up with Your Primary Care Doctor In 5 days.   Why:  See your Primary Care Doctor to have your hemaglobin (blood level) checked in 5-7 days, and for a blood pressure check       Follow up with [facility] On 02/01/2015.   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 Wife indicated patient had an uneventful night after his lymph node biopsy that was done on 12/19. Per patient's wife, patient complained of abdominal fullness and nausea. He went to the bathroom, felt extremely dizzy, and fell on the floor. She assisted him out of the bathroom. EMS was called and upon EMS arrival, patient was very SOB and combative. He was placed on a  non-rebreather mask and transferred to the ED.   At the ED, patient decompensated requiring intubation. An NG tube was draining bright red blood. Patient was recently diagnosed with a large duodenal mass and had a right inguinal lymph node biopsy on 12/19. He had an EGD on 12/15 and a CT abdomen on 12/14 that showed a massive duodenal bulb ulcer with adherent clot, an ulcerated pyloric channel, and lymphadenopathy with great suspicion for lymphoma.   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 - Hemoglobin had been stable at 7.1 but fell to 6.6 the morning of 12/23 - no other signs of ongoing bleeding were evident - felt to be high risk for rebleed - transfused another 2U PRBC 12/23, for a total of 5U PRBC this admit - f/u Hgb appropriately improved - to continue full liquid diet at home for 2 more days, then advance to regular diet - pt counseled on warning signs of recurrent bleeding, and need to return to the ED immediately should these signs occur  Mantle cell lymphoma EGD 12/15 and R inguinal lymph node bx 12/19 during prior admit - followed by Oncology who is recommending chemotherapy which will be done in the outpatient setting - TTE completed - bone marrow biopsy ordered, but could not be accomplished earlier than 12/27 due  to scheduling w/ IR - will ask IR to contact pt to schedule this as an outpt    Acute renal failure secondary to prerenal azotemia - resolved w/ transfusion/volume expansion   Hypertension BP control improved at time of d/c - pt was to be prescribed BP meds via his PCP, and intends to visit his PCP next week - I will therefore not initiate a new med at this time, but have asked him to make sure to keep this appointment for a f/u   Procedures: Coil embolization of gastroduodenal artery per Dr. Geroge Baseman 01/23/2015 ETT 01/23/2015 >  01/24/2015 TTE 12/22 > EF 55-60% - no WMA - grade 1 DD    Medication List    TAKE these medications        omeprazole 40 MG capsule  Commonly known as:  PRILOSEC  Take 1 capsule (40 mg total) by mouth 2 (two) times daily.     sucralfate 1 G tablet  Commonly known as:  CARAFATE  Take 1 tablet (1 g total) by mouth 4 (four) times daily -  with meals and at bedtime.        Day of Discharge BP 128/92 mmHg  Pulse 75  Temp(Src) 98.2 F (36.8 C) (Oral)  Resp 10  Ht '5\' 10"'$  (1.778 m)  Wt 98.5 kg (217 lb 2.5 oz)  BMI 31.16 kg/m2  SpO2 97%  Physical Exam: General: No acute respiratory distress Lungs: Clear to auscultation bilaterally without wheezes or crackles Cardiovascular: Regular rate and rhythm without murmur gallop or rub normal S1 and S2 Abdomen: Nontender, nondistended, soft, bowel sounds positive, no rebound, no ascites, no appreciable mass Extremities: No significant cyanosis, clubbing, or edema bilateral lower extremities  Basic Metabolic Panel:  Recent Labs Lab 01/23/15 0751 01/23/15 0812 01/24/15 0430 01/25/15 0355 01/26/15 0415 01/27/15 0544  NA 147* 142 141 141 139 139  K 5.7* 5.5* 4.3 4.1 3.9 3.7  CL 111 113* 114* 113* 109 106  CO2 8*  --  21* '23 24 24  '$ GLUCOSE 359* 328* 142* 112* 103* 84  BUN 11 13 21* '19 10 9  '$ CREATININE 1.71* 1.50* 1.65* 1.21 0.99 1.09  CALCIUM 9.2  --  6.7* 7.4* 7.7* 8.1*  MG  --   --  1.8 2.1 1.8  --   PHOS  --   --  4.6 3.0 2.5  --     Liver Function Tests:  Recent Labs Lab 01/21/15 0422 01/23/15 0751 01/24/15 0430 01/27/15 0544  AST 23 44* 39 24  ALT 50 37 37 23  ALKPHOS 64 67 61 55  BILITOT 0.5 0.2* 0.5 0.6  PROT 5.3* 4.5* 4.1* 5.4*  ALBUMIN 2.5* 2.3* 2.0* 2.5*    Recent Labs Lab 01/23/15 0800  AMMONIA 336*    Coags:  Recent Labs Lab 01/23/15 0751 01/25/15 0355  INR 1.53* 1.24    CBC:  Recent Labs Lab 01/25/15 0355 01/25/15 0830 01/25/15 1615 01/26/15 0415 01/26/15 1834 01/27/15 0544  WBC  10.5 11.1*  --  8.9 10.3 8.4  NEUTROABS 8.9*  --   --  7.2  --   --   HGB 7.1* 7.1* 7.2* 6.6* 9.2* 9.0*  HCT 20.9* 21.8* 21.8* 20.0* 27.3* 26.5*  MCV 85.7 86.2  --  85.1 84.8 84.4  PLT 200 231  --  228 274 264    CBG:  Recent Labs Lab 01/26/15 2022 01/26/15 2317 01/26/15 2328 01/27/15 0313 01/27/15 0832  GLUCAP 104* 210* 83 86 67  Recent Results (from the past 240 hour(s))  MRSA PCR Screening     Status: None   Collection Time: 01/17/15  9:07 PM  Result Value Ref Range Status   MRSA by PCR NEGATIVE NEGATIVE Final    Comment:        The GeneXpert MRSA Assay (FDA approved for NASAL specimens only), is one component of a comprehensive MRSA colonization surveillance program. It is not intended to diagnose MRSA infection nor to guide or monitor treatment for MRSA infections.   Surgical pcr screen     Status: Abnormal   Collection Time: 01/22/15  3:10 AM  Result Value Ref Range Status   MRSA, PCR NEGATIVE NEGATIVE Final   Staphylococcus aureus POSITIVE (A) NEGATIVE Final    Comment:        The Xpert SA Assay (FDA approved for NASAL specimens in patients over 69 years of age), is one component of a comprehensive surveillance program.  Test performance has been validated by [facility] for patients greater than or equal to 69 year old. It is not intended to diagnose infection nor to guide or monitor treatment.   Culture, blood (routine x 2)     Status: None (Preliminary result)   Collection Time: 01/23/15  8:00 AM  Result Value Ref Range Status   Specimen Description BLOOD RIGHT ANTECUBITAL  Final   Special Requests BOTTLES DRAWN AEROBIC AND ANAEROBIC 5MLS  Final   Culture NO GROWTH 3 DAYS  Final   Report Status PENDING  Incomplete  Urine culture     Status: None   Collection Time: 01/23/15  8:05 AM  Result Value Ref Range Status   Specimen Description URINE, CATHETERIZED  Final   Special Requests Normal  Final   Culture NO GROWTH 1 DAY  Final   Report  Status 01/24/2015 FINAL  Final  Culture, blood (routine x 2)     Status: None (Preliminary result)   Collection Time: 01/23/15  4:33 PM  Result Value Ref Range Status   Specimen Description BLOOD RIGHT HAND  Final   Special Requests BOTTLES DRAWN AEROBIC ONLY 4CC  Final   Culture NO GROWTH 3 DAYS  Final   Report Status PENDING  Incomplete  Urine culture     Status: None   Collection Time: 01/24/15  9:00 PM  Result Value Ref Range Status   Specimen Description URINE, CLEAN CATCH  Final   Special Requests NONE  Final   Culture NO GROWTH 2 DAYS  Final   Report Status 01/26/2015 FINAL  Final      Time spent in discharge (includes decision making & examination of pt): >35 minutes  01/27/2015, 10:25 AM   Cherene Altes, MD [facility] Office  [phone] Pager [phone]  On-Call/Text Page:      [email or web address]      password [facility]

## 2015-01-27 NOTE — Discharge Instructions (Signed)
You should continue a full liquid diet for 2 additional days, then resume your usual diet as tolerated, beginning with soft solids initially, then working your way back toward regular consistencies.    Return to the ED immediately if you develop severe obvious bleeding, dizziness or lightheadedness, or severe weakness/lethargy.    Full Liquid Diet A full liquid diet may be used:   To help you transition from a clear liquid diet to a soft diet.   When your body is healing and can only tolerate foods that are easy to digest.  Before or after certain a procedure, test, or surgery (such as stomach or intestinal surgeries).   If you have trouble swallowing or chewing.  A full liquid diet includes fluids and foods that are liquid or will become liquid at room temperature. The full liquid diet gives you the proteins, fluids, salts, and minerals that you need for energy. If you continue this diet for more than 72 hours, talk to your health care provider about how many calories you need to consume. If you continue the diet for more than 5 days, talk to your health care provider about taking a multivitamin or a nutritional supplement. WHAT DO I NEED TO KNOW ABOUT A FULL LIQUID DIET?  You may have any liquid.  You may have any food that becomes a liquid at room temperature. The food is considered a liquid if it can be poured off a spoon at room temperature.  Drink one serving of citrus or vitamin C-enriched fruit juice daily. WHAT FOODS CAN I EAT? Grains Any grain food that can be pureed in soup (such as crackers, pasta, and rice). Hot cereal (such as farina or oatmeal) that has been blended. Talk to your health care provider or dietitian about these foods. Vegetables Pulp-free tomato or vegetable juice. Vegetables pureed in soup.  Fruits Fruit juice, including nectars and juices with pulp. Meats and Other Protein Sources Eggs in custard, eggnog mix, and eggs used in ice cream or pudding.  Strained meats, like in baby food, may be allowed. Consult your health care provider.  Dairy Milk and milk-based beverages, including milk shakes and instant breakfast mixes. Smooth yogurt. Pureed cottage cheese. Avoid these foods if they are not well tolerated. Beverages All beverages, including liquid nutritional supplements. Ask your health care provider if you can have carbonated beverages. They may not be well tolerated. Condiments Iodized salt, pepper, spices, and flavorings. Cocoa powder. Vinegar, ketchup, yellow mustard, smooth sauces (such as hollandaise, cheese sauce, or white sauce), and soy sauce. Sweets and Desserts Custard, smooth pudding. Flavored gelatin. Tapioca, junket. Plain ice cream, sherbet, fruit ices. Frozen ice pops, frozen fudge pops, pudding pops, and other frozen bars with cream. Syrups, including chocolate syrup. Sugar, honey, jelly.  Fats and Oils Margarine, butter, cream, sour cream, and oils. Other Broth and cream soups. Strained, broth-based soups. The items listed above may not be a complete list of recommended foods or beverages. Contact your dietitian for more options.  WHAT FOODS CAN I NOT EAT? Grains All breads. Grains are not allowed unless they are pureed into soup. Vegetables Vegetables are not allowed unless they are juiced, or cooked and pureed into soup. Fruits Fruits are not allowed unless they are juiced. Meats and Other Protein Sources Any meat or fish. Cooked or raw eggs. Nut butters.  Dairy Cheese.  Condiments Stone ground mustards. Fats and Oils Fats that are coarse or chunky. Sweets and Desserts Ice cream or other frozen desserts that  have any solids in them or on top, such as nuts, chocolate chips, and pieces of cookies. Cakes. Cookies. Candy. Others Soups with chunks or pieces in them. The items listed above may not be a complete list of foods and beverages to avoid. Contact your dietitian for more information.   This information  is not intended to replace advice given to you by your health care provider. Make sure you discuss any questions you have with your health care provider.   Document Released: 01/20/2005 Document Revised: 01/25/2013 Document Reviewed: 11/25/2012 Elsevier Interactive Patient Education 2016 Elsevier Inc.   Gastrointestinal Bleeding  Gastrointestinal bleeding is bleeding somewhere along the path that food travels through the body (digestive tract). This path is anywhere between the mouth and the opening of the butt (anus). You may have blood in your throw up (vomit) or in your poop (stools). If there is a lot of bleeding, you may need to stay in the hospital. [place]  Only take medicine as told by your doctor.  Eat foods with fiber such as whole grains, fruits, and vegetables. You can also try eating 1 to 3 prunes a day.  Drink enough fluids to keep your pee (urine) clear or pale yellow. GET HELP RIGHT AWAY IF:   Your bleeding gets worse.  You feel dizzy, weak, or you pass out (faint).  You have bad cramps in your back or belly (abdomen).  You have large blood clumps (clots) in your poop.  Your problems are getting worse. MAKE SURE YOU:   Understand these instructions.  Will watch your condition.  Will get help right away if you are not doing well or get worse.   This information is not intended to replace advice given to you by your health care provider. Make sure you discuss any questions you have with your health care provider.   Document Released: 10/30/2007 Document Revised: 01/07/2012 Document Reviewed: 07/10/2014 Elsevier Interactive Patient Education [employer].

## 2015-01-27 NOTE — Progress Notes (Signed)
[facility] PROGRESS NOTE Subjective patient's hemoglobin is stable and he is tolerating diet. He is to follow-up with Dr Griffith Citron  for treatment of his gastric lymphoma in the near future  Objective: Vital signs in last 24 hours: Temp:  [97.8 F (36.6 C)-98.2 F (36.8 C)] 98.2 F (36.8 C) (12/24 0700) Pulse Rate:  [60-76] 75 (12/24 0314) Resp:  [10-22] 10 (12/24 0314) BP: (125-151)/(71-110) 151/101 mmHg (12/24 0700) SpO2:  [96 %-100 %] 97 % (12/24 0314) Weight:  [98.5 kg (217 lb 2.5 oz)] 98.5 kg (217 lb 2.5 oz) (12/24 0500) Last BM Date: 01/24/15  Intake/Output from previous day: 12/23 0701 - 12/24 0700 In: 3338.3 [P.O.:1020; I.V.:1598.3; Blood:670; IV Piggyback:50] Out: 2975 [Urine:2975] Intake/Output this shift:    PE: General-- patient and no obvious distress  Abdomen-- soft and nontender  Lab Results:  Recent Labs  01/25/15 0355 01/25/15 0830 01/25/15 1615 01/26/15 0415 01/26/15 1834 01/27/15 0544  WBC 10.5 11.1*  --  8.9 10.3 8.4  HGB 7.1* 7.1* 7.2* 6.6* 9.2* 9.0*  HCT 20.9* 21.8* 21.8* 20.0* 27.3* 26.5*  PLT 200 231  --  228 274 264   BMET  Recent Labs  01/25/15 0355 01/26/15 0415 01/27/15 0544  NA 141 139 139  K 4.1 3.9 3.7  CL 113* 109 106  CO2 23 24 24   CREATININE 1.21 0.99 1.09   LFT  Recent Labs  01/27/15 0544  PROT 5.4*  AST 24  ALT 23  ALKPHOS 55  BILITOT 0.6   PT/INR  Recent Labs  01/25/15 0355  LABPROT 15.8*  INR 1.24   PANCREAS No results for input(s): LIPASE in the last 72 hours.       Studies/Results: Ir Fluoro Guide Cv Line Right  01/26/2015  CLINICAL DATA:  MANTLE CELL LYMPHOMA, ACCESS FOR CHEMOTHERAPY EXAM: RIGHT INTERNAL JUGULAR SINGLE LUMEN POWER PORT CATHETER INSERTION Date:  12/23/201612/23/2016 11:02 am Radiologist:  M. Daryll Brod, MD Guidance:  Ultrasound and fluoroscopic FLUOROSCOPY TIME:  48 seconds, for mGy MEDICATIONS AND MEDICAL HISTORY: 2 g AncefAdministered within 1 hour of the  procedure.2 mg Versed, 100 mcg fentanyl ANESTHESIA/SEDATION: 30 minutes CONTRAST:  None COMPLICATIONS: None immediate PROCEDURE: Informed consent was obtained from the patient following explanation of the procedure, risks, benefits and alternatives. The patient understands, agrees and consents for the procedure. All questions were addressed. A time out was performed. Maximal barrier sterile technique utilized including caps, mask, sterile gowns, sterile gloves, large sterile drape, hand hygiene, and 2% chlorhexidine scrub. Under sterile conditions and local anesthesia, right internal jugular micropuncture venous access was performed. Access was performed with ultrasound. Images were obtained for documentation. A guide wire was inserted followed by a transitional dilator. This allowed insertion of a guide wire and catheter into the IVC. Measurements were obtained from the SVC / RA junction back to the right IJ venotomy site. In the right infraclavicular chest, a subcutaneous pocket was created over the second anterior rib. This was done under sterile conditions and local anesthesia. 1% lidocaine with epinephrine was utilized for this. A 2.5 cm incision was made in the skin. Blunt dissection was performed to create a subcutaneous pocket over the right pectoralis major muscle. The pocket was flushed with saline vigorously. There was adequate hemostasis. The port catheter was assembled and checked for leakage. The port catheter was secured in the pocket with two retention sutures. The tubing was tunneled subcutaneously to the right venotomy site and inserted into the SVC/RA junction through a valved peel-away sheath. Position  was confirmed with fluoroscopy. Images were obtained for documentation. The patient tolerated the procedure well. No immediate complications. Incisions were closed in a two layer fashion with 4 - 0 Vicryl suture. Dermabond was applied to the skin. The port catheter was accessed, blood was aspirated  followed by saline and heparin flushes. Needle was removed. A dry sterile dressing was applied. IMPRESSION: Ultrasound and fluoroscopically guided right internal jugular single lumen power port catheter insertion. Tip in the SVC/RA junction. Catheter ready for use. Electronically Signed   By: Jerilynn Mages.  Shick M.D.   On: 01/26/2015 11:25   Ir US Guide Vasc Access Right  01/26/2015  CLINICAL DATA:  MANTLE CELL LYMPHOMA, ACCESS FOR CHEMOTHERAPY EXAM: RIGHT INTERNAL JUGULAR SINGLE LUMEN POWER PORT CATHETER INSERTION Date:  12/23/201612/23/2016 11:02 am Radiologist:  M. Daryll Brod, MD Guidance:  Ultrasound and fluoroscopic FLUOROSCOPY TIME:  48 seconds, for mGy MEDICATIONS AND MEDICAL HISTORY: 2 g AncefAdministered within 1 hour of the procedure.2 mg Versed, 100 mcg fentanyl ANESTHESIA/SEDATION: 30 minutes CONTRAST:  None COMPLICATIONS: None immediate PROCEDURE: Informed consent was obtained from the patient following explanation of the procedure, risks, benefits and alternatives. The patient understands, agrees and consents for the procedure. All questions were addressed. A time out was performed. Maximal barrier sterile technique utilized including caps, mask, sterile gowns, sterile gloves, large sterile drape, hand hygiene, and 2% chlorhexidine scrub. Under sterile conditions and local anesthesia, right internal jugular micropuncture venous access was performed. Access was performed with ultrasound. Images were obtained for documentation. A guide wire was inserted followed by a transitional dilator. This allowed insertion of a guide wire and catheter into the IVC. Measurements were obtained from the SVC / RA junction back to the right IJ venotomy site. In the right infraclavicular chest, a subcutaneous pocket was created over the second anterior rib. This was done under sterile conditions and local anesthesia. 1% lidocaine with epinephrine was utilized for this. A 2.5 cm incision was made in the skin. Blunt dissection  was performed to create a subcutaneous pocket over the right pectoralis major muscle. The pocket was flushed with saline vigorously. There was adequate hemostasis. The port catheter was assembled and checked for leakage. The port catheter was secured in the pocket with two retention sutures. The tubing was tunneled subcutaneously to the right venotomy site and inserted into the SVC/RA junction through a valved peel-away sheath. Position was confirmed with fluoroscopy. Images were obtained for documentation. The patient tolerated the procedure well. No immediate complications. Incisions were closed in a two layer fashion with 4 - 0 Vicryl suture. Dermabond was applied to the skin. The port catheter was accessed, blood was aspirated followed by saline and heparin flushes. Needle was removed. A dry sterile dressing was applied. IMPRESSION: Ultrasound and fluoroscopically guided right internal jugular single lumen power port catheter insertion. Tip in the SVC/RA junction. Catheter ready for use. Electronically Signed   By: Jerilynn Mages.  Shick M.D.   On: 01/26/2015 11:25    Medications: I have reviewed the patient's current medications.  Assessment/Plan: 1. Bleeding gastric lymphoma. Has been embolized and does seem to be stable current time. He has apparently been discharged. Spent some time discussing with him the need to eat a very low residue diet with no raw vegetables and soft foods only. He indicated an understanding of [place]. Would discharge him home on carafate in BID PPI.   Akeiba Axelson JR,Inez Stantz L 01/27/2015, 10:48 AM  Pager: [phone] If no answer or after hours call [phone]

## 2015-01-28 LAB — CULTURE, BLOOD (ROUTINE X 2)
Culture: NO GROWTH
Culture: NO GROWTH

## 2015-01-30 ENCOUNTER — Other Ambulatory Visit: Payer: Self-pay | Admitting: Internal Medicine

## 2015-01-30 DIAGNOSIS — C8310 Mantle cell lymphoma, unspecified site: Secondary | ICD-10-CM

## 2015-02-01 ENCOUNTER — Other Ambulatory Visit: Payer: Self-pay | Admitting: *Deleted

## 2015-02-01 ENCOUNTER — Other Ambulatory Visit: Payer: BLUE CROSS/BLUE SHIELD

## 2015-02-01 ENCOUNTER — Telehealth: Payer: Self-pay | Admitting: *Deleted

## 2015-02-01 ENCOUNTER — Encounter: Payer: Self-pay | Admitting: *Deleted

## 2015-02-01 ENCOUNTER — Encounter: Payer: Self-pay | Admitting: Oncology

## 2015-02-01 DIAGNOSIS — E785 Hyperlipidemia, unspecified: Secondary | ICD-10-CM

## 2015-02-01 DIAGNOSIS — C8319 Mantle cell lymphoma, extranodal and solid organ sites: Secondary | ICD-10-CM

## 2015-02-01 DIAGNOSIS — K922 Gastrointestinal hemorrhage, unspecified: Secondary | ICD-10-CM

## 2015-02-01 DIAGNOSIS — D62 Acute posthemorrhagic anemia: Secondary | ICD-10-CM

## 2015-02-01 DIAGNOSIS — I10 Essential (primary) hypertension: Secondary | ICD-10-CM

## 2015-02-01 DIAGNOSIS — J9859 Other diseases of mediastinum, not elsewhere classified: Secondary | ICD-10-CM

## 2015-02-01 DIAGNOSIS — R1900 Intra-abdominal and pelvic swelling, mass and lump, unspecified site: Secondary | ICD-10-CM

## 2015-02-01 MED ORDER — PROCHLORPERAZINE MALEATE 10 MG PO TABS: 10.0000 mg | ORAL_TABLET | Freq: Four times a day (QID) | ORAL | Status: DC | PRN

## 2015-02-01 MED ORDER — ALLOPURINOL 300 MG PO TABS: 300.0000 mg | ORAL_TABLET | Freq: Every day | ORAL | Status: DC

## 2015-02-01 MED ORDER — ONDANSETRON HCL 8 MG PO TABS: 8.0000 mg | ORAL_TABLET | Freq: Two times a day (BID) | ORAL | Status: DC

## 2015-02-01 MED ORDER — PREDNISONE 20 MG PO TABS
ORAL_TABLET | ORAL | Status: DC
Start: 2015-02-01 — End: 2015-02-06

## 2015-02-01 MED ORDER — LORAZEPAM 0.5 MG PO TABS: 0.5000 mg | ORAL_TABLET | Freq: Four times a day (QID) | ORAL | Status: DC | PRN

## 2015-02-01 NOTE — Progress Notes (Signed)
Pt came in office today regarding financial concerns about bills. Advised pt in order to make arrangements he would need to contact the number on the bills as soon as early as possible to avoid further collections. Pt also was looking for STD paperwork that was sent from [facility] over here for Dr.Magrinat to complete. Spoke with Valerie(RN) who states she has not seen paperwork. Also asked Raquel if it had been placed in her box and she states no. Patient needs this complete so that he may receive paycheck. Advised patient to contact the RN whom he states was faxing it over and have them send directly to Raquel. Patient also had a form for insurance to cover his mortgage during this treatment. Asked Raquel if she handles that and she said to place in her box. Left number to call patient when forms are ready for pickup. Patient will also be on Neulasta. Enrolled in [place] for Neulasta. Approved patient for [facility] and explained how it works. Patient states he does not have any OOP meds from [facility] as of now. Copy of grant and expense form given to patient.Patient's spouse states they had to make payment arrangements for light bill. Advised if they needed assistance, they can bring in the bill to be paid from grant. Patient has my card for any additional financial questions or concerns.

## 2015-02-02 ENCOUNTER — Encounter: Payer: Self-pay | Admitting: Oncology

## 2015-02-02 ENCOUNTER — Other Ambulatory Visit: Payer: Self-pay | Admitting: Radiology

## 2015-02-02 NOTE — Progress Notes (Signed)
I placed [employer] and merit life forms for dr. Jana Hakim. sharepoint noted

## 2015-02-06 ENCOUNTER — Encounter (HOSPITAL_COMMUNITY): Payer: Self-pay

## 2015-02-06 ENCOUNTER — Ambulatory Visit (HOSPITAL_COMMUNITY)
Admission: RE | Admit: 2015-02-06 | Discharge: 2015-02-06 | Disposition: A | Discharge status: Home / Self Care | Payer: BLUE CROSS/BLUE SHIELD | Source: Ambulatory Visit | Attending: Internal Medicine | Admitting: Internal Medicine

## 2015-02-06 ENCOUNTER — Other Ambulatory Visit: Payer: Self-pay | Admitting: *Deleted

## 2015-02-06 DIAGNOSIS — I10 Essential (primary) hypertension: Secondary | ICD-10-CM

## 2015-02-06 DIAGNOSIS — C8310 Mantle cell lymphoma, unspecified site: Secondary | ICD-10-CM | POA: Insufficient documentation

## 2015-02-06 DIAGNOSIS — K922 Gastrointestinal hemorrhage, unspecified: Secondary | ICD-10-CM | POA: Diagnosis not present

## 2015-02-06 DIAGNOSIS — E785 Hyperlipidemia, unspecified: Secondary | ICD-10-CM

## 2015-02-06 DIAGNOSIS — D62 Acute posthemorrhagic anemia: Secondary | ICD-10-CM

## 2015-02-06 DIAGNOSIS — R1900 Intra-abdominal and pelvic swelling, mass and lump, unspecified site: Secondary | ICD-10-CM

## 2015-02-06 DIAGNOSIS — C8319 Mantle cell lymphoma, extranodal and solid organ sites: Secondary | ICD-10-CM

## 2015-02-06 DIAGNOSIS — K264 Chronic or unspecified duodenal ulcer with hemorrhage: Secondary | ICD-10-CM | POA: Diagnosis not present

## 2015-02-06 DIAGNOSIS — J9859 Other diseases of mediastinum, not elsewhere classified: Secondary | ICD-10-CM

## 2015-02-06 DIAGNOSIS — C8128 Mixed cellularity classical Hodgkin lymphoma, lymph nodes of multiple sites: Secondary | ICD-10-CM

## 2015-02-06 HISTORY — DX: Gastro-esophageal reflux disease without esophagitis: K21.9

## 2015-02-06 HISTORY — DX: Gastrointestinal hemorrhage, unspecified: K92.2

## 2015-02-06 HISTORY — DX: Anemia, unspecified: D64.9

## 2015-02-06 HISTORY — DX: Weakness: R53.1

## 2015-02-06 HISTORY — DX: Malignant (primary) neoplasm, unspecified: C80.1

## 2015-02-06 LAB — APTT: APTT: 31 s (ref 24–37)

## 2015-02-06 LAB — CBC
HCT: 29.2 % — ABNORMAL LOW (ref 39.0–52.0)
Hemoglobin: 9.4 g/dL — ABNORMAL LOW (ref 13.0–17.0)
MCH: 27.9 pg (ref 26.0–34.0)
MCHC: 32.2 g/dL (ref 30.0–36.0)
MCV: 86.6 fL (ref 78.0–100.0)
Platelets: 556 10*3/uL — ABNORMAL HIGH (ref 150–400)
RBC: 3.37 MIL/uL — ABNORMAL LOW (ref 4.22–5.81)
RDW: 15 % (ref 11.5–15.5)
WBC: 7.8 10*3/uL (ref 4.0–10.5)

## 2015-02-06 LAB — PROTIME-INR
INR: 1.03 (ref 0.00–1.49)
Prothrombin Time: 13.7 seconds (ref 11.6–15.2)

## 2015-02-06 LAB — BONE MARROW EXAM

## 2015-02-06 IMAGING — CT CT BIOPSY
1 of 5 series · 2 of 10 positions shown, 5 images · non-contrast
Comparison: none

INDICATION: Lymphoma. Please perform CT-guided bone marrow biopsy for tissue
diagnostic purposes.

[Series 3: add scan 5.0 b70f · axial · 0.58mm/px · z∈[-36,-30]mm · 2 of 2 slices shown, 5 images]
[im 1/2  soft-tissue]
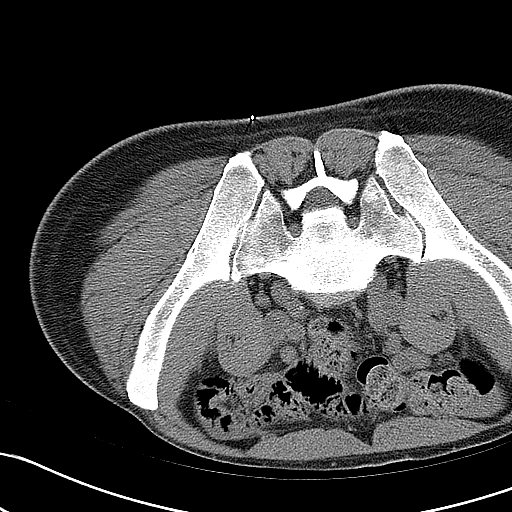
[im 1/2  lung]
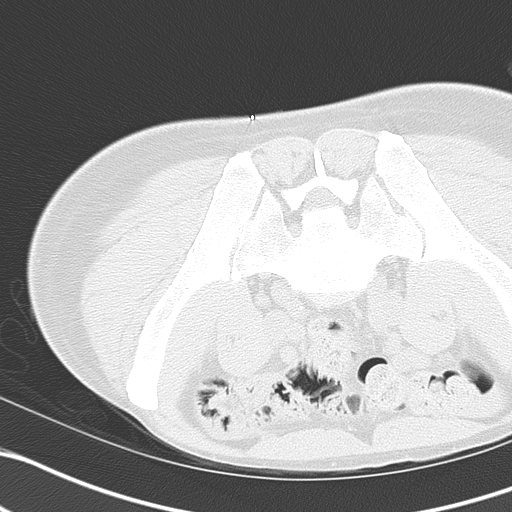
[im 1/2  bone]
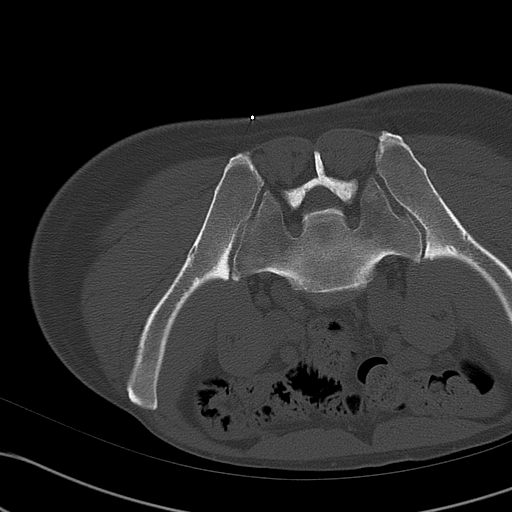
[im 2/2  soft-tissue]
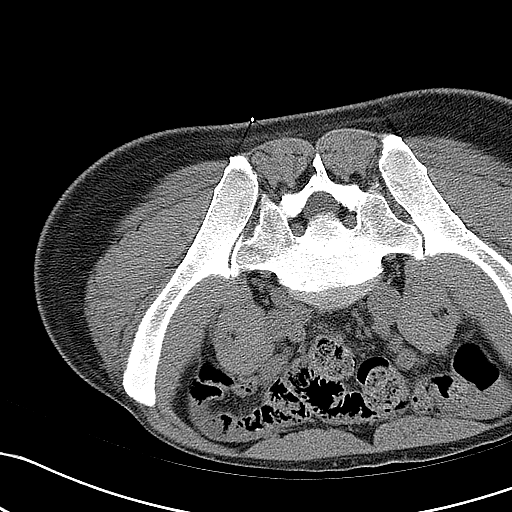
[im 2/2  lung]
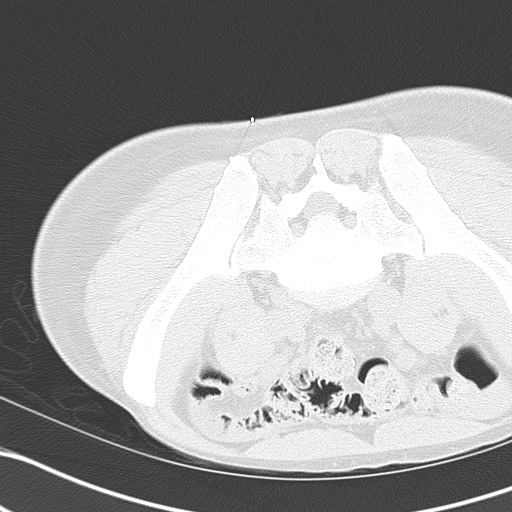

[2 of 10 positions shown; findings below may reference images not displayed]

EXAM:
CT-GUIDED BONE MARROW BIOPSY AND ASPIRATION

MEDICATIONS:
Fentanyl 75 mcg IV; Versed 1.5 mg IV

ANESTHESIA/SEDATION:
Sedation Time

5 minutes

CONTRAST:  None

COMPLICATIONS:
None immediate.

PROCEDURE:
Informed consent was obtained from the patient following an
explanation of the procedure, risks, benefits and alternatives. The
patient understands, agrees and consents for the procedure. All
questions were addressed. A time out was performed prior to the
initiation of the procedure. The patient was positioned prone and
non-contrast localization CT was performed of the pelvis to
demonstrate the iliac marrow spaces. The operative site was prepped
and draped in the usual sterile fashion.

Under sterile conditions and local anesthesia, a 22 gauge spinal
needle was utilized for procedural planning. Next, an 11 gauge
coaxial bone biopsy needle was advanced into the right iliac marrow
space. Needle position was confirmed with CT imaging. Initially,
bone marrow aspiration was performed. Next, a bone marrow biopsy was
obtained with the 11 gauge outer bone marrow device. Samples were
prepared with the cytotechnologist and deemed adequate. The needle
was removed intact. Hemostasis was obtained with compression and a
dressing was placed. The patient tolerated the procedure well
without immediate post procedural complication.
IMPRESSION: Successful CT guided right iliac bone marrow aspiration and core
biopsy.

## 2015-02-06 MED ORDER — FENTANYL CITRATE (PF) 100 MCG/2ML IJ SOLN
INTRAMUSCULAR | Status: AC | PRN
  Administered 2015-02-06: 25 ug via INTRAVENOUS
  Administered 2015-02-06: 50 ug via INTRAVENOUS

## 2015-02-06 MED ORDER — MIDAZOLAM HCL 2 MG/2ML IJ SOLN
INTRAMUSCULAR | Status: AC | PRN
  Administered 2015-02-06: 1 mg via INTRAVENOUS
  Administered 2015-02-06: 0.5 mg via INTRAVENOUS

## 2015-02-06 MED ORDER — ONDANSETRON HCL 8 MG PO TABS: 8.0000 mg | ORAL_TABLET | Freq: Two times a day (BID) | ORAL | Status: DC

## 2015-02-06 MED ORDER — PROCHLORPERAZINE MALEATE 10 MG PO TABS: 10.0000 mg | ORAL_TABLET | Freq: Four times a day (QID) | ORAL | Status: DC | PRN

## 2015-02-06 MED ORDER — FENTANYL CITRATE (PF) 100 MCG/2ML IJ SOLN
INTRAMUSCULAR | Status: AC
  Filled 2015-02-06: qty 2

## 2015-02-06 MED ORDER — PREDNISONE 20 MG PO TABS: ORAL_TABLET | ORAL | Status: DC

## 2015-02-06 MED ORDER — SODIUM CHLORIDE 0.9 % IV SOLN
INTRAVENOUS | Status: DC
  Administered 2015-02-06: 08:00:00 via INTRAVENOUS

## 2015-02-06 MED ORDER — MIDAZOLAM HCL 2 MG/2ML IJ SOLN
INTRAMUSCULAR | Status: AC
  Filled 2015-02-06: qty 4

## 2015-02-06 MED ORDER — ALLOPURINOL 300 MG PO TABS: 300.0000 mg | ORAL_TABLET | Freq: Every day | ORAL | Status: DC

## 2015-02-06 NOTE — Discharge Instructions (Signed)
Quiet rest remainder of day. No change in diet or medications. Leave dressing on for 24 hours then may remove and bathe as usual. Call [facility] at [phone] for any profuse bleeding or pain not relieved by Tylenol   Moderate Conscious Sedation, Adult Sedation is the use of medicines to promote relaxation and relieve discomfort and anxiety. Moderate conscious sedation is a type of sedation. Under moderate conscious sedation you are less alert than normal but are still able to respond to instructions or stimulation. Moderate conscious sedation is used during short medical and dental procedures. It is milder than deep sedation or general anesthesia and allows you to return to your regular activities sooner. LET [facility] CARE PROVIDER KNOW ABOUT:   Any allergies you have.  All medicines you are taking, including vitamins, herbs, eye drops, creams, and over-the-counter medicines.  Use of steroids (by mouth or creams).  Previous problems you or members of your family have had with the use of anesthetics.  Any blood disorders you have.  Previous surgeries you have had.  Medical conditions you have.  Possibility of pregnancy, if this applies.  Use of cigarettes, alcohol, or illegal drugs. RISKS AND COMPLICATIONS Generally, this is a safe procedure. However, as with any procedure, problems can occur. Possible problems include:  Oversedation.  Trouble breathing on your own. You may need to have a breathing tube until you are awake and breathing on your own.  Allergic reaction to any of the medicines used for the procedure. BEFORE THE PROCEDURE  You may have blood tests done. These tests can help show how well your kidneys and liver are working. They can also show how well your blood clots.  A physical exam will be done.  Only take medicines as directed by your health care provider. You may need to stop taking medicines (such as blood thinners, aspirin, or nonsteroidal  anti-inflammatory drugs) before the procedure.   Do not eat or drink at least 6 hours before the procedure or as directed by your health care provider.  Arrange for a responsible adult, family member, or friend to take you home after the procedure. He or she should stay with you for at least 24 hours after the procedure, until the medicine has worn off. PROCEDURE   An intravenous (IV) catheter will be inserted into one of your veins. Medicine will be able to flow directly into your body through this catheter. You may be given medicine through this tube to help prevent pain and help you relax.  The medical or dental procedure will be done. AFTER THE PROCEDURE  You will stay in a recovery area until the medicine has worn off. Your blood pressure and pulse will be checked.   Depending on the procedure you had, you may be allowed to go home when you can tolerate liquids and your pain is under control.   This information is not intended to replace advice given to you by your health care provider. Make sure you discuss any questions you have with your health care provider.   Document Released: 10/15/2000 Document Revised: 02/10/2014 Document Reviewed: 09/27/2012 Elsevier Interactive Patient Education 2016 [place]. Bone Marrow Aspiration and Bone Marrow Biopsy, Care After Refer to this sheet in the next few weeks. These instructions provide you with information about caring for yourself after your procedure. Your health care provider may also give you more specific instructions. Your treatment has been planned according to current medical practices, but problems sometimes occur. Call your  health care provider if you have any problems or questions after your procedure. WHAT TO EXPECT AFTER THE PROCEDURE After your procedure, it is common to have:  Soreness or tenderness around the puncture site.  Bruising. HOME CARE INSTRUCTIONS  Take medicines only as directed by your health care  provider.  Follow your health care provider's instructions about:  Puncture site care.  Bandage (dressing) changes and removal.  Bathe and shower as directed by your health care provider.  Check your puncture site every day for signs of infection. Watch for:  Redness, swelling, or pain.  Fluid, blood, or pus.  Return to your normal activities as directed by your health care provider.  Keep all follow-up visits as directed by your health care provider. This is important. SEEK MEDICAL CARE IF:  You have a fever.  You have uncontrollable bleeding.  You have redness, swelling, or pain at the site of your puncture.  You have fluid, blood, or pus coming from your puncture site.   This information is not intended to replace advice given to you by your health care provider. Make sure you discuss any questions you have with your health care provider.   Document Released: 08/09/2004 Document Revised: 06/06/2014 Document Reviewed: 01/11/2014 Elsevier Interactive Patient Education 2016 [place]. Bone Marrow Aspiration and Bone Marrow Biopsy Bone marrow aspiration and bone marrow biopsy are procedures that are done to diagnose blood disorders. You may also have one of these procedures to help diagnose infections or some types of cancer. Bone marrow is the soft tissue that is inside your bones. Blood cells are produced in bone marrow. For bone marrow aspiration, a sample of tissue in liquid form is removed from inside your bone. For a bone marrow biopsy, a small core of bone marrow tissue is removed. Then these samples are examined under a microscope or tested in a lab. You may need these procedures if you have an abnormal complete blood count (CBC). The aspiration or biopsy sample is usually taken from the top of your hip bone. Sometimes, an aspiration sample is taken from your chest bone (sternum). LET [facility] CARE PROVIDER KNOW ABOUT:  Any allergies you have.  All medicines you  are taking, including vitamins, herbs, eye drops, creams, and over-the-counter medicines.  Previous problems you or members of your family have had with the use of anesthetics.  Any blood disorders you have.  Previous surgeries you have had.  Any medical conditions you may have.  Whether you are pregnant or you think that you may be pregnant. RISKS AND COMPLICATIONS Generally, this is a safe procedure. However, problems may occur, including:  Infection.  Bleeding. BEFORE THE PROCEDURE  Ask your health care provider about:  Changing or stopping your regular medicines. This is especially important if you are taking diabetes medicines or blood thinners.  Taking medicines such as aspirin and ibuprofen. These medicines can thin your blood. Do not take these medicines before your procedure if your health care provider instructs you not to.  Plan to have someone take you home after the procedure.  If you go home right after the procedure, plan to have someone with you for 24 hours. PROCEDURE   An IV tube may be inserted into one of your veins.  The injection site will be cleaned with a germ-killing solution (antiseptic).  You will be given one or more of the following:  A medicine that helps you relax (sedative).  A medicine that numbs the area (local anesthetic).  The bone marrow sample will be removed as follows:  For an aspiration, a hollow needle will be inserted through your skin and into your bone. Bone marrow fluid will be drawn up into a syringe.  For a biopsy, your health care provider will use a hollow needle to remove a core of tissue from your bone marrow.  The needle will be removed.  A bandage (dressing) will be placed over the insertion site and taped in place. The procedure may vary among health care providers and hospitals. AFTER THE PROCEDURE  Your blood pressure, heart rate, breathing rate, and blood oxygen level will be monitored often until the  medicines you were given have worn off.  Return to your normal activities as directed by your health care provider.   This information is not intended to replace advice given to you by your health care provider. Make sure you discuss any questions you have with your health care provider.   Document Released: 01/24/2004 Document Revised: 06/06/2014 Document Reviewed: 01/11/2014 Elsevier Interactive Patient Education [employer].

## 2015-02-06 NOTE — Progress Notes (Signed)
Patient's wife informed nurse (over the phone) that patient had a port. This was after nurse started IV in arm. Patient stated he did not think to tell us. Informed patient to tell everyone he has a port. Or wear arm band.

## 2015-02-06 NOTE — Progress Notes (Signed)
Patient ID: [name], male   DOB: [birth year], 65 y.o.   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 He has history of mantle cell lymphoma and presents again today for CT guided bone marrow biopsy for further evaluation. He currently denies fever, HA,CP,dyspnea, abd/back pain,N/V or bleeding.   Allergies: Review of patient's allergies indicates no known allergies.  Medications: Prior to Admission medications   Medication Sig Start Date End Date Taking? Authorizing Provider  lisinopril-hydrochlorothiazide (PRINZIDE,ZESTORETIC) 20-25 MG tablet Take 1 tablet by mouth daily.   Yes Historical Provider, MD  lovastatin (MEVACOR) 20 MG tablet Take 20 mg by mouth at bedtime.   Yes Historical Provider, MD  sucralfate (CARAFATE) 1 G tablet Take 1 tablet (1 g total) by mouth 4 (four) times daily -  with meals and at bedtime. 01/27/15  Yes Cherene Altes, MD  allopurinol (ZYLOPRIM) 300 MG tablet Take 1 tablet (300 mg total) by mouth daily. 02/01/15   Chauncey Cruel, MD  LORazepam (ATIVAN) 0.5 MG tablet Take 1 tablet (0.5 mg total) by mouth every 6 (six) hours as needed (Nausea or vomiting). 02/01/15   Chauncey Cruel, MD  omeprazole (PRILOSEC) 40 MG capsule Take 1 capsule (40 mg total) by mouth 2 (two) times daily. 01/22/15   Bonnielee Haff, MD  ondansetron (ZOFRAN) 8 MG tablet Take 1 tablet (8 mg total) by mouth 2 (two) times daily. Start the day after chemo for 3 days. Then as needed for nausea or vomiting. 02/01/15   Chauncey Cruel, MD  predniSONE (DELTASONE) 20 MG tablet Take 3 tablets a day ( in am ) with food x 5 days starting on day 1 of treatment plan 02/01/15   Chauncey Cruel, MD  prochlorperazine (COMPAZINE) 10 MG tablet Take 1 tablet (10 mg total) by  mouth every 6 (six) hours as needed (Nausea or vomiting). 02/01/15   Chauncey Cruel, MD     Vital Signs: BP 105/76 mmHg  Pulse 90  Temp(Src) 98.2 F (36.8 C) (Oral)  Resp 16  SpO2 99%  Physical Exam  Constitutional: He is oriented to person, place, and time. He appears well-developed and well-nourished.  Pulmonary/Chest: Effort normal and breath sounds normal.  Clean, intact rt chest wall PAC  Abdominal: Soft. Bowel sounds are normal. There is no tenderness.  Musculoskeletal: Normal range of motion. He exhibits no edema.  Neurological: He is alert and oriented to person, place, and time.    Imaging: No results found.  Labs:  CBC:  Recent Labs  01/26/15 0415 01/26/15 1834 01/27/15 0544 02/06/15 0720  WBC 8.9 10.3 8.4 7.8  HGB 6.6* 9.2* 9.0* 9.4*  HCT 20.0* 27.3* 26.5* 29.2*  PLT 228 274 264 556*    COAGS:  Recent Labs  01/17/15 2119 01/18/15 0941 01/23/15 0751 01/25/15 0355 02/06/15 0720  INR  --  1.14 1.53* 1.24 1.03  APTT 31  --   --   --  31    BMP:  Recent Labs  01/24/15 0430 01/25/15 0355 01/26/15 0415 01/27/15 0544  NA 141 141 139 139  K 4.3 4.1 3.9 3.7  CL 114* 113* 109 106  CO2 21* '23 24 24  '$ GLUCOSE 142* 112* 103* 84  BUN 21* '19 10 9  '$ CALCIUM 6.7*  7.4* 7.7* 8.1*  CREATININE 1.65* 1.21 0.99 1.09  GFRNONAA 43* >60 >60 >60  GFRAA 50* >60 >60 >60    LIVER FUNCTION TESTS:  Recent Labs  01/21/15 0422 01/23/15 0751 01/24/15 0430 01/27/15 0544  BILITOT 0.5 0.2* 0.5 0.6  AST 23 44* 39 24  ALT 50 37 37 23  ALKPHOS 64 67 61 55  PROT 5.3* 4.5* 4.1* 5.4*  ALBUMIN 2.5* 2.3* 2.0* 2.5*    Assessment and Plan: Pt with recently diagnosed mantle cell lymphoma. Plan is for CT guided bone marrow biopsy today.Risks and Benefits discussed with the patient including, but not limited to bleeding, infection, damage to adjacent structures or low yield requiring additional tests.All of the patient's questions were answered, patient is agreeable  to proceed.Consent signed and in chart.       Signed: D. Rowe Robert 02/06/2015, 8:28 AM   I spent a total of 15 mins atpatient's bedside AND on the patient's hospital floor or unit, greater than 50% of which was counseling/coordinating care for CT guided bone marrow biopsy

## 2015-02-06 NOTE — Procedures (Addendum)
Technically successful CT guided bone marrow aspiration and biopsy of right iliac crest. No immediate complications.    SignedSandi Mariscal [phone] 02/06/2015, 9:26 AM

## 2015-02-07 ENCOUNTER — Encounter: Payer: Self-pay | Admitting: *Deleted

## 2015-02-07 ENCOUNTER — Inpatient Hospital Stay (HOSPITAL_COMMUNITY)
Admission: EM | Admit: 2015-02-07 | Discharge: 2015-02-08 | DRG: 378 | Disposition: A | Discharge status: Home / Self Care | Payer: BLUE CROSS/BLUE SHIELD | Attending: Internal Medicine | Admitting: Internal Medicine

## 2015-02-07 DIAGNOSIS — E785 Hyperlipidemia, unspecified: Secondary | ICD-10-CM | POA: Diagnosis present

## 2015-02-07 DIAGNOSIS — D649 Anemia, unspecified: Secondary | ICD-10-CM | POA: Diagnosis present

## 2015-02-07 DIAGNOSIS — Z8042 Family history of malignant neoplasm of prostate: Secondary | ICD-10-CM | POA: Diagnosis not present

## 2015-02-07 DIAGNOSIS — Z8249 Family history of ischemic heart disease and other diseases of the circulatory system: Secondary | ICD-10-CM | POA: Diagnosis not present

## 2015-02-07 DIAGNOSIS — K219 Gastro-esophageal reflux disease without esophagitis: Secondary | ICD-10-CM | POA: Diagnosis present

## 2015-02-07 DIAGNOSIS — K264 Chronic or unspecified duodenal ulcer with hemorrhage: Secondary | ICD-10-CM | POA: Diagnosis present

## 2015-02-07 DIAGNOSIS — K922 Gastrointestinal hemorrhage, unspecified: Secondary | ICD-10-CM | POA: Diagnosis not present

## 2015-02-07 DIAGNOSIS — R55 Syncope and collapse: Secondary | ICD-10-CM | POA: Diagnosis present

## 2015-02-07 DIAGNOSIS — I10 Essential (primary) hypertension: Secondary | ICD-10-CM | POA: Diagnosis not present

## 2015-02-07 DIAGNOSIS — Z841 Family history of disorders of kidney and ureter: Secondary | ICD-10-CM

## 2015-02-07 DIAGNOSIS — R591 Generalized enlarged lymph nodes: Secondary | ICD-10-CM | POA: Diagnosis present

## 2015-02-07 DIAGNOSIS — C8319 Mantle cell lymphoma, extranodal and solid organ sites: Secondary | ICD-10-CM | POA: Diagnosis not present

## 2015-02-07 DIAGNOSIS — D500 Iron deficiency anemia secondary to blood loss (chronic): Secondary | ICD-10-CM | POA: Diagnosis present

## 2015-02-07 DIAGNOSIS — Z833 Family history of diabetes mellitus: Secondary | ICD-10-CM | POA: Diagnosis not present

## 2015-02-07 LAB — CBC WITH DIFFERENTIAL/PLATELET
BASOS ABS: 0 10*3/uL (ref 0.0–0.1)
Basophils Relative: 0 %
Eosinophils Absolute: 0.1 10*3/uL (ref 0.0–0.7)
Eosinophils Relative: 2 %
HEMATOCRIT: 22.6 % — AB (ref 39.0–52.0)
HEMOGLOBIN: 7.5 g/dL — AB (ref 13.0–17.0)
LYMPHS ABS: 1.1 10*3/uL (ref 0.7–4.0)
LYMPHS PCT: 13 %
MCH: 28 pg (ref 26.0–34.0)
MCHC: 33.2 g/dL (ref 30.0–36.0)
MCV: 84.3 fL (ref 78.0–100.0)
Monocytes Absolute: 0.6 10*3/uL (ref 0.1–1.0)
Monocytes Relative: 6 %
NEUTROS ABS: 7 10*3/uL (ref 1.7–7.7)
NEUTROS PCT: 79 %
Platelets: 538 10*3/uL — ABNORMAL HIGH (ref 150–400)
RBC: 2.68 MIL/uL — AB (ref 4.22–5.81)
RDW: 14.6 % (ref 11.5–15.5)
WBC: 8.8 10*3/uL (ref 4.0–10.5)

## 2015-02-07 LAB — BASIC METABOLIC PANEL
ANION GAP: 10 (ref 5–15)
BUN: 28 mg/dL — ABNORMAL HIGH (ref 6–20)
CO2: 27 mmol/L (ref 22–32)
Calcium: 8.9 mg/dL (ref 8.9–10.3)
Chloride: 100 mmol/L — ABNORMAL LOW (ref 101–111)
Creatinine, Ser: 1.24 mg/dL (ref 0.61–1.24)
GFR calc non Af Amer: >60 mL/min (ref >60)
GLUCOSE: 108 mg/dL — AB (ref 65–99)
POTASSIUM: 4.4 mmol/L (ref 3.5–5.1)
Sodium: 137 mmol/L (ref 135–145)

## 2015-02-07 LAB — POC OCCULT BLOOD, ED: Fecal Occult Bld: POSITIVE — AB

## 2015-02-07 LAB — TROPONIN I: Troponin I: <0.03 ng/mL (ref <0.031)

## 2015-02-07 LAB — PREPARE RBC (CROSSMATCH)

## 2015-02-07 LAB — ABO/RH: ABO/RH(D): AB POS

## 2015-02-07 MED ORDER — PANTOPRAZOLE SODIUM 40 MG IV SOLR
40.0000 mg | Freq: Two times a day (BID) | INTRAVENOUS | Status: DC
  Administered 2015-02-08: 40 mg via INTRAVENOUS
  Filled 2015-02-07 (×2): qty 40

## 2015-02-07 MED ORDER — SODIUM CHLORIDE 0.9 % IV SOLN
Freq: Once | INTRAVENOUS | Status: AC
  Administered 2015-02-07: 21:00:00 via INTRAVENOUS

## 2015-02-07 MED ORDER — SODIUM CHLORIDE 0.9 % IV SOLN
INTRAVENOUS | Status: DC
  Administered 2015-02-07: 18:00:00 via INTRAVENOUS

## 2015-02-07 MED ORDER — SODIUM CHLORIDE 0.9 % IV BOLUS (SEPSIS)
1000.0000 mL | Freq: Once | INTRAVENOUS | Status: AC
  Administered 2015-02-07: 1000 mL via INTRAVENOUS

## 2015-02-07 NOTE — ED Provider Notes (Signed)
CSN: [id number]     Arrival date & time 02/07/15  1724 History   First MD Initiated Contact with Patient 02/07/15 1731     Chief Complaint  Patient presents with  . Ca pt, Near Syncope      (Consider location/radiation/quality/duration/timing/severity/associated sxs/prior Treatment) HPI Comments: Patient here after having a near syncopal event prior to arrival. Has been in his baseline state of health today when he was walking became dizzy. He denied any associated chest pain or shortness of breath. Denies any abdominal pain. No black or bloody stools. Patient is to start chemotherapy next week. Denies any medications currently. When he laid flat he felt much better. Does have a recent admission for low hemoglobin several days ago. No treatment used prior to arrival  The history is provided by the patient.    Past Medical History  Diagnosis Date  . Essential hypertension   . HLD (hyperlipidemia)   . GERD (gastroesophageal reflux disease)   . Anemia   . Cancer ([place])     Lymphoma  . GI bleed   . Weakness    Past Surgical History  Procedure Laterality Date  . Skin surgery      Small benign cysts over left scalp removed  . Esophagogastroduodenoscopy Left 01/18/2015    Procedure: ESOPHAGOGASTRODUODENOSCOPY (EGD);  Surgeon: Wilford Corner, MD;  Location: [facility] ENDOSCOPY;  Service: Endoscopy;  Laterality: Left;  . Inguinal hernia repair Right 01/22/2015    Procedure: RIGHT INGUINAL LYMPH NODE BX;  Surgeon: Donnie Mesa, MD;  Location: [facility];  Service: General;  Laterality: Right;  . Portacath placement  01/26/2015     power port with tip SVC/RA [place]   Family History  Problem Relation Age of Onset  . Hypertension Mother   . Hypertension Father   . Hypertension Sister   . Diabetes Sister   . Prostate cancer Brother   . Lupus Sister   . Kidney failure Father    Social History  Substance Use Topics  . Smoking status: Never Smoker   . Smokeless tobacco: None  . Alcohol Use:  No    Review of Systems  All other systems reviewed and are negative.     Allergies  Review of patient's allergies indicates no known allergies.  Home Medications   Prior to Admission medications   Medication Sig Start Date End Date Taking? Authorizing Provider  allopurinol (ZYLOPRIM) 300 MG tablet Take 1 tablet (300 mg total) by mouth daily. 02/06/15   Chauncey Cruel, MD  lisinopril-hydrochlorothiazide (PRINZIDE,ZESTORETIC) 20-25 MG tablet Take 1 tablet by mouth daily.    Historical Provider, MD  LORazepam (ATIVAN) 0.5 MG tablet Take 1 tablet (0.5 mg total) by mouth every 6 (six) hours as needed (Nausea or vomiting). 02/01/15   Chauncey Cruel, MD  lovastatin (MEVACOR) 20 MG tablet Take 20 mg by mouth at bedtime.    Historical Provider, MD  omeprazole (PRILOSEC) 40 MG capsule Take 1 capsule (40 mg total) by mouth 2 (two) times daily. 01/22/15   Bonnielee Haff, MD  ondansetron (ZOFRAN) 8 MG tablet Take 1 tablet (8 mg total) by mouth 2 (two) times daily. Start the day after chemo for 3 days. Then as needed for nausea or vomiting. 02/06/15   Chauncey Cruel, MD  predniSONE (DELTASONE) 20 MG tablet Take 3 tablets a day ( in am ) with food x 5 days starting on day 1 of treatment plan 02/06/15   Chauncey Cruel, MD  prochlorperazine (COMPAZINE) 10 MG tablet  Take 1 tablet (10 mg total) by mouth every 6 (six) hours as needed (Nausea or vomiting). 02/06/15   Chauncey Cruel, MD  sucralfate (CARAFATE) 1 G tablet Take 1 tablet (1 g total) by mouth 4 (four) times daily -  with meals and at bedtime. 01/27/15   Cherene Altes, MD   BP 113/82 mmHg  Pulse 92  Temp(Src) 98.2 F (36.8 C) (Oral)  Resp 16  SpO2 100% Physical Exam  Constitutional: He is oriented to person, place, and time. He appears well-developed and well-nourished.  Non-toxic appearance. No distress.  HENT:  Head: Normocephalic and atraumatic.  Eyes: Conjunctivae, EOM and lids are normal. Pupils are equal, round, and  reactive to light.  Neck: Normal range of motion. Neck supple. No tracheal deviation present. No thyroid mass present.  Cardiovascular: Normal rate, regular rhythm and normal heart sounds.  Exam reveals no gallop.   No murmur heard. Pulmonary/Chest: Effort normal and breath sounds normal. No stridor. No respiratory distress. He has no decreased breath sounds. He has no wheezes. He has no rhonchi. He has no rales.  Abdominal: Soft. Normal appearance and bowel sounds are normal. He exhibits no distension. There is no tenderness. There is no rebound and no CVA tenderness.  Musculoskeletal: Normal range of motion. He exhibits no edema or tenderness.  Neurological: He is alert and oriented to person, place, and time. He has normal strength. No cranial nerve deficit or sensory deficit. GCS eye subscore is 4. GCS verbal subscore is 5. GCS motor subscore is 6.  Skin: Skin is warm and dry. No abrasion and no rash noted.  Psychiatric: He has a normal mood and affect. His speech is normal and behavior is normal.  Nursing note and vitals reviewed.   ED Course  Procedures (including critical care time) Labs Review Labs Reviewed  CBC WITH DIFFERENTIAL/PLATELET  TROPONIN I  BASIC METABOLIC PANEL    Imaging Review Ct Biopsy  02/06/2015  INDICATION: Lymphoma. Please perform CT-guided bone marrow biopsy for tissue diagnostic purposes. EXAM: CT-GUIDED BONE MARROW BIOPSY AND ASPIRATION MEDICATIONS: Fentanyl 75 mcg IV; Versed 1.5 mg IV ANESTHESIA/SEDATION: Sedation Time 5 minutes CONTRAST:  None COMPLICATIONS: None immediate. PROCEDURE: Informed consent was obtained from the patient following an explanation of the procedure, risks, benefits and alternatives. The patient understands, agrees and consents for the procedure. All questions were addressed. A time out was performed prior to the initiation of the procedure. The patient was positioned prone and non-contrast localization CT was performed of the pelvis to  demonstrate the iliac marrow spaces. The operative site was prepped and draped in the usual sterile fashion. Under sterile conditions and local anesthesia, a 22 gauge spinal needle was utilized for procedural planning. Next, an 11 gauge coaxial bone biopsy needle was advanced into the right iliac marrow space. Needle position was confirmed with CT imaging. Initially, bone marrow aspiration was performed. Next, a bone marrow biopsy was obtained with the 11 gauge outer bone marrow device. Samples were prepared with the cytotechnologist and deemed adequate. The needle was removed intact. Hemostasis was obtained with compression and a dressing was placed. The patient tolerated the procedure well without immediate post procedural complication. IMPRESSION: Successful CT guided right iliac bone marrow aspiration and core biopsy. Electronically Signed   By: Sandi Mariscal M.D.   On: 02/06/2015 10:18   I have personally reviewed and evaluated these images and lab results as part of my medical decision-making.   EKG Interpretation   Date/Time:  Wednesday  February 07 2015 17:40:46 EST Ventricular Rate:  86 PR Interval:  172 QRS Duration: 79 QT Interval:  379 QTC Calculation: 453 R Axis:   48 Text Interpretation:  Sinus rhythm Confirmed by Zenia Resides  MD, Vittorio Mohs ([id number])  on 02/07/2015 7:39:19 PM      MDM   Final diagnoses:  None    Patient's hemoglobin noted and no records show that he has a known duodenal ulcer. Denies any black or bloody stools. Denies vomiting any blood. Will order blood transfusion and admitted to medicine    Lacretia Leigh, MD 02/07/15 2008

## 2015-02-07 NOTE — ED Notes (Signed)
Blood consent form signed by patient and witnessed by this RN. Signed consent at bedside.

## 2015-02-07 NOTE — ED Notes (Addendum)
[name], NT at bedside while fecal occult sample obtained by this RN

## 2015-02-07 NOTE — ED Notes (Addendum)
Pt reports currently has lymphoma, has tumor in chest/ abd. 2 weeks ago pt tumor was bleeding and pt was admitted. Today pt was walking at grocery store and started feeling funny and "burping". Pt went home, at home everything started spinning. Pt sat on the floor and felt better. Denies blood in urine or stool. Denies vomiting. Denies pain. Reports cough since "2 weeks ago when they stuck a tube down his throat".

## 2015-02-07 NOTE — Telephone Encounter (Signed)
No additional note

## 2015-02-07 NOTE — ED Notes (Signed)
Ati, RN at bedside to verify patient's blood.

## 2015-02-07 NOTE — ED Notes (Signed)
Protonix cannot be given at this time due to blood running

## 2015-02-07 NOTE — ED Notes (Signed)
This RN at patient's bedside during the first 15 minutes of blood administration. Patient informed about symptoms of a blood reaction. Patient verbalized understanding and the importance of letting hospital staff know if he begins to feel any of the symptoms.

## 2015-02-07 NOTE — ED Notes (Signed)
UNABLE TO COLLECT LABS PATIENTS HAS BLOOD PRODUCTS RUNNING

## 2015-02-07 NOTE — ED Notes (Signed)
Pt. Receiving blood, called main lab and cannot draw labs until 2 hours after receiving blood.

## 2015-02-07 NOTE — H&P (Signed)
[facility] Admission History and Physical       [name] [name]:[id number] DOB: 1950-11-01 DOA: 02/07/2015  Referring physician: EDP PCP: [name], PA-C  Specialists:   Chief Complaint: Feeling Faint  HPI: [name] is a 65 y.o. male with a history of Mantle Cell Lymphoma who presented to the ED with complaints of weakness and feeeling faint and felt as if he would pass out today.   He denies any fevers or chills or nausea vomiting or diarrhea.   He denies any hematachezia or melena.    He was evalauted and found to have a hemoglobin of  7.5 in the ED, and yestrerday when he underwent his bone marrow biopsy his hemoglobin had been 9.4.    Of Note, he was hospitalized on 01/23/2015 for GI Bleeding and was transfused.     Review of Systems:  Constitutional: No Weight Loss, No Weight Gain, Night Sweats, Fevers, Chills, Dizziness, Light Headedness, Fatigue, +Generalized Weakness HEENT: No Headaches, Difficulty Swallowing,Tooth/Dental Problems,Sore Throat,  No Sneezing, Rhinitis, Ear Ache, Nasal Congestion, or Post Nasal Drip,  Cardio-vascular:  No Chest pain, Orthopnea, PND, Edema in Lower Extremities, Anasarca, Dizziness, Palpitations  Resp: No Dyspnea, No DOE, No Productive Cough, No Non-Productive Cough, No Hemoptysis, No Wheezing.    GI: No Heartburn, Indigestion, Abdominal Pain, Nausea, Vomiting, Diarrhea, Constipation, Hematemesis, Hematochezia, Melena, Change in Bowel Habits,  Loss of Appetite  GU: No Dysuria, No Change in Color of Urine, No Urgency or Urinary Frequency, No Flank pain.  Musculoskeletal: No Joint Pain or Swelling, No Decreased Range of Motion, No Back Pain.  Neurologic: + Near Syncope, No Seizures, Muscle Weakness, Paresthesia, Vision Disturbance or Loss, No Diplopia, No Vertigo, No Difficulty Walking,  Skin: No Rash or Lesions. Psych: No Change in Mood or Affect, No Depression or Anxiety, No Memory loss, No Confusion, or  Hallucinations   Past Medical History  Diagnosis Date  . Essential hypertension   . HLD (hyperlipidemia)   . GERD (gastroesophageal reflux disease)   . Anemia   . Cancer ([place])     Lymphoma  . GI bleed   . Weakness      Past Surgical History  Procedure Laterality Date  . Skin surgery      Small benign cysts over left scalp removed  . Esophagogastroduodenoscopy Left 01/18/2015    Procedure: ESOPHAGOGASTRODUODENOSCOPY (EGD);  Surgeon: Wilford Corner, MD;  Location: [facility] ENDOSCOPY;  Service: Endoscopy;  Laterality: Left;  . Inguinal hernia repair Right 01/22/2015    Procedure: RIGHT INGUINAL LYMPH NODE BX;  Surgeon: Donnie Mesa, MD;  Location: [place];  Service: General;  Laterality: Right;  . Portacath placement  01/26/2015     power port with tip SVC/RA Junction      Prior to Admission medications   Medication Sig Start Date End Date Taking? Authorizing Provider  lisinopril-hydrochlorothiazide (PRINZIDE,ZESTORETIC) 20-25 MG tablet Take 1 tablet by mouth daily.   Yes Historical Provider, MD  lovastatin (MEVACOR) 20 MG tablet Take 20 mg by mouth at bedtime.   Yes Historical Provider, MD  sucralfate (CARAFATE) 1 G tablet Take 1 tablet (1 g total) by mouth 4 (four) times daily -  with meals and at bedtime. 01/27/15  Yes Cherene Altes, MD  allopurinol (ZYLOPRIM) 300 MG tablet Take 1 tablet (300 mg total) by mouth daily. 02/06/15   Chauncey Cruel, MD  LORazepam (ATIVAN) 0.5 MG tablet Take 1 tablet (0.5 mg total) by mouth every 6 (six) hours as needed (  Nausea or vomiting). 02/01/15   Chauncey Cruel, MD  omeprazole (PRILOSEC) 40 MG capsule Take 1 capsule (40 mg total) by mouth 2 (two) times daily. 01/22/15   Bonnielee Haff, MD  ondansetron (ZOFRAN) 8 MG tablet Take 1 tablet (8 mg total) by mouth 2 (two) times daily. Start the day after chemo for 3 days. Then as needed for nausea or vomiting. 02/06/15   Chauncey Cruel, MD  predniSONE (DELTASONE) 20 MG tablet Take 3 tablets a day (  in am ) with food x 5 days starting on day 1 of treatment plan 02/06/15   Chauncey Cruel, MD  prochlorperazine (COMPAZINE) 10 MG tablet Take 1 tablet (10 mg total) by mouth every 6 (six) hours as needed (Nausea or vomiting). 02/06/15   Chauncey Cruel, MD     No Known Allergies    Social History:  reports that he has never smoked. He has never used smokeless tobacco. He reports that he drinks alcohol. He reports that he does not use illicit drugs.    Family History  Problem Relation Age of Onset  . Hypertension Mother   . Hypertension Father   . Hypertension Sister   . Diabetes Sister   . Prostate cancer Brother   . Lupus Sister   . Kidney failure Father        Physical Exam:  GEN:  Pleasant Well Nourished and Well Developed 65 y.o. African American male examined and in no acute distress; cooperative with exam Filed Vitals:   02/07/15 1731 02/07/15 1936 02/07/15 2100  BP: 113/82 114/73 115/79  Pulse: 92 77 76  Temp: 98.2 F (36.8 C)    TempSrc: Oral    Resp: _0 SpO2: 100% 99% 100%   Blood pressure 115/79, pulse 76, temperature 98.2 F (36.8 C), temperature source Oral, resp. rate 16, SpO2 100 %. PSYCH: He is alert and oriented x4; does not appear anxious does not appear depressed; affect is normal HEENT: Normocephalic and Atraumatic, Mucous membranes pink; PERRLA; EOM intact; Fundi:  Benign;  No scleral icterus, Nares: Patent, Oropharynx: Clear, Fair Dentition,    Neck:  FROM, No Cervical Lymphadenopathy nor Thyromegaly or Carotid Bruit; No JVD; Breasts:: Not examined CHEST WALL: No tenderness CHEST: Normal respiration, clear to auscultation bilaterally HEART: Regular rate and rhythm; no murmurs rubs or gallops BACK: No kyphosis or scoliosis; No CVA tenderness ABDOMEN: Positive Bowel Sounds, Soft Non-Tender, No Rebound or Guarding; No Masses, No Organomegaly. Rectal Exam: Not done EXTREMITIES: No Cyanosis, Clubbing, or Edema; No Ulcerations. Genitalia: not  examined PULSES: 2+ and symmetric SKIN: Normal hydration no rash or ulceration CNS:  Alert and Oriented x 4, No Focal Deficits bilaterally  Vascular: pulses palpable throughout    Labs on Admission:  Basic Metabolic Panel:  Recent Labs Lab 02/07/15 1809  NA 137  K 4.4  CL 100*  CO2 27  GLUCOSE 108*  BUN 28*  CREATININE 1.24  CALCIUM 8.9   Liver Function Tests: No results for input(s): AST, ALT, ALKPHOS, BILITOT, PROT, ALBUMIN in the last 168 hours. No results for input(s): LIPASE, AMYLASE in the last 168 hours. No results for input(s): AMMONIA in the last 168 hours. CBC:  Recent Labs Lab 02/06/15 0720 02/07/15 1809  WBC 7.8 8.8  NEUTROABS  --  7.0  HGB 9.4* 7.5*  HCT 29.2* 22.6*  MCV 86.6 84.3  PLT 556* 538*   Cardiac Enzymes:  Recent Labs Lab 02/07/15 1809  TROPONINI <0.03  BNP (last 3 results) No results for input(s): BNP in the last 8760 hours.  ProBNP (last 3 results) No results for input(s): PROBNP in the last 8760 hours.  CBG: No results for input(s): GLUCAP in the last 168 hours.  Radiological Exams on Admission: Ct Biopsy  02/06/2015  INDICATION: Lymphoma. Please perform CT-guided bone marrow biopsy for tissue diagnostic purposes. EXAM: CT-GUIDED BONE MARROW BIOPSY AND ASPIRATION MEDICATIONS: Fentanyl 75 mcg IV; Versed 1.5 mg IV ANESTHESIA/SEDATION: Sedation Time 5 minutes CONTRAST:  None COMPLICATIONS: None immediate. PROCEDURE: Informed consent was obtained from the patient following an explanation of the procedure, risks, benefits and alternatives. The patient understands, agrees and consents for the procedure. All questions were addressed. A time out was performed prior to the initiation of the procedure. The patient was positioned prone and non-contrast localization CT was performed of the pelvis to demonstrate the iliac marrow spaces. The operative site was prepped and draped in the usual sterile fashion. Under sterile conditions and local  anesthesia, a 22 gauge spinal needle was utilized for procedural planning. Next, an 11 gauge coaxial bone biopsy needle was advanced into the right iliac marrow space. Needle position was confirmed with CT imaging. Initially, bone marrow aspiration was performed. Next, a bone marrow biopsy was obtained with the 11 gauge outer bone marrow device. Samples were prepared with the cytotechnologist and deemed adequate. The needle was removed intact. Hemostasis was obtained with compression and a dressing was placed. The patient tolerated the procedure well without immediate post procedural complication. IMPRESSION: Successful CT guided right iliac bone marrow aspiration and core biopsy. Electronically Signed   By: Sandi Mariscal M.D.   On: 02/06/2015 10:18     EKG: Independently reviewed.    Assessment/Plan:       65 y.o. male with  Active Problems:   1.      GI bleed   Transfuse 1 unit , and Transfuse PRN   Monitor Hemoglobin Levels   IV Protonix   Consider GI consult in AM if needed     2.      Symptomatic anemia- due to #1       3.      Pre-Syncope- due to # 2   Cardiac monitoring     4.       Thrombocytosis   Monitor Trend    5.     Essential hypertension   Monitor   PRN IV Hydralazine for SBP > 160    6.     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     DVT Prophylaxis   SCDs    Code Status:     FULL CODE        Family Communication:   No Family Present    Disposition Plan:    Inpatient  Status        Time spent:   [place] Pager [phone]   If [place] Please Contact the Day Rounding Team MD for [facility]  If 7PM-7AM, Please Contact Night-Floor Coverage  [email or web address] Password [email or web address] 02/07/2015, 9:55 PM     ADDENDUM:   Patient was seen and examined on 02/07/2015

## 2015-02-08 ENCOUNTER — Other Ambulatory Visit: Payer: Self-pay | Admitting: *Deleted

## 2015-02-08 DIAGNOSIS — I10 Essential (primary) hypertension: Secondary | ICD-10-CM

## 2015-02-08 DIAGNOSIS — D649 Anemia, unspecified: Secondary | ICD-10-CM

## 2015-02-08 DIAGNOSIS — C8319 Mantle cell lymphoma, extranodal and solid organ sites: Secondary | ICD-10-CM

## 2015-02-08 LAB — BASIC METABOLIC PANEL
ANION GAP: 9 (ref 5–15)
BUN: 22 mg/dL — ABNORMAL HIGH (ref 6–20)
CALCIUM: 8.9 mg/dL (ref 8.9–10.3)
CO2: 27 mmol/L (ref 22–32)
Chloride: 102 mmol/L (ref 101–111)
Creatinine, Ser: 1.11 mg/dL (ref 0.61–1.24)
GLUCOSE: 115 mg/dL — AB (ref 65–99)
Potassium: 4 mmol/L (ref 3.5–5.1)
SODIUM: 138 mmol/L (ref 135–145)

## 2015-02-08 LAB — CBC
HCT: 30 % — ABNORMAL LOW (ref 39.0–52.0)
HEMOGLOBIN: 9.9 g/dL — AB (ref 13.0–17.0)
MCH: 28.5 pg (ref 26.0–34.0)
MCHC: 33 g/dL (ref 30.0–36.0)
MCV: 86.5 fL (ref 78.0–100.0)
Platelets: 505 10*3/uL — ABNORMAL HIGH (ref 150–400)
RBC: 3.47 MIL/uL — AB (ref 4.22–5.81)
RDW: 14.8 % (ref 11.5–15.5)
WBC: 7.1 10*3/uL (ref 4.0–10.5)

## 2015-02-08 LAB — HEMOGLOBIN AND HEMATOCRIT, BLOOD
HEMATOCRIT: 30.3 % — AB (ref 39.0–52.0)
HEMOGLOBIN: 10 g/dL — AB (ref 13.0–17.0)

## 2015-02-08 MED ORDER — PRAVASTATIN SODIUM 20 MG PO TABS
20.0000 mg | ORAL_TABLET | Freq: Every day | ORAL | Status: DC
  Filled 2015-02-08: qty 1

## 2015-02-08 MED ORDER — SUCRALFATE 1 G PO TABS: 1.0000 g | ORAL_TABLET | Freq: Three times a day (TID) | ORAL | Status: DC

## 2015-02-08 MED ORDER — OMEPRAZOLE 40 MG PO CPDR: 40.0000 mg | DELAYED_RELEASE_CAPSULE | Freq: Two times a day (BID) | ORAL | Status: DC

## 2015-02-08 MED ORDER — ONDANSETRON HCL 4 MG PO TABS: 4.0000 mg | ORAL_TABLET | Freq: Four times a day (QID) | ORAL | Status: DC | PRN

## 2015-02-08 MED ORDER — ACETAMINOPHEN 325 MG PO TABS: 650.0000 mg | ORAL_TABLET | Freq: Four times a day (QID) | ORAL | Status: DC | PRN

## 2015-02-08 MED ORDER — ALUM & MAG HYDROXIDE-SIMETH 200-200-20 MG/5ML PO SUSP: 30.0000 mL | Freq: Four times a day (QID) | ORAL | Status: DC | PRN

## 2015-02-08 MED ORDER — SODIUM CHLORIDE 0.9 % IJ SOLN
10.0000 mL | INTRAMUSCULAR | Status: DC | PRN
  Administered 2015-02-08: 10 mL

## 2015-02-08 MED ORDER — SODIUM CHLORIDE 0.9 % IV SOLN
INTRAVENOUS | Status: DC
  Administered 2015-02-08: 04:00:00 via INTRAVENOUS

## 2015-02-08 MED ORDER — HEPARIN SOD (PORK) LOCK FLUSH 100 UNIT/ML IV SOLN
500.0000 [IU] | INTRAVENOUS | Status: AC | PRN
  Administered 2015-02-08: 500 [IU]

## 2015-02-08 MED ORDER — ACETAMINOPHEN 650 MG RE SUPP: 650.0000 mg | Freq: Four times a day (QID) | RECTAL | Status: DC | PRN

## 2015-02-08 MED ORDER — ONDANSETRON HCL 4 MG/2ML IJ SOLN: 4.0000 mg | Freq: Four times a day (QID) | INTRAMUSCULAR | Status: DC | PRN

## 2015-02-08 MED ORDER — HYDROMORPHONE HCL 1 MG/ML IJ SOLN: 0.5000 mg | INTRAMUSCULAR | Status: DC | PRN

## 2015-02-08 MED ORDER — LORAZEPAM 0.5 MG PO TABS: 0.5000 mg | ORAL_TABLET | Freq: Four times a day (QID) | ORAL | Status: DC | PRN

## 2015-02-08 MED ORDER — OXYCODONE HCL 5 MG PO TABS: 5.0000 mg | ORAL_TABLET | ORAL | Status: DC | PRN

## 2015-02-08 MED ORDER — ALLOPURINOL 300 MG PO TABS
300.0000 mg | ORAL_TABLET | Freq: Every day | ORAL | Status: DC
  Administered 2015-02-08: 300 mg via ORAL
  Filled 2015-02-08: qty 1

## 2015-02-08 NOTE — ED Notes (Addendum)
This RN stayed with patient for the first 15 minutes of blood administration.

## 2015-02-08 NOTE — Consult Note (Signed)
[facility] Consult Note  Referring Provider: No ref. provider found Primary Care Physician:  [name], PA-C Primary Gastroenterologist:  Dr.  Laurel Dimmer Complaint: Weakness HPI: [name] is an 65 y.o. black male  he was recently admitted with GI bleeding found to have a fairly large duodenal ulcer 3 weeks ago by endoscopy and treated with PPI. He is scheduled for further chemotherapy for his lymphoma tomorrow and noted weakness yesterday and came to the emergency room. He claims to have had normal brown stools but they were guaiac positive. His hemoglobin was down 7.5, down 2 g from his most recent one. From talking to him he sounds like he has been taking Carafate 4 times a day but was really not familiar with a PPI by name and said that he just got a prescription that he was to take 1 time a day, which was "supposed to do the same thing "as Carafate. He is listed as being on omeprazole twice a day.  Past Medical History  Diagnosis Date  . Essential hypertension   . HLD (hyperlipidemia)   . GERD (gastroesophageal reflux disease)   . Anemia   . Cancer ([place])     Lymphoma  . GI bleed   . Weakness     Past Surgical History  Procedure Laterality Date  . Skin surgery      Small benign cysts over left scalp removed  . Esophagogastroduodenoscopy Left 01/18/2015    Procedure: ESOPHAGOGASTRODUODENOSCOPY (EGD);  Surgeon: Wilford Corner, MD;  Location: [facility] ENDOSCOPY;  Service: Endoscopy;  Laterality: Left;  . Inguinal hernia repair Right 01/22/2015    Procedure: RIGHT INGUINAL LYMPH NODE BX;  Surgeon: Donnie Mesa, MD;  Location: [place];  Service: General;  Laterality: Right;  . Portacath placement  01/26/2015     power port with tip SVC/RA Junction    Medications Prior to Admission  Medication Sig Dispense Refill  . lisinopril-hydrochlorothiazide (PRINZIDE,ZESTORETIC) 20-25 MG tablet Take 1 tablet by mouth daily.    Marland Kitchen lovastatin (MEVACOR) 20 MG tablet Take 20  mg by mouth at bedtime.    . sucralfate (CARAFATE) 1 G tablet Take 1 tablet (1 g total) by mouth 4 (four) times daily -  with meals and at bedtime. 90 tablet 0  . allopurinol (ZYLOPRIM) 300 MG tablet Take 1 tablet (300 mg total) by mouth daily. 30 tablet 3  . LORazepam (ATIVAN) 0.5 MG tablet Take 1 tablet (0.5 mg total) by mouth every 6 (six) hours as needed (Nausea or vomiting). 30 tablet 0  . omeprazole (PRILOSEC) 40 MG capsule Take 1 capsule (40 mg total) by mouth 2 (two) times daily. 60 capsule 3  . ondansetron (ZOFRAN) 8 MG tablet Take 1 tablet (8 mg total) by mouth 2 (two) times daily. Start the day after chemo for 3 days. Then as needed for nausea or vomiting. 30 tablet 1  . predniSONE (DELTASONE) 20 MG tablet Take 3 tablets a day ( in am ) with food x 5 days starting on day 1 of treatment plan 15 tablet 6  . prochlorperazine (COMPAZINE) 10 MG tablet Take 1 tablet (10 mg total) by mouth every 6 (six) hours as needed (Nausea or vomiting). 30 tablet 6    Allergies: No Known Allergies  Family History  Problem Relation Age of Onset  . Hypertension Mother   . Hypertension Father   . Hypertension Sister   . Diabetes Sister   . Prostate cancer Brother   . Lupus Sister   .  Kidney failure Father     Social History:  reports that he has never smoked. He has never used smokeless tobacco. He reports that he drinks alcohol. He reports that he does not use illicit drugs.  Review of Systems: negative except as above   Blood pressure 130/81, pulse 65, temperature 97.9 F (36.6 C), temperature source Oral, resp. rate 16, height _0  (1.778 m), weight 85.6 kg (188 lb 11.4 oz), SpO2 99 %. Head: Normocephalic, without obvious abnormality, atraumatic Neck: no adenopathy, no carotid bruit, no JVD, supple, symmetrical, trachea midline and thyroid not enlarged, symmetric, no tenderness/mass/nodules Resp: clear to auscultation bilaterally Cardio: regular rate and rhythm, S1, S2 normal, no murmur,  click, rub or gallop GI: Abdomen soft nondistended with normoactive bowel sounds. No hepatosplenomegaly mass or guarding. Extremities: extremities normal, atraumatic, no cyanosis or edema  Results for orders placed or performed during the hospital encounter of 02/07/15 (from the past 48 hour(s))  CBC with Differential/Platelet     Status: Abnormal   Collection Time: 02/07/15  6:09 PM  Result Value Ref Range   WBC 8.8 4.0 - 10.5 K/uL   RBC 2.68 (L) 4.22 - 5.81 MIL/uL   Hemoglobin 7.5 (L) 13.0 - 17.0 g/dL    Comment: REPEATED TO VERIFY DELTA CHECK NOTED    HCT 22.6 (L) 39.0 - 52.0 %   MCV 84.3 78.0 - 100.0 fL   MCH 28.0 26.0 - 34.0 pg   MCHC 33.2 30.0 - 36.0 g/dL   RDW 14.6 11.5 - 15.5 %   Platelets 538 (H) 150 - 400 K/uL   Neutrophils Relative % 79 %   Neutro Abs 7.0 1.7 - 7.7 K/uL   Lymphocytes Relative 13 %   Lymphs Abs 1.1 0.7 - 4.0 K/uL   Monocytes Relative 6 %   Monocytes Absolute 0.6 0.1 - 1.0 K/uL   Eosinophils Relative 2 %   Eosinophils Absolute 0.1 0.0 - 0.7 K/uL   Basophils Relative 0 %   Basophils Absolute 0.0 0.0 - 0.1 K/uL  Troponin I     Status: None   Collection Time: 02/07/15  6:09 PM  Result Value Ref Range   Troponin I <0.03 <0.031 ng/mL    Comment:        NO INDICATION OF MYOCARDIAL INJURY.   Basic metabolic panel     Status: Abnormal   Collection Time: 02/07/15  6:09 PM  Result Value Ref Range   Sodium 137 135 - 145 mmol/L   Potassium 4.4 3.5 - 5.1 mmol/L   Chloride 100 (L) 101 - 111 mmol/L   CO2 27 22 - 32 mmol/L   Glucose, Bld 108 (H) 65 - 99 mg/dL   BUN 28 (H) 6 - 20 mg/dL   Creatinine, Ser 1.24 0.61 - 1.24 mg/dL   Calcium 8.9 8.9 - 10.3 mg/dL   GFR calc non Af Amer >60 >60 mL/min   GFR calc Af Amer >60 >60 mL/min    Comment: (NOTE) The eGFR has been calculated using the CKD EPI equation. This calculation has not been validated in all clinical situations. eGFR's persistently <60 mL/min signify possible Chronic Kidney Disease.    Anion gap  10 5 - 15  ABO/Rh     Status: None   Collection Time: 02/07/15  8:26 PM  Result Value Ref Range   ABO/RH(D) AB POS   Type and screen     Status: None (Preliminary result)   Collection Time: 02/07/15  8:27 PM  Result Value Ref  Range   ABO/RH(D) AB POS    Antibody Screen NEG    Sample Expiration 02/10/2015    Unit Number [id number]    Blood Component Type RED CELLS,LR    Unit division 00    Status of Unit ISSUED    Transfusion Status OK TO TRANSFUSE    Crossmatch Result Compatible    Unit Number [id number]    Blood Component Type RED CELLS,LR    Unit division 00    Status of Unit ISSUED,FINAL    Transfusion Status OK TO TRANSFUSE    Crossmatch Result Compatible   Prepare RBC     Status: None   Collection Time: 02/07/15  8:32 PM  Result Value Ref Range   Order Confirmation ORDER PROCESSED BY BLOOD BANK   POC occult blood, ED RN will collect     Status: Abnormal   Collection Time: 02/07/15  8:36 PM  Result Value Ref Range   Fecal Occult Bld POSITIVE (A) NEGATIVE  Basic metabolic panel     Status: Abnormal   Collection Time: 02/08/15  7:05 AM  Result Value Ref Range   Sodium 138 135 - 145 mmol/L   Potassium 4.0 3.5 - 5.1 mmol/L   Chloride 102 101 - 111 mmol/L   CO2 27 22 - 32 mmol/L   Glucose, Bld 115 (H) 65 - 99 mg/dL   BUN 22 (H) 6 - 20 mg/dL   Creatinine, Ser 1.11 0.61 - 1.24 mg/dL   Calcium 8.9 8.9 - 10.3 mg/dL   GFR calc non Af Amer >60 >60 mL/min   GFR calc Af Amer >60 >60 mL/min    Comment: (NOTE) The eGFR has been calculated using the CKD EPI equation. This calculation has not been validated in all clinical situations. eGFR's persistently <60 mL/min signify possible Chronic Kidney Disease.    Anion gap 9 5 - 15  CBC     Status: Abnormal   Collection Time: 02/08/15  7:05 AM  Result Value Ref Range   WBC 7.1 4.0 - 10.5 K/uL   RBC 3.47 (L) 4.22 - 5.81 MIL/uL   Hemoglobin 9.9 (L) 13.0 - 17.0 g/dL    Comment: REPEATED TO VERIFY DELTA CHECK NOTED POST  TRANSFUSION SPECIMEN    HCT 30.0 (L) 39.0 - 52.0 %   MCV 86.5 78.0 - 100.0 fL   MCH 28.5 26.0 - 34.0 pg   MCHC 33.0 30.0 - 36.0 g/dL   RDW 14.8 11.5 - 15.5 %   Platelets 505 (H) 150 - 400 K/uL   No results found.  Assessment: Recurrent low-grade GI bleeding with known large duodenal ulcer. Compliance with PPI therapy somewhat in question. Plan:  As long as stools are not melenic and with symptom improvement after transfusion, think he can be discharged to be put on a PPI twice a day for at least 6 months as well as Carafate for one month. Do not think he needs endoscopy at this point. We will sign off. He expects to go home tomorrow to start his chemotherapy. Please call if further GI input needed. Philamena Kramar C 02/08/2015, 10:53 AM  Pager [phone] If no answer or after 5 PM call [phone]

## 2015-02-08 NOTE — Care Management Note (Signed)
Case Management Note  Patient Details  Name: [name] MRN: [id number] Date of Birth: [birth year]  Subjective/Objective: 65 y/o m admitted w/GIB. From home.                   Action/Plan:d/c plan home.   Expected Discharge Date:   (unknown)               Expected Discharge Plan:  Home/Self Care  In-House Referral:     Discharge planning Services  CM Consult  Post Acute Care Choice:    Choice offered to:     DME Arranged:    DME Agency:     HH Arranged:    HH Agency:     Status of Service:  In process, will continue to follow  Medicare Important Message Given:    Date Medicare IM Given:    Medicare IM give by:    Date Additional Medicare IM Given:    Additional Medicare Important Message give by:     If discussed at [place] of Stay Meetings, dates discussed:    Additional Comments:  Dessa Phi, RN 02/08/2015, 4:03 PM

## 2015-02-08 NOTE — Discharge Summary (Signed)
Physician Discharge Summary  [name] XBL:[id number] DOB: 17-Apr-1950 DOA: 02/07/2015  PCP: [name], PA-C  Admit date: 02/07/2015 Discharge date: 02/08/2015  Time spent: 20 minutes  Recommendations for Outpatient Follow-up:  1. 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 Briefly, 65 y.o. male with a history of Mantle Cell Lymphoma who presented to the ED with complaints of weakness and near syncope. He denies any fevers or chills or nausea vomiting or diarrhea. He denies any hematachezia or melena. He was evalauted and found to have a hemoglobin of 7.5 in the ED, and yestrerday when he underwent his bone marrow biopsy his hemoglobin had been 9.4.  Hospital Course:   1. Acute blood loss anemia secondary to presumed UGI bleed Patient was transfused 1 unit of blood with post transfusion hgb of 10 Patient was continued on IV Protonix while admitted Consulted GI with recs for BID PPI for 6 mos and carafate x 1 month at least    2. Symptomatic anemia- due to #1   Improved    3. Pre-Syncope- due to # 2 Cardiac monitoring while admitted   Remained stable    Likely secondary to acute blood loss anemia     4. Thrombocytosis Remained stable   5. Essential hypertension BP remained stable and controlled   6. Mantle cell lymphoma of extranodal and solid organ  sites [facility]) Pt to start chemo on 1/6   7. DVT Prophylaxis SCDs while admitted  Consultations:  Gastroenterology  Discharge Exam: Filed Vitals:   02/08/15 0410 02/08/15 0511 02/08/15 0513 02/08/15 1443  BP: 110/70 130/81  122/81  Pulse: 76 65  83  Temp: 98.2 F (36.8 C) 97.9 F (36.6 C)  98 F (36.7 C)  TempSrc: Oral Oral  Oral  Resp: _0 Height:  _1  (1.778 m) _2  (1.778 m)   Weight:  85.6 kg (188 lb 11.4 oz) 85.6 kg (188 lb 11.4 oz)   SpO2: 100% 99%  99%    General: awake, in nad Cardiovascular: regular, s1, s2 Respiratory: normal resp effort, no wheezing  Discharge Instructions     Medication List    TAKE these medications        allopurinol 300 MG tablet  Commonly known as:  ZYLOPRIM  Take 1 tablet (300 mg total) by mouth daily.     lisinopril-hydrochlorothiazide 20-25 MG tablet  Commonly known as:  PRINZIDE,ZESTORETIC  Take 1 tablet by mouth daily.     LORazepam 0.5 MG tablet  Commonly known as:  ATIVAN  Take 1 tablet (0.5 mg total) by mouth every 6 (six) hours as needed (Nausea or vomiting).     lovastatin 20 MG tablet  Commonly known as:  MEVACOR  Take 20 mg by mouth at bedtime.     omeprazole 40 MG capsule  Commonly known as:  PRILOSEC  Take 1 capsule (40 mg total) by mouth 2 (two) times daily.     ondansetron 8 MG tablet  Commonly known as:  ZOFRAN  Take  1 tablet (8 mg total) by mouth 2 (two) times daily. Start the day after chemo for 3 days. Then as needed for nausea or vomiting.     predniSONE 20 MG tablet  Commonly known as:  DELTASONE  Take 3 tablets a day ( in am ) with food x 5 days starting on day 1 of treatment plan     prochlorperazine 10 MG tablet  Commonly known as:  COMPAZINE  Take 1 tablet (10 mg total) by mouth every 6 (six) hours as needed (Nausea or vomiting).     sucralfate 1 g tablet  Commonly known as:  CARAFATE  Take 1 tablet (1 g total) by mouth 4  (four) times daily -  with meals and at bedtime.       No Known Allergies Follow-up Information    Follow up with [name], PA-C. Schedule an appointment as soon as possible for a visit in 2 weeks.   Specialty:  Physician Assistant   Why:  Hospital follow up   Contact information:   [address] [place] [address] [phone]        The results of significant diagnostics from this hospitalization (including imaging, microbiology, ancillary and laboratory) are listed below for reference.    Significant Diagnostic Studies: Ct Head Wo Contrast  01/23/2015  CLINICAL DATA:  65 year old male with fall. EXAM: CT HEAD WITHOUT CONTRAST CT CERVICAL SPINE WITHOUT CONTRAST TECHNIQUE: Multidetector CT imaging of the head and cervical spine was performed following the standard protocol without intravenous contrast. Multiplanar CT image reconstructions of the cervical spine were also generated. COMPARISON:  None. FINDINGS: CT HEAD FINDINGS The ventricles and sulci are appropriate in size for patient's age. Mild periventricular and deep white matter hypodensities represent chronic microvascular ischemic changes. There is no intracranial hemorrhage. No mass effect or midline shift identified. An endotracheal and enteric tube are partially visualized. There is diffuse mucoperiosteal thickening of the paranasal sinuses with partial opacification of the ethmoid air cells. The mastoid air cells are well aerated. Right occipital scalp hematoma. The calvarium is intact. CT CERVICAL SPINE FINDINGS There is no acute fracture or subluxation of the cervical spine.Mild multilevel degenerative changes.The odontoid and spinous processes are intact.There is normal anatomic alignment of the C1-C2 lateral masses. The visualized soft tissues appear unremarkable. An enteric tube and an endotracheal tube are partially visualized. Partially visualized bilateral pleural effusions, right greater than left. There is  bilateral supraclavicular and right cervical adenopathy. Upper mediastinal lymphadenopathy noted. Chest CT is recommended for further evaluation. Right supraclavicular soft tissue hemorrhage noted. IMPRESSION: No acute intracranial hemorrhage. 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 No acute/traumatic cervical spine pathology. Bilateral supraclavicular and right cervical as well as mediastinal adenopathy. Further evaluation recommended. Electronically Signed   By: Anner Crete M.D.   On: 01/23/2015 18:27   Ct Chest W Contrast  01/21/2015  CLINICAL DATA:  Mediastinal mass. EXAM: CT CHEST WITH CONTRAST TECHNIQUE: Multidetector CT imaging of the chest was performed during intravenous contrast administration. CONTRAST:  92m OMNIPAQUE IOHEXOL 300 MG/ML  SOLN COMPARISON:  CT abdomen and pelvis dated 01/20/2015. FINDINGS: On yesterday's CT of the abdomen and pelvis, a large ulcerated mass involving the distal stomach and proximal duodenum was identified, with extension to the porta hepatis and gastrohepatic region, suggesting lymphoma. Associated lymphadenopathy was identified in the lower anterior mediastinum, retroperitoneum and pelvis, further suggesting lymphoma. Additional lymphadenopathy was suspected in the sub-carinal region of the mediastinum, incompletely imaged. On today's study, conglomerate  subcarinal lymphadenopathy is confirmed, measuring 7.5 x 2.8 cm on image 33 of series 201. This conglomerate lymphadenopathy extends upwards within the mediastinum encasing the mid and lower portions of the trachea, without effacement of the trachea. At the level of the lower trachea, the conglomerate lymphadenopathy measures approximately 5.9 x 3.9 cm. Additional lymphadenopathy within the anterior mediastinum measures 4.4 x 2.3 cm (image 17, series 201). Additional smaller lymph nodes scattered throughout the mediastinum and bilateral perihilar regions. Numerous small and moderately  enlarged lymph nodes are seen within each axillary region. Clustered small and enlarged lymph nodes are seen within the supraclavicular regions bilaterally. There is a right pleural effusion, small to moderate in size, with adjacent compressive atelectasis. There is also a small left pleural effusion with adjacent atelectasis. Very mild degenerative change noted within the cervical and thoracic spine. No acute osseous abnormality identified. Thoracic aorta is normal in caliber and configuration. Heart size is normal. No pericardial effusion. No central obstructing pulmonary embolism seen. Upper abdominal findings described on yesterday's CT. IMPRESSION: 1. Extensive mediastinal lymphadenopathy, bilateral axillary lymphadenopathy and supraclavicular lymphadenopathy. 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 2. Right pleural effusion, small to moderate in size, with adjacent atelectasis. Additional smaller left pleural effusion. Electronically Signed   By: Franki Cabot M.D.   On: 01/21/2015 16:47   Ct Cervical Spine Wo Contrast  01/23/2015  CLINICAL DATA:  64 year old male with fall. EXAM: CT HEAD WITHOUT CONTRAST CT CERVICAL SPINE WITHOUT CONTRAST TECHNIQUE: Multidetector CT imaging of the head and cervical spine was performed following the standard protocol without intravenous contrast. Multiplanar CT image reconstructions of the cervical spine were also generated. COMPARISON:  None. FINDINGS: CT HEAD FINDINGS The ventricles and sulci are appropriate in size for patient's age. Mild periventricular and deep white matter hypodensities represent chronic microvascular ischemic changes. There is no intracranial hemorrhage. No mass effect or midline shift identified. An endotracheal and enteric tube are partially visualized. There is diffuse mucoperiosteal thickening of the paranasal sinuses with partial opacification of the  ethmoid air cells. The mastoid air cells are well aerated. Right occipital scalp hematoma. The calvarium is intact. CT CERVICAL SPINE FINDINGS There is no acute fracture or subluxation of the cervical spine.Mild multilevel degenerative changes.The odontoid and spinous processes are intact.There is normal anatomic alignment of the C1-C2 lateral masses. The visualized soft tissues appear unremarkable. An enteric tube and an endotracheal tube are partially visualized. Partially visualized bilateral pleural effusions, right greater than left. There is bilateral supraclavicular and right cervical adenopathy. Upper mediastinal lymphadenopathy noted. Chest CT is recommended for further evaluation. Right supraclavicular soft tissue hemorrhage noted. IMPRESSION: No acute intracranial hemorrhage. 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 No acute/traumatic cervical spine pathology. Bilateral supraclavicular and right cervical as well as mediastinal adenopathy. Further evaluation recommended. Electronically Signed   By: Anner Crete M.D.   On: 01/23/2015 18:27   Ct Abdomen Pelvis W Contrast  01/20/2015  CLINICAL DATA:  GI bleed with syncopal episodes. Massive duodenal bulb ulcer by EGD. Abnormal abdominal ultrasound demonstrating heterogeneous soft tissue prominence in the region of the porta hepatis pancreatic head. EXAM: CT ABDOMEN AND PELVIS WITH CONTRAST TECHNIQUE: Multidetector CT imaging of the abdomen and pelvis was performed using the standard protocol following bolus administration of intravenous contrast. CONTRAST:  156m OMNIPAQUE IOHEXOL 300 MG/ML  SOLN COMPARISON:  Right upper quadrant abdominal ultrasound on 01/19/2015. FINDINGS: In the lower chest, soft tissue  mass in the anterior lower mediastinum abuts the pericardium and measures roughly 4.0 x 7.7 cm in axial dimensions. This is most likely consistent with a lymph node mass. The superior images also show potentially the  bottom margin of subcarinal lymphadenopathy. There are small bilateral pleural effusions, right greater than left. In abdomen, a large confluent mass is identified involving the distal stomach and proximal duodenum with visible ulceration. At the level of the proximal duodenum, thickening of the duodenal wall approaches 3.5 cm. Soft tissue mass extends into the porta hepatis and gastrohepatic region and likely tracks along portal triads into the liver. Soft tissue mass also abuts and obscures definition of the pancreatic head and proximal pancreatic body. Mass also surrounds the portal vein, portal confluence and abuts the superior mesenteric vein. There is associated retroperitoneal lymphadenopathy in the lower abdomen and pelvis. Extensive bilateral iliac chain lymphadenopathy present. Elongated external iliac lymph node masses bilaterally measure roughly 1.8 cm in short axis. Multiple mildly enlarged bilateral inguinal lymph nodes also present. 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 No evidence of bowel obstruction or free intraperitoneal air. No focal abscess is identified. The gallbladder, distal pancreas, spleen, adrenal glands and kidneys appear unremarkable. No bony lesions are seen. The bladder is decompressed. No hernias are identified. IMPRESSION: Large ulcerated mass involving the distal stomach and proximal duodenum and extending into the porta hepatis and gastrohepatic region. Associated lymphadenopathy in the lower anterior mediastinum, retroperitoneum and pelvis. Also suspected subcarinal lymphadenopathy is partially visualized. 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 Electronically Signed   By: Aletta Edouard M.D.   On: 01/20/2015 13:50   Ir Angiogram Visceral Selective  01/23/2015  CLINICAL DATA:  65 year old male presents with acute life-threatening upper GI bleed secondary to an  ulcerated mass encompassing the distal stomach and duodenum. 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 Endoscopic treatment options are limited and surgery is extremely high risk. Patient presents for emergent visceral arteriography and embolization. Additionally, central venous access is required. A triple-lumen central venous catheter will be placed during the course of the procedure. EXAM: SELECTIVE VISCERAL ARTERIOGRAPHY; IR EMBO ART VEN HEMORR LYMPH EXTRAV INC GUIDE ROADMAPPING; IR LEFT FLOURO GUIDE CV LINE; ADDITIONAL ARTERIOGRAPHY; IR ULTRASOUND GUIDANCE VASC ACCESS LEFT; ARTERIOGRAPHY Date: 01/23/2015 PROCEDURE: 1. Ultrasound-guided puncture left common femoral artery 2. Catheterization of the superior mesenteric artery with arteriogram 3. Catheterization of the celiac artery with arteriogram 4. Catheterization of the gastroduodenal artery with arteriogram 5. Catheterization of the right gastroepiploic artery with arteriogram 6. Coil embolization of the gastroduodenal artery 7. Post embolization arteriogram 8. Limited left common femoral arteriogram 9. Ultrasound-guided puncture of the left common femoral vein 10. Placement of a triple-lumen central venous catheter via the left femoral vein with fluoroscopic guidance Interventional Radiologist:  Criselda Peaches, MD ANESTHESIA/SEDATION: Moderate (conscious) sedation was used. 1 mg Versed, 50 mcg Fentanyl were administered intravenously. The patient's vital signs were monitored continuously by radiology nursing throughout the procedure. Sedation Time: 60 minutes MEDICATIONS: None additional FLUOROSCOPY TIME:  16 minutes for a total of 622 mGy CONTRAST:  158m OMNIPAQUE IOHEXOL 300 MG/ML  SOLN TECHNIQUE: Informed consent was obtained from the patient following explanation of the procedure, risks, benefits and alternatives. The patient understands, agrees and consents for  the procedure. All questions were addressed. A time out was performed. Maximal barrier sterile technique utilized including caps, mask, sterile gowns, sterile  gloves, large sterile drape, hand hygiene, and Betadine skin prep. The left groin was interrogated with ultrasound. The common femoral artery is found to be widely patent. An image was obtained and stored for the medical record. Local anesthesia was attained by infiltration with 1% lidocaine. Under real-time sonographic guidance, the vessel was punctured with a 21 gauge micropuncture needle. Using standard technique, the initial micro wire was exchanged through a transitional 5 [place] micro sheath for a working 0.035 inch Bentson wire. The micro sheath was then exchanged for a working 5 [place] vascular sheath. A C2 cobra catheter was advanced into the abdominal aorta over the wire. The catheter was initially advanced into the superior mesenteric artery and superior mesenteric arteriogram was performed. There is conventional anatomy. No replaced or accessory right hepatic artery. No evidence of active extravasation or significant collateralization through the pancreaticoduodenal arcade. The C2 cobra catheter was next advanced into the celiac artery. An arteriogram was performed. The gastroduodenal artery is elongated and slightly irregular in the mid segment likely secondary to external mass effect. There is no evidence of active extravasation. A renegade ST micro catheter was advanced coaxially over a fat and 16 wire. The micro catheter was advanced into the gastroduodenal artery. Arteriography was again performed. There are multiple branches arising from the gastroduodenal artery providing supply to a hypervascular mass in the region the descending duodenum. The micro catheter was carefully advanced more distally into the proximal right gastroepiploic artery. Contrast injection was performed confirming the location of the catheter tip as well as confirming no  additional supplied to the region of tumor. Coil embolization was then performed of the gastroduodenal artery using a combination of vortex, standard helical and soft interlock detachable micro coils ranging in size from 2-6 mm. Post embolization arteriography confirms successful embolization of the vessel with no further visible hypervascular blush of the duodenum tumor. The micro catheter was removed. Repeat celiac artery injection was performed confirming no additional collateral supply. The 5 French catheter was readvanced of the superior mesenteric artery and an additional angiogram was performed again confirming no collateral flow to the embolized territory. The catheters were removed. The vascular sheath was left in place for hemodynamic monitoring. Ultrasound was again used to interrogate the left groin. The left common femoral vein was identified. Local anesthesia was attained by infiltration with 1% lidocaine. The left common femoral vein was punctured with an 18 gauge needle. A wire was advanced into the inferior vena cava. The skin tract was dilated and an arrow triple-lumen central venous catheter advanced over the wire and position with the tip at the confluence of the IVC and left common iliac vein. The catheter was flushed, capped and secured with 0 Prolene suture. Sterile bandages were applied. COMPLICATIONS: None IMPRESSION: 1. No evidence of active extravasation. 2. 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 3. Coil embolization of the gastroduodenal artery. 4. Placement of a left femoral triple-lumen central venous catheter. The catheter tip is at the confluence of the IVC and iliac vein and ready for immediate use. Signed, Criselda Peaches, MD [facility] Electronically Signed   By: Jacqulynn Cadet M.D.   On: 01/23/2015 15:50   Ir Angiogram  Visceral Selective  01/23/2015  CLINICAL DATA:  65 year old male presents with acute life-threatening upper GI bleed secondary to an ulcerated mass encompassing the distal stomach and duodenum. The patient's underlying etiology is favored to represent  lymphoma which should is currently undergoing confirmatory diagnosis. Endoscopic treatment options are limited and surgery is extremely high risk. Patient presents for emergent visceral arteriography and embolization. Additionally, central venous access is required. A triple-lumen central venous catheter will be placed during the course of the procedure. EXAM: SELECTIVE VISCERAL ARTERIOGRAPHY; IR EMBO ART VEN HEMORR LYMPH EXTRAV INC GUIDE ROADMAPPING; IR LEFT FLOURO GUIDE CV LINE; ADDITIONAL ARTERIOGRAPHY; IR ULTRASOUND GUIDANCE VASC ACCESS LEFT; ARTERIOGRAPHY Date: 01/23/2015 PROCEDURE: 1. Ultrasound-guided puncture left common femoral artery 2. Catheterization of the superior mesenteric artery with arteriogram 3. Catheterization of the celiac artery with arteriogram 4. Catheterization of the gastroduodenal artery with arteriogram 5. Catheterization of the right gastroepiploic artery with arteriogram 6. Coil embolization of the gastroduodenal artery 7. Post embolization arteriogram 8. Limited left common femoral arteriogram 9. Ultrasound-guided puncture of the left common femoral vein 10. Placement of a triple-lumen central venous catheter via the left femoral vein with fluoroscopic guidance Interventional Radiologist:  Criselda Peaches, MD ANESTHESIA/SEDATION: Moderate (conscious) sedation was used. 1 mg Versed, 50 mcg Fentanyl were administered intravenously. The patient's vital signs were monitored continuously by radiology nursing throughout the procedure. Sedation Time: 60 minutes MEDICATIONS: None additional FLUOROSCOPY TIME:  16 minutes for a total of 622 mGy CONTRAST:  177m OMNIPAQUE IOHEXOL 300 MG/ML  SOLN TECHNIQUE: Informed consent was obtained from  the patient following explanation of the procedure, risks, benefits and alternatives. The patient understands, agrees and consents for the procedure. All questions were addressed. A time out was performed. Maximal barrier sterile technique utilized including caps, mask, sterile gowns, sterile gloves, large sterile drape, hand hygiene, and Betadine skin prep. The left groin was interrogated with ultrasound. The common femoral artery is found to be widely patent. An image was obtained and stored for the medical record. Local anesthesia was attained by infiltration with 1% lidocaine. Under real-time sonographic guidance, the vessel was punctured with a 21 gauge micropuncture needle. Using standard technique, the initial micro wire was exchanged through a transitional 5 [place] sheath for a working 0.035 inch Bentson wire. The micro sheath was then exchanged for a working 5 [place] sheath. A C2 cobra catheter was advanced into the abdominal aorta over the wire. The catheter was initially advanced into the superior mesenteric artery and superior mesenteric arteriogram was performed. There is conventional anatomy. No replaced or accessory right hepatic artery. No evidence of active extravasation or significant collateralization through the pancreaticoduodenal arcade. The C2 cobra catheter was next advanced into the celiac artery. An arteriogram was performed. The gastroduodenal artery is elongated and slightly irregular in the mid segment likely secondary to external mass effect. There is no evidence of active extravasation. A renegade ST micro catheter was advanced coaxially over a fat and 16 wire. The micro catheter was advanced into the gastroduodenal artery. Arteriography was again performed. There are multiple branches arising from the gastroduodenal artery providing supply to a hypervascular mass in the region the descending duodenum. The micro catheter was carefully advanced more distally into the  proximal right gastroepiploic artery. Contrast injection was performed confirming the location of the catheter tip as well as confirming no additional supplied to the region of tumor. Coil embolization was then performed of the gastroduodenal artery using a combination of vortex, standard helical and soft interlock detachable micro coils ranging in size from 2-6 mm. Post embolization arteriography confirms successful embolization of the vessel with no further visible hypervascular blush of the duodenum tumor. The micro catheter was removed. Repeat celiac artery  injection was performed confirming no additional collateral supply. The 5 French catheter was readvanced of the superior mesenteric artery and an additional angiogram was performed again confirming no collateral flow to the embolized territory. The catheters were removed. The vascular sheath was left in place for hemodynamic monitoring. Ultrasound was again used to interrogate the left groin. The left common femoral vein was identified. Local anesthesia was attained by infiltration with 1% lidocaine. The left common femoral vein was punctured with an 18 gauge needle. A wire was advanced into the inferior vena cava. The skin tract was dilated and an arrow triple-lumen central venous catheter advanced over the wire and position with the tip at the confluence of the IVC and left common iliac vein. The catheter was flushed, capped and secured with 0 Prolene suture. Sterile bandages were applied. COMPLICATIONS: None IMPRESSION: 1. No evidence of active extravasation. 2. 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 3. Coil embolization of the gastroduodenal artery. 4. Placement of a left femoral triple-lumen central venous catheter. The catheter tip is at the confluence of the IVC and iliac vein and ready for immediate use. Signed, Criselda Peaches, MD [facility] Radiology Electronically Signed   By: Jacqulynn Cadet M.D.   On: 01/23/2015 15:50   Ir Angiogram Selective Each Additional Vessel  01/23/2015  CLINICAL DATA:  64 year old male presents with acute life-threatening upper GI bleed secondary to an ulcerated mass encompassing the distal stomach and duodenum. 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 Endoscopic treatment options are limited and surgery is extremely high risk. Patient presents for emergent visceral arteriography and embolization. Additionally, central venous access is required. A triple-lumen central venous catheter will be placed during the course of the procedure. EXAM: SELECTIVE VISCERAL ARTERIOGRAPHY; IR EMBO ART VEN HEMORR LYMPH EXTRAV INC GUIDE ROADMAPPING; IR LEFT FLOURO GUIDE CV LINE; ADDITIONAL ARTERIOGRAPHY; IR ULTRASOUND GUIDANCE VASC ACCESS LEFT; ARTERIOGRAPHY Date: 01/23/2015 PROCEDURE: 1. Ultrasound-guided puncture left common femoral artery 2. Catheterization of the superior mesenteric artery with arteriogram 3. Catheterization of the celiac artery with arteriogram 4. Catheterization of the gastroduodenal artery with arteriogram 5. Catheterization of the right gastroepiploic artery with arteriogram 6. Coil embolization of the gastroduodenal artery 7. Post embolization arteriogram 8. Limited left common femoral arteriogram 9. Ultrasound-guided puncture of the left common femoral vein 10. Placement of a triple-lumen central venous catheter via the left femoral vein with fluoroscopic guidance Interventional Radiologist:  Criselda Peaches, MD ANESTHESIA/SEDATION: Moderate (conscious) sedation was used. 1 mg Versed, 50 mcg Fentanyl were administered intravenously. The patient's vital signs were monitored continuously by radiology nursing throughout the procedure. Sedation Time: 60 minutes MEDICATIONS: None additional  FLUOROSCOPY TIME:  16 minutes for a total of 622 mGy CONTRAST:  146m OMNIPAQUE IOHEXOL 300 MG/ML  SOLN TECHNIQUE: Informed consent was obtained from the patient following explanation of the procedure, risks, benefits and alternatives. The patient understands, agrees and consents for the procedure. All questions were addressed. A time out was performed. Maximal barrier sterile technique utilized including caps, mask, sterile gowns, sterile gloves, large sterile drape, hand hygiene, and Betadine skin prep. The left groin was interrogated with ultrasound. The common femoral artery is found to be widely patent. An image was obtained and stored for the medical record. Local anesthesia was attained by infiltration with 1% lidocaine. Under real-time sonographic guidance, the vessel was punctured with a  21 gauge micropuncture needle. Using standard technique, the initial micro wire was exchanged through a transitional 5 [place] micro sheath for a working 0.035 inch Bentson wire. The micro sheath was then exchanged for a working 5 [place] vascular sheath. A C2 cobra catheter was advanced into the abdominal aorta over the wire. The catheter was initially advanced into the superior mesenteric artery and superior mesenteric arteriogram was performed. There is conventional anatomy. No replaced or accessory right hepatic artery. No evidence of active extravasation or significant collateralization through the pancreaticoduodenal arcade. The C2 cobra catheter was next advanced into the celiac artery. An arteriogram was performed. The gastroduodenal artery is elongated and slightly irregular in the mid segment likely secondary to external mass effect. There is no evidence of active extravasation. A renegade ST micro catheter was advanced coaxially over a fat and 16 wire. The micro catheter was advanced into the gastroduodenal artery. Arteriography was again performed. There are multiple branches arising from the gastroduodenal artery  providing supply to a hypervascular mass in the region the descending duodenum. The micro catheter was carefully advanced more distally into the proximal right gastroepiploic artery. Contrast injection was performed confirming the location of the catheter tip as well as confirming no additional supplied to the region of tumor. Coil embolization was then performed of the gastroduodenal artery using a combination of vortex, standard helical and soft interlock detachable micro coils ranging in size from 2-6 mm. Post embolization arteriography confirms successful embolization of the vessel with no further visible hypervascular blush of the duodenum tumor. The micro catheter was removed. Repeat celiac artery injection was performed confirming no additional collateral supply. The 5 French catheter was readvanced of the superior mesenteric artery and an additional angiogram was performed again confirming no collateral flow to the embolized territory. The catheters were removed. The vascular sheath was left in place for hemodynamic monitoring. Ultrasound was again used to interrogate the left groin. The left common femoral vein was identified. Local anesthesia was attained by infiltration with 1% lidocaine. The left common femoral vein was punctured with an 18 gauge needle. A wire was advanced into the inferior vena cava. The skin tract was dilated and an arrow triple-lumen central venous catheter advanced over the wire and position with the tip at the confluence of the IVC and left common iliac vein. The catheter was flushed, capped and secured with 0 Prolene suture. Sterile bandages were applied. COMPLICATIONS: None IMPRESSION: 1. No evidence of active extravasation. 2. 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 3. Coil embolization of the gastroduodenal artery. 4. Placement of a left femoral triple-lumen central venous catheter. The  catheter tip is at the confluence of the IVC and iliac vein and ready for immediate use. Signed, Criselda Peaches, MD [facility] Radiology Electronically Signed   By: Jacqulynn Cadet M.D.   On: 01/23/2015 15:50   Ir Angiogram Follow Up Study  01/23/2015  CLINICAL DATA:  65 year old male presents with acute life-threatening upper GI bleed secondary to an ulcerated mass encompassing the distal stomach and duodenum. 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 Endoscopic treatment options are limited and surgery is extremely high risk. Patient presents for emergent visceral arteriography and embolization. Additionally, central venous access is required. A triple-lumen central venous catheter will be placed during the course of the procedure. EXAM: SELECTIVE VISCERAL ARTERIOGRAPHY; IR EMBO ART VEN  HEMORR LYMPH EXTRAV INC GUIDE ROADMAPPING; IR LEFT FLOURO GUIDE CV LINE; ADDITIONAL ARTERIOGRAPHY; IR ULTRASOUND GUIDANCE VASC ACCESS LEFT; ARTERIOGRAPHY Date: 01/23/2015 PROCEDURE: 1. Ultrasound-guided puncture left common femoral artery 2. Catheterization of the superior mesenteric artery with arteriogram 3. Catheterization of the celiac artery with arteriogram 4. Catheterization of the gastroduodenal artery with arteriogram 5. Catheterization of the right gastroepiploic artery with arteriogram 6. Coil embolization of the gastroduodenal artery 7. Post embolization arteriogram 8. Limited left common femoral arteriogram 9. Ultrasound-guided puncture of the left common femoral vein 10. Placement of a triple-lumen central venous catheter via the left femoral vein with fluoroscopic guidance Interventional Radiologist:  Criselda Peaches, MD ANESTHESIA/SEDATION: Moderate (conscious) sedation was used. 1 mg Versed, 50 mcg Fentanyl were administered intravenously. The patient's vital signs  were monitored continuously by radiology nursing throughout the procedure. Sedation Time: 60 minutes MEDICATIONS: None additional FLUOROSCOPY TIME:  16 minutes for a total of 622 mGy CONTRAST:  151m OMNIPAQUE IOHEXOL 300 MG/ML  SOLN TECHNIQUE: Informed consent was obtained from the patient following explanation of the procedure, risks, benefits and alternatives. The patient understands, agrees and consents for the procedure. All questions were addressed. A time out was performed. Maximal barrier sterile technique utilized including caps, mask, sterile gowns, sterile gloves, large sterile drape, hand hygiene, and Betadine skin prep. The left groin was interrogated with ultrasound. The common femoral artery is found to be widely patent. An image was obtained and stored for the medical record. Local anesthesia was attained by infiltration with 1% lidocaine. Under real-time sonographic guidance, the vessel was punctured with a 21 gauge micropuncture needle. Using standard technique, the initial micro wire was exchanged through a transitional 5 [place] sheath for a working 0.035 inch Bentson wire. The micro sheath was then exchanged for a working 5 [place] sheath. A C2 cobra catheter was advanced into the abdominal aorta over the wire. The catheter was initially advanced into the superior mesenteric artery and superior mesenteric arteriogram was performed. There is conventional anatomy. No replaced or accessory right hepatic artery. No evidence of active extravasation or significant collateralization through the pancreaticoduodenal arcade. The C2 cobra catheter was next advanced into the celiac artery. An arteriogram was performed. The gastroduodenal artery is elongated and slightly irregular in the mid segment likely secondary to external mass effect. There is no evidence of active extravasation. A renegade ST micro catheter was advanced coaxially over a fat and 16 wire. The micro catheter was advanced  into the gastroduodenal artery. Arteriography was again performed. There are multiple branches arising from the gastroduodenal artery providing supply to a hypervascular mass in the region the descending duodenum. The micro catheter was carefully advanced more distally into the proximal right gastroepiploic artery. Contrast injection was performed confirming the location of the catheter tip as well as confirming no additional supplied to the region of tumor. Coil embolization was then performed of the gastroduodenal artery using a combination of vortex, standard helical and soft interlock detachable micro coils ranging in size from 2-6 mm. Post embolization arteriography confirms successful embolization of the vessel with no further visible hypervascular blush of the duodenum tumor. The micro catheter was removed. Repeat celiac artery injection was performed confirming no additional collateral supply. The 5 French catheter was readvanced of the superior mesenteric artery and an additional angiogram was performed again confirming no collateral flow to the embolized territory. The catheters were removed. The vascular sheath was left in place for hemodynamic monitoring. Ultrasound was again used to interrogate the  left groin. The left common femoral vein was identified. Local anesthesia was attained by infiltration with 1% lidocaine. The left common femoral vein was punctured with an 18 gauge needle. A wire was advanced into the inferior vena cava. The skin tract was dilated and an arrow triple-lumen central venous catheter advanced over the wire and position with the tip at the confluence of the IVC and left common iliac vein. The catheter was flushed, capped and secured with 0 Prolene suture. Sterile bandages were applied. COMPLICATIONS: None IMPRESSION: 1. No evidence of active extravasation. 2. Positive hypervascular tumor blush in the region of the descending duodenum with arterial supply from the gastroduodenal  artery. 3. Coil embolization of the gastroduodenal artery. 4. Placement of a left femoral triple-lumen central venous catheter. The catheter tip is at the confluence of the IVC and iliac vein and ready for immediate use. Signed, Criselda Peaches, MD [facility] Radiology Electronically Signed   By: Jacqulynn Cadet M.D.   On: 01/23/2015 15:50   Ir Fluoro Guide Cv Line Left  01/23/2015  CLINICAL DATA:  65 year old male presents with acute life-threatening upper GI bleed secondary to an ulcerated mass encompassing the distal stomach and duodenum. 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 Endoscopic treatment options are limited and surgery is extremely high risk. Patient presents for emergent visceral arteriography and embolization. Additionally, central venous access is required. A triple-lumen central venous catheter will be placed during the course of the procedure. EXAM: SELECTIVE VISCERAL ARTERIOGRAPHY; IR EMBO ART VEN HEMORR LYMPH EXTRAV INC GUIDE ROADMAPPING; IR LEFT FLOURO GUIDE CV LINE; ADDITIONAL ARTERIOGRAPHY; IR ULTRASOUND GUIDANCE VASC ACCESS LEFT; ARTERIOGRAPHY Date: 01/23/2015 PROCEDURE: 1. Ultrasound-guided puncture left common femoral artery 2. Catheterization of the superior mesenteric artery with arteriogram 3. Catheterization of the celiac artery with arteriogram 4. Catheterization of the gastroduodenal artery with arteriogram 5. Catheterization of the right gastroepiploic artery with arteriogram 6. Coil embolization of the gastroduodenal artery 7. Post embolization arteriogram 8. Limited left common femoral arteriogram 9. Ultrasound-guided puncture of the left common femoral vein 10. Placement of a triple-lumen central venous catheter via the left femoral vein with fluoroscopic guidance Interventional Radiologist:  Criselda Peaches, MD  ANESTHESIA/SEDATION: Moderate (conscious) sedation was used. 1 mg Versed, 50 mcg Fentanyl were administered intravenously. The patient's vital signs were monitored continuously by radiology nursing throughout the procedure. Sedation Time: 60 minutes MEDICATIONS: None additional FLUOROSCOPY TIME:  16 minutes for a total of 622 mGy CONTRAST:  12m OMNIPAQUE IOHEXOL 300 MG/ML  SOLN TECHNIQUE: Informed consent was obtained from the patient following explanation of the procedure, risks, benefits and alternatives. The patient understands, agrees and consents for the procedure. All questions were addressed. A time out was performed. Maximal barrier sterile technique utilized including caps, mask, sterile gowns, sterile gloves, large sterile drape, hand hygiene, and Betadine skin prep. The left groin was interrogated with ultrasound. The common femoral artery is found to be widely patent. An image was obtained and stored for the medical record. Local anesthesia was attained by infiltration with 1% lidocaine. Under real-time sonographic guidance, the vessel was punctured with a 21 gauge micropuncture needle. Using standard technique, the initial micro wire was exchanged through a transitional 5 [place] sheath for a working 0.035 inch Bentson wire. The micro sheath was then exchanged for a working 5 [place] sheath. A C2 cobra catheter was advanced into the abdominal aorta over the wire. The catheter  was initially advanced into the superior mesenteric artery and superior mesenteric arteriogram was performed. There is conventional anatomy. No replaced or accessory right hepatic artery. No evidence of active extravasation or significant collateralization through the pancreaticoduodenal arcade. The C2 cobra catheter was next advanced into the celiac artery. An arteriogram was performed. The gastroduodenal artery is elongated and slightly irregular in the mid segment likely secondary to external mass effect. There is  no evidence of active extravasation. A renegade ST micro catheter was advanced coaxially over a fat and 16 wire. The micro catheter was advanced into the gastroduodenal artery. Arteriography was again performed. There are multiple branches arising from the gastroduodenal artery providing supply to a hypervascular mass in the region the descending duodenum. The micro catheter was carefully advanced more distally into the proximal right gastroepiploic artery. Contrast injection was performed confirming the location of the catheter tip as well as confirming no additional supplied to the region of tumor. Coil embolization was then performed of the gastroduodenal artery using a combination of vortex, standard helical and soft interlock detachable micro coils ranging in size from 2-6 mm. Post embolization arteriography confirms successful embolization of the vessel with no further visible hypervascular blush of the duodenum tumor. The micro catheter was removed. Repeat celiac artery injection was performed confirming no additional collateral supply. The 5 French catheter was readvanced of the superior mesenteric artery and an additional angiogram was performed again confirming no collateral flow to the embolized territory. The catheters were removed. The vascular sheath was left in place for hemodynamic monitoring. Ultrasound was again used to interrogate the left groin. The left common femoral vein was identified. Local anesthesia was attained by infiltration with 1% lidocaine. The left common femoral vein was punctured with an 18 gauge needle. A wire was advanced into the inferior vena cava. The skin tract was dilated and an arrow triple-lumen central venous catheter advanced over the wire and position with the tip at the confluence of the IVC and left common iliac vein. The catheter was flushed, capped and secured with 0 Prolene suture. Sterile bandages were applied. COMPLICATIONS: None IMPRESSION: 1. No evidence of  active extravasation. 2. 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 3. Coil embolization of the gastroduodenal artery. 4. Placement of a left femoral triple-lumen central venous catheter. The catheter tip is at the confluence of the IVC and iliac vein and ready for immediate use. Signed, Criselda Peaches, MD [facility] Radiology Electronically Signed   By: Jacqulynn Cadet M.D.   On: 01/23/2015 15:50   Ir Fluoro Guide Cv Line Right  01/26/2015  CLINICAL DATA:  MANTLE CELL LYMPHOMA, ACCESS FOR CHEMOTHERAPY EXAM: RIGHT INTERNAL JUGULAR SINGLE LUMEN POWER PORT CATHETER INSERTION Date:  12/23/201612/23/2016 11:02 am Radiologist:  M. Daryll Brod, MD Guidance:  Ultrasound and fluoroscopic FLUOROSCOPY TIME:  48 seconds, for mGy MEDICATIONS AND MEDICAL HISTORY: 2 g AncefAdministered within 1 hour of the procedure.2 mg Versed, 100 mcg fentanyl ANESTHESIA/SEDATION: 30 minutes CONTRAST:  None COMPLICATIONS: None immediate PROCEDURE: Informed consent was obtained from the patient following explanation of the procedure, risks, benefits and alternatives. The patient understands, agrees and consents for the procedure. All questions were addressed. A time out was performed. Maximal barrier sterile technique utilized including caps, mask, sterile gowns, sterile gloves, large sterile drape, hand hygiene, and 2% chlorhexidine scrub. Under sterile conditions and local anesthesia, right internal jugular micropuncture venous access was performed. Access  was performed with ultrasound. Images were obtained for documentation. A guide wire was inserted followed by a transitional dilator. This allowed insertion of a guide wire and catheter into the IVC. Measurements were obtained from the SVC / RA junction back to the right IJ venotomy site. In the right infraclavicular chest, a subcutaneous pocket  was created over the second anterior rib. This was done under sterile conditions and local anesthesia. 1% lidocaine with epinephrine was utilized for this. A 2.5 cm incision was made in the skin. Blunt dissection was performed to create a subcutaneous pocket over the right pectoralis major muscle. The pocket was flushed with saline vigorously. There was adequate hemostasis. The port catheter was assembled and checked for leakage. The port catheter was secured in the pocket with two retention sutures. The tubing was tunneled subcutaneously to the right venotomy site and inserted into the SVC/RA junction through a valved peel-away sheath. Position was confirmed with fluoroscopy. Images were obtained for documentation. The patient tolerated the procedure well. No immediate complications. Incisions were closed in a two layer fashion with 4 - 0 Vicryl suture. Dermabond was applied to the skin. The port catheter was accessed, blood was aspirated followed by saline and heparin flushes. Needle was removed. A dry sterile dressing was applied. IMPRESSION: Ultrasound and fluoroscopically guided right internal jugular single lumen power port catheter insertion. Tip in the SVC/RA junction. Catheter ready for use. Electronically Signed   By: Jerilynn Mages.  Shick M.D.   On: 01/26/2015 11:25   Ir US Guide Vasc Access Left  01/23/2015  CLINICAL DATA:  65 year old male presents with acute life-threatening upper GI bleed secondary to an ulcerated mass encompassing the distal stomach and duodenum. 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 Endoscopic treatment options are limited and surgery is extremely high risk. Patient presents for emergent visceral arteriography and embolization. Additionally, central venous access is required. A triple-lumen central venous catheter will be placed during the course of the procedure. EXAM: SELECTIVE VISCERAL  ARTERIOGRAPHY; IR EMBO ART VEN HEMORR LYMPH EXTRAV INC GUIDE ROADMAPPING; IR LEFT FLOURO GUIDE CV LINE; ADDITIONAL ARTERIOGRAPHY; IR ULTRASOUND GUIDANCE VASC ACCESS LEFT; ARTERIOGRAPHY Date: 01/23/2015 PROCEDURE: 1. Ultrasound-guided puncture left common femoral artery 2. Catheterization of the superior mesenteric artery with arteriogram 3. Catheterization of the celiac artery with arteriogram 4. Catheterization of the gastroduodenal artery with arteriogram 5. Catheterization of the right gastroepiploic artery with arteriogram 6. Coil embolization of the gastroduodenal artery 7. Post embolization arteriogram 8. Limited left common femoral arteriogram 9. Ultrasound-guided puncture of the left common femoral vein 10. Placement of a triple-lumen central venous catheter via the left femoral vein with fluoroscopic guidance Interventional Radiologist:  Criselda Peaches, MD ANESTHESIA/SEDATION: Moderate (conscious) sedation was used. 1 mg Versed, 50 mcg Fentanyl were administered intravenously. The patient's vital signs were monitored continuously by radiology nursing throughout the procedure. Sedation Time: 60 minutes MEDICATIONS: None additional FLUOROSCOPY TIME:  16 minutes for a total of 622 mGy CONTRAST:  189m OMNIPAQUE IOHEXOL 300 MG/ML  SOLN TECHNIQUE: Informed consent was obtained from the patient following explanation of the procedure, risks, benefits and alternatives. The patient understands, agrees and consents for the procedure. All questions were addressed. A time out was performed. Maximal barrier sterile technique utilized including caps, mask, sterile gowns, sterile gloves, large sterile drape, hand hygiene, and Betadine skin prep. The left groin was interrogated with ultrasound. The common femoral artery is found to be widely patent. An image was obtained  and stored for the medical record. Local anesthesia was attained by infiltration with 1% lidocaine. Under real-time sonographic guidance, the vessel  was punctured with a 21 gauge micropuncture needle. Using standard technique, the initial micro wire was exchanged through a transitional 5 [place] micro sheath for a working 0.035 inch Bentson wire. The micro sheath was then exchanged for a working 5 [place] vascular sheath. A C2 cobra catheter was advanced into the abdominal aorta over the wire. The catheter was initially advanced into the superior mesenteric artery and superior mesenteric arteriogram was performed. There is conventional anatomy. No replaced or accessory right hepatic artery. No evidence of active extravasation or significant collateralization through the pancreaticoduodenal arcade. The C2 cobra catheter was next advanced into the celiac artery. An arteriogram was performed. The gastroduodenal artery is elongated and slightly irregular in the mid segment likely secondary to external mass effect. There is no evidence of active extravasation. A renegade ST micro catheter was advanced coaxially over a fat and 16 wire. The micro catheter was advanced into the gastroduodenal artery. Arteriography was again performed. There are multiple branches arising from the gastroduodenal artery providing supply to a hypervascular mass in the region the descending duodenum. The micro catheter was carefully advanced more distally into the proximal right gastroepiploic artery. Contrast injection was performed confirming the location of the catheter tip as well as confirming no additional supplied to the region of tumor. Coil embolization was then performed of the gastroduodenal artery using a combination of vortex, standard helical and soft interlock detachable micro coils ranging in size from 2-6 mm. Post embolization arteriography confirms successful embolization of the vessel with no further visible hypervascular blush of the duodenum tumor. The micro catheter was removed. Repeat celiac artery injection was performed confirming no additional collateral supply. The 5  French catheter was readvanced of the superior mesenteric artery and an additional angiogram was performed again confirming no collateral flow to the embolized territory. The catheters were removed. The vascular sheath was left in place for hemodynamic monitoring. Ultrasound was again used to interrogate the left groin. The left common femoral vein was identified. Local anesthesia was attained by infiltration with 1% lidocaine. The left common femoral vein was punctured with an 18 gauge needle. A wire was advanced into the inferior vena cava. The skin tract was dilated and an arrow triple-lumen central venous catheter advanced over the wire and position with the tip at the confluence of the IVC and left common iliac vein. The catheter was flushed, capped and secured with 0 Prolene suture. Sterile bandages were applied. COMPLICATIONS: None IMPRESSION: 1. No evidence of active extravasation. 2. 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 3. Coil embolization of the gastroduodenal artery. 4. Placement of a left femoral triple-lumen central venous catheter. The catheter tip is at the confluence of the IVC and iliac vein and ready for immediate use. Signed, Criselda Peaches, MD [facility] Radiology Electronically Signed   By: Jacqulynn Cadet M.D.   On: 01/23/2015 15:50   Ir US Guide Vasc Access Left  01/23/2015  CLINICAL DATA:  65 year old male presents with acute life-threatening upper GI bleed secondary to an ulcerated mass encompassing the distal stomach and duodenum. 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 Endoscopic treatment options are limited and surgery is extremely high risk. Patient presents for emergent visceral arteriography and embolization. Additionally, central  venous access is  required. A triple-lumen central venous catheter will be placed during the course of the procedure. EXAM: SELECTIVE VISCERAL ARTERIOGRAPHY; IR EMBO ART VEN HEMORR LYMPH EXTRAV INC GUIDE ROADMAPPING; IR LEFT FLOURO GUIDE CV LINE; ADDITIONAL ARTERIOGRAPHY; IR ULTRASOUND GUIDANCE VASC ACCESS LEFT; ARTERIOGRAPHY Date: 01/23/2015 PROCEDURE: 1. Ultrasound-guided puncture left common femoral artery 2. Catheterization of the superior mesenteric artery with arteriogram 3. Catheterization of the celiac artery with arteriogram 4. Catheterization of the gastroduodenal artery with arteriogram 5. Catheterization of the right gastroepiploic artery with arteriogram 6. Coil embolization of the gastroduodenal artery 7. Post embolization arteriogram 8. Limited left common femoral arteriogram 9. Ultrasound-guided puncture of the left common femoral vein 10. Placement of a triple-lumen central venous catheter via the left femoral vein with fluoroscopic guidance Interventional Radiologist:  Criselda Peaches, MD ANESTHESIA/SEDATION: Moderate (conscious) sedation was used. 1 mg Versed, 50 mcg Fentanyl were administered intravenously. The patient's vital signs were monitored continuously by radiology nursing throughout the procedure. Sedation Time: 60 minutes MEDICATIONS: None additional FLUOROSCOPY TIME:  16 minutes for a total of 622 mGy CONTRAST:  158m OMNIPAQUE IOHEXOL 300 MG/ML  SOLN TECHNIQUE: Informed consent was obtained from the patient following explanation of the procedure, risks, benefits and alternatives. The patient understands, agrees and consents for the procedure. All questions were addressed. A time out was performed. Maximal barrier sterile technique utilized including caps, mask, sterile gowns, sterile gloves, large sterile drape, hand hygiene, and Betadine skin prep. The left groin was interrogated with ultrasound. The common femoral artery is found to be widely patent. An image was obtained and stored for the  medical record. Local anesthesia was attained by infiltration with 1% lidocaine. Under real-time sonographic guidance, the vessel was punctured with a 21 gauge micropuncture needle. Using standard technique, the initial micro wire was exchanged through a transitional 5 [place] sheath for a working 0.035 inch Bentson wire. The micro sheath was then exchanged for a working 5 [place] sheath. A C2 cobra catheter was advanced into the abdominal aorta over the wire. The catheter was initially advanced into the superior mesenteric artery and superior mesenteric arteriogram was performed. There is conventional anatomy. No replaced or accessory right hepatic artery. No evidence of active extravasation or significant collateralization through the pancreaticoduodenal arcade. The C2 cobra catheter was next advanced into the celiac artery. An arteriogram was performed. The gastroduodenal artery is elongated and slightly irregular in the mid segment likely secondary to external mass effect. There is no evidence of active extravasation. A renegade ST micro catheter was advanced coaxially over a fat and 16 wire. The micro catheter was advanced into the gastroduodenal artery. Arteriography was again performed. There are multiple branches arising from the gastroduodenal artery providing supply to a hypervascular mass in the region the descending duodenum. The micro catheter was carefully advanced more distally into the proximal right gastroepiploic artery. Contrast injection was performed confirming the location of the catheter tip as well as confirming no additional supplied to the region of tumor. Coil embolization was then performed of the gastroduodenal artery using a combination of vortex, standard helical and soft interlock detachable micro coils ranging in size from 2-6 mm. Post embolization arteriography confirms successful embolization of the vessel with no further visible hypervascular blush of the duodenum  tumor. The micro catheter was removed. Repeat celiac artery injection was performed confirming no additional collateral supply. The 5 French catheter was readvanced of the superior mesenteric artery and an additional angiogram was performed again confirming no  collateral flow to the embolized territory. The catheters were removed. The vascular sheath was left in place for hemodynamic monitoring. Ultrasound was again used to interrogate the left groin. The left common femoral vein was identified. Local anesthesia was attained by infiltration with 1% lidocaine. The left common femoral vein was punctured with an 18 gauge needle. A wire was advanced into the inferior vena cava. The skin tract was dilated and an arrow triple-lumen central venous catheter advanced over the wire and position with the tip at the confluence of the IVC and left common iliac vein. The catheter was flushed, capped and secured with 0 Prolene suture. Sterile bandages were applied. COMPLICATIONS: None IMPRESSION: 1. No evidence of active extravasation. 2. 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 3. Coil embolization of the gastroduodenal artery. 4. Placement of a left femoral triple-lumen central venous catheter. The catheter tip is at the confluence of the IVC and iliac vein and ready for immediate use. Signed, Criselda Peaches, MD [facility] Radiology Electronically Signed   By: Jacqulynn Cadet M.D.   On: 01/23/2015 15:50   Ir US Guide Vasc Access Right  01/26/2015  CLINICAL DATA:  MANTLE CELL LYMPHOMA, ACCESS FOR CHEMOTHERAPY EXAM: RIGHT INTERNAL JUGULAR SINGLE LUMEN POWER PORT CATHETER INSERTION Date:  12/23/201612/23/2016 11:02 am Radiologist:  M. Daryll Brod, MD Guidance:  Ultrasound and fluoroscopic FLUOROSCOPY TIME:  48 seconds, for mGy MEDICATIONS AND MEDICAL HISTORY: 2 g AncefAdministered  within 1 hour of the procedure.2 mg Versed, 100 mcg fentanyl ANESTHESIA/SEDATION: 30 minutes CONTRAST:  None COMPLICATIONS: None immediate PROCEDURE: Informed consent was obtained from the patient following explanation of the procedure, risks, benefits and alternatives. The patient understands, agrees and consents for the procedure. All questions were addressed. A time out was performed. Maximal barrier sterile technique utilized including caps, mask, sterile gowns, sterile gloves, large sterile drape, hand hygiene, and 2% chlorhexidine scrub. Under sterile conditions and local anesthesia, right internal jugular micropuncture venous access was performed. Access was performed with ultrasound. Images were obtained for documentation. A guide wire was inserted followed by a transitional dilator. This allowed insertion of a guide wire and catheter into the IVC. Measurements were obtained from the SVC / RA junction back to the right IJ venotomy site. In the right infraclavicular chest, a subcutaneous pocket was created over the second anterior rib. This was done under sterile conditions and local anesthesia. 1% lidocaine with epinephrine was utilized for this. A 2.5 cm incision was made in the skin. Blunt dissection was performed to create a subcutaneous pocket over the right pectoralis major muscle. The pocket was flushed with saline vigorously. There was adequate hemostasis. The port catheter was assembled and checked for leakage. The port catheter was secured in the pocket with two retention sutures. The tubing was tunneled subcutaneously to the right venotomy site and inserted into the SVC/RA junction through a valved peel-away sheath. Position was confirmed with fluoroscopy. Images were obtained for documentation. The patient tolerated the procedure well. No immediate complications. Incisions were closed in a two layer fashion with 4 - 0 Vicryl suture. Dermabond was applied to the skin. The port catheter was  accessed, blood was aspirated followed by saline and heparin flushes. Needle was removed. A dry sterile dressing was applied. IMPRESSION: Ultrasound and fluoroscopically guided right internal jugular single lumen power port catheter insertion. Tip in the SVC/RA junction. Catheter ready for use. Electronically Signed  By: Eugenie Filler M.D.   On: 01/26/2015 11:25   Ct Biopsy  02/06/2015  INDICATION: Lymphoma. Please perform CT-guided bone marrow biopsy for tissue diagnostic purposes. EXAM: CT-GUIDED BONE MARROW BIOPSY AND ASPIRATION MEDICATIONS: Fentanyl 75 mcg IV; Versed 1.5 mg IV ANESTHESIA/SEDATION: Sedation Time 5 minutes CONTRAST:  None COMPLICATIONS: None immediate. PROCEDURE: Informed consent was obtained from the patient following an explanation of the procedure, risks, benefits and alternatives. The patient understands, agrees and consents for the procedure. All questions were addressed. A time out was performed prior to the initiation of the procedure. The patient was positioned prone and non-contrast localization CT was performed of the pelvis to demonstrate the iliac marrow spaces. The operative site was prepped and draped in the usual sterile fashion. Under sterile conditions and local anesthesia, a 22 gauge spinal needle was utilized for procedural planning. Next, an 11 gauge coaxial bone biopsy needle was advanced into the right iliac marrow space. Needle position was confirmed with CT imaging. Initially, bone marrow aspiration was performed. Next, a bone marrow biopsy was obtained with the 11 gauge outer bone marrow device. Samples were prepared with the cytotechnologist and deemed adequate. The needle was removed intact. Hemostasis was obtained with compression and a dressing was placed. The patient tolerated the procedure well without immediate post procedural complication. IMPRESSION: Successful CT guided right iliac bone marrow aspiration and core biopsy. Electronically Signed   By: Sandi Mariscal  M.D.   On: 02/06/2015 10:18   Portable Chest Xray  01/24/2015  CLINICAL DATA:  Respiratory failure. EXAM: PORTABLE CHEST 1 VIEW COMPARISON:  01/23/2015 FINDINGS: Endotracheal tube and NG tube in stable position. Cardiomegaly with normal pulmonary vascularity.Persistent increased density noted over the right Lower lung, most likely layering pleural effusion. Underlying pulmonary infiltrate cannot be excluded. Small left pleural effusion cannot be excluded. Low lung volumes basilar atelectasis. No pneumothorax. IMPRESSION: 1. Lines and tubes in stable position. 2. Persistent increased density of the right lower lung, most likely layering pleural effusion. Underlying infiltrate cannot be excluded. 3. Low lung volumes with basilar atelectasis. Electronically Signed   By: Marcello Moores  Register   On: 01/24/2015 07:39   Dg Chest Portable 1 View  01/23/2015  CLINICAL DATA:  Assess orogastric tube positioning EXAM: PORTABLE CHEST 1 VIEW COMPARISON:  Portable chest x-ray of earlier today at 8:01 a.m. FINDINGS: The esophagogastric tube tip projects below the inferior margin of the image. There is no evidence of cord ongoing within the esophagus. The endotracheal tube tip lies 4.1 cm above the carina. The cardiac silhouette is mildly enlarged but stable. The central pulmonary vascularity is engorged especially on the right. There is mild to moderate pulmonary interstitial edema on the right. There is no pleural effusion or pneumothorax. The observed bony thorax exhibits no acute abnormality. External pacemaker -defibrillator pads. Are present IMPRESSION: 1. The esophagogastric tube tip projects below the inferior margin of the image with no evidence of coiling within the esophagus. The endotracheal tube is in appropriate position. 2. Persistently increased perihilar interstitial density especially on the right consistent with pulmonary interstitial edema. There may be pleural fluid on the right layering posteriorly  contributing to the findings here. Electronically Signed   By: David  Martinique M.D.   On: 01/23/2015 09:22   Dg Chest Port 1 View  01/23/2015  CLINICAL DATA:  Status post intubation.  Intra abdominal mass. EXAM: PORTABLE CHEST 1 VIEW COMPARISON:  CT chest 01/21/2015.  CT abdomen 01/21/2015. FINDINGS: The patient has a new endotracheal tube  in place with the tip 3.5 cm above the carina. Hazy opacity over the right chest is consistent with layering pleural effusion. Smaller left pleural effusion is noted. No consolidative process or pneumothorax. Heart size is upper normal. IMPRESSION: ET tube in good position with the tip projecting 3.5 cm above the carina. Right greater than left pleural effusions. Electronically Signed   By: Inge Rise M.D.   On: 01/23/2015 08:19   [place] Guide Roadmapping  01/23/2015  CLINICAL DATA:  65 year old male presents with acute life-threatening upper GI bleed secondary to an ulcerated mass encompassing the distal stomach and duodenum. 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 Endoscopic treatment options are limited and surgery is extremely high risk. Patient presents for emergent visceral arteriography and embolization. Additionally, central venous access is required. A triple-lumen central venous catheter will be placed during the course of the procedure. EXAM: SELECTIVE VISCERAL ARTERIOGRAPHY; IR EMBO ART VEN HEMORR LYMPH EXTRAV INC GUIDE ROADMAPPING; IR LEFT FLOURO GUIDE CV LINE; ADDITIONAL ARTERIOGRAPHY; IR ULTRASOUND GUIDANCE VASC ACCESS LEFT; ARTERIOGRAPHY Date: 01/23/2015 PROCEDURE: 1. Ultrasound-guided puncture left common femoral artery 2. Catheterization of the superior mesenteric artery with arteriogram 3. Catheterization of the celiac artery with arteriogram 4. Catheterization of the gastroduodenal artery with arteriogram 5. Catheterization of  the right gastroepiploic artery with arteriogram 6. Coil embolization of the gastroduodenal artery 7. Post embolization arteriogram 8. Limited left common femoral arteriogram 9. Ultrasound-guided puncture of the left common femoral vein 10. Placement of a triple-lumen central venous catheter via the left femoral vein with fluoroscopic guidance Interventional Radiologist:  Criselda Peaches, MD ANESTHESIA/SEDATION: Moderate (conscious) sedation was used. 1 mg Versed, 50 mcg Fentanyl were administered intravenously. The patient's vital signs were monitored continuously by radiology nursing throughout the procedure. Sedation Time: 60 minutes MEDICATIONS: None additional FLUOROSCOPY TIME:  16 minutes for a total of 622 mGy CONTRAST:  131m OMNIPAQUE IOHEXOL 300 MG/ML  SOLN TECHNIQUE: Informed consent was obtained from the patient following explanation of the procedure, risks, benefits and alternatives. The patient understands, agrees and consents for the procedure. All questions were addressed. A time out was performed. Maximal barrier sterile technique utilized including caps, mask, sterile gowns, sterile gloves, large sterile drape, hand hygiene, and Betadine skin prep. The left groin was interrogated with ultrasound. The common femoral artery is found to be widely patent. An image was obtained and stored for the medical record. Local anesthesia was attained by infiltration with 1% lidocaine. Under real-time sonographic guidance, the vessel was punctured with a 21 gauge micropuncture needle. Using standard technique, the initial micro wire was exchanged through a transitional 5 [place] sheath for a working 0.035 inch Bentson wire. The micro sheath was then exchanged for a working 5 [place] sheath. A C2 cobra catheter was advanced into the abdominal aorta over the wire. The catheter was initially advanced into the superior mesenteric artery and superior mesenteric arteriogram was performed. There is  conventional anatomy. No replaced or accessory right hepatic artery. No evidence of active extravasation or significant collateralization through the pancreaticoduodenal arcade. The C2 cobra catheter was next advanced into the celiac artery. An arteriogram was performed. The gastroduodenal artery is elongated and slightly irregular in the mid segment likely secondary to external mass effect. There is no evidence of active extravasation. A renegade ST micro catheter was advanced coaxially over a fat and 16 wire. The micro catheter was advanced  into the gastroduodenal artery. Arteriography was again performed. There are multiple branches arising from the gastroduodenal artery providing supply to a hypervascular mass in the region the descending duodenum. The micro catheter was carefully advanced more distally into the proximal right gastroepiploic artery. Contrast injection was performed confirming the location of the catheter tip as well as confirming no additional supplied to the region of tumor. Coil embolization was then performed of the gastroduodenal artery using a combination of vortex, standard helical and soft interlock detachable micro coils ranging in size from 2-6 mm. Post embolization arteriography confirms successful embolization of the vessel with no further visible hypervascular blush of the duodenum tumor. The micro catheter was removed. Repeat celiac artery injection was performed confirming no additional collateral supply. The 5 French catheter was readvanced of the superior mesenteric artery and an additional angiogram was performed again confirming no collateral flow to the embolized territory. The catheters were removed. The vascular sheath was left in place for hemodynamic monitoring. Ultrasound was again used to interrogate the left groin. The left common femoral vein was identified. Local anesthesia was attained by infiltration with 1% lidocaine. The left common femoral vein was punctured with  an 18 gauge needle. A wire was advanced into the inferior vena cava. The skin tract was dilated and an arrow triple-lumen central venous catheter advanced over the wire and position with the tip at the confluence of the IVC and left common iliac vein. The catheter was flushed, capped and secured with 0 Prolene suture. Sterile bandages were applied. COMPLICATIONS: None IMPRESSION: 1. No evidence of active extravasation. 2. 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 3. Coil embolization of the gastroduodenal artery. 4. Placement of a left femoral triple-lumen central venous catheter. The catheter tip is at the confluence of the IVC and iliac vein and ready for immediate use. Signed, Criselda Peaches, MD [facility] Radiology Electronically Signed   By: Jacqulynn Cadet M.D.   On: 01/23/2015 15:50   US Abdomen Limited Ruq  01/19/2015  CLINICAL DATA:  Transaminitis. History of duodenal ulcer with adherent clot. EXAM: US ABDOMEN LIMITED - RIGHT UPPER QUADRANT COMPARISON:  None. FINDINGS: Gallbladder: No gallstones or wall thickening visualized. Minimal dependent sludge. No sonographic Murphy sign noted. Small amounts of pericholecystic free fluid. Common bile duct: Diameter: 4.2 mm. Liver: Ill-defined heterogeneous echogenicity in the region of the pancreatic head and porta hepatis, borders not well-defined. Mildly heterogeneous hepatic parenchyma. No intrahepatic biliary ductal dilatation. Normal directional flow in the main portal vein. Right pleural effusion, incidentally noted. IMPRESSION: 1. Heterogeneous echogenicity in the region of the porta hepatis and pancreatic head, difficult to separate from the adjacent liver parenchyma. This may reflect hematoma/blood products in the setting of bleeding duodenal ulcer versus pancreatic head or focal liver lesion. There is no biliary  ductal dilatation. Recommend further characterization with cross-sectional imaging, contrast-enhanced CT or MRI. 2. Minimal sludge in the gallbladder, no wall thickening or gallstones. Small amount of pericholecystic fluid. No sonographic Murphy sign or findings of acute cholecystitis. Electronically Signed   By: Jeb Levering M.D.   On: 01/19/2015 20:48    Microbiology: No results found for this or any previous visit (from the past 240 hour(s)).   Labs: Basic Metabolic Panel:  Recent Labs Lab 02/07/15 1809 02/08/15 0705  NA 137 138  K 4.4 4.0  CL 100* 102  CO2 27 27  GLUCOSE 108* 115*  BUN 28* 22*  CREATININE 1.24 1.11  CALCIUM 8.9 8.9   Liver Function Tests: No results for input(s): AST, ALT, ALKPHOS, BILITOT, PROT, ALBUMIN in the last 168 hours. No results for input(s): LIPASE, AMYLASE in the last 168 hours. No results for input(s): AMMONIA in the last 168 hours. CBC:  Recent Labs Lab 02/06/15 0720 02/07/15 1809 02/08/15 0705 02/08/15 1402  WBC 7.8 8.8 7.1  --   NEUTROABS  --  7.0  --   --   HGB 9.4* 7.5* 9.9* 10.0*  HCT 29.2* 22.6* 30.0* 30.3*  MCV 86.6 84.3 86.5  --   PLT 556* 538* 505*  --    Cardiac Enzymes:  Recent Labs Lab 02/07/15 1809  TROPONINI <0.03   BNP: BNP (last 3 results) No results for input(s): BNP in the last 8760 hours.  ProBNP (last 3 results) No results for input(s): PROBNP in the last 8760 hours.  CBG: No results for input(s): GLUCAP in the last 168 hours.   Signed:  CHIU, Orpah Melter  [facility] 02/08/2015, 4:36 PM

## 2015-02-09 ENCOUNTER — Encounter: Payer: Self-pay | Admitting: Oncology

## 2015-02-09 ENCOUNTER — Other Ambulatory Visit: Payer: Self-pay | Admitting: *Deleted

## 2015-02-09 ENCOUNTER — Other Ambulatory Visit (HOSPITAL_BASED_OUTPATIENT_CLINIC_OR_DEPARTMENT_OTHER): Payer: BLUE CROSS/BLUE SHIELD

## 2015-02-09 ENCOUNTER — Ambulatory Visit (HOSPITAL_BASED_OUTPATIENT_CLINIC_OR_DEPARTMENT_OTHER): Payer: BLUE CROSS/BLUE SHIELD

## 2015-02-09 ENCOUNTER — Other Ambulatory Visit: Payer: BLUE CROSS/BLUE SHIELD

## 2015-02-09 ENCOUNTER — Telehealth: Payer: Self-pay | Admitting: Oncology

## 2015-02-09 ENCOUNTER — Inpatient Hospital Stay: Payer: BLUE CROSS/BLUE SHIELD | Admitting: Oncology

## 2015-02-09 ENCOUNTER — Ambulatory Visit (HOSPITAL_BASED_OUTPATIENT_CLINIC_OR_DEPARTMENT_OTHER): Payer: BLUE CROSS/BLUE SHIELD | Admitting: Oncology

## 2015-02-09 VITALS — BP 120/72 | HR 78 | Temp 98.0°F | Resp 18

## 2015-02-09 VITALS — BP 115/74 | HR 83 | Temp 98.5°F | Resp 18 | Ht 70.0 in | Wt 192.5 lb

## 2015-02-09 DIAGNOSIS — C8315 Mantle cell lymphoma, lymph nodes of inguinal region and lower limb: Secondary | ICD-10-CM

## 2015-02-09 DIAGNOSIS — Z5111 Encounter for antineoplastic chemotherapy: Secondary | ICD-10-CM

## 2015-02-09 DIAGNOSIS — J9859 Other diseases of mediastinum, not elsewhere classified: Secondary | ICD-10-CM

## 2015-02-09 DIAGNOSIS — D62 Acute posthemorrhagic anemia: Secondary | ICD-10-CM

## 2015-02-09 DIAGNOSIS — K922 Gastrointestinal hemorrhage, unspecified: Secondary | ICD-10-CM

## 2015-02-09 DIAGNOSIS — C8319 Mantle cell lymphoma, extranodal and solid organ sites: Secondary | ICD-10-CM

## 2015-02-09 DIAGNOSIS — R1900 Intra-abdominal and pelvic swelling, mass and lump, unspecified site: Secondary | ICD-10-CM

## 2015-02-09 DIAGNOSIS — D649 Anemia, unspecified: Secondary | ICD-10-CM | POA: Diagnosis not present

## 2015-02-09 DIAGNOSIS — E785 Hyperlipidemia, unspecified: Secondary | ICD-10-CM

## 2015-02-09 DIAGNOSIS — I10 Essential (primary) hypertension: Secondary | ICD-10-CM

## 2015-02-09 DIAGNOSIS — K276 Chronic or unspecified peptic ulcer, site unspecified, with both hemorrhage and perforation: Secondary | ICD-10-CM | POA: Insufficient documentation

## 2015-02-09 DIAGNOSIS — D630 Anemia in neoplastic disease: Secondary | ICD-10-CM | POA: Insufficient documentation

## 2015-02-09 LAB — COMPREHENSIVE METABOLIC PANEL
ALK PHOS: 57 U/L (ref 40–150)
ALT: 11 U/L (ref 0–55)
AST: 14 U/L (ref 5–34)
Albumin: 3.3 g/dL — ABNORMAL LOW (ref 3.5–5.0)
Anion Gap: 7 mEq/L (ref 3–11)
BUN: 18.8 mg/dL (ref 7.0–26.0)
CALCIUM: 8.9 mg/dL (ref 8.4–10.4)
CHLORIDE: 104 meq/L (ref 98–109)
CO2: 25 mEq/L (ref 22–29)
Creatinine: 1 mg/dL (ref 0.7–1.3)
EGFR: 90 mL/min/{1.73_m2} — AB (ref >90)
Glucose: 117 mg/dl (ref 70–140)
POTASSIUM: 4.6 meq/L (ref 3.5–5.1)
SODIUM: 136 meq/L (ref 136–145)
Total Bilirubin: 0.32 mg/dL (ref 0.20–1.20)
Total Protein: 6.9 g/dL (ref 6.4–8.3)

## 2015-02-09 LAB — CBC & DIFF AND RETIC
BASO%: 0.3 % (ref 0.0–2.0)
Basophils Absolute: 0 10*3/uL (ref 0.0–0.1)
EOS%: 0 % (ref 0.0–7.0)
Eosinophils Absolute: 0 10*3/uL (ref 0.0–0.5)
HCT: 29.4 % — ABNORMAL LOW (ref 38.4–49.9)
HGB: 9.7 g/dL — ABNORMAL LOW (ref 13.0–17.1)
Immature Retic Fract: 12.1 % — ABNORMAL HIGH (ref 3.00–10.60)
LYMPH#: 0.7 10*3/uL — AB (ref 0.9–3.3)
LYMPH%: 8.3 % — ABNORMAL LOW (ref 14.0–49.0)
MCH: 28 pg (ref 27.2–33.4)
MCHC: 33 g/dL (ref 32.0–36.0)
MCV: 84.7 fL (ref 79.3–98.0)
MONO#: 0.1 10*3/uL (ref 0.1–0.9)
MONO%: 0.9 % (ref 0.0–14.0)
NEUT%: 90.5 % — ABNORMAL HIGH (ref 39.0–75.0)
NEUTROS ABS: 7.1 10*3/uL — AB (ref 1.5–6.5)
NRBC: 0 % (ref 0–0)
PLATELETS: 472 10*3/uL — AB (ref 140–400)
RBC: 3.47 10*6/uL — ABNORMAL LOW (ref 4.20–5.82)
RDW: 14.9 % — AB (ref 11.0–14.6)
RETIC %: 2.32 % — AB (ref 0.80–1.80)
Retic Ct Abs: 80.5 10*3/uL (ref 34.80–93.90)
WBC: 7.9 10*3/uL (ref 4.0–10.3)

## 2015-02-09 LAB — TYPE AND SCREEN
ABO/RH(D): AB POS
Antibody Screen: NEGATIVE
UNIT DIVISION: 0
Unit division: 0

## 2015-02-09 LAB — LACTATE DEHYDROGENASE: LDH: 261 U/L — AB (ref 125–245)

## 2015-02-09 LAB — FERRITIN: FERRITIN: 85 ng/mL (ref 22–316)

## 2015-02-09 MED ORDER — LORAZEPAM 0.5 MG PO TABS: 0.5000 mg | ORAL_TABLET | Freq: Four times a day (QID) | ORAL | Status: DC | PRN

## 2015-02-09 MED ORDER — DOXORUBICIN HCL CHEMO IV INJECTION 2 MG/ML
50.0000 mg/m2 | Freq: Once | INTRAVENOUS | Status: AC
  Administered 2015-02-09: 110 mg via INTRAVENOUS
  Filled 2015-02-09: qty 55

## 2015-02-09 MED ORDER — SODIUM CHLORIDE 0.9 % IV SOLN
Freq: Once | INTRAVENOUS | Status: AC
  Administered 2015-02-09: 10:00:00 via INTRAVENOUS
  Filled 2015-02-09: qty 8

## 2015-02-09 MED ORDER — ACETAMINOPHEN 325 MG PO TABS
ORAL_TABLET | ORAL | Status: AC
  Filled 2015-02-09: qty 2

## 2015-02-09 MED ORDER — DIPHENHYDRAMINE HCL 25 MG PO CAPS
50.0000 mg | ORAL_CAPSULE | Freq: Once | ORAL | Status: AC
  Administered 2015-02-09: 50 mg via ORAL

## 2015-02-09 MED ORDER — VINCRISTINE SULFATE CHEMO INJECTION 1 MG/ML
2.0000 mg | Freq: Once | INTRAVENOUS | Status: AC
  Administered 2015-02-09: 2 mg via INTRAVENOUS
  Filled 2015-02-09: qty 2

## 2015-02-09 MED ORDER — PEGFILGRASTIM 6 MG/0.6ML ~~LOC~~ PSKT
6.0000 mg | PREFILLED_SYRINGE | Freq: Once | SUBCUTANEOUS | Status: AC
  Administered 2015-02-09: 6 mg via SUBCUTANEOUS
  Filled 2015-02-09: qty 0.6

## 2015-02-09 MED ORDER — SODIUM CHLORIDE 0.9 % IV SOLN
375.0000 mg/m2 | Freq: Once | INTRAVENOUS | Status: AC
  Administered 2015-02-09: 800 mg via INTRAVENOUS
  Filled 2015-02-09: qty 80

## 2015-02-09 MED ORDER — DIPHENHYDRAMINE HCL 25 MG PO CAPS
ORAL_CAPSULE | ORAL | Status: AC
  Filled 2015-02-09: qty 2

## 2015-02-09 MED ORDER — SODIUM CHLORIDE 0.9 % IV SOLN
750.0000 mg/m2 | Freq: Once | INTRAVENOUS | Status: AC
  Administered 2015-02-09: 1640 mg via INTRAVENOUS
  Filled 2015-02-09: qty 82

## 2015-02-09 MED ORDER — PROCHLORPERAZINE MALEATE 10 MG PO TABS
10.0000 mg | ORAL_TABLET | Freq: Four times a day (QID) | ORAL | Status: DC | PRN
Start: 2015-02-09 — End: 2015-05-24

## 2015-02-09 MED ORDER — ACETAMINOPHEN 325 MG PO TABS
650.0000 mg | ORAL_TABLET | Freq: Once | ORAL | Status: AC
  Administered 2015-02-09: 650 mg via ORAL

## 2015-02-09 MED ORDER — SODIUM CHLORIDE 0.9 % IV SOLN
Freq: Once | INTRAVENOUS | Status: AC
  Administered 2015-02-09: 10:00:00 via INTRAVENOUS

## 2015-02-09 MED ORDER — ONDANSETRON HCL 8 MG PO TABS
8.0000 mg | ORAL_TABLET | Freq: Two times a day (BID) | ORAL | Status: DC
Start: 2015-02-09 — End: 2015-05-24

## 2015-02-09 MED ORDER — ALLOPURINOL 300 MG PO TABS: 300.0000 mg | ORAL_TABLET | Freq: Every day | ORAL | Status: DC

## 2015-02-09 MED ORDER — HEPARIN SOD (PORK) LOCK FLUSH 100 UNIT/ML IV SOLN
500.0000 [IU] | Freq: Once | INTRAVENOUS | Status: AC | PRN
  Administered 2015-02-09: 500 [IU]
  Filled 2015-02-09: qty 5

## 2015-02-09 MED ORDER — SODIUM CHLORIDE 0.9 % IJ SOLN
10.0000 mL | INTRAMUSCULAR | Status: DC | PRN
  Administered 2015-02-09: 10 mL
  Filled 2015-02-09: qty 10

## 2015-02-09 MED ORDER — PREDNISONE 20 MG PO TABS: ORAL_TABLET | ORAL | Status: DC

## 2015-02-09 NOTE — Progress Notes (Signed)
I faxed [facility]  [phone] and gave patient the originals for his records. I also faxed [facility] [phone] and gave orig to patient--notes included

## 2015-02-09 NOTE — Progress Notes (Signed)
I gave the pateint the faxed copies(originals) to [facility] and [facility].

## 2015-02-09 NOTE — Telephone Encounter (Signed)
email to maggie and michele about 1/13 iron tx,all other appointments have been made and patient will get a new avs in chemo

## 2015-02-09 NOTE — Patient Instructions (Signed)
Doxorubicin injection  What is this medicine?  DOXORUBICIN (dox oh ROO bi sin) is a chemotherapy drug. It is used to treat many kinds of cancer like Hodgkin's disease, leukemia, non-Hodgkin's lymphoma, neuroblastoma, sarcoma, and Wilms' tumor. It is also used to treat bladder cancer, breast cancer, lung cancer, ovarian cancer, stomach cancer, and thyroid cancer.  This medicine may be used for other purposes; ask your health care provider or pharmacist if you have questions.  What should I tell my health care provider before I take this medicine?  They need to know if you have any of these conditions:  -blood disorders  -heart disease, recent heart attack  -infection (especially a virus infection such as chickenpox, cold sores, or herpes)  -irregular heartbeat  -liver disease  -recent or ongoing radiation therapy  -an unusual or allergic reaction to doxorubicin, other chemotherapy agents, other medicines, foods, dyes, or preservatives  -pregnant or trying to get pregnant  -breast-feeding  How should I use this medicine?  This drug is given as an infusion into a vein. It is administered in a hospital or clinic by a specially trained health care professional. If you have pain, swelling, burning or any unusual feeling around the site of your injection, tell your health care professional right away.  Talk to your pediatrician regarding the use of this medicine in children. Special care may be needed.  Overdosage: If you think you have taken too much of this medicine contact a poison control center or emergency room at once.  NOTE: This medicine is only for you. Do not share this medicine with others.  What if I miss a dose?  It is important not to miss your dose. Call your doctor or health care professional if you are unable to keep an appointment.  What may interact with this medicine?  Do not take this medicine with any of the following medications:  -cisapride  -droperidol  -halofantrine  -pimozide  -zidovudine  This  medicine may also interact with the following medications:  -chloroquine  -chlorpromazine  -clarithromycin  -cyclophosphamide  -cyclosporine  -erythromycin  -medicines for depression, anxiety, or psychotic disturbances  -medicines for irregular heart beat like amiodarone, bepridil, dofetilide, encainide, flecainide, propafenone, quinidine  -medicines for seizures like ethotoin, fosphenytoin, phenytoin  -medicines for nausea, vomiting like dolasetron, ondansetron, palonosetron  -medicines to increase blood counts like filgrastim, pegfilgrastim, sargramostim  -methadone  -methotrexate  -pentamidine  -progesterone  -vaccines  -verapamil  Talk to your doctor or health care professional before taking any of these medicines:  -acetaminophen  -aspirin  -ibuprofen  -ketoprofen  -naproxen  This list may not describe all possible interactions. Give your health care provider a list of all the medicines, herbs, non-prescription drugs, or dietary supplements you use. Also tell them if you smoke, drink alcohol, or use illegal drugs. Some items may interact with your medicine.  What should I watch for while using this medicine?  Your condition will be monitored carefully while you are receiving this medicine. You will need important blood work done while you are taking this medicine.  This drug may make you feel generally unwell. This is not uncommon, as chemotherapy can affect healthy cells as well as cancer cells. Report any side effects. Continue your course of treatment even though you feel ill unless your doctor tells you to stop.  Your urine may turn red for a few days after your dose. This is not blood. If your urine is dark or brown, call your doctor.    fight infections. Try to avoid being around people who are sick. This medicine may increase your risk to bruise or bleed. Call your doctor or health care professional if you notice any unusual bleeding. Be careful brushing and flossing your teeth or using a toothpick because you may get an infection or bleed more easily. If you have any dental work done, tell your dentist you are receiving this medicine. Avoid taking products that contain aspirin, acetaminophen, ibuprofen, naproxen, or ketoprofen unless instructed by your doctor. These medicines may hide a fever. Men and women of childbearing age should use effective birth control methods while using taking this medicine. Do not become pregnant while taking this medicine. There is a potential for serious side effects to an unborn child. Talk to your health care professional or pharmacist for more information. Do not breast-feed an infant while taking this medicine. Do not let others touch your urine or other body fluids for 5 days after each treatment with this medicine. Caregivers should wear latex gloves to avoid touching body fluids during this time. There is a maximum amount of this medicine you should receive throughout your life. The amount depends on the medical condition being treated and your overall health. Your doctor will watch how much of this medicine you receive in your lifetime. Tell your doctor if you have taken this medicine before. What side effects may I notice from receiving this medicine? Side effects that you should report to your doctor or health care professional as soon as possible: -allergic reactions like skin rash, itching or hives, swelling of the face, lips, or tongue -low blood counts - this medicine may decrease the number of white blood cells, red blood cells and platelets. You may be at increased risk for infections and bleeding. -signs of infection - fever or chills, cough,  sore throat, pain or difficulty passing urine -signs of decreased platelets or bleeding - bruising, pinpoint red spots on the skin, black, tarry stools, blood in the urine -signs of decreased red blood cells - unusually weak or tired, fainting spells, lightheadedness -breathing problems -chest pain -fast, irregular heartbeat -mouth sores -nausea, vomiting -pain, swelling, redness at site where injected -pain, tingling, numbness in the hands or feet -swelling of ankles, feet, or hands -unusual bleeding or bruising Side effects that usually do not require medical attention (report to your doctor or health care professional if they continue or are bothersome): -diarrhea -facial flushing -hair loss -loss of appetite -missed menstrual periods -nail discoloration or damage -red or watery eyes -red colored urine -stomach upset This list may not describe all possible side effects. Call your doctor for medical advice about side effects. You may report side effects to FDA at 1-800-FDA-1088. Where should I keep my medicine? This drug is given in a hospital or clinic and will not be stored at home. NOTE: This sheet is a summary. It may not cover all possible information. If you have questions about this medicine, talk to your doctor, pharmacist, or health care provider.    2016, [employer]. (2012-05-18 09:54:34) Vincristine injection What is this medicine? VINCRISTINE (vin KRIS teen) is a chemotherapy drug. It slows the growth of cancer cells. This medicine is used to treat many types of cancer like Hodgkin's disease, leukemia, non-Hodgkin's lymphoma, neuroblastoma (brain cancer), rhabdomyosarcoma, and Wilms' tumor. This medicine may be used for other purposes; ask your health care provider or pharmacist if you have questions. What should I tell my health care provider before I take this medicine?  They need to know if you have any of these conditions: -blood  disorders -gout -infection (especially chickenpox, cold sores, or herpes) -kidney disease -liver disease -lung disease -nervous system disease like Charcot-Marie-Tooth (CMT) -recent or ongoing radiation therapy -an unusual or allergic reaction to vincristine, other chemotherapy agents, other medicines, foods, dyes, or preservatives -pregnant or trying to get pregnant -breast-feeding How should I use this medicine? This drug is given as an infusion into a vein. It is administered in a hospital or clinic by a specially trained health care professional. If you have pain, swelling, burning, or any unusual feeling around the site of your injection, tell your health care professional right away. Talk to your pediatrician regarding the use of this medicine in children. While this drug may be prescribed for selected conditions, precautions do apply. Overdosage: If you think you have taken too much of this medicine contact a poison control center or emergency room at once. NOTE: This medicine is only for you. Do not share this medicine with others. What if I miss a dose? It is important not to miss your dose. Call your doctor or health care professional if you are unable to keep an appointment. What may interact with this medicine? Do not take this medicine with any of the following medications: -itraconazole -mibefradil -voriconazole This medicine may also interact with the following medications: -cyclosporine -erythromycin -fluconazole -ketoconazole -medicines for HIV like delavirdine, efavirenz, nevirapine -medicines for seizures like ethotoin, fosphenotoin, phenytoin -medicines to increase blood counts like filgrastim, pegfilgrastim, sargramostim -other chemotherapy drugs like cisplatin, L-asparaginase, methotrexate, mitomycin, paclitaxel -pegaspargase -vaccines -zalcitabine, ddC Talk to your doctor or health care professional before taking any of these  medicines: -acetaminophen -aspirin -ibuprofen -ketoprofen -naproxen This list may not describe all possible interactions. Give your health care provider a list of all the medicines, herbs, non-prescription drugs, or dietary supplements you use. Also tell them if you smoke, drink alcohol, or use illegal drugs. Some items may interact with your medicine. What should I watch for while using this medicine? Your condition will be monitored carefully while you are receiving this medicine. You will need important blood work done while you are taking this medicine. This drug may make you feel generally unwell. This is not uncommon, as chemotherapy can affect healthy cells as well as cancer cells. Report any side effects. Continue your course of treatment even though you feel ill unless your doctor tells you to stop. In some cases, you may be given additional medicines to help with side effects. Follow all directions for their use. Call your doctor or health care professional for advice if you get a fever, chills or sore throat, or other symptoms of a cold or flu. Do not treat yourself. Avoid taking products that contain aspirin, acetaminophen, ibuprofen, naproxen, or ketoprofen unless instructed by your doctor. These medicines may hide a fever. Do not become pregnant while taking this medicine. Women should inform their doctor if they wish to become pregnant or think they might be pregnant. There is a potential for serious side effects to an unborn child. Talk to your health care professional or pharmacist for more information. Do not breast-feed an infant while taking this medicine. Men may have a lower sperm count while taking this medicine. Talk to your doctor if you plan to father a child. What side effects may I notice from receiving this medicine? Side effects that you should report to your doctor or health care professional as soon as possible: -allergic reactions like skin  rash, itching or hives,  swelling of the face, lips, or tongue -breathing problems -confusion or changes in emotions or moods -constipation -cough -mouth sores -muscle weakness -nausea and vomiting -pain, swelling, redness or irritation at the injection site -pain, tingling, numbness in the hands or feet -problems with balance, talking, walking -seizures -stomach pain -trouble passing urine or change in the amount of urine Side effects that usually do not require medical attention (report to your doctor or health care professional if they continue or are bothersome): -diarrhea -hair loss -jaw pain -loss of appetite This list may not describe all possible side effects. Call your doctor for medical advice about side effects. You may report side effects to FDA at 1-800-FDA-1088. Where should I keep my medicine? This drug is given in a hospital or clinic and will not be stored at home. NOTE: This sheet is a summary. It may not cover all possible information. If you have questions about this medicine, talk to your doctor, pharmacist, or health care provider.    2016, [employer]. (2007-10-18 17:17:13) Cyclophosphamide injection What is this medicine? CYCLOPHOSPHAMIDE (sye kloe FOSS fa mide) is a chemotherapy drug. It slows the growth of cancer cells. This medicine is used to treat many types of cancer like lymphoma, myeloma, leukemia, breast cancer, and ovarian cancer, to name a few. This medicine may be used for other purposes; ask your health care provider or pharmacist if you have questions. What should I tell my health care provider before I take this medicine? They need to know if you have any of these conditions: -blood disorders -history of other chemotherapy -infection -kidney disease -liver disease -recent or ongoing radiation therapy -tumors in the bone marrow -an unusual or allergic reaction to cyclophosphamide, other chemotherapy, other medicines, foods, dyes, or  preservatives -pregnant or trying to get pregnant -breast-feeding How should I use this medicine? This drug is usually given as an injection into a vein or muscle or by infusion into a vein. It is administered in a hospital or clinic by a specially trained health care professional. Talk to your pediatrician regarding the use of this medicine in children. Special care may be needed. Overdosage: If you think you have taken too much of this medicine contact a poison control center or emergency room at once. NOTE: This medicine is only for you. Do not share this medicine with others. What if I miss a dose? It is important not to miss your dose. Call your doctor or health care professional if you are unable to keep an appointment. What may interact with this medicine? This medicine may interact with the following medications: -amiodarone -amphotericin B -azathioprine -certain antiviral medicines for HIV or AIDS such as protease inhibitors (e.g., indinavir, ritonavir) and zidovudine -certain blood pressure medications such as benazepril, captopril, enalapril, fosinopril, lisinopril, moexipril, monopril, perindopril, quinapril, ramipril, trandolapril -certain cancer medications such as anthracyclines (e.g., daunorubicin, doxorubicin), busulfan, cytarabine, paclitaxel, pentostatin, tamoxifen, trastuzumab -certain diuretics such as chlorothiazide, chlorthalidone, hydrochlorothiazide, indapamide, metolazone -certain medicines that treat or prevent blood clots like warfarin -certain muscle relaxants such as succinylcholine -cyclosporine -etanercept -indomethacin -medicines to increase blood counts like filgrastim, pegfilgrastim, sargramostim -medicines used as general anesthesia -metronidazole -natalizumab This list may not describe all possible interactions. Give your health care provider a list of all the medicines, herbs, non-prescription drugs, or dietary supplements you use. Also tell them if  you smoke, drink alcohol, or use illegal drugs. Some items may interact with your medicine. What should I watch for while  using this medicine? Visit your doctor for checks on your progress. This drug may make you feel generally unwell. This is not uncommon, as chemotherapy can affect healthy cells as well as cancer cells. Report any side effects. Continue your course of treatment even though you feel ill unless your doctor tells you to stop. Drink water or other fluids as directed. Urinate often, even at night. In some cases, you may be given additional medicines to help with side effects. Follow all directions for their use. Call your doctor or health care professional for advice if you get a fever, chills or sore throat, or other symptoms of a cold or flu. Do not treat yourself. This drug decreases your body's ability to fight infections. Try to avoid being around people who are sick. This medicine may increase your risk to bruise or bleed. Call your doctor or health care professional if you notice any unusual bleeding. Be careful brushing and flossing your teeth or using a toothpick because you may get an infection or bleed more easily. If you have any dental work done, tell your dentist you are receiving this medicine. You may get drowsy or dizzy. Do not drive, use machinery, or do anything that needs mental alertness until you know how this medicine affects you. Do not become pregnant while taking this medicine or for 1 year after stopping it. Women should inform their doctor if they wish to become pregnant or think they might be pregnant. Men should not father a child while taking this medicine and for 4 months after stopping it. There is a potential for serious side effects to an unborn child. Talk to your health care professional or pharmacist for more information. Do not breast-feed an infant while taking this medicine. This medicine may interfere with the ability to have a child. This medicine  has caused ovarian failure in some women. This medicine has caused reduced sperm counts in some men. You should talk with your doctor or health care professional if you are concerned about your fertility. If you are going to have surgery, tell your doctor or health care professional that you have taken this medicine. What side effects may I notice from receiving this medicine? Side effects that you should report to your doctor or health care professional as soon as possible: -allergic reactions like skin rash, itching or hives, swelling of the face, lips, or tongue -low blood counts - this medicine may decrease the number of white blood cells, red blood cells and platelets. You may be at increased risk for infections and bleeding. -signs of infection - fever or chills, cough, sore throat, pain or difficulty passing urine -signs of decreased platelets or bleeding - bruising, pinpoint red spots on the skin, black, tarry stools, blood in the urine -signs of decreased red blood cells - unusually weak or tired, fainting spells, lightheadedness -breathing problems -dark urine -dizziness -palpitations -swelling of the ankles, feet, hands -trouble passing urine or change in the amount of urine -weight gain -yellowing of the eyes or skin Side effects that usually do not require medical attention (report to your doctor or health care professional if they continue or are bothersome): -changes in nail or skin color -hair loss -missed menstrual periods -mouth sores -nausea, vomiting This list may not describe all possible side effects. Call your doctor for medical advice about side effects. You may report side effects to FDA at 1-800-FDA-1088. Where should I keep my medicine? This drug is given in a hospital or clinic  and will not be stored at home. NOTE: This sheet is a summary. It may not cover all possible information. If you have questions about this medicine, talk to your doctor, pharmacist, or  health care provider.    2016, [employer]. (2011-12-05 16:22:58) Rituximab injection What is this medicine? RITUXIMAB (ri TUX i mab) is a monoclonal antibody. It is used commonly to treat non-Hodgkin lymphoma and other conditions. It is also used to treat rheumatoid arthritis (RA). In RA, this medicine slows the inflammatory process and help reduce joint pain and swelling. This medicine is often used with other cancer or arthritis medications. This medicine may be used for other purposes; ask your health care provider or pharmacist if you have questions. What should I tell my health care provider before I take this medicine? They need to know if you have any of these conditions: -blood disorders -heart disease -history of hepatitis B -infection (especially a virus infection such as chickenpox, cold sores, or herpes) -irregular heartbeat -kidney disease -lung or breathing disease, like asthma -lupus -an unusual or allergic reaction to rituximab, mouse proteins, other medicines, foods, dyes, or preservatives -pregnant or trying to get pregnant -breast-feeding How should I use this medicine? This medicine is for infusion into a vein. It is administered in a hospital or clinic by a specially trained health care professional. A special MedGuide will be given to you by the pharmacist with each prescription and refill. Be sure to read this information carefully each time. Talk to your pediatrician regarding the use of this medicine in children. This medicine is not approved for use in children. Overdosage: If you think you have taken too much of this medicine contact a poison control center or emergency room at once. NOTE: This medicine is only for you. Do not share this medicine with others. What if I miss a dose? It is important not to miss a dose. Call your doctor or health care professional if you are unable to keep an appointment. What may interact with this  medicine? -cisplatin -medicines for blood pressure -some other medicines for arthritis -vaccines This list may not describe all possible interactions. Give your health care provider a list of all the medicines, herbs, non-prescription drugs, or dietary supplements you use. Also tell them if you smoke, drink alcohol, or use illegal drugs. Some items may interact with your medicine. What should I watch for while using this medicine? Report any side effects that you notice during your treatment right away, such as changes in your breathing, fever, chills, dizziness or lightheadedness. These effects are more common with the first dose. Visit your prescriber or health care professional for checks on your progress. You will need to have regular blood work. Report any other side effects. The side effects of this medicine can continue after you finish your treatment. Continue your course of treatment even though you feel ill unless your doctor tells you to stop. Call your doctor or health care professional for advice if you get a fever, chills or sore throat, or other symptoms of a cold or flu. Do not treat yourself. This drug decreases your body's ability to fight infections. Try to avoid being around people who are sick. This medicine may increase your risk to bruise or bleed. Call your doctor or health care professional if you notice any unusual bleeding. Be careful brushing and flossing your teeth or using a toothpick because you may get an infection or bleed more easily. If you have any dental work  done, tell your dentist you are receiving this medicine. Avoid taking products that contain aspirin, acetaminophen, ibuprofen, naproxen, or ketoprofen unless instructed by your doctor. These medicines may hide a fever. Do not become pregnant while taking this medicine. Women should inform their doctor if they wish to become pregnant or think they might be pregnant. There is a potential for serious side effects  to an unborn child. Talk to your health care professional or pharmacist for more information. Do not breast-feed an infant while taking this medicine. What side effects may I notice from receiving this medicine? Side effects that you should report to your doctor or health care professional as soon as possible: -allergic reactions like skin rash, itching or hives, swelling of the face, lips, or tongue -low blood counts - this medicine may decrease the number of white blood cells, red blood cells and platelets. You may be at increased risk for infections and bleeding. -signs of infection - fever or chills, cough, sore throat, pain or difficulty passing urine -signs of decreased platelets or bleeding - bruising, pinpoint red spots on the skin, black, tarry stools, blood in the urine -signs of decreased red blood cells - unusually weak or tired, fainting spells, lightheadedness -breathing problems -confused, not responsive -chest pain -fast, irregular heartbeat -feeling faint or lightheaded, falls -mouth sores -redness, blistering, peeling or loosening of the skin, including inside the mouth -stomach pain -swelling of the ankles, feet, or hands -trouble passing urine or change in the amount of urine Side effects that usually do not require medical attention (report to your doctor or other health care professional if they continue or are bothersome): -anxiety -headache -loss of appetite -muscle aches -nausea -night sweats This list may not describe all possible side effects. Call your doctor for medical advice about side effects. You may report side effects to FDA at 1-800-FDA-1088. Where should I keep my medicine? This drug is given in a hospital or clinic and will not be stored at home. NOTE: This sheet is a summary. It may not cover all possible information. If you have questions about this medicine, talk to your doctor, pharmacist, or health care provider.    2016, [employer]. (2014-03-29 22:30:56) [facility] Discharge Instructions for Patients Receiving Chemotherapy  Today you received the following chemotherapy agents:  Adriamycin, Vincristine, Cytoxan, & Rituxan   To help prevent nausea and vomiting after your treatment, we encourage you to take your nausea medication. If you develop nausea and vomiting that is not controlled by your nausea medication, call the clinic.   BELOW ARE SYMPTOMS THAT SHOULD BE REPORTED IMMEDIATELY:  *FEVER GREATER THAN 100.5 F  *CHILLS WITH OR WITHOUT FEVER  NAUSEA AND VOMITING THAT IS NOT CONTROLLED WITH YOUR NAUSEA MEDICATION  *UNUSUAL SHORTNESS OF BREATH  *UNUSUAL BRUISING OR BLEEDING  TENDERNESS IN MOUTH AND THROAT WITH OR WITHOUT PRESENCE OF ULCERS  *URINARY PROBLEMS  *BOWEL PROBLEMS  UNUSUAL RASH Items with * indicate a potential emergency and should be followed up as soon as possible.  Feel free to call the clinic you have any questions or concerns. The clinic phone number is [phone].  Please show the [place] at check-in to the Emergency Department and triage nurse.

## 2015-02-09 NOTE — Progress Notes (Signed)
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 He "blew this off" and it did not happen again until 01/17/2015 when he felt weak at work and later fainted at home. He was brought to the ED where he was found to be guaiac positive (denies melena before that date) with a Hb of 4.5 MCV 82.2, platelets 295K, WBC 9.1 and normal INR and PTT. He was admitted and underwent EGD under Dr Michail Sermon 01/18/2015 showing a very large duodenal ulcer w clot. Abdominal US 01/19/2015 was not very informative but CT abd/pelvis 12/17,2016 showed adenopathy in the anterior mediastinum, retroperitoneum and pelvis, with a large ulcerated mass involving the distal stomach/proximal duodenum and extending into the gastrohepatic region.   On 01/22/2015 he underwent right inguinal lymph node biopsy, consistent with mantle cell lymphoma (([id number] and [id number])). Specifically the lymph node showed effacement of the architecture by sheets of small to medium sized lymphocytes with no apparent nodularity. The cells were positive for CD20, CD5, BCL-2, CD21, and cyclin D1. He 63 was low (10-20%). CD21 revealed a few residual germinal centers area at the B cells were negative for CD10, and BCL-6. CD23 was negative.  On 02/06/2015 the patient underwent a bone marrow aspirate and  biopsy. The pathology from this procedure (FZB 17-2 and 17-7) was positive for involvement by non-Hodgkin's lymphoma with numerous atypical interstitial and paratrabecular lymphoid aggregates consistent with mantle cell lymphoma. Iron stain showed scant iron flow cytometry confirmed a monoclonal B-cell population, lambda restricted, consistent with the patient's known diagnosis of mantle cell lymphoma.  On 01/18/2015 the patient underwent EGD which found a massive duodenal bulb ulcer with adherent clot, although no active bleeding.his hemoglobin was in the 8.4 range and stable. He was discharged on 01/22/2015, but within 24 hours was readmitted with syncope and altered mental status. He had aspirated and required intubation for his respiratory failure. Hemoglobin was down to 6.3. An NG tube was placed, the patient was multiply transfused, and Surgery was considered but felt not to be feasible.accordingly the patient underwent arteriography 01/23/2015 which found the superior mesenteric and celiac arteries not to be the involved vessels. The gastroduodenal artery showed multiple branches providing supply to the hypervascular duodenal mass, and this artery was coil embolized.. Also a port was placed at the time of this procedure with a view to eventual chemotherapy.  He was discharged 01/26/2015,but required readmission 02/07/2015 for another presyncopal episode. He presented to the emergency room where he was found to have a hemoglobin of 7.5. The patient was transfusedIIA hemoglobin of 10 and was discharged 02/07/2014. He was scheduled to start CHOP right toxin 02/08/2014.  His subsequent history is as detailed below.  INTERVAL HISTORY: Will was evaluated in the hematology clinic today accompanied by his wife [name]. He was discharged yesterday after yet another admission for presyncope. He is ready to proceed to chemotherapy and today is day 1 cycle 1 of  6-8 planned cycles of CHOP/rituximab  REVIEW OF  SYSTEMS: Will has been on a clear liquid diet since his coil embolization procedure, but this was changed to regular died yesterday and he is enjoying that. He is had no nausea or vomiting, no abdominal pain, and his bowel movements have remained brown and well formed. He is chafing because he wants to get back to exercising. He worries about when he will be able to get back to work and there is also a mortgage issue. They have given [place] papers to document his medical condition and we should have those ready for him today. At present he denies dizziness, feeling weak or having any trouble with balance. There has been no cough, phlegm production, or pleurisy. There has been no rash or fever. A detailed review of systems today was otherwise stable  PAST MEDICAL HISTORY: Past Medical History  Diagnosis Date  . Essential hypertension   . HLD (hyperlipidemia)   . GERD (gastroesophageal reflux disease)   . Anemia   . Cancer ([place])     Lymphoma  . GI bleed   . Weakness     PAST SURGICAL HISTORY: Past Surgical History  Procedure Laterality Date  . Skin surgery      Small benign cysts over left scalp removed  . Esophagogastroduodenoscopy Left 01/18/2015    Procedure: ESOPHAGOGASTRODUODENOSCOPY (EGD);  Surgeon: Wilford Corner, MD;  [facility] END[facility]COPY;  Service: Endoscopy;  Laterality: Left;  . Inguinal hernia repair Right 01/22/2015    Procedure: RIGHT INGUINAL LYMPH NODE BX;  Surgeon: Donnie Mesa, MD;  Location: [facility];  Service: General;  Laterality: Right;  . Portacath placement  01/26/2015     power port with tip SVC/RA Junction    FAMILY HISTORY Family History  Problem Relation Age of Onset  . Hypertension Mother   . Hypertension Father   . Hypertension Sister   . Diabetes Sister   . Prostate cancer Brother   . Lupus Sister   . Kidney failure Father   The patient's father died from CHF complications age 20. The patient's mother is 58 y/o as of December 2016. The patient has 9  brothers, 8 sisters. One brother has prostate cancer. There is no other cancer history in the family to his knowledge.  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 His wife of 43 years, [name], is disabled due to RA. Daughtr [name] works as a Teacher, early years/pre for [employer] (she previously worked at the [employer] in records). Son [name] owns a window washing business. Son [name] is in Press photographer at [employer]. All live in [place]. The patient has 3 grandchildren. He attends a local [employer] DIRECTIVES: Not in place   HEALTH MAINTENANCE: Social History  Substance Use Topics  . Smoking status: Never Smoker   . Smokeless tobacco: Never Used  . Alcohol Use: Yes     Comment: socially     Colonoscopy:  PSA: 2.27 on 01/22/2015  Hepatitis serologies (be surface antigen, hepatitis B and hepatitis see IgM) negative 01/18/2015  No Known Allergies  Current Outpatient Prescriptions  Medication Sig Dispense Refill  . allopurinol (ZYLOPRIM) 300 MG tablet Take 1 tablet (300 mg total) by mouth daily. 30 tablet 3  . lisinopril-hydrochlorothiazide (PRINZIDE,ZESTORETIC) 20-25 MG tablet Take 1 tablet by mouth daily.    Marland Kitchen LORazepam (ATIVAN) 0.5 MG tablet Take 1 tablet (0.5 mg total) by mouth every 6 (six) hours  as needed (Nausea or vomiting). 30 tablet 0  . lovastatin (MEVACOR) 20 MG tablet Take 20 mg by mouth at bedtime.    Marland Kitchen omeprazole (PRILOSEC) 40 MG capsule Take 1 capsule (40 mg total) by mouth 2 (two) times daily. 60 capsule 0  . ondansetron (ZOFRAN) 8 MG tablet Take 1 tablet (8 mg total) by mouth 2 (two) times daily. Start the day after chemo for 3 days. Then as needed for nausea or vomiting. 30 tablet 1  . predniSONE (DELTASONE) 20 MG tablet Take 3 tablets a day ( in am ) with food x 5 days starting on day 1 of treatment plan 15 tablet 6  . prochlorperazine (COMPAZINE) 10 MG tablet Take 1 tablet (10 mg total) by mouth every 6 (six)  hours as needed (Nausea or vomiting). 30 tablet 6  . sucralfate (CARAFATE) 1 g tablet Take 1 tablet (1 g total) by mouth 4 (four) times daily -  with meals and at bedtime. 90 tablet 0   No current facility-administered medications for this visit.    OBJECTIVE: Middle-aged African-American man who appears well Filed Vitals:   02/09/15 0828  BP: 115/74  Pulse: 83  Temp: 98.5 F (36.9 C)  Resp: 18     Body mass index is 27.62 kg/(m^2).    ECOG FS:1 - Symptomatic but completely ambulatory  Ocular: Sclerae unicteric, pupils equal, round and reactive to light Ear-nose-throat: Oropharynx clear and moist Lymphatic: No cervical or supraclavicular adenopathy, no axillary or inguinal adenopathy Lungs no rales or rhonchi, good excursion bilaterally Heart regular rate and rhythm, no murmur appreciated Abd soft, nontender, positive bowel sounds MSK no focal spinal tenderness, no joint edema Neuro: non-focal, well-oriented, appropriate affect   LAB RESULTS:  CMP     Component Value Date/Time   NA 138 02/08/2015 0705   K 4.0 02/08/2015 0705   CL 102 02/08/2015 0705   CO2 27 02/08/2015 0705   GLUCOSE 115* 02/08/2015 0705   BUN 22* 02/08/2015 0705   CREATININE 1.11 02/08/2015 0705   CALCIUM 8.9 02/08/2015 0705   PROT 5.4* 01/27/2015 0544   ALBUMIN 2.5* 01/27/2015 0544   AST 24 01/27/2015 0544   ALT 23 01/27/2015 0544   ALKPHOS 55 01/27/2015 0544   BILITOT 0.6 01/27/2015 0544   GFRNONAA >60 02/08/2015 0705   GFRAA >60 02/08/2015 0705    INo results found for: SPEP, UPEP  Lab Results  Component Value Date   WBC 7.1 02/08/2015   NEUTROABS 7.0 02/07/2015   HGB 10.0* 02/08/2015   HCT 30.3* 02/08/2015   MCV 86.5 02/08/2015   PLT 505* 02/08/2015      Chemistry      Component Value Date/Time   NA 138 02/08/2015 0705   K 4.0 02/08/2015 0705   CL 102 02/08/2015 0705   CO2 27 02/08/2015 0705   BUN 22* 02/08/2015 0705   CREATININE 1.11 02/08/2015 0705      Component Value  Date/Time   CALCIUM 8.9 02/08/2015 0705   ALKPHOS 55 01/27/2015 0544   AST 24 01/27/2015 0544   ALT 23 01/27/2015 0544   BILITOT 0.6 01/27/2015 0544       No results found for: LABCA2  No components found for: LABCA125   Recent Labs Lab 02/06/15 0720  INR 1.03    Urinalysis    Component Value Date/Time   COLORURINE YELLOW 01/23/2015 0805   APPEARANCEUR CLEAR 01/23/2015 0805   LABSPEC 1.014 01/23/2015 0805   PHURINE 7.5 01/23/2015 0805  GLUCOSEU NEGATIVE 01/23/2015 0805   HGBUR NEGATIVE 01/23/2015 0805   BILIRUBINUR NEGATIVE 01/23/2015 0805   KETONESUR NEGATIVE 01/23/2015 0805   PROTEINUR NEGATIVE 01/23/2015 0805   NITRITE NEGATIVE 01/23/2015 0805   LEUKOCYTESUR NEGATIVE 01/23/2015 0805    STUDIES: Ct Head Wo Contrast  01/23/2015  CLINICAL DATA:  65 year old male with fall. EXAM: CT HEAD WITHOUT CONTRAST CT CERVICAL SPINE WITHOUT CONTRAST TECHNIQUE: Multidetector CT imaging of the head and cervical spine was performed following the standard protocol without intravenous contrast. Multiplanar CT image reconstructions of the cervical spine were also generated. COMPARISON:  None. FINDINGS: CT HEAD FINDINGS The ventricles and sulci are appropriate in size for patient's age. Mild periventricular and deep white matter hypodensities represent chronic microvascular ischemic changes. There is no intracranial hemorrhage. No mass effect or midline shift identified. An endotracheal and enteric tube are partially visualized. There is diffuse mucoperiosteal thickening of the paranasal sinuses with partial opacification of the ethmoid air cells. The mastoid air cells are well aerated. Right occipital scalp hematoma. The calvarium is intact. CT CERVICAL SPINE FINDINGS There is no acute fracture or subluxation of the cervical spine.Mild multilevel degenerative changes.The odontoid and spinous processes are intact.There is normal anatomic alignment of the C1-C2 lateral masses. The visualized  soft tissues appear unremarkable. An enteric tube and an endotracheal tube are partially visualized. Partially visualized bilateral pleural effusions, right greater than left. There is bilateral supraclavicular and right cervical adenopathy. Upper mediastinal lymphadenopathy noted. Chest CT is recommended for further evaluation. Right supraclavicular soft tissue hemorrhage noted. IMPRESSION: No acute intracranial hemorrhage. 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 No acute/traumatic cervical spine pathology. Bilateral supraclavicular and right cervical as well as mediastinal adenopathy. Further evaluation recommended. Electronically Signed   By: Anner Crete M.D.   On: 01/23/2015 18:27   Ct Chest W Contrast  01/21/2015  CLINICAL DATA:  Mediastinal mass. EXAM: CT CHEST WITH CONTRAST TECHNIQUE: Multidetector CT imaging of the chest was performed during intravenous contrast administration. CONTRAST:  60m OMNIPAQUE IOHEXOL 300 MG/ML  SOLN COMPARISON:  CT abdomen and pelvis dated 01/20/2015. FINDINGS: On yesterday's CT of the abdomen and pelvis, a large ulcerated mass involving the distal stomach and proximal duodenum was identified, with extension to the porta hepatis and gastrohepatic region, suggesting lymphoma. Associated lymphadenopathy was identified in the lower anterior mediastinum, retroperitoneum and pelvis, further suggesting lymphoma. Additional lymphadenopathy was suspected in the sub-carinal region of the mediastinum, incompletely imaged. On today's study, conglomerate subcarinal lymphadenopathy is confirmed, measuring 7.5 x 2.8 cm on image 33 of series 201. This conglomerate lymphadenopathy extends upwards within the mediastinum encasing the mid and lower portions of the trachea, without effacement of the trachea. At the level of the lower trachea, the conglomerate lymphadenopathy measures approximately 5.9 x 3.9 cm. Additional lymphadenopathy within the anterior  mediastinum measures 4.4 x 2.3 cm (image 17, series 201). Additional smaller lymph nodes scattered throughout the mediastinum and bilateral perihilar regions. Numerous small and moderately enlarged lymph nodes are seen within each axillary region. Clustered small and enlarged lymph nodes are seen within the supraclavicular regions bilaterally. There is a right pleural effusion, small to moderate in size, with adjacent compressive atelectasis. There is also a small left pleural effusion with adjacent atelectasis. Very mild degenerative change noted within the cervical and thoracic spine. No acute osseous abnormality identified. Thoracic aorta is normal in caliber and configuration. Heart size is normal. No pericardial effusion. No central obstructing pulmonary embolism seen. Upper abdominal findings described on yesterday's CT.  IMPRESSION: 1. Extensive mediastinal lymphadenopathy, bilateral axillary lymphadenopathy and supraclavicular lymphadenopathy. 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 2. Right pleural effusion, small to moderate in size, with adjacent atelectasis. Additional smaller left pleural effusion. Electronically Signed   By: Franki Cabot M.D.   On: 01/21/2015 16:47   Ct Cervical Spine Wo Contrast  01/23/2015  CLINICAL DATA:  65 year old male with fall. EXAM: CT HEAD WITHOUT CONTRAST CT CERVICAL SPINE WITHOUT CONTRAST TECHNIQUE: Multidetector CT imaging of the head and cervical spine was performed following the standard protocol without intravenous contrast. Multiplanar CT image reconstructions of the cervical spine were also generated. COMPARISON:  None. FINDINGS: CT HEAD FINDINGS The ventricles and sulci are appropriate in size for patient's age. Mild periventricular and deep white matter hypodensities represent chronic microvascular ischemic changes. There is no intracranial hemorrhage. No mass effect or  midline shift identified. An endotracheal and enteric tube are partially visualized. There is diffuse mucoperiosteal thickening of the paranasal sinuses with partial opacification of the ethmoid air cells. The mastoid air cells are well aerated. Right occipital scalp hematoma. The calvarium is intact. CT CERVICAL SPINE FINDINGS There is no acute fracture or subluxation of the cervical spine.Mild multilevel degenerative changes.The odontoid and spinous processes are intact.There is normal anatomic alignment of the C1-C2 lateral masses. The visualized soft tissues appear unremarkable. An enteric tube and an endotracheal tube are partially visualized. Partially visualized bilateral pleural effusions, right greater than left. There is bilateral supraclavicular and right cervical adenopathy. Upper mediastinal lymphadenopathy noted. Chest CT is recommended for further evaluation. Right supraclavicular soft tissue hemorrhage noted. IMPRESSION: No acute intracranial hemorrhage. 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 No acute/traumatic cervical spine pathology. Bilateral supraclavicular and right cervical as well as mediastinal adenopathy. Further evaluation recommended. Electronically Signed   By: Anner Crete M.D.   On: 01/23/2015 18:27   Ct Abdomen Pelvis W Contrast  01/20/2015  CLINICAL DATA:  GI bleed with syncopal episodes. Massive duodenal bulb ulcer by EGD. Abnormal abdominal ultrasound demonstrating heterogeneous soft tissue prominence in the region of the porta hepatis pancreatic head. EXAM: CT ABDOMEN AND PELVIS WITH CONTRAST TECHNIQUE: Multidetector CT imaging of the abdomen and pelvis was performed using the standard protocol following bolus administration of intravenous contrast. CONTRAST:  161m OMNIPAQUE IOHEXOL 300 MG/ML  SOLN COMPARISON:  Right upper quadrant abdominal ultrasound on 01/19/2015. FINDINGS: In the lower chest, soft tissue mass in the anterior lower  mediastinum abuts the pericardium and measures roughly 4.0 x 7.7 cm in axial dimensions. This is most likely consistent with a lymph node mass. The superior images also show potentially the bottom margin of subcarinal lymphadenopathy. There are small bilateral pleural effusions, right greater than left. In abdomen, a large confluent mass is identified involving the distal stomach and proximal duodenum with visible ulceration. At the level of the proximal duodenum, thickening of the duodenal wall approaches 3.5 cm. Soft tissue mass extends into the porta hepatis and gastrohepatic region and likely tracks along portal triads into the liver. Soft tissue mass also abuts and obscures definition of the pancreatic head and proximal pancreatic body. Mass also surrounds the portal vein, portal confluence and abuts the superior mesenteric vein. There is associated retroperitoneal lymphadenopathy in the lower abdomen and pelvis. Extensive bilateral iliac chain lymphadenopathy present. Elongated external iliac lymph node masses bilaterally measure roughly 1.8 cm in short axis. Multiple mildly enlarged bilateral inguinal lymph nodes also present. Conglomeration of  findings is most likely consistent with lymphoma with predominately gastric and duodenal involvement. No evidence of bowel obstruction or free intraperitoneal air. No focal abscess is identified. The gallbladder, distal pancreas, spleen, adrenal glands and kidneys appear unremarkable. No bony lesions are seen. The bladder is decompressed. No hernias are identified. IMPRESSION: Large ulcerated mass involving the distal stomach and proximal duodenum and extending into the porta hepatis and gastrohepatic region. Associated lymphadenopathy in the lower anterior mediastinum, retroperitoneum and pelvis. Also suspected subcarinal lymphadenopathy is partially visualized. 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 Electronically Signed   By: Aletta Edouard M.D.   On: 01/20/2015 13:50   Ir Angiogram Visceral Selective  01/23/2015  CLINICAL DATA:  65 year old male presents with acute life-threatening upper GI bleed secondary to an ulcerated mass encompassing the distal stomach and duodenum. 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 Endoscopic treatment options are limited and surgery is extremely high risk. Patient presents for emergent visceral arteriography and embolization. Additionally, central venous access is required. A triple-lumen central venous catheter will be placed during the course of the procedure. EXAM: SELECTIVE VISCERAL ARTERIOGRAPHY; IR EMBO ART VEN HEMORR LYMPH EXTRAV INC GUIDE ROADMAPPING; IR LEFT FLOURO GUIDE CV LINE; ADDITIONAL ARTERIOGRAPHY; IR ULTRASOUND GUIDANCE VASC ACCESS LEFT; ARTERIOGRAPHY Date: 01/23/2015 PROCEDURE: 1. Ultrasound-guided puncture left common femoral artery 2. Catheterization of the superior mesenteric artery with arteriogram 3. Catheterization of the celiac artery with arteriogram 4. Catheterization of the gastroduodenal artery with arteriogram 5. Catheterization of the right gastroepiploic artery with arteriogram 6. Coil embolization of the gastroduodenal artery 7. Post embolization arteriogram 8. Limited left common femoral arteriogram 9. Ultrasound-guided puncture of the left common femoral vein 10. Placement of a triple-lumen central venous catheter via the left femoral vein with fluoroscopic guidance Interventional Radiologist:  Criselda Peaches, MD ANESTHESIA/SEDATION: Moderate (conscious) sedation was used. 1 mg Versed, 50 mcg Fentanyl were administered intravenously. The patient's vital signs were monitored continuously by radiology nursing throughout the procedure. Sedation Time: 60 minutes MEDICATIONS: None additional FLUOROSCOPY TIME:  16 minutes for a total of 622 mGy CONTRAST:  136m OMNIPAQUE IOHEXOL 300 MG/ML   SOLN TECHNIQUE: Informed consent was obtained from the patient following explanation of the procedure, risks, benefits and alternatives. The patient understands, agrees and consents for the procedure. All questions were addressed. A time out was performed. Maximal barrier sterile technique utilized including caps, mask, sterile gowns, sterile gloves, large sterile drape, hand hygiene, and Betadine skin prep. The left groin was interrogated with ultrasound. The common femoral artery is found to be widely patent. An image was obtained and stored for the medical record. Local anesthesia was attained by infiltration with 1% lidocaine. Under real-time sonographic guidance, the vessel was punctured with a 21 gauge micropuncture needle. Using standard technique, the initial micro wire was exchanged through a transitional 5 [place] sheath for a working 0.035 inch Bentson wire. The micro sheath was then exchanged for a working 5 [place] sheath. A C2 cobra catheter was advanced into the abdominal aorta over the wire. The catheter was initially advanced into the superior mesenteric artery and superior mesenteric arteriogram was performed. There is conventional anatomy. No replaced or accessory right hepatic artery. No evidence of active extravasation or significant collateralization through the pancreaticoduodenal arcade. The C2 cobra catheter was next advanced into the celiac artery. An arteriogram was performed. The gastroduodenal artery is elongated and slightly irregular in the mid segment likely secondary  to external mass effect. There is no evidence of active extravasation. A renegade ST micro catheter was advanced coaxially over a fat and 16 wire. The micro catheter was advanced into the gastroduodenal artery. Arteriography was again performed. There are multiple branches arising from the gastroduodenal artery providing supply to a hypervascular mass in the region the descending duodenum. The micro  catheter was carefully advanced more distally into the proximal right gastroepiploic artery. Contrast injection was performed confirming the location of the catheter tip as well as confirming no additional supplied to the region of tumor. Coil embolization was then performed of the gastroduodenal artery using a combination of vortex, standard helical and soft interlock detachable micro coils ranging in size from 2-6 mm. Post embolization arteriography confirms successful embolization of the vessel with no further visible hypervascular blush of the duodenum tumor. The micro catheter was removed. Repeat celiac artery injection was performed confirming no additional collateral supply. The 5 French catheter was readvanced of the superior mesenteric artery and an additional angiogram was performed again confirming no collateral flow to the embolized territory. The catheters were removed. The vascular sheath was left in place for hemodynamic monitoring. Ultrasound was again used to interrogate the left groin. The left common femoral vein was identified. Local anesthesia was attained by infiltration with 1% lidocaine. The left common femoral vein was punctured with an 18 gauge needle. A wire was advanced into the inferior vena cava. The skin tract was dilated and an arrow triple-lumen central venous catheter advanced over the wire and position with the tip at the confluence of the IVC and left common iliac vein. The catheter was flushed, capped and secured with 0 Prolene suture. Sterile bandages were applied. COMPLICATIONS: None IMPRESSION: 1. No evidence of active extravasation. 2. 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 3. Coil embolization of the gastroduodenal artery. 4. Placement of a left femoral triple-lumen central venous catheter. The catheter tip is at the confluence of the IVC and iliac vein and ready for immediate use.  Signed, Criselda Peaches, MD [facility] Rad[facility] Electronically Signed   By: Jacqulynn Cadet M.D.   On: 01/23/2015 15:50   Ir Angiogram Visceral Selective  01/23/2015  CLINICAL DATA:  65 year old male presents with acute life-threatening upper GI bleed secondary to an ulcerated mass encompassing the distal stomach and duodenum. 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 Endoscopic treatment options are limited and surgery is extremely high risk. Patient presents for emergent visceral arteriography and embolization. Additionally, central venous access is required. A triple-lumen central venous catheter will be placed during the course of the procedure. EXAM: SELECTIVE VISCERAL ARTERIOGRAPHY; IR EMBO ART VEN HEMORR LYMPH EXTRAV INC GUIDE ROADMAPPING; IR LEFT FLOURO GUIDE CV LINE; ADDITIONAL ARTERIOGRAPHY; IR ULTRASOUND GUIDANCE VASC ACCESS LEFT; ARTERIOGRAPHY Date: 01/23/2015 PROCEDURE: 1. Ultrasound-guided puncture left common femoral artery 2. Catheterization of the superior mesenteric artery with arteriogram 3. Catheterization of the celiac artery with arteriogram 4. Catheterization of the gastroduodenal artery with arteriogram 5. Catheterization of the right gastroepiploic artery with arteriogram 6. Coil embolization of the gastroduodenal artery 7. Post embolization arteriogram 8. Limited left common femoral arteriogram 9. Ultrasound-guided puncture of the left common femoral vein 10. Placement of a triple-lumen central venous catheter via the left femoral vein with fluoroscopic guidance Interventional Radiologist:  Criselda Peaches, MD ANESTHESIA/SEDATION: Moderate (conscious) sedation was used. 1 mg  Versed, 50 mcg Fentanyl were administered intravenously. The patient's vital signs were monitored continuously by radiology nursing throughout the procedure. Sedation  Time: 60 minutes MEDICATIONS: None additional FLUOROSCOPY TIME:  16 minutes for a total of 622 mGy CONTRAST:  132m OMNIPAQUE IOHEXOL 300 MG/ML  SOLN TECHNIQUE: Informed consent was obtained from the patient following explanation of the procedure, risks, benefits and alternatives. The patient understands, agrees and consents for the procedure. All questions were addressed. A time out was performed. Maximal barrier sterile technique utilized including caps, mask, sterile gowns, sterile gloves, large sterile drape, hand hygiene, and Betadine skin prep. The left groin was interrogated with ultrasound. The common femoral artery is found to be widely patent. An image was obtained and stored for the medical record. Local anesthesia was attained by infiltration with 1% lidocaine. Under real-time sonographic guidance, the vessel was punctured with a 21 gauge micropuncture needle. Using standard technique, the initial micro wire was exchanged through a transitional 5 [place] sheath for a working 0.035 inch Bentson wire. The micro sheath was then exchanged for a working 5 [place] sheath. A C2 cobra catheter was advanced into the abdominal aorta over the wire. The catheter was initially advanced into the superior mesenteric artery and superior mesenteric arteriogram was performed. There is conventional anatomy. No replaced or accessory right hepatic artery. No evidence of active extravasation or significant collateralization through the pancreaticoduodenal arcade. The C2 cobra catheter was next advanced into the celiac artery. An arteriogram was performed. The gastroduodenal artery is elongated and slightly irregular in the mid segment likely secondary to external mass effect. There is no evidence of active extravasation. A renegade ST micro catheter was advanced coaxially over a fat and 16 wire. The micro catheter was advanced into the gastroduodenal artery. Arteriography was again performed. There are multiple  branches arising from the gastroduodenal artery providing supply to a hypervascular mass in the region the descending duodenum. The micro catheter was carefully advanced more distally into the proximal right gastroepiploic artery. Contrast injection was performed confirming the location of the catheter tip as well as confirming no additional supplied to the region of tumor. Coil embolization was then performed of the gastroduodenal artery using a combination of vortex, standard helical and soft interlock detachable micro coils ranging in size from 2-6 mm. Post embolization arteriography confirms successful embolization of the vessel with no further visible hypervascular blush of the duodenum tumor. The micro catheter was removed. Repeat celiac artery injection was performed confirming no additional collateral supply. The 5 French catheter was readvanced of the superior mesenteric artery and an additional angiogram was performed again confirming no collateral flow to the embolized territory. The catheters were removed. The vascular sheath was left in place for hemodynamic monitoring. Ultrasound was again used to interrogate the left groin. The left common femoral vein was identified. Local anesthesia was attained by infiltration with 1% lidocaine. The left common femoral vein was punctured with an 18 gauge needle. A wire was advanced into the inferior vena cava. The skin tract was dilated and an arrow triple-lumen central venous catheter advanced over the wire and position with the tip at the confluence of the IVC and left common iliac vein. The catheter was flushed, capped and secured with 0 Prolene suture. Sterile bandages were applied. COMPLICATIONS: None IMPRESSION: 1. No evidence of active extravasation. 2. 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 3. Coil embolization of the gastroduodenal artery. 4.  Placement of a left  femoral triple-lumen central venous catheter. The catheter tip is at the confluence of the IVC and iliac vein and ready for immediate use. Signed, Criselda Peaches, MD [facility] Radiology Electronically Signed   By: Jacqulynn Cadet M.D.   On: 01/23/2015 15:50   Ir Angiogram Selective Each Additional Vessel  01/23/2015  CLINICAL DATA:  65 year old male presents with acute life-threatening upper GI bleed secondary to an ulcerated mass encompassing the distal stomach and duodenum. 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 Endoscopic treatment options are limited and surgery is extremely high risk. Patient presents for emergent visceral arteriography and embolization. Additionally, central venous access is required. A triple-lumen central venous catheter will be placed during the course of the procedure. EXAM: SELECTIVE VISCERAL ARTERIOGRAPHY; IR EMBO ART VEN HEMORR LYMPH EXTRAV INC GUIDE ROADMAPPING; IR LEFT FLOURO GUIDE CV LINE; ADDITIONAL ARTERIOGRAPHY; IR ULTRASOUND GUIDANCE VASC ACCESS LEFT; ARTERIOGRAPHY Date: 01/23/2015 PROCEDURE: 1. Ultrasound-guided puncture left common femoral artery 2. Catheterization of the superior mesenteric artery with arteriogram 3. Catheterization of the celiac artery with arteriogram 4. Catheterization of the gastroduodenal artery with arteriogram 5. Catheterization of the right gastroepiploic artery with arteriogram 6. Coil embolization of the gastroduodenal artery 7. Post embolization arteriogram 8. Limited left common femoral arteriogram 9. Ultrasound-guided puncture of the left common femoral vein 10. Placement of a triple-lumen central venous catheter via the left femoral vein with fluoroscopic guidance Interventional Radiologist:  Criselda Peaches, MD ANESTHESIA/SEDATION: Moderate (conscious) sedation was used. 1 mg Versed, 50 mcg  Fentanyl were administered intravenously. The patient's vital signs were monitored continuously by radiology nursing throughout the procedure. Sedation Time: 60 minutes MEDICATIONS: None additional FLUOROSCOPY TIME:  16 minutes for a total of 622 mGy CONTRAST:  160m OMNIPAQUE IOHEXOL 300 MG/ML  SOLN TECHNIQUE: Informed consent was obtained from the patient following explanation of the procedure, risks, benefits and alternatives. The patient understands, agrees and consents for the procedure. All questions were addressed. A time out was performed. Maximal barrier sterile technique utilized including caps, mask, sterile gowns, sterile gloves, large sterile drape, hand hygiene, and Betadine skin prep. The left groin was interrogated with ultrasound. The common femoral artery is found to be widely patent. An image was obtained and stored for the medical record. Local anesthesia was attained by infiltration with 1% lidocaine. Under real-time sonographic guidance, the vessel was punctured with a 21 gauge micropuncture needle. Using standard technique, the initial micro wire was exchanged through a transitional 5 [place] sheath for a working 0.035 inch Bentson wire. The micro sheath was then exchanged for a working 5 [place] sheath. A C2 cobra catheter was advanced into the abdominal aorta over the wire. The catheter was initially advanced into the superior mesenteric artery and superior mesenteric arteriogram was performed. There is conventional anatomy. No replaced or accessory right hepatic artery. No evidence of active extravasation or significant collateralization through the pancreaticoduodenal arcade. The C2 cobra catheter was next advanced into the celiac artery. An arteriogram was performed. The gastroduodenal artery is elongated and slightly irregular in the mid segment likely secondary to external mass effect. There is no evidence of active extravasation. A renegade ST micro catheter was advanced  coaxially over a fat and 16 wire. The micro catheter was advanced into the gastroduodenal artery. Arteriography was again performed. There are multiple branches arising from the gastroduodenal artery providing supply to a hypervascular mass in the  region the descending duodenum. The micro catheter was carefully advanced more distally into the proximal right gastroepiploic artery. Contrast injection was performed confirming the location of the catheter tip as well as confirming no additional supplied to the region of tumor. Coil embolization was then performed of the gastroduodenal artery using a combination of vortex, standard helical and soft interlock detachable micro coils ranging in size from 2-6 mm. Post embolization arteriography confirms successful embolization of the vessel with no further visible hypervascular blush of the duodenum tumor. The micro catheter was removed. Repeat celiac artery injection was performed confirming no additional collateral supply. The 5 French catheter was readvanced of the superior mesenteric artery and an additional angiogram was performed again confirming no collateral flow to the embolized territory. The catheters were removed. The vascular sheath was left in place for hemodynamic monitoring. Ultrasound was again used to interrogate the left groin. The left common femoral vein was identified. Local anesthesia was attained by infiltration with 1% lidocaine. The left common femoral vein was punctured with an 18 gauge needle. A wire was advanced into the inferior vena cava. The skin tract was dilated and an arrow triple-lumen central venous catheter advanced over the wire and position with the tip at the confluence of the IVC and left common iliac vein. The catheter was flushed, capped and secured with 0 Prolene suture. Sterile bandages were applied. COMPLICATIONS: None IMPRESSION: 1. No evidence of active extravasation. 2. Positive hypervascular tumor blush in the region of the  descending duodenum with arterial supply from the gastroduodenal artery. 3. Coil embolization of the gastroduodenal artery. 4. Placement of a left femoral triple-lumen central venous catheter. The catheter tip is at the confluence of the IVC and iliac vein and ready for immediate use. Signed, Criselda Peaches, MD [facility] Radiology Electronically Signed   By: Jacqulynn Cadet M.D.   On: 01/23/2015 15:50   Ir Angiogram Follow Up Study  01/23/2015  CLINICAL DATA:  65 year old male presents with acute life-threatening upper GI bleed secondary to an ulcerated mass encompassing the distal stomach and duodenum. 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 Endoscopic treatment options are limited and surgery is extremely high risk. Patient presents for emergent visceral arteriography and embolization. Additionally, central venous access is required. A triple-lumen central venous catheter will be placed during the course of the procedure. EXAM: SELECTIVE VISCERAL ARTERIOGRAPHY; IR EMBO ART VEN HEMORR LYMPH EXTRAV INC GUIDE ROADMAPPING; IR LEFT FLOURO GUIDE CV LINE; ADDITIONAL ARTERIOGRAPHY; IR ULTRASOUND GUIDANCE VASC ACCESS LEFT; ARTERIOGRAPHY Date: 01/23/2015 PROCEDURE: 1. Ultrasound-guided puncture left common femoral artery 2. Catheterization of the superior mesenteric artery with arteriogram 3. Catheterization of the celiac artery with arteriogram 4. Catheterization of the gastroduodenal artery with arteriogram 5. Catheterization of the right gastroepiploic artery with arteriogram 6. Coil embolization of the gastroduodenal artery 7. Post embolization arteriogram 8. Limited left common femoral arteriogram 9. Ultrasound-guided puncture of the left common femoral vein 10. Placement of a triple-lumen central venous catheter via the left femoral vein with fluoroscopic guidance  Interventional Radiologist:  Criselda Peaches, MD ANESTHESIA/SEDATION: Moderate (conscious) sedation was used. 1 mg Versed, 50 mcg Fentanyl were administered intravenously. The patient's vital signs were monitored continuously by radiology nursing throughout the procedure. Sedation Time: 60 minutes MEDICATIONS: None additional FLUOROSCOPY TIME:  16 minutes for a total of 622 mGy CONTRAST:  140m OMNIPAQUE IOHEXOL 300 MG/ML  SOLN TECHNIQUE: Informed consent was obtained from the  patient following explanation of the procedure, risks, benefits and alternatives. The patient understands, agrees and consents for the procedure. All questions were addressed. A time out was performed. Maximal barrier sterile technique utilized including caps, mask, sterile gowns, sterile gloves, large sterile drape, hand hygiene, and Betadine skin prep. The left groin was interrogated with ultrasound. The common femoral artery is found to be widely patent. An image was obtained and stored for the medical record. Local anesthesia was attained by infiltration with 1% lidocaine. Under real-time sonographic guidance, the vessel was punctured with a 21 gauge micropuncture needle. Using standard technique, the initial micro wire was exchanged through a transitional 5 [place] micro sheath for a working 0.035 inch Bentson wire. The micro sheath was then exchanged for a working 5 [place] vascular sheath. A C2 cobra catheter was advanced into the abdominal aorta over the wire. The catheter was initially advanced into the superior mesenteric artery and superior mesenteric arteriogram was performed. There is conventional anatomy. No replaced or accessory right hepatic artery. No evidence of active extravasation or significant collateralization through the pancreaticoduodenal arcade. The C2 cobra catheter was next advanced into the celiac artery. An arteriogram was performed. The gastroduodenal artery is elongated and slightly irregular in the mid  segment likely secondary to external mass effect. There is no evidence of active extravasation. A renegade ST micro catheter was advanced coaxially over a fat and 16 wire. The micro catheter was advanced into the gastroduodenal artery. Arteriography was again performed. There are multiple branches arising from the gastroduodenal artery providing supply to a hypervascular mass in the region the descending duodenum. The micro catheter was carefully advanced more distally into the proximal right gastroepiploic artery. Contrast injection was performed confirming the location of the catheter tip as well as confirming no additional supplied to the region of tumor. Coil embolization was then performed of the gastroduodenal artery using a combination of vortex, standard helical and soft interlock detachable micro coils ranging in size from 2-6 mm. Post embolization arteriography confirms successful embolization of the vessel with no further visible hypervascular blush of the duodenum tumor. The micro catheter was removed. Repeat celiac artery injection was performed confirming no additional collateral supply. The 5 French catheter was readvanced of the superior mesenteric artery and an additional angiogram was performed again confirming no collateral flow to the embolized territory. The catheters were removed. The vascular sheath was left in place for hemodynamic monitoring. Ultrasound was again used to interrogate the left groin. The left common femoral vein was identified. Local anesthesia was attained by infiltration with 1% lidocaine. The left common femoral vein was punctured with an 18 gauge needle. A wire was advanced into the inferior vena cava. The skin tract was dilated and an arrow triple-lumen central venous catheter advanced over the wire and position with the tip at the confluence of the IVC and left common iliac vein. The catheter was flushed, capped and secured with 0 Prolene suture. Sterile bandages were  applied. COMPLICATIONS: None IMPRESSION: 1. No evidence of active extravasation. 2. 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 3. Coil embolization of the gastroduodenal artery. 4. Placement of a left femoral triple-lumen central venous catheter. The catheter tip is at the confluence of the IVC and iliac vein and ready for immediate use. Signed, Criselda Peaches, MD [facility] Radiology Electronically Signed   By: Jacqulynn Cadet M.D.   On: 01/23/2015 15:50  Ir Fluoro Guide Cv Line Left  01/23/2015  CLINICAL DATA:  65 year old male presents with acute life-threatening upper GI bleed secondary to an ulcerated mass encompassing the distal stomach and duodenum. 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 Endoscopic treatment options are limited and surgery is extremely high risk. Patient presents for emergent visceral arteriography and embolization. Additionally, central venous access is required. A triple-lumen central venous catheter will be placed during the course of the procedure. EXAM: SELECTIVE VISCERAL ARTERIOGRAPHY; IR EMBO ART VEN HEMORR LYMPH EXTRAV INC GUIDE ROADMAPPING; IR LEFT FLOURO GUIDE CV LINE; ADDITIONAL ARTERIOGRAPHY; IR ULTRASOUND GUIDANCE VASC ACCESS LEFT; ARTERIOGRAPHY Date: 01/23/2015 PROCEDURE: 1. Ultrasound-guided puncture left common femoral artery 2. Catheterization of the superior mesenteric artery with arteriogram 3. Catheterization of the celiac artery with arteriogram 4. Catheterization of the gastroduodenal artery with arteriogram 5. Catheterization of the right gastroepiploic artery with arteriogram 6. Coil embolization of the gastroduodenal artery 7. Post embolization arteriogram 8. Limited left common femoral arteriogram 9. Ultrasound-guided puncture of the  left common femoral vein 10. Placement of a triple-lumen central venous catheter via the left femoral vein with fluoroscopic guidance Interventional Radiologist:  Criselda Peaches, MD ANESTHESIA/SEDATION: Moderate (conscious) sedation was used. 1 mg Versed, 50 mcg Fentanyl were administered intravenously. The patient's vital signs were monitored continuously by radiology nursing throughout the procedure. Sedation Time: 60 minutes MEDICATIONS: None additional FLUOROSCOPY TIME:  16 minutes for a total of 622 mGy CONTRAST:  136m OMNIPAQUE IOHEXOL 300 MG/ML  SOLN TECHNIQUE: Informed consent was obtained from the patient following explanation of the procedure, risks, benefits and alternatives. The patient understands, agrees and consents for the procedure. All questions were addressed. A time out was performed. Maximal barrier sterile technique utilized including caps, mask, sterile gowns, sterile gloves, large sterile drape, hand hygiene, and Betadine skin prep. The left groin was interrogated with ultrasound. The common femoral artery is found to be widely patent. An image was obtained and stored for the medical record. Local anesthesia was attained by infiltration with 1% lidocaine. Under real-time sonographic guidance, the vessel was punctured with a 21 gauge micropuncture needle. Using standard technique, the initial micro wire was exchanged through a transitional 5 [place] sheath for a working 0.035 inch Bentson wire. The micro sheath was then exchanged for a working 5 [place] sheath. A C2 cobra catheter was advanced into the abdominal aorta over the wire. The catheter was initially advanced into the superior mesenteric artery and superior mesenteric arteriogram was performed. There is conventional anatomy. No replaced or accessory right hepatic artery. No evidence of active extravasation or significant collateralization through the pancreaticoduodenal arcade. The C2 cobra catheter was next  advanced into the celiac artery. An arteriogram was performed. The gastroduodenal artery is elongated and slightly irregular in the mid segment likely secondary to external mass effect. There is no evidence of active extravasation. A renegade ST micro catheter was advanced coaxially over a fat and 16 wire. The micro catheter was advanced into the gastroduodenal artery. Arteriography was again performed. There are multiple branches arising from the gastroduodenal artery providing supply to a hypervascular mass in the region the descending duodenum. The micro catheter was carefully advanced more distally into the proximal right gastroepiploic artery. Contrast injection was performed confirming the location of the catheter tip as well as confirming no additional supplied to the region of tumor. Coil embolization was then performed of the gastroduodenal artery using a combination of vortex, standard  helical and soft interlock detachable micro coils ranging in size from 2-6 mm. Post embolization arteriography confirms successful embolization of the vessel with no further visible hypervascular blush of the duodenum tumor. The micro catheter was removed. Repeat celiac artery injection was performed confirming no additional collateral supply. The 5 French catheter was readvanced of the superior mesenteric artery and an additional angiogram was performed again confirming no collateral flow to the embolized territory. The catheters were removed. The vascular sheath was left in place for hemodynamic monitoring. Ultrasound was again used to interrogate the left groin. The left common femoral vein was identified. Local anesthesia was attained by infiltration with 1% lidocaine. The left common femoral vein was punctured with an 18 gauge needle. A wire was advanced into the inferior vena cava. The skin tract was dilated and an arrow triple-lumen central venous catheter advanced over the wire and position with the tip at the  confluence of the IVC and left common iliac vein. The catheter was flushed, capped and secured with 0 Prolene suture. Sterile bandages were applied. COMPLICATIONS: None IMPRESSION: 1. No evidence of active extravasation. 2. 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 3. Coil embolization of the gastroduodenal artery. 4. Placement of a left femoral triple-lumen central venous catheter. The catheter tip is at the confluence of the IVC and iliac vein and ready for immediate use. Signed, Criselda Peaches, MD [facility] Radiology Electronically Signed   By: Jacqulynn Cadet M.D.   On: 01/23/2015 15:50   Ir Fluoro Guide Cv Line Right  01/26/2015  CLINICAL DATA:  MANTLE CELL LYMPHOMA, ACCESS FOR CHEMOTHERAPY EXAM: RIGHT INTERNAL JUGULAR SINGLE LUMEN POWER PORT CATHETER INSERTION Date:  12/23/201612/23/2016 11:02 am Radiologist:  M. Daryll Brod, MD Guidance:  Ultrasound and fluoroscopic FLUOROSCOPY TIME:  48 seconds, for mGy MEDICATIONS AND MEDICAL HISTORY: 2 g AncefAdministered within 1 hour of the procedure.2 mg Versed, 100 mcg fentanyl ANESTHESIA/SEDATION: 30 minutes CONTRAST:  None COMPLICATIONS: None immediate PROCEDURE: Informed consent was obtained from the patient following explanation of the procedure, risks, benefits and alternatives. The patient understands, agrees and consents for the procedure. All questions were addressed. A time out was performed. Maximal barrier sterile technique utilized including caps, mask, sterile gowns, sterile gloves, large sterile drape, hand hygiene, and 2% chlorhexidine scrub. Under sterile conditions and local anesthesia, right internal jugular micropuncture venous access was performed. Access was performed with ultrasound. Images were obtained for documentation. A guide wire was inserted followed by a transitional dilator. This allowed  insertion of a guide wire and catheter into the IVC. Measurements were obtained from the SVC / RA junction back to the right IJ venotomy site. In the right infraclavicular chest, a subcutaneous pocket was created over the second anterior rib. This was done under sterile conditions and local anesthesia. 1% lidocaine with epinephrine was utilized for this. A 2.5 cm incision was made in the skin. Blunt dissection was performed to create a subcutaneous pocket over the right pectoralis major muscle. The pocket was flushed with saline vigorously. There was adequate hemostasis. The port catheter was assembled and checked for leakage. The port catheter was secured in the pocket with two retention sutures. The tubing was tunneled subcutaneously to the right venotomy site and inserted into the SVC/RA junction through a valved peel-away sheath. Position was confirmed with fluoroscopy. Images were obtained for documentation. The patient tolerated the procedure well. No immediate complications. Incisions were  closed in a two layer fashion with 4 - 0 Vicryl suture. Dermabond was applied to the skin. The port catheter was accessed, blood was aspirated followed by saline and heparin flushes. Needle was removed. A dry sterile dressing was applied. IMPRESSION: Ultrasound and fluoroscopically guided right internal jugular single lumen power port catheter insertion. Tip in the SVC/RA junction. Catheter ready for use. Electronically Signed   By: Jerilynn Mages.  Shick M.D.   On: 01/26/2015 11:25   Ir US Guide Vasc Access Left  01/23/2015  CLINICAL DATA:  65 year old male presents with acute life-threatening upper GI bleed secondary to an ulcerated mass encompassing the distal stomach and duodenum. 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 Endoscopic treatment options are limited and surgery is extremely high risk. Patient presents for emergent visceral  arteriography and embolization. Additionally, central venous access is required. A triple-lumen central venous catheter will be placed during the course of the procedure. EXAM: SELECTIVE VISCERAL ARTERIOGRAPHY; IR EMBO ART VEN HEMORR LYMPH EXTRAV INC GUIDE ROADMAPPING; IR LEFT FLOURO GUIDE CV LINE; ADDITIONAL ARTERIOGRAPHY; IR ULTRASOUND GUIDANCE VASC ACCESS LEFT; ARTERIOGRAPHY Date: 01/23/2015 PROCEDURE: 1. Ultrasound-guided puncture left common femoral artery 2. Catheterization of the superior mesenteric artery with arteriogram 3. Catheterization of the celiac artery with arteriogram 4. Catheterization of the gastroduodenal artery with arteriogram 5. Catheterization of the right gastroepiploic artery with arteriogram 6. Coil embolization of the gastroduodenal artery 7. Post embolization arteriogram 8. Limited left common femoral arteriogram 9. Ultrasound-guided puncture of the left common femoral vein 10. Placement of a triple-lumen central venous catheter via the left femoral vein with fluoroscopic guidance Interventional Radiologist:  Criselda Peaches, MD ANESTHESIA/SEDATION: Moderate (conscious) sedation was used. 1 mg Versed, 50 mcg Fentanyl were administered intravenously. The patient's vital signs were monitored continuously by radiology nursing throughout the procedure. Sedation Time: 60 minutes MEDICATIONS: None additional FLUOROSCOPY TIME:  16 minutes for a total of 622 mGy CONTRAST:  124m OMNIPAQUE IOHEXOL 300 MG/ML  SOLN TECHNIQUE: Informed consent was obtained from the patient following explanation of the procedure, risks, benefits and alternatives. The patient understands, agrees and consents for the procedure. All questions were addressed. A time out was performed. Maximal barrier sterile technique utilized including caps, mask, sterile gowns, sterile gloves, large sterile drape, hand hygiene, and Betadine skin prep. The left groin was interrogated with ultrasound. The common femoral artery is  found to be widely patent. An image was obtained and stored for the medical record. Local anesthesia was attained by infiltration with 1% lidocaine. Under real-time sonographic guidance, the vessel was punctured with a 21 gauge micropuncture needle. Using standard technique, the initial micro wire was exchanged through a transitional 5 [place] sheath for a working 0.035 inch Bentson wire. The micro sheath was then exchanged for a working 5 [place] sheath. A C2 cobra catheter was advanced into the abdominal aorta over the wire. The catheter was initially advanced into the superior mesenteric artery and superior mesenteric arteriogram was performed. There is conventional anatomy. No replaced or accessory right hepatic artery. No evidence of active extravasation or significant collateralization through the pancreaticoduodenal arcade. The C2 cobra catheter was next advanced into the celiac artery. An arteriogram was performed. The gastroduodenal artery is elongated and slightly irregular in the mid segment likely secondary to external mass effect. There is no evidence of active extravasation. A renegade ST micro catheter was advanced coaxially over a fat and 16 wire. The micro catheter was advanced into  the gastroduodenal artery. Arteriography was again performed. There are multiple branches arising from the gastroduodenal artery providing supply to a hypervascular mass in the region the descending duodenum. The micro catheter was carefully advanced more distally into the proximal right gastroepiploic artery. Contrast injection was performed confirming the location of the catheter tip as well as confirming no additional supplied to the region of tumor. Coil embolization was then performed of the gastroduodenal artery using a combination of vortex, standard helical and soft interlock detachable micro coils ranging in size from 2-6 mm. Post embolization arteriography confirms successful embolization of the  vessel with no further visible hypervascular blush of the duodenum tumor. The micro catheter was removed. Repeat celiac artery injection was performed confirming no additional collateral supply. The 5 French catheter was readvanced of the superior mesenteric artery and an additional angiogram was performed again confirming no collateral flow to the embolized territory. The catheters were removed. The vascular sheath was left in place for hemodynamic monitoring. Ultrasound was again used to interrogate the left groin. The left common femoral vein was identified. Local anesthesia was attained by infiltration with 1% lidocaine. The left common femoral vein was punctured with an 18 gauge needle. A wire was advanced into the inferior vena cava. The skin tract was dilated and an arrow triple-lumen central venous catheter advanced over the wire and position with the tip at the confluence of the IVC and left common iliac vein. The catheter was flushed, capped and secured with 0 Prolene suture. Sterile bandages were applied. COMPLICATIONS: None IMPRESSION: 1. No evidence of active extravasation. 2. 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 3. Coil embolization of the gastroduodenal artery. 4. Placement of a left femoral triple-lumen central venous catheter. The catheter tip is at the confluence of the IVC and iliac vein and ready for immediate use. Signed, Criselda Peaches, MD [facility] Radiology Electronically Signed   By: Jacqulynn Cadet M.D.   On: 01/23/2015 15:50   Ir US Guide Vasc Access Left  01/23/2015  CLINICAL DATA:  65 year old male presents with acute life-threatening upper GI bleed secondary to an ulcerated mass encompassing the distal stomach and duodenum. The patient's underlying etiology is favored to represent lymphoma which should is currently undergoing  confirmatory diagnosis. Endoscopic treatment options are limited and surgery is extremely high risk. Patient presents for emergent visceral arteriography and embolization. Additionally, central venous access is required. A triple-lumen central venous catheter will be placed during the course of the procedure. EXAM: SELECTIVE VISCERAL ARTERIOGRAPHY; IR EMBO ART VEN HEMORR LYMPH EXTRAV INC GUIDE ROADMAPPING; IR LEFT FLOURO GUIDE CV LINE; ADDITIONAL ARTERIOGRAPHY; IR ULTRASOUND GUIDANCE VASC ACCESS LEFT; ARTERIOGRAPHY Date: 01/23/2015 PROCEDURE: 1. Ultrasound-guided puncture left common femoral artery 2. Catheterization of the superior mesenteric artery with arteriogram 3. Catheterization of the celiac artery with arteriogram 4. Catheterization of the gastroduodenal artery with arteriogram 5. Catheterization of the right gastroepiploic artery with arteriogram 6. Coil embolization of the gastroduodenal artery 7. Post embolization arteriogram 8. Limited left common femoral arteriogram 9. Ultrasound-guided puncture of the left common femoral vein 10. Placement of a triple-lumen central venous catheter via the left femoral vein with fluoroscopic guidance Interventional Radiologist:  Criselda Peaches, MD ANESTHESIA/SEDATION: Moderate (conscious) sedation was used. 1 mg Versed, 50 mcg Fentanyl were administered intravenously. The patient's vital signs were monitored continuously by radiology nursing throughout the procedure. Sedation Time: 60 minutes MEDICATIONS: None additional FLUOROSCOPY TIME:  16 minutes for a total of 622 mGy CONTRAST:  130m OMNIPAQUE IOHEXOL 300 MG/ML  SOLN TECHNIQUE: Informed consent was obtained from the patient following explanation of the procedure, risks, benefits and alternatives. The patient understands, agrees and consents for the procedure. All questions were addressed. A time out was performed. Maximal barrier sterile technique utilized including caps, mask, sterile gowns, sterile gloves,  large sterile drape, hand hygiene, and Betadine skin prep. The left groin was interrogated with ultrasound. The common femoral artery is found to be widely patent. An image was obtained and stored for the medical record. Local anesthesia was attained by infiltration with 1% lidocaine. Under real-time sonographic guidance, the vessel was punctured with a 21 gauge micropuncture needle. Using standard technique, the initial micro wire was exchanged through a transitional 5 [place] sheath for a working 0.035 inch Bentson wire. The micro sheath was then exchanged for a working 5 [place] sheath. A C2 cobra catheter was advanced into the abdominal aorta over the wire. The catheter was initially advanced into the superior mesenteric artery and superior mesenteric arteriogram was performed. There is conventional anatomy. No replaced or accessory right hepatic artery. No evidence of active extravasation or significant collateralization through the pancreaticoduodenal arcade. The C2 cobra catheter was next advanced into the celiac artery. An arteriogram was performed. The gastroduodenal artery is elongated and slightly irregular in the mid segment likely secondary to external mass effect. There is no evidence of active extravasation. A renegade ST micro catheter was advanced coaxially over a fat and 16 wire. The micro catheter was advanced into the gastroduodenal artery. Arteriography was again performed. There are multiple branches arising from the gastroduodenal artery providing supply to a hypervascular mass in the region the descending duodenum. The micro catheter was carefully advanced more distally into the proximal right gastroepiploic artery. Contrast injection was performed confirming the location of the catheter tip as well as confirming no additional supplied to the region of tumor. Coil embolization was then performed of the gastroduodenal artery using a combination of vortex, standard helical and soft  interlock detachable micro coils ranging in size from 2-6 mm. Post embolization arteriography confirms successful embolization of the vessel with no further visible hypervascular blush of the duodenum tumor. The micro catheter was removed. Repeat celiac artery injection was performed confirming no additional collateral supply. The 5 French catheter was readvanced of the superior mesenteric artery and an additional angiogram was performed again confirming no collateral flow to the embolized territory. The catheters were removed. The vascular sheath was left in place for hemodynamic monitoring. Ultrasound was again used to interrogate the left groin. The left common femoral vein was identified. Local anesthesia was attained by infiltration with 1% lidocaine. The left common femoral vein was punctured with an 18 gauge needle. A wire was advanced into the inferior vena cava. The skin tract was dilated and an arrow triple-lumen central venous catheter advanced over the wire and position with the tip at the confluence of the IVC and left common iliac vein. The catheter was flushed, capped and secured with 0 Prolene suture. Sterile bandages were applied. COMPLICATIONS: None IMPRESSION: 1. No evidence of active extravasation. 2. 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 3. Coil embolization of the gastroduodenal artery. 4. Placement of a left femoral triple-lumen central venous catheter. The catheter tip is at the confluence of the IVC and iliac vein and ready for immediate use. Signed, HAntonietta Jewel  Laurence Ferrari, MD [facility] Radiology Electronically Signed   By: Jacqulynn Cadet M.D.   On: 01/23/2015 15:50   Ir US Guide Vasc Access Right  01/26/2015  CLINICAL DATA:  MANTLE CELL LYMPHOMA, ACCESS FOR CHEMOTHERAPY EXAM: RIGHT INTERNAL JUGULAR SINGLE LUMEN POWER PORT CATHETER INSERTION Date:   12/23/201612/23/2016 11:02 am Radiologist:  M. Daryll Brod, MD Guidance:  Ultrasound and fluoroscopic FLUOROSCOPY TIME:  48 seconds, for mGy MEDICATIONS AND MEDICAL HISTORY: 2 g AncefAdministered within 1 hour of the procedure.2 mg Versed, 100 mcg fentanyl ANESTHESIA/SEDATION: 30 minutes CONTRAST:  None COMPLICATIONS: None immediate PROCEDURE: Informed consent was obtained from the patient following explanation of the procedure, risks, benefits and alternatives. The patient understands, agrees and consents for the procedure. All questions were addressed. A time out was performed. Maximal barrier sterile technique utilized including caps, mask, sterile gowns, sterile gloves, large sterile drape, hand hygiene, and 2% chlorhexidine scrub. Under sterile conditions and local anesthesia, right internal jugular micropuncture venous access was performed. Access was performed with ultrasound. Images were obtained for documentation. A guide wire was inserted followed by a transitional dilator. This allowed insertion of a guide wire and catheter into the IVC. Measurements were obtained from the SVC / RA junction back to the right IJ venotomy site. In the right infraclavicular chest, a subcutaneous pocket was created over the second anterior rib. This was done under sterile conditions and local anesthesia. 1% lidocaine with epinephrine was utilized for this. A 2.5 cm incision was made in the skin. Blunt dissection was performed to create a subcutaneous pocket over the right pectoralis major muscle. The pocket was flushed with saline vigorously. There was adequate hemostasis. The port catheter was assembled and checked for leakage. The port catheter was secured in the pocket with two retention sutures. The tubing was tunneled subcutaneously to the right venotomy site and inserted into the SVC/RA junction through a valved peel-away sheath. Position was confirmed with fluoroscopy. Images were obtained for documentation. The  patient tolerated the procedure well. No immediate complications. Incisions were closed in a two layer fashion with 4 - 0 Vicryl suture. Dermabond was applied to the skin. The port catheter was accessed, blood was aspirated followed by saline and heparin flushes. Needle was removed. A dry sterile dressing was applied. IMPRESSION: Ultrasound and fluoroscopically guided right internal jugular single lumen power port catheter insertion. Tip in the SVC/RA junction. Catheter ready for use. Electronically Signed   By: Jerilynn Mages.  Shick M.D.   On: 01/26/2015 11:25   Ct Biopsy  02/06/2015  INDICATION: Lymphoma. Please perform CT-guided bone marrow biopsy for tissue diagnostic purposes. EXAM: CT-GUIDED BONE MARROW BIOPSY AND ASPIRATION MEDICATIONS: Fentanyl 75 mcg IV; Versed 1.5 mg IV ANESTHESIA/SEDATION: Sedation Time 5 minutes CONTRAST:  None COMPLICATIONS: None immediate. PROCEDURE: Informed consent was obtained from the patient following an explanation of the procedure, risks, benefits and alternatives. The patient understands, agrees and consents for the procedure. All questions were addressed. A time out was performed prior to the initiation of the procedure. The patient was positioned prone and non-contrast localization CT was performed of the pelvis to demonstrate the iliac marrow spaces. The operative site was prepped and draped in the usual sterile fashion. Under sterile conditions and local anesthesia, a 22 gauge spinal needle was utilized for procedural planning. Next, an 11 gauge coaxial bone biopsy needle was advanced into the right iliac marrow space. Needle position was confirmed with CT imaging. Initially, bone marrow aspiration was performed. Next, a bone  marrow biopsy was obtained with the 11 gauge outer bone marrow device. Samples were prepared with the cytotechnologist and deemed adequate. The needle was removed intact. Hemostasis was obtained with compression and a dressing was placed. The patient tolerated  the procedure well without immediate post procedural complication. IMPRESSION: Successful CT guided right iliac bone marrow aspiration and core biopsy. Electronically Signed   By: Sandi Mariscal M.D.   On: 02/06/2015 10:18   Portable Chest Xray  01/24/2015  CLINICAL DATA:  Respiratory failure. EXAM: PORTABLE CHEST 1 VIEW COMPARISON:  01/23/2015 FINDINGS: Endotracheal tube and NG tube in stable position. Cardiomegaly with normal pulmonary vascularity.Persistent increased density noted over the right Lower lung, most likely layering pleural effusion. Underlying pulmonary infiltrate cannot be excluded. Small left pleural effusion cannot be excluded. Low lung volumes basilar atelectasis. No pneumothorax. IMPRESSION: 1. Lines and tubes in stable position. 2. Persistent increased density of the right lower lung, most likely layering pleural effusion. Underlying infiltrate cannot be excluded. 3. Low lung volumes with basilar atelectasis. Electronically Signed   By: Marcello Moores  Register   On: 01/24/2015 07:39   Dg Chest Portable 1 View  01/23/2015  CLINICAL DATA:  Assess orogastric tube positioning EXAM: PORTABLE CHEST 1 VIEW COMPARISON:  Portable chest x-ray of earlier today at 8:01 a.m. FINDINGS: The esophagogastric tube tip projects below the inferior margin of the image. There is no evidence of cord ongoing within the esophagus. The endotracheal tube tip lies 4.1 cm above the carina. The cardiac silhouette is mildly enlarged but stable. The central pulmonary vascularity is engorged especially on the right. There is mild to moderate pulmonary interstitial edema on the right. There is no pleural effusion or pneumothorax. The observed bony thorax exhibits no acute abnormality. External pacemaker -defibrillator pads. Are present IMPRESSION: 1. The esophagogastric tube tip projects below the inferior margin of the image with no evidence of coiling within the esophagus. The endotracheal tube is in appropriate position. 2.  Persistently increased perihilar interstitial density especially on the right consistent with pulmonary interstitial edema. There may be pleural fluid on the right layering posteriorly contributing to the findings here. Electronically Signed   By: David  Martinique M.D.   On: 01/23/2015 09:22   Dg Chest Port 1 View  01/23/2015  CLINICAL DATA:  Status post intubation.  Intra abdominal mass. EXAM: PORTABLE CHEST 1 VIEW COMPARISON:  CT chest 01/21/2015.  CT abdomen 01/21/2015. FINDINGS: The patient has a new endotracheal tube in place with the tip 3.5 cm above the carina. Hazy opacity over the right chest is consistent with layering pleural effusion. Smaller left pleural effusion is noted. No consolidative process or pneumothorax. Heart size is upper normal. IMPRESSION: ET tube in good position with the tip projecting 3.5 cm above the carina. Right greater than left pleural effusions. Electronically Signed   By: Inge Rise M.D.   On: 01/23/2015 08:19   [place] Guide Roadmapping  01/23/2015  CLINICAL DATA:  65 year old male presents with acute life-threatening upper GI bleed secondary to an ulcerated mass encompassing the distal stomach and duodenum. 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 Endoscopic treatment options are limited and surgery is extremely high risk. Patient presents for emergent visceral arteriography and embolization. Additionally, central venous access is required. A triple-lumen central venous catheter will be placed during the course of the procedure. EXAM: SELECTIVE VISCERAL ARTERIOGRAPHY; IR EMBO ART VEN HEMORR LYMPH EXTRAV  INC GUIDE ROADMAPPING; IR LEFT FLOURO GUIDE CV LINE; ADDITIONAL ARTERIOGRAPHY; IR ULTRASOUND GUIDANCE VASC ACCESS LEFT; ARTERIOGRAPHY Date: 01/23/2015 PROCEDURE: 1. Ultrasound-guided puncture left common femoral artery 2. Catheterization of the  superior mesenteric artery with arteriogram 3. Catheterization of the celiac artery with arteriogram 4. Catheterization of the gastroduodenal artery with arteriogram 5. Catheterization of the right gastroepiploic artery with arteriogram 6. Coil embolization of the gastroduodenal artery 7. Post embolization arteriogram 8. Limited left common femoral arteriogram 9. Ultrasound-guided puncture of the left common femoral vein 10. Placement of a triple-lumen central venous catheter via the left femoral vein with fluoroscopic guidance Interventional Radiologist:  Criselda Peaches, MD ANESTHESIA/SEDATION: Moderate (conscious) sedation was used. 1 mg Versed, 50 mcg Fentanyl were administered intravenously. The patient's vital signs were monitored continuously by radiology nursing throughout the procedure. Sedation Time: 60 minutes MEDICATIONS: None additional FLUOROSCOPY TIME:  16 minutes for a total of 622 mGy CONTRAST:  14m OMNIPAQUE IOHEXOL 300 MG/ML  SOLN TECHNIQUE: Informed consent was obtained from the patient following explanation of the procedure, risks, benefits and alternatives. The patient understands, agrees and consents for the procedure. All questions were addressed. A time out was performed. Maximal barrier sterile technique utilized including caps, mask, sterile gowns, sterile gloves, large sterile drape, hand hygiene, and Betadine skin prep. The left groin was interrogated with ultrasound. The common femoral artery is found to be widely patent. An image was obtained and stored for the medical record. Local anesthesia was attained by infiltration with 1% lidocaine. Under real-time sonographic guidance, the vessel was punctured with a 21 gauge micropuncture needle. Using standard technique, the initial micro wire was exchanged through a transitional 5 [place] sheath for a working 0.035 inch Bentson wire. The micro sheath was then exchanged for a working 5 [place] sheath. A C2 cobra catheter  was advanced into the abdominal aorta over the wire. The catheter was initially advanced into the superior mesenteric artery and superior mesenteric arteriogram was performed. There is conventional anatomy. No replaced or accessory right hepatic artery. No evidence of active extravasation or significant collateralization through the pancreaticoduodenal arcade. The C2 cobra catheter was next advanced into the celiac artery. An arteriogram was performed. The gastroduodenal artery is elongated and slightly irregular in the mid segment likely secondary to external mass effect. There is no evidence of active extravasation. A renegade ST micro catheter was advanced coaxially over a fat and 16 wire. The micro catheter was advanced into the gastroduodenal artery. Arteriography was again performed. There are multiple branches arising from the gastroduodenal artery providing supply to a hypervascular mass in the region the descending duodenum. The micro catheter was carefully advanced more distally into the proximal right gastroepiploic artery. Contrast injection was performed confirming the location of the catheter tip as well as confirming no additional supplied to the region of tumor. Coil embolization was then performed of the gastroduodenal artery using a combination of vortex, standard helical and soft interlock detachable micro coils ranging in size from 2-6 mm. Post embolization arteriography confirms successful embolization of the vessel with no further visible hypervascular blush of the duodenum tumor. The micro catheter was removed. Repeat celiac artery injection was performed confirming no additional collateral supply. The 5 French catheter was readvanced of the superior mesenteric artery and an additional angiogram was performed again confirming no collateral flow to the embolized territory. The catheters were removed. The vascular sheath was left in place for hemodynamic monitoring. Ultrasound was again used to  interrogate the left groin.  The left common femoral vein was identified. Local anesthesia was attained by infiltration with 1% lidocaine. The left common femoral vein was punctured with an 18 gauge needle. A wire was advanced into the inferior vena cava. The skin tract was dilated and an arrow triple-lumen central venous catheter advanced over the wire and position with the tip at the confluence of the IVC and left common iliac vein. The catheter was flushed, capped and secured with 0 Prolene suture. Sterile bandages were applied. COMPLICATIONS: None IMPRESSION: 1. No evidence of active extravasation. 2. 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 3. Coil embolization of the gastroduodenal artery. 4. Placement of a left femoral triple-lumen central venous catheter. The catheter tip is at the confluence of the IVC and iliac vein and ready for immediate use. Signed, Criselda Peaches, MD [facility] Radiology Electronically Signed   By: Jacqulynn Cadet M.D.   On: 01/23/2015 15:50   US Abdomen Limited Ruq  01/19/2015  CLINICAL DATA:  Transaminitis. History of duodenal ulcer with adherent clot. EXAM: US ABDOMEN LIMITED - RIGHT UPPER QUADRANT COMPARISON:  None. FINDINGS: Gallbladder: No gallstones or wall thickening visualized. Minimal dependent sludge. No sonographic Murphy sign noted. Small amounts of pericholecystic free fluid. Common bile duct: Diameter: 4.2 mm. Liver: Ill-defined heterogeneous echogenicity in the region of the pancreatic head and porta hepatis, borders not well-defined. Mildly heterogeneous hepatic parenchyma. No intrahepatic biliary ductal dilatation. Normal directional flow in the main portal vein. Right pleural effusion, incidentally noted. IMPRESSION: 1. Heterogeneous echogenicity in the region of the porta hepatis and pancreatic head, difficult to  separate from the adjacent liver parenchyma. This may reflect hematoma/blood products in the setting of bleeding duodenal ulcer versus pancreatic head or focal liver lesion. There is no biliary ductal dilatation. Recommend further characterization with cross-sectional imaging, contrast-enhanced CT or MRI. 2. Minimal sludge in the gallbladder, no wall thickening or gallstones. Small amount of pericholecystic fluid. No sonographic Murphy sign or findings of acute cholecystitis. Electronically Signed   By: Jeb Levering M.D.   On: 01/19/2015 20:48    ASSESSMENT: 65 y.o. [place] man with a diagnosis of mantle cell non-Hodgkin's lymphoma presenting with syncope secondary to bleeding from a large ulcerated duodenal ulcer  (a) s/p coil embolization of the feeding (gastro-duodenal) artery 01/23/2015  (b) anemia--scant iron stores on bone marrow biopsy  (1) right inguinal lymph node biopsy 01/22/2015 confirms mantle cell lymphoma  (a) bone marrow biopsy 02/06/2015 positive for involvement by the patient's mantle cell lymphoma  (b) IPI score of 5 (high risk) predicts a 5 year progression free survival of 50% with CHOP-Rituxan chemotherapy  (c) MIPI score of 5 (intermediate risk) predicts a median survival of 58 months  (2) CHOP/Rituxan started 02/09/2015  (3)  Referral placed to [facility] transplant group for consideration of consolidative transplant in [place]  PLAN: Will is finally ready to start treatment for his mantle cell lymphoma. He is 2 weeks out from his embolization procedure, so I expect the risk of perforation 2 be significantly decreased. This remains however a risk and I have asked them to proceed immediately to the emergency room if he has sudden abdominal pain, or if he starts feeling faint as he has in the past when he develops GI bleeding.  I spent approximately 55 minutes with him and his wife today going over the situation.We discussed the possible toxicities, side effects and  complications of  his chemotherapy agents and he has a good understanding of these. He agrees to proceed.  We have written for ondansetron and prochlorperazine for nausea. He already started his prednisone this morning. He has lorazepam to take at bedtime if he finds insomnia becomes a major issue secondary to the steroids.  He will receive onpro so he does not have to return in 3 days especially with the bad weather coming.  He will see me again in a week. We will check his counts at that time I will also give him on iron infusion to try to optimize his marrow response to anemia.  I have discussed this case in a preliminary fashion with Dr. Nadara Mustard at [facility]. She will be seeing him at some point within the next 2 months to start consideration of consolidative transplantation after chemotherapy.  Will has a good understanding of the overall plan. She agrees with it. She knows the goal of treatment in her case is cure. She will call with any problems that may develop before her next visit here.  Chauncey Cruel, MD   02/09/2015 9:03 AM Medical Oncology and Hematology [facility] [address] [place], [place] [address] Tel. [phone]    Fax. [phone]

## 2015-02-11 ENCOUNTER — Other Ambulatory Visit: Payer: Self-pay | Admitting: Oncology

## 2015-02-11 DIAGNOSIS — K276 Chronic or unspecified peptic ulcer, site unspecified, with both hemorrhage and perforation: Secondary | ICD-10-CM

## 2015-02-11 DIAGNOSIS — C8319 Mantle cell lymphoma, extranodal and solid organ sites: Secondary | ICD-10-CM

## 2015-02-11 LAB — VITAMIN B12: Vitamin B-12: 653 pg/mL (ref 211–911)

## 2015-02-11 LAB — FOLATE RBC: RBC FOLATE: 652 ng/mL (ref >280)

## 2015-02-12 ENCOUNTER — Telehealth: Payer: Self-pay | Admitting: Oncology

## 2015-02-12 ENCOUNTER — Ambulatory Visit: Payer: BLUE CROSS/BLUE SHIELD

## 2015-02-12 ENCOUNTER — Other Ambulatory Visit: Payer: Self-pay | Admitting: Oncology

## 2015-02-12 LAB — CHROMOSOME ANALYSIS, BONE MARROW

## 2015-02-12 NOTE — Telephone Encounter (Signed)
Called patient and he is able to come in 1/10 and 1/17 for iron

## 2015-02-13 ENCOUNTER — Ambulatory Visit (HOSPITAL_BASED_OUTPATIENT_CLINIC_OR_DEPARTMENT_OTHER): Payer: BLUE CROSS/BLUE SHIELD

## 2015-02-13 VITALS — BP 124/70 | HR 70 | Temp 98.3°F

## 2015-02-13 DIAGNOSIS — D649 Anemia, unspecified: Secondary | ICD-10-CM | POA: Diagnosis not present

## 2015-02-13 DIAGNOSIS — K922 Gastrointestinal hemorrhage, unspecified: Secondary | ICD-10-CM

## 2015-02-13 DIAGNOSIS — D630 Anemia in neoplastic disease: Secondary | ICD-10-CM

## 2015-02-13 DIAGNOSIS — K276 Chronic or unspecified peptic ulcer, site unspecified, with both hemorrhage and perforation: Secondary | ICD-10-CM

## 2015-02-13 MED ORDER — SODIUM CHLORIDE 0.9 % IV SOLN
Freq: Once | INTRAVENOUS | Status: AC
  Administered 2015-02-13: 14:00:00 via INTRAVENOUS

## 2015-02-13 MED ORDER — SODIUM CHLORIDE 0.9 % IV SOLN
510.0000 mg | Freq: Once | INTRAVENOUS | Status: AC
  Administered 2015-02-13: 510 mg via INTRAVENOUS
  Filled 2015-02-13: qty 17

## 2015-02-13 NOTE — Progress Notes (Signed)
Pt observed for 30 minutes post faraheme infusion. No complications.

## 2015-02-13 NOTE — Patient Instructions (Signed)

## 2015-02-16 ENCOUNTER — Other Ambulatory Visit (HOSPITAL_BASED_OUTPATIENT_CLINIC_OR_DEPARTMENT_OTHER): Payer: BLUE CROSS/BLUE SHIELD

## 2015-02-16 ENCOUNTER — Ambulatory Visit (HOSPITAL_BASED_OUTPATIENT_CLINIC_OR_DEPARTMENT_OTHER): Payer: BLUE CROSS/BLUE SHIELD | Admitting: Oncology

## 2015-02-16 VITALS — BP 99/73 | HR 94 | Temp 98.4°F | Resp 19 | Ht 70.0 in | Wt 192.4 lb

## 2015-02-16 DIAGNOSIS — C8315 Mantle cell lymphoma, lymph nodes of inguinal region and lower limb: Secondary | ICD-10-CM

## 2015-02-16 DIAGNOSIS — D649 Anemia, unspecified: Secondary | ICD-10-CM

## 2015-02-16 DIAGNOSIS — K274 Chronic or unspecified peptic ulcer, site unspecified, with hemorrhage: Secondary | ICD-10-CM

## 2015-02-16 DIAGNOSIS — K276 Chronic or unspecified peptic ulcer, site unspecified, with both hemorrhage and perforation: Secondary | ICD-10-CM

## 2015-02-16 DIAGNOSIS — C8319 Mantle cell lymphoma, extranodal and solid organ sites: Secondary | ICD-10-CM

## 2015-02-16 DIAGNOSIS — K922 Gastrointestinal hemorrhage, unspecified: Secondary | ICD-10-CM

## 2015-02-16 LAB — CBC WITH DIFFERENTIAL/PLATELET
BASO%: 0.3 % (ref 0.0–2.0)
Basophils Absolute: 0.1 10*3/uL (ref 0.0–0.1)
EOS%: 1 % (ref 0.0–7.0)
Eosinophils Absolute: 0.2 10*3/uL (ref 0.0–0.5)
HCT: 32.7 % — ABNORMAL LOW (ref 38.4–49.9)
HGB: 10.6 g/dL — ABNORMAL LOW (ref 13.0–17.1)
LYMPH%: 4.4 % — ABNORMAL LOW (ref 14.0–49.0)
MCH: 27.3 pg (ref 27.2–33.4)
MCHC: 32.3 g/dL (ref 32.0–36.0)
MCV: 84.5 fL (ref 79.3–98.0)
MONO#: 0.7 10*3/uL (ref 0.1–0.9)
MONO%: 3.8 % (ref 0.0–14.0)
NEUT#: 16.5 10*3/uL — ABNORMAL HIGH (ref 1.5–6.5)
NEUT%: 90.5 % — ABNORMAL HIGH (ref 39.0–75.0)
Platelets: 441 10*3/uL — ABNORMAL HIGH (ref 140–400)
RBC: 3.87 10*6/uL — ABNORMAL LOW (ref 4.20–5.82)
RDW: 16.8 % — ABNORMAL HIGH (ref 11.0–14.6)
WBC: 18.2 10*3/uL — ABNORMAL HIGH (ref 4.0–10.3)
lymph#: 0.8 10*3/uL — ABNORMAL LOW (ref 0.9–3.3)

## 2015-02-16 LAB — IRON AND TIBC
%SAT: 33 % (ref 20–55)
Iron: 115 ug/dL (ref 42–163)
TIBC: 345 ug/dL (ref 202–409)
UIBC: 230 ug/dL (ref 117–376)

## 2015-02-16 NOTE — Progress Notes (Signed)
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 He "blew this off" and it did not happen again until 01/17/2015 when he felt weak at work and later fainted at home. He was brought to the ED where he was found to be guaiac positive (denies melena before that date) with a Hb of 4.5 MCV 82.2, platelets 295K, WBC 9.1 and normal INR and PTT. He was admitted and underwent EGD under Dr Michail Sermon 01/18/2015 showing a very large duodenal ulcer w clot. Abdominal US 01/19/2015 was not very informative but CT abd/pelvis 12/17,2016 showed adenopathy in the anterior mediastinum, retroperitoneum and pelvis, with a large ulcerated mass involving the distal stomach/proximal duodenum and extending into the gastrohepatic region.   On 01/22/2015 he underwent right inguinal lymph node biopsy, consistent with mantle cell lymphoma (([id number] and [id number])). Specifically the lymph node showed effacement of the architecture by sheets of small to medium sized lymphocytes with no apparent nodularity. The cells were positive for CD20, CD5, BCL-2, CD21, and cyclin D1. He 67 was low (10-20%). CD21 revealed a few residual germinal centers area at the B cells were negative for CD10, and BCL-6. CD23 was negative.  On 02/06/2015 the patient  underwent a bone marrow aspirate and biopsy. The pathology from this procedure (FZB 17-2 and 17-7) was positive for involvement by non-Hodgkin's lymphoma with numerous atypical interstitial and paratrabecular lymphoid aggregates consistent with mantle cell lymphoma. Iron stain showed scant iron flow cytometry confirmed a monoclonal B-cell population, lambda restricted, consistent with the patient's known diagnosis of mantle cell lymphoma.  On 01/18/2015 the patient underwent EGD which found a massive duodenal bulb ulcer with adherent clot, although no active bleeding.his hemoglobin was in the 8.4 range and stable. He was discharged on 01/22/2015, but within 24 hours was readmitted with syncope and altered mental status. He had aspirated and required intubation for his respiratory failure. Hemoglobin was down to 6.3. An NG tube was placed, the patient was multiply transfused, and Surgery was considered but felt not to be feasible.accordingly the patient underwent arteriography 01/23/2015 which found the superior mesenteric and celiac arteries not to be the involved vessels. The gastroduodenal artery showed multiple branches providing supply to the hypervascular duodenal mass, and this artery was coil embolized.. Also a port was placed at the time of this procedure with a view to eventual chemotherapy.  He was discharged 01/26/2015,but required readmission 02/07/2015 for another presyncopal episode. He presented to the emergency room where he was found to have a hemoglobin of 7.5. The patient was transfusedIIA hemoglobin of 10 and was discharged 02/07/2014. He was scheduled to start CHOP right toxin 63/07/2014.  His subsequent history is as detailed below.  INTERVAL HISTORY: Will returns today for follow-up of his mantle cell lymphoma. Today is day 8 cycle 1 of 6 planned cycles of CHOP/rituximab, given every 3 weeks.  He tolerated cycle 1  remarkably well. He had no nausea or vomiting problems. He took his  nausea medications exactly as prescribed. So far he has had no hair loss but he is using coconut oil on his scalp and he is confident that will help him keep his hair.  REVIEW OF SYSTEMS: The big concern of course has been whether he would develop perforation because of his duodenal ulcer. There have been no signs of that. He is now eating a regular diet. Today he has "one of the best bowel movements" he has ever had. It stopped up the toilet he says. He tolerated the prednisone with no thrush or other immediate side effects. He has increased appetite. He since of taste is "too good". He has excellent energy. He is ready to start working out but wisely asks if there are some restrictions he needs to observe at this point. There have been no bleeding, rash, or fever problems. A detailed review of systems today was otherwise benign.  PAST MEDICAL HISTORY: Past Medical History  Diagnosis Date  . Essential hypertension   . HLD (hyperlipidemia)   . GERD (gastroesophageal reflux disease)   . Anemia   . Cancer ([place])     Lymphoma  . GI bleed   . Weakness     PAST SURGICAL HISTORY: Past Surgical History  Procedure Laterality Date  . Skin surgery      Small benign cysts over left scalp removed  . Esophagogastroduodenoscopy Left 01/18/2015    Procedure: ESOPHAGOGASTRODUODENOSCOPY (EGD);  Surgeon: Wilford Corner, MD;  [facility] ENDOSCOPY;  Service: Endoscopy;  Laterality: Left;  . Inguinal hernia repair Right 01/22/2015    Procedure: RIGHT INGUINAL LYMPH NODE BX;  Surgeon: Donnie Mesa, MD;  Location: [facility];  Service: General;  Laterality: Right;  . Portacath placement  01/26/2015     power port with tip SVC/RA [place]    FAMILY HISTORY Family History  Problem Relation Age of Onset  . Hypertension Mother   . Hypertension Father   . Hypertension Sister   . Diabetes Sister   . Prostate cancer Brother   . Lupus Sister   . Kidney failure Father   The patient's father died from CHF  complications age 27. The patient's mother is 17 y/o as of December 2016. The patient has 9 brothers, 8 sisters. One brother has prostate cancer. There is no other cancer history in the family to his knowledge.  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 His wife of 48 years, [name], is disabled due to RA. Daughtr [name] works as a Teacher, early years/pre for [employer] (she previously worked at the [employer] in records). Son [name] owns a window washing business. Son [name] is in Press photographer at [employer]. All live in [place]. The patient has 3 grandchildren. He attends a local [employer] DIRECTIVES: Not in place   HEALTH MAINTENANCE: Social History  Substance Use Topics  . Smoking status: Never Smoker   . Smokeless tobacco: Never Used  . Alcohol Use: Yes     Comment: socially     Colonoscopy:  PSA: 2.27 on 01/22/2015  Hepatitis serologies (be surface antigen, hepatitis B and hepatitis see IgM) negative 01/18/2015  No Known Allergies  Current Outpatient Prescriptions  Medication Sig Dispense Refill  . allopurinol (ZYLOPRIM) 300 MG tablet Take 1 tablet (300 mg total) by mouth daily. 30 tablet 3  . lisinopril-hydrochlorothiazide (PRINZIDE,ZESTORETIC) 20-25 MG tablet Take 1 tablet  by mouth daily.    Marland Kitchen LORazepam (ATIVAN) 0.5 MG tablet Take 1 tablet (0.5 mg total) by mouth every 6 (six) hours as needed (Nausea or vomiting). 30 tablet 0  . lovastatin (MEVACOR) 20 MG tablet Take 20 mg by mouth at bedtime.    Marland Kitchen omeprazole (PRILOSEC) 40 MG capsule Take 1 capsule (40 mg total) by mouth 2 (two) times daily. 60 capsule 0  . ondansetron (ZOFRAN) 8 MG tablet Take 1 tablet (8 mg total) by mouth 2 (two) times daily. Start the day after chemo for 3 days. Then as needed for nausea or vomiting. 30 tablet 1  . predniSONE (DELTASONE) 20 MG tablet Take 3 tablets a day ( in am ) with food x 5 days starting on day 1 of treatment plan 15 tablet 6  .  prochlorperazine (COMPAZINE) 10 MG tablet Take 1 tablet (10 mg total) by mouth every 6 (six) hours as needed (Nausea or vomiting). 30 tablet 6  . sucralfate (CARAFATE) 1 g tablet Take 1 tablet (1 g total) by mouth 4 (four) times daily -  with meals and at bedtime. 90 tablet 0   No current facility-administered medications for this visit.    OBJECTIVE: Middle-aged African-American man in no acute distress Filed Vitals:   02/16/15 1357  BP: 99/73  Pulse: 94  Temp: 98.4 F (36.9 C)  Resp: 19     Body mass index is 27.61 kg/(m^2).    ECOG FS:0 - Asymptomatic  Sclerae unicteric, EOMs intact Oropharynx clear, dentition in good repair No cervical or supraclavicular adenopathy Lungs no rales or rhonchi Heart regular rate and rhythm Abd soft, nontender, positive bowel sounds MSK no focal spinal tenderness, no upper extremity lymphedema Neuro: nonfocal, well oriented, appropriate affect   LAB RESULTS:  CMP     Component Value Date/Time   NA 136 02/09/2015 1011   NA 138 02/08/2015 0705   K 4.6 02/09/2015 1011   K 4.0 02/08/2015 0705   CL 102 02/08/2015 0705   CO2 25 02/09/2015 1011   CO2 27 02/08/2015 0705   GLUCOSE 117 02/09/2015 1011   GLUCOSE 115* 02/08/2015 0705   BUN 18.8 02/09/2015 1011   BUN 22* 02/08/2015 0705   CREATININE 1.0 02/09/2015 1011   CREATININE 1.11 02/08/2015 0705   CALCIUM 8.9 02/09/2015 1011   CALCIUM 8.9 02/08/2015 0705   PROT 6.9 02/09/2015 1011   PROT 5.4* 01/27/2015 0544   ALBUMIN 3.3* 02/09/2015 1011   ALBUMIN 2.5* 01/27/2015 0544   AST 14 02/09/2015 1011   AST 24 01/27/2015 0544   ALT 11 02/09/2015 1011   ALT 23 01/27/2015 0544   ALKPHOS 57 02/09/2015 1011   ALKPHOS 55 01/27/2015 0544   BILITOT 0.32 02/09/2015 1011   BILITOT 0.6 01/27/2015 0544   GFRNONAA >60 02/08/2015 0705   GFRAA >60 02/08/2015 0705    INo results found for: SPEP, UPEP  Lab Results  Component Value Date   WBC 18.2* 02/16/2015   NEUTROABS 16.5* 02/16/2015   HGB  10.6* 02/16/2015   HCT 32.7* 02/16/2015   MCV 84.5 02/16/2015   PLT 441* 02/16/2015      Chemistry      Component Value Date/Time   NA 136 02/09/2015 1011   NA 138 02/08/2015 0705   K 4.6 02/09/2015 1011   K 4.0 02/08/2015 0705   CL 102 02/08/2015 0705   CO2 25 02/09/2015 1011   CO2 27 02/08/2015 0705   BUN 18.8 02/09/2015 1011   BUN 22*  02/08/2015 0705   CREATININE 1.0 02/09/2015 1011   CREATININE 1.11 02/08/2015 0705      Component Value Date/Time   CALCIUM 8.9 02/09/2015 1011   CALCIUM 8.9 02/08/2015 0705   ALKPHOS 57 02/09/2015 1011   ALKPHOS 55 01/27/2015 0544   AST 14 02/09/2015 1011   AST 24 01/27/2015 0544   ALT 11 02/09/2015 1011   ALT 23 01/27/2015 0544   BILITOT 0.32 02/09/2015 1011   BILITOT 0.6 01/27/2015 0544       No results found for: LABCA2  No components found for: LABCA125  No results for input(s): INR in the last 168 hours.  Urinalysis    Component Value Date/Time   COLORURINE YELLOW 01/23/2015 0805   APPEARANCEUR CLEAR 01/23/2015 0805   LABSPEC 1.014 01/23/2015 0805   PHURINE 7.5 01/23/2015 0805   GLUCOSEU NEGATIVE 01/23/2015 0805   HGBUR NEGATIVE 01/23/2015 0805   BILIRUBINUR NEGATIVE 01/23/2015 0805   KETONESUR NEGATIVE 01/23/2015 0805   PROTEINUR NEGATIVE 01/23/2015 0805   NITRITE NEGATIVE 01/23/2015 0805   LEUKOCYTESUR NEGATIVE 01/23/2015 0805    STUDIES: Ct Head Wo Contrast  01/23/2015  CLINICAL DATA:  65 year old male with fall. EXAM: CT HEAD WITHOUT CONTRAST CT CERVICAL SPINE WITHOUT CONTRAST TECHNIQUE: Multidetector CT imaging of the head and cervical spine was performed following the standard protocol without intravenous contrast. Multiplanar CT image reconstructions of the cervical spine were also generated. COMPARISON:  None. FINDINGS: CT HEAD FINDINGS The ventricles and sulci are appropriate in size for patient's age. Mild periventricular and deep white matter hypodensities represent chronic microvascular ischemic changes.  There is no intracranial hemorrhage. No mass effect or midline shift identified. An endotracheal and enteric tube are partially visualized. There is diffuse mucoperiosteal thickening of the paranasal sinuses with partial opacification of the ethmoid air cells. The mastoid air cells are well aerated. Right occipital scalp hematoma. The calvarium is intact. CT CERVICAL SPINE FINDINGS There is no acute fracture or subluxation of the cervical spine.Mild multilevel degenerative changes.The odontoid and spinous processes are intact.There is normal anatomic alignment of the C1-C2 lateral masses. The visualized soft tissues appear unremarkable. An enteric tube and an endotracheal tube are partially visualized. Partially visualized bilateral pleural effusions, right greater than left. There is bilateral supraclavicular and right cervical adenopathy. Upper mediastinal lymphadenopathy noted. Chest CT is recommended for further evaluation. Right supraclavicular soft tissue hemorrhage noted. IMPRESSION: No acute intracranial hemorrhage. 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 No acute/traumatic cervical spine pathology. Bilateral supraclavicular and right cervical as well as mediastinal adenopathy. Further evaluation recommended. Electronically Signed   By: Anner Crete M.D.   On: 01/23/2015 18:27   Ct Chest W Contrast  01/21/2015  CLINICAL DATA:  Mediastinal mass. EXAM: CT CHEST WITH CONTRAST TECHNIQUE: Multidetector CT imaging of the chest was performed during intravenous contrast administration. CONTRAST:  92m OMNIPAQUE IOHEXOL 300 MG/ML  SOLN COMPARISON:  CT abdomen and pelvis dated 01/20/2015. FINDINGS: On yesterday's CT of the abdomen and pelvis, a large ulcerated mass involving the distal stomach and proximal duodenum was identified, with extension to the porta hepatis and gastrohepatic region, suggesting lymphoma. Associated lymphadenopathy was identified in the lower anterior  mediastinum, retroperitoneum and pelvis, further suggesting lymphoma. Additional lymphadenopathy was suspected in the sub-carinal region of the mediastinum, incompletely imaged. On today's study, conglomerate subcarinal lymphadenopathy is confirmed, measuring 7.5 x 2.8 cm on image 33 of series 201. This conglomerate lymphadenopathy extends upwards within the mediastinum encasing the mid and lower portions of the trachea,  without effacement of the trachea. At the level of the lower trachea, the conglomerate lymphadenopathy measures approximately 5.9 x 3.9 cm. Additional lymphadenopathy within the anterior mediastinum measures 4.4 x 2.3 cm (image 17, series 201). Additional smaller lymph nodes scattered throughout the mediastinum and bilateral perihilar regions. Numerous small and moderately enlarged lymph nodes are seen within each axillary region. Clustered small and enlarged lymph nodes are seen within the supraclavicular regions bilaterally. There is a right pleural effusion, small to moderate in size, with adjacent compressive atelectasis. There is also a small left pleural effusion with adjacent atelectasis. Very mild degenerative change noted within the cervical and thoracic spine. No acute osseous abnormality identified. Thoracic aorta is normal in caliber and configuration. Heart size is normal. No pericardial effusion. No central obstructing pulmonary embolism seen. Upper abdominal findings described on yesterday's CT. IMPRESSION: 1. Extensive mediastinal lymphadenopathy, bilateral axillary lymphadenopathy and supraclavicular lymphadenopathy. 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 2. Right pleural effusion, small to moderate in size, with adjacent atelectasis. Additional smaller left pleural effusion. Electronically Signed   By: Franki Cabot M.D.   On: 01/21/2015 16:47   Ct Cervical Spine Wo Contrast  01/23/2015   CLINICAL DATA:  65 year old male with fall. EXAM: CT HEAD WITHOUT CONTRAST CT CERVICAL SPINE WITHOUT CONTRAST TECHNIQUE: Multidetector CT imaging of the head and cervical spine was performed following the standard protocol without intravenous contrast. Multiplanar CT image reconstructions of the cervical spine were also generated. COMPARISON:  None. FINDINGS: CT HEAD FINDINGS The ventricles and sulci are appropriate in size for patient's age. Mild periventricular and deep white matter hypodensities represent chronic microvascular ischemic changes. There is no intracranial hemorrhage. No mass effect or midline shift identified. An endotracheal and enteric tube are partially visualized. There is diffuse mucoperiosteal thickening of the paranasal sinuses with partial opacification of the ethmoid air cells. The mastoid air cells are well aerated. Right occipital scalp hematoma. The calvarium is intact. CT CERVICAL SPINE FINDINGS There is no acute fracture or subluxation of the cervical spine.Mild multilevel degenerative changes.The odontoid and spinous processes are intact.There is normal anatomic alignment of the C1-C2 lateral masses. The visualized soft tissues appear unremarkable. An enteric tube and an endotracheal tube are partially visualized. Partially visualized bilateral pleural effusions, right greater than left. There is bilateral supraclavicular and right cervical adenopathy. Upper mediastinal lymphadenopathy noted. Chest CT is recommended for further evaluation. Right supraclavicular soft tissue hemorrhage noted. IMPRESSION: No acute intracranial hemorrhage. 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 No acute/traumatic cervical spine pathology. Bilateral supraclavicular and right cervical as well as mediastinal adenopathy. Further evaluation recommended. Electronically Signed   By: Anner Crete M.D.   On: 01/23/2015 18:27   Ct Abdomen Pelvis W Contrast  01/20/2015   CLINICAL DATA:  GI bleed with syncopal episodes. Massive duodenal bulb ulcer by EGD. Abnormal abdominal ultrasound demonstrating heterogeneous soft tissue prominence in the region of the porta hepatis pancreatic head. EXAM: CT ABDOMEN AND PELVIS WITH CONTRAST TECHNIQUE: Multidetector CT imaging of the abdomen and pelvis was performed using the standard protocol following bolus administration of intravenous contrast. CONTRAST:  123m OMNIPAQUE IOHEXOL 300 MG/ML  SOLN COMPARISON:  Right upper quadrant abdominal ultrasound on 01/19/2015. FINDINGS: In the lower chest, soft tissue mass in the anterior lower mediastinum abuts the pericardium and measures roughly 4.0 x 7.7 cm in axial dimensions. This is most likely consistent with a lymph node mass. The superior images  also show potentially the bottom margin of subcarinal lymphadenopathy. There are small bilateral pleural effusions, right greater than left. In abdomen, a large confluent mass is identified involving the distal stomach and proximal duodenum with visible ulceration. At the level of the proximal duodenum, thickening of the duodenal wall approaches 3.5 cm. Soft tissue mass extends into the porta hepatis and gastrohepatic region and likely tracks along portal triads into the liver. Soft tissue mass also abuts and obscures definition of the pancreatic head and proximal pancreatic body. Mass also surrounds the portal vein, portal confluence and abuts the superior mesenteric vein. There is associated retroperitoneal lymphadenopathy in the lower abdomen and pelvis. Extensive bilateral iliac chain lymphadenopathy present. Elongated external iliac lymph node masses bilaterally measure roughly 1.8 cm in short axis. Multiple mildly enlarged bilateral inguinal lymph nodes also present. 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 No evidence of bowel obstruction or free intraperitoneal air. No  focal abscess is identified. The gallbladder, distal pancreas, spleen, adrenal glands and kidneys appear unremarkable. No bony lesions are seen. The bladder is decompressed. No hernias are identified. IMPRESSION: Large ulcerated mass involving the distal stomach and proximal duodenum and extending into the porta hepatis and gastrohepatic region. Associated lymphadenopathy in the lower anterior mediastinum, retroperitoneum and pelvis. Also suspected subcarinal lymphadenopathy is partially visualized. 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 Electronically Signed   By: Aletta Edouard M.D.   On: 01/20/2015 13:50   Ir Angiogram Visceral Selective  01/23/2015  CLINICAL DATA:  65 year old male presents with acute life-threatening upper GI bleed secondary to an ulcerated mass encompassing the distal stomach and duodenum. 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 Endoscopic treatment options are limited and surgery is extremely high risk. Patient presents for emergent visceral arteriography and embolization. Additionally, central venous access is required. A triple-lumen central venous catheter will be placed during the course of the procedure. EXAM: SELECTIVE VISCERAL ARTERIOGRAPHY; IR EMBO ART VEN HEMORR LYMPH EXTRAV INC GUIDE ROADMAPPING; IR LEFT FLOURO GUIDE CV LINE; ADDITIONAL ARTERIOGRAPHY; IR ULTRASOUND GUIDANCE VASC ACCESS LEFT; ARTERIOGRAPHY Date: 01/23/2015 PROCEDURE: 1. Ultrasound-guided puncture left common femoral artery 2. Catheterization of the superior mesenteric artery with arteriogram 3. Catheterization of the celiac artery with arteriogram 4. Catheterization of the gastroduodenal artery with arteriogram 5. Catheterization of the right gastroepiploic artery with arteriogram 6. Coil embolization of the gastroduodenal artery 7. Post embolization arteriogram 8. Limited left common femoral  arteriogram 9. Ultrasound-guided puncture of the left common femoral vein 10. Placement of a triple-lumen central venous catheter via the left femoral vein with fluoroscopic guidance Interventional Radiologist:  Criselda Peaches, MD ANESTHESIA/SEDATION: Moderate (conscious) sedation was used. 1 mg Versed, 50 mcg Fentanyl were administered intravenously. The patient's vital signs were monitored continuously by radiology nursing throughout the procedure. Sedation Time: 60 minutes MEDICATIONS: None additional FLUOROSCOPY TIME:  16 minutes for a total of 622 mGy CONTRAST:  146m OMNIPAQUE IOHEXOL 300 MG/ML  SOLN TECHNIQUE: Informed consent was obtained from the patient following explanation of the procedure, risks, benefits and alternatives. The patient understands, agrees and consents for the procedure. All questions were addressed. A time out was performed. Maximal barrier sterile technique utilized including caps, mask, sterile gowns, sterile gloves, large sterile drape, hand hygiene, and Betadine skin prep. The left groin was interrogated with ultrasound. The common femoral artery is found to be widely patent. An image was obtained and stored  for the medical record. Local anesthesia was attained by infiltration with 1% lidocaine. Under real-time sonographic guidance, the vessel was punctured with a 21 gauge micropuncture needle. Using standard technique, the initial micro wire was exchanged through a transitional 5 [place] micro sheath for a working 0.035 inch Bentson wire. The micro sheath was then exchanged for a working 5 [place] vascular sheath. A C2 cobra catheter was advanced into the abdominal aorta over the wire. The catheter was initially advanced into the superior mesenteric artery and superior mesenteric arteriogram was performed. There is conventional anatomy. No replaced or accessory right hepatic artery. No evidence of active extravasation or significant collateralization through the  pancreaticoduodenal arcade. The C2 cobra catheter was next advanced into the celiac artery. An arteriogram was performed. The gastroduodenal artery is elongated and slightly irregular in the mid segment likely secondary to external mass effect. There is no evidence of active extravasation. A renegade ST micro catheter was advanced coaxially over a fat and 16 wire. The micro catheter was advanced into the gastroduodenal artery. Arteriography was again performed. There are multiple branches arising from the gastroduodenal artery providing supply to a hypervascular mass in the region the descending duodenum. The micro catheter was carefully advanced more distally into the proximal right gastroepiploic artery. Contrast injection was performed confirming the location of the catheter tip as well as confirming no additional supplied to the region of tumor. Coil embolization was then performed of the gastroduodenal artery using a combination of vortex, standard helical and soft interlock detachable micro coils ranging in size from 2-6 mm. Post embolization arteriography confirms successful embolization of the vessel with no further visible hypervascular blush of the duodenum tumor. The micro catheter was removed. Repeat celiac artery injection was performed confirming no additional collateral supply. The 5 French catheter was readvanced of the superior mesenteric artery and an additional angiogram was performed again confirming no collateral flow to the embolized territory. The catheters were removed. The vascular sheath was left in place for hemodynamic monitoring. Ultrasound was again used to interrogate the left groin. The left common femoral vein was identified. Local anesthesia was attained by infiltration with 1% lidocaine. The left common femoral vein was punctured with an 18 gauge needle. A wire was advanced into the inferior vena cava. The skin tract was dilated and an arrow triple-lumen central venous catheter  advanced over the wire and position with the tip at the confluence of the IVC and left common iliac vein. The catheter was flushed, capped and secured with 0 Prolene suture. Sterile bandages were applied. COMPLICATIONS: None IMPRESSION: 1. No evidence of active extravasation. 2. 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 3. Coil embolization of the gastroduodenal artery. 4. Placement of a left femoral triple-lumen central venous catheter. The catheter tip is at the confluence of the IVC and iliac vein and ready for immediate use. Signed, Criselda Peaches, MD [facility] Radiology Electronically Signed   By: Jacqulynn Cadet M.D.   On: 01/23/2015 15:50   Ir Angiogram Visceral Selective  01/23/2015  CLINICAL DATA:  65 year old male presents with acute life-threatening upper GI bleed secondary to an ulcerated mass encompassing the distal stomach and duodenum. 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 Endoscopic treatment options are limited and surgery is extremely high risk. Patient presents for emergent visceral arteriography and embolization. Additionally, central venous access is required.  A triple-lumen central venous catheter will be placed during the course of the procedure. EXAM: SELECTIVE VISCERAL ARTERIOGRAPHY; IR EMBO ART VEN HEMORR LYMPH EXTRAV INC GUIDE ROADMAPPING; IR LEFT FLOURO GUIDE CV LINE; ADDITIONAL ARTERIOGRAPHY; IR ULTRASOUND GUIDANCE VASC ACCESS LEFT; ARTERIOGRAPHY Date: 01/23/2015 PROCEDURE: 1. Ultrasound-guided puncture left common femoral artery 2. Catheterization of the superior mesenteric artery with arteriogram 3. Catheterization of the celiac artery with arteriogram 4. Catheterization of the gastroduodenal artery with arteriogram 5. Catheterization of the right  gastroepiploic artery with arteriogram 6. Coil embolization of the gastroduodenal artery 7. Post embolization arteriogram 8. Limited left common femoral arteriogram 9. Ultrasound-guided puncture of the left common femoral vein 10. Placement of a triple-lumen central venous catheter via the left femoral vein with fluoroscopic guidance Interventional Radiologist:  Sterling Big, MD ANESTHESIA/SEDATION: Moderate (conscious) sedation was used. 1 mg Versed, 50 mcg Fentanyl were administered intravenously. The patient's vital signs were monitored continuously by radiology nursing throughout the procedure. Sedation Time: 60 minutes MEDICATIONS: None additional FLUOROSCOPY TIME:  16 minutes for a total of 622 mGy CONTRAST:  OMNIPAQUE IOHEXOL 300 MG/ML  SOLN TECHNIQUE: Informed consent was obtained from the patient following explanation of the procedure, risks, benefits and alternatives. The patient understands, agrees and consents for the procedure. All questions were addressed. A time out was performed. Maximal barrier sterile technique utilized including caps, mask, sterile gowns, sterile gloves, large sterile drape, hand hygiene, and Betadine skin prep. The left groin was interrogated with ultrasound. The common femoral artery is found to be widely patent. An image was obtained and stored for the medical record. Local anesthesia was attained by infiltration with 1% lidocaine. Under real-time sonographic guidance, the vessel was punctured with a 21 gauge micropuncture needle. Using standard technique, the initial micro wire was exchanged through a transitional 5 [place] micro sheath for a working 0.035 inch Bentson wire. The micro sheath was then exchanged for a working 5 [place] vascular sheath. A C2 cobra catheter was advanced into the abdominal aorta over the wire. The catheter was initially advanced into the superior mesenteric artery and superior mesenteric arteriogram was performed. There is conventional  anatomy. No replaced or accessory right hepatic artery. No evidence of active extravasation or significant collateralization through the pancreaticoduodenal arcade. The C2 cobra catheter was next advanced into the celiac artery. An arteriogram was performed. The gastroduodenal artery is elongated and slightly irregular in the mid segment likely secondary to external mass effect. There is no evidence of active extravasation. A renegade ST micro catheter was advanced coaxially over a fat and 16 wire. The micro catheter was advanced into the gastroduodenal artery. Arteriography was again performed. There are multiple branches arising from the gastroduodenal artery providing supply to a hypervascular mass in the region the descending duodenum. The micro catheter was carefully advanced more distally into the proximal right gastroepiploic artery. Contrast injection was performed confirming the location of the catheter tip as well as confirming no additional supplied to the region of tumor. Coil embolization was then performed of the gastroduodenal artery using a combination of vortex, standard helical and soft interlock detachable micro coils ranging in size from 2-6 mm. Post embolization arteriography confirms successful embolization of the vessel with no further visible hypervascular blush of the duodenum tumor. The micro catheter was removed. Repeat celiac artery injection was performed confirming no additional collateral supply. The 5 French catheter was readvanced of the superior mesenteric artery and an additional angiogram was performed again confirming no collateral flow to the embolized  territory. The catheters were removed. The vascular sheath was left in place for hemodynamic monitoring. Ultrasound was again used to interrogate the left groin. The left common femoral vein was identified. Local anesthesia was attained by infiltration with 1% lidocaine. The left common femoral vein was punctured with an 18 gauge  needle. A wire was advanced into the inferior vena cava. The skin tract was dilated and an arrow triple-lumen central venous catheter advanced over the wire and position with the tip at the confluence of the IVC and left common iliac vein. The catheter was flushed, capped and secured with 0 Prolene suture. Sterile bandages were applied. COMPLICATIONS: None IMPRESSION: 1. No evidence of active extravasation. 2. 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 3. Coil embolization of the gastroduodenal artery. 4. Placement of a left femoral triple-lumen central venous catheter. The catheter tip is at the confluence of the IVC and iliac vein and ready for immediate use. Signed, Criselda Peaches, MD [facility] Radiology Electronically Signed   By: Jacqulynn Cadet M.D.   On: 01/23/2015 15:50   Ir Angiogram Selective Each Additional Vessel  01/23/2015  CLINICAL DATA:  65 year old male presents with acute life-threatening upper GI bleed secondary to an ulcerated mass encompassing the distal stomach and duodenum. 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 Endoscopic treatment options are limited and surgery is extremely high risk. Patient presents for emergent visceral arteriography and embolization. Additionally, central venous access is required. A triple-lumen central venous catheter will be placed during the course of the procedure. EXAM: SELECTIVE VISCERAL ARTERIOGRAPHY; IR EMBO ART VEN HEMORR LYMPH EXTRAV INC GUIDE ROADMAPPING; IR LEFT FLOURO GUIDE CV LINE; ADDITIONAL ARTERIOGRAPHY; IR ULTRASOUND GUIDANCE VASC ACCESS LEFT; ARTERIOGRAPHY Date: 01/23/2015 PROCEDURE: 1. Ultrasound-guided puncture left common femoral artery 2. Catheterization of the superior mesenteric artery with arteriogram 3.  Catheterization of the celiac artery with arteriogram 4. Catheterization of the gastroduodenal artery with arteriogram 5. Catheterization of the right gastroepiploic artery with arteriogram 6. Coil embolization of the gastroduodenal artery 7. Post embolization arteriogram 8. Limited left common femoral arteriogram 9. Ultrasound-guided puncture of the left common femoral vein 10. Placement of a triple-lumen central venous catheter via the left femoral vein with fluoroscopic guidance Interventional Radiologist:  Criselda Peaches, MD ANESTHESIA/SEDATION: Moderate (conscious) sedation was used. 1 mg Versed, 50 mcg Fentanyl were administered intravenously. The patient's vital signs were monitored continuously by radiology nursing throughout the procedure. Sedation Time: 60 minutes MEDICATIONS: None additional FLUOROSCOPY TIME:  16 minutes for a total of 622 mGy CONTRAST:  144m OMNIPAQUE IOHEXOL 300 MG/ML  SOLN TECHNIQUE: Informed consent was obtained from the patient following explanation of the procedure, risks, benefits and alternatives. The patient understands, agrees and consents for the procedure. All questions were addressed. A time out was performed. Maximal barrier sterile technique utilized including caps, mask, sterile gowns, sterile gloves, large sterile drape, hand hygiene, and Betadine skin prep. The left groin was interrogated with ultrasound. The common femoral artery is found to be widely patent. An image was obtained and stored for the medical record. Local anesthesia was attained by infiltration with 1% lidocaine. Under real-time sonographic guidance, the vessel was punctured with a 21 gauge micropuncture needle. Using standard technique, the initial micro wire was exchanged through a transitional 5 [place] sheath for a working 0.035 inch Bentson wire. The micro sheath was then exchanged  for a working 5 [place] vascular sheath. A C2 cobra catheter was advanced into the abdominal aorta over the  wire. The catheter was initially advanced into the superior mesenteric artery and superior mesenteric arteriogram was performed. There is conventional anatomy. No replaced or accessory right hepatic artery. No evidence of active extravasation or significant collateralization through the pancreaticoduodenal arcade. The C2 cobra catheter was next advanced into the celiac artery. An arteriogram was performed. The gastroduodenal artery is elongated and slightly irregular in the mid segment likely secondary to external mass effect. There is no evidence of active extravasation. A renegade ST micro catheter was advanced coaxially over a fat and 16 wire. The micro catheter was advanced into the gastroduodenal artery. Arteriography was again performed. There are multiple branches arising from the gastroduodenal artery providing supply to a hypervascular mass in the region the descending duodenum. The micro catheter was carefully advanced more distally into the proximal right gastroepiploic artery. Contrast injection was performed confirming the location of the catheter tip as well as confirming no additional supplied to the region of tumor. Coil embolization was then performed of the gastroduodenal artery using a combination of vortex, standard helical and soft interlock detachable micro coils ranging in size from 2-6 mm. Post embolization arteriography confirms successful embolization of the vessel with no further visible hypervascular blush of the duodenum tumor. The micro catheter was removed. Repeat celiac artery injection was performed confirming no additional collateral supply. The 5 French catheter was readvanced of the superior mesenteric artery and an additional angiogram was performed again confirming no collateral flow to the embolized territory. The catheters were removed. The vascular sheath was left in place for hemodynamic monitoring. Ultrasound was again used to interrogate the left groin. The left common  femoral vein was identified. Local anesthesia was attained by infiltration with 1% lidocaine. The left common femoral vein was punctured with an 18 gauge needle. A wire was advanced into the inferior vena cava. The skin tract was dilated and an arrow triple-lumen central venous catheter advanced over the wire and position with the tip at the confluence of the IVC and left common iliac vein. The catheter was flushed, capped and secured with 0 Prolene suture. Sterile bandages were applied. COMPLICATIONS: None IMPRESSION: 1. No evidence of active extravasation. 2. 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 3. Coil embolization of the gastroduodenal artery. 4. Placement of a left femoral triple-lumen central venous catheter. The catheter tip is at the confluence of the IVC and iliac vein and ready for immediate use. Signed, Criselda Peaches, MD [facility] Radiology Electronically Signed   By: Jacqulynn Cadet M.D.   On: 01/23/2015 15:50   Ir Angiogram Follow Up Study  01/23/2015  CLINICAL DATA:  65 year old male presents with acute life-threatening upper GI bleed secondary to an ulcerated mass encompassing the distal stomach and duodenum. 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 Endoscopic treatment options are limited and surgery is extremely high risk. Patient presents for emergent visceral arteriography and embolization. Additionally, central venous access is required. A triple-lumen central venous catheter will be placed during the course of the procedure. EXAM: SELECTIVE VISCERAL ARTERIOGRAPHY; IR EMBO ART VEN HEMORR LYMPH EXTRAV INC GUIDE ROADMAPPING; IR LEFT FLOURO GUIDE CV LINE; ADDITIONAL ARTERIOGRAPHY; IR ULTRASOUND GUIDANCE VASC ACCESS LEFT; ARTERIOGRAPHY Date: 01/23/2015 PROCEDURE:  1. Ultrasound-guided puncture left common femoral artery 2.  Catheterization of the superior mesenteric artery with arteriogram 3. Catheterization of the celiac artery with arteriogram 4. Catheterization of the gastroduodenal artery with arteriogram 5. Catheterization of the right gastroepiploic artery with arteriogram 6. Coil embolization of the gastroduodenal artery 7. Post embolization arteriogram 8. Limited left common femoral arteriogram 9. Ultrasound-guided puncture of the left common femoral vein 10. Placement of a triple-lumen central venous catheter via the left femoral vein with fluoroscopic guidance Interventional Radiologist:  Criselda Peaches, MD ANESTHESIA/SEDATION: Moderate (conscious) sedation was used. 1 mg Versed, 50 mcg Fentanyl were administered intravenously. The patient's vital signs were monitored continuously by radiology nursing throughout the procedure. Sedation Time: 60 minutes MEDICATIONS: None additional FLUOROSCOPY TIME:  16 minutes for a total of 622 mGy CONTRAST:  168m OMNIPAQUE IOHEXOL 300 MG/ML  SOLN TECHNIQUE: Informed consent was obtained from the patient following explanation of the procedure, risks, benefits and alternatives. The patient understands, agrees and consents for the procedure. All questions were addressed. A time out was performed. Maximal barrier sterile technique utilized including caps, mask, sterile gowns, sterile gloves, large sterile drape, hand hygiene, and Betadine skin prep. The left groin was interrogated with ultrasound. The common femoral artery is found to be widely patent. An image was obtained and stored for the medical record. Local anesthesia was attained by infiltration with 1% lidocaine. Under real-time sonographic guidance, the vessel was punctured with a 21 gauge micropuncture needle. Using standard technique, the initial micro wire was exchanged through a transitional 5 [place] sheath for a working 0.035 inch Bentson wire. The micro  sheath was then exchanged for a working 5 [place] sheath. A C2 cobra catheter was advanced into the abdominal aorta over the wire. The catheter was initially advanced into the superior mesenteric artery and superior mesenteric arteriogram was performed. There is conventional anatomy. No replaced or accessory right hepatic artery. No evidence of active extravasation or significant collateralization through the pancreaticoduodenal arcade. The C2 cobra catheter was next advanced into the celiac artery. An arteriogram was performed. The gastroduodenal artery is elongated and slightly irregular in the mid segment likely secondary to external mass effect. There is no evidence of active extravasation. A renegade ST micro catheter was advanced coaxially over a fat and 16 wire. The micro catheter was advanced into the gastroduodenal artery. Arteriography was again performed. There are multiple branches arising from the gastroduodenal artery providing supply to a hypervascular mass in the region the descending duodenum. The micro catheter was carefully advanced more distally into the proximal right gastroepiploic artery. Contrast injection was performed confirming the location of the catheter tip as well as confirming no additional supplied to the region of tumor. Coil embolization was then performed of the gastroduodenal artery using a combination of vortex, standard helical and soft interlock detachable micro coils ranging in size from 2-6 mm. Post embolization arteriography confirms successful embolization of the vessel with no further visible hypervascular blush of the duodenum tumor. The micro catheter was removed. Repeat celiac artery injection was performed confirming no additional collateral supply. The 5 French catheter was readvanced of the superior mesenteric artery and an additional angiogram was performed again confirming no collateral flow to the embolized territory. The catheters were removed. The  vascular sheath was left in place for hemodynamic monitoring. Ultrasound was again used to interrogate the left groin. The left common femoral vein was identified. Local anesthesia was attained by infiltration with 1% lidocaine. The left common femoral vein was punctured with an 18 gauge needle. A wire  was advanced into the inferior vena cava. The skin tract was dilated and an arrow triple-lumen central venous catheter advanced over the wire and position with the tip at the confluence of the IVC and left common iliac vein. The catheter was flushed, capped and secured with 0 Prolene suture. Sterile bandages were applied. COMPLICATIONS: None IMPRESSION: 1. No evidence of active extravasation. 2. 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 3. Coil embolization of the gastroduodenal artery. 4. Placement of a left femoral triple-lumen central venous catheter. The catheter tip is at the confluence of the IVC and iliac vein and ready for immediate use. Signed, Criselda Peaches, MD [facility] Radiology Electronically Signed   By: Jacqulynn Cadet M.D.   On: 01/23/2015 15:50   Ir Fluoro Guide Cv Line Left  01/23/2015  CLINICAL DATA:  65 year old male presents with acute life-threatening upper GI bleed secondary to an ulcerated mass encompassing the distal stomach and duodenum. 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 Endoscopic treatment options are limited and surgery is extremely high risk. Patient presents for emergent visceral arteriography and embolization. Additionally, central venous access is required. A triple-lumen central venous catheter will be placed during the course of the procedure. EXAM: SELECTIVE VISCERAL ARTERIOGRAPHY; IR EMBO ART VEN HEMORR LYMPH EXTRAV INC GUIDE ROADMAPPING; IR  LEFT FLOURO GUIDE CV LINE; ADDITIONAL ARTERIOGRAPHY; IR ULTRASOUND GUIDANCE VASC ACCESS LEFT; ARTERIOGRAPHY Date: 01/23/2015 PROCEDURE: 1. Ultrasound-guided puncture left common femoral artery 2. Catheterization of the superior mesenteric artery with arteriogram 3. Catheterization of the celiac artery with arteriogram 4. Catheterization of the gastroduodenal artery with arteriogram 5. Catheterization of the right gastroepiploic artery with arteriogram 6. Coil embolization of the gastroduodenal artery 7. Post embolization arteriogram 8. Limited left common femoral arteriogram 9. Ultrasound-guided puncture of the left common femoral vein 10. Placement of a triple-lumen central venous catheter via the left femoral vein with fluoroscopic guidance Interventional Radiologist:  Criselda Peaches, MD ANESTHESIA/SEDATION: Moderate (conscious) sedation was used. 1 mg Versed, 50 mcg Fentanyl were administered intravenously. The patient's vital signs were monitored continuously by radiology nursing throughout the procedure. Sedation Time: 60 minutes MEDICATIONS: None additional FLUOROSCOPY TIME:  16 minutes for a total of 622 mGy CONTRAST:  158m OMNIPAQUE IOHEXOL 300 MG/ML  SOLN TECHNIQUE: Informed consent was obtained from the patient following explanation of the procedure, risks, benefits and alternatives. The patient understands, agrees and consents for the procedure. All questions were addressed. A time out was performed. Maximal barrier sterile technique utilized including caps, mask, sterile gowns, sterile gloves, large sterile drape, hand hygiene, and Betadine skin prep. The left groin was interrogated with ultrasound. The common femoral artery is found to be widely patent. An image was obtained and stored for the medical record. Local anesthesia was attained by infiltration with 1% lidocaine. Under real-time sonographic guidance, the vessel was punctured with a 21 gauge micropuncture needle. Using standard technique,  the initial micro wire was exchanged through a transitional 5 [place] sheath for a working 0.035 inch Bentson wire. The micro sheath was then exchanged for a working 5 [place] sheath. A C2 cobra catheter was advanced into the abdominal aorta over the wire. The catheter was initially advanced into the superior mesenteric artery and superior mesenteric arteriogram was performed. There is conventional anatomy. No replaced or accessory right hepatic artery. No evidence of active extravasation or significant  collateralization through the pancreaticoduodenal arcade. The C2 cobra catheter was next advanced into the celiac artery. An arteriogram was performed. The gastroduodenal artery is elongated and slightly irregular in the mid segment likely secondary to external mass effect. There is no evidence of active extravasation. A renegade ST micro catheter was advanced coaxially over a fat and 16 wire. The micro catheter was advanced into the gastroduodenal artery. Arteriography was again performed. There are multiple branches arising from the gastroduodenal artery providing supply to a hypervascular mass in the region the descending duodenum. The micro catheter was carefully advanced more distally into the proximal right gastroepiploic artery. Contrast injection was performed confirming the location of the catheter tip as well as confirming no additional supplied to the region of tumor. Coil embolization was then performed of the gastroduodenal artery using a combination of vortex, standard helical and soft interlock detachable micro coils ranging in size from 2-6 mm. Post embolization arteriography confirms successful embolization of the vessel with no further visible hypervascular blush of the duodenum tumor. The micro catheter was removed. Repeat celiac artery injection was performed confirming no additional collateral supply. The 5 French catheter was readvanced of the superior mesenteric artery and an  additional angiogram was performed again confirming no collateral flow to the embolized territory. The catheters were removed. The vascular sheath was left in place for hemodynamic monitoring. Ultrasound was again used to interrogate the left groin. The left common femoral vein was identified. Local anesthesia was attained by infiltration with 1% lidocaine. The left common femoral vein was punctured with an 18 gauge needle. A wire was advanced into the inferior vena cava. The skin tract was dilated and an arrow triple-lumen central venous catheter advanced over the wire and position with the tip at the confluence of the IVC and left common iliac vein. The catheter was flushed, capped and secured with 0 Prolene suture. Sterile bandages were applied. COMPLICATIONS: None IMPRESSION: 1. No evidence of active extravasation. 2. 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 3. Coil embolization of the gastroduodenal artery. 4. Placement of a left femoral triple-lumen central venous catheter. The catheter tip is at the confluence of the IVC and iliac vein and ready for immediate use. Signed, Criselda Peaches, MD [facility] Radiology Electronically Signed   By: Jacqulynn Cadet M.D.   On: 01/23/2015 15:50   Ir Fluoro Guide Cv Line Right  01/26/2015  CLINICAL DATA:  MANTLE CELL LYMPHOMA, ACCESS FOR CHEMOTHERAPY EXAM: RIGHT INTERNAL JUGULAR SINGLE LUMEN POWER PORT CATHETER INSERTION Date:  12/23/201612/23/2016 11:02 am Radiologist:  M. Daryll Brod, MD Guidance:  Ultrasound and fluoroscopic FLUOROSCOPY TIME:  48 seconds, for mGy MEDICATIONS AND MEDICAL HISTORY: 2 g AncefAdministered within 1 hour of the procedure.2 mg Versed, 100 mcg fentanyl ANESTHESIA/SEDATION: 30 minutes CONTRAST:  None COMPLICATIONS: None immediate PROCEDURE: Informed consent was obtained from the patient following  explanation of the procedure, risks, benefits and alternatives. The patient understands, agrees and consents for the procedure. All questions were addressed. A time out was performed. Maximal barrier sterile technique utilized including caps, mask, sterile gowns, sterile gloves, large sterile drape, hand hygiene, and 2% chlorhexidine scrub. Under sterile conditions and local anesthesia, right internal jugular micropuncture venous access was performed. Access was performed with ultrasound. Images were obtained for documentation. A guide wire was inserted followed by a transitional dilator. This allowed insertion of a guide wire and catheter into the IVC. Measurements  were obtained from the SVC / RA junction back to the right IJ venotomy site. In the right infraclavicular chest, a subcutaneous pocket was created over the second anterior rib. This was done under sterile conditions and local anesthesia. 1% lidocaine with epinephrine was utilized for this. A 2.5 cm incision was made in the skin. Blunt dissection was performed to create a subcutaneous pocket over the right pectoralis major muscle. The pocket was flushed with saline vigorously. There was adequate hemostasis. The port catheter was assembled and checked for leakage. The port catheter was secured in the pocket with two retention sutures. The tubing was tunneled subcutaneously to the right venotomy site and inserted into the SVC/RA junction through a valved peel-away sheath. Position was confirmed with fluoroscopy. Images were obtained for documentation. The patient tolerated the procedure well. No immediate complications. Incisions were closed in a two layer fashion with 4 - 0 Vicryl suture. Dermabond was applied to the skin. The port catheter was accessed, blood was aspirated followed by saline and heparin flushes. Needle was removed. A dry sterile dressing was applied. IMPRESSION: Ultrasound and fluoroscopically guided right internal jugular single lumen  power port catheter insertion. Tip in the SVC/RA junction. Catheter ready for use. Electronically Signed   By: Jerilynn Mages.  Shick M.D.   On: 01/26/2015 11:25   Ir US Guide Vasc Access Left  01/23/2015  CLINICAL DATA:  65 year old male presents with acute life-threatening upper GI bleed secondary to an ulcerated mass encompassing the distal stomach and duodenum. 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 Endoscopic treatment options are limited and surgery is extremely high risk. Patient presents for emergent visceral arteriography and embolization. Additionally, central venous access is required. A triple-lumen central venous catheter will be placed during the course of the procedure. EXAM: SELECTIVE VISCERAL ARTERIOGRAPHY; IR EMBO ART VEN HEMORR LYMPH EXTRAV INC GUIDE ROADMAPPING; IR LEFT FLOURO GUIDE CV LINE; ADDITIONAL ARTERIOGRAPHY; IR ULTRASOUND GUIDANCE VASC ACCESS LEFT; ARTERIOGRAPHY Date: 01/23/2015 PROCEDURE: 1. Ultrasound-guided puncture left common femoral artery 2. Catheterization of the superior mesenteric artery with arteriogram 3. Catheterization of the celiac artery with arteriogram 4. Catheterization of the gastroduodenal artery with arteriogram 5. Catheterization of the right gastroepiploic artery with arteriogram 6. Coil embolization of the gastroduodenal artery 7. Post embolization arteriogram 8. Limited left common femoral arteriogram 9. Ultrasound-guided puncture of the left common femoral vein 10. Placement of a triple-lumen central venous catheter via the left femoral vein with fluoroscopic guidance Interventional Radiologist:  Criselda Peaches, MD ANESTHESIA/SEDATION: Moderate (conscious) sedation was used. 1 mg Versed, 50 mcg Fentanyl were administered intravenously. The patient's vital signs were monitored continuously by radiology nursing throughout the procedure. Sedation Time: 60 minutes MEDICATIONS: None  additional FLUOROSCOPY TIME:  16 minutes for a total of 622 mGy CONTRAST:  157m OMNIPAQUE IOHEXOL 300 MG/ML  SOLN TECHNIQUE: Informed consent was obtained from the patient following explanation of the procedure, risks, benefits and alternatives. The patient understands, agrees and consents for the procedure. All questions were addressed. A time out was performed. Maximal barrier sterile technique utilized including caps, mask, sterile gowns, sterile gloves, large sterile drape, hand hygiene, and Betadine skin prep. The left groin was interrogated with ultrasound. The common femoral artery is found to be widely patent. An image was obtained and stored for the medical record. Local anesthesia was attained by infiltration with 1% lidocaine. Under real-time sonographic guidance, the vessel was punctured with a 21 gauge micropuncture needle. Using standard technique,  the initial micro wire was exchanged through a transitional 5 [place] micro sheath for a working 0.035 inch Bentson wire. The micro sheath was then exchanged for a working 5 [place] vascular sheath. A C2 cobra catheter was advanced into the abdominal aorta over the wire. The catheter was initially advanced into the superior mesenteric artery and superior mesenteric arteriogram was performed. There is conventional anatomy. No replaced or accessory right hepatic artery. No evidence of active extravasation or significant collateralization through the pancreaticoduodenal arcade. The C2 cobra catheter was next advanced into the celiac artery. An arteriogram was performed. The gastroduodenal artery is elongated and slightly irregular in the mid segment likely secondary to external mass effect. There is no evidence of active extravasation. A renegade ST micro catheter was advanced coaxially over a fat and 16 wire. The micro catheter was advanced into the gastroduodenal artery. Arteriography was again performed. There are multiple branches arising from the  gastroduodenal artery providing supply to a hypervascular mass in the region the descending duodenum. The micro catheter was carefully advanced more distally into the proximal right gastroepiploic artery. Contrast injection was performed confirming the location of the catheter tip as well as confirming no additional supplied to the region of tumor. Coil embolization was then performed of the gastroduodenal artery using a combination of vortex, standard helical and soft interlock detachable micro coils ranging in size from 2-6 mm. Post embolization arteriography confirms successful embolization of the vessel with no further visible hypervascular blush of the duodenum tumor. The micro catheter was removed. Repeat celiac artery injection was performed confirming no additional collateral supply. The 5 French catheter was readvanced of the superior mesenteric artery and an additional angiogram was performed again confirming no collateral flow to the embolized territory. The catheters were removed. The vascular sheath was left in place for hemodynamic monitoring. Ultrasound was again used to interrogate the left groin. The left common femoral vein was identified. Local anesthesia was attained by infiltration with 1% lidocaine. The left common femoral vein was punctured with an 18 gauge needle. A wire was advanced into the inferior vena cava. The skin tract was dilated and an arrow triple-lumen central venous catheter advanced over the wire and position with the tip at the confluence of the IVC and left common iliac vein. The catheter was flushed, capped and secured with 0 Prolene suture. Sterile bandages were applied. COMPLICATIONS: None IMPRESSION: 1. No evidence of active extravasation. 2. 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 3. Coil embolization of the gastroduodenal artery. 4. Placement of a left femoral triple-lumen central  venous catheter. The catheter tip is at the confluence of the IVC and iliac vein and ready for immediate use. Signed, Criselda Peaches, MD [facility] Radiology Electronically Signed   By: Jacqulynn Cadet M.D.   On: 01/23/2015 15:50   Ir US Guide Vasc Access Left  01/23/2015  CLINICAL DATA:  65 year old male presents with acute life-threatening upper GI bleed secondary to an ulcerated mass encompassing the distal stomach and duodenum. 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 Endoscopic treatment options are limited and surgery is extremely high risk. Patient presents for emergent visceral arteriography and embolization. Additionally, central venous access is required. A triple-lumen central venous catheter will be placed during the course of the procedure. EXAM: SELECTIVE VISCERAL ARTERIOGRAPHY; IR EMBO ART VEN HEMORR LYMPH EXTRAV INC GUIDE ROADMAPPING;  IR LEFT FLOURO GUIDE CV LINE; ADDITIONAL ARTERIOGRAPHY; IR ULTRASOUND GUIDANCE VASC ACCESS LEFT; ARTERIOGRAPHY Date: 01/23/2015 PROCEDURE: 1. Ultrasound-guided puncture left common femoral artery 2. Catheterization of the superior mesenteric artery with arteriogram 3. Catheterization of the celiac artery with arteriogram 4. Catheterization of the gastroduodenal artery with arteriogram 5. Catheterization of the right gastroepiploic artery with arteriogram 6. Coil embolization of the gastroduodenal artery 7. Post embolization arteriogram 8. Limited left common femoral arteriogram 9. Ultrasound-guided puncture of the left common femoral vein 10. Placement of a triple-lumen central venous catheter via the left femoral vein with fluoroscopic guidance Interventional Radiologist:  Criselda Peaches, MD ANESTHESIA/SEDATION: Moderate (conscious) sedation was used. 1 mg Versed, 50 mcg Fentanyl were administered intravenously. The  patient's vital signs were monitored continuously by radiology nursing throughout the procedure. Sedation Time: 60 minutes MEDICATIONS: None additional FLUOROSCOPY TIME:  16 minutes for a total of 622 mGy CONTRAST:  130m OMNIPAQUE IOHEXOL 300 MG/ML  SOLN TECHNIQUE: Informed consent was obtained from the patient following explanation of the procedure, risks, benefits and alternatives. The patient understands, agrees and consents for the procedure. All questions were addressed. A time out was performed. Maximal barrier sterile technique utilized including caps, mask, sterile gowns, sterile gloves, large sterile drape, hand hygiene, and Betadine skin prep. The left groin was interrogated with ultrasound. The common femoral artery is found to be widely patent. An image was obtained and stored for the medical record. Local anesthesia was attained by infiltration with 1% lidocaine. Under real-time sonographic guidance, the vessel was punctured with a 21 gauge micropuncture needle. Using standard technique, the initial micro wire was exchanged through a transitional 5 [place] sheath for a working 0.035 inch Bentson wire. The micro sheath was then exchanged for a working 5 [place] sheath. A C2 cobra catheter was advanced into the abdominal aorta over the wire. The catheter was initially advanced into the superior mesenteric artery and superior mesenteric arteriogram was performed. There is conventional anatomy. No replaced or accessory right hepatic artery. No evidence of active extravasation or significant collateralization through the pancreaticoduodenal arcade. The C2 cobra catheter was next advanced into the celiac artery. An arteriogram was performed. The gastroduodenal artery is elongated and slightly irregular in the mid segment likely secondary to external mass effect. There is no evidence of active extravasation. A renegade ST micro catheter was advanced coaxially over a fat and 16 wire. The micro  catheter was advanced into the gastroduodenal artery. Arteriography was again performed. There are multiple branches arising from the gastroduodenal artery providing supply to a hypervascular mass in the region the descending duodenum. The micro catheter was carefully advanced more distally into the proximal right gastroepiploic artery. Contrast injection was performed confirming the location of the catheter tip as well as confirming no additional supplied to the region of tumor. Coil embolization was then performed of the gastroduodenal artery using a combination of vortex, standard helical and soft interlock detachable micro coils ranging in size from 2-6 mm. Post embolization arteriography confirms successful embolization of the vessel with no further visible hypervascular blush of the duodenum tumor. The micro catheter was removed. Repeat celiac artery injection was performed confirming no additional collateral supply. The 5 French catheter was readvanced of the superior mesenteric artery and an additional angiogram was performed again confirming no collateral flow to the embolized territory. The catheters were removed. The vascular sheath was left in place for hemodynamic monitoring. Ultrasound was again used to interrogate the left groin. The left common femoral  vein was identified. Local anesthesia was attained by infiltration with 1% lidocaine. The left common femoral vein was punctured with an 18 gauge needle. A wire was advanced into the inferior vena cava. The skin tract was dilated and an arrow triple-lumen central venous catheter advanced over the wire and position with the tip at the confluence of the IVC and left common iliac vein. The catheter was flushed, capped and secured with 0 Prolene suture. Sterile bandages were applied. COMPLICATIONS: None IMPRESSION: 1. No evidence of active extravasation. 2. Positive hypervascular tumor blush in the region of the descending duodenum with arterial supply  from the gastroduodenal artery. 3. Coil embolization of the gastroduodenal artery. 4. Placement of a left femoral triple-lumen central venous catheter. The catheter tip is at the confluence of the IVC and iliac vein and ready for immediate use. Signed, Criselda Peaches, MD [facility] Radiology Electronically Signed   By: Jacqulynn Cadet M.D.   On: 01/23/2015 15:50   Ir US Guide Vasc Access Right  01/26/2015  CLINICAL DATA:  MANTLE CELL LYMPHOMA, ACCESS FOR CHEMOTHERAPY EXAM: RIGHT INTERNAL JUGULAR SINGLE LUMEN POWER PORT CATHETER INSERTION Date:  12/23/201612/23/2016 11:02 am Radiologist:  M. Daryll Brod, MD Guidance:  Ultrasound and fluoroscopic FLUOROSCOPY TIME:  48 seconds, for mGy MEDICATIONS AND MEDICAL HISTORY: 2 g AncefAdministered within 1 hour of the procedure.2 mg Versed, 100 mcg fentanyl ANESTHESIA/SEDATION: 30 minutes CONTRAST:  None COMPLICATIONS: None immediate PROCEDURE: Informed consent was obtained from the patient following explanation of the procedure, risks, benefits and alternatives. The patient understands, agrees and consents for the procedure. All questions were addressed. A time out was performed. Maximal barrier sterile technique utilized including caps, mask, sterile gowns, sterile gloves, large sterile drape, hand hygiene, and 2% chlorhexidine scrub. Under sterile conditions and local anesthesia, right internal jugular micropuncture venous access was performed. Access was performed with ultrasound. Images were obtained for documentation. A guide wire was inserted followed by a transitional dilator. This allowed insertion of a guide wire and catheter into the IVC. Measurements were obtained from the SVC / RA junction back to the right IJ venotomy site. In the right infraclavicular chest, a subcutaneous pocket was created over the second anterior rib. This was done under sterile conditions and local anesthesia. 1% lidocaine with  epinephrine was utilized for this. A 2.5 cm incision was made in the skin. Blunt dissection was performed to create a subcutaneous pocket over the right pectoralis major muscle. The pocket was flushed with saline vigorously. There was adequate hemostasis. The port catheter was assembled and checked for leakage. The port catheter was secured in the pocket with two retention sutures. The tubing was tunneled subcutaneously to the right venotomy site and inserted into the SVC/RA junction through a valved peel-away sheath. Position was confirmed with fluoroscopy. Images were obtained for documentation. The patient tolerated the procedure well. No immediate complications. Incisions were closed in a two layer fashion with 4 - 0 Vicryl suture. Dermabond was applied to the skin. The port catheter was accessed, blood was aspirated followed by saline and heparin flushes. Needle was removed. A dry sterile dressing was applied. IMPRESSION: Ultrasound and fluoroscopically guided right internal jugular single lumen power port catheter insertion. Tip in the SVC/RA junction. Catheter ready for use. Electronically Signed   By: Jerilynn Mages.  Shick M.D.   On: 01/26/2015 11:25   Ct Biopsy  02/06/2015  INDICATION: Lymphoma. Please perform CT-guided bone marrow biopsy for tissue diagnostic purposes. EXAM: CT-GUIDED BONE MARROW  BIOPSY AND ASPIRATION MEDICATIONS: Fentanyl 75 mcg IV; Versed 1.5 mg IV ANESTHESIA/SEDATION: Sedation Time 5 minutes CONTRAST:  None COMPLICATIONS: None immediate. PROCEDURE: Informed consent was obtained from the patient following an explanation of the procedure, risks, benefits and alternatives. The patient understands, agrees and consents for the procedure. All questions were addressed. A time out was performed prior to the initiation of the procedure. The patient was positioned prone and non-contrast localization CT was performed of the pelvis to demonstrate the iliac marrow spaces. The operative site was prepped and  draped in the usual sterile fashion. Under sterile conditions and local anesthesia, a 22 gauge spinal needle was utilized for procedural planning. Next, an 11 gauge coaxial bone biopsy needle was advanced into the right iliac marrow space. Needle position was confirmed with CT imaging. Initially, bone marrow aspiration was performed. Next, a bone marrow biopsy was obtained with the 11 gauge outer bone marrow device. Samples were prepared with the cytotechnologist and deemed adequate. The needle was removed intact. Hemostasis was obtained with compression and a dressing was placed. The patient tolerated the procedure well without immediate post procedural complication. IMPRESSION: Successful CT guided right iliac bone marrow aspiration and core biopsy. Electronically Signed   By: Sandi Mariscal M.D.   On: 02/06/2015 10:18   Portable Chest Xray  01/24/2015  CLINICAL DATA:  Respiratory failure. EXAM: PORTABLE CHEST 1 VIEW COMPARISON:  01/23/2015 FINDINGS: Endotracheal tube and NG tube in stable position. Cardiomegaly with normal pulmonary vascularity.Persistent increased density noted over the right Lower lung, most likely layering pleural effusion. Underlying pulmonary infiltrate cannot be excluded. Small left pleural effusion cannot be excluded. Low lung volumes basilar atelectasis. No pneumothorax. IMPRESSION: 1. Lines and tubes in stable position. 2. Persistent increased density of the right lower lung, most likely layering pleural effusion. Underlying infiltrate cannot be excluded. 3. Low lung volumes with basilar atelectasis. Electronically Signed   By: Marcello Moores  Register   On: 01/24/2015 07:39   Dg Chest Portable 1 View  01/23/2015  CLINICAL DATA:  Assess orogastric tube positioning EXAM: PORTABLE CHEST 1 VIEW COMPARISON:  Portable chest x-ray of earlier today at 8:01 a.m. FINDINGS: The esophagogastric tube tip projects below the inferior margin of the image. There is no evidence of cord ongoing within the  esophagus. The endotracheal tube tip lies 4.1 cm above the carina. The cardiac silhouette is mildly enlarged but stable. The central pulmonary vascularity is engorged especially on the right. There is mild to moderate pulmonary interstitial edema on the right. There is no pleural effusion or pneumothorax. The observed bony thorax exhibits no acute abnormality. External pacemaker -defibrillator pads. Are present IMPRESSION: 1. The esophagogastric tube tip projects below the inferior margin of the image with no evidence of coiling within the esophagus. The endotracheal tube is in appropriate position. 2. Persistently increased perihilar interstitial density especially on the right consistent with pulmonary interstitial edema. There may be pleural fluid on the right layering posteriorly contributing to the findings here. Electronically Signed   By: David  Martinique M.D.   On: 01/23/2015 09:22   Dg Chest Port 1 View  01/23/2015  CLINICAL DATA:  Status post intubation.  Intra abdominal mass. EXAM: PORTABLE CHEST 1 VIEW COMPARISON:  CT chest 01/21/2015.  CT abdomen 01/21/2015. FINDINGS: The patient has a new endotracheal tube in place with the tip 3.5 cm above the carina. Hazy opacity over the right chest is consistent with layering pleural effusion. Smaller left pleural effusion is noted. No consolidative process or pneumothorax.  Heart size is upper normal. IMPRESSION: ET tube in good position with the tip projecting 3.5 cm above the carina. Right greater than left pleural effusions. Electronically Signed   By: Inge Rise M.D.   On: 01/23/2015 08:19   [place] Guide Roadmapping  01/23/2015  CLINICAL DATA:  65 year old male presents with acute life-threatening upper GI bleed secondary to an ulcerated mass encompassing the distal stomach and duodenum. 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  Endoscopic treatment options are limited and surgery is extremely high risk. Patient presents for emergent visceral arteriography and embolization. Additionally, central venous access is required. A triple-lumen central venous catheter will be placed during the course of the procedure. EXAM: SELECTIVE VISCERAL ARTERIOGRAPHY; IR EMBO ART VEN HEMORR LYMPH EXTRAV INC GUIDE ROADMAPPING; IR LEFT FLOURO GUIDE CV LINE; ADDITIONAL ARTERIOGRAPHY; IR ULTRASOUND GUIDANCE VASC ACCESS LEFT; ARTERIOGRAPHY Date: 01/23/2015 PROCEDURE: 1. Ultrasound-guided puncture left common femoral artery 2. Catheterization of the superior mesenteric artery with arteriogram 3. Catheterization of the celiac artery with arteriogram 4. Catheterization of the gastroduodenal artery with arteriogram 5. Catheterization of the right gastroepiploic artery with arteriogram 6. Coil embolization of the gastroduodenal artery 7. Post embolization arteriogram 8. Limited left common femoral arteriogram 9. Ultrasound-guided puncture of the left common femoral vein 10. Placement of a triple-lumen central venous catheter via the left femoral vein with fluoroscopic guidance Interventional Radiologist:  Criselda Peaches, MD ANESTHESIA/SEDATION: Moderate (conscious) sedation was used. 1 mg Versed, 50 mcg Fentanyl were administered intravenously. The patient's vital signs were monitored continuously by radiology nursing throughout the procedure. Sedation Time: 60 minutes MEDICATIONS: None additional FLUOROSCOPY TIME:  16 minutes for a total of 622 mGy CONTRAST:  130m OMNIPAQUE IOHEXOL 300 MG/ML  SOLN TECHNIQUE: Informed consent was obtained from the patient following explanation of the procedure, risks, benefits and alternatives. The patient understands, agrees and consents for the procedure. All questions were addressed. A time out was performed. Maximal barrier sterile technique utilized including caps, mask, sterile gowns, sterile gloves, large sterile drape,  hand hygiene, and Betadine skin prep. The left groin was interrogated with ultrasound. The common femoral artery is found to be widely patent. An image was obtained and stored for the medical record. Local anesthesia was attained by infiltration with 1% lidocaine. Under real-time sonographic guidance, the vessel was punctured with a 21 gauge micropuncture needle. Using standard technique, the initial micro wire was exchanged through a transitional 5 [place] sheath for a working 0.035 inch Bentson wire. The micro sheath was then exchanged for a working 5 [place] sheath. A C2 cobra catheter was advanced into the abdominal aorta over the wire. The catheter was initially advanced into the superior mesenteric artery and superior mesenteric arteriogram was performed. There is conventional anatomy. No replaced or accessory right hepatic artery. No evidence of active extravasation or significant collateralization through the pancreaticoduodenal arcade. The C2 cobra catheter was next advanced into the celiac artery. An arteriogram was performed. The gastroduodenal artery is elongated and slightly irregular in the mid segment likely secondary to external mass effect. There is no evidence of active extravasation. A renegade ST micro catheter was advanced coaxially over a fat and 16 wire. The micro catheter was advanced into the gastroduodenal artery. Arteriography was again performed. There are multiple branches arising from the gastroduodenal artery providing supply to a hypervascular mass in the region the descending duodenum. The micro catheter was  carefully advanced more distally into the proximal right gastroepiploic artery. Contrast injection was performed confirming the location of the catheter tip as well as confirming no additional supplied to the region of tumor. Coil embolization was then performed of the gastroduodenal artery using a combination of vortex, standard helical and soft interlock detachable  micro coils ranging in size from 2-6 mm. Post embolization arteriography confirms successful embolization of the vessel with no further visible hypervascular blush of the duodenum tumor. The micro catheter was removed. Repeat celiac artery injection was performed confirming no additional collateral supply. The 5 French catheter was readvanced of the superior mesenteric artery and an additional angiogram was performed again confirming no collateral flow to the embolized territory. The catheters were removed. The vascular sheath was left in place for hemodynamic monitoring. Ultrasound was again used to interrogate the left groin. The left common femoral vein was identified. Local anesthesia was attained by infiltration with 1% lidocaine. The left common femoral vein was punctured with an 18 gauge needle. A wire was advanced into the inferior vena cava. The skin tract was dilated and an arrow triple-lumen central venous catheter advanced over the wire and position with the tip at the confluence of the IVC and left common iliac vein. The catheter was flushed, capped and secured with 0 Prolene suture. Sterile bandages were applied. COMPLICATIONS: None IMPRESSION: 1. No evidence of active extravasation. 2. 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 3. Coil embolization of the gastroduodenal artery. 4. Placement of a left femoral triple-lumen central venous catheter. The catheter tip is at the confluence of the IVC and iliac vein and ready for immediate use. Signed, Criselda Peaches, MD [facility] Radiology Electronically Signed   By: Jacqulynn Cadet M.D.   On: 01/23/2015 15:50   US Abdomen Limited Ruq  01/19/2015  CLINICAL DATA:  Transaminitis. History of duodenal ulcer with adherent clot. EXAM: US ABDOMEN LIMITED - RIGHT UPPER QUADRANT COMPARISON:  None. FINDINGS: Gallbladder:  No gallstones or wall thickening visualized. Minimal dependent sludge. No sonographic Murphy sign noted. Small amounts of pericholecystic free fluid. Common bile duct: Diameter: 4.2 mm. Liver: Ill-defined heterogeneous echogenicity in the region of the pancreatic head and porta hepatis, borders not well-defined. Mildly heterogeneous hepatic parenchyma. No intrahepatic biliary ductal dilatation. Normal directional flow in the main portal vein. Right pleural effusion, incidentally noted. IMPRESSION: 1. Heterogeneous echogenicity in the region of the porta hepatis and pancreatic head, difficult to separate from the adjacent liver parenchyma. This may reflect hematoma/blood products in the setting of bleeding duodenal ulcer versus pancreatic head or focal liver lesion. There is no biliary ductal dilatation. Recommend further characterization with cross-sectional imaging, contrast-enhanced CT or MRI. 2. Minimal sludge in the gallbladder, no wall thickening or gallstones. Small amount of pericholecystic fluid. No sonographic Murphy sign or findings of acute cholecystitis. Electronically Signed   By: Jeb Levering M.D.   On: 01/19/2015 20:48    ASSESSMENT: 65 y.o.  man with a diagnosis of mantle cell non-Hodgkin's lymphoma presenting with syncope secondary to bleeding from a large ulcerated duodenal ulcer  (a) s/p coil embolization of the feeding (gastro-duodenal) artery 01/23/2015  (b) anemia--scant iron stores on bone marrow biopsy--s/p feraheme 02/13/2015  (1) right inguinal lymph node biopsy 01/22/2015 confirms mantle cell lymphoma  (a) bone marrow biopsy 02/06/2015 positive for involvement by the patient's mantle cell lymphoma  (b) IPI score of 5 (high risk) predicts  a 5 year progression free survival of 50% with CHOP-Rituxan chemotherapy  (c) MIPI score of 5 (intermediate risk) predicts a median survival of 58 months  (2) CHOP/Rituxan started 02/09/2015  (3)  Referral placed to [facility]  transplant group for consideration of consolidative transplant in [place]  PLAN: Will tolerated his first cycle of chemotherapy remarkably well. He is set up for cycle 2 in a couple of weeks.  I think you can go back to doing weights so long as he does not stress his abdomen. If he is lying on a bench or otherwise supporting his back I think he could do arm and leg exercises. In a few more weeks, after he recovers from his second cycle, I think he can do whatever he likes and source of exercise.  Once he completes his second cycle and before the third it would be useful to check for initial response. Probably the simplest way to do that would be to repeat his CT scans of the abdomen and pelvis in particular. If he is having a very good initial response we can continue what we are doing otherwise we can consider intensifying with DHAP/R or RICE.  He has a good understanding of the overall plan. He knows to call for any problems that may develop before his next visit here.  Chauncey Cruel, MD   02/16/2015 2:12 PM Medical Oncology and Hematology [facility] [address] [place], [place] [address] Tel. [phone]    Fax. [phone]

## 2015-02-18 ENCOUNTER — Telehealth: Payer: Self-pay | Admitting: Oncology

## 2015-02-18 NOTE — Telephone Encounter (Signed)
Appointments altered per 1/13 pof and patient will get a new avs 02/20/15

## 2015-02-19 ENCOUNTER — Encounter (HOSPITAL_COMMUNITY): Payer: Self-pay

## 2015-02-20 ENCOUNTER — Other Ambulatory Visit: Payer: Self-pay | Admitting: Oncology

## 2015-02-20 ENCOUNTER — Ambulatory Visit (HOSPITAL_BASED_OUTPATIENT_CLINIC_OR_DEPARTMENT_OTHER): Payer: BLUE CROSS/BLUE SHIELD

## 2015-02-20 VITALS — BP 109/72 | HR 82 | Temp 98.9°F | Resp 18

## 2015-02-20 DIAGNOSIS — D649 Anemia, unspecified: Secondary | ICD-10-CM | POA: Diagnosis not present

## 2015-02-20 DIAGNOSIS — K276 Chronic or unspecified peptic ulcer, site unspecified, with both hemorrhage and perforation: Secondary | ICD-10-CM

## 2015-02-20 DIAGNOSIS — K922 Gastrointestinal hemorrhage, unspecified: Secondary | ICD-10-CM

## 2015-02-20 DIAGNOSIS — D630 Anemia in neoplastic disease: Secondary | ICD-10-CM

## 2015-02-20 MED ORDER — SODIUM CHLORIDE 0.9 % IJ SOLN
10.0000 mL | INTRAMUSCULAR | Status: DC | PRN
  Administered 2015-02-20: 10 mL via INTRAVENOUS
  Filled 2015-02-20: qty 10

## 2015-02-20 MED ORDER — SODIUM CHLORIDE 0.9 % IV SOLN
Freq: Once | INTRAVENOUS | Status: AC
  Administered 2015-02-20: 14:00:00 via INTRAVENOUS

## 2015-02-20 MED ORDER — HEPARIN SOD (PORK) LOCK FLUSH 100 UNIT/ML IV SOLN
500.0000 [IU] | Freq: Once | INTRAVENOUS | Status: AC
  Administered 2015-02-20: 500 [IU] via INTRAVENOUS
  Filled 2015-02-20: qty 5

## 2015-02-20 MED ORDER — SODIUM CHLORIDE 0.9 % IV SOLN
510.0000 mg | Freq: Once | INTRAVENOUS | Status: AC
  Administered 2015-02-20: 510 mg via INTRAVENOUS
  Filled 2015-02-20: qty 17

## 2015-02-20 NOTE — Patient Instructions (Signed)

## 2015-02-23 ENCOUNTER — Ambulatory Visit: Payer: BLUE CROSS/BLUE SHIELD

## 2015-02-23 ENCOUNTER — Encounter: Payer: Self-pay | Admitting: Oncology

## 2015-02-23 NOTE — Progress Notes (Signed)
I spoke with sophia with patient ok and she said she needs medical notes for jan faxed to her at [phone]. She needs stay in hosp also. I called the patient back and left mess on cell with ph# for him to call to get hosp to send theirs.

## 2015-02-23 NOTE — Progress Notes (Signed)
Called and patient said he needs ext till 04/12/15, he will have forms sent to me via fax. He wants me to call sophia- [facility] to see what else they need-possible notes/labs

## 2015-03-01 ENCOUNTER — Encounter: Payer: Self-pay | Admitting: Nurse Practitioner

## 2015-03-01 ENCOUNTER — Ambulatory Visit (HOSPITAL_BASED_OUTPATIENT_CLINIC_OR_DEPARTMENT_OTHER): Payer: BLUE CROSS/BLUE SHIELD

## 2015-03-01 ENCOUNTER — Other Ambulatory Visit (HOSPITAL_BASED_OUTPATIENT_CLINIC_OR_DEPARTMENT_OTHER): Payer: BLUE CROSS/BLUE SHIELD

## 2015-03-01 ENCOUNTER — Encounter: Payer: Self-pay | Admitting: Oncology

## 2015-03-01 ENCOUNTER — Ambulatory Visit (HOSPITAL_BASED_OUTPATIENT_CLINIC_OR_DEPARTMENT_OTHER): Payer: BLUE CROSS/BLUE SHIELD | Admitting: Nurse Practitioner

## 2015-03-01 VITALS — BP 122/85 | HR 94 | Temp 98.0°F | Resp 18

## 2015-03-01 VITALS — BP 127/78 | HR 82 | Temp 98.0°F | Resp 18 | Ht 70.0 in | Wt 191.6 lb

## 2015-03-01 DIAGNOSIS — Z5112 Encounter for antineoplastic immunotherapy: Secondary | ICD-10-CM

## 2015-03-01 DIAGNOSIS — Z5111 Encounter for antineoplastic chemotherapy: Secondary | ICD-10-CM

## 2015-03-01 DIAGNOSIS — C8315 Mantle cell lymphoma, lymph nodes of inguinal region and lower limb: Secondary | ICD-10-CM | POA: Diagnosis not present

## 2015-03-01 DIAGNOSIS — D649 Anemia, unspecified: Secondary | ICD-10-CM

## 2015-03-01 DIAGNOSIS — C8319 Mantle cell lymphoma, extranodal and solid organ sites: Secondary | ICD-10-CM

## 2015-03-01 DIAGNOSIS — K922 Gastrointestinal hemorrhage, unspecified: Secondary | ICD-10-CM

## 2015-03-01 LAB — CBC WITH DIFFERENTIAL/PLATELET
BASO%: 0.4 % (ref 0.0–2.0)
BASOS ABS: 0.1 10*3/uL (ref 0.0–0.1)
EOS%: 0.1 % (ref 0.0–7.0)
Eosinophils Absolute: 0 10*3/uL (ref 0.0–0.5)
HCT: 33.6 % — ABNORMAL LOW (ref 38.4–49.9)
HGB: 11.2 g/dL — ABNORMAL LOW (ref 13.0–17.1)
LYMPH#: 0.9 10*3/uL (ref 0.9–3.3)
LYMPH%: 7.5 % — ABNORMAL LOW (ref 14.0–49.0)
MCH: 28.3 pg (ref 27.2–33.4)
MCHC: 33.3 g/dL (ref 32.0–36.0)
MCV: 84.8 fL (ref 79.3–98.0)
MONO#: 0.4 10*3/uL (ref 0.1–0.9)
MONO%: 3.4 % (ref 0.0–14.0)
NEUT#: 10.8 10*3/uL — ABNORMAL HIGH (ref 1.5–6.5)
NEUT%: 88.6 % — ABNORMAL HIGH (ref 39.0–75.0)
NRBC: 0 % (ref 0–0)
Platelets: 408 10*3/uL — ABNORMAL HIGH (ref 140–400)
RBC: 3.96 10*6/uL — AB (ref 4.20–5.82)
RDW: 16.6 % — AB (ref 11.0–14.6)
WBC: 12.2 10*3/uL — ABNORMAL HIGH (ref 4.0–10.3)

## 2015-03-01 MED ORDER — ACETAMINOPHEN 325 MG PO TABS
650.0000 mg | ORAL_TABLET | Freq: Once | ORAL | Status: AC
  Administered 2015-03-01: 650 mg via ORAL

## 2015-03-01 MED ORDER — DIPHENHYDRAMINE HCL 25 MG PO CAPS
25.0000 mg | ORAL_CAPSULE | Freq: Once | ORAL | Status: AC
  Administered 2015-03-01: 25 mg via ORAL

## 2015-03-01 MED ORDER — ACETAMINOPHEN 325 MG PO TABS
ORAL_TABLET | ORAL | Status: AC
  Filled 2015-03-01: qty 2

## 2015-03-01 MED ORDER — HEPARIN SOD (PORK) LOCK FLUSH 100 UNIT/ML IV SOLN
500.0000 [IU] | Freq: Once | INTRAVENOUS | Status: AC | PRN
  Administered 2015-03-01: 500 [IU]
  Filled 2015-03-01: qty 5

## 2015-03-01 MED ORDER — SODIUM CHLORIDE 0.9 % IV SOLN
Freq: Once | INTRAVENOUS | Status: AC
  Administered 2015-03-01: 11:00:00 via INTRAVENOUS

## 2015-03-01 MED ORDER — CYCLOPHOSPHAMIDE CHEMO INJECTION 1 GM
750.0000 mg/m2 | Freq: Once | INTRAMUSCULAR | Status: AC
  Administered 2015-03-01: 1640 mg via INTRAVENOUS
  Filled 2015-03-01: qty 82

## 2015-03-01 MED ORDER — VINCRISTINE SULFATE CHEMO INJECTION 1 MG/ML
2.0000 mg | Freq: Once | INTRAVENOUS | Status: AC
  Administered 2015-03-01: 2 mg via INTRAVENOUS
  Filled 2015-03-01: qty 2

## 2015-03-01 MED ORDER — RITUXIMAB CHEMO INJECTION 500 MG/50ML
375.0000 mg/m2 | Freq: Once | INTRAVENOUS | Status: AC
  Administered 2015-03-01: 800 mg via INTRAVENOUS
  Filled 2015-03-01: qty 80

## 2015-03-01 MED ORDER — DOXORUBICIN HCL CHEMO IV INJECTION 2 MG/ML
50.0000 mg/m2 | Freq: Once | INTRAVENOUS | Status: AC
  Administered 2015-03-01: 110 mg via INTRAVENOUS
  Filled 2015-03-01: qty 55

## 2015-03-01 MED ORDER — SODIUM CHLORIDE 0.9 % IV SOLN
Freq: Once | INTRAVENOUS | Status: AC
  Administered 2015-03-01: 11:00:00 via INTRAVENOUS
  Filled 2015-03-01: qty 8

## 2015-03-01 MED ORDER — SODIUM CHLORIDE 0.9 % IJ SOLN
10.0000 mL | INTRAMUSCULAR | Status: DC | PRN
  Administered 2015-03-01: 10 mL
  Filled 2015-03-01: qty 10

## 2015-03-01 MED ORDER — PEGFILGRASTIM 6 MG/0.6ML ~~LOC~~ PSKT
6.0000 mg | PREFILLED_SYRINGE | Freq: Once | SUBCUTANEOUS | Status: AC
  Administered 2015-03-01: 6 mg via SUBCUTANEOUS
  Filled 2015-03-01: qty 0.6

## 2015-03-01 MED ORDER — DIPHENHYDRAMINE HCL 25 MG PO CAPS
ORAL_CAPSULE | ORAL | Status: AC
  Filled 2015-03-01: qty 1

## 2015-03-01 NOTE — Progress Notes (Signed)
Per Dr. Jana Hakim- ok to use CMET from 02/09/15 for treatment today.

## 2015-03-01 NOTE — Patient Instructions (Signed)
[facility] Discharge Instructions for Patients Receiving Chemotherapy  Today you received the following chemotherapy agents Rituxan, Cytoxan, Adriamycin, Vincristine  To help prevent nausea and vomiting after your treatment, we encourage you to take your nausea medication    If you develop nausea and vomiting that is not controlled by your nausea medication, call the clinic.   BELOW ARE SYMPTOMS THAT SHOULD BE REPORTED IMMEDIATELY:  *FEVER GREATER THAN 100.5 F  *CHILLS WITH OR WITHOUT FEVER  NAUSEA AND VOMITING THAT IS NOT CONTROLLED WITH YOUR NAUSEA MEDICATION  *UNUSUAL SHORTNESS OF BREATH  *UNUSUAL BRUISING OR BLEEDING  TENDERNESS IN MOUTH AND THROAT WITH OR WITHOUT PRESENCE OF ULCERS  *URINARY PROBLEMS  *BOWEL PROBLEMS  UNUSUAL RASH Items with * indicate a potential emergency and should be followed up as soon as possible.  Feel free to call the clinic you have any questions or concerns. The clinic phone number is [phone].  Please show the CHEMO ALERT CARD at check-in to the Emergency Department and triage nurse.   

## 2015-03-01 NOTE — Progress Notes (Signed)
I faxed letter of extension to sophia [phone]. See prev notes. sharepoint noted.

## 2015-03-01 NOTE — Progress Notes (Signed)
I placed unum form on desk, for dr. Jana Hakim.

## 2015-03-01 NOTE — Progress Notes (Signed)
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 He "blew this off" and it did not happen again until 01/17/2015 when he felt weak at work and later fainted at home. He was brought to the ED where he was found to be guaiac positive (denies melena before that date) with a Hb of 4.5 MCV 82.2, platelets 295K, WBC 9.1 and normal INR and PTT. He was admitted and underwent EGD under Dr Michail Sermon 01/18/2015 showing a very large duodenal ulcer w clot. Abdominal US 01/19/2015 was not very informative but CT abd/pelvis 12/17,2016 showed adenopathy in the anterior mediastinum, retroperitoneum and pelvis, with a large ulcerated mass involving the distal stomach/proximal duodenum and extending into the gastrohepatic region.   On 01/22/2015 he underwent right inguinal lymph node biopsy, consistent with mantle cell lymphoma (([id number] and [id number])). Specifically the lymph node showed effacement of the architecture by sheets of small to medium sized lymphocytes with no apparent nodularity. The cells were positive for CD20, CD5, BCL-2, CD21, and cyclin D1. He 67 was low (10-20%). CD21 revealed a few residual germinal centers area at the B cells were negative for CD10, and BCL-6. CD23 was negative.  On 02/06/2015 the patient  underwent a bone marrow aspirate and biopsy. The pathology from this procedure (FZB 17-2 and 17-7) was positive for involvement by non-Hodgkin's lymphoma with numerous atypical interstitial and paratrabecular lymphoid aggregates consistent with mantle cell lymphoma. Iron stain showed scant iron flow cytometry confirmed a monoclonal B-cell population, lambda restricted, consistent with the patient's known diagnosis of mantle cell lymphoma.  On 01/18/2015 the patient underwent EGD which found a massive duodenal bulb ulcer with adherent clot, although no active bleeding.his hemoglobin was in the 8.4 range and stable. He was discharged on 01/22/2015, but within 24 hours was readmitted with syncope and altered mental status. He had aspirated and required intubation for his respiratory failure. Hemoglobin was down to 6.3. An NG tube was placed, the patient was multiply transfused, and Surgery was considered but felt not to be feasible.accordingly the patient underwent arteriography 01/23/2015 which found the superior mesenteric and celiac arteries not to be the involved vessels. The gastroduodenal artery showed multiple branches providing supply to the hypervascular duodenal mass, and this artery was coil embolized.. Also a port was placed at the time of this procedure with a view to eventual chemotherapy.  He was discharged 01/26/2015,but required readmission 02/07/2015 for another presyncopal episode. He presented to the emergency room where he was found to have a hemoglobin of 7.5. The patient was transfusedIIA hemoglobin of 10 and was discharged 02/07/2014. He was scheduled to start CHOP right toxin 02/08/2014.  His subsequent history is as detailed below.  INTERVAL HISTORY: [name] returns today for follow-up of his mantle cell lymphoma. Today is day 1 cycle 2 of 6 planned cycles of CHOP/rituximab, given every 3 weeks.  REVIEW OF SYSTEMS: [name] denies  fevers, chills, nausea, or vomiting. He had one episode of  diarrhea this past week after having too much cheese. His appetite is excellent. He denies pain. He has no mouth sores, rashes, or neuropathy symptoms. He has had no excessive bruising or bleeding. His energy level is good and greatly desires to begin working out again. He denies shortness of breath, chest pain, cough, or palpitations. He has not noticed any altered sleep from the steroids. A detailed review of systems is otherwise stable.  PAST MEDICAL HISTORY: Past Medical History  Diagnosis Date  . Essential hypertension   . HLD (hyperlipidemia)   . GERD (gastroesophageal reflux disease)   . Anemia   . Cancer ([place])     Lymphoma  . GI bleed   . Weakness     PAST SURGICAL HISTORY: Past Surgical History  Procedure Laterality Date  . Skin surgery      Small benign cysts over left scalp removed  . Esophagogastroduodenoscopy Left 01/18/2015    Procedure: ESOPHAGOGASTRODUODENOSCOPY (EGD);  Surgeon: Wilford Corner, MD;  Location: [facility] ENDOSCOPY;  Service: Endoscopy;  Laterality: Left;  . Inguinal hernia repair Right 01/22/2015    Procedure: RIGHT INGUINAL LYMPH NODE BX;  Surgeon: Donnie Mesa, MD;  Location: [facility];  Service: General;  Laterality: Right;  . Portacath placement  01/26/2015     power port with tip SVC/RA [place]    FAMILY HISTORY Family History  Problem Relation Age of Onset  . Hypertension Mother   . Hypertension Father   . Hypertension Sister   . Diabetes Sister   . Prostate cancer Brother   . Lupus Sister   . Kidney failure Father   The patient's father died from CHF complications age 15. The patient's mother is 65 y/o as of December 2016. The patient has 9 brothers, 8 sisters. One brother has prostate cancer. There is no other cancer history in the family to his knowledge.  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 His wife of 80 years, [name], is disabled due to RA. Daughtr [name] works as a Teacher, early years/pre for  [employer] (she previously worked at the [employer] in records). Son [name] owns a window washing business. Son [name] is in Press photographer at [employer]. All live in [place]. The patient has 3 grandchildren. He attends a local [place] DIRECTIVES: Not in place   HEALTH MAINTENANCE: Social History  Substance Use Topics  . Smoking status: Never Smoker   . Smokeless tobacco: Never Used  . Alcohol Use: Yes     Comment: socially     Colonoscopy:  PSA: 2.27 on 01/22/2015  Hepatitis serologies (be surface antigen, hepatitis B and hepatitis see IgM) negative 01/18/2015  No Known Allergies  Current Outpatient Prescriptions  Medication Sig Dispense Refill  . allopurinol (ZYLOPRIM) 300 MG tablet Take 1 tablet (300 mg total) by mouth daily. 30 tablet 3  . lisinopril-hydrochlorothiazide (PRINZIDE,ZESTORETIC) 20-25 MG tablet Take 1 tablet by mouth daily.    Marland Kitchen LORazepam (ATIVAN) 0.5 MG tablet Take 1 tablet (0.5 mg total) by mouth every 6 (six) hours as needed (Nausea or vomiting). 30 tablet 0  . lovastatin (MEVACOR) 20 MG tablet Take 20 mg by mouth at bedtime.    Marland Kitchen omeprazole (PRILOSEC) 40 MG capsule Take 1 capsule (40 mg total) by mouth 2 (two) times daily. 60 capsule 0  . ondansetron (ZOFRAN) 8 MG tablet Take 1 tablet (8 mg total)  by mouth 2 (two) times daily. Start the day after chemo for 3 days. Then as needed for nausea or vomiting. 30 tablet 1  . predniSONE (DELTASONE) 20 MG tablet Take 3 tablets a day ( in am ) with food x 5 days starting on day 1 of treatment plan 15 tablet 6  . prochlorperazine (COMPAZINE) 10 MG tablet Take 1 tablet (10 mg total) by mouth every 6 (six) hours as needed (Nausea or vomiting). 30 tablet 6  . sucralfate (CARAFATE) 1 g tablet Take 1 tablet (1 g total) by mouth 4 (four) times daily -  with meals and at bedtime. 90 tablet 0   No current facility-administered medications for this visit.    OBJECTIVE: Middle-aged African-American man in no  acute distress Filed Vitals:   03/01/15 0841  BP: 127/78  Pulse: 82  Temp: 98 F (36.7 C)  Resp: 18     Body mass index is 27.49 kg/(m^2).    ECOG FS:0 - Asymptomatic  Skin: warm, dry  HEENT: sclerae anicteric, conjunctivae pink, oropharynx clear. No thrush or mucositis.  Lymph Nodes: No cervical or supraclavicular lymphadenopathy  Lungs: clear to auscultation bilaterally, no rales, wheezes, or rhonci  Heart: regular rate and rhythm  Abdomen: round, soft, non tender, positive bowel sounds  Musculoskeletal: No focal spinal tenderness, no peripheral edema  Neuro: non focal, well oriented, positive affect   LAB RESULTS:  CMP     Component Value Date/Time   NA 136 02/09/2015 1011   NA 138 02/08/2015 0705   K 4.6 02/09/2015 1011   K 4.0 02/08/2015 0705   CL 102 02/08/2015 0705   CO2 25 02/09/2015 1011   CO2 27 02/08/2015 0705   GLUCOSE 117 02/09/2015 1011   GLUCOSE 115* 02/08/2015 0705   BUN 18.8 02/09/2015 1011   BUN 22* 02/08/2015 0705   CREATININE 1.0 02/09/2015 1011   CREATININE 1.11 02/08/2015 0705   CALCIUM 8.9 02/09/2015 1011   CALCIUM 8.9 02/08/2015 0705   PROT 6.9 02/09/2015 1011   PROT 5.4* 01/27/2015 0544   ALBUMIN 3.3* 02/09/2015 1011   ALBUMIN 2.5* 01/27/2015 0544   AST 14 02/09/2015 1011   AST 24 01/27/2015 0544   ALT 11 02/09/2015 1011   ALT 23 01/27/2015 0544   ALKPHOS 57 02/09/2015 1011   ALKPHOS 55 01/27/2015 0544   BILITOT 0.32 02/09/2015 1011   BILITOT 0.6 01/27/2015 0544   GFRNONAA >60 02/08/2015 0705   GFRAA >60 02/08/2015 0705    INo results found for: SPEP, UPEP  Lab Results  Component Value Date   WBC 12.2* 03/01/2015   NEUTROABS 10.8* 03/01/2015   HGB 11.2* 03/01/2015   HCT 33.6* 03/01/2015   MCV 84.8 03/01/2015   PLT 408* 03/01/2015      Chemistry      Component Value Date/Time   NA 136 02/09/2015 1011   NA 138 02/08/2015 0705   K 4.6 02/09/2015 1011   K 4.0 02/08/2015 0705   CL 102 02/08/2015 0705   CO2 25 02/09/2015  1011   CO2 27 02/08/2015 0705   BUN 18.8 02/09/2015 1011   BUN 22* 02/08/2015 0705   CREATININE 1.0 02/09/2015 1011   CREATININE 1.11 02/08/2015 0705      Component Value Date/Time   CALCIUM 8.9 02/09/2015 1011   CALCIUM 8.9 02/08/2015 0705   ALKPHOS 57 02/09/2015 1011   ALKPHOS 55 01/27/2015 0544   AST 14 02/09/2015 1011   AST 24 01/27/2015 0544   ALT 11 02/09/2015  1011   ALT 23 01/27/2015 0544   BILITOT 0.32 02/09/2015 1011   BILITOT 0.6 01/27/2015 0544       No results found for: LABCA2  No components found for: ZOXWR604  No results for input(s): INR in the last 168 hours.  Urinalysis    Component Value Date/Time   COLORURINE YELLOW 01/23/2015 0805   APPEARANCEUR CLEAR 01/23/2015 0805   LABSPEC 1.014 01/23/2015 0805   PHURINE 7.5 01/23/2015 0805   GLUCOSEU NEGATIVE 01/23/2015 0805   HGBUR NEGATIVE 01/23/2015 0805   BILIRUBINUR NEGATIVE 01/23/2015 0805   KETONESUR NEGATIVE 01/23/2015 0805   PROTEINUR NEGATIVE 01/23/2015 0805   NITRITE NEGATIVE 01/23/2015 0805   LEUKOCYTESUR NEGATIVE 01/23/2015 0805    STUDIES: Ct Biopsy  02/06/2015  INDICATION: Lymphoma. Please perform CT-guided bone marrow biopsy for tissue diagnostic purposes. EXAM: CT-GUIDED BONE MARROW BIOPSY AND ASPIRATION MEDICATIONS: Fentanyl 75 mcg IV; Versed 1.5 mg IV ANESTHESIA/SEDATION: Sedation Time 5 minutes CONTRAST:  None COMPLICATIONS: None immediate. PROCEDURE: Informed consent was obtained from the patient following an explanation of the procedure, risks, benefits and alternatives. The patient understands, agrees and consents for the procedure. All questions were addressed. A time out was performed prior to the initiation of the procedure. The patient was positioned prone and non-contrast localization CT was performed of the pelvis to demonstrate the iliac marrow spaces. The operative site was prepped and draped in the usual sterile fashion. Under sterile conditions and local anesthesia, a 22 gauge  spinal needle was utilized for procedural planning. Next, an 11 gauge coaxial bone biopsy needle was advanced into the right iliac marrow space. Needle position was confirmed with CT imaging. Initially, bone marrow aspiration was performed. Next, a bone marrow biopsy was obtained with the 11 gauge outer bone marrow device. Samples were prepared with the cytotechnologist and deemed adequate. The needle was removed intact. Hemostasis was obtained with compression and a dressing was placed. The patient tolerated the procedure well without immediate post procedural complication. IMPRESSION: Successful CT guided right iliac bone marrow aspiration and core biopsy. Electronically Signed   By: Sandi Mariscal M.D.   On: 02/06/2015 10:18    ASSESSMENT: 65 y.o. [place] man with a diagnosis of mantle cell non-Hodgkin's lymphoma presenting with syncope secondary to bleeding from a large ulcerated duodenal ulcer  (a) s/p coil embolization of the feeding (gastro-duodenal) artery 01/23/2015  (b) anemia--scant iron stores on bone marrow biopsy--s/p feraheme 02/13/2015  (1) right inguinal lymph node biopsy 01/22/2015 confirms mantle cell lymphoma  (a) bone marrow biopsy 02/06/2015 positive for involvement by the patient's mantle cell lymphoma  (b) IPI score of 5 (high risk) predicts a 5 year progression free survival of 50% with CHOP-Rituxan chemotherapy  (c) MIPI score of 5 (intermediate risk) predicts a median survival of 58 months  (2) CHOP/Rituxan started 02/09/2015  (3)  Referral placed to [facility] transplant group for consideration of consolidative transplant in [place]  PLAN: [name] is in great spirits today and is feeling well. The labs were reviewed in detail and were sufficient for treatment. His hgb is trending in the right direction since his iron infusions earlier in the month. [name] [name] proceed with cycle 2 of RCHOP as planned today.   He [name] take it easy with weight lifting this week and the next, but  per Dr. [name] last note he should be ok to do arm exercises with a bench, avoiding pressure on the abdomen.   [name] [name] return in 1 week for follow up. He  understands and agrees with this plan. He has been encouraged to call with any issues that might arise before his next visit here.  Laurie Panda, NP   03/01/2015 9:28 AM

## 2015-03-02 ENCOUNTER — Ambulatory Visit: Payer: BLUE CROSS/BLUE SHIELD

## 2015-03-08 ENCOUNTER — Ambulatory Visit (HOSPITAL_BASED_OUTPATIENT_CLINIC_OR_DEPARTMENT_OTHER): Payer: BLUE CROSS/BLUE SHIELD | Admitting: Nurse Practitioner

## 2015-03-08 ENCOUNTER — Encounter: Payer: Self-pay | Admitting: Nurse Practitioner

## 2015-03-08 ENCOUNTER — Other Ambulatory Visit (HOSPITAL_BASED_OUTPATIENT_CLINIC_OR_DEPARTMENT_OTHER): Payer: BLUE CROSS/BLUE SHIELD

## 2015-03-08 VITALS — BP 113/79 | HR 89 | Temp 97.7°F | Resp 18 | Ht 70.0 in | Wt 196.2 lb

## 2015-03-08 DIAGNOSIS — C8319 Mantle cell lymphoma, extranodal and solid organ sites: Secondary | ICD-10-CM

## 2015-03-08 DIAGNOSIS — D649 Anemia, unspecified: Secondary | ICD-10-CM

## 2015-03-08 DIAGNOSIS — C8315 Mantle cell lymphoma, lymph nodes of inguinal region and lower limb: Secondary | ICD-10-CM

## 2015-03-08 DIAGNOSIS — K922 Gastrointestinal hemorrhage, unspecified: Secondary | ICD-10-CM

## 2015-03-08 LAB — COMPREHENSIVE METABOLIC PANEL
ALK PHOS: 140 U/L (ref 40–150)
ALT: 12 U/L (ref 0–55)
AST: 10 U/L (ref 5–34)
Albumin: 3.7 g/dL (ref 3.5–5.0)
Anion Gap: 12 mEq/L — ABNORMAL HIGH (ref 3–11)
BUN: 19.8 mg/dL (ref 7.0–26.0)
CHLORIDE: 99 meq/L (ref 98–109)
CO2: 27 meq/L (ref 22–29)
Calcium: 9 mg/dL (ref 8.4–10.4)
Creatinine: 1 mg/dL (ref 0.7–1.3)
GLUCOSE: 107 mg/dL (ref 70–140)
POTASSIUM: 3.8 meq/L (ref 3.5–5.1)
SODIUM: 138 meq/L (ref 136–145)
Total Bilirubin: <0.3 mg/dL (ref 0.20–1.20)
Total Protein: 6.6 g/dL (ref 6.4–8.3)

## 2015-03-08 LAB — CBC WITH DIFFERENTIAL/PLATELET
BASO%: 0.1 % (ref 0.0–2.0)
BASOS ABS: 0 10*3/uL (ref 0.0–0.1)
EOS ABS: 0.1 10*3/uL (ref 0.0–0.5)
EOS%: 0.2 % (ref 0.0–7.0)
HEMATOCRIT: 31.3 % — AB (ref 38.4–49.9)
HEMOGLOBIN: 10.5 g/dL — AB (ref 13.0–17.1)
LYMPH#: 1.2 10*3/uL (ref 0.9–3.3)
LYMPH%: 4.2 % — ABNORMAL LOW (ref 14.0–49.0)
MCH: 28.2 pg (ref 27.2–33.4)
MCHC: 33.5 g/dL (ref 32.0–36.0)
MCV: 84.1 fL (ref 79.3–98.0)
MONO#: 1.3 10*3/uL — AB (ref 0.1–0.9)
MONO%: 4.6 % (ref 0.0–14.0)
NEUT#: 25.4 10*3/uL — ABNORMAL HIGH (ref 1.5–6.5)
NEUT%: 90.9 % — AB (ref 39.0–75.0)
NRBC: 0 % (ref 0–0)
Platelets: 394 10*3/uL (ref 140–400)
RBC: 3.72 10*6/uL — ABNORMAL LOW (ref 4.20–5.82)
RDW: 17.2 % — AB (ref 11.0–14.6)
WBC: 28 10*3/uL — ABNORMAL HIGH (ref 4.0–10.3)

## 2015-03-08 LAB — LACTATE DEHYDROGENASE: LDH: 255 U/L — ABNORMAL HIGH (ref 125–245)

## 2015-03-08 NOTE — Progress Notes (Signed)
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 He "blew this off" and it did not happen again until 01/17/2015 when he felt weak at work and later fainted at home. He was brought to the ED where he was found to be guaiac positive (denies melena before that date) with a Hb of 4.5 MCV 82.2, platelets 295K, WBC 9.1 and normal INR and PTT. He was admitted and underwent EGD under Dr Michail Sermon 01/18/2015 showing a very large duodenal ulcer w clot. Abdominal US 01/19/2015 was not very informative but CT abd/pelvis 12/17,2016 showed adenopathy in the anterior mediastinum, retroperitoneum and pelvis, with a large ulcerated mass involving the distal stomach/proximal duodenum and extending into the gastrohepatic region.   On 01/22/2015 he underwent right inguinal lymph node biopsy, consistent with mantle cell lymphoma (([id number] and [id number])). Specifically the lymph node showed effacement of the architecture by sheets of small to medium sized lymphocytes with no apparent nodularity. The cells were positive for CD20, CD5, BCL-2, CD21, and cyclin D1. He 63 was low (10-20%). CD21 revealed a few residual germinal centers area at the B cells were negative for CD10, and BCL-6. CD23 was negative.  On 02/06/2015 the patient underwent  a bone marrow aspirate and biopsy. The pathology from this procedure (FZB 17-2 and 17-7) was positive for involvement by non-Hodgkin's lymphoma with numerous atypical interstitial and paratrabecular lymphoid aggregates consistent with mantle cell lymphoma. Iron stain showed scant iron flow cytometry confirmed a monoclonal B-cell population, lambda restricted, consistent with the patient's known diagnosis of mantle cell lymphoma.  On 01/18/2015 the patient underwent EGD which found a massive duodenal bulb ulcer with adherent clot, although no active bleeding.his hemoglobin was in the 8.4 range and stable. He was discharged on 01/22/2015, but within 24 hours was readmitted with syncope and altered mental status. He had aspirated and required intubation for his respiratory failure. Hemoglobin was down to 6.3. An NG tube was placed, the patient was multiply transfused, and Surgery was considered but felt not to be feasible.accordingly the patient underwent arteriography 01/23/2015 which found the superior mesenteric and celiac arteries not to be the involved vessels. The gastroduodenal artery showed multiple branches providing supply to the hypervascular duodenal mass, and this artery was coil embolized.. Also a port was placed at the time of this procedure with a view to eventual chemotherapy.  He was discharged 01/26/2015,but required readmission 02/07/2015 for another presyncopal episode. He presented to the emergency room where he was found to have a hemoglobin of 7.5. The patient was transfusedIIA hemoglobin of 10 and was discharged 02/07/2014. He was scheduled to start CHOP right toxin 02/08/2014.  His subsequent history is as detailed below.  INTERVAL HISTORY: [name] returns today for follow-up of his mantle cell lymphoma. Today is day 8 cycle 2 of 6 planned cycles of CHOP/rituximab, given every 3 weeks.  REVIEW OF SYSTEMS: [name] had no  side effects from treatment last week. His energy remains good. He  denies fevers, chills, nausea, vomiting, or changes in bowel or bladder habits. His appetite is good. He denies mouth sores, rashes, or neuropathy symptoms. He is using light weights for upper body exercises and has no pain to his abdomen from this. He denies bruising or bleeding. He sleeps well. A detailed review of systems is otherwise stable.   PAST MEDICAL HISTORY: Past Medical History  Diagnosis Date  . Essential hypertension   . HLD (hyperlipidemia)   . GERD (gastroesophageal reflux disease)   . Anemia   . Cancer ([place])     Lymphoma  . GI bleed   . Weakness     PAST SURGICAL HISTORY: Past Surgical History  Procedure Laterality Date  . Skin surgery      Small benign cysts over left scalp removed  . Esophagogastroduodenoscopy Left 01/18/2015    Procedure: ESOPHAGOGASTRODUODENOSCOPY (EGD);  Surgeon: Wilford Corner, MD;  Location: [facility] ENDOSCOPY;  Service: Endoscopy;  Laterality: Left;  . Inguinal hernia repair Right 01/22/2015    Procedure: RIGHT INGUINAL LYMPH NODE BX;  Surgeon: Donnie Mesa, MD;  Location: [facility];  Service: General;  Laterality: Right;  . Portacath placement  01/26/2015     power port with tip SVC/RA [place]    FAMILY HISTORY Family History  Problem Relation Age of Onset  . Hypertension Mother   . Hypertension Father   . Hypertension Sister   . Diabetes Sister   . Prostate cancer Brother   . Lupus Sister   . Kidney failure Father   The patient's father died from CHF complications age 34. The patient's mother is 15 y/o as of December 2016. The patient has 9 brothers, 8 sisters. One brother has prostate cancer. There is no other cancer history in the family to his knowledge.  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 His wife of 25 years, [name], is disabled due to RA. Daughtr [name] works as a Teacher, early years/pre for [employer] (she previously worked at the [employer] in records). Son [name] owns a window washing  business. Son [name] is in Press photographer at [employer]. All live in [place]. The patient has 3 grandchildren. He attends a local [employer] DIRECTIVES: Not in place   HEALTH MAINTENANCE: Social History  Substance Use Topics  . Smoking status: Never Smoker   . Smokeless tobacco: Never Used  . Alcohol Use: Yes     Comment: socially     Colonoscopy:  PSA: 2.27 on 01/22/2015  Hepatitis serologies (be surface antigen, hepatitis B and hepatitis see IgM) negative 01/18/2015  No Known Allergies  Current Outpatient Prescriptions  Medication Sig Dispense Refill  . allopurinol (ZYLOPRIM) 300 MG tablet Take 1 tablet (300 mg total) by mouth daily. 30 tablet 3  . lisinopril-hydrochlorothiazide (PRINZIDE,ZESTORETIC) 20-25 MG tablet Take 1 tablet by mouth daily.    Marland Kitchen LORazepam (ATIVAN) 0.5 MG tablet Take 1 tablet (0.5 mg total) by mouth every 6 (six) hours as needed (Nausea or vomiting). 30 tablet 0  . lovastatin (MEVACOR) 20 MG tablet Take 20 mg by mouth at bedtime.    Marland Kitchen omeprazole (PRILOSEC) 40 MG capsule Take 1 capsule (40 mg total) by mouth 2 (two) times daily. 60 capsule 0  . ondansetron (ZOFRAN) 8 MG tablet Take 1 tablet (8 mg total) by mouth 2 (two) times daily. Start the day after chemo for 3 days.  Then as needed for nausea or vomiting. 30 tablet 1  . predniSONE (DELTASONE) 20 MG tablet Take 3 tablets a day ( in am ) with food x 5 days starting on day 1 of treatment plan 15 tablet 6  . sucralfate (CARAFATE) 1 g tablet Take 1 tablet (1 g total) by mouth 4 (four) times daily -  with meals and at bedtime. 90 tablet 0  . prochlorperazine (COMPAZINE) 10 MG tablet Take 1 tablet (10 mg total) by mouth every 6 (six) hours as needed (Nausea or vomiting). (Patient not taking: Reported on 03/08/2015) 30 tablet 6   No current facility-administered medications for this visit.    OBJECTIVE: Middle-aged African-American man in no acute distress Filed Vitals:   03/08/15 1324  BP:  113/79  Pulse: 89  Temp: 97.7 F (36.5 C)  Resp: 18     Body mass index is 28.15 kg/(m^2).    ECOG FS:0 - Asymptomatic  Sclerae unicteric, pupils round and equal Oropharynx clear and moist-- no thrush or other lesions No cervical or supraclavicular adenopathy Lungs no rales or rhonchi Heart regular rate and rhythm Abd soft, nontender, positive bowel sounds MSK no focal spinal tenderness, no upper extremity lymphedema Neuro: nonfocal, well oriented, appropriate affect  LAB RESULTS:  CMP     Component Value Date/Time   NA 136 02/09/2015 1011   NA 138 02/08/2015 0705   K 4.6 02/09/2015 1011   K 4.0 02/08/2015 0705   CL 102 02/08/2015 0705   CO2 25 02/09/2015 1011   CO2 27 02/08/2015 0705   GLUCOSE 117 02/09/2015 1011   GLUCOSE 115* 02/08/2015 0705   BUN 18.8 02/09/2015 1011   BUN 22* 02/08/2015 0705   CREATININE 1.0 02/09/2015 1011   CREATININE 1.11 02/08/2015 0705   CALCIUM 8.9 02/09/2015 1011   CALCIUM 8.9 02/08/2015 0705   PROT 6.9 02/09/2015 1011   PROT 5.4* 01/27/2015 0544   ALBUMIN 3.3* 02/09/2015 1011   ALBUMIN 2.5* 01/27/2015 0544   AST 14 02/09/2015 1011   AST 24 01/27/2015 0544   ALT 11 02/09/2015 1011   ALT 23 01/27/2015 0544   ALKPHOS 57 02/09/2015 1011   ALKPHOS 55 01/27/2015 0544   BILITOT 0.32 02/09/2015 1011   BILITOT 0.6 01/27/2015 0544   GFRNONAA >60 02/08/2015 0705   GFRAA >60 02/08/2015 0705    INo results found for: SPEP, UPEP  Lab Results  Component Value Date   WBC 28.0* 03/08/2015   NEUTROABS 25.4* 03/08/2015   HGB 10.5* 03/08/2015   HCT 31.3* 03/08/2015   MCV 84.1 03/08/2015   PLT 394 03/08/2015      Chemistry      Component Value Date/Time   NA 136 02/09/2015 1011   NA 138 02/08/2015 0705   K 4.6 02/09/2015 1011   K 4.0 02/08/2015 0705   CL 102 02/08/2015 0705   CO2 25 02/09/2015 1011   CO2 27 02/08/2015 0705   BUN 18.8 02/09/2015 1011   BUN 22* 02/08/2015 0705   CREATININE 1.0 02/09/2015 1011   CREATININE 1.11  02/08/2015 0705      Component Value Date/Time   CALCIUM 8.9 02/09/2015 1011   CALCIUM 8.9 02/08/2015 0705   ALKPHOS 57 02/09/2015 1011   ALKPHOS 55 01/27/2015 0544   AST 14 02/09/2015 1011   AST 24 01/27/2015 0544   ALT 11 02/09/2015 1011   ALT 23 01/27/2015 0544   BILITOT 0.32 02/09/2015 1011   BILITOT 0.6 01/27/2015 0544  No results found for: LABCA2  No components found for: IFXGX271  No results for input(s): INR in the last 168 hours.  Urinalysis    Component Value Date/Time   COLORURINE YELLOW 01/23/2015 0805   APPEARANCEUR CLEAR 01/23/2015 0805   LABSPEC 1.014 01/23/2015 0805   PHURINE 7.5 01/23/2015 0805   GLUCOSEU NEGATIVE 01/23/2015 0805   HGBUR NEGATIVE 01/23/2015 0805   BILIRUBINUR NEGATIVE 01/23/2015 0805   KETONESUR NEGATIVE 01/23/2015 0805   PROTEINUR NEGATIVE 01/23/2015 0805   NITRITE NEGATIVE 01/23/2015 0805   LEUKOCYTESUR NEGATIVE 01/23/2015 0805    STUDIES: No results found.  ASSESSMENT: 65 y.o. [place] man with a diagnosis of mantle cell non-Hodgkin's lymphoma presenting with syncope secondary to bleeding from a large ulcerated duodenal ulcer  (a) s/p coil embolization of the feeding (gastro-duodenal) artery 01/23/2015  (b) anemia--scant iron stores on bone marrow biopsy--s/p feraheme 02/13/2015  (1) right inguinal lymph node biopsy 01/22/2015 confirms mantle cell lymphoma  (a) bone marrow biopsy 02/06/2015 positive for involvement by the patient's mantle cell lymphoma  (b) IPI score of 5 (high risk) predicts a 5 year progression free survival of 50% with CHOP-Rituxan chemotherapy  (c) MIPI score of 5 (intermediate risk) predicts a median survival of 58 months  (2) CHOP/Rituxan started 02/09/2015  (3)  Referral placed to [facility] transplant group for consideration of consolidative transplant in [place]  PLAN: [name] looks and feels great today. He has no complaints to offer. The labs were reviewed in detail and were stable.  His only  request is to lift the restrictions off his diet, specifically peanuts which are his favorite. Per Dr. Jana Hakim it should be ok for him to have peanuts as a snack. I cautioned him to be conservative up front to make sure they agree with his bowel.   [name] [name] return in 2 weeks for cycle 3 of RCHOP.  He understands and agrees with this plan. He has been encouraged to call with any issues that might arise before his next visit here.  Laurie Panda, NP   03/08/2015 1:52 PM

## 2015-03-21 ENCOUNTER — Telehealth: Payer: Self-pay | Admitting: *Deleted

## 2015-03-21 ENCOUNTER — Ambulatory Visit (HOSPITAL_COMMUNITY)
Admission: RE | Admit: 2015-03-21 | Discharge: 2015-03-21 | Disposition: A | Discharge status: Home / Self Care | Payer: BLUE CROSS/BLUE SHIELD | Source: Ambulatory Visit | Attending: Oncology | Admitting: Oncology

## 2015-03-21 ENCOUNTER — Encounter (HOSPITAL_COMMUNITY): Payer: Self-pay

## 2015-03-21 DIAGNOSIS — K276 Chronic or unspecified peptic ulcer, site unspecified, with both hemorrhage and perforation: Secondary | ICD-10-CM

## 2015-03-21 DIAGNOSIS — Z959 Presence of cardiac and vascular implant and graft, unspecified: Secondary | ICD-10-CM | POA: Diagnosis not present

## 2015-03-21 DIAGNOSIS — C8319 Mantle cell lymphoma, extranodal and solid organ sites: Secondary | ICD-10-CM | POA: Diagnosis present

## 2015-03-21 DIAGNOSIS — R590 Localized enlarged lymph nodes: Secondary | ICD-10-CM | POA: Insufficient documentation

## 2015-03-21 DIAGNOSIS — I82890 Acute embolism and thrombosis of other specified veins: Secondary | ICD-10-CM | POA: Insufficient documentation

## 2015-03-21 IMAGING — CT CT CHEST W/ CM
2 of 5 series · 15 of 46 positions shown, 17 images · IV contrast (OMNIPAQUE)
Comparison: [DATE] and previous

CLINICAL DATA: Mantle cell lymphoma of extranodal and solid organ
sites Bleeding ulcer with perforation Restaging Dx [DATE] chemo on
going trying to shrink abd/stomach mass before removing Surgery
hernia repair

EXAM:
CT CHEST, ABDOMEN, AND PELVIS WITH CONTRAST
TECHNIQUE: Multidetector CT imaging of the chest, abdomen and pelvis was
performed following the standard protocol during bolus
administration of intravenous contrast.
CONTRAST:  100mL OMNIPAQUE IOHEXOL 300 MG/ML  SOLN

[Series 2: cap with st · axial · 0.79mm/px · z∈[-630,-44]mm · 12 of 133 slices shown, 14 images]
[im 8/133  soft-tissue]
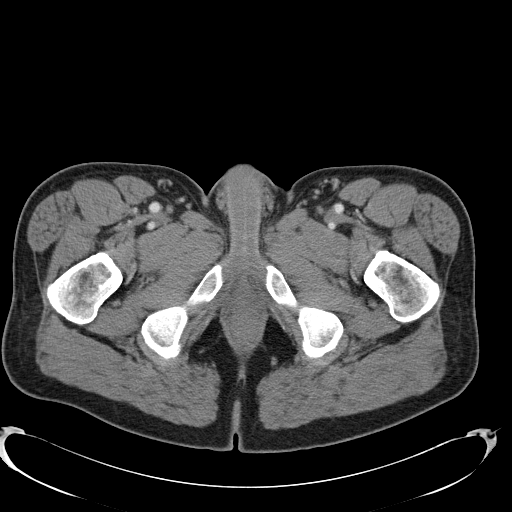
[im 8/133  bone]
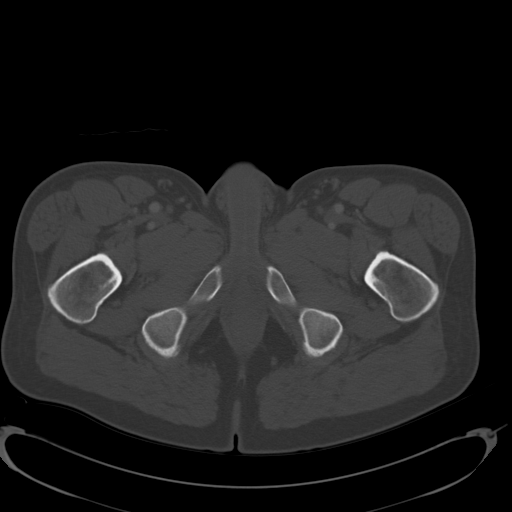
[im 23/133  soft-tissue]
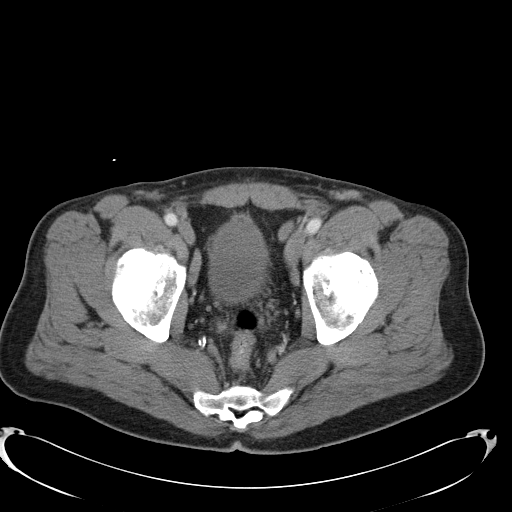
[im 30/133  soft-tissue]
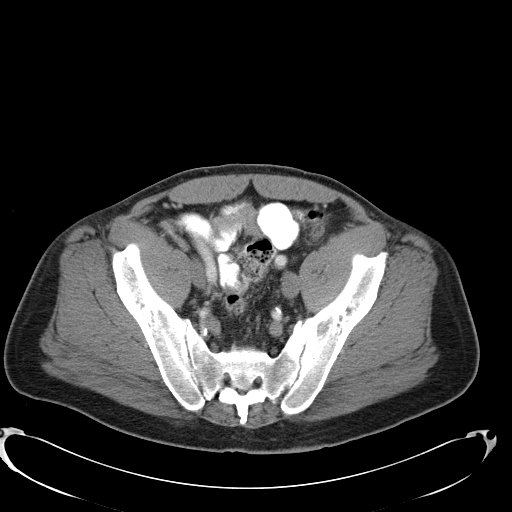
[im 37/133  soft-tissue]
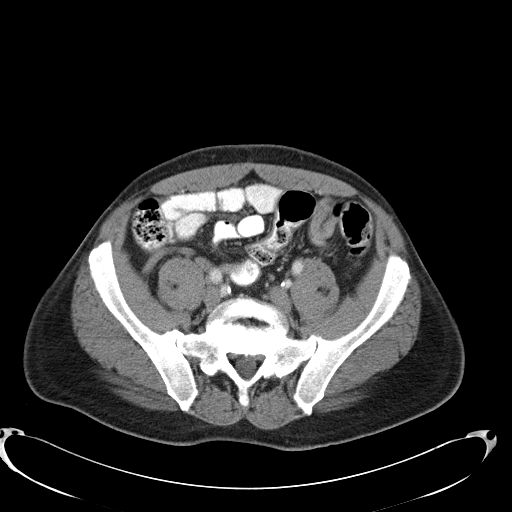
[im 52/133  soft-tissue]
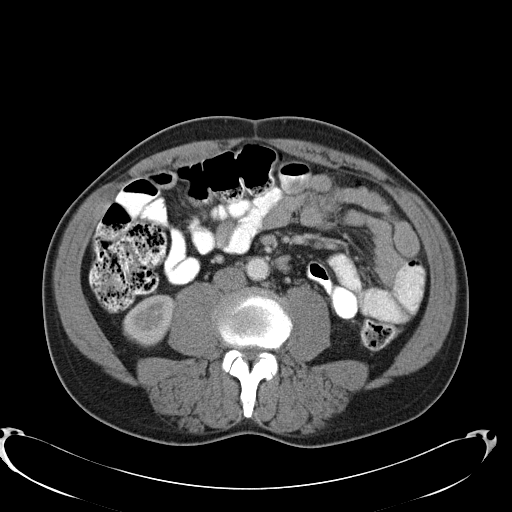
[im 59/133  soft-tissue]
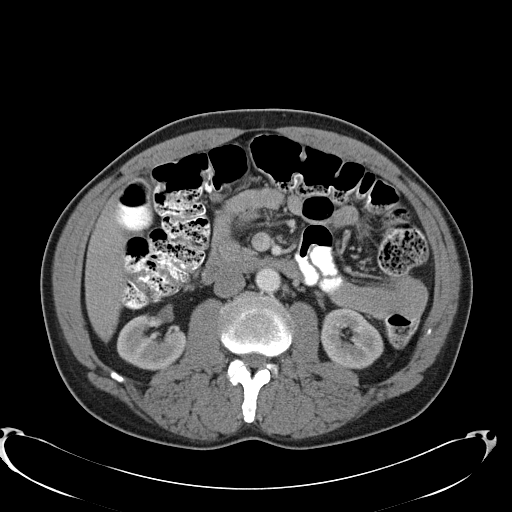
[im 74/133  soft-tissue]
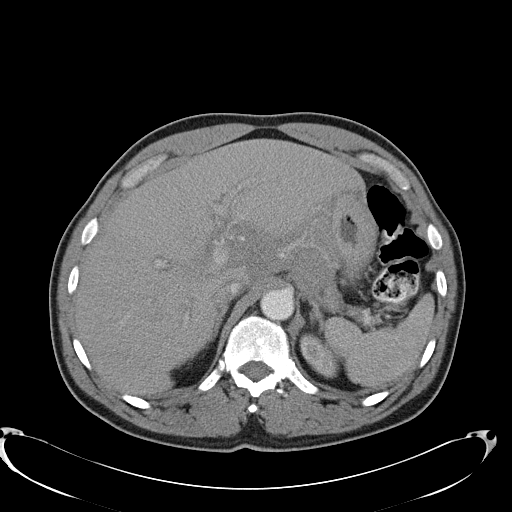
[im 81/133  soft-tissue]
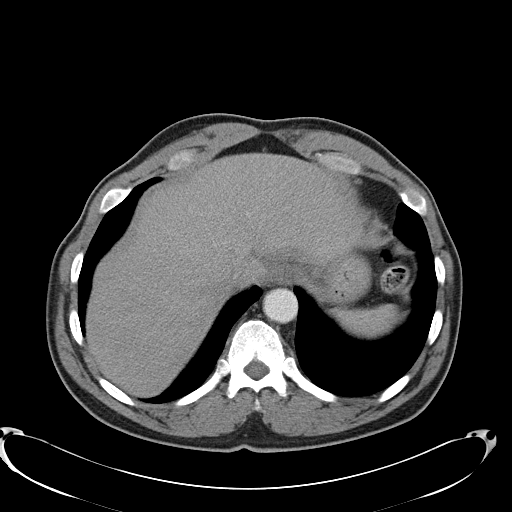
[im 96/133  soft-tissue]
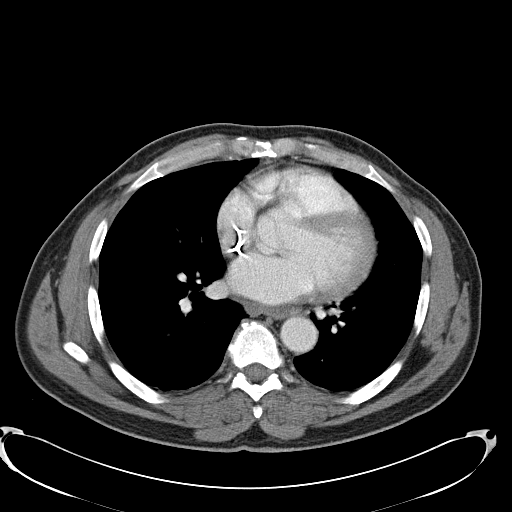
[im 96/133  bone]
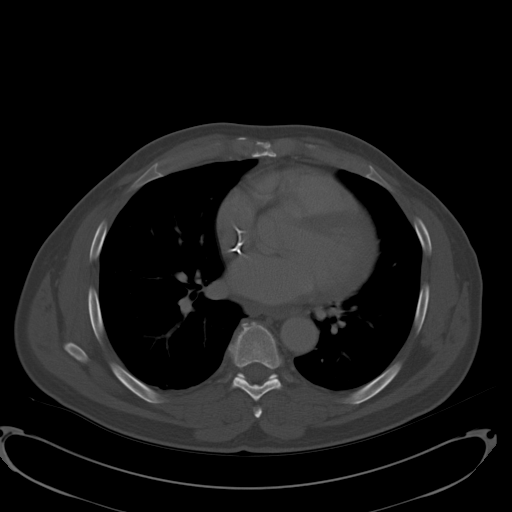
[im 103/133  soft-tissue]
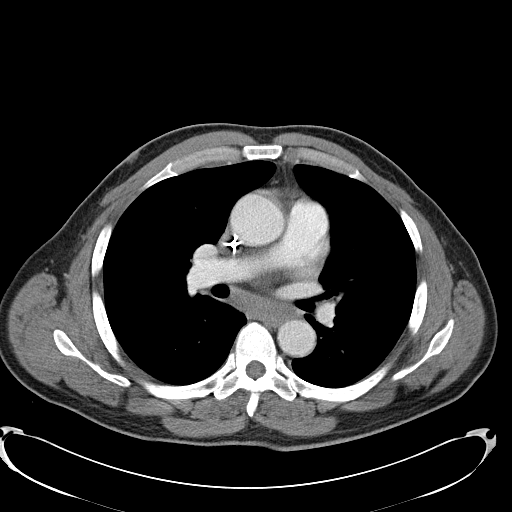
[im 111/133  soft-tissue]
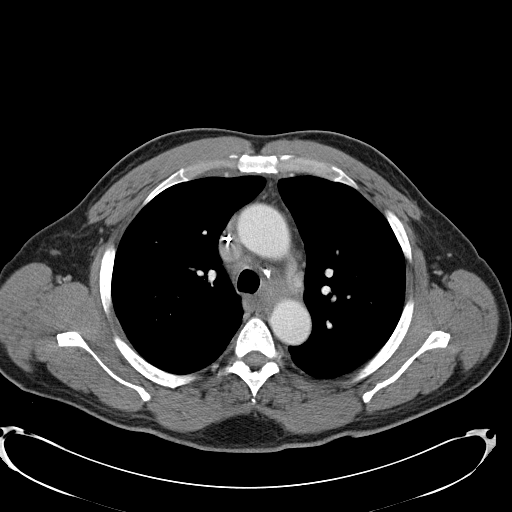
[im 125/133  soft-tissue]
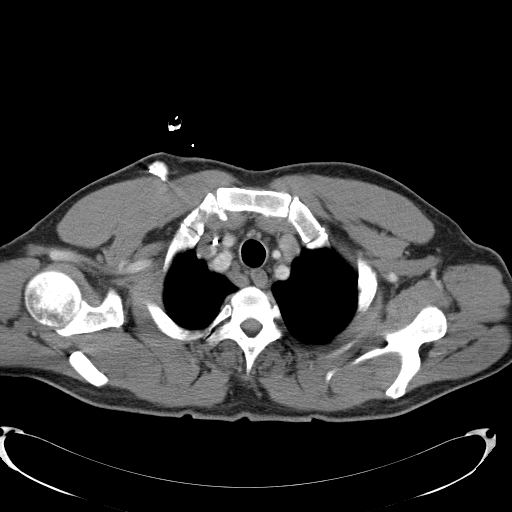

[Series 602: <mpr thick range> · coronal · 1.30mm/px · 3 of 92 slices shown]
[im 31/92  soft-tissue]
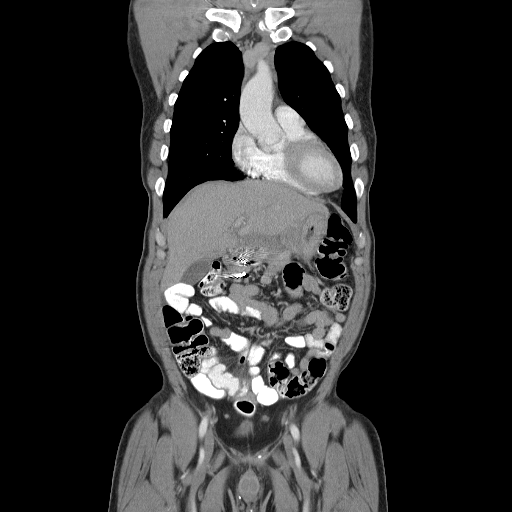
[im 41/92  soft-tissue]
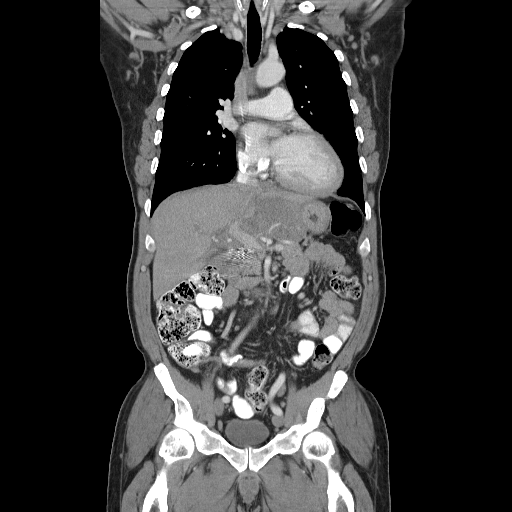
[im 51/92  soft-tissue]
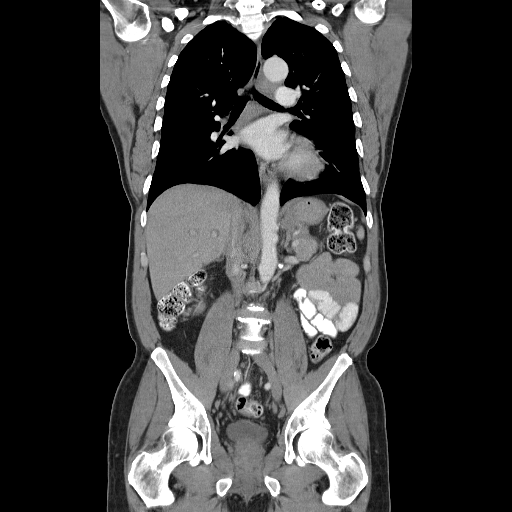

[15 of 46 positions shown; findings below may reference images not displayed]

FINDINGS: CT CHEST FINDINGS

Vascular: Interval placement of right IJ port catheter, tip proximal
right atrium. There is a small amount of nonocclusive thrombus in
the right IJ vein. Patchy coronary calcifications.

Mediastinum/Lymph Nodes: Interval decrease in size of the anterior
mediastinal mass 33 x 16 mm (previously 44 x 23). Mass anterior to
the right ventricle 69 x 17 mm, previously 877 x 40. Confluent sub
carinal adenopathy/ mass 47 x 23 mm, previously 75 x 28. There is
contiguous soft tissue extending into the AP window with similar
decrease in size. The bilateral axillary, pretracheal and right
paratracheal adenopathy has resolved. No hilar adenopathy.

Lungs/Pleura: No pulmonary mass, infiltrate, or effusion.

Musculoskeletal: No chest wall mass or suspicious bone lesions
identified.

CT ABDOMEN PELVIS FINDINGS

Hepatobiliary: Encasement of portal vein, hepatic artery, and CBD by
soft tissue mass, with some decrease in overall bulk since previous
exam. No intrahepatic lesion. No bile duct dilatation. Gallbladder
nondilated. Streak artifact from vascular embolization coils.

Pancreas: The above mass abuts the pancreatic head. There is no
pancreatic ductal dilatation.

Spleen: Within normal limits in size and appearance.

Adrenals/Urinary Tract: No masses identified. No evidence of
hydronephrosis.

Stomach/Bowel: The pylorus and proximal duodenum are contiguous with
the above mass. No evidence of obstruction. Distal small bowel and
colon are nondilated.

Vascular/Lymphatic: Right upper quadrant mass described above
encasing the celiac axis and extending across the midline to the
lesser curvature of the stomach, with overall decrease in mass
effect since prior scan of [DATE]. [DATE] mm left mesenteric node
image 64/2, previously 13 mm. Subcentimeter left para-aortic and
aortocaval nodes. Significant improvement in the bilateral pelvic
adenopathy. Index left external 18 mm node seen previously now 10
mm. Index right external iliac 18 mm noted seen previously now 10
mm. Patchy iliofemoral arterial calcifications without aneurysm.

Reproductive: Moderate prostatic enlargement.

Other: No ascites.  No free air.  Left pelvic phlebolith.

Musculoskeletal: No suspicious bone lesions identified. Degenerative
disc disease L5-S1.
IMPRESSION: 1. Significant interval improvement of mediastinal, axillary,
pelvic, and mesenteric adenopathy. No new or progressive adenopathy.
2. Significant decrease in size of bulky right upper abdominal mass
abutting the stomach, celiac axis, porta hepatis, and pancreatic
head.
3. Right IJ port catheter with some nonocclusive right IJ thrombus
noted.

## 2015-03-21 MED ORDER — IOHEXOL 300 MG/ML  SOLN
100.0000 mL | Freq: Once | INTRAMUSCULAR | Status: AC | PRN
  Administered 2015-03-21: 100 mL via INTRAVENOUS

## 2015-03-21 NOTE — Telephone Encounter (Signed)
[facility] radiology called about CT results from today. Results in EPIC. Note about thrombus in port. Patient seeing Nira Conn tomorrow with treatment.

## 2015-03-21 NOTE — Telephone Encounter (Signed)
"  There is a small amount of nonocclusive thrombus in the right IJ vein"  Gentry Fitz, NP is aware of thrombus.

## 2015-03-22 ENCOUNTER — Ambulatory Visit (HOSPITAL_BASED_OUTPATIENT_CLINIC_OR_DEPARTMENT_OTHER): Payer: BLUE CROSS/BLUE SHIELD | Admitting: Nurse Practitioner

## 2015-03-22 ENCOUNTER — Other Ambulatory Visit (HOSPITAL_BASED_OUTPATIENT_CLINIC_OR_DEPARTMENT_OTHER): Payer: BLUE CROSS/BLUE SHIELD

## 2015-03-22 ENCOUNTER — Encounter: Payer: Self-pay | Admitting: Nurse Practitioner

## 2015-03-22 ENCOUNTER — Other Ambulatory Visit: Payer: Self-pay

## 2015-03-22 ENCOUNTER — Ambulatory Visit: Payer: BLUE CROSS/BLUE SHIELD

## 2015-03-22 ENCOUNTER — Ambulatory Visit (HOSPITAL_BASED_OUTPATIENT_CLINIC_OR_DEPARTMENT_OTHER): Payer: BLUE CROSS/BLUE SHIELD

## 2015-03-22 ENCOUNTER — Other Ambulatory Visit: Payer: Self-pay | Admitting: Oncology

## 2015-03-22 VITALS — BP 120/89 | HR 80 | Temp 98.3°F | Resp 18 | Wt 200.8 lb

## 2015-03-22 VITALS — BP 125/60 | HR 115 | Temp 98.1°F | Resp 20

## 2015-03-22 DIAGNOSIS — Z5111 Encounter for antineoplastic chemotherapy: Secondary | ICD-10-CM

## 2015-03-22 DIAGNOSIS — C8315 Mantle cell lymphoma, lymph nodes of inguinal region and lower limb: Secondary | ICD-10-CM

## 2015-03-22 DIAGNOSIS — Z5112 Encounter for antineoplastic immunotherapy: Secondary | ICD-10-CM

## 2015-03-22 DIAGNOSIS — C8319 Mantle cell lymphoma, extranodal and solid organ sites: Secondary | ICD-10-CM

## 2015-03-22 DIAGNOSIS — K922 Gastrointestinal hemorrhage, unspecified: Secondary | ICD-10-CM

## 2015-03-22 LAB — COMPREHENSIVE METABOLIC PANEL
ALBUMIN: 3.9 g/dL (ref 3.5–5.0)
ALK PHOS: 72 U/L (ref 40–150)
ALT: 20 U/L (ref 0–55)
AST: 15 U/L (ref 5–34)
Anion Gap: 10 mEq/L (ref 3–11)
BUN: 16 mg/dL (ref 7.0–26.0)
CALCIUM: 9.5 mg/dL (ref 8.4–10.4)
CO2: 27 mEq/L (ref 22–29)
CREATININE: 1.1 mg/dL (ref 0.7–1.3)
Chloride: 102 mEq/L (ref 98–109)
EGFR: 87 mL/min/{1.73_m2} — ABNORMAL LOW (ref >90)
Glucose: 103 mg/dl (ref 70–140)
Potassium: 4.1 mEq/L (ref 3.5–5.1)
Sodium: 139 mEq/L (ref 136–145)
Total Bilirubin: 0.35 mg/dL (ref 0.20–1.20)
Total Protein: 7.5 g/dL (ref 6.4–8.3)

## 2015-03-22 LAB — CBC WITH DIFFERENTIAL/PLATELET
BASO%: 0.1 % (ref 0.0–2.0)
Basophils Absolute: 0 10*3/uL (ref 0.0–0.1)
EOS%: 0.1 % (ref 0.0–7.0)
Eosinophils Absolute: 0 10*3/uL (ref 0.0–0.5)
HEMATOCRIT: 32.9 % — AB (ref 38.4–49.9)
HEMOGLOBIN: 10.9 g/dL — AB (ref 13.0–17.1)
LYMPH#: 0.8 10*3/uL — AB (ref 0.9–3.3)
LYMPH%: 5.6 % — ABNORMAL LOW (ref 14.0–49.0)
MCH: 28.1 pg (ref 27.2–33.4)
MCHC: 33.1 g/dL (ref 32.0–36.0)
MCV: 84.8 fL (ref 79.3–98.0)
MONO#: 1.5 10*3/uL — AB (ref 0.1–0.9)
MONO%: 10.4 % (ref 0.0–14.0)
NEUT%: 83.8 % — AB (ref 39.0–75.0)
NEUTROS ABS: 12.2 10*3/uL — AB (ref 1.5–6.5)
Platelets: 320 10*3/uL (ref 140–400)
RBC: 3.88 10*6/uL — ABNORMAL LOW (ref 4.20–5.82)
RDW: 18 % — ABNORMAL HIGH (ref 11.0–14.6)
WBC: 14.6 10*3/uL — ABNORMAL HIGH (ref 4.0–10.3)
nRBC: 0 % (ref 0–0)

## 2015-03-22 MED ORDER — SODIUM CHLORIDE 0.9 % IV SOLN
Freq: Once | INTRAVENOUS | Status: AC
  Administered 2015-03-22: 11:00:00 via INTRAVENOUS
  Filled 2015-03-22: qty 8

## 2015-03-22 MED ORDER — HEPARIN SOD (PORK) LOCK FLUSH 100 UNIT/ML IV SOLN
500.0000 [IU] | Freq: Once | INTRAVENOUS | Status: AC | PRN
  Administered 2015-03-22: 500 [IU]
  Filled 2015-03-22: qty 5

## 2015-03-22 MED ORDER — VINCRISTINE SULFATE CHEMO INJECTION 1 MG/ML
2.0000 mg | Freq: Once | INTRAVENOUS | Status: AC
  Administered 2015-03-22: 2 mg via INTRAVENOUS
  Filled 2015-03-22: qty 2

## 2015-03-22 MED ORDER — DIPHENHYDRAMINE HCL 25 MG PO CAPS
25.0000 mg | ORAL_CAPSULE | Freq: Once | ORAL | Status: AC
  Administered 2015-03-22: 25 mg via ORAL

## 2015-03-22 MED ORDER — SODIUM CHLORIDE 0.9 % IJ SOLN
10.0000 mL | INTRAMUSCULAR | Status: DC | PRN
  Administered 2015-03-22: 10 mL
  Filled 2015-03-22: qty 10

## 2015-03-22 MED ORDER — DIPHENHYDRAMINE HCL 25 MG PO CAPS
ORAL_CAPSULE | ORAL | Status: AC
  Filled 2015-03-22: qty 1

## 2015-03-22 MED ORDER — PEGFILGRASTIM 6 MG/0.6ML ~~LOC~~ PSKT
6.0000 mg | PREFILLED_SYRINGE | Freq: Once | SUBCUTANEOUS | Status: AC
  Administered 2015-03-22: 6 mg via SUBCUTANEOUS
  Filled 2015-03-22: qty 0.6

## 2015-03-22 MED ORDER — ACETAMINOPHEN 325 MG PO TABS
ORAL_TABLET | ORAL | Status: AC
  Filled 2015-03-22: qty 2

## 2015-03-22 MED ORDER — SODIUM CHLORIDE 0.9 % IV SOLN
Freq: Once | INTRAVENOUS | Status: AC
  Administered 2015-03-22: 10:00:00 via INTRAVENOUS

## 2015-03-22 MED ORDER — SODIUM CHLORIDE 0.9 % IV SOLN
375.0000 mg/m2 | Freq: Once | INTRAVENOUS | Status: AC
  Administered 2015-03-22: 800 mg via INTRAVENOUS
  Filled 2015-03-22: qty 80

## 2015-03-22 MED ORDER — DOXORUBICIN HCL CHEMO IV INJECTION 2 MG/ML
50.0000 mg/m2 | Freq: Once | INTRAVENOUS | Status: AC
  Administered 2015-03-22: 110 mg via INTRAVENOUS
  Filled 2015-03-22: qty 55

## 2015-03-22 MED ORDER — ACETAMINOPHEN 325 MG PO TABS
650.0000 mg | ORAL_TABLET | Freq: Once | ORAL | Status: AC
  Administered 2015-03-22: 650 mg via ORAL

## 2015-03-22 MED ORDER — SODIUM CHLORIDE 0.9 % IV SOLN
750.0000 mg/m2 | Freq: Once | INTRAVENOUS | Status: AC
  Administered 2015-03-22: 1640 mg via INTRAVENOUS
  Filled 2015-03-22: qty 82

## 2015-03-22 NOTE — Patient Instructions (Signed)
[facility] Discharge Instructions for Patients Receiving Chemotherapy  Today you received the following chemotherapy agents: Rituxan, Adriamycin, Vincristine and Cytoxan.  To help prevent nausea and vomiting after your treatment, we encourage you to take your nausea medication:    If you develop nausea and vomiting that is not controlled by your nausea medication, call the clinic.   BELOW ARE SYMPTOMS THAT SHOULD BE REPORTED IMMEDIATELY:  *FEVER GREATER THAN 100.5 F  *CHILLS WITH OR WITHOUT FEVER  NAUSEA AND VOMITING THAT IS NOT CONTROLLED WITH YOUR NAUSEA MEDICATION  *UNUSUAL SHORTNESS OF BREATH  *UNUSUAL BRUISING OR BLEEDING  TENDERNESS IN MOUTH AND THROAT WITH OR WITHOUT PRESENCE OF ULCERS  *URINARY PROBLEMS  *BOWEL PROBLEMS  UNUSUAL RASH Items with * indicate a potential emergency and should be followed up as soon as possible.  Feel free to call the clinic you have any questions or concerns. The clinic phone number is [phone].  Please show the [place] at check-in to the Emergency Department and triage nurse. Appointment with Dr. Nadara Mustard at [facility] for transplant evaluation on 04-26-15 at 1045.

## 2015-03-22 NOTE — Progress Notes (Signed)
Writer called [facility] today per Dr. Jana Hakim to set up an appt with Dr. Berton Bon.  Appt scheduled for 04/26/15 at 10:45 for an evaluation for a transplant.  Paperwork will be forwarded to patient's home along with verification of appt and directions.

## 2015-03-22 NOTE — Progress Notes (Signed)
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 He "blew this off" and it did not happen again until 01/17/2015 when he felt weak at work and later fainted at home. He was brought to the ED where he was found to be guaiac positive (denies melena before that date) with a Hb of 4.5 MCV 82.2, platelets 295K, WBC 9.1 and normal INR and PTT. He was admitted and underwent EGD under Dr Michail Sermon 01/18/2015 showing a very large duodenal ulcer w clot. Abdominal US 01/19/2015 was not very informative but CT abd/pelvis 12/17,2016 showed adenopathy in the anterior mediastinum, retroperitoneum and pelvis, with a large ulcerated mass involving the distal stomach/proximal duodenum and extending into the gastrohepatic region.   On 01/22/2015 he underwent right inguinal lymph node biopsy, consistent with mantle cell lymphoma (([id number] and [id number])). Specifically the lymph node showed effacement of the architecture by sheets of small to medium sized lymphocytes with no apparent nodularity. The cells were positive for CD20, CD5, BCL-2, CD21, and cyclin D1. He 67 was low (10-20%). CD21 revealed a few residual germinal centers area at the B cells were negative for CD10, and BCL-6. CD23 was negative.  On 02/06/2015 the patient underwent  a bone marrow aspirate and biopsy. The pathology from this procedure (FZB 17-2 and 17-7) was positive for involvement by non-Hodgkin's lymphoma with numerous atypical interstitial and paratrabecular lymphoid aggregates consistent with mantle cell lymphoma. Iron stain showed scant iron flow cytometry confirmed a monoclonal B-cell population, lambda restricted, consistent with the patient's known diagnosis of mantle cell lymphoma.  On 01/18/2015 the patient underwent EGD which found a massive duodenal bulb ulcer with adherent clot, although no active bleeding.his hemoglobin was in the 8.4 range and stable. He was discharged on 01/22/2015, but within 24 hours was readmitted with syncope and altered mental status. He had aspirated and required intubation for his respiratory failure. Hemoglobin was down to 6.3. An NG tube was placed, the patient was multiply transfused, and Surgery was considered but felt not to be feasible.accordingly the patient underwent arteriography 01/23/2015 which found the superior mesenteric and celiac arteries not to be the involved vessels. The gastroduodenal artery showed multiple branches providing supply to the hypervascular duodenal mass, and this artery was coil embolized.. Also a port was placed at the time of this procedure with a view to eventual chemotherapy.  He was discharged 01/26/2015,but required readmission 02/07/2015 for another presyncopal episode. He presented to the emergency room where he was found to have a hemoglobin of 7.5. The patient was transfusedIIA hemoglobin of 10 and was discharged 02/07/2014. He was scheduled to start CHOP right toxin 02/08/2014.  His subsequent history is as detailed below.  INTERVAL HISTORY: [name] returns today for follow-up of his mantle cell lymphoma. Today is day 1, cycle 3 of 6 planned cycles of CHOP/rituximab, given every 3 weeks.  REVIEW OF SYSTEMS: [name] continues to  perform exceptionally well on treatment. He has few if any  side effects. He denies fevers, chills, nausea, vomiting, or changes in bowel or bladder habits. He is eating well. He has found the strength to increase his weights at the gym. He denies mouth sores, rashes, or neuropathy symptoms. A detailed review of systems is otherwise stable.    PAST MEDICAL HISTORY: Past Medical History  Diagnosis Date  . Essential hypertension   . HLD (hyperlipidemia)   . GERD (gastroesophageal reflux disease)   . Anemia   . Cancer ([place])     Lymphoma  . GI bleed   . Weakness     PAST SURGICAL HISTORY: Past Surgical History  Procedure Laterality Date  . Skin surgery      Small benign cysts over left scalp removed  . Esophagogastroduodenoscopy Left 01/18/2015    Procedure: ESOPHAGOGASTRODUODENOSCOPY (EGD);  Surgeon: Wilford Corner, MD;  Location: [facility] ENDOSCOPY;  Service: Endoscopy;  Laterality: Left;  . Inguinal hernia repair Right 01/22/2015    Procedure: RIGHT INGUINAL LYMPH NODE BX;  Surgeon: Donnie Mesa, MD;  Location: [facility];  Service: General;  Laterality: Right;  . Portacath placement  01/26/2015     power port with tip SVC/RA Junction    FAMILY HISTORY Family History  Problem Relation Age of Onset  . Hypertension Mother   . Hypertension Father   . Hypertension Sister   . Diabetes Sister   . Prostate cancer Brother   . Lupus Sister   . Kidney failure Father   The patient's father died from CHF complications age 74. The patient's mother is 30 y/o as of December 2016. The patient has 9 brothers, 8 sisters. One brother has prostate cancer. There is no other cancer history in the family to his knowledge.  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 His wife of 18 years, [name], is disabled due to RA. Daughtr [name] works as a Teacher, early years/pre for [employer] (she previously worked at the [employer] in records). Son [name] owns a window washing business. Son [name] is in Press photographer at [employer]. All live in [place].  The patient has 3 grandchildren. He attends a local [place] DIRECTIVES: Not in place   HEALTH MAINTENANCE: Social History  Substance Use Topics  . Smoking status: Never Smoker   . Smokeless tobacco: Never Used  . Alcohol Use: Yes     Comment: socially     Colonoscopy:  PSA: 2.27 on 01/22/2015  Hepatitis serologies (be surface antigen, hepatitis B and hepatitis see IgM) negative 01/18/2015  No Known Allergies  Current Outpatient Prescriptions  Medication Sig Dispense Refill  . allopurinol (ZYLOPRIM) 300 MG tablet Take 1 tablet (300 mg total) by mouth daily. 30 tablet 3  . lisinopril-hydrochlorothiazide (PRINZIDE,ZESTORETIC) 20-25 MG tablet Take 1 tablet by mouth daily.    Marland Kitchen LORazepam (ATIVAN) 0.5 MG tablet Take 1 tablet (0.5 mg total) by mouth every 6 (six) hours as needed (Nausea or vomiting). 30 tablet 0  . lovastatin (MEVACOR) 20 MG tablet Take 20 mg by mouth at bedtime.    Marland Kitchen omeprazole (PRILOSEC) 40 MG capsule Take 1 capsule (40 mg total) by mouth 2 (two) times daily. 60 capsule 0  . predniSONE (DELTASONE) 20 MG tablet Take 3 tablets a day ( in am ) with food x 5 days starting on day 1 of treatment plan 15 tablet 6  . sucralfate (CARAFATE) 1 g tablet  Take 1 tablet (1 g total) by mouth 4 (four) times daily -  with meals and at bedtime. 90 tablet 0  . ondansetron (ZOFRAN) 8 MG tablet Take 1 tablet (8 mg total) by mouth 2 (two) times daily. Start the day after chemo for 3 days. Then as needed for nausea or vomiting. (Patient not taking: Reported on 03/22/2015) 30 tablet 1  . prochlorperazine (COMPAZINE) 10 MG tablet Take 1 tablet (10 mg total) by mouth every 6 (six) hours as needed (Nausea or vomiting). (Patient not taking: Reported on 03/08/2015) 30 tablet 6   No current facility-administered medications for this visit.    OBJECTIVE: Middle-aged African-American man in no acute distress Filed Vitals:   03/22/15 0904  BP: 120/89  Pulse: 80  Temp:  98.3 F (36.8 C)  Resp: 18     Body mass index is 28.81 kg/(m^2).    ECOG FS:0 - Asymptomatic  Skin: warm, dry  HEENT: sclerae anicteric, conjunctivae pink, oropharynx clear. No thrush or mucositis.  Lymph Nodes: No cervical or supraclavicular lymphadenopathy  Lungs: clear to auscultation bilaterally, no rales, wheezes, or rhonci  Heart: regular rate and rhythm  Abdomen: round, soft, non tender, positive bowel sounds  Musculoskeletal: No focal spinal tenderness, no peripheral edema  Neuro: non focal, well oriented, positive affect    LAB RESULTS:  CMP     Component Value Date/Time   NA 138 03/08/2015 1302   NA 138 02/08/2015 0705   K 3.8 03/08/2015 1302   K 4.0 02/08/2015 0705   CL 102 02/08/2015 0705   CO2 27 03/08/2015 1302   CO2 27 02/08/2015 0705   GLUCOSE 107 03/08/2015 1302   GLUCOSE 115* 02/08/2015 0705   BUN 19.8 03/08/2015 1302   BUN 22* 02/08/2015 0705   CREATININE 1.0 03/08/2015 1302   CREATININE 1.11 02/08/2015 0705   CALCIUM 9.0 03/08/2015 1302   CALCIUM 8.9 02/08/2015 0705   PROT 6.6 03/08/2015 1302   PROT 5.4* 01/27/2015 0544   ALBUMIN 3.7 03/08/2015 1302   ALBUMIN 2.5* 01/27/2015 0544   AST 10 03/08/2015 1302   AST 24 01/27/2015 0544   ALT 12 03/08/2015 1302   ALT 23 01/27/2015 0544   ALKPHOS 140 03/08/2015 1302   ALKPHOS 55 01/27/2015 0544   BILITOT <0.30 03/08/2015 1302   BILITOT 0.6 01/27/2015 0544   GFRNONAA >60 02/08/2015 0705   GFRAA >60 02/08/2015 0705    INo results found for: SPEP, UPEP  Lab Results  Component Value Date   WBC 14.6* 03/22/2015   NEUTROABS 12.2* 03/22/2015   HGB 10.9* 03/22/2015   HCT 32.9* 03/22/2015   MCV 84.8 03/22/2015   PLT 320 03/22/2015      Chemistry      Component Value Date/Time   NA 138 03/08/2015 1302   NA 138 02/08/2015 0705   K 3.8 03/08/2015 1302   K 4.0 02/08/2015 0705   CL 102 02/08/2015 0705   CO2 27 03/08/2015 1302   CO2 27 02/08/2015 0705   BUN 19.8 03/08/2015 1302   BUN 22*  02/08/2015 0705   CREATININE 1.0 03/08/2015 1302   CREATININE 1.11 02/08/2015 0705      Component Value Date/Time   CALCIUM 9.0 03/08/2015 1302   CALCIUM 8.9 02/08/2015 0705   ALKPHOS 140 03/08/2015 1302   ALKPHOS 55 01/27/2015 0544   AST 10 03/08/2015 1302   AST 24 01/27/2015 0544   ALT 12 03/08/2015 1302   ALT 23 01/27/2015 0544   BILITOT <0.30 03/08/2015  1302   BILITOT 0.6 01/27/2015 0544       No results found for: LABCA2  No components found for: VHQIO962  No results for input(s): INR in the last 168 hours.  Urinalysis    Component Value Date/Time   COLORURINE YELLOW 01/23/2015 0805   APPEARANCEUR CLEAR 01/23/2015 0805   LABSPEC 1.014 01/23/2015 0805   PHURINE 7.5 01/23/2015 0805   GLUCOSEU NEGATIVE 01/23/2015 0805   HGBUR NEGATIVE 01/23/2015 0805   BILIRUBINUR NEGATIVE 01/23/2015 0805   KETONESUR NEGATIVE 01/23/2015 0805   PROTEINUR NEGATIVE 01/23/2015 0805   NITRITE NEGATIVE 01/23/2015 0805   LEUKOCYTESUR NEGATIVE 01/23/2015 0805    STUDIES: Ct Chest W Contrast  03/21/2015  CLINICAL DATA:  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 Bleeding ulcer with perforation Restaging Dx 01/2015 chemo on going trying to shrink abd/stomach mass before removing Surgery hernia repair EXAM: CT CHEST, ABDOMEN, AND PELVIS WITH CONTRAST TECHNIQUE: Multidetector CT imaging of the chest, abdomen and pelvis was performed following the standard protocol during bolus administration of intravenous contrast. CONTRAST:  142m OMNIPAQUE IOHEXOL 300 MG/ML  SOLN COMPARISON:  01/23/2015 and previous FINDINGS: CT CHEST FINDINGS Vascular: Interval placement of right IJ port catheter, tip proximal right atrium. There is a small amount of nonocclusive thrombus in the right IJ vein. Patchy coronary calcifications. Mediastinum/Lymph Nodes: Interval decrease in size of the anterior mediastinal mass 33 x 16 mm (previously 44 x 23). Mass anterior to the right ventricle 69 x 17 mm, previously  877 x 40. Confluent sub carinal adenopathy/ mass 47 x 23 mm, previously 75 x 28. There is contiguous soft tissue extending into the AP window with similar decrease in size. The bilateral axillary, pretracheal and right paratracheal adenopathy has resolved. No hilar adenopathy. Lungs/Pleura: No pulmonary mass, infiltrate, or effusion. Musculoskeletal: No chest wall mass or suspicious bone lesions identified. CT ABDOMEN PELVIS FINDINGS Hepatobiliary: Encasement of portal vein, hepatic artery, and CBD by soft tissue mass, with some decrease in overall bulk since previous exam. No intrahepatic lesion. No bile duct dilatation. Gallbladder nondilated. Streak artifact from vascular embolization coils. Pancreas: The above mass abuts the pancreatic head. There is no pancreatic ductal dilatation. Spleen: Within normal limits in size and appearance. Adrenals/Urinary Tract: No masses identified. No evidence of hydronephrosis. Stomach/Bowel: The pylorus and proximal duodenum are contiguous with the above mass. No evidence of obstruction. Distal small bowel and colon are nondilated. Vascular/Lymphatic: Right upper quadrant mass described above encasing the celiac axis and extending across the midline to the lesser curvature of the stomach, with overall decrease in mass effect since prior scan of 01/20/2015. 8 mm left mesenteric node image 64/2, previously 13 mm. Subcentimeter left para-aortic and aortocaval nodes. Significant improvement in the bilateral pelvic adenopathy. Index left external 18 mm node seen previously now 10 mm. Index right external iliac 18 mm noted seen previously now 10 mm. Patchy iliofemoral arterial calcifications without aneurysm. Reproductive: Moderate prostatic enlargement. Other: No ascites.  No free air.  Left pelvic phlebolith. Musculoskeletal: No suspicious bone lesions identified. Degenerative disc disease L5-S1. IMPRESSION: 1. Significant interval improvement of mediastinal, axillary, pelvic, and  mesenteric adenopathy. No new or progressive adenopathy. 2. Significant decrease in size of bulky right upper abdominal mass abutting the stomach, celiac axis, porta hepatis, and pancreatic head. 3. Right IJ port catheter with some nonocclusive right IJ thrombus noted. Electronically Signed   By: DLucrezia EuropeM.D.   On: 03/21/2015 10:00   Ct Abdomen Pelvis W Contrast  03/21/2015  CLINICAL DATA:  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 Bleeding ulcer with perforation Restaging Dx 01/2015 chemo on going trying to shrink abd/stomach mass before removing Surgery hernia repair EXAM: CT CHEST, ABDOMEN, AND PELVIS WITH CONTRAST TECHNIQUE: Multidetector CT imaging of the chest, abdomen and pelvis was performed following the standard protocol during bolus administration of intravenous contrast. CONTRAST:  162m OMNIPAQUE IOHEXOL 300 MG/ML  SOLN COMPARISON:  01/23/2015 and previous FINDINGS: CT CHEST FINDINGS Vascular: Interval placement of right IJ port catheter, tip proximal right atrium. There is a small amount of nonocclusive thrombus in the right IJ vein. Patchy coronary calcifications. Mediastinum/Lymph Nodes: Interval decrease in size of the anterior mediastinal mass 33 x 16 mm (previously 44 x 23). Mass anterior to the right ventricle 69 x 17 mm, previously 877 x 40. Confluent sub carinal adenopathy/ mass 47 x 23 mm, previously 75 x 28. There is contiguous soft tissue extending into the AP window with similar decrease in size. The bilateral axillary, pretracheal and right paratracheal adenopathy has resolved. No hilar adenopathy. Lungs/Pleura: No pulmonary mass, infiltrate, or effusion. Musculoskeletal: No chest wall mass or suspicious bone lesions identified. CT ABDOMEN PELVIS FINDINGS Hepatobiliary: Encasement of portal vein, hepatic artery, and CBD by soft tissue mass, with some decrease in overall bulk since previous exam. No intrahepatic lesion. No bile duct dilatation. Gallbladder nondilated.  Streak artifact from vascular embolization coils. Pancreas: The above mass abuts the pancreatic head. There is no pancreatic ductal dilatation. Spleen: Within normal limits in size and appearance. Adrenals/Urinary Tract: No masses identified. No evidence of hydronephrosis. Stomach/Bowel: The pylorus and proximal duodenum are contiguous with the above mass. No evidence of obstruction. Distal small bowel and colon are nondilated. Vascular/Lymphatic: Right upper quadrant mass described above encasing the celiac axis and extending across the midline to the lesser curvature of the stomach, with overall decrease in mass effect since prior scan of 01/20/2015. 8 mm left mesenteric node image 64/2, previously 13 mm. Subcentimeter left para-aortic and aortocaval nodes. Significant improvement in the bilateral pelvic adenopathy. Index left external 18 mm node seen previously now 10 mm. Index right external iliac 18 mm noted seen previously now 10 mm. Patchy iliofemoral arterial calcifications without aneurysm. Reproductive: Moderate prostatic enlargement. Other: No ascites.  No free air.  Left pelvic phlebolith. Musculoskeletal: No suspicious bone lesions identified. Degenerative disc disease L5-S1. IMPRESSION: 1. Significant interval improvement of mediastinal, axillary, pelvic, and mesenteric adenopathy. No new or progressive adenopathy. 2. Significant decrease in size of bulky right upper abdominal mass abutting the stomach, celiac axis, porta hepatis, and pancreatic head. 3. Right IJ port catheter with some nonocclusive right IJ thrombus noted. Electronically Signed   By: DLucrezia EuropeM.D.   On: 03/21/2015 10:00    ASSESSMENT: 65y.o. [place] man with a diagnosis of mantle cell non-Hodgkin's lymphoma presenting with syncope secondary to bleeding from a large ulcerated duodenal ulcer  (a) s/p coil embolization of the feeding (gastro-duodenal) artery 01/23/2015  (b) anemia--scant iron stores on bone marrow biopsy--s/p  feraheme 02/13/2015  (1) right inguinal lymph node biopsy 01/22/2015 confirms mantle cell lymphoma  (a) bone marrow biopsy 02/06/2015 positive for involvement by the patient's mantle cell lymphoma  (b) IPI score of 5 (high risk) predicts a 5 year progression free survival of 50% with CHOP-Rituxan chemotherapy  (c) MIPI score of 5 (intermediate risk) predicts a median survival of 58 months  (2) CHOP/Rituxan started 02/09/2015  (3)  [facility]  of consolidative transplant in [place]  PLAN: Dr. Jana Hakim reviewed the results of the scan with [name]. There was significant interval improvement of several areas of adenopathy, with no new areas of concern. The bulky upper abdominal mass is also decreased in size. Dr. Jana Hakim debated on whether or not to intensify treatment at this point, but for now we [name] continue with more cycles of the current treatment. We have asked him to get contact with Dr. Nadara Mustard at [facility]. We are anticipating a transplant sometime in the future.   [name] has asked to return to work after his short term disability is up on 3/8. 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 Dr. Jana Hakim [name] allow him to do so on the condition that he has a thermometer on the road with him at all time, and that he [name] call if he falls ill or develops a fever. He understands that he may need to proceed to the nearest emergency department in this case.   The labs were reviewed in detail and were stable. He [name] proceed with cycle 3 of RCHOP as planned today.  [name] [name] return in 2 weeks for cycle 4 of RCHOP.  He understands and agrees with this plan. He has been encouraged to call with any issues that might arise before his next visit here.  Laurie Panda, NP   03/22/2015 9:27 AM    ADDENDUM: [name] has had a significant response to his first 2 cycles of R-CHOP. There is data suggesting  Ara-C based treatments can improve results of eventual transplant and we can certainly alternate DHAP with CHOP but I would like to do this in coordination with his transplant team. He was initially evaluated at [facility] (where they suggested bendamustine/rituximab) but insurance [name] not support Rx there. We [name] refer him to [facility] and continue induction treatment with R-CHOP for now.  I personally saw this patient and performed a substantive portion of this encounter with the listed APP documented above.   Chauncey Cruel, MD Medical Oncology and Hematology [facility] [address] [place], [place] [address] Tel. [phone]    Fax. [phone]

## 2015-03-23 ENCOUNTER — Telehealth: Payer: Self-pay

## 2015-03-23 NOTE — Telephone Encounter (Signed)
Per Dr. Jana Hakim patient will need to be set up for a transplant informational appt.  Writer discovered that [employer] will only cover him if he goes to [facility] or [facility].  Dr. Jana Hakim would like him to see Dr. Renetta Chalk at [facility] or someone in that practice.  Writer called and LVM on Dr. Octaviano Batty nurse navigators line- Rosealee Albee, who is away until Monday 03/26/15 .  Writer requested that she call back on Monday to schedule an appt in the next 2-3 weeks.  Their number is [phone].  Patient is aware and will wait to hear back from [place].  Patient is requesting a  "back to work letter" which Probation officer will provide him.

## 2015-03-27 NOTE — Telephone Encounter (Signed)
This RN received VM from Jodi Geralds at [facility]. Per message Inez Catalina requested New Patient Coordinator - Mimi to be contacted at [phone].  This request will be forwarded to Blima Singer in HIM for appointment and appropriate coordination for records.

## 2015-03-28 ENCOUNTER — Telehealth: Payer: Self-pay | Admitting: Oncology

## 2015-03-28 ENCOUNTER — Telehealth: Payer: Self-pay

## 2015-03-28 NOTE — Telephone Encounter (Signed)
Faxed medical records to [facility] [id number] release id

## 2015-03-28 NOTE — Telephone Encounter (Signed)
Mimi called from [facility] requesting faxed form to be filled out and faxed as well as patient's medical record be faxed.  Writer filled out initial information form and Nicki in HIM faxed medical records.  Writer called Mimi back to inform her that everything requested was faxed to her.

## 2015-04-12 ENCOUNTER — Ambulatory Visit (HOSPITAL_BASED_OUTPATIENT_CLINIC_OR_DEPARTMENT_OTHER): Payer: BLUE CROSS/BLUE SHIELD

## 2015-04-12 ENCOUNTER — Encounter: Payer: Self-pay | Admitting: Nurse Practitioner

## 2015-04-12 ENCOUNTER — Other Ambulatory Visit (HOSPITAL_BASED_OUTPATIENT_CLINIC_OR_DEPARTMENT_OTHER): Payer: BLUE CROSS/BLUE SHIELD

## 2015-04-12 ENCOUNTER — Ambulatory Visit (HOSPITAL_BASED_OUTPATIENT_CLINIC_OR_DEPARTMENT_OTHER): Payer: BLUE CROSS/BLUE SHIELD | Admitting: Nurse Practitioner

## 2015-04-12 ENCOUNTER — Other Ambulatory Visit: Payer: BLUE CROSS/BLUE SHIELD

## 2015-04-12 ENCOUNTER — Other Ambulatory Visit: Payer: Self-pay

## 2015-04-12 VITALS — BP 108/64 | HR 80 | Temp 98.0°F | Resp 18

## 2015-04-12 VITALS — BP 138/84 | HR 82 | Temp 98.2°F | Resp 18 | Ht 70.0 in | Wt 205.2 lb

## 2015-04-12 DIAGNOSIS — Z5111 Encounter for antineoplastic chemotherapy: Secondary | ICD-10-CM

## 2015-04-12 DIAGNOSIS — C8315 Mantle cell lymphoma, lymph nodes of inguinal region and lower limb: Secondary | ICD-10-CM | POA: Diagnosis not present

## 2015-04-12 DIAGNOSIS — Z5112 Encounter for antineoplastic immunotherapy: Secondary | ICD-10-CM | POA: Diagnosis not present

## 2015-04-12 DIAGNOSIS — C8319 Mantle cell lymphoma, extranodal and solid organ sites: Secondary | ICD-10-CM

## 2015-04-12 DIAGNOSIS — D649 Anemia, unspecified: Secondary | ICD-10-CM | POA: Diagnosis not present

## 2015-04-12 DIAGNOSIS — K922 Gastrointestinal hemorrhage, unspecified: Secondary | ICD-10-CM

## 2015-04-12 LAB — COMPREHENSIVE METABOLIC PANEL
ALBUMIN: 3.7 g/dL (ref 3.5–5.0)
ALK PHOS: 64 U/L (ref 40–150)
ALT: 35 U/L (ref 0–55)
ANION GAP: 10 meq/L (ref 3–11)
AST: 24 U/L (ref 5–34)
BUN: 16.3 mg/dL (ref 7.0–26.0)
CALCIUM: 9 mg/dL (ref 8.4–10.4)
CO2: 23 mEq/L (ref 22–29)
CREATININE: 1 mg/dL (ref 0.7–1.3)
Chloride: 106 mEq/L (ref 98–109)
Glucose: 127 mg/dl (ref 70–140)
Potassium: 4 mEq/L (ref 3.5–5.1)
Sodium: 140 mEq/L (ref 136–145)
TOTAL PROTEIN: 6.9 g/dL (ref 6.4–8.3)

## 2015-04-12 LAB — CBC WITH DIFFERENTIAL/PLATELET
BASO%: 0.7 % (ref 0.0–2.0)
Basophils Absolute: 0 10*3/uL (ref 0.0–0.1)
EOS ABS: 0 10*3/uL (ref 0.0–0.5)
EOS%: 0.5 % (ref 0.0–7.0)
HEMATOCRIT: 33.1 % — AB (ref 38.4–49.9)
HEMOGLOBIN: 11.3 g/dL — AB (ref 13.0–17.1)
LYMPH#: 0.7 10*3/uL — AB (ref 0.9–3.3)
LYMPH%: 9.8 % — ABNORMAL LOW (ref 14.0–49.0)
MCH: 29.1 pg (ref 27.2–33.4)
MCHC: 34.2 g/dL (ref 32.0–36.0)
MCV: 85.1 fL (ref 79.3–98.0)
MONO#: 0.7 10*3/uL (ref 0.1–0.9)
MONO%: 9.6 % (ref 0.0–14.0)
NEUT%: 79.4 % — ABNORMAL HIGH (ref 39.0–75.0)
NEUTROS ABS: 5.5 10*3/uL (ref 1.5–6.5)
PLATELETS: 422 10*3/uL — AB (ref 140–400)
RBC: 3.89 10*6/uL — ABNORMAL LOW (ref 4.20–5.82)
RDW: 19.1 % — AB (ref 11.0–14.6)
WBC: 6.9 10*3/uL (ref 4.0–10.3)

## 2015-04-12 MED ORDER — DIPHENHYDRAMINE HCL 25 MG PO CAPS
25.0000 mg | ORAL_CAPSULE | Freq: Once | ORAL | Status: AC
  Administered 2015-04-12: 25 mg via ORAL

## 2015-04-12 MED ORDER — SUCRALFATE 1 G PO TABS: 1.0000 g | ORAL_TABLET | Freq: Three times a day (TID) | ORAL | Status: DC

## 2015-04-12 MED ORDER — SODIUM CHLORIDE 0.9 % IV SOLN
750.0000 mg/m2 | Freq: Once | INTRAVENOUS | Status: AC
  Administered 2015-04-12: 1640 mg via INTRAVENOUS
  Filled 2015-04-12: qty 82

## 2015-04-12 MED ORDER — SODIUM CHLORIDE 0.9 % IV SOLN
375.0000 mg/m2 | Freq: Once | INTRAVENOUS | Status: AC
  Administered 2015-04-12: 800 mg via INTRAVENOUS
  Filled 2015-04-12: qty 70

## 2015-04-12 MED ORDER — HEPARIN SOD (PORK) LOCK FLUSH 100 UNIT/ML IV SOLN
500.0000 [IU] | Freq: Once | INTRAVENOUS | Status: AC | PRN
  Administered 2015-04-12: 500 [IU]
  Filled 2015-04-12: qty 5

## 2015-04-12 MED ORDER — DIPHENHYDRAMINE HCL 25 MG PO CAPS
ORAL_CAPSULE | ORAL | Status: AC
  Filled 2015-04-12: qty 1

## 2015-04-12 MED ORDER — SODIUM CHLORIDE 0.9 % IJ SOLN
10.0000 mL | INTRAMUSCULAR | Status: DC | PRN
  Administered 2015-04-12: 10 mL
  Filled 2015-04-12: qty 10

## 2015-04-12 MED ORDER — SODIUM CHLORIDE 0.9 % IV SOLN
Freq: Once | INTRAVENOUS | Status: AC
  Administered 2015-04-12: 10:00:00 via INTRAVENOUS
  Filled 2015-04-12: qty 8

## 2015-04-12 MED ORDER — DOXORUBICIN HCL CHEMO IV INJECTION 2 MG/ML
50.0000 mg/m2 | Freq: Once | INTRAVENOUS | Status: AC
  Administered 2015-04-12: 110 mg via INTRAVENOUS
  Filled 2015-04-12: qty 55

## 2015-04-12 MED ORDER — VINCRISTINE SULFATE CHEMO INJECTION 1 MG/ML
2.0000 mg | Freq: Once | INTRAVENOUS | Status: AC
  Administered 2015-04-12: 2 mg via INTRAVENOUS
  Filled 2015-04-12: qty 2

## 2015-04-12 MED ORDER — ACETAMINOPHEN 325 MG PO TABS
ORAL_TABLET | ORAL | Status: AC
  Filled 2015-04-12: qty 2

## 2015-04-12 MED ORDER — SODIUM CHLORIDE 0.9 % IV SOLN
Freq: Once | INTRAVENOUS | Status: AC
  Administered 2015-04-12: 10:00:00 via INTRAVENOUS

## 2015-04-12 MED ORDER — PEGFILGRASTIM 6 MG/0.6ML ~~LOC~~ PSKT
6.0000 mg | PREFILLED_SYRINGE | Freq: Once | SUBCUTANEOUS | Status: AC
  Administered 2015-04-12: 6 mg via SUBCUTANEOUS
  Filled 2015-04-12: qty 0.6

## 2015-04-12 MED ORDER — ACETAMINOPHEN 325 MG PO TABS
650.0000 mg | ORAL_TABLET | Freq: Once | ORAL | Status: AC
  Administered 2015-04-12: 650 mg via ORAL

## 2015-04-12 NOTE — Progress Notes (Signed)
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 He "blew this off" and it did not happen again until 01/17/2015 when he felt weak at work and later fainted at home. He was brought to the ED where he was found to be guaiac positive (denies melena before that date) with a Hb of 4.5 MCV 82.2, platelets 295K, WBC 9.1 and normal INR and PTT. He was admitted and underwent EGD under Dr Michail Sermon 01/18/2015 showing a very large duodenal ulcer w clot. Abdominal US 01/19/2015 was not very informative but CT abd/pelvis 12/17,2016 showed adenopathy in the anterior mediastinum, retroperitoneum and pelvis, with a large ulcerated mass involving the distal stomach/proximal duodenum and extending into the gastrohepatic region.   On 01/22/2015 he underwent right inguinal lymph node biopsy, consistent with mantle cell lymphoma (([id number] and FZB [id number])). Specifically the lymph node showed effacement of the architecture by sheets of small to medium sized lymphocytes with no apparent nodularity. The cells were positive for CD20, CD5, BCL-2, CD21, and cyclin D1. He 67 was low (10-20%). CD21 revealed a few residual germinal centers area at the B cells were negative for CD10, and BCL-6. CD23 was negative.  On 02/06/2015 the patient underwent  a bone marrow aspirate and biopsy. The pathology from this procedure (FZB 17-2 and 17-7) was positive for involvement by non-Hodgkin's lymphoma with numerous atypical interstitial and paratrabecular lymphoid aggregates consistent with mantle cell lymphoma. Iron stain showed scant iron flow cytometry confirmed a monoclonal B-cell population, lambda restricted, consistent with the patient's known diagnosis of mantle cell lymphoma.  On 01/18/2015 the patient underwent EGD which found a massive duodenal bulb ulcer with adherent clot, although no active bleeding.his hemoglobin was in the 8.4 range and stable. He was discharged on 01/22/2015, but within 24 hours was readmitted with syncope and altered mental status. He had aspirated and required intubation for his respiratory failure. Hemoglobin was down to 6.3. An NG tube was placed, the patient was multiply transfused, and Surgery was considered but felt not to be feasible.accordingly the patient underwent arteriography 01/23/2015 which found the superior mesenteric and celiac arteries not to be the involved vessels. The gastroduodenal artery showed multiple branches providing supply to the hypervascular duodenal mass, and this artery was coil embolized.. Also a port was placed at the time of this procedure with a view to eventual chemotherapy.  He was discharged 01/26/2015,but required readmission 02/07/2015 for another presyncopal episode. He presented to the emergency room where he was found to have a hemoglobin of 7.5. The patient was transfusedIIA hemoglobin of 10 and was discharged 02/07/2014. He was scheduled to start CHOP right toxin 02/08/2014.  His subsequent history is as detailed below.  INTERVAL HISTORY: Will returns today for follow-up of his mantle cell lymphoma. Today is day 1, cycle 4 of 6 planned cycles of CHOP/rituximab, given every 3 weeks. The interval history is remarkable for a  consultation visit at [facility] to discuss a transplant. He  respectfully listened to the proposal, but for now he has decided that this is not something that he is interested.   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 He does have a partner with him on the road, so he feels safe. He is eating well and is back to his baseline weight of 205. He denies pain. He has no fevers, chills, nausea, vomiting, or changes in bowel or bladder habits. He has never had to take any PRN antiemetics. He works out and lifts weight at [employer]. He denies mouth sores, rashes, or neuropathy symptoms. A detailed review of systems is otherwise stable.    PAST MEDICAL HISTORY: Past Medical History  Diagnosis Date  . Essential hypertension   . HLD (hyperlipidemia)   . GERD (gastroesophageal reflux disease)   . Anemia   . Cancer ([place])     Lymphoma  . GI bleed   . Weakness     PAST SURGICAL HISTORY: Past Surgical History  Procedure Laterality Date  . Skin surgery      Small benign cysts over left scalp removed  . Esophagogastroduodenoscopy Left 01/18/2015    Procedure: ESOPHAGOGASTRODUODENOSCOPY (EGD);  Surgeon: Wilford Corner, MD;  Location: [facility] ENDOSCOPY;  Service: Endoscopy;  Laterality: Left;  . Inguinal hernia repair Right 01/22/2015    Procedure: RIGHT INGUINAL LYMPH NODE BX;  Surgeon: Donnie Mesa, MD;  Location: [facility];  Service: General;  Laterality: Right;  . Portacath placement  01/26/2015     power port with tip SVC/RA [place]    FAMILY HISTORY Family History  Problem Relation Age of Onset  . Hypertension Mother   . Hypertension Father   . Hypertension Sister   . Diabetes Sister   . Prostate cancer Brother   . Lupus Sister   . Kidney failure Father   The patient's father died from CHF complications age 30. The patient's mother is 89 y/o as of December 2016. The patient has 9 brothers, 8 sisters. One brother has prostate cancer. There is no other cancer history in the family to his knowledge.  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 His wife of 45 years, [name], is disabled due to RA. Daughtr [name] works as a Teacher, early years/pre for [employer] (she previously worked at the [employer] in records). Son [name] owns a window washing business. Son [name] is in Press photographer at [employer]. All live in [place]. The patient has 3 grandchildren. He attends a local [facility] DIRECTIVES: Not in place   HEALTH MAINTENANCE: Social History  Substance Use Topics  . Smoking status: Never Smoker   . Smokeless tobacco: Never Used  . Alcohol Use: Yes     Comment: socially     Colonoscopy:  PSA: 2.27 on 01/22/2015  Hepatitis serologies (be surface antigen, hepatitis B and hepatitis see IgM) negative 01/18/2015  No Known Allergies  Current Outpatient Prescriptions  Medication Sig Dispense Refill  . allopurinol (ZYLOPRIM) 300 MG tablet Take 1 tablet (300 mg total) by mouth daily. 30 tablet 3  . lisinopril-hydrochlorothiazide (PRINZIDE,ZESTORETIC) 20-25 MG tablet Take 1 tablet by mouth daily.    Marland Kitchen LORazepam (ATIVAN) 0.5 MG tablet Take 1 tablet (0.5 mg total) by mouth every 6 (six) hours as needed (Nausea or vomiting). 30 tablet 0  . lovastatin (MEVACOR) 20 MG tablet  Take 20 mg by mouth at bedtime.    Marland Kitchen omeprazole (PRILOSEC) 40 MG capsule Take 1 capsule (40 mg total) by mouth 2 (two) times daily. 60 capsule 0  . predniSONE (DELTASONE) 20 MG tablet Take 3 tablets a day ( in am ) with food x 5 days starting on day 1 of treatment plan 15 tablet 6  . sucralfate (CARAFATE) 1 g tablet Take 1 tablet (1 g total) by mouth 4 (four) times daily -  with meals and at bedtime. 90 tablet 0  . ondansetron (ZOFRAN) 8 MG tablet Take 1 tablet (8 mg total) by mouth 2 (two) times daily. Start the day after chemo for 3 days. Then as needed for nausea or vomiting. (Patient not taking: Reported on 03/22/2015) 30 tablet 1  . prochlorperazine (COMPAZINE) 10 MG tablet  Take 1 tablet (10 mg total) by mouth every 6 (six) hours as needed (Nausea or vomiting). (Patient not taking: Reported on 03/08/2015) 30 tablet 6   No current facility-administered medications for this visit.    OBJECTIVE: Middle-aged African-American man in no acute distress Filed Vitals:   04/12/15 0834  BP: 138/84  Pulse: 82  Temp: 98.2 F (36.8 C)  Resp: 18     Body mass index is 29.44 kg/(m^2).    ECOG FS:0 - Asymptomatic  Sclerae unicteric, pupils round and equal Oropharynx clear and moist-- no thrush or other lesions No cervical or supraclavicular adenopathy Lungs no rales or rhonchi Heart regular rate and rhythm Abd soft, nontender, positive bowel sounds MSK no focal spinal tenderness, no upper extremity lymphedema Neuro: nonfocal, well oriented, appropriate affect   LAB RESULTS:  CMP     Component Value Date/Time   NA 139 03/22/2015 0958   NA 138 02/08/2015 0705   K 4.1 03/22/2015 0958   K 4.0 02/08/2015 0705   CL 102 02/08/2015 0705   CO2 27 03/22/2015 0958   CO2 27 02/08/2015 0705   GLUCOSE 103 03/22/2015 0958   GLUCOSE 115* 02/08/2015 0705   BUN 16.0 03/22/2015 0958   BUN 22* 02/08/2015 0705   CREATININE 1.1 03/22/2015 0958   CREATININE 1.11 02/08/2015 0705   CALCIUM 9.5 03/22/2015 0958   CALCIUM 8.9 02/08/2015 0705   PROT 7.5 03/22/2015 0958   PROT 5.4* 01/27/2015 0544   ALBUMIN 3.9 03/22/2015 0958   ALBUMIN 2.5* 01/27/2015 0544   AST 15 03/22/2015 0958   AST 24 01/27/2015 0544   ALT 20 03/22/2015 0958   ALT 23 01/27/2015 0544   ALKPHOS 72 03/22/2015 0958   ALKPHOS 55 01/27/2015 0544   BILITOT 0.35 03/22/2015 0958   BILITOT 0.6 01/27/2015 0544   GFRNONAA >60 02/08/2015 0705   GFRAA >60 02/08/2015 0705    INo results found for: SPEP, UPEP  Lab Results  Component Value Date   WBC 6.9 04/12/2015   NEUTROABS 5.5 04/12/2015   HGB 11.3* 04/12/2015   HCT 33.1* 04/12/2015   MCV 85.1 04/12/2015   PLT 422* 04/12/2015      Chemistry        Component Value Date/Time   NA 139 03/22/2015 0958   NA 138 02/08/2015 0705   K 4.1 03/22/2015 0958   K 4.0 02/08/2015 0705   CL 102 02/08/2015 0705   CO2 27 03/22/2015 0958   CO2 27 02/08/2015 0705   BUN 16.0 03/22/2015 0958   BUN 22* 02/08/2015 0705   CREATININE 1.1 03/22/2015 0958   CREATININE 1.11 02/08/2015 0705      Component Value  Date/Time   CALCIUM 9.5 03/22/2015 0958   CALCIUM 8.9 02/08/2015 0705   ALKPHOS 72 03/22/2015 0958   ALKPHOS 55 01/27/2015 0544   AST 15 03/22/2015 0958   AST 24 01/27/2015 0544   ALT 20 03/22/2015 0958   ALT 23 01/27/2015 0544   BILITOT 0.35 03/22/2015 0958   BILITOT 0.6 01/27/2015 0544       No results found for: LABCA2  No components found for: [id number]  No results for input(s): INR in the last 168 hours.  Urinalysis    Component Value Date/Time   COLORURINE YELLOW 01/23/2015 0805   APPEARANCEUR CLEAR 01/23/2015 0805   LABSPEC 1.014 01/23/2015 0805   PHURINE 7.5 01/23/2015 0805   GLUCOSEU NEGATIVE 01/23/2015 0805   HGBUR NEGATIVE 01/23/2015 0805   BILIRUBINUR NEGATIVE 01/23/2015 0805   KETONESUR NEGATIVE 01/23/2015 0805   PROTEINUR NEGATIVE 01/23/2015 0805   NITRITE NEGATIVE 01/23/2015 0805   LEUKOCYTESUR NEGATIVE 01/23/2015 0805    STUDIES: Ct Chest W Contrast  03/21/2015  CLINICAL DATA:  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 Bleeding ulcer with perforation Restaging Dx 01/2015 chemo on going trying to shrink abd/stomach mass before removing Surgery hernia repair EXAM: CT CHEST, ABDOMEN, AND PELVIS WITH CONTRAST TECHNIQUE: Multidetector CT imaging of the chest, abdomen and pelvis was performed following the standard protocol during bolus administration of intravenous contrast. CONTRAST:  156m OMNIPAQUE IOHEXOL 300 MG/ML  SOLN COMPARISON:  01/23/2015 and previous FINDINGS: CT CHEST FINDINGS Vascular: Interval placement of right IJ port catheter, tip proximal right atrium. There is a small amount of  nonocclusive thrombus in the right IJ vein. Patchy coronary calcifications. Mediastinum/Lymph Nodes: Interval decrease in size of the anterior mediastinal mass 33 x 16 mm (previously 44 x 23). Mass anterior to the right ventricle 69 x 17 mm, previously 877 x 40. Confluent sub carinal adenopathy/ mass 47 x 23 mm, previously 75 x 28. There is contiguous soft tissue extending into the AP window with similar decrease in size. The bilateral axillary, pretracheal and right paratracheal adenopathy has resolved. No hilar adenopathy. Lungs/Pleura: No pulmonary mass, infiltrate, or effusion. Musculoskeletal: No chest wall mass or suspicious bone lesions identified. CT ABDOMEN PELVIS FINDINGS Hepatobiliary: Encasement of portal vein, hepatic artery, and CBD by soft tissue mass, with some decrease in overall bulk since previous exam. No intrahepatic lesion. No bile duct dilatation. Gallbladder nondilated. Streak artifact from vascular embolization coils. Pancreas: The above mass abuts the pancreatic head. There is no pancreatic ductal dilatation. Spleen: Within normal limits in size and appearance. Adrenals/Urinary Tract: No masses identified. No evidence of hydronephrosis. Stomach/Bowel: The pylorus and proximal duodenum are contiguous with the above mass. No evidence of obstruction. Distal small bowel and colon are nondilated. Vascular/Lymphatic: Right upper quadrant mass described above encasing the celiac axis and extending across the midline to the lesser curvature of the stomach, with overall decrease in mass effect since prior scan of 01/20/2015. 8 mm left mesenteric node image 64/2, previously 13 mm. Subcentimeter left para-aortic and aortocaval nodes. Significant improvement in the bilateral pelvic adenopathy. Index left external 18 mm node seen previously now 10 mm. Index right external iliac 18 mm noted seen previously now 10 mm. Patchy iliofemoral arterial calcifications without aneurysm. Reproductive: Moderate  prostatic enlargement. Other: No ascites.  No free air.  Left pelvic phlebolith. Musculoskeletal: No suspicious bone lesions identified. Degenerative disc disease L5-S1. IMPRESSION: 1. Significant interval improvement of mediastinal, axillary, pelvic, and mesenteric adenopathy. No new or progressive adenopathy. 2. Significant  decrease in size of bulky right upper abdominal mass abutting the stomach, celiac axis, porta hepatis, and pancreatic head. 3. Right IJ port catheter with some nonocclusive right IJ thrombus noted. Electronically Signed   By: Lucrezia Europe M.D.   On: 03/21/2015 10:00   Ct Abdomen Pelvis W Contrast  03/21/2015  CLINICAL DATA:  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 Bleeding ulcer with perforation Restaging Dx 01/2015 chemo on going trying to shrink abd/stomach mass before removing Surgery hernia repair EXAM: CT CHEST, ABDOMEN, AND PELVIS WITH CONTRAST TECHNIQUE: Multidetector CT imaging of the chest, abdomen and pelvis was performed following the standard protocol during bolus administration of intravenous contrast. CONTRAST:  161m OMNIPAQUE IOHEXOL 300 MG/ML  SOLN COMPARISON:  01/23/2015 and previous FINDINGS: CT CHEST FINDINGS Vascular: Interval placement of right IJ port catheter, tip proximal right atrium. There is a small amount of nonocclusive thrombus in the right IJ vein. Patchy coronary calcifications. Mediastinum/Lymph Nodes: Interval decrease in size of the anterior mediastinal mass 33 x 16 mm (previously 44 x 23). Mass anterior to the right ventricle 69 x 17 mm, previously 877 x 40. Confluent sub carinal adenopathy/ mass 47 x 23 mm, previously 75 x 28. There is contiguous soft tissue extending into the AP window with similar decrease in size. The bilateral axillary, pretracheal and right paratracheal adenopathy has resolved. No hilar adenopathy. Lungs/Pleura: No pulmonary mass, infiltrate, or effusion. Musculoskeletal: No chest wall mass or suspicious bone lesions  identified. CT ABDOMEN PELVIS FINDINGS Hepatobiliary: Encasement of portal vein, hepatic artery, and CBD by soft tissue mass, with some decrease in overall bulk since previous exam. No intrahepatic lesion. No bile duct dilatation. Gallbladder nondilated. Streak artifact from vascular embolization coils. Pancreas: The above mass abuts the pancreatic head. There is no pancreatic ductal dilatation. Spleen: Within normal limits in size and appearance. Adrenals/Urinary Tract: No masses identified. No evidence of hydronephrosis. Stomach/Bowel: The pylorus and proximal duodenum are contiguous with the above mass. No evidence of obstruction. Distal small bowel and colon are nondilated. Vascular/Lymphatic: Right upper quadrant mass described above encasing the celiac axis and extending across the midline to the lesser curvature of the stomach, with overall decrease in mass effect since prior scan of 01/20/2015. 8 mm left mesenteric node image 64/2, previously 13 mm. Subcentimeter left para-aortic and aortocaval nodes. Significant improvement in the bilateral pelvic adenopathy. Index left external 18 mm node seen previously now 10 mm. Index right external iliac 18 mm noted seen previously now 10 mm. Patchy iliofemoral arterial calcifications without aneurysm. Reproductive: Moderate prostatic enlargement. Other: No ascites.  No free air.  Left pelvic phlebolith. Musculoskeletal: No suspicious bone lesions identified. Degenerative disc disease L5-S1. IMPRESSION: 1. Significant interval improvement of mediastinal, axillary, pelvic, and mesenteric adenopathy. No new or progressive adenopathy. 2. Significant decrease in size of bulky right upper abdominal mass abutting the stomach, celiac axis, porta hepatis, and pancreatic head. 3. Right IJ port catheter with some nonocclusive right IJ thrombus noted. Electronically Signed   By: DLucrezia EuropeM.D.   On: 03/21/2015 10:00    ASSESSMENT: 65y.o. [place] man with a diagnosis of  mantle cell non-Hodgkin's lymphoma presenting with syncope secondary to bleeding from a large ulcerated duodenal ulcer  (a) s/p coil embolization of the feeding (gastro-duodenal) artery 01/23/2015  (b) anemia--scant iron stores on bone marrow biopsy--s/p feraheme 02/13/2015  (1) right inguinal lymph node biopsy 01/22/2015 confirms mantle cell lymphoma  (a) bone marrow biopsy 02/06/2015 positive for involvement by the patient's  mantle cell lymphoma  (b) IPI score of 5 (high risk) predicts a 5 year progression free survival of 50% with CHOP-Rituxan chemotherapy  (c) MIPI score of 5 (intermediate risk) predicts a median survival of 58 months  (2) CHOP/Rituxan started 02/09/2015  (3)  [facility] consultation visit for transplant completed. Patient declines at this time.   PLAN: Will and I discussed his decision to forego a transplant. It is partially the idea of being out of work for several months, but also due to the recent death of his sister-in-law earlier this year after complications and an infection with her transplant. I alerted Dr. Hayden Rasmussen to this news. I relayed the message to Will that his current treatment is not curative. He was not swayed by this. For now we will continue on RCHOP. The labs were reviewed in detail and his CBC is normalizing.   Will will return every 3 weeks for RCHOP. In 6 weeks he will review his decision with Dr. Jana Hakim. He understands and agrees with this plan. He has been encouraged to call with any issues that might arise before his next visit here.   Laurie Panda, NP. 04/12/2015

## 2015-04-12 NOTE — Patient Instructions (Signed)
[facility] Discharge Instructions for Patients Receiving Chemotherapy  Today you received the following chemotherapy agents: Rituxan, Vincristine, Cytoan, Adriamycin.  To help prevent nausea and vomiting after your treatment, we encourage you to take your nausea medication: compazine 10 mg every 6 hours as needed; Zofran 8 mg every 12 hours as needed.  If you develop nausea and vomiting that is not controlled by your nausea medication, call the clinic.   BELOW ARE SYMPTOMS THAT SHOULD BE REPORTED IMMEDIATELY:  *FEVER GREATER THAN 100.5 F  *CHILLS WITH OR WITHOUT FEVER  NAUSEA AND VOMITING THAT IS NOT CONTROLLED WITH YOUR NAUSEA MEDICATION  *UNUSUAL SHORTNESS OF BREATH  *UNUSUAL BRUISING OR BLEEDING  TENDERNESS IN MOUTH AND THROAT WITH OR WITHOUT PRESENCE OF ULCERS  *URINARY PROBLEMS  *BOWEL PROBLEMS  UNUSUAL RASH Items with * indicate a potential emergency and should be followed up as soon as possible.  Feel free to call the clinic you have any questions or concerns. The clinic phone number is [phone].  Please show the [place] at check-in to the Emergency Department and triage nurse.

## 2015-04-13 ENCOUNTER — Telehealth: Payer: Self-pay | Admitting: *Deleted

## 2015-04-13 NOTE — Telephone Encounter (Signed)
Per staff message and POF I have scheduled appts. Advised scheduler of appts. JMW  

## 2015-04-24 ENCOUNTER — Telehealth: Payer: Self-pay | Admitting: Oncology

## 2015-04-24 NOTE — Telephone Encounter (Signed)
Spoke with patient wife to confirm 3/30 appointments at 8 am. GM 3/30 reschedule list.

## 2015-05-03 ENCOUNTER — Other Ambulatory Visit: Payer: Self-pay | Admitting: Hematology and Oncology

## 2015-05-03 ENCOUNTER — Ambulatory Visit (HOSPITAL_BASED_OUTPATIENT_CLINIC_OR_DEPARTMENT_OTHER): Payer: BLUE CROSS/BLUE SHIELD

## 2015-05-03 ENCOUNTER — Other Ambulatory Visit (HOSPITAL_BASED_OUTPATIENT_CLINIC_OR_DEPARTMENT_OTHER): Payer: BLUE CROSS/BLUE SHIELD

## 2015-05-03 ENCOUNTER — Encounter: Payer: Self-pay | Admitting: Nurse Practitioner

## 2015-05-03 ENCOUNTER — Ambulatory Visit (HOSPITAL_BASED_OUTPATIENT_CLINIC_OR_DEPARTMENT_OTHER): Payer: BLUE CROSS/BLUE SHIELD | Admitting: Nurse Practitioner

## 2015-05-03 VITALS — BP 120/71 | HR 67 | Temp 97.0°F | Resp 18

## 2015-05-03 VITALS — BP 130/69 | HR 69 | Temp 97.9°F | Resp 18 | Ht 70.0 in | Wt 203.3 lb

## 2015-05-03 DIAGNOSIS — C8315 Mantle cell lymphoma, lymph nodes of inguinal region and lower limb: Secondary | ICD-10-CM | POA: Diagnosis not present

## 2015-05-03 DIAGNOSIS — Z5111 Encounter for antineoplastic chemotherapy: Secondary | ICD-10-CM

## 2015-05-03 DIAGNOSIS — C8319 Mantle cell lymphoma, extranodal and solid organ sites: Secondary | ICD-10-CM

## 2015-05-03 DIAGNOSIS — D649 Anemia, unspecified: Secondary | ICD-10-CM

## 2015-05-03 DIAGNOSIS — K922 Gastrointestinal hemorrhage, unspecified: Secondary | ICD-10-CM

## 2015-05-03 DIAGNOSIS — Z5112 Encounter for antineoplastic immunotherapy: Secondary | ICD-10-CM

## 2015-05-03 DIAGNOSIS — D630 Anemia in neoplastic disease: Secondary | ICD-10-CM

## 2015-05-03 LAB — CBC WITH DIFFERENTIAL/PLATELET
BASO%: 0.9 % (ref 0.0–2.0)
Basophils Absolute: 0 10*3/uL (ref 0.0–0.1)
EOS%: 0.4 % (ref 0.0–7.0)
Eosinophils Absolute: 0 10*3/uL (ref 0.0–0.5)
HEMATOCRIT: 34.7 % — AB (ref 38.4–49.9)
HEMOGLOBIN: 11.4 g/dL — AB (ref 13.0–17.1)
LYMPH#: 0.5 10*3/uL — AB (ref 0.9–3.3)
LYMPH%: 11.2 % — ABNORMAL LOW (ref 14.0–49.0)
MCH: 28 pg (ref 27.2–33.4)
MCHC: 32.8 g/dL (ref 32.0–36.0)
MCV: 85.5 fL (ref 79.3–98.0)
MONO#: 0.6 10*3/uL (ref 0.1–0.9)
MONO%: 13.4 % (ref 0.0–14.0)
NEUT%: 74.1 % (ref 39.0–75.0)
NEUTROS ABS: 3.1 10*3/uL (ref 1.5–6.5)
PLATELETS: 369 10*3/uL (ref 140–400)
RBC: 4.07 10*6/uL — ABNORMAL LOW (ref 4.20–5.82)
RDW: 19.4 % — AB (ref 11.0–14.6)
WBC: 4.2 10*3/uL (ref 4.0–10.3)

## 2015-05-03 LAB — COMPREHENSIVE METABOLIC PANEL
ALK PHOS: 58 U/L (ref 40–150)
ALT: 33 U/L (ref 0–55)
ANION GAP: 9 meq/L (ref 3–11)
AST: 24 U/L (ref 5–34)
Albumin: 3.8 g/dL (ref 3.5–5.0)
BILIRUBIN TOTAL: 0.38 mg/dL (ref 0.20–1.20)
BUN: 18.9 mg/dL (ref 7.0–26.0)
CALCIUM: 9.1 mg/dL (ref 8.4–10.4)
CHLORIDE: 106 meq/L (ref 98–109)
CO2: 25 meq/L (ref 22–29)
Creatinine: 1.2 mg/dL (ref 0.7–1.3)
EGFR: 78 mL/min/{1.73_m2} — ABNORMAL LOW (ref >90)
Glucose: 100 mg/dl (ref 70–140)
Potassium: 4.2 mEq/L (ref 3.5–5.1)
Sodium: 140 mEq/L (ref 136–145)
TOTAL PROTEIN: 7 g/dL (ref 6.4–8.3)

## 2015-05-03 LAB — LACTATE DEHYDROGENASE: LDH: 332 U/L — AB (ref 125–245)

## 2015-05-03 MED ORDER — DIPHENHYDRAMINE HCL 25 MG PO CAPS
ORAL_CAPSULE | ORAL | Status: AC
  Filled 2015-05-03: qty 1

## 2015-05-03 MED ORDER — SODIUM CHLORIDE 0.9 % IJ SOLN
10.0000 mL | INTRAMUSCULAR | Status: DC | PRN
  Administered 2015-05-03: 10 mL
  Filled 2015-05-03: qty 10

## 2015-05-03 MED ORDER — ACETAMINOPHEN 325 MG PO TABS
650.0000 mg | ORAL_TABLET | Freq: Once | ORAL | Status: AC
  Administered 2015-05-03: 650 mg via ORAL

## 2015-05-03 MED ORDER — HEPARIN SOD (PORK) LOCK FLUSH 100 UNIT/ML IV SOLN
500.0000 [IU] | Freq: Once | INTRAVENOUS | Status: AC | PRN
  Administered 2015-05-03: 500 [IU]
  Filled 2015-05-03: qty 5

## 2015-05-03 MED ORDER — SODIUM CHLORIDE 0.9 % IV SOLN
Freq: Once | INTRAVENOUS | Status: AC
  Administered 2015-05-03: 09:00:00 via INTRAVENOUS

## 2015-05-03 MED ORDER — ACETAMINOPHEN 325 MG PO TABS
ORAL_TABLET | ORAL | Status: AC
  Filled 2015-05-03: qty 2

## 2015-05-03 MED ORDER — SODIUM CHLORIDE 0.9 % IV SOLN
375.0000 mg/m2 | Freq: Once | INTRAVENOUS | Status: AC
  Administered 2015-05-03: 800 mg via INTRAVENOUS
  Filled 2015-05-03: qty 70

## 2015-05-03 MED ORDER — PEGFILGRASTIM 6 MG/0.6ML ~~LOC~~ PSKT
6.0000 mg | PREFILLED_SYRINGE | Freq: Once | SUBCUTANEOUS | Status: AC
  Administered 2015-05-03: 6 mg via SUBCUTANEOUS
  Filled 2015-05-03: qty 0.6

## 2015-05-03 MED ORDER — SODIUM CHLORIDE 0.9 % IV SOLN
Freq: Once | INTRAVENOUS | Status: AC
  Administered 2015-05-03: 10:00:00 via INTRAVENOUS
  Filled 2015-05-03: qty 8

## 2015-05-03 MED ORDER — SODIUM CHLORIDE 0.9 % IV SOLN
750.0000 mg/m2 | Freq: Once | INTRAVENOUS | Status: AC
  Administered 2015-05-03: 1640 mg via INTRAVENOUS
  Filled 2015-05-03: qty 82

## 2015-05-03 MED ORDER — VINCRISTINE SULFATE CHEMO INJECTION 1 MG/ML
2.0000 mg | Freq: Once | INTRAVENOUS | Status: AC
  Administered 2015-05-03: 2 mg via INTRAVENOUS
  Filled 2015-05-03: qty 2

## 2015-05-03 MED ORDER — DOXORUBICIN HCL CHEMO IV INJECTION 2 MG/ML
50.0000 mg/m2 | Freq: Once | INTRAVENOUS | Status: AC
  Administered 2015-05-03: 110 mg via INTRAVENOUS
  Filled 2015-05-03: qty 55

## 2015-05-03 MED ORDER — DIPHENHYDRAMINE HCL 25 MG PO CAPS
25.0000 mg | ORAL_CAPSULE | Freq: Once | ORAL | Status: AC
  Administered 2015-05-03: 25 mg via ORAL

## 2015-05-03 NOTE — Progress Notes (Signed)
Pt told me he fell at home, back up to step and fell on his buttocks. Pt said he had no bruising but was sore.

## 2015-05-03 NOTE — Progress Notes (Signed)
Positive blood return noted before, during and at end of Adriamycin.

## 2015-05-03 NOTE — Patient Instructions (Signed)
[facility] Discharge Instructions for Patients Receiving Chemotherapy  Today you received the following chemotherapy agents Adriamycin/vincristine/Cytoxan/Rituxan.  To help prevent nausea and vomiting after your treatment, we encourage you to take your nausea medication as prescribed.   If you develop nausea and vomiting that is not controlled by your nausea medication, call the clinic.   BELOW ARE SYMPTOMS THAT SHOULD BE REPORTED IMMEDIATELY:  *FEVER GREATER THAN 100.5 F  *CHILLS WITH OR WITHOUT FEVER  NAUSEA AND VOMITING THAT IS NOT CONTROLLED WITH YOUR NAUSEA MEDICATION  *UNUSUAL SHORTNESS OF BREATH  *UNUSUAL BRUISING OR BLEEDING  TENDERNESS IN MOUTH AND THROAT WITH OR WITHOUT PRESENCE OF ULCERS  *URINARY PROBLEMS  *BOWEL PROBLEMS  UNUSUAL RASH Items with * indicate a potential emergency and should be followed up as soon as possible.  Feel free to call the clinic you have any questions or concerns. The clinic phone number is [phone].  Please show the [facility] at check-in to the Emergency Department and triage nurse.

## 2015-05-03 NOTE — Progress Notes (Signed)
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 He "blew this off" and it did not happen again until 01/17/2015 when he felt weak at work and later fainted at home. He was brought to the ED where he was found to be guaiac positive (denies melena before that date) with a Hb of 4.5 MCV 82.2, platelets 295K, WBC 9.1 and normal INR and PTT. He was admitted and underwent EGD under Dr Michail Sermon 01/18/2015 showing a very large duodenal ulcer w clot. Abdominal US 01/19/2015 was not very informative but CT abd/pelvis 12/17,2016 showed adenopathy in the anterior mediastinum, retroperitoneum and pelvis, with a large ulcerated mass involving the distal stomach/proximal duodenum and extending into the gastrohepatic region.   On 01/22/2015 he underwent right inguinal lymph node biopsy, consistent with mantle cell lymphoma (([id number] and [id number])). Specifically the lymph node showed effacement of the architecture by sheets of small to medium sized lymphocytes with no apparent nodularity. The cells were positive for CD20, CD5, BCL-2, CD21, and cyclin D1. He 67 was low (10-20%). CD21 revealed a few residual germinal centers area at the B cells were negative for CD10, and BCL-6. CD23 was negative.  On 02/06/2015 the patient underwent  a bone marrow aspirate and biopsy. The pathology from this procedure (FZB 17-2 and 17-7) was positive for involvement by non-Hodgkin's lymphoma with numerous atypical interstitial and paratrabecular lymphoid aggregates consistent with mantle cell lymphoma. Iron stain showed scant iron flow cytometry confirmed a monoclonal B-cell population, lambda restricted, consistent with the patient's known diagnosis of mantle cell lymphoma.  On 01/18/2015 the patient underwent EGD which found a massive duodenal bulb ulcer with adherent clot, although no active bleeding.his hemoglobin was in the 8.4 range and stable. He was discharged on 01/22/2015, but within 24 hours was readmitted with syncope and altered mental status. He had aspirated and required intubation for his respiratory failure. Hemoglobin was down to 6.3. An NG tube was placed, the patient was multiply transfused, and Surgery was considered but felt not to be feasible.accordingly the patient underwent arteriography 01/23/2015 which found the superior mesenteric and celiac arteries not to be the involved vessels. The gastroduodenal artery showed multiple branches providing supply to the hypervascular duodenal mass, and this artery was coil embolized.. Also a port was placed at the time of this procedure with a view to eventual chemotherapy.  He was discharged 01/26/2015,but required readmission 02/07/2015 for another presyncopal episode. He presented to the emergency room where he was found to have a hemoglobin of 7.5. The patient was transfusedIIA hemoglobin of 10 and was discharged 02/07/2014. He was scheduled to start CHOP right toxin 02/08/2014.  His subsequent history is as detailed below.  INTERVAL HISTORY: [name] returns today for follow-up of his mantle cell lymphoma. Today is day 1, cycle 5 of 6 planned cycles of CHOP/rituximab, given every 3 weeks.  REVIEW OF SYSTEMS: [name] has even  more energy than when we last saw him. He continue to work a  normal schedule when it is not treatment week. He even got outside and cut 2 different lawns last weekend. His appetite is healthy. He denies fevers, chills, nausea, or vomiting. He is moving his bowels well. He denies mouth sores, rashes, or neuropathy symptoms. His mood is excellent. A detailed review of systems is otherwise stable.  PAST MEDICAL HISTORY: Past Medical History  Diagnosis Date  . Essential hypertension   . HLD (hyperlipidemia)   . GERD (gastroesophageal reflux disease)   . Anemia   . Cancer ([place])     Lymphoma  . GI bleed   . Weakness     PAST SURGICAL HISTORY: Past Surgical History  Procedure Laterality Date  . Skin surgery      Small benign cysts over left scalp removed  . Esophagogastroduodenoscopy Left 01/18/2015    Procedure: ESOPHAGOGASTRODUODENOSCOPY (EGD);  Surgeon: Wilford Corner, MD;  Location: [facility];  Service: Endoscopy;  Laterality: Left;  . Inguinal hernia repair Right 01/22/2015    Procedure: RIGHT INGUINAL LYMPH NODE BX;  Surgeon: Donnie Mesa, MD;  Location: [place];  Service: General;  Laterality: Right;  . Portacath placement  01/26/2015     power port with tip SVC/RA Junction    FAMILY HISTORY Family History  Problem Relation Age of Onset  . Hypertension Mother   . Hypertension Father   . Hypertension Sister   . Diabetes Sister   . Prostate cancer Brother   . Lupus Sister   . Kidney failure Father   The patient's father died from CHF complications age 63. The patient's mother is 40 y/o as of December 2016. The patient has 9 brothers, 8 sisters. One brother has prostate cancer. There is no other cancer history in the family to his knowledge.  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 His wife of 32 years, [name], is disabled due to RA. Daughtr [name] works as a Teacher, early years/pre for [employer] (she previously worked at the [employer] in records). Son [name] owns a window washing business. Son  [name] is in Press photographer at [employer]. All live in [place]. The patient has 3 grandchildren. He attends a local [employer] DIRECTIVES: Not in place   HEALTH MAINTENANCE: Social History  Substance Use Topics  . Smoking status: Never Smoker   . Smokeless tobacco: Never Used  . Alcohol Use: Yes     Comment: socially     Colonoscopy:  PSA: 2.27 on 01/22/2015  Hepatitis serologies (be surface antigen, hepatitis B and hepatitis see IgM) negative 01/18/2015  No Known Allergies  Current Outpatient Prescriptions  Medication Sig Dispense Refill  . allopurinol (ZYLOPRIM) 300 MG tablet Take 1 tablet (300 mg total) by mouth daily. 30 tablet 3  . lisinopril-hydrochlorothiazide (PRINZIDE,ZESTORETIC) 20-25 MG tablet Take 1 tablet by mouth daily.    Marland Kitchen lovastatin (MEVACOR) 20 MG tablet Take 20 mg by mouth at bedtime.    Marland Kitchen omeprazole (PRILOSEC) 40 MG capsule Take 1 capsule (40 mg total) by mouth 2 (two) times daily. 60 capsule 0  . predniSONE (DELTASONE) 20 MG tablet Take 3 tablets a day ( in am ) with food x 5 days starting on day 1 of treatment plan 15 tablet 6  . LORazepam (ATIVAN) 0.5 MG tablet Take 1 tablet (0.5 mg total) by mouth every 6 (six) hours as needed (Nausea or vomiting). (  Patient not taking: Reported on 05/03/2015) 30 tablet 0  . ondansetron (ZOFRAN) 8 MG tablet Take 1 tablet (8 mg total) by mouth 2 (two) times daily. Start the day after chemo for 3 days. Then as needed for nausea or vomiting. (Patient not taking: Reported on 03/22/2015) 30 tablet 1  . prochlorperazine (COMPAZINE) 10 MG tablet Take 1 tablet (10 mg total) by mouth every 6 (six) hours as needed (Nausea or vomiting). (Patient not taking: Reported on 03/08/2015) 30 tablet 6  . sucralfate (CARAFATE) 1 g tablet Take 1 tablet (1 g total) by mouth 4 (four) times daily -  with meals and at bedtime. (Patient not taking: Reported on 05/03/2015) 90 tablet 0   No current facility-administered medications for this  visit.    OBJECTIVE: Middle-aged African-American man in no acute distress Filed Vitals:   05/03/15 0852  BP: 130/69  Pulse: 69  Temp: 97.9 F (36.6 C)  Resp: 18     Body mass index is 29.17 kg/(m^2).    ECOG FS:0 - Asymptomatic  Skin: warm, dry  HEENT: sclerae anicteric, conjunctivae pink, oropharynx clear. No thrush or mucositis.  Lymph Nodes: No cervical or supraclavicular lymphadenopathy  Lungs: clear to auscultation bilaterally, no rales, wheezes, or rhonci  Heart: regular rate and rhythm  Abdomen: round, soft, non tender, positive bowel sounds  Musculoskeletal: No focal spinal tenderness, no peripheral edema  Neuro: non focal, well oriented, positive affect    LAB RESULTS:  CMP     Component Value Date/Time   NA 140 05/03/2015 0752   NA 138 02/08/2015 0705   K 4.2 05/03/2015 0752   K 4.0 02/08/2015 0705   CL 102 02/08/2015 0705   CO2 25 05/03/2015 0752   CO2 27 02/08/2015 0705   GLUCOSE 100 05/03/2015 0752   GLUCOSE 115* 02/08/2015 0705   BUN 18.9 05/03/2015 0752   BUN 22* 02/08/2015 0705   CREATININE 1.2 05/03/2015 0752   CREATININE 1.11 02/08/2015 0705   CALCIUM 9.1 05/03/2015 0752   CALCIUM 8.9 02/08/2015 0705   PROT 7.0 05/03/2015 0752   PROT 5.4* 01/27/2015 0544   ALBUMIN 3.8 05/03/2015 0752   ALBUMIN 2.5* 01/27/2015 0544   AST 24 05/03/2015 0752   AST 24 01/27/2015 0544   ALT 33 05/03/2015 0752   ALT 23 01/27/2015 0544   ALKPHOS 58 05/03/2015 0752   ALKPHOS 55 01/27/2015 0544   BILITOT 0.38 05/03/2015 0752   BILITOT 0.6 01/27/2015 0544   GFRNONAA >60 02/08/2015 0705   GFRAA >60 02/08/2015 0705    INo results found for: SPEP, UPEP  Lab Results  Component Value Date   WBC 4.2 05/03/2015   NEUTROABS 3.1 05/03/2015   HGB 11.4* 05/03/2015   HCT 34.7* 05/03/2015   MCV 85.5 05/03/2015   PLT 369 05/03/2015      Chemistry      Component Value Date/Time   NA 140 05/03/2015 0752   NA 138 02/08/2015 0705   K 4.2 05/03/2015 0752   K 4.0  02/08/2015 0705   CL 102 02/08/2015 0705   CO2 25 05/03/2015 0752   CO2 27 02/08/2015 0705   BUN 18.9 05/03/2015 0752   BUN 22* 02/08/2015 0705   CREATININE 1.2 05/03/2015 0752   CREATININE 1.11 02/08/2015 0705      Component Value Date/Time   CALCIUM 9.1 05/03/2015 0752   CALCIUM 8.9 02/08/2015 0705   ALKPHOS 58 05/03/2015 0752   ALKPHOS 55 01/27/2015 0544   AST 24 05/03/2015 0752  AST 24 01/27/2015 0544   ALT 33 05/03/2015 0752   ALT 23 01/27/2015 0544   BILITOT 0.38 05/03/2015 0752   BILITOT 0.6 01/27/2015 0544       No results found for: LABCA2  No components found for: [id number]  No results for input(s): INR in the last 168 hours.  Urinalysis    Component Value Date/Time   COLORURINE YELLOW 01/23/2015 0805   APPEARANCEUR CLEAR 01/23/2015 0805   LABSPEC 1.014 01/23/2015 0805   PHURINE 7.5 01/23/2015 0805   GLUCOSEU NEGATIVE 01/23/2015 0805   HGBUR NEGATIVE 01/23/2015 0805   BILIRUBINUR NEGATIVE 01/23/2015 0805   KETONESUR NEGATIVE 01/23/2015 0805   PROTEINUR NEGATIVE 01/23/2015 0805   NITRITE NEGATIVE 01/23/2015 0805   LEUKOCYTESUR NEGATIVE 01/23/2015 0805    STUDIES: No results found.  ASSESSMENT: 65 y.o. [place] man with a diagnosis of mantle cell non-Hodgkin's lymphoma presenting with syncope secondary to bleeding from a large ulcerated duodenal ulcer  (a) s/p coil embolization of the feeding (gastro-duodenal) artery 01/23/2015  (b) anemia--scant iron stores on bone marrow biopsy--s/p feraheme 02/13/2015  (1) right inguinal lymph node biopsy 01/22/2015 confirms mantle cell lymphoma  (a) bone marrow biopsy 02/06/2015 positive for involvement by the patient's mantle cell lymphoma  (b) IPI score of 5 (high risk) predicts a 5 year progression free survival of 50% with CHOP-Rituxan chemotherapy  (c) MIPI score of 5 (intermediate risk) predicts a median survival of 58 months  (2) CHOP/Rituxan started 02/09/2015  (3)  [facility] consultation visit for  transplant completed. Patient declines at this time.   PLAN: [name] feels great today. The labs were reviewed in detail and were stable. His mild anemia is virtually unchanged with an hgb of 11.4.  He [name] proceed with cycle 5 of RCHOP as planned.   Again, he is committed to the decision to forego a transplant. He is happy with the results that he is seeing with chemotherapy. He understands that this current treatment is not curative, but from his perspective "it is doing the job" and keeping him active.  [name] [name] return in 3 weeks for cycle 6 of treatment and follow up with Dr. Jana Hakim. During this time the [name] discuss future treatments and scans. He understands and agrees with this plan. He has been encouraged to call with any issues that might arise before his next visit here.   Laurie Panda, NP. 05/03/2015

## 2015-05-24 ENCOUNTER — Ambulatory Visit (HOSPITAL_BASED_OUTPATIENT_CLINIC_OR_DEPARTMENT_OTHER): Payer: BLUE CROSS/BLUE SHIELD

## 2015-05-24 ENCOUNTER — Ambulatory Visit (HOSPITAL_BASED_OUTPATIENT_CLINIC_OR_DEPARTMENT_OTHER): Payer: BLUE CROSS/BLUE SHIELD | Admitting: Oncology

## 2015-05-24 ENCOUNTER — Other Ambulatory Visit (HOSPITAL_BASED_OUTPATIENT_CLINIC_OR_DEPARTMENT_OTHER): Payer: BLUE CROSS/BLUE SHIELD

## 2015-05-24 ENCOUNTER — Telehealth: Payer: Self-pay | Admitting: Oncology

## 2015-05-24 VITALS — BP 128/80 | HR 75 | Temp 97.7°F | Resp 18 | Ht 70.0 in | Wt 202.8 lb

## 2015-05-24 VITALS — BP 116/73 | HR 73 | Temp 97.7°F | Resp 16

## 2015-05-24 DIAGNOSIS — C8315 Mantle cell lymphoma, lymph nodes of inguinal region and lower limb: Secondary | ICD-10-CM

## 2015-05-24 DIAGNOSIS — Z5112 Encounter for antineoplastic immunotherapy: Secondary | ICD-10-CM

## 2015-05-24 DIAGNOSIS — C8319 Mantle cell lymphoma, extranodal and solid organ sites: Secondary | ICD-10-CM

## 2015-05-24 DIAGNOSIS — K922 Gastrointestinal hemorrhage, unspecified: Secondary | ICD-10-CM

## 2015-05-24 DIAGNOSIS — Z5111 Encounter for antineoplastic chemotherapy: Secondary | ICD-10-CM

## 2015-05-24 LAB — COMPREHENSIVE METABOLIC PANEL
ALBUMIN: 3.7 g/dL (ref 3.5–5.0)
ALK PHOS: 58 U/L (ref 40–150)
ALT: 26 U/L (ref 0–55)
AST: 22 U/L (ref 5–34)
Anion Gap: 12 mEq/L — ABNORMAL HIGH (ref 3–11)
BUN: 19.6 mg/dL (ref 7.0–26.0)
CALCIUM: 9.2 mg/dL (ref 8.4–10.4)
CO2: 23 mEq/L (ref 22–29)
CREATININE: 1.1 mg/dL (ref 0.7–1.3)
Chloride: 105 mEq/L (ref 98–109)
EGFR: 85 mL/min/{1.73_m2} — ABNORMAL LOW (ref >90)
Glucose: 105 mg/dl (ref 70–140)
POTASSIUM: 3.6 meq/L (ref 3.5–5.1)
Sodium: 140 mEq/L (ref 136–145)
Total Bilirubin: <0.3 mg/dL (ref 0.20–1.20)
Total Protein: 6.8 g/dL (ref 6.4–8.3)

## 2015-05-24 LAB — CBC WITH DIFFERENTIAL/PLATELET
BASO%: 1.4 % (ref 0.0–2.0)
BASOS ABS: 0.1 10*3/uL (ref 0.0–0.1)
EOS%: 0.6 % (ref 0.0–7.0)
Eosinophils Absolute: 0 10*3/uL (ref 0.0–0.5)
HEMATOCRIT: 35.1 % — AB (ref 38.4–49.9)
HEMOGLOBIN: 11.5 g/dL — AB (ref 13.0–17.1)
LYMPH#: 0.9 10*3/uL (ref 0.9–3.3)
LYMPH%: 22.5 % (ref 14.0–49.0)
MCH: 27.9 pg (ref 27.2–33.4)
MCHC: 32.8 g/dL (ref 32.0–36.0)
MCV: 85.2 fL (ref 79.3–98.0)
MONO#: 0.7 10*3/uL (ref 0.1–0.9)
MONO%: 19.3 % — ABNORMAL HIGH (ref 0.0–14.0)
NEUT#: 2.1 10*3/uL (ref 1.5–6.5)
NEUT%: 56.2 % (ref 39.0–75.0)
Platelets: 365 10*3/uL (ref 140–400)
RBC: 4.12 10*6/uL — ABNORMAL LOW (ref 4.20–5.82)
RDW: 18.4 % — AB (ref 11.0–14.6)
WBC: 3.8 10*3/uL — ABNORMAL LOW (ref 4.0–10.3)

## 2015-05-24 LAB — LACTATE DEHYDROGENASE: LDH: 271 U/L — ABNORMAL HIGH (ref 125–245)

## 2015-05-24 MED ORDER — DIPHENHYDRAMINE HCL 25 MG PO CAPS
ORAL_CAPSULE | ORAL | Status: AC
  Filled 2015-05-24: qty 1

## 2015-05-24 MED ORDER — SODIUM CHLORIDE 0.9 % IV SOLN
Freq: Once | INTRAVENOUS | Status: AC
  Administered 2015-05-24: 10:00:00 via INTRAVENOUS
  Filled 2015-05-24: qty 8

## 2015-05-24 MED ORDER — DIPHENHYDRAMINE HCL 25 MG PO CAPS
25.0000 mg | ORAL_CAPSULE | Freq: Once | ORAL | Status: AC
  Administered 2015-05-24: 25 mg via ORAL

## 2015-05-24 MED ORDER — SODIUM CHLORIDE 0.9 % IJ SOLN
10.0000 mL | INTRAMUSCULAR | Status: DC | PRN
  Administered 2015-05-24: 10 mL
  Filled 2015-05-24: qty 10

## 2015-05-24 MED ORDER — PEGFILGRASTIM 6 MG/0.6ML ~~LOC~~ PSKT
6.0000 mg | PREFILLED_SYRINGE | Freq: Once | SUBCUTANEOUS | Status: AC
  Administered 2015-05-24: 6 mg via SUBCUTANEOUS
  Filled 2015-05-24: qty 0.6

## 2015-05-24 MED ORDER — HEPARIN SOD (PORK) LOCK FLUSH 100 UNIT/ML IV SOLN
500.0000 [IU] | Freq: Once | INTRAVENOUS | Status: AC | PRN
  Administered 2015-05-24: 500 [IU]
  Filled 2015-05-24: qty 5

## 2015-05-24 MED ORDER — ACETAMINOPHEN 325 MG PO TABS
650.0000 mg | ORAL_TABLET | Freq: Once | ORAL | Status: AC
  Administered 2015-05-24: 650 mg via ORAL

## 2015-05-24 MED ORDER — SODIUM CHLORIDE 0.9 % IV SOLN
Freq: Once | INTRAVENOUS | Status: AC
  Administered 2015-05-24: 10:00:00 via INTRAVENOUS

## 2015-05-24 MED ORDER — SODIUM CHLORIDE 0.9 % IV SOLN
750.0000 mg/m2 | Freq: Once | INTRAVENOUS | Status: AC
  Administered 2015-05-24: 1640 mg via INTRAVENOUS
  Filled 2015-05-24: qty 50

## 2015-05-24 MED ORDER — DOXORUBICIN HCL CHEMO IV INJECTION 2 MG/ML
50.0000 mg/m2 | Freq: Once | INTRAVENOUS | Status: AC
  Administered 2015-05-24: 110 mg via INTRAVENOUS
  Filled 2015-05-24: qty 55

## 2015-05-24 MED ORDER — VINCRISTINE SULFATE CHEMO INJECTION 1 MG/ML
2.0000 mg | Freq: Once | INTRAVENOUS | Status: AC
  Administered 2015-05-24: 2 mg via INTRAVENOUS
  Filled 2015-05-24: qty 2

## 2015-05-24 MED ORDER — SODIUM CHLORIDE 0.9 % IV SOLN
375.0000 mg/m2 | Freq: Once | INTRAVENOUS | Status: AC
  Administered 2015-05-24: 800 mg via INTRAVENOUS
  Filled 2015-05-24: qty 70

## 2015-05-24 MED ORDER — ACETAMINOPHEN 325 MG PO TABS
ORAL_TABLET | ORAL | Status: AC
  Filled 2015-05-24: qty 2

## 2015-05-24 NOTE — Telephone Encounter (Signed)
apptmade and avs will print in treatment room. Staff message sent to override GM 6/1 sch at 815 am

## 2015-05-24 NOTE — Progress Notes (Signed)
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 He "blew this off" and it did not happen again until 01/17/2015 when he felt weak at work and later fainted at home. He was brought to the ED where he was found to be guaiac positive (denies melena before that date) with a Hb of 4.5 MCV 82.2, platelets 295K, WBC 9.1 and normal INR and PTT. He was admitted and underwent EGD under Dr Schooler 01/18/2015 showing a very large duodenal ulcer w clot. Abdominal US 01/19/2015 was not very informative but CT abd/pelvis 12/17,2016 showed adenopathy in the anterior mediastinum, retroperitoneum and pelvis, with a large ulcerated mass involving the distal stomach/proximal duodenum and extending into the gastrohepatic region.   On 01/22/2015 he underwent right inguinal lymph node biopsy, consistent with mantle cell lymphoma (([id number] and [id number])). Specifically the lymph node showed effacement of the architecture by sheets of small to medium sized lymphocytes with no apparent nodularity. The cells were positive for CD20, CD5, BCL-2, CD21, and cyclin D1. He 67 was low (10-20%). CD21 revealed a few residual germinal centers area at the B cells were negative for CD10, and BCL-6. CD23 was negative.  On 02/06/2015 the patient underwent  a bone marrow aspirate and biopsy. The pathology from this procedure (FZB 17-2 and 17-7) was positive for involvement by non-Hodgkin's lymphoma with numerous atypical interstitial and paratrabecular lymphoid aggregates consistent with mantle cell lymphoma. Iron stain showed scant iron flow cytometry confirmed a monoclonal B-cell population, lambda restricted, consistent with the patient's known diagnosis of mantle cell lymphoma.  On 01/18/2015 the patient underwent EGD which found a massive duodenal bulb ulcer with adherent clot, although no active bleeding.his hemoglobin was in the 8.4 range and stable. He was discharged on 01/22/2015, but within 24 hours was readmitted with syncope and altered mental status. He had aspirated and required intubation for his respiratory failure. Hemoglobin was down to 6.3. An NG tube was placed, the patient was multiply transfused, and Surgery was considered but felt not to be feasible.accordingly the patient underwent arteriography 01/23/2015 which found the superior mesenteric and celiac arteries not to be the involved vessels. The gastroduodenal artery showed multiple branches providing supply to the hypervascular duodenal mass, and this artery was coil embolized.. Also a port was placed at the time of this procedure with a view to eventual chemotherapy.  He was discharged 01/26/2015,but required readmission 02/07/2015 for another presyncopal episode. He presented to the emergency room where he was found to have a hemoglobin of 7.5. The patient was transfusedIIA hemoglobin of 10 and was discharged 02/07/2014. He was scheduled to start CHOP right toxin 02/08/2014.  His subsequent history is as detailed below.  INTERVAL HISTORY: [name] returns today for treatment of his mantle cell lymphoma. Today is day 1, cycle 6 of 8 planned cycles of CHOP/rituximab, given every 3 weeks.  REVIEW OF SYSTEMS: [name] is doing "  just great". His been back to work for 6 weeks. His job of  course involves quite a bit of lifting and is very physical. He is enjoying it. He would like to start running again. He is not taking any nausea medicine and has had no nausea problems with any of the treatment he gets loose bowel movement or 2 after each cycle, but that resolves without intervention. He has had no problems with rash or fever fatigue or weight loss. In fact his appetite is excellent and he keeps an excellent sense of taste. He is excited because his youngest son is currently married this weekend. A detailed review of systems today was otherwise noncontributory  PAST MEDICAL HISTORY: Past Medical History  Diagnosis Date  . Essential hypertension   . HLD (hyperlipidemia)   . GERD (gastroesophageal reflux disease)   . Anemia   . Cancer ([place])     Lymphoma  . GI bleed   . Weakness     PAST SURGICAL HISTORY: Past Surgical History  Procedure Laterality Date  . Skin surgery      Small benign cysts over left scalp removed  . Esophagogastroduodenoscopy Left 01/18/2015    Procedure: ESOPHAGOGASTRODUODENOSCOPY (EGD);  Surgeon: Wilford Corner, MD;  Location: [facility] ENDOSCOPY;  Service: Endoscopy;  Laterality: Left;  . Inguinal hernia repair Right 01/22/2015    Procedure: RIGHT INGUINAL LYMPH NODE BX;  Surgeon: Donnie Mesa, MD;  Location: [facility];  Service: General;  Laterality: Right;  . Portacath placement  01/26/2015     power port with tip SVC/RA Junction    FAMILY HISTORY Family History  Problem Relation Age of Onset  . Hypertension Mother   . Hypertension Father   . Hypertension Sister   . Diabetes Sister   . Prostate cancer Brother   . Lupus Sister   . Kidney failure Father   The patient's father died from CHF complications age 10. The patient's mother is 34 y/o as of December 2016. The patient has 9 brothers, 8 sisters. One brother has prostate cancer. There is no other cancer history in the family to his knowledge.  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 His wife of 51 years, [name], is disabled due to RA. Daughtr [name] works as a Teacher, early years/pre for [employer] (she previously worked at the [employer] in records). Son [name] owns a window washing business. Son [name] is in Press photographer at [employer]. All live in [place]. The patient has 3 grandchildren. He attends a local [place] DIRECTIVES: Not in place   HEALTH MAINTENANCE: Social History  Substance Use Topics  . Smoking status: Never Smoker   . Smokeless tobacco: Never Used  . Alcohol Use: Yes     Comment: socially     Colonoscopy:  PSA: 2.27 on 01/22/2015  Hepatitis serologies (be surface antigen, hepatitis B and hepatitis see IgM) negative 01/18/2015  No Known Allergies  Current Outpatient Prescriptions  Medication Sig Dispense Refill  . allopurinol (ZYLOPRIM) 300 MG tablet Take 1 tablet (300 mg total) by mouth daily. 30 tablet 3  . lisinopril-hydrochlorothiazide (PRINZIDE,ZESTORETIC) 20-25 MG tablet Take 1 tablet by mouth daily.    Marland Kitchen LORazepam (ATIVAN) 0.5 MG tablet Take 1 tablet (0.5 mg total) by mouth every 6 (six) hours as needed (Nausea or vomiting). (Patient not taking: Reported on 05/03/2015) 30 tablet 0  . lovastatin (MEVACOR) 20 MG tablet Take 20 mg by mouth at  bedtime.    . omeprazole (PRILOSEC) 40 MG capsule Take 1 capsule (40 mg total) by mouth 2 (two) times daily. 60 capsule 0  . ondansetron (ZOFRAN) 8 MG tablet Take 1 tablet (8 mg total) by mouth 2 (two) times daily. Start the day after chemo for 3 days. Then as needed for nausea or vomiting. (Patient not taking: Reported on 03/22/2015) 30 tablet 1  . predniSONE (DELTASONE) 20 MG tablet Take 3 tablets a day ( in am ) with food x 5 days starting on day 1 of treatment plan 15 tablet 6  . prochlorperazine (COMPAZINE) 10 MG tablet Take 1 tablet (10 mg total) by mouth every 6 (six) hours as needed (Nausea or vomiting). (Patient not taking: Reported on 03/08/2015) 30  tablet 6  . sucralfate (CARAFATE) 1 g tablet Take 1 tablet (1 g total) by mouth 4 (four) times daily -  with meals and at bedtime. (Patient not taking: Reported on 05/03/2015) 90 tablet 0   No current facility-administered medications for this visit.    OBJECTIVE: Middle-aged African-American man Who appears well Filed Vitals:   05/24/15 0831  BP: 128/80  Pulse: 75  Temp: 97.7 F (36.5 C)  Resp: 18     Body mass index is 29.1 kg/(m^2).    ECOG FS:0 - Asymptomatic  Sclerae unicteric, pupils round and equal Oropharynx clear and moist-- no thrush or other lesions No cervical or supraclavicular adenopathy, no axillary or inguinal adenopathy Lungs no rales or rhonchi Heart regular rate and rhythm Abd soft, nontender, positive bowel sounds MSK no focal spinal tenderness, no upper extremity lymphedema Neuro: nonfocal, well oriented, appropriate affect   LAB RESULTS:  CMP     Component Value Date/Time   NA 140 05/24/2015 0804   NA 138 02/08/2015 0705   K 3.6 05/24/2015 0804   K 4.0 02/08/2015 0705   CL 102 02/08/2015 0705   CO2 23 05/24/2015 0804   CO2 27 02/08/2015 0705   GLUCOSE 105 05/24/2015 0804   GLUCOSE 115* 02/08/2015 0705   BUN 19.6 05/24/2015 0804   BUN 22* 02/08/2015 0705   CREATININE 1.1 05/24/2015 0804   CREATININE 1.11 02/08/2015 0705   CALCIUM 9.2 05/24/2015 0804   CALCIUM 8.9 02/08/2015 0705   PROT 6.8 05/24/2015 0804   PROT 5.4* 01/27/2015 0544   ALBUMIN 3.7 05/24/2015 0804   ALBUMIN 2.5* 01/27/2015 0544   AST 22 05/24/2015 0804   AST 24 01/27/2015 0544   ALT 26 05/24/2015 0804   ALT 23 01/27/2015 0544   ALKPHOS 58 05/24/2015 0804   ALKPHOS 55 01/27/2015 0544   BILITOT <0.30 05/24/2015 0804   BILITOT 0.6 01/27/2015 0544   GFRNONAA >60 02/08/2015 0705   GFRAA >60 02/08/2015 0705    INo results found for: SPEP, UPEP  Lab Results  Component Value Date   WBC 3.8* 05/24/2015   NEUTROABS 2.1 05/24/2015   HGB 11.5* 05/24/2015   HCT 35.1*  05/24/2015   MCV 85.2 05/24/2015   PLT 365 05/24/2015      Chemistry      Component Value Date/Time   NA 140 05/24/2015 0804   NA 138 02/08/2015 0705   K 3.6 05/24/2015 0804   K 4.0 02/08/2015 0705   CL 102 02/08/2015 0705   CO2 23 05/24/2015 0804   CO2 27 02/08/2015 0705   BUN 19.6 05/24/2015 0804   BUN 22* 02/08/2015 0705   CREATININE 1.1 05/24/2015 0804   CREATININE 1.11 02/08/2015 0705        Component Value Date/Time   CALCIUM 9.2 05/24/2015 0804   CALCIUM 8.9 02/08/2015 0705   ALKPHOS 58 05/24/2015 0804   ALKPHOS 55 01/27/2015 0544   AST 22 05/24/2015 0804   AST 24 01/27/2015 0544   ALT 26 05/24/2015 0804   ALT 23 01/27/2015 0544   BILITOT <0.30 05/24/2015 0804   BILITOT 0.6 01/27/2015 0544       No results found for: LABCA2  No components found for: LABCA125  No results for input(s): INR in the last 168 hours.  Urinalysis    Component Value Date/Time   COLORURINE YELLOW 01/23/2015 0805   APPEARANCEUR CLEAR 01/23/2015 0805   LABSPEC 1.014 01/23/2015 0805   PHURINE 7.5 01/23/2015 0805   GLUCOSEU NEGATIVE 01/23/2015 0805   HGBUR NEGATIVE 01/23/2015 0805   BILIRUBINUR NEGATIVE 01/23/2015 0805   KETONESUR NEGATIVE 01/23/2015 0805   PROTEINUR NEGATIVE 01/23/2015 0805   NITRITE NEGATIVE 01/23/2015 0805   LEUKOCYTESUR NEGATIVE 01/23/2015 0805  Results for Stella, Solmon R (MRN [id number]) as of 05/24/2015 09:26  Ref. Range 01/20/2015 15:31 02/09/2015 10:11 03/08/2015 13:02 05/03/2015 07:52 05/24/2015 08:04  LDH Latest Ref Range: 125-245 U/L 213 (H) 261 (H) 255 (H) 332 (H) 271 (H)    STUDIES: No results found.  ASSESSMENT: 64 y.o. [place] man with a diagnosis of mantle cell non-Hodgkin's lymphoma presenting with syncope secondary to bleeding from a large ulcerated duodenal ulcer  (a) s/p coil embolization of the feeding (gastro-duodenal) artery 01/23/2015  (b) anemia--scant iron stores on bone marrow biopsy--s/p feraheme 02/13/2015  (1) right inguinal lymph  node biopsy 01/22/2015 confirms mantle cell lymphoma  (a) bone marrow biopsy 02/06/2015 positive for involvement by the patient's mantle cell lymphoma  (b) IPI score of 5 (high risk) predicts a 5 year progression free survival of 50% with CHOP-Rituxan chemotherapy  (c) MIPI score of 5 (intermediate risk) predicts a median survival of 58 months  (2) CHOP/Rituxan started 02/09/2015  (3)  [facility] consultation 04/06/2015. Patient opted against transplant consolidation  PLAN: [name] [name] receive his sixth dose of CHOP/rituximab today. He has tolerated treatment exceedingly well and has had an excellent clinical response.  We discussed transplant issues. The main reason he does not want transplant is because it would involve his not being able to work for 6 months and he does not think his job would be there (and with his job goes his insurance) at the end of that.. Also he was deterred by the possible side effects including the risk of treatment-related death, and the uncertain results since after all the transplant is not a certain cure.  Accordingly we are going to restage him with a PET scan for the next cycle and I plan to give him a total of 8, 2 more cycles of CHOP/R. After that we [name] start rituximab maintenance.  He has a good understanding of this plan. He agrees with it. He [name] call with any problems that may develop before his next visit here.   , C, MD. 05/24/2015 

## 2015-05-24 NOTE — Patient Instructions (Signed)
[facility] Discharge Instructions for Patients Receiving Chemotherapy  Today you received the following chemotherapy agents:  Adriamycin, Vincristine, Cytoxan, Rituxan. To help prevent nausea and vomiting after your treatment, we encourage you to take your nausea medication as prescribed.  If you develop nausea and vomiting that is not controlled by your nausea medication, call the clinic.   BELOW ARE SYMPTOMS THAT SHOULD BE REPORTED IMMEDIATELY:  *FEVER GREATER THAN 100.5 F  *CHILLS WITH OR WITHOUT FEVER  NAUSEA AND VOMITING THAT IS NOT CONTROLLED WITH YOUR NAUSEA MEDICATION  *UNUSUAL SHORTNESS OF BREATH  *UNUSUAL BRUISING OR BLEEDING  TENDERNESS IN MOUTH AND THROAT WITH OR WITHOUT PRESENCE OF ULCERS  *URINARY PROBLEMS  *BOWEL PROBLEMS  UNUSUAL RASH Items with * indicate a potential emergency and should be followed up as soon as possible.  Feel free to call the clinic you have any questions or concerns. The clinic phone number is [phone].  Please show the CHEMO ALERT CARD at check-in to the Emergency Department and triage nurse.     

## 2015-06-11 ENCOUNTER — Other Ambulatory Visit: Payer: Self-pay | Admitting: Oncology

## 2015-06-11 ENCOUNTER — Encounter (HOSPITAL_COMMUNITY)
Admission: RE | Admit: 2015-06-11 | Discharge: 2015-06-11 | Disposition: A | Discharge status: Home / Self Care | Payer: BLUE CROSS/BLUE SHIELD | Source: Ambulatory Visit | Attending: Oncology | Admitting: Oncology

## 2015-06-11 DIAGNOSIS — C8319 Mantle cell lymphoma, extranodal and solid organ sites: Secondary | ICD-10-CM | POA: Insufficient documentation

## 2015-06-11 LAB — GLUCOSE, CAPILLARY: Glucose-Capillary: 100 mg/dL — ABNORMAL HIGH (ref 65–99)

## 2015-06-11 IMAGING — PT NM PET TUM IMG RESTAG (PS) SKULL BASE T - THIGH
1 of 7 series · 1 of 25 positions shown · non-contrast
Comparison: CT [DATE]

CLINICAL DATA: Subsequent treatment strategy for lymphoma.

EXAM:
NUCLEAR MEDICINE PET SKULL BASE TO THIGH
TECHNIQUE: 10.0 mCi F-18 FDG was injected intravenously. Full-ring PET imaging
was performed from the skull base to thigh after the radiotracer. CT
data was obtained and used for attenuation correction and anatomic
localization.
FASTING BLOOD GLUCOSE:  Value: 100 mg/dl

[Series 4: ct sk_thigh 5.0 b31f · axial · 5.0mm · 0.98mm/px · 1 of 220 slices shown]
[im 220/220  brain]
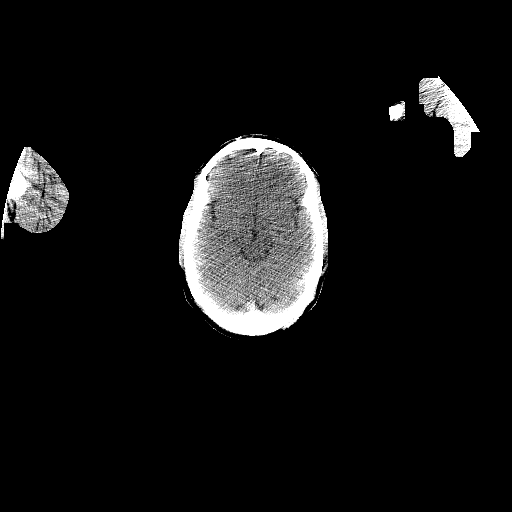

[1 of 25 positions shown; findings below may reference images not displayed]

FINDINGS: NECK

No hypermetabolic lymph nodes in the neck.

CHEST

No hypermetabolic mediastinal or hilar nodes. No suspicious
pulmonary nodules on the CT scan. Port in the RIGHT chest wall.

ABDOMEN/PELVIS

No abnormal hypermetabolic activity within the liver, pancreas,
adrenal glands, or spleen. No hypermetabolic lymph nodes in the
abdomen or pelvis. Small hypermetabolic focus associated with
subcutaneous inflammation and skin thickening along the inferior
LEFT gluteal fold (image 203 of the fused data set).

SKELETON

No focal hypermetabolic activity to suggest skeletal metastasis.
IMPRESSION: 1. No evidence lymphoma recurrence by  FDG PET imaging.
2. Subcutaneous infection along the inferior LEFT gluteal fold.

## 2015-06-11 MED ORDER — FLUDEOXYGLUCOSE F - 18 (FDG) INJECTION
9.9500 | Freq: Once | INTRAVENOUS | Status: AC | PRN
  Administered 2015-06-11: 9.95 via INTRAVENOUS

## 2015-06-11 MED ORDER — TECHNETIUM TC 99M MEDRONATE IV KIT: 25.0000 | PACK | Freq: Once | INTRAVENOUS | Status: DC | PRN

## 2015-06-14 ENCOUNTER — Other Ambulatory Visit (HOSPITAL_BASED_OUTPATIENT_CLINIC_OR_DEPARTMENT_OTHER): Payer: BLUE CROSS/BLUE SHIELD

## 2015-06-14 ENCOUNTER — Ambulatory Visit (HOSPITAL_BASED_OUTPATIENT_CLINIC_OR_DEPARTMENT_OTHER): Payer: BLUE CROSS/BLUE SHIELD | Admitting: Nurse Practitioner

## 2015-06-14 ENCOUNTER — Ambulatory Visit (HOSPITAL_BASED_OUTPATIENT_CLINIC_OR_DEPARTMENT_OTHER): Payer: BLUE CROSS/BLUE SHIELD

## 2015-06-14 ENCOUNTER — Encounter: Payer: Self-pay | Admitting: Nurse Practitioner

## 2015-06-14 VITALS — BP 135/87 | HR 72 | Temp 97.9°F | Resp 18 | Ht 70.0 in | Wt 200.2 lb

## 2015-06-14 VITALS — BP 122/71 | HR 76 | Temp 97.7°F | Resp 18

## 2015-06-14 DIAGNOSIS — C8315 Mantle cell lymphoma, lymph nodes of inguinal region and lower limb: Secondary | ICD-10-CM

## 2015-06-14 DIAGNOSIS — Z5111 Encounter for antineoplastic chemotherapy: Secondary | ICD-10-CM | POA: Diagnosis not present

## 2015-06-14 DIAGNOSIS — D649 Anemia, unspecified: Secondary | ICD-10-CM | POA: Diagnosis not present

## 2015-06-14 DIAGNOSIS — C8319 Mantle cell lymphoma, extranodal and solid organ sites: Secondary | ICD-10-CM

## 2015-06-14 DIAGNOSIS — Z5112 Encounter for antineoplastic immunotherapy: Secondary | ICD-10-CM

## 2015-06-14 DIAGNOSIS — K922 Gastrointestinal hemorrhage, unspecified: Secondary | ICD-10-CM

## 2015-06-14 LAB — CBC WITH DIFFERENTIAL/PLATELET
BASO%: 0.6 % (ref 0.0–2.0)
Basophils Absolute: 0 10*3/uL (ref 0.0–0.1)
EOS%: 0.6 % (ref 0.0–7.0)
Eosinophils Absolute: 0 10*3/uL (ref 0.0–0.5)
HCT: 34.1 % — ABNORMAL LOW (ref 38.4–49.9)
HGB: 11.1 g/dL — ABNORMAL LOW (ref 13.0–17.1)
LYMPH%: 7.6 % — AB (ref 14.0–49.0)
MCH: 28.1 pg (ref 27.2–33.4)
MCHC: 32.6 g/dL (ref 32.0–36.0)
MCV: 86.3 fL (ref 79.3–98.0)
MONO#: 0.3 10*3/uL (ref 0.1–0.9)
MONO%: 5 % (ref 0.0–14.0)
NEUT#: 4.6 10*3/uL (ref 1.5–6.5)
NEUT%: 86.2 % — AB (ref 39.0–75.0)
Platelets: 416 10*3/uL — ABNORMAL HIGH (ref 140–400)
RBC: 3.95 10*6/uL — AB (ref 4.20–5.82)
RDW: 15.8 % — ABNORMAL HIGH (ref 11.0–14.6)
WBC: 5.4 10*3/uL (ref 4.0–10.3)
lymph#: 0.4 10*3/uL — ABNORMAL LOW (ref 0.9–3.3)

## 2015-06-14 LAB — COMPREHENSIVE METABOLIC PANEL
ALT: 18 U/L (ref 0–55)
ANION GAP: 9 meq/L (ref 3–11)
AST: 17 U/L (ref 5–34)
Albumin: 3.9 g/dL (ref 3.5–5.0)
Alkaline Phosphatase: 61 U/L (ref 40–150)
BUN: 20.9 mg/dL (ref 7.0–26.0)
CALCIUM: 9.5 mg/dL (ref 8.4–10.4)
CHLORIDE: 104 meq/L (ref 98–109)
CO2: 26 meq/L (ref 22–29)
Creatinine: 1.2 mg/dL (ref 0.7–1.3)
EGFR: 75 mL/min/{1.73_m2} — ABNORMAL LOW (ref >90)
Glucose: 115 mg/dl (ref 70–140)
POTASSIUM: 4.2 meq/L (ref 3.5–5.1)
Sodium: 140 mEq/L (ref 136–145)
Total Bilirubin: 0.34 mg/dL (ref 0.20–1.20)
Total Protein: 7.4 g/dL (ref 6.4–8.3)

## 2015-06-14 LAB — LACTATE DEHYDROGENASE: LDH: 316 U/L — AB (ref 125–245)

## 2015-06-14 MED ORDER — SODIUM CHLORIDE 0.9 % IV SOLN
Freq: Once | INTRAVENOUS | Status: AC
  Administered 2015-06-14: 11:00:00 via INTRAVENOUS
  Filled 2015-06-14: qty 8

## 2015-06-14 MED ORDER — SODIUM CHLORIDE 0.9 % IJ SOLN
10.0000 mL | INTRAMUSCULAR | Status: DC | PRN
  Administered 2015-06-14: 10 mL
  Filled 2015-06-14: qty 10

## 2015-06-14 MED ORDER — SODIUM CHLORIDE 0.9 % IV SOLN
Freq: Once | INTRAVENOUS | Status: AC
  Administered 2015-06-14: 11:00:00 via INTRAVENOUS

## 2015-06-14 MED ORDER — ACETAMINOPHEN 325 MG PO TABS
650.0000 mg | ORAL_TABLET | Freq: Once | ORAL | Status: AC
  Administered 2015-06-14: 650 mg via ORAL

## 2015-06-14 MED ORDER — SODIUM CHLORIDE 0.9 % IV SOLN
750.0000 mg/m2 | Freq: Once | INTRAVENOUS | Status: AC
  Administered 2015-06-14: 1640 mg via INTRAVENOUS
  Filled 2015-06-14: qty 82

## 2015-06-14 MED ORDER — DIPHENHYDRAMINE HCL 25 MG PO CAPS
ORAL_CAPSULE | ORAL | Status: AC
  Filled 2015-06-14: qty 1

## 2015-06-14 MED ORDER — VINCRISTINE SULFATE CHEMO INJECTION 1 MG/ML
2.0000 mg | Freq: Once | INTRAVENOUS | Status: AC
  Administered 2015-06-14: 2 mg via INTRAVENOUS
  Filled 2015-06-14: qty 2

## 2015-06-14 MED ORDER — ACETAMINOPHEN 325 MG PO TABS
ORAL_TABLET | ORAL | Status: AC
  Filled 2015-06-14: qty 2

## 2015-06-14 MED ORDER — DOXORUBICIN HCL CHEMO IV INJECTION 2 MG/ML
50.0000 mg/m2 | Freq: Once | INTRAVENOUS | Status: AC
  Administered 2015-06-14: 110 mg via INTRAVENOUS
  Filled 2015-06-14: qty 55

## 2015-06-14 MED ORDER — PEGFILGRASTIM 6 MG/0.6ML ~~LOC~~ PSKT
6.0000 mg | PREFILLED_SYRINGE | Freq: Once | SUBCUTANEOUS | Status: AC
  Administered 2015-06-14: 6 mg via SUBCUTANEOUS
  Filled 2015-06-14: qty 0.6

## 2015-06-14 MED ORDER — SODIUM CHLORIDE 0.9 % IV SOLN
375.0000 mg/m2 | Freq: Once | INTRAVENOUS | Status: AC
  Administered 2015-06-14: 800 mg via INTRAVENOUS
  Filled 2015-06-14: qty 70

## 2015-06-14 MED ORDER — DIPHENHYDRAMINE HCL 25 MG PO CAPS
25.0000 mg | ORAL_CAPSULE | Freq: Once | ORAL | Status: AC
  Administered 2015-06-14: 25 mg via ORAL

## 2015-06-14 MED ORDER — HEPARIN SOD (PORK) LOCK FLUSH 100 UNIT/ML IV SOLN
500.0000 [IU] | Freq: Once | INTRAVENOUS | Status: AC | PRN
  Administered 2015-06-14: 500 [IU]
  Filled 2015-06-14: qty 5

## 2015-06-14 NOTE — Patient Instructions (Signed)
[facility] Discharge Instructions for Patients Receiving Chemotherapy  Today you received the following chemotherapy agents Adriamycin/Vincristine/Cytoxan/Rituxan To help prevent nausea and vomiting after your treatment, we encourage you to take your nausea medication as prescribed.   If you develop nausea and vomiting that is not controlled by your nausea medication, call the clinic.   BELOW ARE SYMPTOMS THAT SHOULD BE REPORTED IMMEDIATELY:  *FEVER GREATER THAN 100.5 F  *CHILLS WITH OR WITHOUT FEVER  NAUSEA AND VOMITING THAT IS NOT CONTROLLED WITH YOUR NAUSEA MEDICATION  *UNUSUAL SHORTNESS OF BREATH  *UNUSUAL BRUISING OR BLEEDING  TENDERNESS IN MOUTH AND THROAT WITH OR WITHOUT PRESENCE OF ULCERS  *URINARY PROBLEMS  *BOWEL PROBLEMS  UNUSUAL RASH Items with * indicate a potential emergency and should be followed up as soon as possible.  Feel free to call the clinic you have any questions or concerns. The clinic phone number is [phone].  Please show the CHEMO ALERT CARD at check-in to the Emergency Department and triage nurse.    

## 2015-06-14 NOTE — Progress Notes (Signed)
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 He "blew this off" and it did not happen again until 01/17/2015 when he felt weak at work and later fainted at home. He was brought to the ED where he was found to be guaiac positive (denies melena before that date) with a Hb of 4.5 MCV 82.2, platelets 295K, WBC 9.1 and normal INR and PTT. He was admitted and underwent EGD under Dr Michail Sermon 01/18/2015 showing a very large duodenal ulcer w clot. Abdominal US 01/19/2015 was not very informative but CT abd/pelvis 12/17,2016 showed adenopathy in the anterior mediastinum, retroperitoneum and pelvis, with a large ulcerated mass involving the distal stomach/proximal duodenum and extending into the gastrohepatic region.   On 01/22/2015 he underwent right inguinal lymph node biopsy, consistent with mantle cell lymphoma (([id number] and [id number])). Specifically the lymph node showed effacement of the architecture by sheets of small to medium sized lymphocytes with no apparent nodularity. The cells were positive for CD20, CD5, BCL-2, CD21, and cyclin D1. He 67 was low (10-20%). CD21 revealed a few residual germinal centers area at the B cells were negative for CD10, and  BCL-6. CD23 was negative.  On 02/06/2015 the patient underwent a bone marrow aspirate and biopsy. The pathology from this procedure (FZB 17-2 and 17-7) was positive for involvement by non-Hodgkin's lymphoma with numerous atypical interstitial and paratrabecular lymphoid aggregates consistent with mantle cell lymphoma. Iron stain showed scant iron flow cytometry confirmed a monoclonal B-cell population, lambda restricted, consistent with the patient's known diagnosis of mantle cell lymphoma.  On 01/18/2015 the patient underwent EGD which found a massive duodenal bulb ulcer with adherent clot, although no active bleeding.his hemoglobin was in the 8.4 range and stable. He was discharged on 01/22/2015, but within 24 hours was readmitted with syncope and altered mental status. He had aspirated and required intubation for his respiratory failure. Hemoglobin was down to 6.3. An NG tube was placed, the patient was multiply transfused, and Surgery was considered but felt not to be feasible.accordingly the patient underwent arteriography 01/23/2015 which found the superior mesenteric and celiac arteries not to be the involved vessels. The gastroduodenal artery showed multiple branches providing supply to the hypervascular duodenal mass, and this artery was coil embolized.. Also a port was placed at the time of this procedure with a view to eventual chemotherapy.  He was discharged 01/26/2015,but required readmission 02/07/2015 for another presyncopal episode. He presented to the emergency room where he was found to have a hemoglobin of 7.5. The patient was transfusedIIA hemoglobin of 10 and was discharged 02/07/2014. He was scheduled to start CHOP right toxin 02/08/2014.  His subsequent history is as detailed below.  INTERVAL HISTORY: [name] returns today for treatment of his mantle cell lymphoma. Today is day 1, cycle 7 of 8 planned cycles of CHOP/rituximab, given every  3 weeks. He had a repeat PET scan performed  last week.   REVIEW OF SYSTEMS: [name] in great spirits. He has great energy and works out near daily. He is eating well and maintaining his weight. He stays well hydrated. He denies fevers, chills, nausea, or vomiting. He has loose stools acutely after treatment, but this resolves quickly. He is in no pain. He denies mouth sores, rashes, or neuropathy symptoms. A detailed review of systems is otherwise stable.  PAST MEDICAL HISTORY: Past Medical History  Diagnosis Date  . Essential hypertension   . HLD (hyperlipidemia)   . GERD (gastroesophageal reflux disease)   . Anemia   . Cancer ([place])     Lymphoma  . GI bleed   . Weakness     PAST SURGICAL HISTORY: Past Surgical History  Procedure Laterality Date  . Skin surgery      Small benign cysts over left scalp removed  . Esophagogastroduodenoscopy Left 01/18/2015    Procedure: ESOPHAGOGASTRODUODENOSCOPY (EGD);  Surgeon: Wilford Corner, MD;  Location: [facility] ENDOSCOPY;  Service: Endoscopy;  Laterality: Left;  . Inguinal hernia repair Right 01/22/2015    Procedure: RIGHT INGUINAL LYMPH NODE BX;  Surgeon: Donnie Mesa, MD;  Location: [place];  Service: General;  Laterality: Right;  . Portacath placement  01/26/2015     power port with tip SVC/RA Junction    FAMILY HISTORY Family History  Problem Relation Age of Onset  . Hypertension Mother   . Hypertension Father   . Hypertension Sister   . Diabetes Sister   . Prostate cancer Brother   . Lupus Sister   . Kidney failure Father   The patient's father died from CHF complications age 39. The patient's mother is 76 y/o as of December 2016. The patient has 9 brothers, 8 sisters. One brother has prostate cancer. There is no other cancer history in the family to his knowledge.  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 His wife of 60 years, [name], is disabled due to RA. Daughtr [name] works as a Teacher, early years/pre for [employer] (she previously worked at  the [employer] in records). Son [name] owns a window washing business. Son [name] is in Press photographer at [employer]. All live in [place]. The patient has 3 grandchildren. He attends a local [place] DIRECTIVES: Not in place   HEALTH MAINTENANCE: Social History  Substance Use Topics  . Smoking status: Never Smoker   . Smokeless tobacco: Never Used  . Alcohol Use: Yes     Comment: socially     Colonoscopy:  PSA: 2.27 on 01/22/2015  Hepatitis serologies (be surface antigen, hepatitis B and hepatitis see IgM) negative 01/18/2015  No Known Allergies  Current Outpatient Prescriptions  Medication Sig Dispense Refill  . lisinopril-hydrochlorothiazide (PRINZIDE,ZESTORETIC) 20-25 MG tablet Take 1 tablet by mouth daily.    Marland Kitchen lovastatin (MEVACOR) 20 MG tablet Take 20 mg by mouth at bedtime.    . predniSONE (DELTASONE) 20 MG tablet Take 3 tablets a day ( in am ) with food x 5 days starting on day 1 of treatment plan 15 tablet 6  . LORazepam (ATIVAN) 0.5 MG tablet Take 1 tablet (0.5 mg total) by mouth every 6 (six) hours as needed (Nausea or vomiting). (Patient not taking: Reported on 05/03/2015) 30 tablet 0  . omeprazole (PRILOSEC) 40 MG capsule Take 1 capsule (40 mg total) by mouth 2 (two) times daily. (Patient not  taking: Reported on 06/14/2015) 60 capsule 0   No current facility-administered medications for this visit.    OBJECTIVE: Middle-aged African-American man Who appears well Filed Vitals:   06/14/15 1018  BP: 135/87  Pulse: 72  Temp: 97.9 F (36.6 C)  Resp: 18     Body mass index is 28.73 kg/(m^2).    ECOG FS:0 - Asymptomatic  Skin: warm, dry  HEENT: sclerae anicteric, conjunctivae pink, oropharynx clear. No thrush or mucositis.  Lymph Nodes: No cervical or supraclavicular lymphadenopathy  Lungs: clear to auscultation bilaterally, no rales, wheezes, or rhonci  Heart: regular rate and rhythm  Abdomen: round, soft, non tender, positive bowel sounds    Musculoskeletal: No focal spinal tenderness, no peripheral edema  Neuro: non focal, well oriented, positive affect   LAB RESULTS:  CMP     Component Value Date/Time   NA 140 06/14/2015 1006   NA 138 02/08/2015 0705   K 4.2 06/14/2015 1006   K 4.0 02/08/2015 0705   CL 102 02/08/2015 0705   CO2 26 06/14/2015 1006   CO2 27 02/08/2015 0705   GLUCOSE 115 06/14/2015 1006   GLUCOSE 115* 02/08/2015 0705   BUN 20.9 06/14/2015 1006   BUN 22* 02/08/2015 0705   CREATININE 1.2 06/14/2015 1006   CREATININE 1.11 02/08/2015 0705   CALCIUM 9.5 06/14/2015 1006   CALCIUM 8.9 02/08/2015 0705   PROT 7.4 06/14/2015 1006   PROT 5.4* 01/27/2015 0544   ALBUMIN 3.9 06/14/2015 1006   ALBUMIN 2.5* 01/27/2015 0544   AST 17 06/14/2015 1006   AST 24 01/27/2015 0544   ALT 18 06/14/2015 1006   ALT 23 01/27/2015 0544   ALKPHOS 61 06/14/2015 1006   ALKPHOS 55 01/27/2015 0544   BILITOT 0.34 06/14/2015 1006   BILITOT 0.6 01/27/2015 0544   GFRNONAA >60 02/08/2015 0705   GFRAA >60 02/08/2015 0705    INo results found for: SPEP, UPEP  Lab Results  Component Value Date   WBC 5.4 06/14/2015   NEUTROABS 4.6 06/14/2015   HGB 11.1* 06/14/2015   HCT 34.1* 06/14/2015   MCV 86.3 06/14/2015   PLT 416* 06/14/2015      Chemistry      Component Value Date/Time   NA 140 06/14/2015 1006   NA 138 02/08/2015 0705   K 4.2 06/14/2015 1006   K 4.0 02/08/2015 0705   CL 102 02/08/2015 0705   CO2 26 06/14/2015 1006   CO2 27 02/08/2015 0705   BUN 20.9 06/14/2015 1006   BUN 22* 02/08/2015 0705   CREATININE 1.2 06/14/2015 1006   CREATININE 1.11 02/08/2015 0705      Component Value Date/Time   CALCIUM 9.5 06/14/2015 1006   CALCIUM 8.9 02/08/2015 0705   ALKPHOS 61 06/14/2015 1006   ALKPHOS 55 01/27/2015 0544   AST 17 06/14/2015 1006   AST 24 01/27/2015 0544   ALT 18 06/14/2015 1006   ALT 23 01/27/2015 0544   BILITOT 0.34 06/14/2015 1006   BILITOT 0.6 01/27/2015 0544       No results found for:  LABCA2  No components found for: [id number]  No results for input(s): INR in the last 168 hours.  Urinalysis    Component Value Date/Time   COLORURINE YELLOW 01/23/2015 0805   APPEARANCEUR CLEAR 01/23/2015 0805   LABSPEC 1.014 01/23/2015 0805   PHURINE 7.5 01/23/2015 0805   GLUCOSEU NEGATIVE 01/23/2015 0805   HGBUR NEGATIVE 01/23/2015 0805   BILIRUBINUR NEGATIVE 01/23/2015 0805   KETONESUR NEGATIVE 01/23/2015 0805  PROTEINUR NEGATIVE 01/23/2015 0805   NITRITE NEGATIVE 01/23/2015 0805   LEUKOCYTESUR NEGATIVE 01/23/2015 0805  Results for LETRELL, ATTWOOD (MRN [id number]) as of 05/24/2015 09:26  Ref. Range 01/20/2015 15:31 02/09/2015 10:11 03/08/2015 13:02 05/03/2015 07:52 05/24/2015 08:04  LDH Latest Ref Range: 125-245 U/L 213 (H) 261 (H) 255 (H) 332 (H) 271 (H)    STUDIES: Nm Pet Image Restag (ps) Skull Base To Thigh  06/11/2015  CLINICAL DATA:  Subsequent treatment strategy for lymphoma. EXAM: NUCLEAR MEDICINE PET SKULL BASE TO THIGH TECHNIQUE: 10.0 mCi F-18 FDG was injected intravenously. Full-ring PET imaging was performed from the skull base to thigh after the radiotracer. CT data was obtained and used for attenuation correction and anatomic localization. FASTING BLOOD GLUCOSE:  Value: 100 mg/dl COMPARISON:  CT 03/21/2015 FINDINGS: NECK No hypermetabolic lymph nodes in the neck. CHEST No hypermetabolic mediastinal or hilar nodes. No suspicious pulmonary nodules on the CT scan. Port in the RIGHT chest wall. ABDOMEN/PELVIS No abnormal hypermetabolic activity within the liver, pancreas, adrenal glands, or spleen. No hypermetabolic lymph nodes in the abdomen or pelvis. Small hypermetabolic focus associated with subcutaneous inflammation and skin thickening along the inferior LEFT gluteal fold (image 203 of the fused data set). SKELETON No focal hypermetabolic activity to suggest skeletal metastasis. IMPRESSION: 1. No evidence lymphoma recurrence by  FDG PET imaging. 2. Subcutaneous infection along  the inferior LEFT gluteal fold. Electronically Signed   By: Suzy Bouchard M.D.   On: 06/11/2015 12:48    ASSESSMENT: 65 y.o. [place] man with a diagnosis of mantle cell non-Hodgkin's lymphoma presenting with syncope secondary to bleeding from a large ulcerated duodenal ulcer  (a) s/p coil embolization of the feeding (gastro-duodenal) artery 01/23/2015  (b) anemia--scant iron stores on bone marrow biopsy--s/p feraheme 02/13/2015  (1) right inguinal lymph node biopsy 01/22/2015 confirms mantle cell lymphoma  (a) bone marrow biopsy 02/06/2015 positive for involvement by the patient's mantle cell lymphoma  (b) IPI score of 5 (high risk) predicts a 5 year progression free survival of 50% with CHOP-Rituxan chemotherapy  (c) MIPI score of 5 (intermediate risk) predicts a median survival of 58 months  (2) CHOP/Rituxan started 02/09/2015  (3)  [facility] consultation 04/06/2015. Patient opted against transplant consolidation  PLAN: [name] and I reviewed the PET scan. He is delighted to hear there is no evidence of lymphoma recurrence. Accordingly he [name] finish out these last 2 cycles of RCHOP. According to Dr. Virgie Dad last progress note he [name] likely move on to maintenance rituximab therapy. The labs were reviewed in detail and were stable. He has continued mild anemia, but he is asymptomatic.   The PET scan also showed a subcutaneous infection along the left inferior gluteal fold. This was a boil and has already drained according to [name].   [name] return in 3 weeks for cycle 8 of RCHOP. He understands and agree with this plan. He knows the goal of treatment in his case is control. He has been encouraged to call with any issues that might arise before his next visit here.    Laurie Panda, NP. 06/14/2015

## 2015-06-14 NOTE — Progress Notes (Signed)
Positive blood return noted before, during and at end of Adriamycin administration.

## 2015-06-22 ENCOUNTER — Encounter: Payer: Self-pay | Admitting: Oncology

## 2015-06-22 NOTE — Progress Notes (Signed)
Fax sent 03/02/15 (2) I sent to medical recrds

## 2015-07-05 ENCOUNTER — Ambulatory Visit (HOSPITAL_BASED_OUTPATIENT_CLINIC_OR_DEPARTMENT_OTHER): Payer: BLUE CROSS/BLUE SHIELD

## 2015-07-05 ENCOUNTER — Other Ambulatory Visit (HOSPITAL_BASED_OUTPATIENT_CLINIC_OR_DEPARTMENT_OTHER): Payer: BLUE CROSS/BLUE SHIELD

## 2015-07-05 ENCOUNTER — Ambulatory Visit (HOSPITAL_BASED_OUTPATIENT_CLINIC_OR_DEPARTMENT_OTHER): Payer: BLUE CROSS/BLUE SHIELD | Admitting: Oncology

## 2015-07-05 ENCOUNTER — Telehealth: Payer: Self-pay | Admitting: Oncology

## 2015-07-05 VITALS — BP 107/71 | HR 66 | Temp 97.9°F | Resp 18

## 2015-07-05 VITALS — BP 124/68 | HR 73 | Temp 98.3°F | Resp 18 | Ht 70.0 in | Wt 201.5 lb

## 2015-07-05 DIAGNOSIS — Z5111 Encounter for antineoplastic chemotherapy: Secondary | ICD-10-CM

## 2015-07-05 DIAGNOSIS — C8319 Mantle cell lymphoma, extranodal and solid organ sites: Secondary | ICD-10-CM

## 2015-07-05 DIAGNOSIS — K922 Gastrointestinal hemorrhage, unspecified: Secondary | ICD-10-CM | POA: Diagnosis not present

## 2015-07-05 LAB — CBC WITH DIFFERENTIAL/PLATELET
BASO%: 1 % (ref 0.0–2.0)
BASOS ABS: 0 10*3/uL (ref 0.0–0.1)
EOS ABS: 0 10*3/uL (ref 0.0–0.5)
EOS%: 0.8 % (ref 0.0–7.0)
HEMATOCRIT: 34.8 % — AB (ref 38.4–49.9)
HGB: 11.2 g/dL — ABNORMAL LOW (ref 13.0–17.1)
LYMPH#: 0.6 10*3/uL — AB (ref 0.9–3.3)
LYMPH%: 16.2 % (ref 14.0–49.0)
MCH: 28 pg (ref 27.2–33.4)
MCHC: 32.3 g/dL (ref 32.0–36.0)
MCV: 86.6 fL (ref 79.3–98.0)
MONO#: 0.5 10*3/uL (ref 0.1–0.9)
MONO%: 15.8 % — ABNORMAL HIGH (ref 0.0–14.0)
NEUT#: 2.3 10*3/uL (ref 1.5–6.5)
NEUT%: 66.2 % (ref 39.0–75.0)
PLATELETS: 337 10*3/uL (ref 140–400)
RBC: 4.02 10*6/uL — ABNORMAL LOW (ref 4.20–5.82)
RDW: 15.8 % — ABNORMAL HIGH (ref 11.0–14.6)
WBC: 3.4 10*3/uL — ABNORMAL LOW (ref 4.0–10.3)

## 2015-07-05 LAB — COMPREHENSIVE METABOLIC PANEL
ALBUMIN: 3.9 g/dL (ref 3.5–5.0)
ALK PHOS: 57 U/L (ref 40–150)
ALT: 24 U/L (ref 0–55)
ANION GAP: 11 meq/L (ref 3–11)
AST: 24 U/L (ref 5–34)
BUN: 20.4 mg/dL (ref 7.0–26.0)
CALCIUM: 9.4 mg/dL (ref 8.4–10.4)
CHLORIDE: 103 meq/L (ref 98–109)
CO2: 24 mEq/L (ref 22–29)
Creatinine: 1.1 mg/dL (ref 0.7–1.3)
EGFR: 83 mL/min/{1.73_m2} — ABNORMAL LOW (ref >90)
Glucose: 103 mg/dl (ref 70–140)
POTASSIUM: 3.9 meq/L (ref 3.5–5.1)
Sodium: 138 mEq/L (ref 136–145)
Total Bilirubin: 0.33 mg/dL (ref 0.20–1.20)
Total Protein: 7.1 g/dL (ref 6.4–8.3)

## 2015-07-05 LAB — LACTATE DEHYDROGENASE: LDH: 328 U/L — AB (ref 125–245)

## 2015-07-05 MED ORDER — SODIUM CHLORIDE 0.9 % IV SOLN
Freq: Once | INTRAVENOUS | Status: AC
  Administered 2015-07-05: 09:00:00 via INTRAVENOUS
  Filled 2015-07-05: qty 8

## 2015-07-05 MED ORDER — ACETAMINOPHEN 325 MG PO TABS
650.0000 mg | ORAL_TABLET | Freq: Once | ORAL | Status: AC
  Administered 2015-07-05: 650 mg via ORAL

## 2015-07-05 MED ORDER — SODIUM CHLORIDE 0.9 % IV SOLN
750.0000 mg/m2 | Freq: Once | INTRAVENOUS | Status: AC
  Administered 2015-07-05: 1640 mg via INTRAVENOUS
  Filled 2015-07-05: qty 82

## 2015-07-05 MED ORDER — DIPHENHYDRAMINE HCL 25 MG PO CAPS
ORAL_CAPSULE | ORAL | Status: AC
Start: 2015-07-05 — End: 2015-07-05
  Filled 2015-07-05: qty 1

## 2015-07-05 MED ORDER — PEGFILGRASTIM 6 MG/0.6ML ~~LOC~~ PSKT
6.0000 mg | PREFILLED_SYRINGE | Freq: Once | SUBCUTANEOUS | Status: AC
  Administered 2015-07-05: 6 mg via SUBCUTANEOUS
  Filled 2015-07-05: qty 0.6

## 2015-07-05 MED ORDER — SODIUM CHLORIDE 0.9 % IV SOLN
Freq: Once | INTRAVENOUS | Status: AC
  Administered 2015-07-05: 09:00:00 via INTRAVENOUS

## 2015-07-05 MED ORDER — SODIUM CHLORIDE 0.9 % IJ SOLN
10.0000 mL | INTRAMUSCULAR | Status: DC | PRN
  Administered 2015-07-05: 10 mL
  Filled 2015-07-05: qty 10

## 2015-07-05 MED ORDER — VINCRISTINE SULFATE CHEMO INJECTION 1 MG/ML
2.0000 mg | Freq: Once | INTRAVENOUS | Status: AC
  Administered 2015-07-05: 2 mg via INTRAVENOUS
  Filled 2015-07-05: qty 2

## 2015-07-05 MED ORDER — SODIUM CHLORIDE 0.9 % IV SOLN
375.0000 mg/m2 | Freq: Once | INTRAVENOUS | Status: AC
  Administered 2015-07-05: 800 mg via INTRAVENOUS
  Filled 2015-07-05: qty 70

## 2015-07-05 MED ORDER — DIPHENHYDRAMINE HCL 25 MG PO CAPS
25.0000 mg | ORAL_CAPSULE | Freq: Once | ORAL | Status: AC
  Administered 2015-07-05: 25 mg via ORAL

## 2015-07-05 MED ORDER — HEPARIN SOD (PORK) LOCK FLUSH 100 UNIT/ML IV SOLN
500.0000 [IU] | Freq: Once | INTRAVENOUS | Status: AC | PRN
  Administered 2015-07-05: 500 [IU]
  Filled 2015-07-05: qty 5

## 2015-07-05 MED ORDER — DOXORUBICIN HCL CHEMO IV INJECTION 2 MG/ML
50.0000 mg/m2 | Freq: Once | INTRAVENOUS | Status: AC
  Administered 2015-07-05: 110 mg via INTRAVENOUS
  Filled 2015-07-05: qty 55

## 2015-07-05 MED ORDER — ACETAMINOPHEN 325 MG PO TABS
ORAL_TABLET | ORAL | Status: AC
  Filled 2015-07-05: qty 2

## 2015-07-05 NOTE — Telephone Encounter (Signed)
appt made and avs printed °

## 2015-07-05 NOTE — Patient Instructions (Signed)
[facility] Discharge Instructions for Patients Receiving Chemotherapy  Today you received the following chemotherapy agents Adriamycin/Vincristine/Cytoxan/Rituxan To help prevent nausea and vomiting after your treatment, we encourage you to take your nausea medication as prescribed.   If you develop nausea and vomiting that is not controlled by your nausea medication, call the clinic.   BELOW ARE SYMPTOMS THAT SHOULD BE REPORTED IMMEDIATELY:  *FEVER GREATER THAN 100.5 F  *CHILLS WITH OR WITHOUT FEVER  NAUSEA AND VOMITING THAT IS NOT CONTROLLED WITH YOUR NAUSEA MEDICATION  *UNUSUAL SHORTNESS OF BREATH  *UNUSUAL BRUISING OR BLEEDING  TENDERNESS IN MOUTH AND THROAT WITH OR WITHOUT PRESENCE OF ULCERS  *URINARY PROBLEMS  *BOWEL PROBLEMS  UNUSUAL RASH Items with * indicate a potential emergency and should be followed up as soon as possible.  Feel free to call the clinic you have any questions or concerns. The clinic phone number is [phone].  Please show the CHEMO ALERT CARD at check-in to the Emergency Department and triage nurse.    

## 2015-07-05 NOTE — Progress Notes (Signed)
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 He "blew this off" and it did not happen again until 01/17/2015 when he felt weak at work and later fainted at home. He was brought to the ED where he was found to be guaiac positive (denies melena before that date) with a Hb of 4.5 MCV 82.2, platelets 295K, WBC 9.1 and normal INR and PTT. He was admitted and underwent EGD under Dr Michail Sermon 01/18/2015 showing a very large duodenal ulcer w clot. Abdominal US 01/19/2015 was not very informative but CT abd/pelvis 12/17,2016 showed adenopathy in the anterior mediastinum, retroperitoneum and pelvis, with a large ulcerated mass involving the distal stomach/proximal duodenum and extending into the gastrohepatic region.   On 01/22/2015 he underwent right inguinal lymph node biopsy, consistent with mantle cell lymphoma (([id number] and [id number])). Specifically the lymph node showed effacement of the architecture by sheets of small to medium sized lymphocytes with no apparent nodularity. The cells were positive for CD20, CD5, BCL-2, CD21, and cyclin D1. He 63 was low (10-20%). CD21 revealed a few residual germinal centers area at the B cells were negative for CD10, and  BCL-6. CD23 was negative.  On 02/06/2015 the patient underwent a bone marrow aspirate and biopsy. The pathology from this procedure (FZB 17-2 and 17-7) was positive for involvement by non-Hodgkin's lymphoma with numerous atypical interstitial and paratrabecular lymphoid aggregates consistent with mantle cell lymphoma. Iron stain showed scant iron flow cytometry confirmed a monoclonal B-cell population, lambda restricted, consistent with the patient's known diagnosis of mantle cell lymphoma.  On 01/18/2015 the patient underwent EGD which found a massive duodenal bulb ulcer with adherent clot, although no active bleeding.his hemoglobin was in the 8.4 range and stable. He was discharged on 01/22/2015, but within 24 hours was readmitted with syncope and altered mental status. He had aspirated and required intubation for his respiratory failure. Hemoglobin was down to 6.3. An NG tube was placed, the patient was multiply transfused, and Surgery was considered but felt not to be feasible.accordingly the patient underwent arteriography 01/23/2015 which found the superior mesenteric and celiac arteries not to be the involved vessels. The gastroduodenal artery showed multiple branches providing supply to the hypervascular duodenal mass, and this artery was coil embolized.. Also a port was placed at the time of this procedure with a view to eventual chemotherapy.  He was discharged 01/26/2015,but required readmission 02/07/2015 for another presyncopal episode. He presented to the emergency room where he was found to have a hemoglobin of 7.5. The patient was transfusedIIA hemoglobin of 10 and was discharged 02/07/2014. He was scheduled to start CHOP right toxin 02/08/2014.  His subsequent history is as detailed below.  INTERVAL HISTORY: [name] returns today for the final treatment treatment of his mantle cell lymphoma. Today is day 1, cycle 8 of 8 planned cycles of  CHOP/rituximab, given every 3 weeks. PET scan obtained  06/11/2015 showed no evidence of active disease  REVIEW OF SYSTEMS: [name] continues to do remarkably well. He is trying to get back into his regular exercise routine, and did 75 squats this morning. He ran a mile and a half, but felt really fatigued after that (he used to run 5 miles regularly). He has had no intercurrent fevers, no adenopathy, and no "B" symptoms. The gluteal boil that he had last time has completely resolved. A detailed review of systems today is otherwise stable  PAST MEDICAL HISTORY: Past Medical History  Diagnosis Date  . Essential hypertension   . HLD (hyperlipidemia)   . GERD (gastroesophageal reflux disease)   . Anemia   . Cancer (HCC)     Lymphoma  . GI bleed   . Weakness     PAST SURGICAL HISTORY: Past Surgical History  Procedure Laterality Date  . Skin surgery      Small benign cysts over left scalp removed  . Esophagogastroduodenoscopy Left 01/18/2015    Procedure: ESOPHAGOGASTRODUODENOSCOPY (EGD);  Surgeon: Charlott Rakes, MD;  Location: [facility] ENDOSCOPY;  Service: Endoscopy;  Laterality: Left;  . Inguinal hernia repair Right 01/22/2015    Procedure: RIGHT INGUINAL LYMPH NODE BX;  Surgeon: Manus Rudd, MD;  Location: [facility] OR;  Service: General;  Laterality: Right;  . Portacath placement  01/26/2015     power port with tip SVC/RA Junction    FAMILY HISTORY Family History  Problem Relation Age of Onset  . Hypertension Mother   . Hypertension Father   . Hypertension Sister   . Diabetes Sister   . Prostate cancer Brother   . Lupus Sister   . Kidney failure Father   The patient's father died from CHF complications age 86. The patient's mother is 43 y/o as of December 2016. The patient has 9 brothers, 8 sisters. One brother has prostate cancer. There is no other cancer history in the family to his knowledge.  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 His wife of 42 years, [name], is disabled due to RA.  Daughtr [name] works as a Nurse, children's for [employer] (she previously worked at the [employer] in records). Son [name] owns a window washing business. Son [name] is in Airline pilot at [employer]. All live in [place]. The patient has 3 grandchildren. He attends a [employer]    ADVANCED DIRECTIVES: Not in place   HEALTH MAINTENANCE: Social History  Substance Use Topics  . Smoking status: Never Smoker   . Smokeless tobacco: Never Used  . Alcohol Use: Yes     Comment: socially     Colonoscopy:  PSA: 2.27 on 01/22/2015  Hepatitis serologies (be surface antigen, hepatitis B and hepatitis see IgM) negative 01/18/2015  No Known Allergies  Current Outpatient Prescriptions  Medication Sig Dispense Refill  . lisinopril-hydrochlorothiazide (PRINZIDE,ZESTORETIC) 20-25 MG tablet Take 1 tablet by mouth daily.    Marland Kitchen LORazepam (ATIVAN) 0.5 MG tablet Take 1 tablet (0.5 mg total) by mouth every 6 (six) hours as needed (Nausea or vomiting). (Patient not taking: Reported on 05/03/2015) 30 tablet 0  . lovastatin (MEVACOR) 20 MG tablet Take 20 mg by mouth at bedtime.    Marland Kitchen omeprazole (PRILOSEC) 40 MG capsule Take 1 capsule (40 mg total) by mouth 2 (two) times daily. (Patient not taking: Reported on 06/14/2015) 60 capsule 0  . predniSONE (DELTASONE) 20 MG tablet Take 3 tablets  a day ( in am ) with food x 5 days starting on day 1 of treatment plan 15 tablet 6   No current facility-administered medications for this visit.    OBJECTIVE: Middle-aged African-American man In no acute distress Filed Vitals:   07/05/15 0759  BP: 124/68  Pulse: 73  Temp: 98.3 F (36.8 C)  Resp: 18     Body mass index is 28.91 kg/(m^2).    ECOG FS:0 - Asymptomatic  Sclerae unicteric, pupils round and equal Oropharynx clear and moist-- no thrush or other lesions No cervical or supraclavicular adenopathy, no axillary or inguinal adenopathy Lungs no rales or rhonchi Heart regular rate and rhythm Abd soft,  nontender, positive bowel sounds MSK no focal spinal tenderness, no upper extremity lymphedema Neuro: nonfocal, well oriented, appropriate affect  LAB RESULTS:  CMP     Component Value Date/Time   NA 140 06/14/2015 1006   NA 138 02/08/2015 0705   K 4.2 06/14/2015 1006   K 4.0 02/08/2015 0705   CL 102 02/08/2015 0705   CO2 26 06/14/2015 1006   CO2 27 02/08/2015 0705   GLUCOSE 115 06/14/2015 1006   GLUCOSE 115* 02/08/2015 0705   BUN 20.9 06/14/2015 1006   BUN 22* 02/08/2015 0705   CREATININE 1.2 06/14/2015 1006   CREATININE 1.11 02/08/2015 0705   CALCIUM 9.5 06/14/2015 1006   CALCIUM 8.9 02/08/2015 0705   PROT 7.4 06/14/2015 1006   PROT 5.4* 01/27/2015 0544   ALBUMIN 3.9 06/14/2015 1006   ALBUMIN 2.5* 01/27/2015 0544   AST 17 06/14/2015 1006   AST 24 01/27/2015 0544   ALT 18 06/14/2015 1006   ALT 23 01/27/2015 0544   ALKPHOS 61 06/14/2015 1006   ALKPHOS 55 01/27/2015 0544   BILITOT 0.34 06/14/2015 1006   BILITOT 0.6 01/27/2015 0544   GFRNONAA >60 02/08/2015 0705   GFRAA >60 02/08/2015 0705    INo results found for: SPEP, UPEP  Lab Results  Component Value Date   WBC 3.4* 07/05/2015   NEUTROABS 2.3 07/05/2015   HGB 11.2* 07/05/2015   HCT 34.8* 07/05/2015   MCV 86.6 07/05/2015   PLT 337 07/05/2015      Chemistry      Component Value Date/Time   NA 140 06/14/2015 1006   NA 138 02/08/2015 0705   K 4.2 06/14/2015 1006   K 4.0 02/08/2015 0705   CL 102 02/08/2015 0705   CO2 26 06/14/2015 1006   CO2 27 02/08/2015 0705   BUN 20.9 06/14/2015 1006   BUN 22* 02/08/2015 0705   CREATININE 1.2 06/14/2015 1006   CREATININE 1.11 02/08/2015 0705      Component Value Date/Time   CALCIUM 9.5 06/14/2015 1006   CALCIUM 8.9 02/08/2015 0705   ALKPHOS 61 06/14/2015 1006   ALKPHOS 55 01/27/2015 0544   AST 17 06/14/2015 1006   AST 24 01/27/2015 0544   ALT 18 06/14/2015 1006   ALT 23 01/27/2015 0544   BILITOT 0.34 06/14/2015 1006   BILITOT 0.6 01/27/2015 0544        No results found for: LABCA2  No components found for: [id number]  No results for input(s): INR in the last 168 hours.  Urinalysis    Component Value Date/Time   COLORURINE YELLOW 01/23/2015 0805   APPEARANCEUR CLEAR 01/23/2015 0805   LABSPEC 1.014 01/23/2015 0805   PHURINE 7.5 01/23/2015 0805   GLUCOSEU NEGATIVE 01/23/2015 0805   HGBUR NEGATIVE 01/23/2015 0805   BILIRUBINUR NEGATIVE 01/23/2015 0805   KETONESUR NEGATIVE  01/23/2015 0805   PROTEINUR NEGATIVE 01/23/2015 0805   NITRITE NEGATIVE 01/23/2015 0805   LEUKOCYTESUR NEGATIVE 01/23/2015 0805  Results for WIN, GUAJARDO (MRN [id number]) as of 07/05/2015 08:26  Ref. Range 02/09/2015 10:11 03/08/2015 13:02 05/03/2015 07:52 05/24/2015 08:04 06/14/2015 10:06  LDH Latest Ref Range: 125-245 U/L 261 (H) 255 (H) 332 (H) 271 (H) 316 (H)   STUDIES: Nm Pet Image Restag (ps) Skull Base To Thigh  06/11/2015  CLINICAL DATA:  Subsequent treatment strategy for lymphoma. EXAM: NUCLEAR MEDICINE PET SKULL BASE TO THIGH TECHNIQUE: 10.0 mCi F-18 FDG was injected intravenously. Full-ring PET imaging was performed from the skull base to thigh after the radiotracer. CT data was obtained and used for attenuation correction and anatomic localization. FASTING BLOOD GLUCOSE:  Value: 100 mg/dl COMPARISON:  CT 03/21/2015 FINDINGS: NECK No hypermetabolic lymph nodes in the neck. CHEST No hypermetabolic mediastinal or hilar nodes. No suspicious pulmonary nodules on the CT scan. Port in the RIGHT chest wall. ABDOMEN/PELVIS No abnormal hypermetabolic activity within the liver, pancreas, adrenal glands, or spleen. No hypermetabolic lymph nodes in the abdomen or pelvis. Small hypermetabolic focus associated with subcutaneous inflammation and skin thickening along the inferior LEFT gluteal fold (image 203 of the fused data set). SKELETON No focal hypermetabolic activity to suggest skeletal metastasis. IMPRESSION: 1. No evidence lymphoma recurrence by  FDG PET imaging. 2.  Subcutaneous infection along the inferior LEFT gluteal fold. Electronically Signed   By: Suzy Bouchard M.D.   On: 06/11/2015 12:48    ASSESSMENT: 65 y.o. [place] man with a diagnosis of mantle cell non-Hodgkin's lymphoma presenting with syncope secondary to bleeding from a large ulcerated duodenal ulcer  (a) s/p coil embolization of the feeding (gastro-duodenal) artery 01/23/2015  (b) anemia--scant iron stores on bone marrow biopsy--s/p feraheme 02/13/2015  (1) right inguinal lymph node biopsy 01/22/2015 confirms mantle cell lymphoma  (a) bone marrow biopsy 02/06/2015 positive for involvement by the patient's mantle cell lymphoma  (b) IPI score of 5 (high risk) predicts a 5 year progression free survival of 50% with CHOP-Rituxan chemotherapy  (c) MIPI score of 5 (intermediate risk) predicts a median survival of 58 months  (2) CHOP/Rituxan started 02/09/2015, completed 8 cycles 07/05/2015  (3)  [facility] consultation 04/06/2015. Patient opted against transplant consolidation  (4) rituximab maintenance to start 08/16/2015, repeat every 2 months  PLAN: [name] is completing his scheduled chemotherapy today. He has had an excellent response. Of course we are concerned that this may not be durable. We are going to start rituximab maintenance mid July. He [name] receive that every 8 weeks indefinitely.  In addition to review of systems physical exam and labs, including LDH and beta-2 microglobulin, we [name] restage him every 6 months for the next 2 years. His next PET scan [name] be due in November.  As far as is getting back into exercise is concerned, persistence is good but he does need to take it slowly otherwise she [name] injure himself we discussed specifically quad strengthening exercises today since the steroids he has received targeted those muscles particularly  At this point I'm delighted that well is doing so well. He knows to call for any problems that may develop before his next visit  here.     Chauncey Cruel, MD. 07/05/2015

## 2015-07-31 ENCOUNTER — Encounter: Payer: Self-pay | Admitting: Oncology

## 2015-07-31 NOTE — Progress Notes (Signed)
merit life form left in my box. 07/30/15

## 2015-08-01 ENCOUNTER — Encounter: Payer: Self-pay | Admitting: Oncology

## 2015-08-01 ENCOUNTER — Telehealth: Payer: Self-pay | Admitting: *Deleted

## 2015-08-01 ENCOUNTER — Telehealth: Payer: Self-pay | Admitting: Oncology

## 2015-08-01 NOTE — Progress Notes (Signed)
merit life form left in my box. 07/30/15- left for dr. Jana Hakim to sign

## 2015-08-01 NOTE — Telephone Encounter (Signed)
Per message from desk nurse moved 7/13 lab to week of 7/3. Spoke with patient re change and new lab date/time for 7/5 @ 4pm (time per patient). Per message 7/13 f/u and infusion to remain as scheduled - patient aware.

## 2015-08-01 NOTE — Telephone Encounter (Signed)
This RN obtained labs per pt's physical obtained at his employment. Mr [name] states " I was told that my PSA was high but I am sure it must be from something I ate ?"  Per MD- need to obtain recheck prior to next visit- request lab appointment to be changed to week prior to visit.  Request sent to scheduling and message left on pt's vm.

## 2015-08-02 ENCOUNTER — Encounter: Payer: Self-pay | Admitting: Oncology

## 2015-08-02 NOTE — Progress Notes (Signed)
merit life form left in my box. 07/30/15- left for dr. Jana Hakim to sign-faxed [phone] and mailed copy to patient and sent to medical recds

## 2015-08-08 ENCOUNTER — Other Ambulatory Visit (HOSPITAL_BASED_OUTPATIENT_CLINIC_OR_DEPARTMENT_OTHER): Payer: Self-pay

## 2015-08-08 DIAGNOSIS — C8319 Mantle cell lymphoma, extranodal and solid organ sites: Secondary | ICD-10-CM

## 2015-08-08 DIAGNOSIS — R972 Elevated prostate specific antigen [PSA]: Secondary | ICD-10-CM

## 2015-08-08 DIAGNOSIS — K922 Gastrointestinal hemorrhage, unspecified: Secondary | ICD-10-CM

## 2015-08-08 LAB — COMPREHENSIVE METABOLIC PANEL
ALBUMIN: 4.2 g/dL (ref 3.5–5.0)
ALT: 30 U/L (ref 0–55)
AST: 23 U/L (ref 5–34)
Alkaline Phosphatase: 52 U/L (ref 40–150)
Anion Gap: 10 mEq/L (ref 3–11)
BILIRUBIN TOTAL: 0.32 mg/dL (ref 0.20–1.20)
BUN: 19 mg/dL (ref 7.0–26.0)
CALCIUM: 9.4 mg/dL (ref 8.4–10.4)
CHLORIDE: 102 meq/L (ref 98–109)
CO2: 26 meq/L (ref 22–29)
CREATININE: 1 mg/dL (ref 0.7–1.3)
EGFR: 89 mL/min/{1.73_m2} — ABNORMAL LOW (ref >90)
GLUCOSE: 84 mg/dL (ref 70–140)
Potassium: 3.9 mEq/L (ref 3.5–5.1)
Sodium: 139 mEq/L (ref 136–145)
TOTAL PROTEIN: 7.2 g/dL (ref 6.4–8.3)

## 2015-08-08 LAB — CBC WITH DIFFERENTIAL/PLATELET
BASO%: 0.5 % (ref 0.0–2.0)
Basophils Absolute: 0 10*3/uL (ref 0.0–0.1)
EOS%: 1.8 % (ref 0.0–7.0)
Eosinophils Absolute: 0.1 10*3/uL (ref 0.0–0.5)
HCT: 35.1 % — ABNORMAL LOW (ref 38.4–49.9)
HGB: 11.3 g/dL — ABNORMAL LOW (ref 13.0–17.1)
LYMPH%: 41.7 % (ref 14.0–49.0)
MCH: 27.5 pg (ref 27.2–33.4)
MCHC: 32.3 g/dL (ref 32.0–36.0)
MCV: 85.3 fL (ref 79.3–98.0)
MONO#: 0.6 10*3/uL (ref 0.1–0.9)
MONO%: 17.7 % — AB (ref 0.0–14.0)
NEUT%: 38.3 % — AB (ref 39.0–75.0)
NEUTROS ABS: 1.3 10*3/uL — AB (ref 1.5–6.5)
PLATELETS: 403 10*3/uL — AB (ref 140–400)
RBC: 4.11 10*6/uL — ABNORMAL LOW (ref 4.20–5.82)
RDW: 15.3 % — ABNORMAL HIGH (ref 11.0–14.6)
WBC: 3.4 10*3/uL — AB (ref 4.0–10.3)
lymph#: 1.4 10*3/uL (ref 0.9–3.3)

## 2015-08-08 LAB — LACTATE DEHYDROGENASE: LDH: 261 U/L — ABNORMAL HIGH (ref 125–245)

## 2015-08-09 LAB — BETA 2 MICROGLOBULIN, SERUM: BETA 2: 1.6 mg/L (ref 0.6–2.4)

## 2015-08-10 ENCOUNTER — Other Ambulatory Visit: Payer: Self-pay | Admitting: *Deleted

## 2015-08-10 DIAGNOSIS — R972 Elevated prostate specific antigen [PSA]: Secondary | ICD-10-CM

## 2015-08-10 MED ORDER — LISINOPRIL-HYDROCHLOROTHIAZIDE 20-25 MG PO TABS: 1.0000 | ORAL_TABLET | Freq: Every day | ORAL | Status: DC

## 2015-08-11 LAB — PSA, TOTAL AND FREE
% Free PSA: 11.9 %
PROSTATE SPECIFIC AG, SERUM: 4.2 ng/mL — AB (ref 0.0–4.0)
PSA, FREE: 0.5 ng/mL

## 2015-08-16 ENCOUNTER — Telehealth: Payer: Self-pay | Admitting: Oncology

## 2015-08-16 ENCOUNTER — Ambulatory Visit (HOSPITAL_BASED_OUTPATIENT_CLINIC_OR_DEPARTMENT_OTHER): Payer: BLUE CROSS/BLUE SHIELD | Admitting: Oncology

## 2015-08-16 ENCOUNTER — Ambulatory Visit (HOSPITAL_BASED_OUTPATIENT_CLINIC_OR_DEPARTMENT_OTHER): Payer: BLUE CROSS/BLUE SHIELD

## 2015-08-16 ENCOUNTER — Other Ambulatory Visit: Payer: BLUE CROSS/BLUE SHIELD

## 2015-08-16 VITALS — BP 142/87 | HR 65 | Temp 98.0°F | Resp 18 | Ht 70.0 in | Wt 203.1 lb

## 2015-08-16 VITALS — BP 122/77 | HR 58 | Temp 97.7°F | Resp 16

## 2015-08-16 DIAGNOSIS — Z5112 Encounter for antineoplastic immunotherapy: Secondary | ICD-10-CM | POA: Diagnosis not present

## 2015-08-16 DIAGNOSIS — R972 Elevated prostate specific antigen [PSA]: Secondary | ICD-10-CM

## 2015-08-16 DIAGNOSIS — C8319 Mantle cell lymphoma, extranodal and solid organ sites: Secondary | ICD-10-CM

## 2015-08-16 DIAGNOSIS — D72819 Decreased white blood cell count, unspecified: Secondary | ICD-10-CM | POA: Diagnosis not present

## 2015-08-16 MED ORDER — DIPHENHYDRAMINE HCL 25 MG PO CAPS
ORAL_CAPSULE | ORAL | Status: AC
  Filled 2015-08-16: qty 1

## 2015-08-16 MED ORDER — SODIUM CHLORIDE 0.9 % IV SOLN
375.0000 mg/m2 | Freq: Once | INTRAVENOUS | Status: AC
  Administered 2015-08-16: 800 mg via INTRAVENOUS
  Filled 2015-08-16: qty 70

## 2015-08-16 MED ORDER — SODIUM CHLORIDE 0.9 % IV SOLN
Freq: Once | INTRAVENOUS | Status: AC
  Administered 2015-08-16: 10:00:00 via INTRAVENOUS

## 2015-08-16 MED ORDER — DIPHENHYDRAMINE HCL 25 MG PO CAPS
25.0000 mg | ORAL_CAPSULE | Freq: Once | ORAL | Status: AC
  Administered 2015-08-16: 25 mg via ORAL

## 2015-08-16 MED ORDER — SODIUM CHLORIDE 0.9 % IV SOLN: 375.0000 mg/m2 | Freq: Once | INTRAVENOUS | Status: DC

## 2015-08-16 MED ORDER — ACETAMINOPHEN 325 MG PO TABS
ORAL_TABLET | ORAL | Status: AC
  Filled 2015-08-16: qty 2

## 2015-08-16 MED ORDER — SODIUM CHLORIDE 0.9% FLUSH
10.0000 mL | INTRAVENOUS | Status: DC | PRN
  Administered 2015-08-16: 10 mL
  Filled 2015-08-16: qty 10

## 2015-08-16 MED ORDER — HEPARIN SOD (PORK) LOCK FLUSH 100 UNIT/ML IV SOLN
500.0000 [IU] | Freq: Once | INTRAVENOUS | Status: AC | PRN
  Administered 2015-08-16: 500 [IU]
  Filled 2015-08-16: qty 5

## 2015-08-16 MED ORDER — ACETAMINOPHEN 325 MG PO TABS
650.0000 mg | ORAL_TABLET | Freq: Once | ORAL | Status: AC
  Administered 2015-08-16: 650 mg via ORAL

## 2015-08-16 NOTE — Patient Instructions (Signed)
Rituximab injection What is this medicine? RITUXIMAB (ri TUX i mab) is a monoclonal antibody. It is used commonly to treat non-Hodgkin lymphoma and other conditions. It is also used to treat rheumatoid arthritis (RA). In RA, this medicine slows the inflammatory process and help reduce joint pain and swelling. This medicine is often used with other cancer or arthritis medications. This medicine may be used for other purposes; ask your health care provider or pharmacist if you have questions. What should I tell my health care provider before I take this medicine? They need to know if you have any of these conditions: -blood disorders -heart disease -history of hepatitis B -infection (especially a virus infection such as chickenpox, cold sores, or herpes) -irregular heartbeat -kidney disease -lung or breathing disease, like asthma -lupus -an unusual or allergic reaction to rituximab, mouse proteins, other medicines, foods, dyes, or preservatives -pregnant or trying to get pregnant -breast-feeding How should I use this medicine? This medicine is for infusion into a vein. It is administered in a hospital or clinic by a specially trained health care professional. A special MedGuide will be given to you by the pharmacist with each prescription and refill. Be sure to read this information carefully each time. Talk to your pediatrician regarding the use of this medicine in children. This medicine is not approved for use in children. Overdosage: If you think you have taken too much of this medicine contact a poison control center or emergency room at once. NOTE: This medicine is only for you. Do not share this medicine with others. What if I miss a dose? It is important not to miss a dose. Call your doctor or health care professional if you are unable to keep an appointment. What may interact with this medicine? -cisplatin -medicines for blood pressure -some other medicines for  arthritis -vaccines This list may not describe all possible interactions. Give your health care provider a list of all the medicines, herbs, non-prescription drugs, or dietary supplements you use. Also tell them if you smoke, drink alcohol, or use illegal drugs. Some items may interact with your medicine. What should I watch for while using this medicine? Report any side effects that you notice during your treatment right away, such as changes in your breathing, fever, chills, dizziness or lightheadedness. These effects are more common with the first dose. Visit your prescriber or health care professional for checks on your progress. You will need to have regular blood work. Report any other side effects. The side effects of this medicine can continue after you finish your treatment. Continue your course of treatment even though you feel ill unless your doctor tells you to stop. Call your doctor or health care professional for advice if you get a fever, chills or sore throat, or other symptoms of a cold or flu. Do not treat yourself. This drug decreases your body's ability to fight infections. Try to avoid being around people who are sick. This medicine may increase your risk to bruise or bleed. Call your doctor or health care professional if you notice any unusual bleeding. Be careful brushing and flossing your teeth or using a toothpick because you may get an infection or bleed more easily. If you have any dental work done, tell your dentist you are receiving this medicine. Avoid taking products that contain aspirin, acetaminophen, ibuprofen, naproxen, or ketoprofen unless instructed by your doctor. These medicines may hide a fever. Do not become pregnant while taking this medicine. Women should inform their doctor if   they wish to become pregnant or think they might be pregnant. There is a potential for serious side effects to an unborn child. Talk to your health care professional or pharmacist for more  information. Do not breast-feed an infant while taking this medicine. What side effects may I notice from receiving this medicine? Side effects that you should report to your doctor or health care professional as soon as possible: -allergic reactions like skin rash, itching or hives, swelling of the face, lips, or tongue -low blood counts - this medicine may decrease the number of white blood cells, red blood cells and platelets. You may be at increased risk for infections and bleeding. -signs of infection - fever or chills, cough, sore throat, pain or difficulty passing urine -signs of decreased platelets or bleeding - bruising, pinpoint red spots on the skin, black, tarry stools, blood in the urine -signs of decreased red blood cells - unusually weak or tired, fainting spells, lightheadedness -breathing problems -confused, not responsive -chest pain -fast, irregular heartbeat -feeling faint or lightheaded, falls -mouth sores -redness, blistering, peeling or loosening of the skin, including inside the mouth -stomach pain -swelling of the ankles, feet, or hands -trouble passing urine or change in the amount of urine Side effects that usually do not require medical attention (report to your doctor or other health care professional if they continue or are bothersome): -anxiety -headache -loss of appetite -muscle aches -nausea -night sweats This list may not describe all possible side effects. Call your doctor for medical advice about side effects. You may report side effects to FDA at 1-800-FDA-1088. Where should I keep my medicine? This drug is given in a hospital or clinic and will not be stored at home. NOTE: This sheet is a summary. It may not cover all possible information. If you have questions about this medicine, talk to your doctor, pharmacist, or health care provider.    2016, Elsevier/Gold Standard. (2014-03-29 22:30:56)  

## 2015-08-16 NOTE — Telephone Encounter (Signed)
appt made and avs printed °

## 2015-08-16 NOTE — Progress Notes (Signed)
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 He "blew this off" and it did not happen again until 01/17/2015 when he felt weak at work and later fainted at home. He was brought to the ED where he was found to be guaiac positive (denies melena before that date) with a Hb of 4.5 MCV 82.2, platelets 295K, WBC 9.1 and normal INR and PTT. He was admitted and underwent EGD under Dr Michail Sermon 01/18/2015 showing a very large duodenal ulcer w clot. Abdominal US 01/19/2015 was not very informative but CT abd/pelvis 12/17,2016 showed adenopathy in the anterior mediastinum, retroperitoneum and pelvis, with a large ulcerated mass involving the distal stomach/proximal duodenum and extending into the gastrohepatic region.   On 01/22/2015 he underwent right inguinal lymph node biopsy, consistent with mantle cell lymphoma (([id number] and [id number])). Specifically the lymph node showed effacement of the architecture by sheets of small to medium sized lymphocytes with no apparent nodularity. The cells were positive for CD20, CD5, BCL-2, CD21, and cyclin D1. He 65 was low (10-20%). CD21 revealed a few residual germinal centers area at the B cells were negative for CD10, and  BCL-6. CD23 was negative.  On 02/06/2015 the patient underwent a bone marrow aspirate and biopsy. The pathology from this procedure (FZB 17-2 and 17-7) was positive for involvement by non-Hodgkin's lymphoma with numerous atypical interstitial and paratrabecular lymphoid aggregates consistent with mantle cell lymphoma. Iron stain showed scant iron flow cytometry confirmed a monoclonal B-cell population, lambda restricted, consistent with the patient's known diagnosis of mantle cell lymphoma.  On 01/18/2015 the patient underwent EGD which found a massive duodenal bulb ulcer with adherent clot, although no active bleeding.his hemoglobin was in the 8.4 range and stable. He was discharged on 01/22/2015, but within 24 hours was readmitted with syncope and altered mental status. He had aspirated and required intubation for his respiratory failure. Hemoglobin was down to 6.3. An NG tube was placed, the patient was multiply transfused, and Surgery was considered but felt not to be feasible.accordingly the patient underwent arteriography 01/23/2015 which found the superior mesenteric and celiac arteries not to be the involved vessels. The gastroduodenal artery showed multiple branches providing supply to the hypervascular duodenal mass, and this artery was coil embolized.. Also a port was placed at the time of this procedure with a view to eventual chemotherapy.  He was discharged 01/26/2015,but required readmission 02/07/2015 for another presyncopal episode. He presented to the emergency room where he was found to have a hemoglobin of 7.5. The patient was transfusedIIA hemoglobin of 10 and was discharged 02/07/2014. He was scheduled to start CHOP right toxin 02/08/2014.  His subsequent history is as detailed below.  INTERVAL HISTORY: Will returns today for the start of right toxin maintenance. He has no "B" symptoms, and specifically no fevers, sweats, rash, unexplained weight loss  or unexplained fatigue. In fact  he just came back from a family pink neck where he "for almost 30 people. He is back to running and doing weights 4 or 5 times a week.  REVIEW OF SYSTEMS: Will ended up breathing some ashes he says when he did cooking on Father's Day and this even have a slight cough. That is waning. He doesn't have any pleurisy, shortness of breath or purulence. He has noted no urinary symptoms or change in his bladder habits. A detailed review of systems today was otherwise stable  PAST MEDICAL HISTORY: Past Medical History  Diagnosis Date  . Essential hypertension   . HLD (hyperlipidemia)   . GERD (gastroesophageal reflux disease)   . Anemia   . Cancer ([facility])     Lymphoma  . GI bleed   . Weakness     PAST SURGICAL HISTORY: Past Surgical History  Procedure Laterality Date  . Skin surgery      Small benign cysts over left scalp removed  . Esophagogastroduodenoscopy Left 01/18/2015    Procedure: ESOPHAGOGASTRODUODENOSCOPY (EGD);  Surgeon: Wilford Corner, MD;  Location: [facility] ENDOSCOPY;  Service: Endoscopy;  Laterality: Left;  . Inguinal hernia repair Right 01/22/2015    Procedure: RIGHT INGUINAL LYMPH NODE BX;  Surgeon: Donnie Mesa, MD;  Location: [place];  Service: General;  Laterality: Right;  . Portacath placement  01/26/2015     power port with tip SVC/RA Junction    FAMILY HISTORY Family History  Problem Relation Age of Onset  . Hypertension Mother   . Hypertension Father   . Hypertension Sister   . Diabetes Sister   . Prostate cancer Brother   . Lupus Sister   . Kidney failure Father   The patient's father died from CHF complications age 87. The patient's mother is 75 y/o as of December 2016. The patient has 9 brothers, 8 sisters. One brother has prostate cancer. There is no other cancer history in the family to his knowledge.  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 His wife of 11 years, [name], is disabled due to RA. Daughtr [name]  works as a Teacher, early years/pre for [employer] (she previously worked at the [employer] in records). Son [name] owns a window washing business. Son [name] is in Press photographer at [employer]. All live in [place]. The patient has 3 grandchildren. He attends a local [place] DIRECTIVES: Not in place   HEALTH MAINTENANCE: Social History  Substance Use Topics  . Smoking status: Never Smoker   . Smokeless tobacco: Never Used  . Alcohol Use: Yes     Comment: socially     Colonoscopy:  PSA: 2.27 on 01/22/2015  Hepatitis serologies (be surface antigen, hepatitis B and hepatitis see IgM) negative 01/18/2015  No Known Allergies  Current Outpatient Prescriptions  Medication Sig Dispense Refill  . lisinopril-hydrochlorothiazide (PRINZIDE,ZESTORETIC) 20-25 MG tablet Take 1 tablet by mouth daily. 30 tablet 1  . lovastatin (MEVACOR) 20 MG tablet Take 20 mg by mouth at bedtime.    . predniSONE (DELTASONE) 20 MG tablet Take 3 tablets a day ( in am ) with food x 5 days starting on day 1 of treatment plan 15 tablet 6   No current facility-administered medications for this visit.    OBJECTIVE: Middle-aged African-American man  Who appears well  Filed Vitals:   08/16/15 0819  BP: 142/87  Pulse: 65  Temp: 98 F (36.7 C)  Resp: 18     Body mass index is 29.14 kg/(m^2).    ECOG FS:0 - Asymptomatic  Sclerae unicteric, EOMs intact Oropharynx clear and moist No cervical or supraclavicular adenopathy Lungs no rales or rhonchi Heart regular rate and rhythm Abd soft, nontender, positive bowel sounds MSK no focal spinal tenderness, no upper extremity lymphedema Neuro: nonfocal, well oriented, appropriate affect Skin: He has a stable cyst in the upper right back  LAB RESULTS:  CMP     Component Value Date/Time   NA 139 08/08/2015 1533   NA 138 02/08/2015 0705   K 3.9 08/08/2015 1533   K 4.0 02/08/2015 0705   CL 102 02/08/2015 0705   CO2 26 08/08/2015 1533   CO2 27 02/08/2015  0705   GLUCOSE 84 08/08/2015 1533   GLUCOSE 115* 02/08/2015 0705   BUN 19.0 08/08/2015 1533   BUN 22* 02/08/2015 0705   CREATININE 1.0 08/08/2015 1533   CREATININE 1.11 02/08/2015 0705   CALCIUM 9.4 08/08/2015 1533   CALCIUM 8.9 02/08/2015 0705   PROT 7.2 08/08/2015 1533   PROT 5.4* 01/27/2015 0544   ALBUMIN 4.2 08/08/2015 1533   ALBUMIN 2.5* 01/27/2015 0544   AST 23 08/08/2015 1533   AST 24 01/27/2015 0544   ALT 30 08/08/2015 1533   ALT 23 01/27/2015 0544   ALKPHOS 52 08/08/2015 1533   ALKPHOS 55 01/27/2015 0544   BILITOT 0.32 08/08/2015 1533   BILITOT 0.6 01/27/2015 0544   GFRNONAA >60 02/08/2015 0705   GFRAA >60 02/08/2015 0705    INo results found for: SPEP, UPEP  Lab Results  Component Value Date   WBC 3.4* 08/08/2015   NEUTROABS 1.3* 08/08/2015   HGB 11.3* 08/08/2015   HCT 35.1* 08/08/2015   MCV 85.3 08/08/2015   PLT 403* 08/08/2015      Chemistry      Component Value Date/Time   NA 139 08/08/2015 1533   NA 138 02/08/2015 0705   K 3.9 08/08/2015 1533   K 4.0 02/08/2015 0705   CL 102 02/08/2015 0705   CO2 26 08/08/2015 1533   CO2 27 02/08/2015 0705   BUN 19.0 08/08/2015 1533   BUN 22* 02/08/2015 0705   CREATININE 1.0 08/08/2015 1533   CREATININE 1.11 02/08/2015 0705      Component Value Date/Time   CALCIUM 9.4 08/08/2015 1533   CALCIUM 8.9 02/08/2015 0705   ALKPHOS 52 08/08/2015 1533   ALKPHOS 55 01/27/2015 0544   AST 23 08/08/2015 1533   AST 24 01/27/2015 0544   ALT 30 08/08/2015 1533   ALT 23 01/27/2015 0544   BILITOT 0.32 08/08/2015 1533   BILITOT 0.6 01/27/2015 0544       No results found for: LABCA2  No components found for: LABCA125  No results for input(s): INR in the last 168 hours.  Urinalysis    Component Value Date/Time   COLORURINE YELLOW 01/23/2015 0805   APPEARANCEUR CLEAR 01/23/2015 0805   LABSPEC 1.014 01/23/2015 0805   PHURINE 7.5 01/23/2015 0805   GLUCOSEU NEGATIVE 01/23/2015 0805   HGBUR NEGATIVE 01/23/2015 0805     BILIRUBINUR NEGATIVE 01/23/2015 0805   KETONESUR NEGATIVE 01/23/2015 0805   PROTEINUR NEGATIVE 01/23/2015 0805   NITRITE NEGATIVE 01/23/2015 0805   LEUKOCYTESUR NEGATIVE 01/23/2015 0805   Prostate Specific Ag, Serum 0.0 - 4.0 ng/mL 4.2 (H) 2.27R, CM         Ref Range 8d ago (08/08/15) 61moago (07/05/15) 273mogo (06/14/15) 37m73moo (05/24/15)    LDH  125 - 245 U/L 261 (H) 328 (H) 316 (H) 271 (H)      Results for DOVBER, ERNEST (MRN [id number]) as of 08/16/2015 08:21  Ref. Range 08/08/2015 15:33  Beta 2 Latest Ref Range: 0.6-2.4 mg/L 1.6     STUDIES: No results found.  ASSESSMENT: 65 y.o. 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 presenting with syncope secondary to bleeding from a large ulcerated duodenal ulcer  (a) s/p coil embolization of the feeding (gastro-duodenal) artery 01/23/2015  (b) anemia--scant iron stores on bone marrow biopsy--s/p feraheme 02/13/2015  (1) right inguinal lymph node biopsy 01/22/2015 confirms mantle cell lymphoma  (a) bone marrow biopsy 02/06/2015 positive for involvement by the patient's mantle cell lymphoma  (b) IPI score of 5 (high risk) predicts a 5 year progression free survival of 50% with CHOP-Rituxan chemotherapy  (c) MIPI score of 5 (intermediate risk) predicts a median survival of 58 months  (2) CHOP/Rituxan started 02/09/2015, completed 8 cycles 07/05/2015  (3)  [facility] consultation 04/06/2015. Patient opted against transplant consolidation  (4) rituximab maintenance started 08/16/2015, repeat every 2 months  (5) PSA increased noted July 2017, repeat pending in October  PLAN: Will is ready to start his right toxin maintenance today. We discussed the fact that his white cell count is slightly low. We are proceeding with treatment regardless. If there is a significant drop eventually we will consider switching to ibrutinib.  His PSA has almost doubled. Of course this could be a  sign of prostate cancer but it also more likely in my opinion is a sign of prostatitis. He has no symptoms regarding this but of course of prostatitis is frequently asymptomatic. We are going to repeat this in 3 months and if the PSA still elevated I will refer him to urology.  He will see me again in 2 months with his next right toxin dose. She knows to call for any problems that may develop before the next visit here.    Chauncey Cruel, MD. 08/16/2015

## 2015-09-12 ENCOUNTER — Other Ambulatory Visit: Payer: Self-pay | Admitting: Nurse Practitioner

## 2015-10-11 ENCOUNTER — Ambulatory Visit (HOSPITAL_BASED_OUTPATIENT_CLINIC_OR_DEPARTMENT_OTHER): Payer: BLUE CROSS/BLUE SHIELD | Admitting: Oncology

## 2015-10-11 ENCOUNTER — Ambulatory Visit: Payer: BLUE CROSS/BLUE SHIELD

## 2015-10-11 ENCOUNTER — Other Ambulatory Visit (HOSPITAL_BASED_OUTPATIENT_CLINIC_OR_DEPARTMENT_OTHER): Payer: BLUE CROSS/BLUE SHIELD

## 2015-10-11 ENCOUNTER — Telehealth: Payer: Self-pay | Admitting: Oncology

## 2015-10-11 VITALS — BP 121/71 | HR 68 | Temp 97.8°F | Resp 18 | Ht 70.0 in | Wt 203.4 lb

## 2015-10-11 DIAGNOSIS — C8315 Mantle cell lymphoma, lymph nodes of inguinal region and lower limb: Secondary | ICD-10-CM | POA: Diagnosis not present

## 2015-10-11 DIAGNOSIS — C8319 Mantle cell lymphoma, extranodal and solid organ sites: Secondary | ICD-10-CM

## 2015-10-11 DIAGNOSIS — K922 Gastrointestinal hemorrhage, unspecified: Secondary | ICD-10-CM

## 2015-10-11 LAB — COMPREHENSIVE METABOLIC PANEL
ALBUMIN: 4.1 g/dL (ref 3.5–5.0)
ALT: 39 U/L (ref 0–55)
AST: 25 U/L (ref 5–34)
Alkaline Phosphatase: 47 U/L (ref 40–150)
Anion Gap: 11 mEq/L (ref 3–11)
BUN: 19.5 mg/dL (ref 7.0–26.0)
CHLORIDE: 102 meq/L (ref 98–109)
CO2: 27 meq/L (ref 22–29)
Calcium: 9.3 mg/dL (ref 8.4–10.4)
Creatinine: 1.1 mg/dL (ref 0.7–1.3)
EGFR: 86 mL/min/{1.73_m2} — AB (ref >90)
GLUCOSE: 100 mg/dL (ref 70–140)
POTASSIUM: 4.1 meq/L (ref 3.5–5.1)
SODIUM: 140 meq/L (ref 136–145)
Total Bilirubin: 0.39 mg/dL (ref 0.20–1.20)
Total Protein: 7.3 g/dL (ref 6.4–8.3)

## 2015-10-11 LAB — CBC WITH DIFFERENTIAL/PLATELET
BASO%: 0.4 % (ref 0.0–2.0)
BASOS ABS: 0 10*3/uL (ref 0.0–0.1)
EOS%: 3 % (ref 0.0–7.0)
Eosinophils Absolute: 0.1 10*3/uL (ref 0.0–0.5)
HCT: 37.7 % — ABNORMAL LOW (ref 38.4–49.9)
HEMOGLOBIN: 12.1 g/dL — AB (ref 13.0–17.1)
LYMPH%: 35.4 % (ref 14.0–49.0)
MCH: 27.1 pg — AB (ref 27.2–33.4)
MCHC: 32 g/dL (ref 32.0–36.0)
MCV: 84.7 fL (ref 79.3–98.0)
MONO#: 0.5 10*3/uL (ref 0.1–0.9)
MONO%: 23.8 % — AB (ref 0.0–14.0)
NEUT#: 0.8 10*3/uL — ABNORMAL LOW (ref 1.5–6.5)
NEUT%: 37.4 % — ABNORMAL LOW (ref 39.0–75.0)
Platelets: 308 10*3/uL (ref 140–400)
RBC: 4.45 10*6/uL (ref 4.20–5.82)
RDW: 14.2 % (ref 11.0–14.6)
WBC: 2.3 10*3/uL — ABNORMAL LOW (ref 4.0–10.3)
lymph#: 0.8 10*3/uL — ABNORMAL LOW (ref 0.9–3.3)

## 2015-10-11 LAB — LACTATE DEHYDROGENASE: LDH: 251 U/L — ABNORMAL HIGH (ref 125–245)

## 2015-10-11 NOTE — Progress Notes (Signed)
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 He "blew this off" and it did not happen again until 01/17/2015 when he felt weak at work and later fainted at home. He was brought to the ED where he was found to be guaiac positive (denies melena before that date) with a Hb of 4.5 MCV 82.2, platelets 295K, WBC 9.1 and normal INR and PTT. He was admitted and underwent EGD under Dr Michail Sermon 01/18/2015 showing a very large duodenal ulcer w clot. Abdominal US 01/19/2015 was not very informative but CT abd/pelvis 12/17,2016 showed adenopathy in the anterior mediastinum, retroperitoneum and pelvis, with a large ulcerated mass involving the distal stomach/proximal duodenum and extending into the gastrohepatic region.   On 01/22/2015 he underwent right inguinal lymph node biopsy, consistent with mantle cell lymphoma (([id number] and [id number])). Specifically the lymph node showed effacement of the architecture by sheets of small to medium sized lymphocytes with no apparent nodularity. The cells were positive for CD20, CD5, BCL-2, CD21, and cyclin D1. He 63 was low (10-20%). CD21 revealed a few residual germinal centers area at the B cells  were negative for CD10, and BCL-6. CD23 was negative.  On 02/06/2015 the patient underwent a bone marrow aspirate and biopsy. The pathology from this procedure (FZB 17-2 and 17-7) was positive for involvement by non-Hodgkin's lymphoma with numerous atypical interstitial and paratrabecular lymphoid aggregates consistent with mantle cell lymphoma. Iron stain showed scant iron flow cytometry confirmed a monoclonal B-cell population, lambda restricted, consistent with the patient's known diagnosis of mantle cell lymphoma.  On 01/18/2015 the patient underwent EGD which found a massive duodenal bulb ulcer with adherent clot, although no active bleeding.his hemoglobin was in the 8.4 range and stable. He was discharged on 01/22/2015, but within 24 hours was readmitted with syncope and altered mental status. He had aspirated and required intubation for his respiratory failure. Hemoglobin was down to 6.3. An NG tube was placed, the patient was multiply transfused, and Surgery was considered but felt not to be feasible.accordingly the patient underwent arteriography 01/23/2015 which found the superior mesenteric and celiac arteries not to be the involved vessels. The gastroduodenal artery showed multiple branches providing supply to the hypervascular duodenal mass, and this artery was coil embolized.. Also a port was placed at the time of this procedure with a view to eventual chemotherapy.  He was discharged 01/26/2015,but required readmission 02/07/2015 for another presyncopal episode. He presented to the emergency room where he was found to have a hemoglobin of 7.5. The patient was transfusedIIA hemoglobin of 10 and was discharged 02/07/2014. He was scheduled to start CHOP right toxin 02/08/2014.  His subsequent history is as detailed below.  INTERVAL HISTORY: Will returns today for continuing follow-up of his mantle cell lymphoma. He has been receiving rituximab every 2 months and  a dose is due today.  REVIEW  OF SYSTEMS: Will is working full time, working out at [employer], and enjoying his family. He has had no fevers, no rash, no adenopathy, no unexplained fatigue and no weight loss. He is "the picture of health" by his own account. A detailed review of systems today was entirely benign.  PAST MEDICAL HISTORY: Past Medical History:  Diagnosis Date  . Anemia   . Cancer ([facility])    Lymphoma  . Essential hypertension   . GERD (gastroesophageal reflux disease)   . GI bleed   . HLD (hyperlipidemia)   . Weakness     PAST SURGICAL HISTORY: Past Surgical History:  Procedure Laterality Date  . ESOPHAGOGASTRODUODENOSCOPY Left 01/18/2015   Procedure: ESOPHAGOGASTRODUODENOSCOPY (EGD);  Surgeon: Charlott Rakes, MD;  Location: [facility] ENDOSCOPY;  Service: Endoscopy;  Laterality: Left;  . INGUINAL HERNIA REPAIR Right 01/22/2015   Procedure: RIGHT INGUINAL LYMPH NODE BX;  Surgeon: Manus Rudd, MD;  Location: [facility] OR;  Service: General;  Laterality: Right;  . PORTACATH PLACEMENT  01/26/2015    power port with tip SVC/RA Junction  . SKIN SURGERY     Small benign cysts over left scalp removed    FAMILY HISTORY Family History  Problem Relation Age of Onset  . Hypertension Mother   . Hypertension Father   . Hypertension Sister   . Diabetes Sister   . Prostate cancer Brother   . Lupus Sister   . Kidney failure Father   The patient's father died from CHF complications age 63. The patient's mother is 90 y/o as of December 2016. The patient has 9 brothers, 8 sisters. One brother has prostate cancer. There is no other cancer history in the family to his knowledge.  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 His wife of 42 years, [name], is disabled due to RA. Daughtr [name] works as a Nurse, children's for [employer] (she previously worked at the [employer] in records). Son [name] owns a window washing business. Son [name] is in Airline pilot at [employer]. All live in [place]. The  patient has 3 grandchildren. He attends a [employer]    ADVANCED DIRECTIVES: Not in place   HEALTH MAINTENANCE: Social History  Substance Use Topics  . Smoking status: Never Smoker  . Smokeless tobacco: Never Used  . Alcohol use Yes     Comment: socially     Colonoscopy:  PSA: 2.27 on 01/22/2015 When the patient Dr. Hepatitis serologies (be surface antigen, hepatitis B and hepatitis see IgM) negative 01/18/2015  No Known Allergies  Current Outpatient Prescriptions  Medication Sig Dispense Refill  . lisinopril-hydrochlorothiazide (PRINZIDE,ZESTORETIC) 20-25 MG tablet Take 1 tablet by mouth daily. 30 tablet 1  . lovastatin (MEVACOR) 20 MG tablet Take 20 mg by mouth at bedtime.     No current facility-administered medications for this visit.     OBJECTIVE: Middle-aged African-American man In no acute distress Vitals:   10/11/15 0940  BP: 121/71  Pulse: 68  Resp: 18  Temp: 97.8 F (36.6 C)     Body mass index is 29.18 kg/m.    ECOG FS:0 - Asymptomatic  Sclerae unicteric, pupils round and equal Oropharynx clear and moist-- no thrush or other lesions No cervical or supraclavicular adenopathy Lungs no rales or rhonchi Heart regular rate and rhythm Abd soft, nontender, positive bowel sounds MSK no focal spinal tenderness, no upper extremity lymphedema Neuro: nonfocal, well oriented, appropriate  affect Skin: Cyst on back unchanged    LAB RESULTS:  CMP     Component Value Date/Time   NA 139 08/08/2015 1533   K 3.9 08/08/2015 1533   CL 102 02/08/2015 0705   CO2 26 08/08/2015 1533   GLUCOSE 84 08/08/2015 1533   BUN 19.0 08/08/2015 1533   CREATININE 1.0 08/08/2015 1533   CALCIUM 9.4 08/08/2015 1533   PROT 7.2 08/08/2015 1533   ALBUMIN 4.2 08/08/2015 1533   AST 23 08/08/2015 1533   ALT 30 08/08/2015 1533   ALKPHOS 52 08/08/2015 1533   BILITOT 0.32 08/08/2015 1533   GFRNONAA >60 02/08/2015 0705   GFRAA >60 02/08/2015 0705    INo  results found for: SPEP, UPEP  Lab Results  Component Value Date   WBC 2.3 (L) 10/11/2015   NEUTROABS 0.8 (L) 10/11/2015   HGB 12.1 (L) 10/11/2015   HCT 37.7 (L) 10/11/2015   MCV 84.7 10/11/2015   PLT 308 10/11/2015      Chemistry      Component Value Date/Time   NA 139 08/08/2015 1533   K 3.9 08/08/2015 1533   CL 102 02/08/2015 0705   CO2 26 08/08/2015 1533   BUN 19.0 08/08/2015 1533   CREATININE 1.0 08/08/2015 1533      Component Value Date/Time   CALCIUM 9.4 08/08/2015 1533   ALKPHOS 52 08/08/2015 1533   AST 23 08/08/2015 1533   ALT 30 08/08/2015 1533   BILITOT 0.32 08/08/2015 1533       No results found for: LABCA2  No components found for: LABCA125  No results for input(s): INR in the last 168 hours.  Urinalysis    Component Value Date/Time   COLORURINE YELLOW 01/23/2015 0805   APPEARANCEUR CLEAR 01/23/2015 0805   LABSPEC 1.014 01/23/2015 0805   PHURINE 7.5 01/23/2015 0805   GLUCOSEU NEGATIVE 01/23/2015 0805   HGBUR NEGATIVE 01/23/2015 0805   BILIRUBINUR NEGATIVE 01/23/2015 0805   KETONESUR NEGATIVE 01/23/2015 0805   PROTEINUR NEGATIVE 01/23/2015 0805   NITRITE NEGATIVE 01/23/2015 0805   LEUKOCYTESUR NEGATIVE 01/23/2015 0805   Prostate Specific Ag, Serum 0.0 - 4.0 ng/mL 4.2 (H) 2.27R, CM         Ref Range 8d ago (08/08/15) 19moago (07/05/15) 275mogo (06/14/15) 63m463moo (05/24/15)    LDH 125 - 245 U/L 261 (H) 328 (H) 316 (H) 271 (H)      Results for EBRXAIVIER, MALAYRN [id number] of 08/16/2015 08:21  Ref. Range 08/08/2015 15:33  Beta 2 Latest Ref Range: 0.6-2.4 mg/L 1.6     STUDIES: No results found.  ASSESSMENT: [age]o. 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 presenting with syncope secondary to bleeding from a large ulcerated duodenal ulcer  (a) s/p coil embolization of the feeding (gastro-duodenal) artery 01/23/2015  (b) anemia--scant iron stores on bone marrow biopsy--s/p  feraheme 02/13/2015  (1) right inguinal lymph node biopsy 01/22/2015 confirms mantle cell lymphoma  (a) bone marrow biopsy 02/06/2015 positive for involvement by the patient's mantle cell lymphoma  (b) IPI score of 5 (high risk) predicts a 5 year progression free survival of 50% with CHOP-Rituxan chemotherapy  (c) MIPI score of 5 (intermediate risk) predicts a median survival of 58 months  (2) CHOP/Rituxan started 02/09/2015, completed 8 cycles 07/05/2015  (3)  [facility] consultation 04/06/2015. Patient opted against transplant consolidation  (4) rituximab maintenance started 08/16/2015, repeat every 2 months  (5) PSA increased noted July  2017, repeat pending in October  PLAN: Will looks terrific and clinically there is no evidence of disease activity. However his [place] has dropped below 1000. This is almost certainly going to be due to the right rituximab. Accordingly we are holding that today.  Today's PSA is pending. I will call him with those results and if it remains elevated I will refer him to urology for further evaluation. He is agreeable to this plan.  He is going to see me again in 2 months. We will see if we can resume right rituximab at that time. If not we will consider starting ibrutinib. Before that visit he will have a repeat PET scan.  Will understands his counts are now low and he needs to take measures to avoid infection. The most important thing of course is hand washing. He will also avoid anyone who is ill. Otherwise he continues to have 100% functional status.  He will call with any problems that may develop before the next visit.    Chauncey Cruel, MD. 10/11/15

## 2015-10-11 NOTE — Telephone Encounter (Signed)
appt made and avs printed °

## 2015-10-12 ENCOUNTER — Encounter: Payer: Self-pay | Admitting: Oncology

## 2015-10-12 LAB — BETA 2 MICROGLOBULIN, SERUM: Beta-2: 1.5 mg/L (ref 0.6–2.4)

## 2015-10-12 LAB — PSA: Prostate Specific Ag, Serum: 3.2 ng/mL (ref 0.0–4.0)

## 2015-10-16 ENCOUNTER — Telehealth: Payer: Self-pay

## 2015-10-16 MED ORDER — LISINOPRIL-HYDROCHLOROTHIAZIDE 20-25 MG PO TABS: 1.0000 | ORAL_TABLET | Freq: Every day | ORAL | 1 refills | Status: DC

## 2015-10-16 NOTE — Telephone Encounter (Signed)
Pt called requesting lisinopril refill. Done

## 2015-11-21 ENCOUNTER — Telehealth: Payer: Self-pay | Admitting: *Deleted

## 2015-11-21 DIAGNOSIS — E785 Hyperlipidemia, unspecified: Secondary | ICD-10-CM

## 2015-11-21 DIAGNOSIS — F40240 Claustrophobia: Secondary | ICD-10-CM

## 2015-11-21 MED ORDER — DIAZEPAM 5 MG PO TABS: ORAL_TABLET | ORAL | 0 refills | Status: DC

## 2015-11-21 MED ORDER — LOVASTATIN 20 MG PO TABS
20.0000 mg | ORAL_TABLET | Freq: Every day | ORAL | 2 refills | Status: DC
Start: 2015-11-21 — End: 2016-01-21

## 2015-11-21 NOTE — Telephone Encounter (Signed)
S/w pt that lovastatin is reordered. Diazepam was ordered for claustrophobia with PET scan, instructions given.

## 2015-11-21 NOTE — Telephone Encounter (Signed)
"  I need refills before I have my Pet Scan on 11-29-2015.  I need Mevacor. I'm very claustrophobic and do not think I can have the scan. Whatever you all prescribe for claustrophobia, I need it."  Advised the Mevacor is for cholesterol.  Call transferred ext [phone].

## 2015-11-26 ENCOUNTER — Other Ambulatory Visit: Payer: Self-pay | Admitting: *Deleted

## 2015-11-26 ENCOUNTER — Other Ambulatory Visit (HOSPITAL_BASED_OUTPATIENT_CLINIC_OR_DEPARTMENT_OTHER): Payer: BLUE CROSS/BLUE SHIELD

## 2015-11-26 DIAGNOSIS — C8319 Mantle cell lymphoma, extranodal and solid organ sites: Secondary | ICD-10-CM

## 2015-11-26 DIAGNOSIS — C8315 Mantle cell lymphoma, lymph nodes of inguinal region and lower limb: Secondary | ICD-10-CM

## 2015-11-26 DIAGNOSIS — K922 Gastrointestinal hemorrhage, unspecified: Secondary | ICD-10-CM

## 2015-11-26 LAB — CBC WITH DIFFERENTIAL/PLATELET
BASO%: 0.4 % (ref 0.0–2.0)
BASOS ABS: 0 10*3/uL (ref 0.0–0.1)
EOS%: 3.1 % (ref 0.0–7.0)
Eosinophils Absolute: 0.1 10*3/uL (ref 0.0–0.5)
HCT: 37.9 % — ABNORMAL LOW (ref 38.4–49.9)
HGB: 12.3 g/dL — ABNORMAL LOW (ref 13.0–17.1)
LYMPH%: 29.1 % (ref 14.0–49.0)
MCH: 27.4 pg (ref 27.2–33.4)
MCHC: 32.4 g/dL (ref 32.0–36.0)
MCV: 84.4 fL (ref 79.3–98.0)
MONO#: 0.4 10*3/uL (ref 0.1–0.9)
MONO%: 12 % (ref 0.0–14.0)
NEUT#: 1.9 10*3/uL (ref 1.5–6.5)
NEUT%: 55.4 % (ref 39.0–75.0)
Platelets: 320 10*3/uL (ref 140–400)
RBC: 4.49 10*6/uL (ref 4.20–5.82)
RDW: 14.2 % (ref 11.0–14.6)
WBC: 3.5 10*3/uL — ABNORMAL LOW (ref 4.0–10.3)
lymph#: 1 10*3/uL (ref 0.9–3.3)

## 2015-11-26 LAB — COMPREHENSIVE METABOLIC PANEL
ALBUMIN: 3.9 g/dL (ref 3.5–5.0)
ALK PHOS: 45 U/L (ref 40–150)
ALT: 29 U/L (ref 0–55)
AST: 23 U/L (ref 5–34)
Anion Gap: 11 mEq/L (ref 3–11)
BILIRUBIN TOTAL: 0.39 mg/dL (ref 0.20–1.20)
BUN: 16.6 mg/dL (ref 7.0–26.0)
CO2: 25 mEq/L (ref 22–29)
Calcium: 9.1 mg/dL (ref 8.4–10.4)
Chloride: 103 mEq/L (ref 98–109)
Creatinine: 1.1 mg/dL (ref 0.7–1.3)
EGFR: 87 mL/min/{1.73_m2} — AB (ref >90)
GLUCOSE: 93 mg/dL (ref 70–140)
Potassium: 3.7 mEq/L (ref 3.5–5.1)
SODIUM: 139 meq/L (ref 136–145)
TOTAL PROTEIN: 7.1 g/dL (ref 6.4–8.3)

## 2015-11-26 LAB — LACTATE DEHYDROGENASE: LDH: 239 U/L (ref 125–245)

## 2015-11-27 LAB — BETA 2 MICROGLOBULIN, SERUM: BETA 2: 1.4 mg/L (ref 0.6–2.4)

## 2015-11-27 LAB — PSA: PROSTATE SPECIFIC AG, SERUM: 2.8 ng/mL (ref 0.0–4.0)

## 2015-11-29 ENCOUNTER — Encounter (HOSPITAL_COMMUNITY)
Admission: RE | Admit: 2015-11-29 | Discharge: 2015-11-29 | Disposition: A | Discharge status: Home / Self Care | Payer: BLUE CROSS/BLUE SHIELD | Source: Ambulatory Visit | Attending: Oncology | Admitting: Oncology

## 2015-11-29 DIAGNOSIS — C8319 Mantle cell lymphoma, extranodal and solid organ sites: Secondary | ICD-10-CM | POA: Diagnosis present

## 2015-11-29 LAB — GLUCOSE, CAPILLARY: GLUCOSE-CAPILLARY: 110 mg/dL — AB (ref 65–99)

## 2015-11-29 IMAGING — CT NM PET TUM IMG RESTAG (PS) SKULL BASE T - THIGH
1 of 6 series · 1 of 25 positions shown · non-contrast
Comparison: [DATE]

CLINICAL DATA: Subsequent treatment strategy for mantle cell
lymphoma.

EXAM:
NUCLEAR MEDICINE PET SKULL BASE TO THIGH
TECHNIQUE: 9.95 mCi F-18 FDG was injected intravenously. Full-ring PET imaging
was performed from the skull base to thigh after the radiotracer. CT
data was obtained and used for attenuation correction and anatomic
localization.
FASTING BLOOD GLUCOSE:  Value: 110 mg/dl

[Series 4: ct sk_thigh 5.0 b31f · axial · 5.0mm · 0.98mm/px · 1 of 218 slices shown]
[im 218/218  brain]
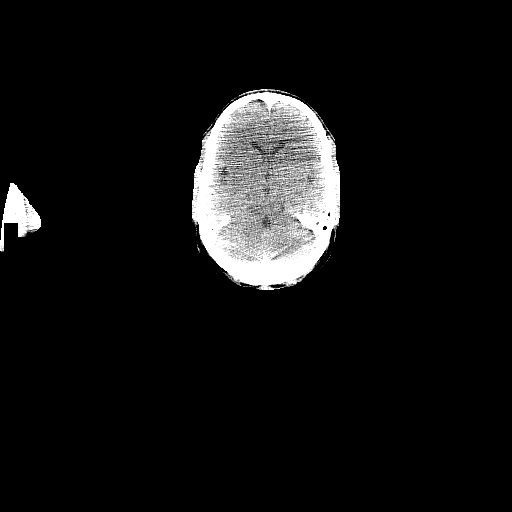

[1 of 25 positions shown; findings below may reference images not displayed]

FINDINGS: NECK

No hypermetabolic lymph nodes in the neck.

CHEST

No hypermetabolic mediastinal or hilar nodes. Right
juxtadiaphragmatic nodes measuring up to 10 mm short axis (series
4/image 82), previously 13 mm, non FDG avid.

No suspicious pulmonary nodules on the CT scan.

Right chest port terminating at the cavoatrial junction.

Mild coronary atherosclerosis in the LAD and right coronary artery.

ABDOMEN/PELVIS

No abnormal hypermetabolic activity within the liver, pancreas,
adrenal glands, or spleen.

Status post cholecystectomy. Mild atherosclerotic calcifications the
abdominal aorta and branch vessels.

No hypermetabolic lymph nodes in the abdomen or pelvis.

SKELETON

No focal hypermetabolic activity to suggest skeletal metastasis.
IMPRESSION: No evidence of active lymphoma.

Small right juxtadiaphragmatic nodes measuring up to 10 mm short
axis, mildly decreased, non FDG avid.

## 2015-11-29 MED ORDER — FLUDEOXYGLUCOSE F - 18 (FDG) INJECTION
9.9500 | Freq: Once | INTRAVENOUS | Status: AC | PRN
  Administered 2015-11-29: 9.95 via INTRAVENOUS

## 2015-12-05 ENCOUNTER — Encounter: Payer: Self-pay | Admitting: Pharmacist

## 2015-12-06 ENCOUNTER — Ambulatory Visit (HOSPITAL_BASED_OUTPATIENT_CLINIC_OR_DEPARTMENT_OTHER): Payer: BLUE CROSS/BLUE SHIELD

## 2015-12-06 ENCOUNTER — Ambulatory Visit (HOSPITAL_BASED_OUTPATIENT_CLINIC_OR_DEPARTMENT_OTHER): Payer: BLUE CROSS/BLUE SHIELD | Admitting: Oncology

## 2015-12-06 ENCOUNTER — Other Ambulatory Visit: Payer: BLUE CROSS/BLUE SHIELD

## 2015-12-06 VITALS — BP 131/74 | HR 61 | Temp 97.9°F | Resp 18 | Ht 70.0 in | Wt 205.2 lb

## 2015-12-06 VITALS — BP 107/66 | HR 60 | Temp 98.0°F | Resp 16

## 2015-12-06 DIAGNOSIS — C8319 Mantle cell lymphoma, extranodal and solid organ sites: Secondary | ICD-10-CM

## 2015-12-06 DIAGNOSIS — C8315 Mantle cell lymphoma, lymph nodes of inguinal region and lower limb: Secondary | ICD-10-CM

## 2015-12-06 DIAGNOSIS — Z5112 Encounter for antineoplastic immunotherapy: Secondary | ICD-10-CM

## 2015-12-06 MED ORDER — ACETAMINOPHEN 325 MG PO TABS
ORAL_TABLET | ORAL | Status: AC
  Filled 2015-12-06: qty 2

## 2015-12-06 MED ORDER — SILDENAFIL CITRATE 50 MG PO TABS: 50.0000 mg | ORAL_TABLET | Freq: Every day | ORAL | 0 refills | Status: DC | PRN

## 2015-12-06 MED ORDER — SODIUM CHLORIDE 0.9% FLUSH
10.0000 mL | INTRAVENOUS | Status: DC | PRN
  Administered 2015-12-06: 10 mL
  Filled 2015-12-06: qty 10

## 2015-12-06 MED ORDER — ACETAMINOPHEN 325 MG PO TABS
650.0000 mg | ORAL_TABLET | Freq: Once | ORAL | Status: AC
  Administered 2015-12-06: 650 mg via ORAL

## 2015-12-06 MED ORDER — DIPHENHYDRAMINE HCL 25 MG PO CAPS
25.0000 mg | ORAL_CAPSULE | Freq: Once | ORAL | Status: AC
  Administered 2015-12-06: 25 mg via ORAL

## 2015-12-06 MED ORDER — HEPARIN SOD (PORK) LOCK FLUSH 100 UNIT/ML IV SOLN
500.0000 [IU] | Freq: Once | INTRAVENOUS | Status: AC | PRN
  Administered 2015-12-06: 500 [IU]
  Filled 2015-12-06: qty 5

## 2015-12-06 MED ORDER — SODIUM CHLORIDE 0.9 % IV SOLN
375.0000 mg/m2 | Freq: Once | INTRAVENOUS | Status: AC
  Administered 2015-12-06: 800 mg via INTRAVENOUS
  Filled 2015-12-06: qty 50

## 2015-12-06 MED ORDER — DIPHENHYDRAMINE HCL 25 MG PO CAPS
ORAL_CAPSULE | ORAL | Status: AC
  Filled 2015-12-06: qty 1

## 2015-12-06 MED ORDER — SODIUM CHLORIDE 0.9 % IV SOLN
Freq: Once | INTRAVENOUS | Status: AC
  Administered 2015-12-06: 13:00:00 via INTRAVENOUS

## 2015-12-06 NOTE — Progress Notes (Signed)
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 He "blew this off" and it did not happen again until 01/17/2015 when he felt weak at work and later fainted at home. He was brought to the ED where he was found to be guaiac positive (denies melena before that date) with a Hb of 4.5 MCV 82.2, platelets 295K, WBC 9.1 and normal INR and PTT. He was admitted and underwent EGD under Dr Michail Sermon 01/18/2015 showing a very large duodenal ulcer w clot. Abdominal US 01/19/2015 was not very informative but CT abd/pelvis 12/17,2016 showed adenopathy in the anterior mediastinum, retroperitoneum and pelvis, with a large ulcerated mass involving the distal stomach/proximal duodenum and extending into the gastrohepatic region.   On 01/22/2015 he underwent right inguinal lymph node biopsy, consistent with mantle cell lymphoma (([id number] and [id number])). Specifically the lymph node showed effacement of the architecture by sheets of small to medium sized lymphocytes with no apparent nodularity. The cells were positive for CD20, CD5, BCL-2, CD21, and cyclin D1. He 67 was low (10-20%). CD21 revealed a few residual germinal centers area at the B cells were  negative for CD10, and BCL-6. CD23 was negative.  On 02/06/2015 the patient underwent a bone marrow aspirate and biopsy. The pathology from this procedure (FZB 17-2 and 17-7) was positive for involvement by non-Hodgkin's lymphoma with numerous atypical interstitial and paratrabecular lymphoid aggregates consistent with mantle cell lymphoma. Iron stain showed scant iron flow cytometry confirmed a monoclonal B-cell population, lambda restricted, consistent with the patient's known diagnosis of mantle cell lymphoma.  On 01/18/2015 the patient underwent EGD which found a massive duodenal bulb ulcer with adherent clot, although no active bleeding.his hemoglobin was in the 8.4 range and stable. He was discharged on 01/22/2015, but within 24 hours was readmitted with syncope and altered mental status. He had aspirated and required intubation for his respiratory failure. Hemoglobin was down to 6.3. An NG tube was placed, the patient was multiply transfused, and Surgery was considered but felt not to be feasible.accordingly the patient underwent arteriography 01/23/2015 which found the superior mesenteric and celiac arteries not to be the involved vessels. The gastroduodenal artery showed multiple branches providing supply to the hypervascular duodenal mass, and this artery was coil embolized.. Also a port was placed at the time of this procedure with a view to eventual chemotherapy.  He was discharged 01/26/2015,but required readmission 02/07/2015 for another presyncopal episode. He presented to the emergency room where he was found to have a hemoglobin of 7.5. The patient was transfusedIIA hemoglobin of 10 and was discharged 02/07/2014. He was scheduled to start CHOP right toxin 02/08/2014.  His subsequent history is as detailed below.  INTERVAL HISTORY: [name] returns today for follow-up of his mantle cell lymphoma. He completed his chemotherapy in June, and we started maintenance right toxin.  However he was not  able to receive the second dose because of cytopenias. He is here today to resume that treatment now that his counts have recovered.  Before this visit we obtained a restaging PET scan. It shows him to be in continuing complete remission  REVIEW OF SYSTEMS: [name] is doing "great". He works full-time, works out every day, and in [employer] granddaughter, now 93 and [age] years old, who dressed up as a butterfly for [employer]. He has had no fevers, sweats, unexplained fatigue or weight loss, rash, pruritus, or adenopathy. Bowel movements are entirely normal. He has noted that his nature is down. He like something to help that out if possible. Aside from that a detailed review of systems today was stable  PAST MEDICAL HISTORY: Past Medical History:  Diagnosis Date  . Anemia   . Cancer ([place])    Lymphoma  . Essential hypertension   . GERD (gastroesophageal reflux disease)   . GI bleed   . HLD (hyperlipidemia)   . Weakness     PAST SURGICAL HISTORY: Past Surgical History:  Procedure Laterality Date  . ESOPHAGOGASTRODUODENOSCOPY Left 01/18/2015   Procedure: ESOPHAGOGASTRODUODENOSCOPY (EGD);  Surgeon: Wilford Corner, MD;  Location: [facility];  Service: Endoscopy;  Laterality: Left;  . INGUINAL HERNIA REPAIR Right 01/22/2015   Procedure: RIGHT INGUINAL LYMPH NODE BX;  Surgeon: Donnie Mesa, MD;  Location: [facility];  Service: General;  Laterality: Right;  . PORTACATH PLACEMENT  01/26/2015    power port with tip SVC/RA Junction  . SKIN SURGERY     Small benign cysts over left scalp removed    FAMILY HISTORY Family History  Problem Relation Age of Onset  . Hypertension Mother   . Hypertension Father   . Hypertension Sister   . Diabetes Sister   . Prostate cancer Brother   . Lupus Sister   . Kidney failure Father   The patient's father died from CHF complications age 62. The patient's mother is 43 y/o as of December 2016. The patient has 9 brothers, 8 sisters. One brother has prostate  cancer. There is no other cancer history in the family to his knowledge.  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 His wife of 50 years, [name], is disabled due to RA. Daughtr [name] works as a Teacher, early years/pre for [employer] (she previously worked at the [employer] in records). Son [name] owns a window washing business. Son [name] is in Press photographer at [employer]. All live in [place]. The patient has 3 grandchildren. He attends a local [employer] DIRECTIVES: Not in place   HEALTH MAINTENANCE: Social History  Substance Use Topics  . Smoking status: Never Smoker  . Smokeless tobacco: Never Used  . Alcohol use Yes     Comment: socially     Colonoscopy:  PSA: 2.27 on 01/22/2015 When the patient Dr. Hepatitis serologies (be surface antigen, hepatitis B and hepatitis see IgM) negative 01/18/2015  No Known Allergies  Current Outpatient Prescriptions  Medication Sig Dispense Refill  . diazepam (VALIUM) 5 MG tablet Take 1 tab 1 hour before scan, may repeat x1 5 tablet 0  . lisinopril-hydrochlorothiazide (PRINZIDE,ZESTORETIC) 20-25 MG tablet Take 1 tablet by mouth daily. 30 tablet 1  . lovastatin (MEVACOR) 20 MG tablet Take 1 tablet (20 mg total) by mouth at bedtime. 30 tablet 2  . sildenafil (VIAGRA) 50 MG tablet Take 1 tablet (50 mg total) by mouth daily  as needed for erectile dysfunction. 10 tablet 0   No current facility-administered medications for this visit.     OBJECTIVE: Middle-aged African-American man Who appears well Vitals:   12/06/15 1045  BP: 131/74  Pulse: 61  Resp: 18  Temp: 97.9 F (36.6 C)     Body mass index is 29.44 kg/m.    ECOG FS:0 - Asymptomatic  Sclerae unicteric, EOMs intact Oropharynx clear and moist No cervical or supraclavicular adenopathy Lungs no rales or rhonchi Heart regular rate and rhythm Abd soft, nontender, positive bowel sounds MSK no focal spinal tenderness, no upper  extremity lymphedema Neuro: nonfocal, well oriented, appropriate affect     LAB RESULTS:  CMP     Component Value Date/Time   NA 139 11/26/2015 0737   K 3.7 11/26/2015 0737   CL 102 02/08/2015 0705   CO2 25 11/26/2015 0737   GLUCOSE 93 11/26/2015 0737   BUN 16.6 11/26/2015 0737   CREATININE 1.1 11/26/2015 0737   CALCIUM 9.1 11/26/2015 0737   PROT 7.1 11/26/2015 0737   ALBUMIN 3.9 11/26/2015 0737   AST 23 11/26/2015 0737   ALT 29 11/26/2015 0737   ALKPHOS 45 11/26/2015 0737   BILITOT 0.39 11/26/2015 0737   GFRNONAA >60 02/08/2015 0705   GFRAA >60 02/08/2015 0705    INo results found for: SPEP, UPEP  Lab Results  Component Value Date   WBC 3.5 (L) 11/26/2015   NEUTROABS 1.9 11/26/2015   HGB 12.3 (L) 11/26/2015   HCT 37.9 (L) 11/26/2015   MCV 84.4 11/26/2015   PLT 320 11/26/2015      Chemistry      Component Value Date/Time   NA 139 11/26/2015 0737   K 3.7 11/26/2015 0737   CL 102 02/08/2015 0705   CO2 25 11/26/2015 0737   BUN 16.6 11/26/2015 0737   CREATININE 1.1 11/26/2015 0737      Component Value Date/Time   CALCIUM 9.1 11/26/2015 0737   ALKPHOS 45 11/26/2015 0737   AST 23 11/26/2015 0737   ALT 29 11/26/2015 0737   BILITOT 0.39 11/26/2015 0737       No results found for: LABCA2  No components found for: [id number]  No results for input(s): INR in the last 168 hours.  Urinalysis    Component Value Date/Time   COLORURINE YELLOW 01/23/2015 0805   APPEARANCEUR CLEAR 01/23/2015 0805   LABSPEC 1.014 01/23/2015 0805   PHURINE 7.5 01/23/2015 0805   GLUCOSEU NEGATIVE 01/23/2015 0805   HGBUR NEGATIVE 01/23/2015 0805   BILIRUBINUR NEGATIVE 01/23/2015 0805   KETONESUR NEGATIVE 01/23/2015 0805   PROTEINUR NEGATIVE 01/23/2015 0805   NITRITE NEGATIVE 01/23/2015 0805   LEUKOCYTESUR NEGATIVE 01/23/2015 0805   Prostate Specific Ag, Serum 0.0 - 4.0 ng/mL 4.2 (H) 2.27R, CM         Ref Range 8d ago (08/08/15) 319moago (07/05/15) 221mogo (06/14/15) 19m68mogo (05/24/15)    LDH 125 - 245 U/L 261 (H) 328 (H) 316 (H) 271 (H)      Results for EBRWILFORD, MERRYFIELDRN [id number] of 08/16/2015 08:21  Ref. Range 08/08/2015 15:33  Beta 2 Latest Ref Range: 0.6-2.4 mg/L 1.6   Results for EBRAUREL, NGUYENRN [id number] of 12/06/2015 10:51  Ref. Range 01/22/2015 04:00 08/08/2015 14:40 10/11/2015 09:17 11/26/2015 07:37  PSA Latest Ref Range: 0.0 - 4.0 ng/mL 2.27 4.2 (H) 3.2 2.8    STUDIES: Nm Pet Image Restag (ps) Skull Base To Thigh  Result Date: 11/29/2015 CLINICAL  DATA:  Subsequent treatment strategy for mantle cell lymphoma. EXAM: NUCLEAR MEDICINE PET SKULL BASE TO THIGH TECHNIQUE: 9.95 mCi F-18 FDG was injected intravenously. Full-ring PET imaging was performed from the skull base to thigh after the radiotracer. CT data was obtained and used for attenuation correction and anatomic localization. FASTING BLOOD GLUCOSE:  Value: 110 mg/dl COMPARISON:  06/11/2015 FINDINGS: NECK No hypermetabolic lymph nodes in the neck. CHEST No hypermetabolic mediastinal or hilar nodes. Right juxtadiaphragmatic nodes measuring up to 10 mm short axis (series 4/image 82), previously 13 mm, non FDG avid. No suspicious pulmonary nodules on the CT scan. Right chest port terminating at the cavoatrial junction. Mild coronary atherosclerosis in the LAD and right coronary artery. ABDOMEN/PELVIS No abnormal hypermetabolic activity within the liver, pancreas, adrenal glands, or spleen. Status post cholecystectomy. Mild atherosclerotic calcifications the abdominal aorta and branch vessels. No hypermetabolic lymph nodes in the abdomen or pelvis. SKELETON No focal hypermetabolic activity to suggest skeletal metastasis. IMPRESSION: No evidence of active lymphoma. Small right juxtadiaphragmatic nodes measuring up to 10 mm short axis, mildly decreased, non FDG avid. Electronically Signed   By: Julian Hy M.D.   On: 11/29/2015 10:37    ASSESSMENT: 65 y.o. 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 presenting with syncope secondary to bleeding from a large ulcerated duodenal ulcer  (a) s/p coil embolization of the feeding (gastro-duodenal) artery 01/23/2015  (b) anemia--scant iron stores on bone marrow biopsy--s/p feraheme 02/13/2015  (1) right inguinal lymph node biopsy 01/22/2015 confirms mantle cell lymphoma  (a) bone marrow biopsy 02/06/2015 positive for involvement by the patient's mantle cell lymphoma  (b) IPI score of 5 (high risk) predicts a 5 year progression free survival of 50% with CHOP-Rituxan chemotherapy  (c) MIPI score of 5 (intermediate risk) predicts a median survival of 58 months  (2) CHOP/Rituxan started 02/09/2015, completed 8 cycles 07/05/2015  (3)  [facility] consultation 04/06/2015. Patient opted against transplant consolidation  (4) rituximab maintenance started 08/16/2015, repeat every 2 months  (5) PSA increased noted July 2017, repeat pending in October  PLAN: [name] continues in remission. We discussed his PET scan in detail and of course this is very favorable.  We had to hold his right toxin treatment in September because of low counts. We are going to resume that today. If his counts drop again we [name] not continue rituximab but start him on ibrutinib for maintenance.  He is interested in sildenafil and I was glad to write him a prescription today --we did discuss the possible side effects and complications of that agent.  He [name] see me again with the February right toxin. I plan to repeat his PET scan in June 2018.  He knows to call for any problems that may develop before that visit.   Chauncey Cruel, MD. 12/06/15

## 2015-12-06 NOTE — Patient Instructions (Signed)
Rituximab injection What is this medicine? RITUXIMAB (ri TUX i mab) is a monoclonal antibody. It is used commonly to treat non-Hodgkin lymphoma and other conditions. It is also used to treat rheumatoid arthritis (RA). In RA, this medicine slows the inflammatory process and help reduce joint pain and swelling. This medicine is often used with other cancer or arthritis medications. This medicine may be used for other purposes; ask your health care provider or pharmacist if you have questions. What should I tell my health care provider before I take this medicine? They need to know if you have any of these conditions: -blood disorders -heart disease -history of hepatitis B -infection (especially a virus infection such as chickenpox, cold sores, or herpes) -irregular heartbeat -kidney disease -lung or breathing disease, like asthma -lupus -an unusual or allergic reaction to rituximab, mouse proteins, other medicines, foods, dyes, or preservatives -pregnant or trying to get pregnant -breast-feeding How should I use this medicine? This medicine is for infusion into a vein. It is administered in a hospital or clinic by a specially trained health care professional. A special MedGuide will be given to you by the pharmacist with each prescription and refill. Be sure to read this information carefully each time. Talk to your pediatrician regarding the use of this medicine in children. This medicine is not approved for use in children. Overdosage: If you think you have taken too much of this medicine contact a poison control center or emergency room at once. NOTE: This medicine is only for you. Do not share this medicine with others. What if I miss a dose? It is important not to miss a dose. Call your doctor or health care professional if you are unable to keep an appointment. What may interact with this medicine? -cisplatin -medicines for blood pressure -some other medicines for  arthritis -vaccines This list may not describe all possible interactions. Give your health care provider a list of all the medicines, herbs, non-prescription drugs, or dietary supplements you use. Also tell them if you smoke, drink alcohol, or use illegal drugs. Some items may interact with your medicine. What should I watch for while using this medicine? Report any side effects that you notice during your treatment right away, such as changes in your breathing, fever, chills, dizziness or lightheadedness. These effects are more common with the first dose. Visit your prescriber or health care professional for checks on your progress. You will need to have regular blood work. Report any other side effects. The side effects of this medicine can continue after you finish your treatment. Continue your course of treatment even though you feel ill unless your doctor tells you to stop. Call your doctor or health care professional for advice if you get a fever, chills or sore throat, or other symptoms of a cold or flu. Do not treat yourself. This drug decreases your body's ability to fight infections. Try to avoid being around people who are sick. This medicine may increase your risk to bruise or bleed. Call your doctor or health care professional if you notice any unusual bleeding. Be careful brushing and flossing your teeth or using a toothpick because you may get an infection or bleed more easily. If you have any dental work done, tell your dentist you are receiving this medicine. Avoid taking products that contain aspirin, acetaminophen, ibuprofen, naproxen, or ketoprofen unless instructed by your doctor. These medicines may hide a fever. Do not become pregnant while taking this medicine. Women should inform their doctor if   they wish to become pregnant or think they might be pregnant. There is a potential for serious side effects to an unborn child. Talk to your health care professional or pharmacist for more  information. Do not breast-feed an infant while taking this medicine. What side effects may I notice from receiving this medicine? Side effects that you should report to your doctor or health care professional as soon as possible: -allergic reactions like skin rash, itching or hives, swelling of the face, lips, or tongue -low blood counts - this medicine may decrease the number of white blood cells, red blood cells and platelets. You may be at increased risk for infections and bleeding. -signs of infection - fever or chills, cough, sore throat, pain or difficulty passing urine -signs of decreased platelets or bleeding - bruising, pinpoint red spots on the skin, black, tarry stools, blood in the urine -signs of decreased red blood cells - unusually weak or tired, fainting spells, lightheadedness -breathing problems -confused, not responsive -chest pain -fast, irregular heartbeat -feeling faint or lightheaded, falls -mouth sores -redness, blistering, peeling or loosening of the skin, including inside the mouth -stomach pain -swelling of the ankles, feet, or hands -trouble passing urine or change in the amount of urine Side effects that usually do not require medical attention (report to your doctor or other health care professional if they continue or are bothersome): -anxiety -headache -loss of appetite -muscle aches -nausea -night sweats This list may not describe all possible side effects. Call your doctor for medical advice about side effects. You may report side effects to FDA at 1-800-FDA-1088. Where should I keep my medicine? This drug is given in a hospital or clinic and will not be stored at home. NOTE: This sheet is a summary. It may not cover all possible information. If you have questions about this medicine, talk to your doctor, pharmacist, or health care provider.    2016, Elsevier/Gold Standard. (2014-03-29 22:30:56)  

## 2015-12-19 ENCOUNTER — Other Ambulatory Visit: Payer: Self-pay | Admitting: *Deleted

## 2015-12-24 ENCOUNTER — Encounter: Payer: Self-pay | Admitting: Oncology

## 2015-12-24 NOTE — Progress Notes (Signed)
Received signed physician form for Viagra. Faxed to [facility]. Fax received ok per confirmation sheet.

## 2016-01-21 ENCOUNTER — Other Ambulatory Visit: Payer: Self-pay | Admitting: *Deleted

## 2016-01-21 DIAGNOSIS — E785 Hyperlipidemia, unspecified: Secondary | ICD-10-CM

## 2016-01-21 MED ORDER — LISINOPRIL-HYDROCHLOROTHIAZIDE 20-25 MG PO TABS: 1.0000 | ORAL_TABLET | Freq: Every day | ORAL | 1 refills | Status: DC

## 2016-01-21 MED ORDER — LOVASTATIN 20 MG PO TABS: 20.0000 mg | ORAL_TABLET | Freq: Every day | ORAL | 1 refills | Status: DC

## 2016-01-24 ENCOUNTER — Other Ambulatory Visit: Payer: Self-pay | Admitting: *Deleted

## 2016-01-24 DIAGNOSIS — E785 Hyperlipidemia, unspecified: Secondary | ICD-10-CM

## 2016-01-24 MED ORDER — LISINOPRIL-HYDROCHLOROTHIAZIDE 20-25 MG PO TABS: 1.0000 | ORAL_TABLET | Freq: Every day | ORAL | 1 refills | Status: DC

## 2016-01-24 MED ORDER — LOVASTATIN 20 MG PO TABS: 20.0000 mg | ORAL_TABLET | Freq: Every day | ORAL | 1 refills | Status: DC

## 2016-01-31 ENCOUNTER — Ambulatory Visit: Payer: BLUE CROSS/BLUE SHIELD | Admitting: Oncology

## 2016-01-31 ENCOUNTER — Ambulatory Visit (HOSPITAL_BASED_OUTPATIENT_CLINIC_OR_DEPARTMENT_OTHER): Payer: BLUE CROSS/BLUE SHIELD

## 2016-01-31 ENCOUNTER — Other Ambulatory Visit (HOSPITAL_BASED_OUTPATIENT_CLINIC_OR_DEPARTMENT_OTHER): Payer: BLUE CROSS/BLUE SHIELD

## 2016-01-31 VITALS — BP 142/81 | HR 53 | Temp 97.9°F | Resp 18

## 2016-01-31 DIAGNOSIS — Z5112 Encounter for antineoplastic immunotherapy: Secondary | ICD-10-CM | POA: Diagnosis not present

## 2016-01-31 DIAGNOSIS — K922 Gastrointestinal hemorrhage, unspecified: Secondary | ICD-10-CM

## 2016-01-31 DIAGNOSIS — C8319 Mantle cell lymphoma, extranodal and solid organ sites: Secondary | ICD-10-CM

## 2016-01-31 DIAGNOSIS — C8315 Mantle cell lymphoma, lymph nodes of inguinal region and lower limb: Secondary | ICD-10-CM | POA: Diagnosis not present

## 2016-01-31 LAB — COMPREHENSIVE METABOLIC PANEL
ALBUMIN: 4.3 g/dL (ref 3.5–5.0)
ALK PHOS: 53 U/L (ref 40–150)
ALT: 32 U/L (ref 0–55)
AST: 24 U/L (ref 5–34)
Anion Gap: 12 mEq/L — ABNORMAL HIGH (ref 3–11)
BUN: 20.5 mg/dL (ref 7.0–26.0)
CO2: 28 meq/L (ref 22–29)
Calcium: 9.6 mg/dL (ref 8.4–10.4)
Chloride: 101 mEq/L (ref 98–109)
Creatinine: 1.1 mg/dL (ref 0.7–1.3)
EGFR: 81 mL/min/{1.73_m2} — ABNORMAL LOW (ref >90)
GLUCOSE: 77 mg/dL (ref 70–140)
POTASSIUM: 3.7 meq/L (ref 3.5–5.1)
SODIUM: 141 meq/L (ref 136–145)
Total Bilirubin: 0.54 mg/dL (ref 0.20–1.20)
Total Protein: 7.5 g/dL (ref 6.4–8.3)

## 2016-01-31 LAB — CBC WITH DIFFERENTIAL/PLATELET
BASO%: 0.4 % (ref 0.0–2.0)
Basophils Absolute: 0 10*3/uL (ref 0.0–0.1)
EOS%: 2.6 % (ref 0.0–7.0)
Eosinophils Absolute: 0.1 10*3/uL (ref 0.0–0.5)
HCT: 39.8 % (ref 38.4–49.9)
HEMOGLOBIN: 12.9 g/dL — AB (ref 13.0–17.1)
LYMPH%: 32.6 % (ref 14.0–49.0)
MCH: 27.4 pg (ref 27.2–33.4)
MCHC: 32.4 g/dL (ref 32.0–36.0)
MCV: 84.7 fL (ref 79.3–98.0)
MONO#: 0.3 10*3/uL (ref 0.1–0.9)
MONO%: 10.3 % (ref 0.0–14.0)
NEUT%: 54.1 % (ref 39.0–75.0)
NEUTROS ABS: 1.5 10*3/uL (ref 1.5–6.5)
Platelets: 318 10*3/uL (ref 140–400)
RBC: 4.7 10*6/uL (ref 4.20–5.82)
RDW: 13.9 % (ref 11.0–14.6)
WBC: 2.7 10*3/uL — AB (ref 4.0–10.3)
lymph#: 0.9 10*3/uL (ref 0.9–3.3)

## 2016-01-31 LAB — LACTATE DEHYDROGENASE: LDH: 280 U/L — ABNORMAL HIGH (ref 125–245)

## 2016-01-31 MED ORDER — SODIUM CHLORIDE 0.9% FLUSH
10.0000 mL | INTRAVENOUS | Status: DC | PRN
  Administered 2016-01-31: 10 mL
  Filled 2016-01-31: qty 10

## 2016-01-31 MED ORDER — DIPHENHYDRAMINE HCL 25 MG PO CAPS
ORAL_CAPSULE | ORAL | Status: AC
  Filled 2016-01-31: qty 2

## 2016-01-31 MED ORDER — SODIUM CHLORIDE 0.9 % IV SOLN
Freq: Once | INTRAVENOUS | Status: AC
  Administered 2016-01-31: 09:00:00 via INTRAVENOUS

## 2016-01-31 MED ORDER — ACETAMINOPHEN 325 MG PO TABS
650.0000 mg | ORAL_TABLET | Freq: Once | ORAL | Status: AC
  Administered 2016-01-31: 650 mg via ORAL

## 2016-01-31 MED ORDER — ACETAMINOPHEN 325 MG PO TABS
ORAL_TABLET | ORAL | Status: AC
  Filled 2016-01-31: qty 2

## 2016-01-31 MED ORDER — SODIUM CHLORIDE 0.9 % IV SOLN
375.0000 mg/m2 | Freq: Once | INTRAVENOUS | Status: AC
  Administered 2016-01-31: 800 mg via INTRAVENOUS
  Filled 2016-01-31: qty 20

## 2016-01-31 MED ORDER — DIPHENHYDRAMINE HCL 25 MG PO CAPS
25.0000 mg | ORAL_CAPSULE | Freq: Once | ORAL | Status: AC
  Administered 2016-01-31: 25 mg via ORAL

## 2016-01-31 MED ORDER — HEPARIN SOD (PORK) LOCK FLUSH 100 UNIT/ML IV SOLN
500.0000 [IU] | Freq: Once | INTRAVENOUS | Status: AC | PRN
  Administered 2016-01-31: 500 [IU]
  Filled 2016-01-31: qty 5

## 2016-01-31 NOTE — Patient Instructions (Signed)
[facility] Discharge Instructions for Patients Receiving Chemotherapy  Today you received the following chemotherapy agents Rituxan  To help prevent nausea and vomiting after your treatment, we encourage you to take your nausea medication    If you develop nausea and vomiting that is not controlled by your nausea medication, call the clinic.   BELOW ARE SYMPTOMS THAT SHOULD BE REPORTED IMMEDIATELY:  *FEVER GREATER THAN 100.5 F  *CHILLS WITH OR WITHOUT FEVER  NAUSEA AND VOMITING THAT IS NOT CONTROLLED WITH YOUR NAUSEA MEDICATION  *UNUSUAL SHORTNESS OF BREATH  *UNUSUAL BRUISING OR BLEEDING  TENDERNESS IN MOUTH AND THROAT WITH OR WITHOUT PRESENCE OF ULCERS  *URINARY PROBLEMS  *BOWEL PROBLEMS  UNUSUAL RASH Items with * indicate a potential emergency and should be followed up as soon as possible.  Feel free to call the clinic you have any questions or concerns. The clinic phone number is [phone].  Please show the CHEMO ALERT CARD at check-in to the Emergency Department and triage nurse.   

## 2016-02-01 LAB — PSA: PROSTATE SPECIFIC AG, SERUM: 2.4 ng/mL (ref 0.0–4.0)

## 2016-02-01 LAB — BETA 2 MICROGLOBULIN, SERUM: Beta-2: 1.4 mg/L (ref 0.6–2.4)

## 2016-03-27 ENCOUNTER — Ambulatory Visit (HOSPITAL_BASED_OUTPATIENT_CLINIC_OR_DEPARTMENT_OTHER): Payer: BLUE CROSS/BLUE SHIELD | Admitting: Oncology

## 2016-03-27 ENCOUNTER — Other Ambulatory Visit (HOSPITAL_BASED_OUTPATIENT_CLINIC_OR_DEPARTMENT_OTHER): Payer: BLUE CROSS/BLUE SHIELD

## 2016-03-27 ENCOUNTER — Ambulatory Visit (HOSPITAL_BASED_OUTPATIENT_CLINIC_OR_DEPARTMENT_OTHER): Payer: BLUE CROSS/BLUE SHIELD

## 2016-03-27 VITALS — BP 117/74 | HR 69 | Temp 98.0°F | Resp 18 | Ht 70.0 in | Wt 211.8 lb

## 2016-03-27 VITALS — BP 127/82 | HR 58 | Temp 98.0°F | Resp 16

## 2016-03-27 DIAGNOSIS — Z5112 Encounter for antineoplastic immunotherapy: Secondary | ICD-10-CM

## 2016-03-27 DIAGNOSIS — C8315 Mantle cell lymphoma, lymph nodes of inguinal region and lower limb: Secondary | ICD-10-CM

## 2016-03-27 DIAGNOSIS — C8319 Mantle cell lymphoma, extranodal and solid organ sites: Secondary | ICD-10-CM

## 2016-03-27 LAB — CBC WITH DIFFERENTIAL/PLATELET
BASO%: 0.4 % (ref 0.0–2.0)
Basophils Absolute: 0 10*3/uL (ref 0.0–0.1)
EOS%: 2.6 % (ref 0.0–7.0)
Eosinophils Absolute: 0.1 10*3/uL (ref 0.0–0.5)
HCT: 40.5 % (ref 38.4–49.9)
HGB: 13.1 g/dL (ref 13.0–17.1)
LYMPH%: 42.1 % (ref 14.0–49.0)
MCH: 27.3 pg (ref 27.2–33.4)
MCHC: 32.3 g/dL (ref 32.0–36.0)
MCV: 84.4 fL (ref 79.3–98.0)
MONO#: 0.3 10*3/uL (ref 0.1–0.9)
MONO%: 14 % (ref 0.0–14.0)
NEUT#: 1 10*3/uL — ABNORMAL LOW (ref 1.5–6.5)
NEUT%: 40.9 % (ref 39.0–75.0)
Platelets: 289 10*3/uL (ref 140–400)
RBC: 4.8 10*6/uL (ref 4.20–5.82)
RDW: 13.7 % (ref 11.0–14.6)
WBC: 2.4 10*3/uL — ABNORMAL LOW (ref 4.0–10.3)
lymph#: 1 10*3/uL (ref 0.9–3.3)

## 2016-03-27 LAB — COMPREHENSIVE METABOLIC PANEL
ALT: 29 U/L (ref 0–55)
AST: 20 U/L (ref 5–34)
Albumin: 4.3 g/dL (ref 3.5–5.0)
Alkaline Phosphatase: 50 U/L (ref 40–150)
Anion Gap: 11 mEq/L (ref 3–11)
BUN: 19.4 mg/dL (ref 7.0–26.0)
CALCIUM: 9.8 mg/dL (ref 8.4–10.4)
CHLORIDE: 103 meq/L (ref 98–109)
CO2: 26 meq/L (ref 22–29)
Creatinine: 1.3 mg/dL (ref 0.7–1.3)
EGFR: 70 mL/min/{1.73_m2} — ABNORMAL LOW (ref >90)
GLUCOSE: 97 mg/dL (ref 70–140)
Potassium: 4.3 mEq/L (ref 3.5–5.1)
SODIUM: 141 meq/L (ref 136–145)
Total Bilirubin: 0.44 mg/dL (ref 0.20–1.20)
Total Protein: 7.5 g/dL (ref 6.4–8.3)

## 2016-03-27 MED ORDER — ACETAMINOPHEN 325 MG PO TABS
650.0000 mg | ORAL_TABLET | Freq: Once | ORAL | Status: AC
  Administered 2016-03-27: 650 mg via ORAL

## 2016-03-27 MED ORDER — SODIUM CHLORIDE 0.9% FLUSH
10.0000 mL | INTRAVENOUS | Status: DC | PRN
  Administered 2016-03-27: 10 mL
  Filled 2016-03-27: qty 10

## 2016-03-27 MED ORDER — SODIUM CHLORIDE 0.9 % IV SOLN
375.0000 mg/m2 | Freq: Once | INTRAVENOUS | Status: AC
  Administered 2016-03-27: 800 mg via INTRAVENOUS
  Filled 2016-03-27: qty 50

## 2016-03-27 MED ORDER — HEPARIN SOD (PORK) LOCK FLUSH 100 UNIT/ML IV SOLN
500.0000 [IU] | Freq: Once | INTRAVENOUS | Status: AC | PRN
  Administered 2016-03-27: 500 [IU]
  Filled 2016-03-27: qty 5

## 2016-03-27 MED ORDER — DIPHENHYDRAMINE HCL 25 MG PO CAPS
ORAL_CAPSULE | ORAL | Status: AC
  Filled 2016-03-27: qty 1

## 2016-03-27 MED ORDER — SODIUM CHLORIDE 0.9 % IV SOLN
Freq: Once | INTRAVENOUS | Status: AC
  Administered 2016-03-27: 10:00:00 via INTRAVENOUS

## 2016-03-27 MED ORDER — DIPHENHYDRAMINE HCL 25 MG PO CAPS
25.0000 mg | ORAL_CAPSULE | Freq: Once | ORAL | Status: AC
  Administered 2016-03-27: 25 mg via ORAL

## 2016-03-27 MED ORDER — ACETAMINOPHEN 325 MG PO TABS
ORAL_TABLET | ORAL | Status: AC
  Filled 2016-03-27: qty 2
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
## 2016-03-27 NOTE — Patient Instructions (Signed)
Rituximab injection What is this medicine? RITUXIMAB (ri TUX i mab) is a monoclonal antibody. It is used commonly to treat non-Hodgkin lymphoma and other conditions. It is also used to treat rheumatoid arthritis (RA). In RA, this medicine slows the inflammatory process and help reduce joint pain and swelling. This medicine is often used with other cancer or arthritis medications. This medicine may be used for other purposes; ask your health care provider or pharmacist if you have questions. What should I tell my health care provider before I take this medicine? They need to know if you have any of these conditions: -blood disorders -heart disease -history of hepatitis B -infection (especially a virus infection such as chickenpox, cold sores, or herpes) -irregular heartbeat -kidney disease -lung or breathing disease, like asthma -lupus -an unusual or allergic reaction to rituximab, mouse proteins, other medicines, foods, dyes, or preservatives -pregnant or trying to get pregnant -breast-feeding How should I use this medicine? This medicine is for infusion into a vein. It is administered in a hospital or clinic by a specially trained health care professional. A special MedGuide will be given to you by the pharmacist with each prescription and refill. Be sure to read this information carefully each time. Talk to your pediatrician regarding the use of this medicine in children. This medicine is not approved for use in children. Overdosage: If you think you have taken too much of this medicine contact a poison control center or emergency room at once. NOTE: This medicine is only for you. Do not share this medicine with others. What if I miss a dose? It is important not to miss a dose. Call your doctor or health care professional if you are unable to keep an appointment. What may interact with this medicine? -cisplatin -medicines for blood pressure -some other medicines for  arthritis -vaccines This list may not describe all possible interactions. Give your health care provider a list of all the medicines, herbs, non-prescription drugs, or dietary supplements you use. Also tell them if you smoke, drink alcohol, or use illegal drugs. Some items may interact with your medicine. What should I watch for while using this medicine? Report any side effects that you notice during your treatment right away, such as changes in your breathing, fever, chills, dizziness or lightheadedness. These effects are more common with the first dose. Visit your prescriber or health care professional for checks on your progress. You will need to have regular blood work. Report any other side effects. The side effects of this medicine can continue after you finish your treatment. Continue your course of treatment even though you feel ill unless your doctor tells you to stop. Call your doctor or health care professional for advice if you get a fever, chills or sore throat, or other symptoms of a cold or flu. Do not treat yourself. This drug decreases your body's ability to fight infections. Try to avoid being around people who are sick. This medicine may increase your risk to bruise or bleed. Call your doctor or health care professional if you notice any unusual bleeding. Be careful brushing and flossing your teeth or using a toothpick because you may get an infection or bleed more easily. If you have any dental work done, tell your dentist you are receiving this medicine. Avoid taking products that contain aspirin, acetaminophen, ibuprofen, naproxen, or ketoprofen unless instructed by your doctor. These medicines may hide a fever. Do not become pregnant while taking this medicine. Women should inform their doctor if   they wish to become pregnant or think they might be pregnant. There is a potential for serious side effects to an unborn child. Talk to your health care professional or pharmacist for more  information. Do not breast-feed an infant while taking this medicine. What side effects may I notice from receiving this medicine? Side effects that you should report to your doctor or health care professional as soon as possible: -allergic reactions like skin rash, itching or hives, swelling of the face, lips, or tongue -low blood counts - this medicine may decrease the number of white blood cells, red blood cells and platelets. You may be at increased risk for infections and bleeding. -signs of infection - fever or chills, cough, sore throat, pain or difficulty passing urine -signs of decreased platelets or bleeding - bruising, pinpoint red spots on the skin, black, tarry stools, blood in the urine -signs of decreased red blood cells - unusually weak or tired, fainting spells, lightheadedness -breathing problems -confused, not responsive -chest pain -fast, irregular heartbeat -feeling faint or lightheaded, falls -mouth sores -redness, blistering, peeling or loosening of the skin, including inside the mouth -stomach pain -swelling of the ankles, feet, or hands -trouble passing urine or change in the amount of urine Side effects that usually do not require medical attention (report to your doctor or other health care professional if they continue or are bothersome): -anxiety -headache -loss of appetite -muscle aches -nausea -night sweats This list may not describe all possible side effects. Call your doctor for medical advice about side effects. You may report side effects to FDA at 1-800-FDA-1088. Where should I keep my medicine? This drug is given in a hospital or clinic and will not be stored at home. NOTE: This sheet is a summary. It may not cover all possible information. If you have questions about this medicine, talk to your doctor, pharmacist, or health care provider.    2016, Elsevier/Gold Standard. (2014-03-29 22:30:56)  

## 2016-03-28 LAB — PSA: Prostate Specific Ag, Serum: 2.3 ng/mL (ref 0.0–4.0)

## 2016-03-28 LAB — BETA 2 MICROGLOBULIN, SERUM: Beta-2: 1.3 mg/L (ref 0.6–2.4)

## 2016-05-22 ENCOUNTER — Other Ambulatory Visit (HOSPITAL_BASED_OUTPATIENT_CLINIC_OR_DEPARTMENT_OTHER): Payer: BLUE CROSS/BLUE SHIELD

## 2016-05-22 ENCOUNTER — Ambulatory Visit (HOSPITAL_BASED_OUTPATIENT_CLINIC_OR_DEPARTMENT_OTHER): Payer: BLUE CROSS/BLUE SHIELD | Admitting: Oncology

## 2016-05-22 ENCOUNTER — Ambulatory Visit (HOSPITAL_BASED_OUTPATIENT_CLINIC_OR_DEPARTMENT_OTHER): Payer: BLUE CROSS/BLUE SHIELD

## 2016-05-22 VITALS — BP 116/70 | HR 58 | Temp 98.0°F | Resp 18

## 2016-05-22 VITALS — BP 127/80 | HR 64 | Resp 18 | Wt 210.7 lb

## 2016-05-22 DIAGNOSIS — C8315 Mantle cell lymphoma, lymph nodes of inguinal region and lower limb: Secondary | ICD-10-CM | POA: Diagnosis not present

## 2016-05-22 DIAGNOSIS — C8319 Mantle cell lymphoma, extranodal and solid organ sites: Secondary | ICD-10-CM

## 2016-05-22 DIAGNOSIS — Z5112 Encounter for antineoplastic immunotherapy: Secondary | ICD-10-CM

## 2016-05-22 DIAGNOSIS — F40240 Claustrophobia: Secondary | ICD-10-CM

## 2016-05-22 LAB — COMPREHENSIVE METABOLIC PANEL
ALT: 23 U/L (ref 0–55)
AST: 22 U/L (ref 5–34)
Albumin: 4.2 g/dL (ref 3.5–5.0)
Alkaline Phosphatase: 39 U/L — ABNORMAL LOW (ref 40–150)
Anion Gap: 11 mEq/L (ref 3–11)
BUN: 22.6 mg/dL (ref 7.0–26.0)
CO2: 27 meq/L (ref 22–29)
Calcium: 9.3 mg/dL (ref 8.4–10.4)
Chloride: 102 mEq/L (ref 98–109)
Creatinine: 1.1 mg/dL (ref 0.7–1.3)
EGFR: 85 mL/min/{1.73_m2} — AB (ref >90)
GLUCOSE: 102 mg/dL (ref 70–140)
POTASSIUM: 4 meq/L (ref 3.5–5.1)
SODIUM: 140 meq/L (ref 136–145)
TOTAL PROTEIN: 7.2 g/dL (ref 6.4–8.3)
Total Bilirubin: 0.5 mg/dL (ref 0.20–1.20)

## 2016-05-22 LAB — CBC WITH DIFFERENTIAL/PLATELET
BASO%: 0.3 % (ref 0.0–2.0)
BASOS ABS: 0 10*3/uL (ref 0.0–0.1)
EOS ABS: 0.1 10*3/uL (ref 0.0–0.5)
EOS%: 1.6 % (ref 0.0–7.0)
HCT: 36.9 % — ABNORMAL LOW (ref 38.4–49.9)
HGB: 12.1 g/dL — ABNORMAL LOW (ref 13.0–17.1)
LYMPH%: 28.3 % (ref 14.0–49.0)
MCH: 27.8 pg (ref 27.2–33.4)
MCHC: 32.8 g/dL (ref 32.0–36.0)
MCV: 84.7 fL (ref 79.3–98.0)
MONO#: 0.5 10*3/uL (ref 0.1–0.9)
MONO%: 16 % — ABNORMAL HIGH (ref 0.0–14.0)
NEUT%: 53.8 % (ref 39.0–75.0)
NEUTROS ABS: 1.7 10*3/uL (ref 1.5–6.5)
Platelets: 313 10*3/uL (ref 140–400)
RBC: 4.36 10*6/uL (ref 4.20–5.82)
RDW: 14.2 % (ref 11.0–14.6)
WBC: 3.1 10*3/uL — AB (ref 4.0–10.3)
lymph#: 0.9 10*3/uL (ref 0.9–3.3)

## 2016-05-22 LAB — LACTATE DEHYDROGENASE: LDH: 220 U/L (ref 125–245)

## 2016-05-22 MED ORDER — ACETAMINOPHEN 325 MG PO TABS
650.0000 mg | ORAL_TABLET | Freq: Once | ORAL | Status: AC
  Administered 2016-05-22: 650 mg via ORAL

## 2016-05-22 MED ORDER — DIAZEPAM 5 MG PO TABS: ORAL_TABLET | ORAL | 0 refills | Status: DC

## 2016-05-22 MED ORDER — SODIUM CHLORIDE 0.9 % IV SOLN
375.0000 mg/m2 | Freq: Once | INTRAVENOUS | Status: AC
  Administered 2016-05-22: 800 mg via INTRAVENOUS
  Filled 2016-05-22: qty 50

## 2016-05-22 MED ORDER — SODIUM CHLORIDE 0.9 % IV SOLN
Freq: Once | INTRAVENOUS | Status: AC
  Administered 2016-05-22: 10:00:00 via INTRAVENOUS

## 2016-05-22 MED ORDER — HEPARIN SOD (PORK) LOCK FLUSH 100 UNIT/ML IV SOLN
500.0000 [IU] | Freq: Once | INTRAVENOUS | Status: AC | PRN
  Administered 2016-05-22: 500 [IU]
  Filled 2016-05-22: qty 5

## 2016-05-22 MED ORDER — DIPHENHYDRAMINE HCL 25 MG PO CAPS
25.0000 mg | ORAL_CAPSULE | Freq: Once | ORAL | Status: AC
  Administered 2016-05-22: 25 mg via ORAL

## 2016-05-22 MED ORDER — ACETAMINOPHEN 325 MG PO TABS
ORAL_TABLET | ORAL | Status: AC
  Filled 2016-05-22: qty 2

## 2016-05-22 MED ORDER — DIPHENHYDRAMINE HCL 25 MG PO CAPS
ORAL_CAPSULE | ORAL | Status: AC
  Filled 2016-05-22: qty 1

## 2016-05-22 MED ORDER — SODIUM CHLORIDE 0.9% FLUSH
10.0000 mL | INTRAVENOUS | Status: DC | PRN
  Administered 2016-05-22: 10 mL
  Filled 2016-05-22: qty 10
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
## 2016-05-22 NOTE — Patient Instructions (Signed)
[facility] Discharge Instructions for Patients Receiving Chemotherapy  Today you received the following chemotherapy agents: Rituxan   To help prevent nausea and vomiting after your treatment, we encourage you to take your nausea medication as directed.    If you develop nausea and vomiting that is not controlled by your nausea medication, call the clinic.   BELOW ARE SYMPTOMS THAT SHOULD BE REPORTED IMMEDIATELY:  *FEVER GREATER THAN 100.5 F  *CHILLS WITH OR WITHOUT FEVER  NAUSEA AND VOMITING THAT IS NOT CONTROLLED WITH YOUR NAUSEA MEDICATION  *UNUSUAL SHORTNESS OF BREATH  *UNUSUAL BRUISING OR BLEEDING  TENDERNESS IN MOUTH AND THROAT WITH OR WITHOUT PRESENCE OF ULCERS  *URINARY PROBLEMS  *BOWEL PROBLEMS  UNUSUAL RASH Items with * indicate a potential emergency and should be followed up as soon as possible.  Feel free to call the clinic you have any questions or concerns. The clinic phone number is [phone].  Please show the CHEMO ALERT CARD at check-in to the Emergency Department and triage nurse.   

## 2016-05-23 LAB — BETA 2 MICROGLOBULIN, SERUM: BETA 2: 1.3 mg/L (ref 0.6–2.4)

## 2016-05-23 LAB — PSA: PROSTATE SPECIFIC AG, SERUM: 2 ng/mL (ref 0.0–4.0)

## 2016-05-29 ENCOUNTER — Encounter (HOSPITAL_COMMUNITY)
Admission: RE | Admit: 2016-05-29 | Discharge: 2016-05-29 | Disposition: A | Discharge status: Home / Self Care | Payer: BLUE CROSS/BLUE SHIELD | Source: Ambulatory Visit | Attending: Oncology | Admitting: Oncology

## 2016-05-29 DIAGNOSIS — C8319 Mantle cell lymphoma, extranodal and solid organ sites: Secondary | ICD-10-CM | POA: Diagnosis not present

## 2016-05-29 LAB — GLUCOSE, CAPILLARY: GLUCOSE-CAPILLARY: 112 mg/dL — AB (ref 65–99)

## 2016-05-29 IMAGING — PT NM PET TUM IMG RESTAG (PS) SKULL BASE T - THIGH
1 of 7 series · 1 of 25 positions shown · non-contrast
Comparison: [DATE]

CLINICAL DATA: Subsequent treatment strategy for mantle cell
lymphoma. Undergoing immunotherapy.

EXAM:
NUCLEAR MEDICINE PET SKULL BASE TO THIGH
TECHNIQUE: 10.6 mCi F-18 FDG was injected intravenously. Full-ring PET imaging
was performed from the skull base to thigh after the radiotracer. CT
data was obtained and used for attenuation correction and anatomic
localization.
FASTING BLOOD GLUCOSE:  Value: 112 mg/dl

[Series 6: ct sk_thigh 5.0 b31f · axial · 5.0mm · 0.98mm/px · 1 of 220 slices shown]
[im 220/220  brain]
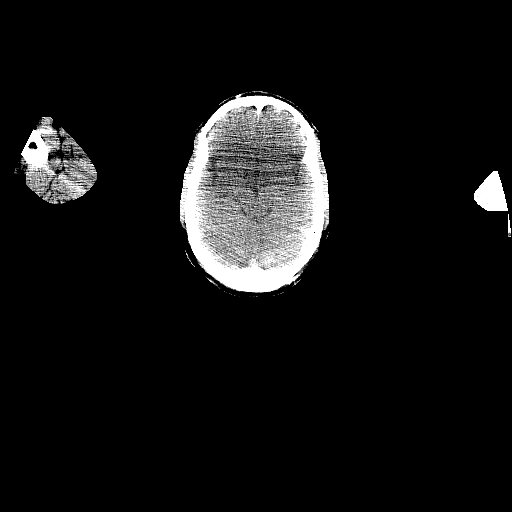

[1 of 25 positions shown; findings below may reference images not displayed]

FINDINGS: NECK

No hypermetabolic lymph nodes in the neck.

CHEST

No hypermetabolic mediastinal or hilar nodes. No suspicious
pulmonary nodules on the CT scan.

ABDOMEN/PELVIS

No abnormal hypermetabolic activity within the liver, pancreas,
adrenal glands, or spleen. No hypermetabolic lymph nodes in the
abdomen or pelvis.

SKELETON

No focal hypermetabolic activity to suggest skeletal metastasis.
IMPRESSION: Negative.  No evidence of metabolically active lymphoma.

## 2016-05-29 MED ORDER — FLUDEOXYGLUCOSE F - 18 (FDG) INJECTION
10.6000 | Freq: Once | INTRAVENOUS | Status: AC | PRN
  Administered 2016-05-29: 10.6 via INTRAVENOUS

## 2016-05-30 ENCOUNTER — Other Ambulatory Visit: Payer: Self-pay | Admitting: Oncology

## 2016-07-21 ENCOUNTER — Ambulatory Visit (HOSPITAL_BASED_OUTPATIENT_CLINIC_OR_DEPARTMENT_OTHER): Payer: BLUE CROSS/BLUE SHIELD

## 2016-07-21 ENCOUNTER — Ambulatory Visit (HOSPITAL_BASED_OUTPATIENT_CLINIC_OR_DEPARTMENT_OTHER): Payer: BLUE CROSS/BLUE SHIELD | Admitting: Oncology

## 2016-07-21 ENCOUNTER — Other Ambulatory Visit: Payer: Self-pay | Admitting: *Deleted

## 2016-07-21 ENCOUNTER — Other Ambulatory Visit (HOSPITAL_BASED_OUTPATIENT_CLINIC_OR_DEPARTMENT_OTHER): Payer: BLUE CROSS/BLUE SHIELD

## 2016-07-21 VITALS — BP 105/65 | HR 63 | Temp 98.1°F | Resp 18

## 2016-07-21 VITALS — BP 131/93 | HR 81 | Temp 97.8°F | Resp 18 | Ht 70.0 in | Wt 208.4 lb

## 2016-07-21 DIAGNOSIS — C8319 Mantle cell lymphoma, extranodal and solid organ sites: Secondary | ICD-10-CM

## 2016-07-21 DIAGNOSIS — Z5112 Encounter for antineoplastic immunotherapy: Secondary | ICD-10-CM

## 2016-07-21 DIAGNOSIS — C8315 Mantle cell lymphoma, lymph nodes of inguinal region and lower limb: Secondary | ICD-10-CM

## 2016-07-21 DIAGNOSIS — E785 Hyperlipidemia, unspecified: Secondary | ICD-10-CM

## 2016-07-21 LAB — COMPREHENSIVE METABOLIC PANEL
ALBUMIN: 4.1 g/dL (ref 3.5–5.0)
ALK PHOS: 51 U/L (ref 40–150)
ALT: 24 U/L (ref 0–55)
AST: 22 U/L (ref 5–34)
Anion Gap: 11 mEq/L (ref 3–11)
BUN: 24.6 mg/dL (ref 7.0–26.0)
CALCIUM: 9.6 mg/dL (ref 8.4–10.4)
CO2: 27 mEq/L (ref 22–29)
CREATININE: 1.2 mg/dL (ref 0.7–1.3)
Chloride: 103 mEq/L (ref 98–109)
EGFR: 71 mL/min/{1.73_m2} — ABNORMAL LOW (ref >90)
GLUCOSE: 95 mg/dL (ref 70–140)
Potassium: 4.1 mEq/L (ref 3.5–5.1)
SODIUM: 141 meq/L (ref 136–145)
TOTAL PROTEIN: 7.4 g/dL (ref 6.4–8.3)
Total Bilirubin: 0.28 mg/dL (ref 0.20–1.20)

## 2016-07-21 LAB — CBC WITH DIFFERENTIAL/PLATELET
BASO%: 0.7 % (ref 0.0–2.0)
Basophils Absolute: 0 10*3/uL (ref 0.0–0.1)
EOS ABS: 0.1 10*3/uL (ref 0.0–0.5)
EOS%: 5.1 % (ref 0.0–7.0)
HCT: 37.9 % — ABNORMAL LOW (ref 38.4–49.9)
HGB: 12.2 g/dL — ABNORMAL LOW (ref 13.0–17.1)
LYMPH%: 35.1 % (ref 14.0–49.0)
MCH: 28.1 pg (ref 27.2–33.4)
MCHC: 32.2 g/dL (ref 32.0–36.0)
MCV: 87.3 fL (ref 79.3–98.0)
MONO#: 0.6 10*3/uL (ref 0.1–0.9)
MONO%: 20.7 % — ABNORMAL HIGH (ref 0.0–14.0)
NEUT#: 1.1 10*3/uL — ABNORMAL LOW (ref 1.5–6.5)
NEUT%: 38.4 % — AB (ref 39.0–75.0)
PLATELETS: 306 10*3/uL (ref 140–400)
RBC: 4.34 10*6/uL (ref 4.20–5.82)
RDW: 13.9 % (ref 11.0–14.6)
WBC: 2.8 10*3/uL — ABNORMAL LOW (ref 4.0–10.3)
lymph#: 1 10*3/uL (ref 0.9–3.3)

## 2016-07-21 LAB — LACTATE DEHYDROGENASE: LDH: 315 U/L — ABNORMAL HIGH (ref 125–245)

## 2016-07-21 MED ORDER — LISINOPRIL-HYDROCHLOROTHIAZIDE 20-25 MG PO TABS: 1.0000 | ORAL_TABLET | Freq: Every day | ORAL | 1 refills | Status: DC

## 2016-07-21 MED ORDER — HEPARIN SOD (PORK) LOCK FLUSH 100 UNIT/ML IV SOLN
500.0000 [IU] | Freq: Once | INTRAVENOUS | Status: AC | PRN
  Administered 2016-07-21: 500 [IU]
  Filled 2016-07-21: qty 5

## 2016-07-21 MED ORDER — DIPHENHYDRAMINE HCL 25 MG PO CAPS
ORAL_CAPSULE | ORAL | Status: AC
  Filled 2016-07-21: qty 1

## 2016-07-21 MED ORDER — SODIUM CHLORIDE 0.9 % IV SOLN
375.0000 mg/m2 | Freq: Once | INTRAVENOUS | Status: AC
  Administered 2016-07-21: 800 mg via INTRAVENOUS
  Filled 2016-07-21: qty 30

## 2016-07-21 MED ORDER — SODIUM CHLORIDE 0.9% FLUSH
10.0000 mL | INTRAVENOUS | Status: DC | PRN
  Administered 2016-07-21: 10 mL
  Filled 2016-07-21: qty 10

## 2016-07-21 MED ORDER — ACETAMINOPHEN 325 MG PO TABS
650.0000 mg | ORAL_TABLET | Freq: Once | ORAL | Status: AC
  Administered 2016-07-21: 650 mg via ORAL

## 2016-07-21 MED ORDER — LOVASTATIN 20 MG PO TABS: 20.0000 mg | ORAL_TABLET | Freq: Every day | ORAL | 0 refills | Status: DC

## 2016-07-21 MED ORDER — ACETAMINOPHEN 325 MG PO TABS
ORAL_TABLET | ORAL | Status: AC
Start: 2016-07-21 — End: 2016-07-21
  Filled 2016-07-21: qty 2

## 2016-07-21 MED ORDER — DIPHENHYDRAMINE HCL 25 MG PO CAPS
25.0000 mg | ORAL_CAPSULE | Freq: Once | ORAL | Status: AC
  Administered 2016-07-21: 25 mg via ORAL

## 2016-07-21 MED ORDER — SODIUM CHLORIDE 0.9 % IV SOLN
Freq: Once | INTRAVENOUS | Status: AC
  Administered 2016-07-21: 10:00:00 via INTRAVENOUS

## 2016-07-21 NOTE — Progress Notes (Signed)
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 He "blew this off" and it did not happen again until 01/17/2015 when he felt weak at work and later fainted at home. He was brought to the ED where he was found to be guaiac positive (denies melena before that date) with a Hb of 4.5 MCV 82.2, platelets 295K, WBC 9.1 and normal INR and PTT. He was admitted and underwent EGD under Dr Michail Sermon 01/18/2015 showing a very large duodenal ulcer w clot. Abdominal US 01/19/2015 was not very informative but CT abd/pelvis 12/17,2016 showed adenopathy in the anterior mediastinum, retroperitoneum and pelvis, with a large ulcerated mass involving the distal stomach/proximal duodenum and extending into the gastrohepatic region.   On 01/22/2015 he underwent right inguinal lymph node biopsy, consistent with mantle cell lymphoma (([id number] and [id number])). Specifically the lymph node showed effacement of the architecture by sheets of small to medium sized lymphocytes with no apparent nodularity. The cells were positive for CD20, CD5, BCL-2, CD21, and cyclin D1. He 67 was low (10-20%). CD21 revealed a few residual germinal centers area at the B cells were  negative for CD10, and BCL-6. CD23 was negative.  On 02/06/2015 the patient underwent a bone marrow aspirate and biopsy. The pathology from this procedure (FZB 17-2 and 17-7) was positive for involvement by non-Hodgkin's lymphoma with numerous atypical interstitial and paratrabecular lymphoid aggregates consistent with mantle cell lymphoma. Iron stain showed scant iron flow cytometry confirmed a monoclonal B-cell population, lambda restricted, consistent with the patient's known diagnosis of mantle cell lymphoma.  On 01/18/2015 the patient underwent EGD which found a massive duodenal bulb ulcer with adherent clot, although no active bleeding.his hemoglobin was in the 8.4 range and stable. He was discharged on 01/22/2015, but within 24 hours was readmitted with syncope and altered mental status. He had aspirated and required intubation for his respiratory failure. Hemoglobin was down to 6.3. An NG tube was placed, the patient was multiply transfused, and Surgery was considered but felt not to be feasible.accordingly the patient underwent arteriography 01/23/2015 which found the superior mesenteric and celiac arteries not to be the involved vessels. The gastroduodenal artery showed multiple branches providing supply to the hypervascular duodenal mass, and this artery was coil embolized.. Also a port was placed at the time of this procedure with a view to eventual chemotherapy.  He was discharged 01/26/2015,but required readmission 02/07/2015 for another presyncopal episode. He presented to the emergency room where he was found to have a hemoglobin of 7.5. The patient was transfusedIIA hemoglobin of 10 and was discharged 02/07/2014. He was scheduled to start CHOP right toxin 02/08/2014.  His subsequent history is as detailed below.  INTERVAL HISTORY: [name] returns today for 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 He is currently receiving maintenance rituximab, with a dose  due  today. He tolerates the treatment well. He is not aware of any side effects from it.  REVIEW OF SYSTEMS: He checks himself for lymph nodes and has not found any. There have been no unexplained weight loss or unexplained fatigue. He is working full time. He has had no sweats, fever, rash, itching, or other systemic symptoms. A detailed review of systems today was entirely benign  PAST MEDICAL HISTORY: Past Medical History:  Diagnosis Date  . Anemia   . Cancer ([place])    Lymphoma  . Essential hypertension   . GERD (gastroesophageal reflux disease)   . GI bleed   . HLD (hyperlipidemia)   . Weakness     PAST SURGICAL HISTORY: Past Surgical History:  Procedure Laterality Date  . ESOPHAGOGASTRODUODENOSCOPY Left 01/18/2015   Procedure: ESOPHAGOGASTRODUODENOSCOPY (EGD);  Surgeon: Wilford Corner, MD;  Location: [facility] ENDOSCOPY;  Service: Endoscopy;  Laterality: Left;  . INGUINAL HERNIA REPAIR Right 01/22/2015   Procedure: RIGHT INGUINAL LYMPH NODE BX;  Surgeon: Donnie Mesa, MD;  Location: [facility];  Service: General;  Laterality: Right;  . PORTACATH PLACEMENT  01/26/2015    power port with tip SVC/RA [place]  . SKIN SURGERY     Small benign cysts over left scalp removed    FAMILY HISTORY Family History  Problem Relation Age of Onset  . Hypertension Mother   . Hypertension Father   . Hypertension Sister   . Diabetes Sister   . Prostate cancer Brother   . Lupus Sister   . Kidney failure Father   The patient's father died from CHF complications age 59. The patient's mother is 41 y/o as of December 2016. The patient has 9 brothers, 8 sisters. One brother has prostate cancer. There is no other cancer history in the family to his knowledge.  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 His wife of 31 years, [name], is disabled due to RA. Daughtr [name] works as a Teacher, early years/pre for [employer] (she previously worked at the [employer] in records). Son  [name] owns a window washing business. Son [name] is in Press photographer at [employer]. All live in [place]. The patient has 3 grandchildren. He attends a local [place] DIRECTIVES: Not in place   HEALTH MAINTENANCE: Social History  Substance Use Topics  . Smoking status: Never Smoker  . Smokeless tobacco: Never Used  . Alcohol use Yes     Comment: socially     Colonoscopy:  PSA: 2.27 on 01/22/2015 When the patient Dr. Hepatitis serologies (be surface antigen, hepatitis B and hepatitis see IgM) negative 01/18/2015  No Known Allergies  Current Outpatient Prescriptions  Medication Sig Dispense Refill  . diazepam (VALIUM) 5 MG tablet Take 1 tab 1 hour before scan, may repeat x1 5 tablet 0  . lisinopril-hydrochlorothiazide (PRINZIDE,ZESTORETIC) 20-25 MG tablet Take 1 tablet by mouth daily. 90 tablet 1  . lovastatin (MEVACOR) 20 MG tablet Take 1 tablet (20 mg total) by mouth at bedtime. 90 tablet 1   No current facility-administered medications for this visit.     OBJECTIVE: Middle-aged African-American man in no acute distress  Vitals:   07/21/16 0856  BP: (!) 131/93  Pulse: 81  Resp: 18  Temp: 97.8 F (36.6 C)     Body mass index is 29.9 kg/m.    ECOG FS:0 - Asymptomatic  Sclerae unicteric, pupils round and equal Oropharynx clear and moist No cervical or supraclavicular  adenopathy Lungs no rales or rhonchi Heart regular rate and rhythm Abd soft, nontender, positive bowel sounds MSK no focal spinal tenderness, no upper extremity lymphedema Neuro: nonfocal, well oriented, appropriate affect Skin: Sebaceous cyst and upper left back unchanged   LAB RESULTS:  CMP     Component Value Date/Time   NA 140 05/22/2016 0917   K 4.0 05/22/2016 0917   CL 102 02/08/2015 0705   CO2 27 05/22/2016 0917   GLUCOSE 102 05/22/2016 0917   BUN 22.6 05/22/2016 0917   CREATININE 1.1 05/22/2016 0917   CALCIUM 9.3 05/22/2016 0917   PROT 7.2 05/22/2016 0917    ALBUMIN 4.2 05/22/2016 0917   AST 22 05/22/2016 0917   ALT 23 05/22/2016 0917   ALKPHOS 39 (L) 05/22/2016 0917   BILITOT 0.50 05/22/2016 0917   GFRNONAA >60 02/08/2015 0705   GFRAA >60 02/08/2015 0705    INo results found for: SPEP, UPEP  Lab Results  Component Value Date   WBC 2.8 (L) 07/21/2016   NEUTROABS 1.1 (L) 07/21/2016   HGB 12.2 (L) 07/21/2016   HCT 37.9 (L) 07/21/2016   MCV 87.3 07/21/2016   PLT 306 07/21/2016      Chemistry      Component Value Date/Time   NA 140 05/22/2016 0917   K 4.0 05/22/2016 0917   CL 102 02/08/2015 0705   CO2 27 05/22/2016 0917   BUN 22.6 05/22/2016 0917   CREATININE 1.1 05/22/2016 0917      Component Value Date/Time   CALCIUM 9.3 05/22/2016 0917   ALKPHOS 39 (L) 05/22/2016 0917   AST 22 05/22/2016 0917   ALT 23 05/22/2016 0917   BILITOT 0.50 05/22/2016 0917       No results found for: LABCA2  No components found for: FXJOI325  No results for input(s): INR in the last 168 hours.  Urinalysis    Component Value Date/Time   COLORURINE YELLOW 01/23/2015 0805   APPEARANCEUR CLEAR 01/23/2015 0805   LABSPEC 1.014 01/23/2015 0805   PHURINE 7.5 01/23/2015 0805   GLUCOSEU NEGATIVE 01/23/2015 0805   HGBUR NEGATIVE 01/23/2015 0805   BILIRUBINUR NEGATIVE 01/23/2015 0805   KETONESUR NEGATIVE 01/23/2015 0805   PROTEINUR NEGATIVE 01/23/2015 0805   NITRITE NEGATIVE 01/23/2015 0805   LEUKOCYTESUR NEGATIVE 01/23/2015 0805    STUDIES: PET scan 05/29/2016 shows no evidence of disease activity.  ASSESSMENT: 66 y.o. [place] man with a diagnosis of mantle cell non-Hodgkin's lymphoma of extranodal and solid organ sites presenting with syncope secondary to bleeding from a large ulcerated duodenal ulcer  (a) s/p coil embolization of the feeding (gastro-duodenal) artery 01/23/2015  (b) anemia--scant iron stores on bone marrow biopsy--s/p feraheme 02/13/2015  (1) right inguinal lymph node biopsy 01/22/2015 confirms mantle cell  lymphoma  (a) bone marrow biopsy 02/06/2015 positive for involvement by the patient's mantle cell lymphoma  (b) IPI score of 5 (high risk) predicts a 5 year progression free survival of 50% with CHOP-Rituxan chemotherapy  (c) MIPI score of 5 (intermediate risk) predicts a median survival of 58 months  (2) CHOP/Rituxan started 02/09/2015, completed 8 cycles 07/05/2015  (3)  [facility] consultation 04/06/2015. Patient opted against transplant consolidation  (4) rituximab maintenance started 08/16/2015, repeat every 2 months  (5) PSA increased noted July 2017, back to baseline by December 2017  PLAN: [name] is now a year out from his last chemotherapy, with no evidence of disease activity. This is very favorable.  He tolerates the rituximab well. The only concern is dropping counts. This  may lead [place] to hold treatment at some point but not yet.  He [name] be treated again in August assuming his counts hold and then again in October. He [name] see me with with the October visit and just before that he [name] have a restaging PET scan.  He knows to call for any problems that may develop before that    Troy C  07/21/16

## 2016-07-21 NOTE — Patient Instructions (Signed)
[facility] Discharge Instructions for Patients Receiving Chemotherapy  Today you received the following chemotherapy agents: Rituxan   To help prevent nausea and vomiting after your treatment, we encourage you to take your nausea medication as directed.    If you develop nausea and vomiting that is not controlled by your nausea medication, call the clinic.   BELOW ARE SYMPTOMS THAT SHOULD BE REPORTED IMMEDIATELY:  *FEVER GREATER THAN 100.5 F  *CHILLS WITH OR WITHOUT FEVER  NAUSEA AND VOMITING THAT IS NOT CONTROLLED WITH YOUR NAUSEA MEDICATION  *UNUSUAL SHORTNESS OF BREATH  *UNUSUAL BRUISING OR BLEEDING  TENDERNESS IN MOUTH AND THROAT WITH OR WITHOUT PRESENCE OF ULCERS  *URINARY PROBLEMS  *BOWEL PROBLEMS  UNUSUAL RASH Items with * indicate a potential emergency and should be followed up as soon as possible.  Feel free to call the clinic you have any questions or concerns. The clinic phone number is [phone].  Please show the CHEMO ALERT CARD at check-in to the Emergency Department and triage nurse.   

## 2016-07-22 LAB — PSA: PROSTATE SPECIFIC AG, SERUM: 3.1 ng/mL (ref 0.0–4.0)

## 2016-08-05 ENCOUNTER — Other Ambulatory Visit: Payer: Self-pay | Admitting: *Deleted

## 2016-08-05 DIAGNOSIS — E785 Hyperlipidemia, unspecified: Secondary | ICD-10-CM

## 2016-08-05 MED ORDER — LOVASTATIN 20 MG PO TABS: 20.0000 mg | ORAL_TABLET | Freq: Every day | ORAL | 0 refills | Status: DC

## 2016-08-05 MED ORDER — LISINOPRIL-HYDROCHLOROTHIAZIDE 20-25 MG PO TABS: 1.0000 | ORAL_TABLET | Freq: Every day | ORAL | 1 refills | Status: DC

## 2016-09-17 ENCOUNTER — Telehealth: Payer: Self-pay | Admitting: Oncology

## 2016-09-17 ENCOUNTER — Telehealth: Payer: Self-pay

## 2016-09-17 NOTE — Telephone Encounter (Signed)
Pt called to cancel  8/17 appts and reschedule for 8/24.  msg sent to schedulers to call pt with new appt time for 8/24

## 2016-09-17 NOTE — Telephone Encounter (Signed)
lvm to inform pt of r/s 8/17 appts to 8/24 at 0800 per sch msg

## 2016-09-19 ENCOUNTER — Ambulatory Visit: Payer: BLUE CROSS/BLUE SHIELD

## 2016-09-19 ENCOUNTER — Other Ambulatory Visit: Payer: BLUE CROSS/BLUE SHIELD

## 2016-09-22 ENCOUNTER — Telehealth: Payer: Self-pay

## 2016-09-22 NOTE — Telephone Encounter (Signed)
Pt lvm to cancel 08/24 appts and reschedule for 08/31.  In basket msg sent to schedulers by this RN.

## 2016-09-23 ENCOUNTER — Telehealth: Payer: Self-pay | Admitting: Oncology

## 2016-09-23 NOTE — Telephone Encounter (Signed)
lvm to inform pt of r/s appt to 8/31 at 1230 per sch msg

## 2016-09-26 ENCOUNTER — Other Ambulatory Visit: Payer: BLUE CROSS/BLUE SHIELD

## 2016-09-26 ENCOUNTER — Ambulatory Visit: Payer: BLUE CROSS/BLUE SHIELD

## 2016-10-03 ENCOUNTER — Other Ambulatory Visit (HOSPITAL_BASED_OUTPATIENT_CLINIC_OR_DEPARTMENT_OTHER): Payer: BLUE CROSS/BLUE SHIELD

## 2016-10-03 ENCOUNTER — Ambulatory Visit (HOSPITAL_BASED_OUTPATIENT_CLINIC_OR_DEPARTMENT_OTHER): Payer: BLUE CROSS/BLUE SHIELD

## 2016-10-03 VITALS — BP 143/75 | HR 61 | Temp 97.2°F | Resp 17

## 2016-10-03 DIAGNOSIS — C8315 Mantle cell lymphoma, lymph nodes of inguinal region and lower limb: Secondary | ICD-10-CM

## 2016-10-03 DIAGNOSIS — Z5112 Encounter for antineoplastic immunotherapy: Secondary | ICD-10-CM | POA: Diagnosis not present

## 2016-10-03 DIAGNOSIS — C8319 Mantle cell lymphoma, extranodal and solid organ sites: Secondary | ICD-10-CM

## 2016-10-03 LAB — COMPREHENSIVE METABOLIC PANEL
ALT: 27 U/L (ref 0–55)
ANION GAP: 10 meq/L (ref 3–11)
AST: 22 U/L (ref 5–34)
Albumin: 4.1 g/dL (ref 3.5–5.0)
Alkaline Phosphatase: 48 U/L (ref 40–150)
BUN: 29.5 mg/dL — ABNORMAL HIGH (ref 7.0–26.0)
CALCIUM: 9.7 mg/dL (ref 8.4–10.4)
CHLORIDE: 101 meq/L (ref 98–109)
CO2: 28 meq/L (ref 22–29)
Creatinine: 1.4 mg/dL — ABNORMAL HIGH (ref 0.7–1.3)
EGFR: 63 mL/min/{1.73_m2} — AB (ref >90)
Glucose: 87 mg/dl (ref 70–140)
POTASSIUM: 4.3 meq/L (ref 3.5–5.1)
Sodium: 138 mEq/L (ref 136–145)
Total Bilirubin: 0.38 mg/dL (ref 0.20–1.20)
Total Protein: 7.4 g/dL (ref 6.4–8.3)

## 2016-10-03 LAB — CBC WITH DIFFERENTIAL/PLATELET
BASO%: 0.7 % (ref 0.0–2.0)
BASOS ABS: 0 10*3/uL (ref 0.0–0.1)
EOS%: 2.1 % (ref 0.0–7.0)
Eosinophils Absolute: 0.1 10*3/uL (ref 0.0–0.5)
HEMATOCRIT: 37.2 % — AB (ref 38.4–49.9)
HEMOGLOBIN: 11.9 g/dL — AB (ref 13.0–17.1)
LYMPH#: 1 10*3/uL (ref 0.9–3.3)
LYMPH%: 34.7 % (ref 14.0–49.0)
MCH: 27.7 pg (ref 27.2–33.4)
MCHC: 32 g/dL (ref 32.0–36.0)
MCV: 86.7 fL (ref 79.3–98.0)
MONO#: 0.4 10*3/uL (ref 0.1–0.9)
MONO%: 12.7 % (ref 0.0–14.0)
NEUT#: 1.5 10*3/uL (ref 1.5–6.5)
NEUT%: 49.8 % (ref 39.0–75.0)
PLATELETS: 279 10*3/uL (ref 140–400)
RBC: 4.29 10*6/uL (ref 4.20–5.82)
RDW: 14.1 % (ref 11.0–14.6)
WBC: 2.9 10*3/uL — ABNORMAL LOW (ref 4.0–10.3)

## 2016-10-03 LAB — LACTATE DEHYDROGENASE: LDH: 222 U/L (ref 125–245)

## 2016-10-03 MED ORDER — DIPHENHYDRAMINE HCL 25 MG PO CAPS
ORAL_CAPSULE | ORAL | Status: AC
  Filled 2016-10-03: qty 1

## 2016-10-03 MED ORDER — DIPHENHYDRAMINE HCL 25 MG PO CAPS
25.0000 mg | ORAL_CAPSULE | Freq: Once | ORAL | Status: AC
  Administered 2016-10-03: 25 mg via ORAL

## 2016-10-03 MED ORDER — ACETAMINOPHEN 325 MG PO TABS
650.0000 mg | ORAL_TABLET | Freq: Once | ORAL | Status: AC
  Administered 2016-10-03: 650 mg via ORAL

## 2016-10-03 MED ORDER — SODIUM CHLORIDE 0.9 % IV SOLN
Freq: Once | INTRAVENOUS | Status: AC
  Administered 2016-10-03: 14:00:00 via INTRAVENOUS

## 2016-10-03 MED ORDER — SODIUM CHLORIDE 0.9% FLUSH
10.0000 mL | INTRAVENOUS | Status: DC | PRN
  Administered 2016-10-03: 10 mL
  Filled 2016-10-03: qty 10

## 2016-10-03 MED ORDER — SODIUM CHLORIDE 0.9 % IV SOLN
375.0000 mg/m2 | Freq: Once | INTRAVENOUS | Status: AC
  Administered 2016-10-03: 800 mg via INTRAVENOUS
  Filled 2016-10-03: qty 50

## 2016-10-03 MED ORDER — HEPARIN SOD (PORK) LOCK FLUSH 100 UNIT/ML IV SOLN
500.0000 [IU] | Freq: Once | INTRAVENOUS | Status: AC | PRN
  Administered 2016-10-03: 500 [IU]
  Filled 2016-10-03: qty 5

## 2016-10-03 MED ORDER — ACETAMINOPHEN 325 MG PO TABS
ORAL_TABLET | ORAL | Status: AC
  Filled 2016-10-03: qty 2

## 2016-10-03 NOTE — Patient Instructions (Signed)
[facility] Discharge Instructions for Patients Receiving Chemotherapy  Today you received the following chemotherapy agents: Rituxan   To help prevent nausea and vomiting after your treatment, we encourage you to take your nausea medication as directed.    If you develop nausea and vomiting that is not controlled by your nausea medication, call the clinic.   BELOW ARE SYMPTOMS THAT SHOULD BE REPORTED IMMEDIATELY:  *FEVER GREATER THAN 100.5 F  *CHILLS WITH OR WITHOUT FEVER  NAUSEA AND VOMITING THAT IS NOT CONTROLLED WITH YOUR NAUSEA MEDICATION  *UNUSUAL SHORTNESS OF BREATH  *UNUSUAL BRUISING OR BLEEDING  TENDERNESS IN MOUTH AND THROAT WITH OR WITHOUT PRESENCE OF ULCERS  *URINARY PROBLEMS  *BOWEL PROBLEMS  UNUSUAL RASH Items with * indicate a potential emergency and should be followed up as soon as possible.  Feel free to call the clinic you have any questions or concerns. The clinic phone number is [phone].  Please show the CHEMO ALERT CARD at check-in to the Emergency Department and triage nurse.   

## 2016-11-04 ENCOUNTER — Other Ambulatory Visit: Payer: Self-pay | Admitting: Oncology

## 2016-11-04 DIAGNOSIS — E785 Hyperlipidemia, unspecified: Secondary | ICD-10-CM

## 2016-11-05 ENCOUNTER — Other Ambulatory Visit: Payer: Self-pay

## 2016-11-05 DIAGNOSIS — C8319 Mantle cell lymphoma, extranodal and solid organ sites: Secondary | ICD-10-CM

## 2016-11-14 ENCOUNTER — Other Ambulatory Visit: Payer: Self-pay | Admitting: Oncology

## 2016-11-14 ENCOUNTER — Other Ambulatory Visit: Payer: Self-pay

## 2016-11-14 DIAGNOSIS — C8319 Mantle cell lymphoma, extranodal and solid organ sites: Secondary | ICD-10-CM

## 2016-11-14 NOTE — Progress Notes (Signed)
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 He "blew this off" and it did not happen again until 01/17/2015 when he felt weak at work and later fainted at home. He was brought to the ED where he was found to be guaiac positive (denies melena before that date) with a Hb of 4.5 MCV 82.2, platelets 295K, WBC 9.1 and normal INR and PTT. He was admitted and underwent EGD under Dr Michail Sermon 01/18/2015 showing a very large duodenal ulcer w clot. Abdominal US 01/19/2015 was not very informative but CT abd/pelvis 12/17,2016 showed adenopathy in the anterior mediastinum, retroperitoneum and pelvis, with a large ulcerated mass involving the distal stomach/proximal duodenum and extending into the gastrohepatic region.   On 01/22/2015 he underwent right inguinal lymph node biopsy, consistent with mantle cell lymphoma (([id number] and [id number])). Specifically the lymph node showed effacement of the architecture by sheets of small to medium sized lymphocytes with no apparent nodularity. The cells were positive for CD20, CD5, BCL-2, CD21, and cyclin D1. He 67 was low  (10-20%). CD21 revealed a few residual germinal centers area at the B cells were negative for CD10, and BCL-6. CD23 was negative.  On 02/06/2015 the patient underwent a bone marrow aspirate and biopsy. The pathology from this procedure (FZB 17-2 and 17-7) was positive for involvement by non-Hodgkin's lymphoma with numerous atypical interstitial and paratrabecular lymphoid aggregates consistent with mantle cell lymphoma. Iron stain showed scant iron flow cytometry confirmed a monoclonal B-cell population, lambda restricted, consistent with the patient's known diagnosis of mantle cell lymphoma.  On 01/18/2015 the patient underwent EGD which found a massive duodenal bulb ulcer with adherent clot, although no active bleeding.his hemoglobin was in the 8.4 range and stable. He was discharged on 01/22/2015, but within 24 hours was readmitted with syncope and altered mental status. He had aspirated and required intubation for his respiratory failure. Hemoglobin was down to 6.3. An NG tube was placed, the patient was multiply transfused, and Surgery was considered but felt not to be feasible.accordingly the patient underwent arteriography 01/23/2015 which found the superior mesenteric and celiac arteries not to be the involved vessels. The gastroduodenal artery showed multiple branches providing supply to the hypervascular duodenal mass, and this artery was coil embolized.. Also a port was placed at the time of this procedure with a view to eventual chemotherapy.  He was discharged 01/26/2015,but required readmission 02/07/2015 for another presyncopal episode. He presented to the emergency room where he was found to have a hemoglobin of 7.5. The patient was transfusedIIA hemoglobin of 10 and was discharged 02/07/2014. He was scheduled to start CHOP right toxin 02/08/2014.  His subsequent history is as detailed below.  INTERVAL HISTORY: [name] returns today for follow-up and treatment of his mantle cell lymphoma.  Clinically he is doing great. They have been no fevers, rash, pruritus, adenopathy, drenching sweats, weight loss, or fatigue. He continues to exercise regularly, doing a lot of weight, and of course he works full time.  However his PET scan just obtained shows new it is title adenopathy which is "hot" on scan. In the absence of any intercurrent illnesses is consistent with disease recurrence.   REVIEW OF SYSTEMS: [name] reports he is doing well. He continues to lift weights for physical activity. He denies unusual headaches, visual changes, nausea, vomiting, or dizziness. There has been no unusual cough, phlegm production, or pleurisy. This been no change in bowel or bladder habits. He denies unexplained fatigue or unexplained weight loss, bleeding, rash, or fever. A detailed review of systems was otherwise entirely negative.   PAST MEDICAL HISTORY: Past Medical History:  Diagnosis Date  . Anemia   . Cancer ([place])    Lymphoma  . Essential hypertension   . GERD (gastroesophageal reflux disease)   . GI bleed   . HLD (hyperlipidemia)   . Weakness     PAST SURGICAL HISTORY: Past Surgical History:  Procedure Laterality Date  . ESOPHAGOGASTRODUODENOSCOPY Left 01/18/2015   Procedure: ESOPHAGOGASTRODUODENOSCOPY (EGD);  Surgeon: Gary Corner, MD;  Location: [facility] ENDOSCOPY;  Service: Endoscopy;  Laterality: Left;  . INGUINAL HERNIA REPAIR Right 01/22/2015   Procedure: RIGHT INGUINAL LYMPH NODE BX;  Surgeon: Gary Mesa, MD;  Location: [facility];  Service: General;  Laterality: Right;  . PORTACATH PLACEMENT  01/26/2015    power port with tip SVC/RA Junction  . SKIN SURGERY     Small benign cysts over left scalp removed    FAMILY HISTORY Family History  Problem Relation Age of Onset  . Hypertension Mother   . Hypertension Father   . Hypertension Sister   . Diabetes Sister   . Prostate cancer Brother   . Lupus Sister   . Kidney failure Father    The patient's father died from CHF  complications age 41. The patient's mother is 23 y/o as of December 2016. The patient has 9 brothers, 8 sisters. One brother has prostate cancer. There is no other cancer history in the family to his knowledge.  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 His wife of 57 years, [name], is disabled due to RA. Daughter, [name] works as a Teacher, early years/pre for [employer] (she previously worked at the [employer] in records). Son, [name] owns a window washing business. Son, [name] is in Press photographer at [employer]. All live in [place]. The patient has 3 grandchildren. He attends a [name].    ADVANCED DIRECTIVES: Not in place   HEALTH MAINTENANCE: Social History  Substance Use Topics  . Smoking status: Never Smoker  . Smokeless tobacco: Never Used  . Alcohol use Yes     Comment: socially     Colonoscopy:  PSA: 2.27 on 01/22/2015 When the patient Dr. Hepatitis serologies (be surface antigen, hepatitis B and hepatitis see IgM) negative 01/18/2015  No Known Allergies  Current Outpatient Prescriptions  Medication Sig Dispense Refill  . diazepam (VALIUM) 5 MG tablet Take 1 tab 1 hour before scan, may repeat x1 5 tablet 0  . lisinopril-hydrochlorothiazide (PRINZIDE,ZESTORETIC) 20-25 MG tablet Take 1 tablet by mouth daily. 90 tablet 1  . lovastatin (MEVACOR) 20 MG tablet TAKE 1 TABLET BY MOUTH EVERYDAY AT BEDTIME 90 tablet 0   No current facility-administered medications for this visit.  Facility-Administered Medications Ordered in Other Visits  Medication Dose Route Frequency Provider Last Rate Last Dose  . sodium chloride flush (NS) 0.9 % injection 10 mL  10 mL Intracatheter PRN [name], Gary Dad, MD   10 mL at 11/18/16 1354    OBJECTIVE: Middle-aged African-American man Who appears well  Vitals:   11/18/16 0930  BP: 123/70  Pulse: 71  Resp: 18  Temp: 97.9 F (36.6 C)  SpO2: 100%     Body mass index is 30.12 kg/m.    ECOG  FS:0 - Asymptomatic  Sclerae unicteric, EOMs intact Oropharynx clear and moist No cervical or supraclavicular adenopathy, no axillary or inguinal adenopathy Lungs no rales or rhonchi Heart regular rate and rhythm Abd soft, nontender, positive bowel sounds MSK no focal spinal tenderness, no upper extremity lymphedema Neuro: nonfocal, well oriented, appropriate affect Skin: Large cyst in the upper left back, stable  LAB RESULTS:  CMP     Component Value Date/Time   NA 139 11/18/2016 0858   K 4.1 11/18/2016 0858   CL 102 02/08/2015 0705   CO2 27 11/18/2016 0858   GLUCOSE 100 11/18/2016 0858   BUN 17.3 11/18/2016 0858   CREATININE 1.1 11/18/2016 0858   CALCIUM 9.4 11/18/2016 0858   PROT 7.3 11/18/2016 0858   ALBUMIN 3.8 11/18/2016 0858   AST 15 11/18/2016 0858   ALT 16 11/18/2016 0858   ALKPHOS 55 11/18/2016 0858   BILITOT 0.24 11/18/2016 0858   GFRNONAA >60 02/08/2015 0705   GFRAA >60 02/08/2015 0705    INo results found for: SPEP, UPEP  Lab Results  Component Value Date   WBC 4.7 11/18/2016   NEUTROABS 3.3 11/18/2016   HGB 11.8 (L) 11/18/2016   HCT 36.8 (L) 11/18/2016   MCV 87.2 11/18/2016   PLT 350 11/18/2016      Chemistry      Component Value Date/Time   NA 139 11/18/2016 0858   K 4.1 11/18/2016 0858   CL 102 02/08/2015 0705   CO2 27 11/18/2016 0858   BUN 17.3 11/18/2016 0858   CREATININE 1.1 11/18/2016 0858      Component Value Date/Time   CALCIUM 9.4 11/18/2016 0858   ALKPHOS 55 11/18/2016 0858   AST 15 11/18/2016 0858   ALT 16 11/18/2016 0858   BILITOT 0.24 11/18/2016 0858       No results found for: LABCA2  No components found for: LABCA125  No results for input(s): INR in the last 168 hours.  Urinalysis    Component Value Date/Time   COLORURINE YELLOW 01/23/2015 0805   APPEARANCEUR CLEAR 01/23/2015 0805   LABSPEC 1.014 01/23/2015 0805   PHURINE 7.5 01/23/2015 0805   GLUCOSEU NEGATIVE 01/23/2015 0805   HGBUR NEGATIVE 01/23/2015  0805   BILIRUBINUR NEGATIVE 01/23/2015 0805   KETONESUR NEGATIVE 01/23/2015 0805   PROTEINUR NEGATIVE 01/23/2015 0805   NITRITE NEGATIVE 01/23/2015 0805   LEUKOCYTESUR NEGATIVE 01/23/2015 0805    STUDIES: Nm Pet Image Restag (ps) Skull Base To Thigh  Result Date: 11/17/2016 CLINICAL DATA:  Subsequent treatment strategy for mantle cell lymphoma. Restaging. EXAM: NUCLEAR MEDICINE PET SKULL BASE TO THIGH TECHNIQUE: 10.5 mCi F-18 FDG was injected intravenously. Full-ring PET imaging was performed from the skull base to thigh after the radiotracer. CT data was obtained and used for attenuation correction and anatomic localization. FASTING BLOOD GLUCOSE:  Value: 67 mg/dl COMPARISON:  05/29/2016 FINDINGS: NECK: Hypermetabolism about the true cords is likely physiologic. No cervical adenopathy. Small low right jugular nodes are  not significantly changed. Left carotid atherosclerosis. CHEST: Development of hypermetabolic thoracic nodes. A right hilar focus of hypermetabolism measures a S.U.V. max of 13.2, including on image 65/series 4. Left mediastinal nodal tissue measures a S.U.V. max of 14.8, including on approximately image 71/series 4. There is also left hilar nodal hypermetabolism. Mild cardiomegaly with right and lad coronary artery atherosclerosis. A right-sided Port-A-Cath which terminates at the high right atrium. ABDOMEN/PELVIS: No abdominopelvic nodal or parenchymal hypermetabolism. Normal adrenal glands. Surgical clips in the region of the descending duodenum and pancreatic head. Atherosclerosis within the aorta and at the origin of both renal arteries. Mild prostatomegaly. SKELETON: No abnormal marrow activity. Right greater the left sacroiliac joint degenerative partial fusion. IMPRESSION: 1. Hypermetabolism within thoracic nodal stations, most consistent with recurrent disease. 2. Coronary artery atherosclerosis. Aortic Atherosclerosis (ICD10-I70.0). Electronically Signed   By: Jeronimo Greaves M.D.    On: 11/17/2016 10:06    ASSESSMENT: 66 y.o. 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 organ sites presenting with syncope secondary to bleeding from a large ulcerated duodenal ulcer  (a) s/p coil embolization of the feeding (gastro-duodenal) artery 01/23/2015  (b) anemia--scant iron stores on bone marrow biopsy--s/p feraheme 02/13/2015  (1) right inguinal lymph node biopsy 01/22/2015 confirms mantle cell lymphoma  (a) bone marrow biopsy 02/06/2015 positive for involvement by the patient's mantle cell lymphoma  (b) IPI score of 5 (high risk) predicts a 5 year progression free survival of 50% with CHOP-Rituxan chemotherapy  (c) MIPI score of 5 (intermediate risk) predicts a median survival of 58 months  (2) CHOP/Rituxan started 02/09/2015, completed 8 cycles 07/05/2015  (3)  [facility] consultation 04/06/2015. Patient opted against transplant consolidation  (4) rituximab maintenance started 08/16/2015, repeat every 2 months  (5) PSA increased noted July 2017, back to baseline by December 2017  PLAN: [name] is now 16 months out from his last chemotherapy dose. He has been on rituximab maintenance with excellent tolerance.  Unfortunately his PET scan now shows evidence of disease recurrence.  We reviewed his diagnosis and prognosis. He understands that mantle cell lymphoma does tend to recur. He previously met with Dr. Lucretia Roers at [facility] and consider transplant but opted for maintenance instead.  At this point, he is willing to consider transplantation as appropriate. He understands that he [name] need treatment to anterior a second remission. I have contacted the [facility] group and I expect within the next week or so he [name] be evaluated there. We can treat him here or there at his and their discretion but I [name] be interested in their input.  If he does obtain a second complete remission I would strongly recommend that he proceed to transplant at that  time  [name] has a good understanding of this plan. He [name] let me know if he has not heard from [facility] in the next few days. Otherwise I have made him a return appointment here in about 3 weeks so we can operationalize any plans that we need to put in place  Vina Byrd, Valentino Hue, MD  11/18/16 2:39 PM Medical Oncology and Hematology [facility] [address] [place] , [place] [address] Tel. [phone]    Fax. [phone]  This document serves as a record of services personally performed by Lowella Dell, MD. It was created on her behalf by Diona Browner, a trained medical scribe. The creation of this record is based on the scribe's personal observations and the provider's statements to them. This document  has been checked and approved by the attending provider.

## 2016-11-15 ENCOUNTER — Encounter (HOSPITAL_COMMUNITY): Payer: BLUE CROSS/BLUE SHIELD

## 2016-11-15 ENCOUNTER — Encounter (HOSPITAL_COMMUNITY)
Admission: RE | Admit: 2016-11-15 | Discharge: 2016-11-15 | Disposition: A | Discharge status: Home / Self Care | Payer: BLUE CROSS/BLUE SHIELD | Source: Ambulatory Visit | Attending: Oncology | Admitting: Oncology

## 2016-11-15 DIAGNOSIS — C8319 Mantle cell lymphoma, extranodal and solid organ sites: Secondary | ICD-10-CM | POA: Insufficient documentation

## 2016-11-15 LAB — GLUCOSE, CAPILLARY: GLUCOSE-CAPILLARY: 67 mg/dL (ref 65–99)

## 2016-11-15 IMAGING — PT NM PET TUM IMG RESTAG (PS) SKULL BASE T - THIGH
1 of 8 series · 1 of 25 positions shown · non-contrast
Comparison: [DATE]

CLINICAL DATA: Subsequent treatment strategy for mantle cell
lymphoma. Restaging..

EXAM:
NUCLEAR MEDICINE PET SKULL BASE TO THIGH
TECHNIQUE: 10.5 mCi F-18 FDG was injected intravenously. Full-ring PET imaging
was performed from the skull base to thigh after the radiotracer. CT
data was obtained and used for attenuation correction and anatomic
localization.
FASTING BLOOD GLUCOSE:  Value: 67 mg/dl

[Series 3: pet sk_thigh ac · axial · 5.0mm · 4.07mm/px · 1 of 226 slices shown]
[im 113/226]
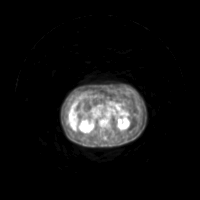

[1 of 25 positions shown; findings below may reference images not displayed]

FINDINGS: NECK: Hypermetabolism about the true cords is likely physiologic. No
cervical adenopathy. Small low right jugular nodes are not
significantly changed. Left carotid atherosclerosis.

CHEST: Development of hypermetabolic thoracic nodes. A right hilar
focus of hypermetabolism measures a S.U.V. max of 13.2, including on
image 65/series 4. Left mediastinal nodal tissue measures a S.U.V.
max of 14.8, including on approximately image 71/series 4. There is
also left hilar nodal hypermetabolism.

Mild cardiomegaly with right and lad coronary artery
atherosclerosis. A right-sided Port-A-Cath which terminates at the
high right atrium.

ABDOMEN/PELVIS: No abdominopelvic nodal or parenchymal
hypermetabolism. Normal adrenal glands. Surgical clips in the region
of the descending duodenum and pancreatic head. Atherosclerosis
within the aorta and at the origin of both renal arteries. Mild
prostatomegaly.

SKELETON: No abnormal marrow activity. Right greater the left
sacroiliac joint degenerative partial fusion.
IMPRESSION: 1. Hypermetabolism within thoracic nodal stations, most consistent
with recurrent disease.
2. Coronary artery atherosclerosis. Aortic Atherosclerosis
([99]-[99]).

## 2016-11-18 ENCOUNTER — Other Ambulatory Visit (HOSPITAL_BASED_OUTPATIENT_CLINIC_OR_DEPARTMENT_OTHER): Payer: BLUE CROSS/BLUE SHIELD

## 2016-11-18 ENCOUNTER — Ambulatory Visit (HOSPITAL_BASED_OUTPATIENT_CLINIC_OR_DEPARTMENT_OTHER): Payer: BLUE CROSS/BLUE SHIELD | Admitting: Oncology

## 2016-11-18 ENCOUNTER — Other Ambulatory Visit: Payer: Self-pay | Admitting: Oncology

## 2016-11-18 ENCOUNTER — Telehealth: Payer: Self-pay | Admitting: Oncology

## 2016-11-18 ENCOUNTER — Ambulatory Visit (HOSPITAL_BASED_OUTPATIENT_CLINIC_OR_DEPARTMENT_OTHER): Payer: BLUE CROSS/BLUE SHIELD

## 2016-11-18 VITALS — BP 109/84 | HR 65 | Temp 97.8°F | Resp 16

## 2016-11-18 VITALS — BP 123/70 | HR 71 | Temp 97.9°F | Resp 18 | Ht 70.0 in | Wt 209.9 lb

## 2016-11-18 DIAGNOSIS — C8312 Mantle cell lymphoma, intrathoracic lymph nodes: Secondary | ICD-10-CM | POA: Diagnosis not present

## 2016-11-18 DIAGNOSIS — C8319 Mantle cell lymphoma, extranodal and solid organ sites: Secondary | ICD-10-CM

## 2016-11-18 DIAGNOSIS — Z5112 Encounter for antineoplastic immunotherapy: Secondary | ICD-10-CM | POA: Diagnosis not present

## 2016-11-18 LAB — CBC WITH DIFFERENTIAL/PLATELET
BASO%: 0.2 % (ref 0.0–2.0)
Basophils Absolute: 0 10*3/uL (ref 0.0–0.1)
EOS ABS: 0.1 10*3/uL (ref 0.0–0.5)
EOS%: 1.9 % (ref 0.0–7.0)
HEMATOCRIT: 36.8 % — AB (ref 38.4–49.9)
HGB: 11.8 g/dL — ABNORMAL LOW (ref 13.0–17.1)
LYMPH#: 1 10*3/uL (ref 0.9–3.3)
LYMPH%: 21.2 % (ref 14.0–49.0)
MCH: 28 pg (ref 27.2–33.4)
MCHC: 32.1 g/dL (ref 32.0–36.0)
MCV: 87.2 fL (ref 79.3–98.0)
MONO#: 0.3 10*3/uL (ref 0.1–0.9)
MONO%: 6.2 % (ref 0.0–14.0)
NEUT%: 70.5 % (ref 39.0–75.0)
NEUTROS ABS: 3.3 10*3/uL (ref 1.5–6.5)
PLATELETS: 350 10*3/uL (ref 140–400)
RBC: 4.22 10*6/uL (ref 4.20–5.82)
RDW: 13.6 % (ref 11.0–14.6)
WBC: 4.7 10*3/uL (ref 4.0–10.3)

## 2016-11-18 LAB — COMPREHENSIVE METABOLIC PANEL
ALK PHOS: 55 U/L (ref 40–150)
ALT: 16 U/L (ref 0–55)
ANION GAP: 11 meq/L (ref 3–11)
AST: 15 U/L (ref 5–34)
Albumin: 3.8 g/dL (ref 3.5–5.0)
BILIRUBIN TOTAL: 0.24 mg/dL (ref 0.20–1.20)
BUN: 17.3 mg/dL (ref 7.0–26.0)
CALCIUM: 9.4 mg/dL (ref 8.4–10.4)
CO2: 27 mEq/L (ref 22–29)
CREATININE: 1.1 mg/dL (ref 0.7–1.3)
Chloride: 101 mEq/L (ref 98–109)
Glucose: 100 mg/dl (ref 70–140)
Potassium: 4.1 mEq/L (ref 3.5–5.1)
Sodium: 139 mEq/L (ref 136–145)
TOTAL PROTEIN: 7.3 g/dL (ref 6.4–8.3)

## 2016-11-18 LAB — LACTATE DEHYDROGENASE: LDH: 193 U/L (ref 125–245)

## 2016-11-18 MED ORDER — DIPHENHYDRAMINE HCL 25 MG PO CAPS
25.0000 mg | ORAL_CAPSULE | Freq: Once | ORAL | Status: AC
  Administered 2016-11-18: 25 mg via ORAL

## 2016-11-18 MED ORDER — RITUXIMAB CHEMO INJECTION 500 MG/50ML
375.0000 mg/m2 | Freq: Once | INTRAVENOUS | Status: AC
  Administered 2016-11-18: 800 mg via INTRAVENOUS
  Filled 2016-11-18: qty 50

## 2016-11-18 MED ORDER — SODIUM CHLORIDE 0.9 % IV SOLN
Freq: Once | INTRAVENOUS | Status: AC
  Administered 2016-11-18: 11:00:00 via INTRAVENOUS

## 2016-11-18 MED ORDER — DIPHENHYDRAMINE HCL 25 MG PO CAPS
ORAL_CAPSULE | ORAL | Status: AC
  Filled 2016-11-18: qty 1

## 2016-11-18 MED ORDER — HEPARIN SOD (PORK) LOCK FLUSH 100 UNIT/ML IV SOLN
500.0000 [IU] | Freq: Once | INTRAVENOUS | Status: AC | PRN
  Administered 2016-11-18: 500 [IU]
  Filled 2016-11-18: qty 5

## 2016-11-18 MED ORDER — SODIUM CHLORIDE 0.9% FLUSH
10.0000 mL | INTRAVENOUS | Status: DC | PRN
  Administered 2016-11-18: 10 mL
  Filled 2016-11-18: qty 10

## 2016-11-18 MED ORDER — ACETAMINOPHEN 325 MG PO TABS
ORAL_TABLET | ORAL | Status: AC
  Filled 2016-11-18: qty 2

## 2016-11-18 MED ORDER — ACETAMINOPHEN 325 MG PO TABS
650.0000 mg | ORAL_TABLET | Freq: Once | ORAL | Status: AC
  Administered 2016-11-18: 650 mg via ORAL

## 2016-11-18 NOTE — Patient Instructions (Signed)
[facility] Discharge Instructions for Patients Receiving Chemotherapy  Today you received the following chemotherapy agents:  Rituxan.  To help prevent nausea and vomiting after your treatment, we encourage you to take your nausea medication as directed.   If you develop nausea and vomiting that is not controlled by your nausea medication, call the clinic.   BELOW ARE SYMPTOMS THAT SHOULD BE REPORTED IMMEDIATELY:  *FEVER GREATER THAN 100.5 F  *CHILLS WITH OR WITHOUT FEVER  NAUSEA AND VOMITING THAT IS NOT CONTROLLED WITH YOUR NAUSEA MEDICATION  *UNUSUAL SHORTNESS OF BREATH  *UNUSUAL BRUISING OR BLEEDING  TENDERNESS IN MOUTH AND THROAT WITH OR WITHOUT PRESENCE OF ULCERS  *URINARY PROBLEMS  *BOWEL PROBLEMS  UNUSUAL RASH Items with * indicate a potential emergency and should be followed up as soon as possible.  Feel free to call the clinic should you have any questions or concerns. The clinic phone number is [phone].  Please show the CHEMO ALERT CARD at check-in to the Emergency Department and triage nurse.   

## 2016-11-18 NOTE — Telephone Encounter (Signed)
Scheduled appt per 10/16 los - patient is aware of appt time and date,

## 2016-11-19 ENCOUNTER — Other Ambulatory Visit: Payer: Self-pay | Admitting: Oncology

## 2016-11-19 LAB — PSA: PROSTATE SPECIFIC AG, SERUM: 2.1 ng/mL (ref 0.0–4.0)

## 2016-11-25 ENCOUNTER — Other Ambulatory Visit: Payer: Self-pay | Admitting: *Deleted

## 2016-11-26 ENCOUNTER — Telehealth: Payer: Self-pay | Admitting: *Deleted

## 2016-11-26 NOTE — Telephone Encounter (Signed)
This RN faxed records to [facility] per MD contact regarding referral.  Fax number of [phone].

## 2016-11-30 ENCOUNTER — Other Ambulatory Visit: Payer: Self-pay | Admitting: Oncology

## 2016-11-30 NOTE — Progress Notes (Unsigned)
[name] appointment at [facility] is November 7.  Accordingly I have canceled his visit here November 2 and he will see me instead November 16.  He is aware of this.

## 2016-12-03 ENCOUNTER — Telehealth: Payer: Self-pay | Admitting: Oncology

## 2016-12-03 NOTE — Telephone Encounter (Signed)
Left message for patient regarding updates to November appointments per 10/28 sch message.

## 2016-12-05 ENCOUNTER — Ambulatory Visit: Payer: BLUE CROSS/BLUE SHIELD | Admitting: Oncology

## 2016-12-18 NOTE — Progress Notes (Signed)
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 He "blew this off" and it did not happen again until 01/17/2015 when he felt weak at work and later fainted at home. He was brought to the ED where he was found to be guaiac positive (denies melena before that date) with a Hb of 4.5 MCV 82.2, platelets 295K, WBC 9.1 and normal INR and PTT. He was admitted and underwent EGD under Dr [name] 01/18/2015 showing a very large duodenal ulcer w clot. Abdominal US 01/19/2015 was not very informative but CT abd/pelvis 12/17,2016 showed adenopathy in the anterior mediastinum, retroperitoneum and pelvis, with a large ulcerated mass involving the distal stomach/proximal duodenum and extending into the gastrohepatic region.   On 01/22/2015 he underwent right inguinal lymph node biopsy, consistent with mantle cell lymphoma (([id number] and [id number])). Specifically the lymph node showed effacement of the architecture by sheets of small to medium sized lymphocytes with no apparent nodularity. The cells were positive for CD20, CD5, BCL-2, CD21, and cyclin D1. He 67 was low  (10-20%). CD21 revealed a few residual germinal centers area at the B cells were negative for CD10, and BCL-6. CD23 was negative.  On 02/06/2015 the patient underwent a bone marrow aspirate and biopsy. The pathology from this procedure (FZB 17-2 and 17-7) was positive for involvement by non-Hodgkin's lymphoma with numerous atypical interstitial and paratrabecular lymphoid aggregates consistent with mantle cell lymphoma. Iron stain showed scant iron flow cytometry confirmed a monoclonal B-cell population, lambda restricted, consistent with the patient's known diagnosis of mantle cell lymphoma.  On 01/18/2015 the patient underwent EGD which found a massive duodenal bulb ulcer with adherent clot, although no active bleeding.his hemoglobin was in the 8.4 range and stable. He was discharged on 01/22/2015, but within 24 hours was readmitted with syncope and altered mental status. He had aspirated and required intubation for his respiratory failure. Hemoglobin was down to 6.3. An NG tube was placed, the patient was multiply transfused, and Surgery was considered but felt not to be feasible.accordingly the patient underwent arteriography 01/23/2015 which found the superior mesenteric and celiac arteries not to be the involved vessels. The gastroduodenal artery showed multiple branches providing supply to the hypervascular duodenal mass, and this artery was coil embolized.. Also a port was placed at the time of this procedure with a view to eventual chemotherapy.  He was discharged 01/26/2015,but required readmission 02/07/2015 for another presyncopal episode. He presented to the emergency room where he was found to have a hemoglobin of 7.5. The patient was transfusedIIA hemoglobin of 10 and was discharged 02/07/2014. He was scheduled to start CHOP right toxin 02/08/2014.  His subsequent history is as detailed below.  INTERVAL HISTORY: [name] returns today for follow-up and treatment of his mantle cell lymphoma.  He  is accompanied by his wife. He states that he is doing well overall. He went to [name] and had a discussion regarding his situation.  I am copying Dr. Charlott Taylor comments below.  At this point there is no plan to proceed to transplantation.  REVIEW OF SYSTEMS: [name] reports that he continues to work. He denies "B" symptoms.  He denies unusual headaches, visual changes, nausea, vomiting, or dizziness. There has been no unusual cough, phlegm production, or pleurisy. This been no change in bowel or bladder habits. She denies unexplained fatigue or unexplained weight loss, bleeding, rash, or fever. A detailed review of systems was otherwise stable.      PAST MEDICAL HISTORY: Past Medical History:  Diagnosis Date  . Anemia   . Cancer ([place])    Lymphoma  . Essential hypertension   . GERD (gastroesophageal reflux disease)   . GI bleed   . HLD (hyperlipidemia)   . Weakness     PAST SURGICAL HISTORY: Past Surgical History:  Procedure Laterality Date  . ESOPHAGOGASTRODUODENOSCOPY (EGD) Left 01/18/2015   Performed by Gary Corner, Taylor at [facility]  . PORTACATH PLACEMENT  01/26/2015    power port with tip SVC/RA [place]unction  . RIGHT INGUINAL LYMPH NODE BX Right 01/22/2015   Performed by Gary Mesa, Taylor at [facility]  . SKIN SURGERY     Small benign cysts over left scalp removed    FAMILY HISTORY Family History  Problem Relation Age of Onset  . Hypertension Mother   . Hypertension Father   . Hypertension Sister   . Diabetes Sister   . Prostate cancer Brother   . Lupus Sister   . Kidney failure Father    The patient's father died from CHF complications age 39. The patient's mother is 49 y/o as of December 2016. The patient has 9 brothers, 8 sisters. One brother has prostate cancer. There is no other cancer history in the family to his knowledge.  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 [name], [name], is disabled due to  RA. Daughter, [name] works as a Teacher, early years/pre for [employer] (she previously worked at the [employer] in records). Son, [name] owns a window washing business. Son, [name] is in Press photographer at [employer]. All live in [place]. The patient has 3 grandchildren. He attends a [employer].    ADVANCED DIRECTIVES: Not in place   HEALTH MAINTENANCE: Social History   Tobacco Use  . Smoking status: Never Smoker  . Smokeless tobacco: Never Used  Substance Use Topics  . Alcohol use: Yes    Comment: socially  . Drug use: No     Colonoscopy:  PSA: 2.27 on 01/22/2015 When the patient Dr. Hepatitis serologies (be surface antigen, hepatitis B and hepatitis see IgM) negative 01/18/2015  No Known Allergies  Current Outpatient Medications  Medication Sig Dispense Refill  . diazepam (VALIUM) 5 MG tablet Take 1 tab 1 hour before scan, may repeat x1 5 tablet 0  . lisinopril-hydrochlorothiazide (PRINZIDE,ZESTORETIC) 20-25 MG tablet Take 1 tablet by mouth daily. 90 tablet 1  . lovastatin (MEVACOR) 20 MG tablet TAKE 1 TABLET BY MOUTH EVERYDAY AT BEDTIME 90 tablet 0   No current facility-administered medications for this visit.     OBJECTIVE: Middle-aged African-American man in no acute distress Vitals:   12/19/16 1449  BP: 121/67  Pulse: 98  Resp: 17  Temp: 97.8 F (36.6 C)  SpO2: 100%     Body mass index is 29.59 kg/m.    ECOG FS:0 - Asymptomatic  Sclerae unicteric, pupils round and equal Oropharynx clear and moist No cervical or supraclavicular adenopathy Lungs no rales or rhonchi Heart regular rate and rhythm Abd soft, nontender, positive bowel sounds MSK no focal spinal tenderness, no upper extremity lymphedema Neuro: nonfocal, well oriented, appropriate affect Skin: Sebaceous cyst and upper back, unchanged  LAB RESULTS:  CMP     Component Value Date/Time   NA 139 11/18/2016 0858   K 4.1 11/18/2016 0858   CL 102 02/08/2015 0705   CO2 27 11/18/2016 0858   GLUCOSE 100  11/18/2016 0858   BUN 17.3 11/18/2016 0858   CREATININE 1.1 11/18/2016 0858   CALCIUM 9.4 11/18/2016 0858   PROT 7.3 11/18/2016 0858   ALBUMIN 3.8 11/18/2016 0858   AST 15 11/18/2016 0858   ALT 16 11/18/2016 0858   ALKPHOS 55 11/18/2016 0858   BILITOT 0.24 11/18/2016 0858   GFRNONAA >60 02/08/2015 0705   GFRAA >60 02/08/2015 0705    INo results found for: SPEP, UPEP  Lab Results  Component Value Date   WBC 4.7 11/18/2016   NEUTROABS 3.3 11/18/2016   HGB 11.8 (L) 11/18/2016   HCT 36.8 (L) 11/18/2016   MCV 87.2 11/18/2016   PLT 350 11/18/2016      Chemistry      Component Value Date/Time   NA 139 11/18/2016 0858   K 4.1 11/18/2016 0858   CL 102 02/08/2015 0705   CO2 27 11/18/2016 0858   BUN 17.3 11/18/2016 0858   CREATININE 1.1 11/18/2016 0858      Component Value Date/Time   CALCIUM 9.4 11/18/2016 0858   ALKPHOS 55 11/18/2016 0858   AST 15 11/18/2016 0858   ALT 16 11/18/2016 0858   BILITOT 0.24 11/18/2016 0858       No results found for: LABCA2  No components found for: LABCA125  No results for input(s): INR in the last 168 hours.  Urinalysis    Component Value Date/Time   COLORURINE YELLOW 01/23/2015 0805   APPEARANCEUR CLEAR 01/23/2015 0805   LABSPEC 1.014 01/23/2015 0805   PHURINE 7.5 01/23/2015 0805   GLUCOSEU NEGATIVE 01/23/2015 0805   HGBUR NEGATIVE 01/23/2015 0805   BILIRUBINUR NEGATIVE 01/23/2015 0805   KETONESUR NEGATIVE 01/23/2015 0805   PROTEINUR NEGATIVE 01/23/2015 0805   NITRITE NEGATIVE 01/23/2015 0805   LEUKOCYTESUR NEGATIVE 01/23/2015 0805    STUDIES: PET scan discussed and images reviewed with the patient and his wife   ASSESSMENT: 66 y.o. 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 organ sites presenting with syncope secondary to bleeding from a large ulcerated duodenal ulcer  (a) s/p coil embolization of the feeding (gastro-duodenal) artery 01/23/2015  (b) anemia--scant iron  stores on bone marrow biopsy--s/p feraheme 02/13/2015  (1) right inguinal lymph node biopsy 01/22/2015 confirms mantle cell lymphoma  (a) bone marrow biopsy 02/06/2015 positive for involvement by the patient's mantle cell lymphoma  (b) IPI score of 5 (high risk) predicts a 5 year progression free survival of 50% with CHOP-Rituxan chemotherapy  (c) MIPI score of 5 (intermediate risk) predicts a median survival of 58 months  (2) CHOP/Rituxan started 02/09/2015, completed 8 cycles 07/05/2015  (3)  [facility] consultation 04/06/2015. Patient opted against transplant consolidation  (4) rituximab maintenance started 08/16/2015, repeat every 2 months  (5) PSA increased noted July 2017, back to baseline by December 2017  (6) restaging  PET scan 11/17/2016 is consistent with disease progression  PLAN: I had a long discussion with [name] and his wife.  We reviewed Dr. Charlott Taylor evaluation.  Recommendation is that we repeat a PET scan 3 months after the most recent one to look for further evidence of progression.  If there is, she would proceed to biopsy and would consider ibrutinib or acalabrutinib  We discussed multiple alternatives including proceeding to biopsy now, proceeding to ibrutinib now without biopsy, consideration of high-dose therapy with view of transplantation, or continuing as we have been doing.  At this point we all felt comfortable continuing with rituximab maintenance, but repeating a PET scan at the end of this year.  [name] [name] return to see me January 7.  Depending on those results we [name] consider whether or not to proceed to biopsy and/or change treatment  His next rituximab treatment is scheduled for 01/12/2017.  His PET scan [name] be 02/02/2017.  He was alerted to let us know of any issues that may develop before his next visit here in January.  Franck Vinal, Gary Dad, Taylor  12/19/16 3:23 PM Medical Oncology and Hematology [facility] [address] [place], [place]  [address] Tel. [phone]    Fax. [phone]    This document serves as a record of services personally performed by Lurline Del, Taylor. It was created on his behalf by Steva Colder, a trained medical scribe. The creation of this record is based on the scribe's personal observations and the provider's statements to them.   I have reviewed the above documentation for accuracy and completeness, and I agree with the above.  From Dr Eather Colas OCT 2018 note: 1) Your PET/CT scan showed a few small lymph nodes in your chest that lit up and are concerning for lymphoma relapse. However, given you are not having any symptoms, I would want this confirmed by biopsy before I recommended treatment. I can touch base with our pulmonologists to see how high yield a biopsy of this spot would be but I think it's also fine to wait a few months and do a repeat scan.   2) Given you are feeling well and these areas are a little tough to get at and these lymph nodes are small, I think it makes sense to repeat a PET/CT scan 3 months from your last scan to see if these lymph nodes are getting bigger or you have more bright lymph nodes. At that point, if the PET/CT scan is still concerning, I would recommend a biopsy to confirm relapsed disease.   3) If you do have relapsed disease, I would likely recommend treatment with ibrutinib or acalabrutinib.

## 2016-12-19 ENCOUNTER — Ambulatory Visit (HOSPITAL_BASED_OUTPATIENT_CLINIC_OR_DEPARTMENT_OTHER): Payer: BLUE CROSS/BLUE SHIELD | Admitting: Oncology

## 2016-12-19 ENCOUNTER — Ambulatory Visit: Payer: BLUE CROSS/BLUE SHIELD | Admitting: Oncology

## 2016-12-19 ENCOUNTER — Telehealth: Payer: Self-pay | Admitting: Oncology

## 2016-12-19 VITALS — BP 121/67 | HR 98 | Temp 97.8°F | Resp 17 | Ht 70.0 in | Wt 206.2 lb

## 2016-12-19 DIAGNOSIS — C8319 Mantle cell lymphoma, extranodal and solid organ sites: Secondary | ICD-10-CM

## 2016-12-19 DIAGNOSIS — C8315 Mantle cell lymphoma, lymph nodes of inguinal region and lower limb: Secondary | ICD-10-CM

## 2016-12-19 NOTE — Telephone Encounter (Signed)
Gave patient avs and calendar with appts per 11/16 los.  °

## 2016-12-24 ENCOUNTER — Telehealth: Payer: Self-pay

## 2016-12-24 NOTE — Telephone Encounter (Signed)
Sent a message to [place]. To move patient. Authorization/ needed.per 11/16 sch message

## 2017-01-05 ENCOUNTER — Encounter: Payer: Self-pay | Admitting: Family Medicine

## 2017-01-05 ENCOUNTER — Ambulatory Visit: Payer: BLUE CROSS/BLUE SHIELD | Admitting: Family Medicine

## 2017-01-05 VITALS — BP 110/72 | HR 65 | Temp 97.8°F | Ht 70.0 in | Wt 211.2 lb

## 2017-01-05 DIAGNOSIS — E785 Hyperlipidemia, unspecified: Secondary | ICD-10-CM

## 2017-01-05 DIAGNOSIS — I10 Essential (primary) hypertension: Secondary | ICD-10-CM

## 2017-01-05 NOTE — Patient Instructions (Addendum)
OK to check BP whenever you want. If that is never, so it be.  Fast for 9-12 hours prior to your labs on Monday. Nothing with calories (black coffee, water, medicine OK). Please remind whomever is drawing your blood to add the cholesterol panel. The order will already be in the computer.   Call us when you need refills of your medicine.   Let us know if you need anything.

## 2017-01-05 NOTE — Progress Notes (Signed)
Pre visit review using our clinic review tool, if applicable. No additional management support is needed unless otherwise documented below in the visit note. 

## 2017-01-05 NOTE — Progress Notes (Signed)
Chief Complaint  Patient presents with  . Establish Care       New Patient Visit SUBJECTIVE: HPI: [name] is an 66 y.o.male who is being seen for establishing care.  The patient has not had a PCP as an adult, been getting refills from oncology team.   Hypertension Patient presents for hypertension follow up. He does not routinely monitor home blood pressures. He is compliant with medication- Prinzide Patient has these side effects of medication: none He is adhering to a healthy diet overall. Exercise: jogging and lifting weights  Hyperlipidemia Patient presents for dyslipidemia follow up. Currently being treated with lovastatin 40 mg daily and compliance with treatment thus far has been good. He denies myalgias. He is adhering to a healthy. The patient exercises 5-6 d/week.  The patient is not known to have coexisting coronary artery disease.   No Known Allergies  Past Medical History:  Diagnosis Date  . Anemia   . Cancer ([place])    Lymphoma  . Essential hypertension   . GERD (gastroesophageal reflux disease)   . GI bleed   . HLD (hyperlipidemia)   . Weakness    Past Surgical History:  Procedure Laterality Date  . ESOPHAGOGASTRODUODENOSCOPY Left 01/18/2015   Procedure: ESOPHAGOGASTRODUODENOSCOPY (EGD);  Surgeon: Wilford Corner, MD;  Location: [facility] ENDOSCOPY;  Service: Endoscopy;  Laterality: Left;  . INGUINAL HERNIA REPAIR Right 01/22/2015   Procedure: RIGHT INGUINAL LYMPH NODE BX;  Surgeon: Donnie Mesa, MD;  Location: [facility];  Service: General;  Laterality: Right;  . PORTACATH PLACEMENT  01/26/2015    power port with tip SVC/RA [place]unction  . SKIN SURGERY     Small benign cysts over left scalp removed   Social History   Socioeconomic History  . Marital status: Married   Family History  Problem Relation Age of Onset  . Hypertension Mother   . Hypertension Father   . Hypertension Sister   . Diabetes Sister   . Prostate cancer Brother   . Lupus Sister    . Kidney failure Father      Current Outpatient Medications:  .  lisinopril-hydrochlorothiazide (PRINZIDE,ZESTORETIC) 20-25 MG tablet, Take 1 tablet by mouth daily., Disp: 90 tablet, Rfl: 1 .  lovastatin (MEVACOR) 20 MG tablet, TAKE 1 TABLET BY MOUTH EVERYDAY AT BEDTIME, Disp: 90 tablet, Rfl: 0 .  diazepam (VALIUM) 5 MG tablet, Take 1 tab 1 hour before scan, may repeat x1 (Patient not taking: Reported on 01/05/2017), Disp: 5 tablet, Rfl: 0  ROS Cardiovascular: Denies chest pain  Respiratory: Denies dyspnea   OBJECTIVE: BP 110/72 (BP Location: Left Arm, Patient Position: Sitting, Cuff Size: Large)   Pulse 65   Temp 97.8 F (36.6 C) (Oral)   Ht 5\' 10"  (1.778 m)   Wt 211 lb 4 oz (95.8 kg)   SpO2 99%   BMI 30.31 kg/m   Constitutional: -  VS reviewed -  Well developed, well nourished, appears stated age -  No apparent distress  Psychiatric: -  Oriented to person, place, and time -  Memory intact -  Affect and mood normal -  Fluent conversation, good eye contact -  Judgment and insight age appropriate  Eye: -  Conjunctivae clear, no discharge -  Pupils symmetric, round, reactive to light  ENMT: -  MMM    Pharynx moist, no exudate, no erythema  Neck: -  No gross swelling, no palpable masses -  Thyroid midline, not enlarged, mobile, no palpable masses  Cardiovascular: -  RRR -  No LE edema  Respiratory: -  Normal respiratory effort, no accessory muscle use, no retraction -  Breath sounds equal, no wheezes, no ronchi, no crackles  Musculoskeletal: -  No clubbing, no cyanosis -  Gait normal  Skin: -  No significant lesion on inspection -  Warm and dry to palpation   ASSESSMENT/PLAN: Essential hypertension  Hyperlipidemia, unspecified hyperlipidemia type - Plan: Lipid panel, CANCELED: Lipid panel  I will plan to take over his medication management of chronic conditions. I do not think his oncologist will mind. Will add on lipid panel for next Mon, fasting.  OK to check BP  prn.  Counseled on diet and exercise, he is doing very well.  Patient should return in 1 yr or prn. The patient voiced understanding and agreement to the plan.   [place], DO 01/05/17  8:36 AM

## 2017-01-12 ENCOUNTER — Ambulatory Visit: Payer: BLUE CROSS/BLUE SHIELD

## 2017-01-16 ENCOUNTER — Other Ambulatory Visit: Payer: Self-pay

## 2017-01-16 ENCOUNTER — Ambulatory Visit (HOSPITAL_BASED_OUTPATIENT_CLINIC_OR_DEPARTMENT_OTHER): Payer: BLUE CROSS/BLUE SHIELD

## 2017-01-16 VITALS — BP 106/72 | HR 60 | Temp 98.1°F | Resp 17

## 2017-01-16 DIAGNOSIS — C8315 Mantle cell lymphoma, lymph nodes of inguinal region and lower limb: Secondary | ICD-10-CM

## 2017-01-16 DIAGNOSIS — C8319 Mantle cell lymphoma, extranodal and solid organ sites: Secondary | ICD-10-CM

## 2017-01-16 DIAGNOSIS — Z5112 Encounter for antineoplastic immunotherapy: Secondary | ICD-10-CM

## 2017-01-16 LAB — CBC WITH DIFFERENTIAL/PLATELET
BASO%: 0.3 % (ref 0.0–2.0)
BASOS ABS: 0 10*3/uL (ref 0.0–0.1)
EOS ABS: 0.1 10*3/uL (ref 0.0–0.5)
EOS%: 3.4 % (ref 0.0–7.0)
HEMATOCRIT: 36.9 % — AB (ref 38.4–49.9)
HEMOGLOBIN: 11.8 g/dL — AB (ref 13.0–17.1)
LYMPH#: 1 10*3/uL (ref 0.9–3.3)
LYMPH%: 28.5 % (ref 14.0–49.0)
MCH: 27.6 pg (ref 27.2–33.4)
MCHC: 32 g/dL (ref 32.0–36.0)
MCV: 86.4 fL (ref 79.3–98.0)
MONO#: 0.4 10*3/uL (ref 0.1–0.9)
MONO%: 11 % (ref 0.0–14.0)
NEUT#: 2 10*3/uL (ref 1.5–6.5)
NEUT%: 56.8 % (ref 39.0–75.0)
PLATELETS: 316 10*3/uL (ref 140–400)
RBC: 4.27 10*6/uL (ref 4.20–5.82)
RDW: 13.8 % (ref 11.0–14.6)
WBC: 3.5 10*3/uL — ABNORMAL LOW (ref 4.0–10.3)

## 2017-01-16 LAB — COMPREHENSIVE METABOLIC PANEL
ALBUMIN: 4 g/dL (ref 3.5–5.0)
ALK PHOS: 47 U/L (ref 40–150)
ALT: 22 U/L (ref 0–55)
ANION GAP: 12 meq/L — AB (ref 3–11)
AST: 20 U/L (ref 5–34)
BUN: 20.4 mg/dL (ref 7.0–26.0)
CALCIUM: 8.9 mg/dL (ref 8.4–10.4)
CO2: 24 mEq/L (ref 22–29)
Chloride: 104 mEq/L (ref 98–109)
Creatinine: 1.2 mg/dL (ref 0.7–1.3)
Glucose: 102 mg/dl (ref 70–140)
POTASSIUM: 3.9 meq/L (ref 3.5–5.1)
Sodium: 140 mEq/L (ref 136–145)
Total Bilirubin: 0.36 mg/dL (ref 0.20–1.20)
Total Protein: 6.9 g/dL (ref 6.4–8.3)

## 2017-01-16 LAB — LACTATE DEHYDROGENASE: LDH: 225 U/L (ref 125–245)

## 2017-01-16 MED ORDER — SODIUM CHLORIDE 0.9% FLUSH
10.0000 mL | INTRAVENOUS | Status: DC | PRN
  Administered 2017-01-16: 10 mL
  Filled 2017-01-16: qty 10

## 2017-01-16 MED ORDER — ACETAMINOPHEN 325 MG PO TABS
ORAL_TABLET | ORAL | Status: AC
  Filled 2017-01-16: qty 2

## 2017-01-16 MED ORDER — HEPARIN SOD (PORK) LOCK FLUSH 100 UNIT/ML IV SOLN
500.0000 [IU] | Freq: Once | INTRAVENOUS | Status: AC | PRN
Start: 2017-01-16 — End: 2017-01-16
  Administered 2017-01-16: 500 [IU]
  Filled 2017-01-16: qty 5

## 2017-01-16 MED ORDER — DIPHENHYDRAMINE HCL 25 MG PO CAPS
25.0000 mg | ORAL_CAPSULE | Freq: Once | ORAL | Status: AC
  Administered 2017-01-16: 25 mg via ORAL

## 2017-01-16 MED ORDER — DIPHENHYDRAMINE HCL 25 MG PO CAPS
ORAL_CAPSULE | ORAL | Status: AC
  Filled 2017-01-16: qty 1

## 2017-01-16 MED ORDER — SODIUM CHLORIDE 0.9 % IV SOLN
Freq: Once | INTRAVENOUS | Status: AC
Start: 2017-01-16 — End: 2017-01-16
  Administered 2017-01-16: 09:00:00 via INTRAVENOUS

## 2017-01-16 MED ORDER — ACETAMINOPHEN 325 MG PO TABS
650.0000 mg | ORAL_TABLET | Freq: Once | ORAL | Status: AC
  Administered 2017-01-16: 650 mg via ORAL

## 2017-01-16 MED ORDER — SODIUM CHLORIDE 0.9 % IV SOLN
375.0000 mg/m2 | Freq: Once | INTRAVENOUS | Status: AC
  Administered 2017-01-16: 800 mg via INTRAVENOUS
  Filled 2017-01-16: qty 30

## 2017-01-16 NOTE — Patient Instructions (Signed)
[facility] Discharge Instructions for Patients Receiving Chemotherapy  Today you received the following chemotherapy agents:  Rituxan.  To help prevent nausea and vomiting after your treatment, we encourage you to take your nausea medication as directed.   If you develop nausea and vomiting that is not controlled by your nausea medication, call the clinic.   BELOW ARE SYMPTOMS THAT SHOULD BE REPORTED IMMEDIATELY:  *FEVER GREATER THAN 100.5 F  *CHILLS WITH OR WITHOUT FEVER  NAUSEA AND VOMITING THAT IS NOT CONTROLLED WITH YOUR NAUSEA MEDICATION  *UNUSUAL SHORTNESS OF BREATH  *UNUSUAL BRUISING OR BLEEDING  TENDERNESS IN MOUTH AND THROAT WITH OR WITHOUT PRESENCE OF ULCERS  *URINARY PROBLEMS  *BOWEL PROBLEMS  UNUSUAL RASH Items with * indicate a potential emergency and should be followed up as soon as possible.  Feel free to call the clinic should you have any questions or concerns. The clinic phone number is [phone].  Please show the CHEMO ALERT CARD at check-in to the Emergency Department and triage nurse.   

## 2017-01-17 LAB — BETA 2 MICROGLOBULIN, SERUM: BETA 2: 1.3 mg/L (ref 0.6–2.4)

## 2017-01-19 ENCOUNTER — Other Ambulatory Visit: Payer: Self-pay | Admitting: Oncology

## 2017-01-30 ENCOUNTER — Other Ambulatory Visit: Payer: Self-pay | Admitting: *Deleted

## 2017-01-30 DIAGNOSIS — F40240 Claustrophobia: Secondary | ICD-10-CM

## 2017-01-30 MED ORDER — DIAZEPAM 5 MG PO TABS: ORAL_TABLET | ORAL | 1 refills | Status: DC

## 2017-02-02 ENCOUNTER — Ambulatory Visit (HOSPITAL_COMMUNITY)
Admission: RE | Admit: 2017-02-02 | Discharge: 2017-02-02 | Disposition: A | Discharge status: Home / Self Care | Payer: BLUE CROSS/BLUE SHIELD | Source: Ambulatory Visit | Attending: Oncology | Admitting: Oncology

## 2017-02-02 DIAGNOSIS — C8319 Mantle cell lymphoma, extranodal and solid organ sites: Secondary | ICD-10-CM | POA: Insufficient documentation

## 2017-02-02 DIAGNOSIS — R590 Localized enlarged lymph nodes: Secondary | ICD-10-CM | POA: Insufficient documentation

## 2017-02-02 LAB — GLUCOSE, CAPILLARY: GLUCOSE-CAPILLARY: 104 mg/dL — AB (ref 65–99)

## 2017-02-02 IMAGING — CT NM PET TUM IMG RESTAG (PS) SKULL BASE T - THIGH
1 of 8 series · 1 of 25 positions shown · non-contrast
Comparison: Multiple prior PET CTs. The most recent examination is
[DATE]

CLINICAL DATA: Subsequent treatment strategy for mantle cell
lymphoma.

EXAM:
NUCLEAR MEDICINE PET SKULL BASE TO THIGH
TECHNIQUE: [3N] mCi F-18 FDG was injected intravenously. Full-ring PET imaging
was performed from the skull base to thigh after the radiotracer. CT
data was obtained and used for attenuation correction and anatomic
localization.
FASTING BLOOD GLUCOSE:  Value: 104 mg/dl

[Series 4: ct sk_thigh 5.0 hd_fov · axial · 5.0mm · 1.17mm/px · 1 of 218 slices shown]
[im 218/218  brain]
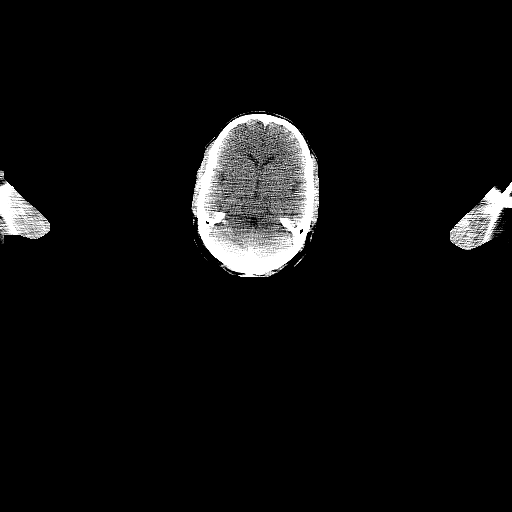

[1 of 25 positions shown; findings below may reference images not displayed]

FINDINGS: NECK: No hypermetabolic lymph nodes in the neck.

CHEST: Persistent hypermetabolic mediastinal and hilar
lymphadenopathy.

7 mm pretracheal lymph node on image number 50 has an SUV max of
3.5. This previously measured 5 mm and was not clearly
hypermetabolic.

10.5 mm prevascular lymph node on image number 55 previously
measured 7 mm. SUV max is 10.0 and was previously 9.3.

13 mm precarinal lymph node on image number 58 previously measured 8
mm. SUV max is 7.8 and was previously 3.8.

Subcarinal and bilateral hilar nodes appear stable size wise and the
SUV max is also relatively stable between 9 and 10.

No pulmonary nodules. Numerous small bilateral axillary lymph nodes
are stable. No hypermetabolism. Stable right-sided Port-A-Cath.

ABDOMEN/PELVIS: No abnormal hypermetabolic activity within the
liver, pancreas, adrenal glands, or spleen. No hypermetabolic lymph
nodes in the abdomen or pelvis.

SKELETON: No focal hypermetabolic activity to suggest skeletal
metastasis.
IMPRESSION: 1. Slightly progressive mediastinal and hilar lymphadenopathy as
specifically detailed above.
2. No neck, supraclavicular, axillary, abdominal/pelvic or inguinal
lymphadenopathy.
3. No osseous involvement.

## 2017-02-02 MED ORDER — FLUDEOXYGLUCOSE F - 18 (FDG) INJECTION
10.4000 | Freq: Once | INTRAVENOUS | Status: AC | PRN
  Administered 2017-02-02: 10.4 via INTRAVENOUS

## 2017-02-05 ENCOUNTER — Other Ambulatory Visit: Payer: Self-pay | Admitting: Oncology

## 2017-02-05 MED ORDER — IBRUTINIB 420 MG PO TABS: 420.0000 mg | ORAL_TABLET | Freq: Every morning | ORAL | 4 refills | Status: DC

## 2017-02-06 ENCOUNTER — Telehealth: Payer: Self-pay | Admitting: Pharmacy Technician

## 2017-02-06 NOTE — Telephone Encounter (Signed)
Oral Oncology Patient Advocate Encounter  Received notification from [facility] that prior authorization for [employer] is required.  PA submitted on CoverMyMeds Key [id number] Status is pending  Oral Oncology Clinic will continue to follow.  Fabio Asa. Melynda Keller, [place] Patient [place] [phone] 02/06/2017 12:36 PM

## 2017-02-06 NOTE — Progress Notes (Signed)
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 He "blew this off" and it did not happen again until 01/17/2015 when he felt weak at work and later fainted at home. He was brought to the ED where he was found to be guaiac positive (denies melena before that date) with a Hb of 4.5 MCV 82.2, platelets 295K, WBC 9.1 and normal INR and PTT. He was admitted and underwent EGD under Dr Michail Sermon 01/18/2015 showing a very large duodenal ulcer w clot. Abdominal US 01/19/2015 was not very informative but CT abd/pelvis 12/17,2016 showed adenopathy in the anterior mediastinum, retroperitoneum and pelvis, with a large ulcerated mass involving the distal stomach/proximal duodenum and extending into the gastrohepatic region.   On 01/22/2015 he underwent right inguinal lymph node biopsy, consistent with mantle cell lymphoma (([id number] and FZB [id number])). Specifically the lymph node showed effacement of the architecture by sheets of small to medium sized lymphocytes with no apparent nodularity. The cells were positive for CD20, CD5, BCL-2, CD21, and  cyclin D1. He 67 was low (10-20%). CD21 revealed a few residual germinal centers area at the B cells were negative for CD10, and BCL-6. CD23 was negative.  On 02/06/2015 the patient underwent a bone marrow aspirate and biopsy. The pathology from this procedure (FZB 17-2 and 17-7) was positive for involvement by non-Hodgkin's lymphoma with numerous atypical interstitial and paratrabecular lymphoid aggregates consistent with mantle cell lymphoma. Iron stain showed scant iron flow cytometry confirmed a monoclonal B-cell population, lambda restricted, consistent with the patient's known diagnosis of mantle cell lymphoma.  On 01/18/2015 the patient underwent EGD which found a massive duodenal bulb ulcer with adherent clot, although no active bleeding.his hemoglobin was in the 8.4 range and stable. He was discharged on 01/22/2015, but within 24 hours was readmitted with syncope and altered mental status. He had aspirated and required intubation for his respiratory failure. Hemoglobin was down to 6.3. An NG tube was placed, the patient was multiply transfused, and Surgery was considered but felt not to be feasible.accordingly the patient underwent arteriography 01/23/2015 which found the superior mesenteric and celiac arteries not to be the involved vessels. The gastroduodenal artery showed multiple branches providing supply to the hypervascular duodenal mass, and this artery was coil embolized.. Also a port was placed at the time of this procedure with a view to eventual chemotherapy.  He was discharged 01/26/2015,but required readmission 02/07/2015 for another presyncopal episode. He presented to the emergency room where he was found to have a hemoglobin of 7.5. The patient was transfusedIIA hemoglobin of 10 and was discharged 02/07/2014. He was scheduled to start CHOP right toxin 02/08/2014.  His subsequent history is as detailed below.  INTERVAL HISTORY:  [name] returns today for further follow-up and  treatment of his mantle cell non-Hodgkin's lymphoma. He notes that he is okay overall.  Specifically, he denies any B symptoms or change in functional status.  Pet scan on 02/02/2017 revealing: Slightly progressive mediastinal and hilar lymphadenopathy  No neck, supraclavicular, axillary, abdominal/pelvic or inguinal lymphadenopathy. No osseous involvement  REVIEW OF SYSTEMS: Hiep reports that he is still working, and he notes that he is very cherished at work. For exercise, he reports that he gets up at 4am and lifts weights and does floor exercises. He notes that he isn't currently running, but his new years resolution is to start running again at 2 miles once he is feeling better health wise. He notes that occasionally he gets pimples under the skin on his leg. He denies unusual headaches, visual changes, nausea, vomiting, or dizziness. There has been no unusual cough, phlegm production, or pleurisy. This been no change in bowel or bladder habits. He denies unexplained fatigue or unexplained weight loss, bleeding, rash, or fever. A detailed review of systems was otherwise stable.      PAST MEDICAL HISTORY: Past Medical History:  Diagnosis Date  . Anemia   . Cancer ([place])    Lymphoma  . Essential hypertension   . GERD (gastroesophageal reflux disease)   . GI bleed   . HLD (hyperlipidemia)   . Weakness     PAST SURGICAL HISTORY: Past Surgical History:  Procedure Laterality Date  . ESOPHAGOGASTRODUODENOSCOPY Left 01/18/2015   Procedure: ESOPHAGOGASTRODUODENOSCOPY (EGD);  Surgeon: Wilford Corner, MD;  Location: [facility] ENDOSCOPY;  Service: Endoscopy;  Laterality: Left;  . INGUINAL HERNIA REPAIR Right 01/22/2015   Procedure: RIGHT INGUINAL LYMPH NODE BX;  Surgeon: Donnie Mesa, MD;  Location: [place];  Service: General;  Laterality: Right;  . PORTACATH PLACEMENT  01/26/2015    power port with tip SVC/RA [place]  . SKIN SURGERY     Small benign cysts over left scalp removed    FAMILY  HISTORY Family History  Problem Relation Age of Onset  . Hypertension Mother   . Hypertension Father   . Hypertension Sister   . Diabetes Sister   . Prostate cancer Brother   . Lupus Sister   . Kidney failure Father    The patient's father died from CHF complications age 80. The patient's mother is 46 y/o as of December 2016. The patient has 9 brothers, 8 sisters. One brother has prostate cancer. There is no other cancer history in the family to his knowledge.  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 His wife of 9 years, [name], is disabled due to RA. Daughter, [name] works as a Teacher, early years/pre for [employer] (she previously worked at the [employer] in records). Son, [name] owns a window washing business. Son, [name] is in Press photographer at [employer]. All live in [place]. The patient has 3 grandchildren. He attends a [employer].    ADVANCED DIRECTIVES: Not in place   HEALTH MAINTENANCE: Social History   Tobacco Use  . Smoking status: Never Smoker  . Smokeless tobacco: Never Used  Substance Use Topics  . Alcohol use: Yes    Comment: socially  . Drug use: No     Colonoscopy:  PSA: 2.27 on 01/22/2015  Hepatitis serologies (B surface antigen, hepatitis B core antibody and hepatitis C IgM) negative 01/18/2015  No Known Allergies  Current Outpatient Medications  Medication Sig Dispense Refill  . diazepam (VALIUM) 5 MG tablet Take 1 tab 1  hour before scan, may repeat x1 5 tablet 1  . lisinopril-hydrochlorothiazide (PRINZIDE,ZESTORETIC) 20-25 MG tablet TAKE 1 TABLET BY MOUTH EVERY DAY 90 tablet 1  . lovastatin (MEVACOR) 20 MG tablet TAKE 1 TABLET BY MOUTH EVERYDAY AT BEDTIME 90 tablet 0  . Ibrutinib 420 MG TABS Take 420 mg by mouth every morning. (Patient not taking: Reported on 02/09/2017) 90 tablet 4   No current facility-administered medications for this visit.     OBJECTIVE: Middle-aged African-American man who  appears well Vitals:   02/09/17 0810  BP: (!) 147/99  Pulse: 67  Resp: 16  Temp: 98.1 F (36.7 C)  SpO2: 100%     Body mass index is 30.42 kg/m.    ECOG FS:0 - Asymptomatic  Sclerae unicteric, EOMs intact Oropharynx clear and moist No cervical or supraclavicular adenopathy, no axillary or inguinal adenopathy lungs no rales or rhonchi Heart regular rate and rhythm Abd soft, nontender, positive bowel sounds MSK no focal spinal tenderness, no upper extremity lymphedema Neuro: nonfocal, well oriented, appropriate affect  LAB RESULTS:  CMP     Component Value Date/Time   NA 140 01/16/2017 0837   K 3.9 01/16/2017 0837   CL 102 02/08/2015 0705   CO2 24 01/16/2017 0837   GLUCOSE 102 01/16/2017 0837   BUN 20.4 01/16/2017 0837   CREATININE 1.2 01/16/2017 0837   CALCIUM 8.9 01/16/2017 0837   PROT 6.9 01/16/2017 0837   ALBUMIN 4.0 01/16/2017 0837   AST 20 01/16/2017 0837   ALT 22 01/16/2017 0837   ALKPHOS 47 01/16/2017 0837   BILITOT 0.36 01/16/2017 0837   GFRNONAA >60 02/08/2015 0705   GFRAA >60 02/08/2015 0705    INo results found for: SPEP, UPEP  Lab Results  Component Value Date   WBC 3.5 (L) 01/16/2017   NEUTROABS 2.0 01/16/2017   HGB 11.8 (L) 01/16/2017   HCT 36.9 (L) 01/16/2017   MCV 86.4 01/16/2017   PLT 316 01/16/2017      Chemistry      Component Value Date/Time   NA 140 01/16/2017 0837   K 3.9 01/16/2017 0837   CL 102 02/08/2015 0705   CO2 24 01/16/2017 0837   BUN 20.4 01/16/2017 0837   CREATININE 1.2 01/16/2017 0837      Component Value Date/Time   CALCIUM 8.9 01/16/2017 0837   ALKPHOS 47 01/16/2017 0837   AST 20 01/16/2017 0837   ALT 22 01/16/2017 0837   BILITOT 0.36 01/16/2017 0837       No results found for: LABCA2  No components found for: [id number]  No results for input(s): INR in the last 168 hours.  Urinalysis    Component Value Date/Time   COLORURINE YELLOW 01/23/2015 0805   APPEARANCEUR CLEAR 01/23/2015 0805   LABSPEC 1.014  01/23/2015 0805   PHURINE 7.5 01/23/2015 0805   GLUCOSEU NEGATIVE 01/23/2015 0805   HGBUR NEGATIVE 01/23/2015 0805   BILIRUBINUR NEGATIVE 01/23/2015 0805   KETONESUR NEGATIVE 01/23/2015 0805   PROTEINUR NEGATIVE 01/23/2015 0805   NITRITE NEGATIVE 01/23/2015 0805   LEUKOCYTESUR NEGATIVE 01/23/2015 0805    STUDIES: Nm Pet Image Restag (ps) Skull Base To Thigh  Result Date: 02/02/2017 CLINICAL DATA:  Subsequent treatment strategy for mantle cell lymphoma. EXAM: NUCLEAR MEDICINE PET SKULL BASE TO THIGH TECHNIQUE: 1044 mCi F-18 FDG was injected intravenously. Full-ring PET imaging was performed from the skull base to thigh after the radiotracer. CT data was obtained and used for attenuation correction and anatomic localization. FASTING BLOOD GLUCOSE:  Value:  104 mg/dl COMPARISON:  Multiple prior PET CTs. The most recent examination is 11/15/2016 FINDINGS: NECK: No hypermetabolic lymph nodes in the neck. CHEST: Persistent hypermetabolic mediastinal and hilar lymphadenopathy. 7 mm pretracheal lymph node on image number 50 has an SUV max of 3.5. This previously measured 5 mm and was not clearly hypermetabolic. 10.5 mm prevascular lymph node on image number 55 previously measured 7 mm. SUV max is 10.0 and was previously 9.3. 13 mm precarinal lymph node on image number 58 previously measured 8 mm. SUV max is 7.8 and was previously 3.8. Subcarinal and bilateral hilar nodes appear stable size wise and the SUV max is also relatively stable between 9 and 10. No pulmonary nodules. Numerous small bilateral axillary lymph nodes are stable. No hypermetabolism. Stable right-sided Port-A-Cath. ABDOMEN/PELVIS: No abnormal hypermetabolic activity within the liver, pancreas, adrenal glands, or spleen. No hypermetabolic lymph nodes in the abdomen or pelvis. SKELETON: No focal hypermetabolic activity to suggest skeletal metastasis. IMPRESSION: 1. Slightly progressive mediastinal and hilar lymphadenopathy as specifically  detailed above. 2. No neck, supraclavicular, axillary, abdominal/pelvic or inguinal lymphadenopathy. 3. No osseous involvement. Electronically Signed   By: Marijo Sanes M.D.   On: 02/02/2017 14:18    Pet scan on 02/02/2017 revealing: Slightly progressive mediastinal and hilar lymphadenopathy a No neck, supraclavicular, axillary, abdominal/pelvic or inguinal lymphadenopathy. No osseous involvement   ASSESSMENT: 67 y.o. [place] man with a diagnosis of mantle cell non-Hodgkin's lymphoma of extranodal and solid organ sites presenting with syncope secondary to bleeding from a large ulcerated duodenal ulcer  (a) s/p coil embolization of the feeding (gastro-duodenal) artery 01/23/2015  (b) anemia--scant iron stores on bone marrow biopsy--s/p feraheme 02/13/2015  (1) right inguinal lymph node biopsy 01/22/2015 confirms mantle cell lymphoma  (a) bone marrow biopsy 02/06/2015 positive for involvement by the patient's mantle cell lymphoma  (b) IPI score of 5 (high risk) predicts a 5 year progression free survival of 50% with CHOP-Rituxan chemotherapy  (c) MIPI score of 5 (intermediate risk) predicts a median survival of 58 months  (2) CHOP/Rituxan started 02/09/2015, completed 8 cycles 07/05/2015  (3)  [facility] consultation 04/06/2015. Patient opted against transplant consolidation  (4) rituximab maintenance started 08/16/2015, repeat every 2 months  (5) PSA increase noted July 2017, back to baseline by December 2017  (6) restaging PET scan 11/17/2016 is consistent with disease progression  (a) repeat PET scan 02/02/2017 confirms   (7) ibrutinib 420 mg daily started 02/09/2017  PLAN: I spent approximately 30 minutes with [name] going over his PET scan results and showing him the images.  Of course he also received a written report.  He understands that we do see minimal but measurable and consistent evidence of disease progression.  The good news is that we do not see 40 spots within normal as  masses plus B symptoms, but a few millimeters here and a half a millimeter they are of disease progression in a very restricted number of lymph nodes.  This means we are not dealing with a terribly aggressive version of mantle cell lymphoma at this point.  We discussed the recommendation from [facility] that before starting treatment we biopsy 1 of these masses.  However getting at them would not be easy and I have no doubt in my mind that we are dealing with the same mantle cell lymphoma as before.  Accordingly I suggested a trial of therapy.  Specifically we are going to continue the rituximab and we are going to add ibrutinib.  I discussed the possible  toxicity side effects and complications of this agent with him and he also met with our oral chemotherapy pharmacist for more details.  We were able to obtain this for him at nominal cost.  He [name] start that this week.  I [name] see him again 03/09/2017 when he comes for his next rituximab treatment.  Assuming all is going well then the plan [name] be to repeat a PET scan after 3 months on this drug.  If and when we get to a complete remission radiographically we [name] stop the rituximab at that time and continue the ibrutinib.  Of course if we see disease progression or no response we would proceed to biopsy  [name] has a good understanding of this plan.  He agrees with it.  He knows the goal of treatment in his case is control.  He [name] call with any other issues that may develop before his next visit.   [name], Virgie Dad, MD  02/09/17 8:41 AM Medical Oncology and Hematology [facility] [address] [place], [place] [address] Tel. [phone]    Fax. [phone]  This document serves as a record of services personally performed by Lurline Del, MD. It was created on his behalf by Sheron Nightingale, a trained medical scribe. The creation of this record is based on the scribe's personal observations and the provider's statements to them.    I have reviewed the above documentation for accuracy and completeness, and I agree with the above.

## 2017-02-09 ENCOUNTER — Telehealth: Payer: Self-pay | Admitting: Oncology

## 2017-02-09 ENCOUNTER — Inpatient Hospital Stay: Payer: BLUE CROSS/BLUE SHIELD | Attending: Hematology and Oncology | Admitting: Oncology

## 2017-02-09 ENCOUNTER — Telehealth: Payer: Self-pay | Admitting: Pharmacist

## 2017-02-09 VITALS — BP 147/99 | HR 67 | Temp 98.1°F | Resp 16 | Ht 70.0 in | Wt 212.0 lb

## 2017-02-09 DIAGNOSIS — C8319 Mantle cell lymphoma, extranodal and solid organ sites: Secondary | ICD-10-CM

## 2017-02-09 DIAGNOSIS — Z79899 Other long term (current) drug therapy: Secondary | ICD-10-CM

## 2017-02-09 DIAGNOSIS — K219 Gastro-esophageal reflux disease without esophagitis: Secondary | ICD-10-CM | POA: Insufficient documentation

## 2017-02-09 DIAGNOSIS — I10 Essential (primary) hypertension: Secondary | ICD-10-CM | POA: Diagnosis not present

## 2017-02-09 DIAGNOSIS — D649 Anemia, unspecified: Secondary | ICD-10-CM

## 2017-02-09 DIAGNOSIS — E785 Hyperlipidemia, unspecified: Secondary | ICD-10-CM | POA: Insufficient documentation

## 2017-02-09 MED ORDER — IBRUTINIB 420 MG PO TABS: 420.0000 mg | ORAL_TABLET | Freq: Every day | ORAL | 4 refills | Status: DC

## 2017-02-09 MED FILL — IMBRUVICA 420 MG TAB: 420 | 28 days supply | Qty: 28 | Fill #0

## 2017-02-09 NOTE — Telephone Encounter (Signed)
Oral Oncology Patient Advocate Encounter  Prior Authorization for Kate Sable has been approved.    PA# [id number] Effective dates: 02/06/17 through 02/06/18  Oral Oncology Clinic will continue to follow.   Fabio Asa. Melynda Keller, [place] Patient [place] [phone] 02/09/2017 8:44 AM

## 2017-02-09 NOTE — Telephone Encounter (Signed)
Oral Chemotherapy Pharmacist Encounter   I spoke with patient in exam room for overview ofn: Imbruvica.   Pt is doing well. Counseled patient on administration, dosing, side effects, monitoring, drug-food interactions, safe handling, storage, and disposal.  Patient will take Imbruvica 420mg  tablets, 1 tablet (420mg ) by mouth once daily. Patient will take Imbruvica at approximately the same time each day with a full glass of water and maintain adequate hydration throughout the day. Patient knows to avoid grapefruit or grapefruit juice while on therapy with Imbruvica. Imbruvica start date: 02/11/17  Side effects include but not limited to: bruising, decreased blood counts, N/V, diarrhea, musculoskeletal pain, arthralgias, peripheral edema, and hemorrhage.    Reviewed with patient importance of keeping a medication schedule and plan for any missed doses.  [name] voiced understanding and appreciation.   All questions answered. Medication reconciliation performed and medication/allergy list updated.  [name] will be ready for pick-up at the [facility] on 02/10/17 after 2pm for copayment $10 with manufacturer copayment card..   Patient knows to call the office with questions or concerns. Oral Oncology Clinic will continue to follow.  Thank you,  Johny Drilling, PharmD, BCPS, BCOP 02/09/2017   3:47 PM Oral Oncology Clinic [phone]

## 2017-02-09 NOTE — Telephone Encounter (Signed)
Gave patient avs and calendar with appt per 1/7 los

## 2017-02-09 NOTE — Telephone Encounter (Signed)
Oral Oncology Pharmacist Encounter  Received new prescription for Imbruvica (ibrutinib) for the treatment of previously treatment mantle cell lymphoma in conjunction with infusional rituxinmab, planned duration until disease progression or unacceptable toxicity.  Labs from 01/16/17 assessed, OK for treatment. BPs reviewed, will be monitored. No significant cardiac Hx identified. EKGs in Epic reviewed, patient in NSR.  Current medication list in Epic reviewed, no DDIs with imbruvica identified.  Prescription has been e-scribed to the [facility] for benefits analysis and approval.  Oral Oncology Clinic will continue to follow for insurance authorization, copayment issues, initial counseling and start date.  Johny Drilling, PharmD, BCPS, BCOP 02/09/2017 3:39 PM Oral Oncology Clinic [phone]

## 2017-03-03 MED FILL — IMBRUVICA 420 MG TAB: 420 | 28 days supply | Qty: 28 | Fill #1

## 2017-03-06 NOTE — Progress Notes (Signed)
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 He "blew this off" and it did not happen again until 01/17/2015 when he felt weak at work and later fainted at home. He was brought to the ED where he was found to be guaiac positive (denies melena before that date) with a Hb of 4.5 MCV 82.2, platelets 295K, WBC 9.1 and normal INR and PTT. He was admitted and underwent EGD under Dr Michail Sermon 01/18/2015 showing a very large duodenal ulcer w clot. Abdominal US 01/19/2015 was not very informative but CT abd/pelvis 12/17,2016 showed adenopathy in the anterior mediastinum, retroperitoneum and pelvis, with a large ulcerated mass involving the distal stomach/proximal duodenum and extending into the gastrohepatic region.   On 01/22/2015 he underwent right inguinal lymph node biopsy, consistent with mantle cell lymphoma (([id number] and [id number])). Specifically the lymph node showed effacement of the architecture by sheets of small to medium sized lymphocytes with no apparent nodularity. The cells were positive for CD20, CD5, BCL-2, CD21, and  cyclin D1. He 67 was low (10-20%). CD21 revealed a few residual germinal centers area at the B cells were negative for CD10, and BCL-6. CD23 was negative.  On 02/06/2015 the patient underwent a bone marrow aspirate and biopsy. The pathology from this procedure (FZB 17-2 and 17-7) was positive for involvement by non-Hodgkin's lymphoma with numerous atypical interstitial and paratrabecular lymphoid aggregates consistent with mantle cell lymphoma. Iron stain showed scant iron flow cytometry confirmed a monoclonal B-cell population, lambda restricted, consistent with the patient's known diagnosis of mantle cell lymphoma.  On 01/18/2015 the patient underwent EGD which found a massive duodenal bulb ulcer with adherent clot, although no active bleeding.his hemoglobin was in the 8.4 range and stable. He was discharged on 01/22/2015, but within 24 hours was readmitted with syncope and altered mental status. He had aspirated and required intubation for his respiratory failure. Hemoglobin was down to 6.3. An NG tube was placed, the patient was multiply transfused, and Surgery was considered but felt not to be feasible.accordingly the patient underwent arteriography 01/23/2015 which found the superior mesenteric and celiac arteries not to be the involved vessels. The gastroduodenal artery showed multiple branches providing supply to the hypervascular duodenal mass, and this artery was coil embolized.. Also a port was placed at the time of this procedure with a view to eventual chemotherapy.  He was discharged 01/26/2015,but required readmission 02/07/2015 for another presyncopal episode. He presented to the emergency room where he was found to have a hemoglobin of 7.5. The patient was transfusedIIA hemoglobin of 10 and was discharged 02/07/2014. He was scheduled to start CHOP right toxin 02/08/2014.  His subsequent history is as detailed below.  INTERVAL HISTORY:  [name] returns today for further follow-up and  treatment of his mantle cell non-Hodgkin's lymphoma. He continues on rituximab and ibrutinib  He is tolerating the ibrutinib remarkably well.  He feels he actually has more energy and is doing everything that he normally does including his very vigorous exercise program and working full-time.  He has not had any problems with rash fever or sweats.  He is not aware of any adenopathy.  His weight is stable.  He also continues to tolerate the rituximab with no side effects that he is aware of.  REVIEW OF SYSTEMS: Detailed review of systems today was otherwise stable  PAST MEDICAL HISTORY: Past Medical History:  Diagnosis Date  . Anemia   . Cancer ([place])    Lymphoma  . Essential hypertension   . GERD (gastroesophageal reflux disease)   . GI bleed   . HLD (hyperlipidemia)   . Weakness     PAST SURGICAL HISTORY: Past Surgical History:  Procedure Laterality Date  . ESOPHAGOGASTRODUODENOSCOPY Left 01/18/2015   Procedure: ESOPHAGOGASTRODUODENOSCOPY (EGD);  Surgeon: Wilford Corner, MD;  Location: [facility] ENDOSCOPY;  Service: Endoscopy;  Laterality: Left;  . INGUINAL HERNIA REPAIR Right 01/22/2015   Procedure: RIGHT INGUINAL LYMPH NODE BX;  Surgeon: Donnie Mesa, MD;  Location: [facility];  Service: General;  Laterality: Right;  . PORTACATH PLACEMENT  01/26/2015    power port with tip SVC/RA Junction  . SKIN SURGERY     Small benign cysts over left scalp removed    FAMILY HISTORY Family History  Problem Relation Age of Onset  . Hypertension Mother   . Hypertension Father   . Hypertension Sister   . Diabetes Sister   . Prostate cancer Brother   . Lupus Sister   . Kidney failure Father    The patient's father died from CHF complications age 2. The patient's mother is 71 y/o as of December 2016. The patient has 9 brothers, 8 sisters. One brother has prostate cancer. There is no other cancer history in the family to his knowledge.  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 His wife of 55 years, [name], is disabled due to RA. Daughter, [name] works as a Teacher, early years/pre for [employer] (she previously worked at the [employer] in records). Son, [name] owns a window washing business. Son, [name] is in Press photographer at [employer]. All live in [place]. The patient has 3 grandchildren. He attends a [employer].    ADVANCED DIRECTIVES: Not in place   HEALTH MAINTENANCE: Social History   Tobacco Use  . Smoking status: Never Smoker  . Smokeless tobacco: Never Used  Substance Use Topics  . Alcohol use: Yes    Comment: socially  . Drug use: No     Colonoscopy:  PSA: 2.27 on 01/22/2015  Hepatitis serologies (B surface antigen, hepatitis B core antibody and hepatitis C IgM) negative 01/18/2015  No Known Allergies  Current Outpatient Medications  Medication Sig Dispense Refill  . diazepam (VALIUM) 5 MG tablet Take 1 tab 1 hour before scan, may repeat x1 5 tablet 1  . Ibrutinib 420 MG TABS Take 420 mg by mouth daily. Take with a full glass of water at approximately the same time each day 84 tablet 4  . lisinopril-hydrochlorothiazide (PRINZIDE,ZESTORETIC) 20-25 MG tablet TAKE 1 TABLET BY MOUTH EVERY DAY 90 tablet 1  . lovastatin (MEVACOR) 20 MG tablet TAKE 1 TABLET BY MOUTH EVERYDAY AT BEDTIME 90 tablet 0   No current facility-administered medications for this  visit.     OBJECTIVE: Middle-aged African-American man in no acute distress Vitals:   03/09/17 0819  BP: 136/77  Pulse: 70  Resp: 18  Temp: 98.2 F (36.8 C)  SpO2: 100%     Body mass index is 30.35 kg/m.    ECOG FS:0 - Asymptomatic  Sclerae unicteric, pupils round and equal Oropharynx clear and moist No cervical or supraclavicular adenopathy, no axillary or inguinal adenoma Lungs no rales or rhonchi Heart regular rate and rhythm Abd soft, nontender, positive bowel sounds MSK no focal spinal tenderness, no upper extremity lymphedema Neuro: nonfocal, well  oriented, appropriate affect  LAB RESULTS:  CMP     Component Value Date/Time   NA 138 03/09/2017 0757   NA 140 01/16/2017 0837   K 4.0 03/09/2017 0757   K 3.9 01/16/2017 0837   CL 101 03/09/2017 0757   CO2 29 03/09/2017 0757   CO2 24 01/16/2017 0837   GLUCOSE 90 03/09/2017 0757   GLUCOSE 102 01/16/2017 0837   BUN 19 03/09/2017 0757   BUN 20.4 01/16/2017 0837   CREATININE 1.31 (H) 03/09/2017 0757   CREATININE 1.2 01/16/2017 0837   CALCIUM 9.1 03/09/2017 0757   CALCIUM 8.9 01/16/2017 0837   PROT 7.1 03/09/2017 0757   PROT 6.9 01/16/2017 0837   ALBUMIN 4.0 03/09/2017 0757   ALBUMIN 4.0 01/16/2017 0837   AST 16 03/09/2017 0757   AST 20 01/16/2017 0837   ALT 14 03/09/2017 0757   ALT 22 01/16/2017 0837   ALKPHOS 41 03/09/2017 0757   ALKPHOS 47 01/16/2017 0837   BILITOT 0.4 03/09/2017 0757   BILITOT 0.36 01/16/2017 0837   GFRNONAA 56 (L) 03/09/2017 0757   GFRAA >60 03/09/2017 0757    INo results found for: SPEP, UPEP  Lab Results  Component Value Date   WBC 5.7 03/09/2017   NEUTROABS 3.8 03/09/2017   HGB 12.5 (L) 03/09/2017   HCT 38.8 03/09/2017   MCV 85.3 03/09/2017   PLT 327 03/09/2017      Chemistry      Component Value Date/Time   NA 138 03/09/2017 0757   NA 140 01/16/2017 0837   K 4.0 03/09/2017 0757   K 3.9 01/16/2017 0837   CL 101 03/09/2017 0757   CO2 29 03/09/2017 0757   CO2 24 01/16/2017 0837   BUN 19 03/09/2017 0757   BUN 20.4 01/16/2017 0837   CREATININE 1.31 (H) 03/09/2017 0757   CREATININE 1.2 01/16/2017 0837      Component Value Date/Time   CALCIUM 9.1 03/09/2017 0757   CALCIUM 8.9 01/16/2017 0837   ALKPHOS 41 03/09/2017 0757   ALKPHOS 47 01/16/2017 0837   AST 16 03/09/2017 0757   AST 20 01/16/2017 0837   ALT 14 03/09/2017 0757   ALT 22 01/16/2017 0837   BILITOT 0.4 03/09/2017 0757   BILITOT 0.36 01/16/2017 0837       No results found for: LABCA2  No components found for: GBMSX115  No results for input(s): INR in the last  168 hours.  Urinalysis    Component Value Date/Time   COLORURINE YELLOW 01/23/2015 0805   APPEARANCEUR CLEAR 01/23/2015 0805   LABSPEC 1.014 01/23/2015 0805   PHURINE 7.5 01/23/2015 0805   GLUCOSEU NEGATIVE 01/23/2015 0805   HGBUR NEGATIVE 01/23/2015 0805   BILIRUBINUR NEGATIVE 01/23/2015 0805   KETONESUR NEGATIVE 01/23/2015 0805   PROTEINUR NEGATIVE 01/23/2015 0805   NITRITE NEGATIVE 01/23/2015 0805   LEUKOCYTESUR NEGATIVE 01/23/2015 0805    STUDIES: No results found.  Pet scan on 02/02/2017 revealing: Slightly progressive mediastinal and hilar lymphadenopathy a No neck, supraclavicular, axillary, abdominal/pelvic or inguinal lymphadenopathy. No osseous involvement   ASSESSMENT: 67 y.o. [place] man with a diagnosis of mantle cell non-Hodgkin's lymphoma of extranodal and solid organ sites presenting with syncope secondary to bleeding from a large ulcerated duodenal ulcer  (a) s/p coil embolization of the feeding (gastro-duodenal) artery 01/23/2015  (b) anemia--scant iron stores on bone marrow biopsy--s/p feraheme 02/13/2015  (1) right inguinal lymph node biopsy 01/22/2015 confirms mantle cell lymphoma  (a) bone marrow biopsy 02/06/2015 positive for involvement by the patient's mantle cell lymphoma  (b) IPI score of 5 (high risk) predicts a 5 year progression free survival of 50% with CHOP-Rituxan chemotherapy  (c) MIPI score of 5 (intermediate risk) predicts a median survival of 58 months  (2) CHOP/Rituxan started 02/09/2015, completed 8 cycles 07/05/2015  (3)  [facility] consultation 04/06/2015. Patient opted against transplant consolidation  (4) rituximab maintenance started 08/16/2015, repeat every 2 months  (5) PSA increase noted July 2017, back to baseline by December 2017  (6) restaging PET scan 11/17/2016 is consistent with disease progression  (a) repeat PET scan 02/02/2017 confirms   (7) ibrutinib 420 mg daily started 02/09/2017  PLAN: [name] Is tolerating the  ibrutinib remarkably well his hemoglobin is slightly improved.  He actually feels he has more energy although he does say by the time he gets to bed he falls asleep immediately and sleeps right through the night.  The plan is to continue both the right toxin and ibrutinib for now.  He [name] receive Rituxan today and 2 months from now.  He [name] see me with that dose.  A few days before that he [name] have a repeat PET scan.  If he is having a response particularly if he has a complete response, we [name] stop the rituximab and continue the ibrutinib alone.  Otherwise we [name] have to reassess  Today we reviewed "B" symptoms and he knows to call for any other problems that may develop before his next visit.   Hildur Bayer, Virgie Dad, MD  03/09/17 8:57 AM Medical Oncology and Hematology [facility] [address] [place],  [address] Tel. [phone]    Fax. [phone]  This document serves as a record of services personally performed by Lurline Del, MD. It was created on his behalf by Sheron Nightingale, a trained medical scribe. The creation of this record is based on the scribe's personal observations and the provider's statements to them.   I have reviewed the above documentation for accuracy and completeness, and I agree with the above.

## 2017-03-09 ENCOUNTER — Inpatient Hospital Stay: Payer: BLUE CROSS/BLUE SHIELD

## 2017-03-09 ENCOUNTER — Telehealth: Payer: Self-pay | Admitting: Oncology

## 2017-03-09 ENCOUNTER — Inpatient Hospital Stay: Payer: BLUE CROSS/BLUE SHIELD | Attending: Hematology and Oncology | Admitting: Oncology

## 2017-03-09 VITALS — BP 136/77 | HR 70 | Temp 98.2°F | Resp 18 | Ht 70.0 in | Wt 211.5 lb

## 2017-03-09 VITALS — BP 114/78 | HR 64 | Temp 98.2°F | Resp 16

## 2017-03-09 DIAGNOSIS — Z79899 Other long term (current) drug therapy: Secondary | ICD-10-CM | POA: Diagnosis not present

## 2017-03-09 DIAGNOSIS — I10 Essential (primary) hypertension: Secondary | ICD-10-CM | POA: Diagnosis not present

## 2017-03-09 DIAGNOSIS — E785 Hyperlipidemia, unspecified: Secondary | ICD-10-CM | POA: Diagnosis not present

## 2017-03-09 DIAGNOSIS — K264 Chronic or unspecified duodenal ulcer with hemorrhage: Secondary | ICD-10-CM | POA: Diagnosis not present

## 2017-03-09 DIAGNOSIS — C8319 Mantle cell lymphoma, extranodal and solid organ sites: Secondary | ICD-10-CM

## 2017-03-09 DIAGNOSIS — K219 Gastro-esophageal reflux disease without esophagitis: Secondary | ICD-10-CM | POA: Insufficient documentation

## 2017-03-09 DIAGNOSIS — Z9221 Personal history of antineoplastic chemotherapy: Secondary | ICD-10-CM | POA: Diagnosis not present

## 2017-03-09 DIAGNOSIS — D649 Anemia, unspecified: Secondary | ICD-10-CM | POA: Insufficient documentation

## 2017-03-09 DIAGNOSIS — Z5112 Encounter for antineoplastic immunotherapy: Secondary | ICD-10-CM | POA: Diagnosis not present

## 2017-03-09 LAB — LACTATE DEHYDROGENASE: LDH: 210 U/L (ref 125–245)

## 2017-03-09 LAB — CBC WITH DIFFERENTIAL/PLATELET
Basophils Absolute: 0 10*3/uL (ref 0.0–0.1)
Basophils Relative: 0 %
Eosinophils Absolute: 0 10*3/uL (ref 0.0–0.5)
Eosinophils Relative: 1 %
HEMATOCRIT: 38.8 % (ref 38.4–49.9)
HEMOGLOBIN: 12.5 g/dL — AB (ref 13.0–17.1)
LYMPHS ABS: 1.1 10*3/uL (ref 0.9–3.3)
LYMPHS PCT: 19 %
MCH: 27.5 pg (ref 27.2–33.4)
MCHC: 32.2 g/dL (ref 32.0–36.0)
MCV: 85.3 fL (ref 79.3–98.0)
Monocytes Absolute: 0.8 10*3/uL (ref 0.1–0.9)
Monocytes Relative: 15 %
NEUTROS ABS: 3.8 10*3/uL (ref 1.5–6.5)
NEUTROS PCT: 65 %
Platelets: 327 10*3/uL (ref 140–400)
RBC: 4.55 MIL/uL (ref 4.20–5.82)
RDW: 13.9 % (ref 11.0–14.6)
WBC: 5.7 10*3/uL (ref 4.0–10.3)

## 2017-03-09 LAB — COMPREHENSIVE METABOLIC PANEL
ALT: 14 U/L (ref 0–55)
AST: 16 U/L (ref 5–34)
Albumin: 4 g/dL (ref 3.5–5.0)
Alkaline Phosphatase: 41 U/L (ref 40–150)
Anion gap: 8 (ref 3–11)
BUN: 19 mg/dL (ref 7–26)
CHLORIDE: 101 mmol/L (ref 98–109)
CO2: 29 mmol/L (ref 22–29)
Calcium: 9.1 mg/dL (ref 8.4–10.4)
Creatinine, Ser: 1.31 mg/dL — ABNORMAL HIGH (ref 0.70–1.30)
GFR, EST NON AFRICAN AMERICAN: 56 mL/min — AB (ref >60)
Glucose, Bld: 90 mg/dL (ref 70–140)
POTASSIUM: 4 mmol/L (ref 3.5–5.1)
SODIUM: 138 mmol/L (ref 136–145)
Total Bilirubin: 0.4 mg/dL (ref 0.2–1.2)
Total Protein: 7.1 g/dL (ref 6.4–8.3)

## 2017-03-09 MED ORDER — ACETAMINOPHEN 325 MG PO TABS
650.0000 mg | ORAL_TABLET | Freq: Once | ORAL | Status: AC
  Administered 2017-03-09: 650 mg via ORAL

## 2017-03-09 MED ORDER — DIPHENHYDRAMINE HCL 25 MG PO CAPS
ORAL_CAPSULE | ORAL | Status: AC
Start: 2017-03-09 — End: 2017-03-09
  Filled 2017-03-09: qty 2

## 2017-03-09 MED ORDER — SODIUM CHLORIDE 0.9 % IV SOLN
375.0000 mg/m2 | Freq: Once | INTRAVENOUS | Status: AC
  Administered 2017-03-09: 800 mg via INTRAVENOUS
  Filled 2017-03-09: qty 50

## 2017-03-09 MED ORDER — SODIUM CHLORIDE 0.9 % IV SOLN
Freq: Once | INTRAVENOUS | Status: AC
  Administered 2017-03-09: 09:00:00 via INTRAVENOUS

## 2017-03-09 MED ORDER — DIPHENHYDRAMINE HCL 25 MG PO CAPS
25.0000 mg | ORAL_CAPSULE | Freq: Once | ORAL | Status: AC
  Administered 2017-03-09: 25 mg via ORAL

## 2017-03-09 MED ORDER — HEPARIN SOD (PORK) LOCK FLUSH 100 UNIT/ML IV SOLN
500.0000 [IU] | Freq: Once | INTRAVENOUS | Status: AC | PRN
  Administered 2017-03-09: 500 [IU]
  Filled 2017-03-09: qty 5

## 2017-03-09 MED ORDER — SODIUM CHLORIDE 0.9% FLUSH
10.0000 mL | INTRAVENOUS | Status: DC | PRN
  Administered 2017-03-09: 10 mL
  Filled 2017-03-09: qty 10

## 2017-03-09 MED ORDER — ACETAMINOPHEN 325 MG PO TABS
ORAL_TABLET | ORAL | Status: AC
  Filled 2017-03-09: qty 2

## 2017-03-09 NOTE — Patient Instructions (Signed)
[facility] Discharge Instructions for Patients Receiving Chemotherapy  Today you received the following chemotherapy agents:  Rituxan.  To help prevent nausea and vomiting after your treatment, we encourage you to take your nausea medication as directed.   If you develop nausea and vomiting that is not controlled by your nausea medication, call the clinic.   BELOW ARE SYMPTOMS THAT SHOULD BE REPORTED IMMEDIATELY:  *FEVER GREATER THAN 100.5 F  *CHILLS WITH OR WITHOUT FEVER  NAUSEA AND VOMITING THAT IS NOT CONTROLLED WITH YOUR NAUSEA MEDICATION  *UNUSUAL SHORTNESS OF BREATH  *UNUSUAL BRUISING OR BLEEDING  TENDERNESS IN MOUTH AND THROAT WITH OR WITHOUT PRESENCE OF ULCERS  *URINARY PROBLEMS  *BOWEL PROBLEMS  UNUSUAL RASH Items with * indicate a potential emergency and should be followed up as soon as possible.  Feel free to call the clinic should you have any questions or concerns. The clinic phone number is [phone].  Please show the CHEMO ALERT CARD at check-in to the Emergency Department and triage nurse.   

## 2017-03-09 NOTE — Addendum Note (Signed)
Addended by: Chauncey Cruel on: 03/09/2017 09:14 AM   Modules accepted: Orders

## 2017-03-09 NOTE — Telephone Encounter (Signed)
Gave avs and calendar for march °

## 2017-03-10 LAB — BETA 2 MICROGLOBULIN, SERUM: Beta-2 Microglobulin: 1.6 mg/L (ref 0.6–2.4)

## 2017-03-26 ENCOUNTER — Other Ambulatory Visit: Payer: Self-pay

## 2017-03-26 ENCOUNTER — Encounter (HOSPITAL_COMMUNITY): Payer: Self-pay | Admitting: Emergency Medicine

## 2017-03-26 ENCOUNTER — Emergency Department (HOSPITAL_COMMUNITY)
Admission: EM | Admit: 2017-03-26 | Discharge: 2017-03-26 | Disposition: A | Discharge status: Home / Self Care | Payer: BLUE CROSS/BLUE SHIELD | Attending: Emergency Medicine | Admitting: Emergency Medicine

## 2017-03-26 DIAGNOSIS — R21 Rash and other nonspecific skin eruption: Secondary | ICD-10-CM | POA: Diagnosis not present

## 2017-03-26 DIAGNOSIS — Z5321 Procedure and treatment not carried out due to patient leaving prior to being seen by health care provider: Secondary | ICD-10-CM | POA: Diagnosis not present

## 2017-03-26 LAB — CBC WITH DIFFERENTIAL/PLATELET
BASOS ABS: 0 10*3/uL (ref 0.0–0.1)
BASOS PCT: 0 %
Eosinophils Absolute: 0 10*3/uL (ref 0.0–0.7)
Eosinophils Relative: 0 %
HEMATOCRIT: 35.2 % — AB (ref 39.0–52.0)
HEMOGLOBIN: 11.3 g/dL — AB (ref 13.0–17.0)
Lymphocytes Relative: 22 %
Lymphs Abs: 1.2 10*3/uL (ref 0.7–4.0)
MCH: 26.8 pg (ref 26.0–34.0)
MCHC: 32.1 g/dL (ref 30.0–36.0)
MCV: 83.6 fL (ref 78.0–100.0)
Monocytes Absolute: 1 10*3/uL (ref 0.1–1.0)
Monocytes Relative: 18 %
NEUTROS ABS: 3.2 10*3/uL (ref 1.7–7.7)
NEUTROS PCT: 60 %
Platelets: 370 10*3/uL (ref 150–400)
RBC: 4.21 MIL/uL — AB (ref 4.22–5.81)
RDW: 14 % (ref 11.5–15.5)
WBC: 5.4 10*3/uL (ref 4.0–10.5)

## 2017-03-26 LAB — COMPREHENSIVE METABOLIC PANEL
ALK PHOS: 37 U/L — AB (ref 38–126)
ALT: 26 U/L (ref 17–63)
ANION GAP: 12 (ref 5–15)
AST: 27 U/L (ref 15–41)
Albumin: 4.2 g/dL (ref 3.5–5.0)
BUN: 19 mg/dL (ref 6–20)
CALCIUM: 9 mg/dL (ref 8.9–10.3)
CHLORIDE: 101 mmol/L (ref 101–111)
CO2: 24 mmol/L (ref 22–32)
Creatinine, Ser: 1.19 mg/dL (ref 0.61–1.24)
GFR calc non Af Amer: >60 mL/min (ref >60)
Glucose, Bld: 90 mg/dL (ref 65–99)
Potassium: 3.7 mmol/L (ref 3.5–5.1)
SODIUM: 137 mmol/L (ref 135–145)
Total Bilirubin: 0.7 mg/dL (ref 0.3–1.2)
Total Protein: 7.1 g/dL (ref 6.5–8.1)

## 2017-03-26 LAB — I-STAT BETA HCG BLOOD, ED (MC, WL, AP ONLY): I-stat hCG, quantitative: <5 m[IU]/mL (ref <5)

## 2017-03-26 NOTE — ED Triage Notes (Signed)
Pt presents with rash, specifically open wound to right upper leg, with taking imbruvica.

## 2017-03-27 ENCOUNTER — Telehealth: Payer: Self-pay

## 2017-03-27 ENCOUNTER — Other Ambulatory Visit: Payer: Self-pay | Admitting: Oncology

## 2017-03-27 MED FILL — CEPHALEXIN 500 MG CAPSULE: 500 | 7 days supply | Qty: 14 | Fill #0

## 2017-03-27 NOTE — Progress Notes (Signed)
Will call to tell us that he had a lesion in his right upper leg anteriorly that was probably a boil.  His wife wondered if it could be due to the ibrutinib.  I asked him to pop in.  What he has in that area is a 2 x 2 centimeter round lesion with raised pink borders, and a necrotic center, with a little bit of debris and perhaps a little pus in it.  He tells me he has had these for years.  I do not think this is related to the ibrutinib in any way.  I am starting him on Keflex.  I asked him to keep it clean and dry and to let me know if it is not considerably better by Monday.

## 2017-03-27 NOTE — Telephone Encounter (Signed)
Received VM from pt's wife in regards to patient has a "boil on left thigh", reports it has a "hard core in center" and is red in color a "strawberry".  No warmth, drainage, or fever reported. Called pt's wife and she said patient went to the ER yesterday but had to leave before visit was completed, "they did bloodwork".  Instructed her, per Dr Jana Hakim, pt should come by his office for a visit today, 3:30 pm to further assess this boil.  Wife voiced understanding and is going to contact pt at his work to see if he can leave early.  Wife will call us back.

## 2017-03-27 NOTE — Telephone Encounter (Signed)
Notified pt's wife that per Dr Jana Hakim, pt can stop by and be seen this afternoon, pt can arrive between 5-5:15 pm and pt will page Dr Jana Hakim when he arrives in our lobby - to have thigh "boil" assessed further.

## 2017-03-30 MED FILL — IMBRUVICA 420 MG TAB: 420 | 28 days supply | Qty: 28 | Fill #2

## 2017-04-20 ENCOUNTER — Ambulatory Visit (HOSPITAL_COMMUNITY)
Admission: RE | Admit: 2017-04-20 | Discharge: 2017-04-20 | Disposition: A | Discharge status: Home / Self Care | Payer: BLUE CROSS/BLUE SHIELD | Source: Ambulatory Visit | Attending: Oncology | Admitting: Oncology

## 2017-04-20 DIAGNOSIS — C8319 Mantle cell lymphoma, extranodal and solid organ sites: Secondary | ICD-10-CM | POA: Diagnosis not present

## 2017-04-20 DIAGNOSIS — R590 Localized enlarged lymph nodes: Secondary | ICD-10-CM | POA: Insufficient documentation

## 2017-04-20 LAB — GLUCOSE, CAPILLARY: Glucose-Capillary: 98 mg/dL (ref 65–99)

## 2017-04-20 IMAGING — CT NM PET TUM IMG RESTAG (PS) SKULL BASE T - THIGH
9 series · 19 of 25 positions shown · non-contrast
Comparison: Multiple prior PET CTs.  The most recent is [DATE]

CLINICAL DATA: Subsequent treatment strategy for mantle cell
lymphoma.

EXAM:
NUCLEAR MEDICINE PET SKULL BASE TO THIGH
TECHNIQUE: 10.6 mCi F-18 FDG was injected intravenously. Full-ring PET imaging
was performed from the skull base to thigh after the radiotracer. CT
data was obtained and used for attenuation correction and anatomic
localization.
Fasting blood glucose: 98 mg/dl
Mediastinal blood pool activity: SUV max

[Series 3: pet sk_thigh ac · axial · 5.0mm · 4.07mm/px · z∈[-1461,-585]mm · 3 of 220 slices shown]
[im 1/220]
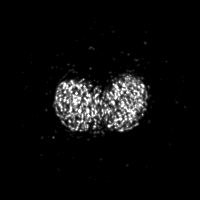
[im 74/220]
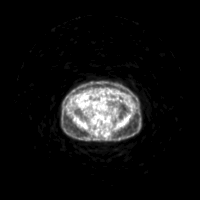
[im 220/220]
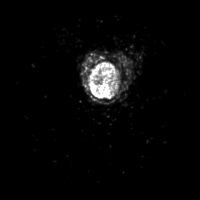

[Series 4: ct sk_thigh 5.0 b31f · axial · 5.0mm · 0.98mm/px · z∈[-1461,-585]mm · 3 of 220 slices shown]
[im 1/220]
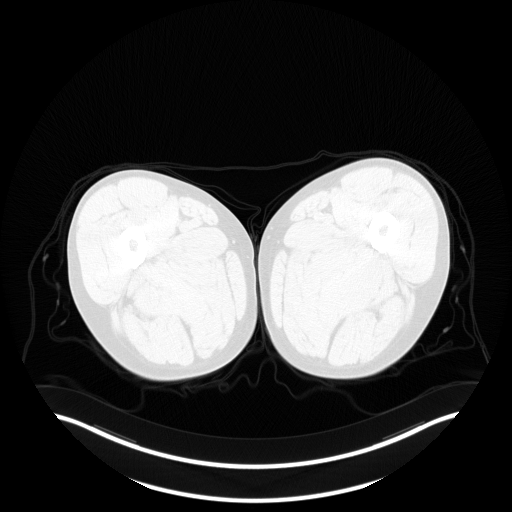
[im 74/220]
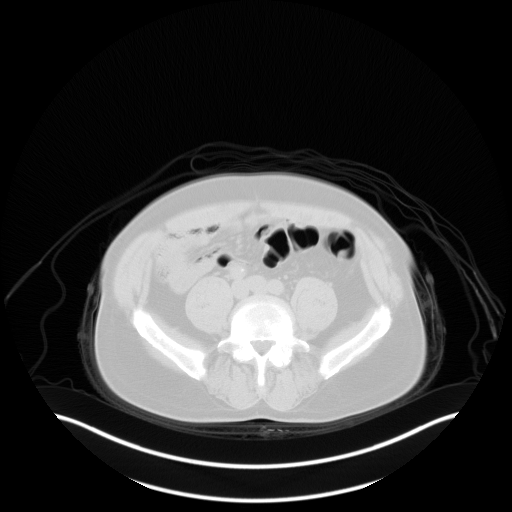
[im 220/220  brain]
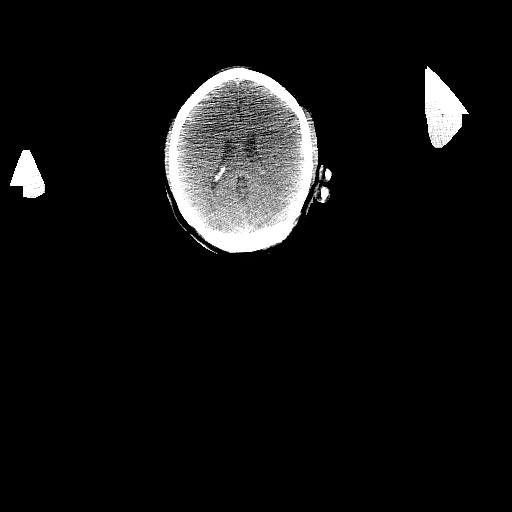

[Series 5: pet sk_thigh nac · axial · 5.0mm · 4.07mm/px · z∈[-1461,-585]mm · 4 of 220 slices shown]
[im 1/220]
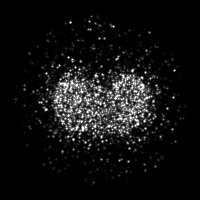
[im 55/220]
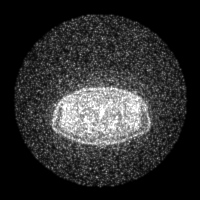
[im 165/220]
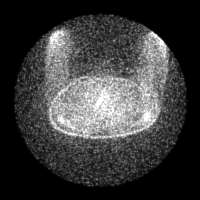
[im 220/220]
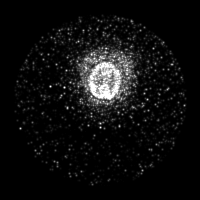

[Series 8: ct sk_thigh 5.0 b70f (id)_bone · axial · 5.0mm · 0.72mm/px · 1 of 65 slices shown]
[im 1/65  bone]
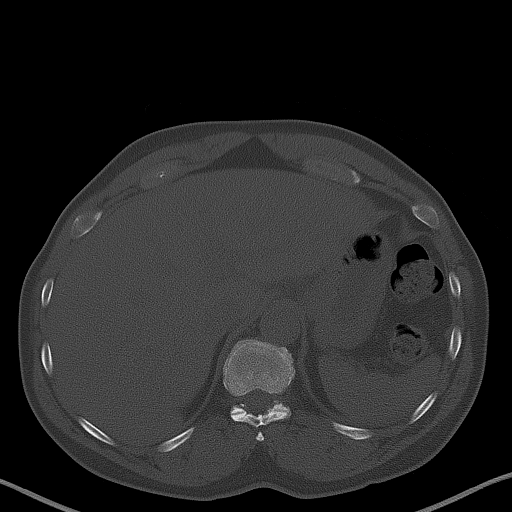

[Series 604: mip range 2 · coronal · 1.82mm/px · 1 of 32 slices shown]
[im 1/32]
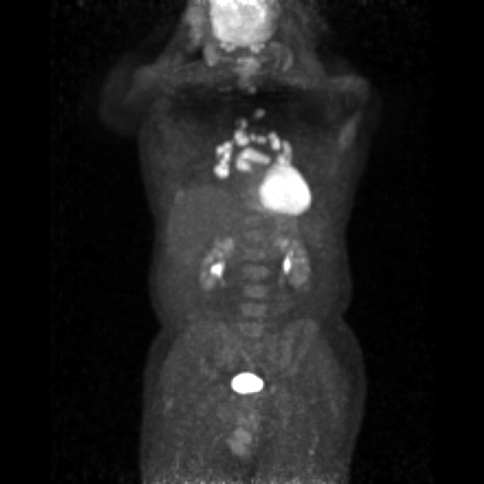

[Series 605: range-ct sk_thigh 5.0 (id)<alpha range> · 2 of 89 slices shown (1 of 2)]
[im 1/89]
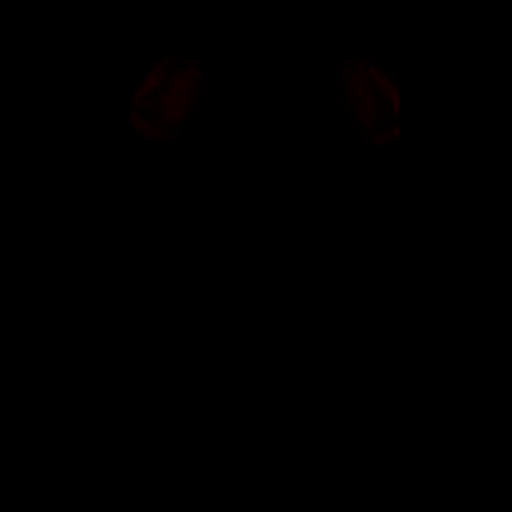
[im 89/89]
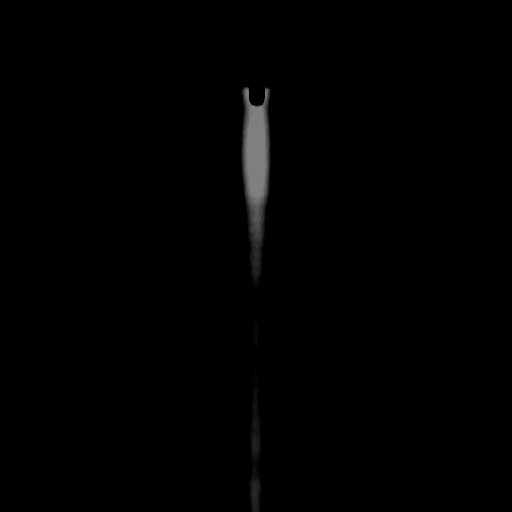

[Series 606: range-ct sk_thigh 5.0 (id)<alpha range> · 3 of 212 slices shown (2 of 2)]
[im 53/212]
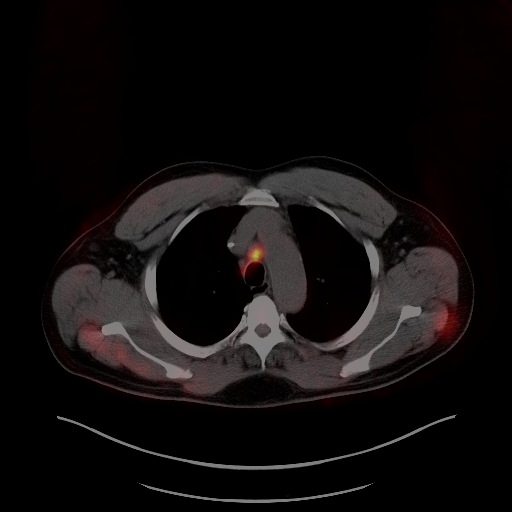
[im 106/212]
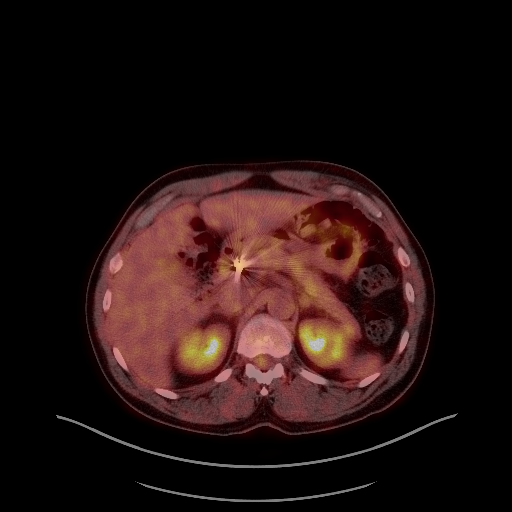
[im 159/212]
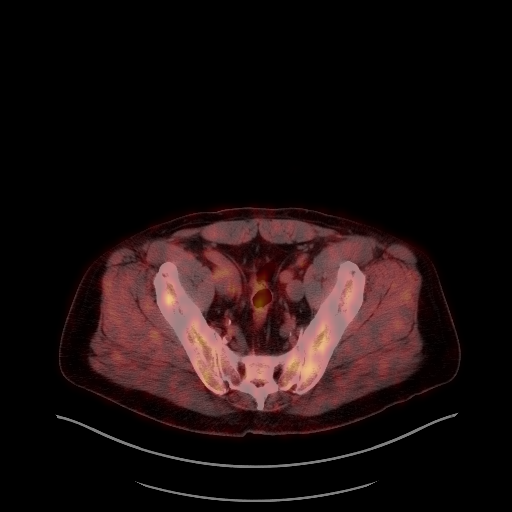

[Series 1426: results mm oncology reading · 5.0mm · 0.50mm/px · 1 of 5 slices shown (1 of 2)]
[im 1/5]
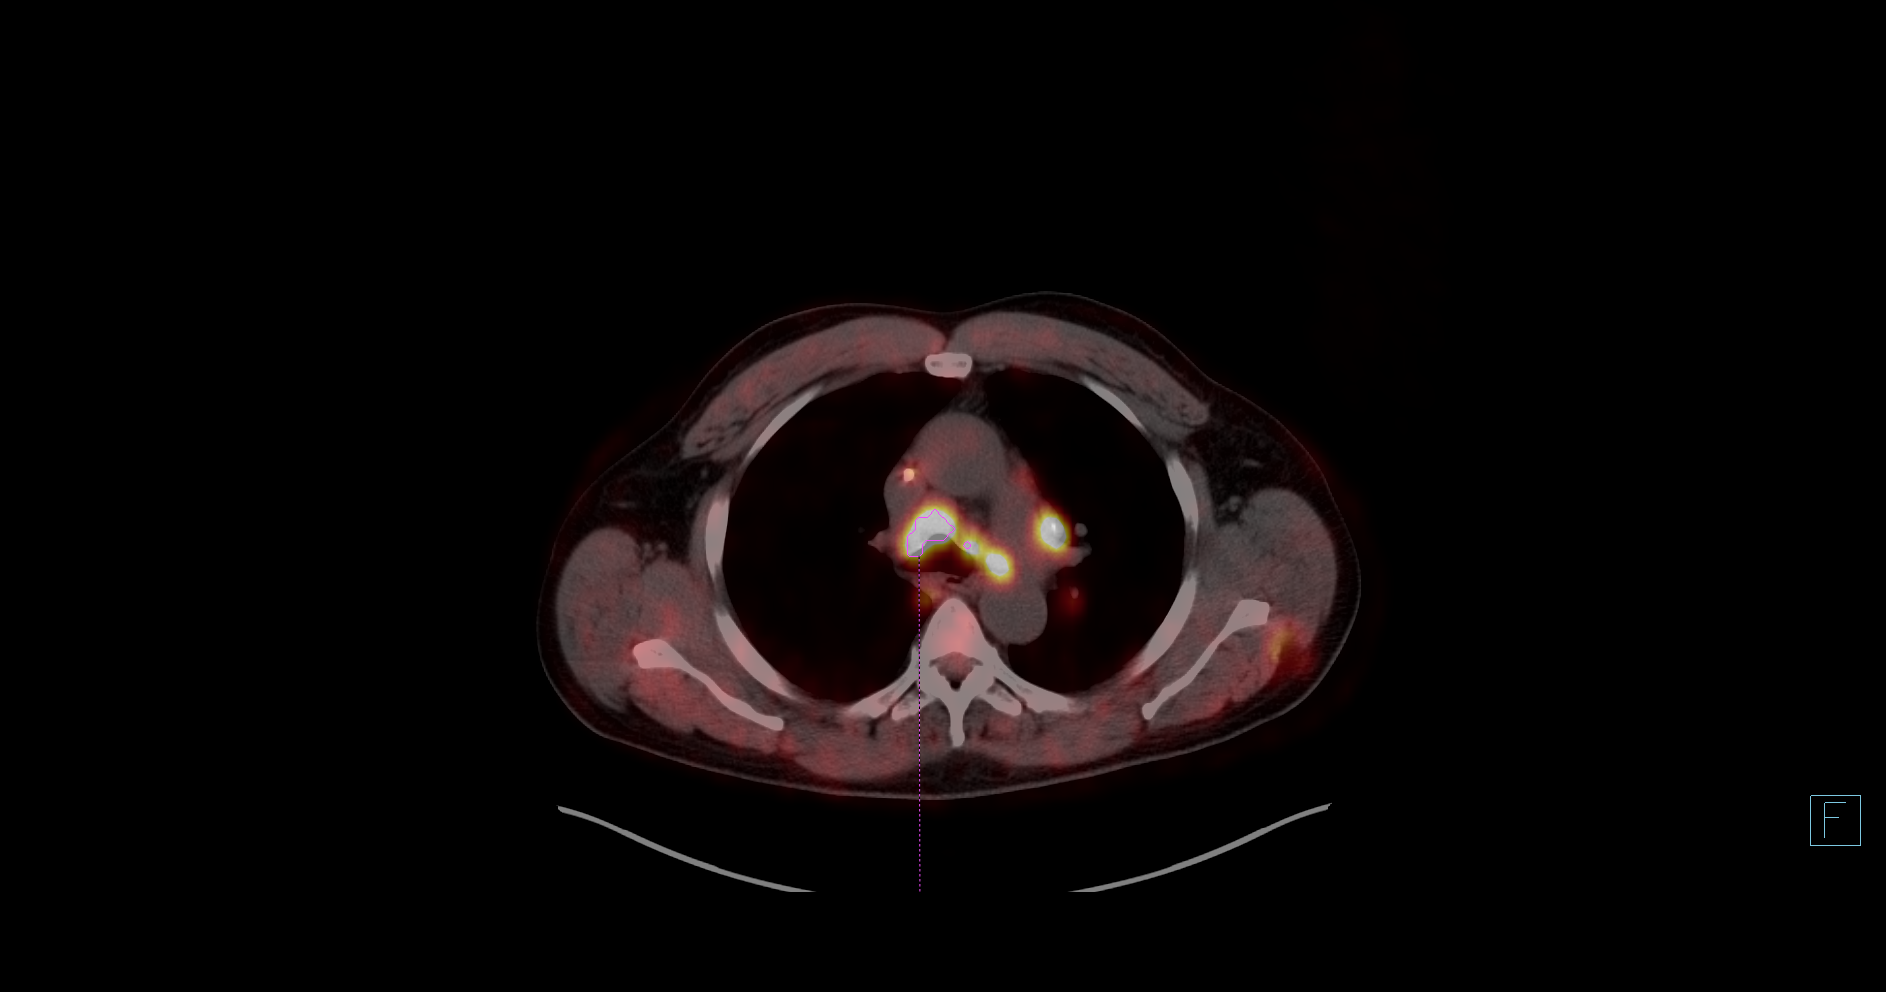

[Series 1703: results mm oncology reading · 4.0mm · 1.06mm/px · 1 of 2 slices shown (2 of 2)]
[im 1/2]
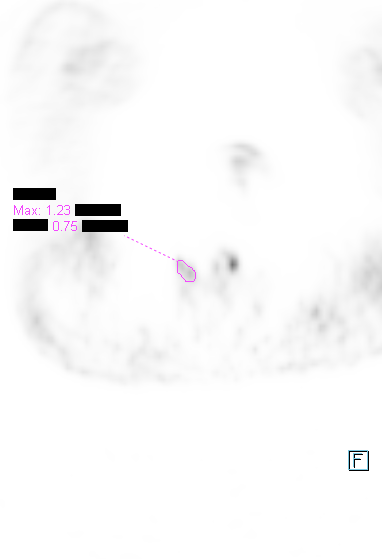

[19 of 25 positions shown; findings below may reference images not displayed]

FINDINGS: NECK: No hypermetabolic lymph nodes in the neck. Significant motion
artifact.

Incidental CT findings: none

CHEST: Progressive mediastinal and hilar lymphadenopathy. 19.5 mm
pretracheal lymph node on image number 64 previously measured
mm. SUV max is 15.4 and was previously 7.8.

Right hilar node is difficult to measure without contrast but has
definitely enlarged and SUV max is 19.6 and was previously 9.8.

Subcarinal nodal mass measures 19.5 mm on image number 69 and
previously measured 13.5 mm. SUV max is 18.9 and was previously 6.6.

No worrisome pulmonary lesions.

Incidental CT findings: none

ABDOMEN/PELVIS: No abnormal hypermetabolic activity within the
liver, pancreas, adrenal glands, or spleen. No hypermetabolic lymph
nodes in the abdomen or pelvis. No evidence of recurrent disease in
the abdomen/pelvis.

Incidental CT findings: none

SKELETON: Diffuse marrow activity likely due to rebound from
chemotherapy or marrow stimulating drugs.

Incidental CT findings: none
IMPRESSION: 1. Progressive hypermetabolic mediastinal and hilar lymphadenopathy
as detailed above.
2. No findings for recurrent disease in the abdomen.

## 2017-04-20 MED ORDER — FLUDEOXYGLUCOSE F - 18 (FDG) INJECTION
10.6000 | Freq: Once | INTRAVENOUS | Status: AC | PRN
  Administered 2017-04-20: 10.6 via INTRAVENOUS

## 2017-04-21 NOTE — Progress Notes (Signed)
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 He "blew this off" and it did not happen again until 01/17/2015 when he felt weak at work and later fainted at home. He was brought to the ED where he was found to be guaiac positive (denies melena before that date) with a Hb of 4.5 MCV 82.2, platelets 295K, WBC 9.1 and normal INR and PTT. He was admitted and underwent EGD under Dr Michail Sermon 01/18/2015 showing a very large duodenal ulcer w clot. Abdominal US 01/19/2015 was not very informative but CT abd/pelvis 12/17,2016 showed adenopathy in the anterior mediastinum, retroperitoneum and pelvis, with a large ulcerated mass involving the distal stomach/proximal duodenum and extending into the gastrohepatic region.   On 01/22/2015 he underwent right inguinal lymph node biopsy, consistent with mantle cell lymphoma (([id number] and [id number])). Specifically the lymph node showed effacement of the architecture by sheets of small to medium sized lymphocytes with no apparent nodularity. The cells were positive for CD20, CD5, BCL-2, CD21, and  cyclin D1. He 67 was low (10-20%). CD21 revealed a few residual germinal centers area at the B cells were negative for CD10, and BCL-6. CD23 was negative.  On 02/06/2015 the patient underwent a bone marrow aspirate and biopsy. The pathology from this procedure (FZB 17-2 and 17-7) was positive for involvement by non-Hodgkin's lymphoma with numerous atypical interstitial and paratrabecular lymphoid aggregates consistent with mantle cell lymphoma. Iron stain showed scant iron flow cytometry confirmed a monoclonal B-cell population, lambda restricted, consistent with the patient's known diagnosis of mantle cell lymphoma.  On 01/18/2015 the patient underwent EGD which found a massive duodenal bulb ulcer with adherent clot, although no active bleeding.his hemoglobin was in the 8.4 range and stable. He was discharged on 01/22/2015, but within 24 hours was readmitted with syncope and altered mental status. He had aspirated and required intubation for his respiratory failure. Hemoglobin was down to 6.3. An NG tube was placed, the patient was multiply transfused, and Surgery was considered but felt not to be feasible.accordingly the patient underwent arteriography 01/23/2015 which found the superior mesenteric and celiac arteries not to be the involved vessels. The gastroduodenal artery showed multiple branches providing supply to the hypervascular duodenal mass, and this artery was coil embolized.. Also a port was placed at the time of this procedure with a view to eventual chemotherapy.  He was discharged 01/26/2015,but required readmission 02/07/2015 for another presyncopal episode. He presented to the emergency room where he was found to have a hemoglobin of 7.5. The patient was transfusedIIA hemoglobin of 10 and was discharged 02/07/2014. He was scheduled to start CHOP right toxin 02/08/2014.  His subsequent history is as detailed below.  INTERVAL HISTORY:  [name] returns today for further follow-up and  treatment of his mantle cell non-Hodgkin's lymphoma. He continues on rituximab and ibrutinib  He is tolerating the ibrutinib remarkably well.  He feels he actually has more energy and is doing everything that he normally does including his very vigorous exercise program and working full-time.  He has not had any problems with rash fever or sweats.  He is not aware of any adenopathy.  His weight is stable.  He also continues to tolerate the rituximab with no side effects that he is aware of.  REVIEW OF SYSTEMS: Detailed review of systems today was otherwise stable  PAST MEDICAL HISTORY: Past Medical History:  Diagnosis Date  . Anemia   . Cancer ([place])    Lymphoma  . Essential hypertension   . GERD (gastroesophageal reflux disease)   . GI bleed   . HLD (hyperlipidemia)   . Weakness     PAST SURGICAL HISTORY: Past Surgical History:  Procedure Laterality Date  . ESOPHAGOGASTRODUODENOSCOPY Left 01/18/2015   Procedure: ESOPHAGOGASTRODUODENOSCOPY (EGD);  Surgeon: Wilford Corner, MD;  [facility] END[facility]COPY;  Service: Endoscopy;  Laterality: Left;  . INGUINAL HERNIA REPAIR Right 01/22/2015   Procedure: RIGHT INGUINAL LYMPH NODE BX;  Surgeon: Donnie Mesa, MD;  Location: [facility];  Service: General;  Laterality: Right;  . PORTACATH PLACEMENT  01/26/2015    power port with tip SVC/RA Junction  . SKIN SURGERY     Small benign cysts over left scalp removed    FAMILY HISTORY Family History  Problem Relation Age of Onset  . Hypertension Mother   . Hypertension Father   . Kidney failure Father   . Hypertension Sister   . Diabetes Sister   . Prostate cancer Brother   . Lupus Sister    The patient's father died from CHF complications age 45. The patient's mother is 20 y/o as of December 2016. The patient has 9 brothers, 8 sisters. One brother has prostate cancer. There is no other cancer history in the family to his knowledge.  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 His wife of 75 years, [name], is disabled due to RA. Daughter, [name] works as a Teacher, early years/pre for [employer] (she previously worked at the [employer] in records). Son, [name] owns a window washing business. Son, [name] is in Press photographer at [employer]. All live in [place]. The patient has 3 grandchildren. He attends a [employer].    ADVANCED DIRECTIVES: Not in place   HEALTH MAINTENANCE: Social History   Tobacco Use  . Smoking status: Never Smoker  . Smokeless tobacco: Never Used  Substance Use Topics  . Alcohol use: Yes    Comment: socially  . Drug use: No     Colonoscopy:  PSA: 2.27 on 01/22/2015  Hepatitis serologies (B surface antigen, hepatitis B core antibody and hepatitis C IgM) negative 01/18/2015  No Known Allergies  Current Outpatient Medications  Medication Sig Dispense Refill  . diazepam (VALIUM) 5 MG tablet Take 1 tab 1 hour before scan, may repeat x1 5 tablet 1  . Ibrutinib 420 MG TABS Take 420 mg by mouth daily. Take with a full glass of water at approximately the same time each day 84 tablet 4  . lisinopril-hydrochlorothiazide (PRINZIDE,ZESTORETIC) 20-25 MG tablet TAKE 1 TABLET BY MOUTH EVERY DAY 90 tablet 1  . lovastatin (MEVACOR) 20 MG tablet TAKE 1 TABLET BY MOUTH EVERYDAY AT BEDTIME 90 tablet 0   No current facility-administered medications for this  visit.     OBJECTIVE: Middle-aged African-American man in no acute distress There were no vitals filed for this visit.   There is no height or weight on file to calculate BMI.    ECOG FS:0 - Asymptomatic  Sclerae unicteric, pupils round and equal Oropharynx clear and moist No cervical or supraclavicular adenopathy, no axillary or inguinal adenoma Lungs no rales or rhonchi Heart regular rate and rhythm Abd soft, nontender, positive bowel sounds MSK no focal spinal tenderness, no upper extremity lymphedema Neuro: nonfocal, well oriented, appropriate affect  LAB  RESULTS:  CMP     Component Value Date/Time   NA 137 03/26/2017 1931   NA 140 01/16/2017 0837   K 3.7 03/26/2017 1931   K 3.9 01/16/2017 0837   CL 101 03/26/2017 1931   CO2 24 03/26/2017 1931   CO2 24 01/16/2017 0837   GLUCOSE 90 03/26/2017 1931   GLUCOSE 102 01/16/2017 0837   BUN 19 03/26/2017 1931   BUN 20.4 01/16/2017 0837   CREATININE 1.19 03/26/2017 1931   CREATININE 1.2 01/16/2017 0837   CALCIUM 9.0 03/26/2017 1931   CALCIUM 8.9 01/16/2017 0837   PROT 7.1 03/26/2017 1931   PROT 6.9 01/16/2017 0837   ALBUMIN 4.2 03/26/2017 1931   ALBUMIN 4.0 01/16/2017 0837   AST 27 03/26/2017 1931   AST 20 01/16/2017 0837   ALT 26 03/26/2017 1931   ALT 22 01/16/2017 0837   ALKPHOS 37 (L) 03/26/2017 1931   ALKPHOS 47 01/16/2017 0837   BILITOT 0.7 03/26/2017 1931   BILITOT 0.36 01/16/2017 0837   GFRNONAA >60 03/26/2017 1931   GFRAA >60 03/26/2017 1931    INo results found for: SPEP, UPEP  Lab Results  Component Value Date   WBC 5.4 03/26/2017   NEUTROABS 3.2 03/26/2017   HGB 11.3 (L) 03/26/2017   HCT 35.2 (L) 03/26/2017   MCV 83.6 03/26/2017   PLT 370 03/26/2017      Chemistry      Component Value Date/Time   NA 137 03/26/2017 1931   NA 140 01/16/2017 0837   K 3.7 03/26/2017 1931   K 3.9 01/16/2017 0837   CL 101 03/26/2017 1931   CO2 24 03/26/2017 1931   CO2 24 01/16/2017 0837   BUN 19 03/26/2017 1931   BUN 20.4 01/16/2017 0837   CREATININE 1.19 03/26/2017 1931   CREATININE 1.2 01/16/2017 0837      Component Value Date/Time   CALCIUM 9.0 03/26/2017 1931   CALCIUM 8.9 01/16/2017 0837   ALKPHOS 37 (L) 03/26/2017 1931   ALKPHOS 47 01/16/2017 0837   AST 27 03/26/2017 1931   AST 20 01/16/2017 0837   ALT 26 03/26/2017 1931   ALT 22 01/16/2017 0837   BILITOT 0.7 03/26/2017 1931   BILITOT 0.36 01/16/2017 0837       No results found for: LABCA2  No components found for: LABCA125  No results for input(s): INR in the last 168 hours.  Urinalysis      Component Value Date/Time   COLORURINE YELLOW 01/23/2015 0805   APPEARANCEUR CLEAR 01/23/2015 0805   LABSPEC 1.014 01/23/2015 0805   PHURINE 7.5 01/23/2015 0805   GLUCOSEU NEGATIVE 01/23/2015 0805   HGBUR NEGATIVE 01/23/2015 0805   BILIRUBINUR NEGATIVE 01/23/2015 0805   KETONESUR NEGATIVE 01/23/2015 0805   PROTEINUR NEGATIVE 01/23/2015 0805   NITRITE NEGATIVE 01/23/2015 0805   LEUKOCYTESUR NEGATIVE 01/23/2015 0805    STUDIES: Nm Pet Image Restag (ps) Skull Base To Thigh  Result Date: 04/20/2017 CLINICAL  DATA:  Subsequent treatment strategy for mantle cell lymphoma. EXAM: NUCLEAR MEDICINE PET SKULL BASE TO THIGH TECHNIQUE: 10.6 mCi F-18 FDG was injected intravenously. Full-ring PET imaging was performed from the skull base to thigh after the radiotracer. CT data was obtained and used for attenuation correction and anatomic localization. Fasting blood glucose: 98 mg/dl Mediastinal blood pool activity: SUV max 0.6 COMPARISON:  Multiple prior PET CTs.  The most recent is 02/02/2017 FINDINGS: NECK: No hypermetabolic lymph nodes in the neck. Significant motion artifact. Incidental CT findings: none CHEST: Progressive mediastinal and hilar lymphadenopathy. 19.5 mm pretracheal lymph node on image number 64 previously measured 9.5 mm. SUV max is 15.4 and was previously 7.8. Right hilar node is difficult to measure without contrast but has definitely enlarged and SUV max is 19.6 and was previously 9.8. Subcarinal nodal mass measures 19.5 mm on image number 69 and previously measured 13.5 mm. SUV max is 18.9 and was previously 6.6. No worrisome pulmonary lesions. Incidental CT findings: none ABDOMEN/PELVIS: No abnormal hypermetabolic activity within the liver, pancreas, adrenal glands, or spleen. No hypermetabolic lymph nodes in the abdomen or pelvis. No evidence of recurrent disease in the abdomen/pelvis. Incidental CT findings: none SKELETON: Diffuse marrow activity likely due to rebound from chemotherapy  or marrow stimulating drugs. Incidental CT findings: none IMPRESSION: 1. Progressive hypermetabolic mediastinal and hilar lymphadenopathy as detailed above. 2. No findings for recurrent disease in the abdomen. Electronically Signed   By: Marijo Sanes M.D.   On: 04/20/2017 12:12    Pet scan on 02/02/2017 revealing: Slightly progressive mediastinal and hilar lymphadenopathy a No neck, supraclavicular, axillary, abdominal/pelvic or inguinal lymphadenopathy. No osseous involvement   ASSESSMENT: 67 y.o. [place] man with a diagnosis of mantle cell non-Hodgkin's lymphoma of extranodal and solid organ sites presenting with syncope secondary to bleeding from a large ulcerated duodenal ulcer  (a) s/p coil embolization of the feeding (gastro-duodenal) artery 01/23/2015  (b) anemia--scant iron stores on bone marrow biopsy--s/p feraheme 02/13/2015  (1) right inguinal lymph node biopsy 01/22/2015 confirms mantle cell lymphoma  (a) bone marrow biopsy 02/06/2015 positive for involvement by the patient's mantle cell lymphoma  (b) IPI score of 5 (high risk) predicts a 5 year progression free survival of 50% with CHOP-Rituxan chemotherapy  (c) MIPI score of 5 (intermediate risk) predicts a median survival of 58 months  (2) CHOP/Rituxan started 02/09/2015, completed 8 cycles 07/05/2015  (3)  [facility] consultation 04/06/2015. Patient opted against transplant consolidation  (4) rituximab maintenance started 08/16/2015, repeat every 2 months  (5) PSA increase noted July 2017, back to baseline by December 2017  (6) restaging PET scan 11/17/2016 is consistent with disease progression  (a) repeat PET scan 02/02/2017 confirms   (7) ibrutinib 420 mg daily started 02/09/2017  PLAN: [name] Is tolerating the ibrutinib remarkably well his hemoglobin is slightly improved.  He actually feels he has more energy although he does say by the time he gets to bed he falls asleep immediately and sleeps right through the  night.  The plan is to continue both the right toxin and ibrutinib for now.  He [name] receive Rituxan today and 2 months from now.  He [name] see me with that dose.  A few days before that he [name] have a repeat PET scan.  If he is having a response particularly if he has a complete response, we [name] stop the rituximab and continue the ibrutinib alone.  Otherwise we [name] have to reassess  Today we reviewed "B" symptoms and he  knows to call for any other problems that may develop before his next visit.   Magrinat, Virgie Dad, MD  04/21/17 8:06 AM Medical Oncology and Hematology [facility] [address] [place], [place] [address] Tel. [phone]    Fax. [phone]  This document serves as a record of services personally performed by Lurline Del, MD. It was created on his behalf by Sheron Nightingale, a trained medical scribe. The creation of this record is based on the scribe's personal observations and the provider's statements to them.   I have reviewed the above documentation for accuracy and completeness, and I agree with the above.

## 2017-04-21 NOTE — Progress Notes (Signed)
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 He "blew this off" and it did not happen again until 01/17/2015 when he felt weak at work and later fainted at home. He was brought to the ED where he was found to be guaiac positive (denies melena before that date) with a Hb of 4.5 MCV 82.2, platelets 295K, WBC 9.1 and normal INR and PTT. He was admitted and underwent EGD under Dr Michail Sermon 01/18/2015 showing a very large duodenal ulcer w clot. Abdominal US 01/19/2015 was not very informative but CT abd/pelvis 12/17,2016 showed adenopathy in the anterior mediastinum, retroperitoneum and pelvis, with a large ulcerated mass involving the distal stomach/proximal duodenum and extending into the gastrohepatic region.   On 01/22/2015 he underwent right inguinal lymph node biopsy, consistent with mantle cell lymphoma (([id number] and [id number])). Specifically the lymph node showed effacement of the architecture by sheets of small to medium sized lymphocytes with no apparent nodularity. The cells were positive for CD20, CD5, BCL-2, CD21, and cyclin D1. He 67  was low (10-20%). CD21 revealed a few residual germinal centers area at the B cells were negative for CD10, and BCL-6. CD23 was negative.  On 02/06/2015 the patient underwent a bone marrow aspirate and biopsy. The pathology from this procedure (FZB 17-2 and 17-7) was positive for involvement by non-Hodgkin's lymphoma with numerous atypical interstitial and paratrabecular lymphoid aggregates consistent with mantle cell lymphoma. Iron stain showed scant iron flow cytometry confirmed a monoclonal B-cell population, lambda restricted, consistent with the patient's known diagnosis of mantle cell lymphoma.  On 01/18/2015 the patient underwent EGD which found a massive duodenal bulb ulcer with adherent clot, although no active bleeding.his hemoglobin was in the 8.4 range and stable. He was discharged on 01/22/2015, but within 24 hours was readmitted with syncope and altered mental status. He had aspirated and required intubation for his respiratory failure. Hemoglobin was down to 6.3. An NG tube was placed, the patient was multiply transfused, and Surgery was considered but felt not to be feasible.accordingly the patient underwent arteriography 01/23/2015 which found the superior mesenteric and celiac arteries not to be the involved vessels. The gastroduodenal artery showed multiple branches providing supply to the hypervascular duodenal mass, and this artery was coil embolized.. Also a port was placed at the time of this procedure with a view to eventual chemotherapy.  He was discharged 01/26/2015,but required readmission 02/07/2015 for another presyncopal episode. He presented to the emergency room where he was found to have a hemoglobin of 7.5. The patient was transfusedIIA hemoglobin of 10 and was discharged 65/06/2014. He was scheduled to start CHOP right toxin 65/07/2014.  His subsequent history is as detailed below.  INTERVAL HISTORY:  Will returns today for further follow-up and treatment of his  mantle  cell non-Hodgkin's lymphoma. He continues on rituximab and ibrutinib, with good tolerance. He notes some blood when he blows his nose. He denies bruising or other bleeding issues.   However, there is some evidence of disease progression.  He completed a PET scan on 04/20/2017 showing: Progressive hypermetabolic mediastinal and hilar lymphadenopathy.  An index lymph node has increased from about 10 mm to about 20 mm.  No findings for recurrent disease in the abdomen  REVIEW OF SYSTEMS: Will reports that he is doing well. He works so much that he sleeps well at night. His granddaughter, who is 3, returned from visiting [place]. He denies unusual headaches, visual changes, nausea, vomiting, or dizziness. There has been no unusual cough, phlegm production, or pleurisy. This been no change in bowel or bladder habits. He denies unexplained fatigue or unexplained weight loss, bleeding, rash, or fever. A detailed review of systems was otherwise stable.    PAST MEDICAL HISTORY: Past Medical History:  Diagnosis Date  . Anemia   . Cancer ([place])    Lymphoma  . Essential hypertension   . GERD (gastroesophageal reflux disease)   . GI bleed   . HLD (hyperlipidemia)   . Weakness     PAST SURGICAL HISTORY: Past Surgical History:  Procedure Laterality Date  . ESOPHAGOGASTRODUODENOSCOPY Left 01/18/2015   Procedure: ESOPHAGOGASTRODUODENOSCOPY (EGD);  Surgeon: Wilford Corner, MD;  Location: [facility] ENDOSCOPY;  Service: Endoscopy;  Laterality: Left;  . INGUINAL HERNIA REPAIR Right 01/22/2015   Procedure: RIGHT INGUINAL LYMPH NODE BX;  Surgeon: Donnie Mesa, MD;  Location: [facility];  Service: General;  Laterality: Right;  . PORTACATH PLACEMENT  01/26/2015    power port with tip SVC/RA Junction  . SKIN SURGERY     Small benign cysts over left scalp removed    FAMILY HISTORY Family History  Problem Relation Age of Onset  . Hypertension Mother   . Hypertension Father   . Kidney failure Father   .  Hypertension Sister   . Diabetes Sister   . Prostate cancer Brother   . Lupus Sister    The patient's father died from CHF complications age 22. The patient's mother is 23 y/o as of December 2016. The patient has 9 brothers, 8 sisters. One brother has prostate cancer. There is no other cancer history in the family to his knowledge.  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 His wife of 44 years, [name], is disabled due to RA. Daughter, [name] works as a Teacher, early years/pre for [employer] (she previously worked at the [employer] in records). Son, [name] owns a window washing business. Son, [name] is in Press photographer at [employer]. All live in [place]. The patient has 3 grandchildren. He attends a [employer].    ADVANCED DIRECTIVES: Not in place   HEALTH MAINTENANCE: Social History   Tobacco Use  . Smoking status: Never Smoker  . Smokeless tobacco: Never Used  Substance Use Topics  . Alcohol use: Yes    Comment: socially  . Drug use: No     Colonoscopy:  PSA: 2.27 on 01/22/2015  Hepatitis serologies (B surface antigen, hepatitis B core antibody and hepatitis C IgM) negative 01/18/2015  No Known Allergies  Current Outpatient Medications  Medication Sig Dispense Refill  . diazepam (VALIUM) 5 MG tablet Take 1 tab 1 hour before scan, may repeat x1 5 tablet 1  . Ibrutinib 420 MG TABS Take 420 mg by mouth  daily. Take with a full glass of water at approximately the same time each day 84 tablet 4  . lisinopril-hydrochlorothiazide (PRINZIDE,ZESTORETIC) 20-25 MG tablet TAKE 1 TABLET BY MOUTH EVERY DAY 90 tablet 1  . lovastatin (MEVACOR) 20 MG tablet TAKE 1 TABLET BY MOUTH EVERYDAY AT BEDTIME 90 tablet 0   No current facility-administered medications for this visit.     OBJECTIVE: Middle-aged African-American man who appears well  Vitals:   04/23/17 1106  BP: 126/84  Pulse: 68  Resp: 18  Temp: 97.8 F (36.6 C)  SpO2: 98%      Body mass index is 29.92 kg/m.    ECOG FS:0 - Asymptomatic  Sclerae unicteric, EOMs intact Oropharynx clear and moist No cervical or supraclavicular adenopathy, no axillary or inguinal adenopathy Lungs no rales or rhonchi Heart regular rate and rhythm Abd soft, nontender, positive bowel sounds, no splenomegaly MSK no focal spinal tenderness, no upper extremity lymphedema Neuro: nonfocal, well oriented, appropriate affect   LAB RESULTS:  CMP     Component Value Date/Time   NA 140 04/23/2017 1040   NA 140 01/16/2017 0837   K 4.1 04/23/2017 1040   K 3.9 01/16/2017 0837   CL 102 04/23/2017 1040   CO2 28 04/23/2017 1040   CO2 24 01/16/2017 0837   GLUCOSE 67 (L) 04/23/2017 1040   GLUCOSE 102 01/16/2017 0837   BUN 18 04/23/2017 1040   BUN 20.4 01/16/2017 0837   CREATININE 1.23 04/23/2017 1040   CREATININE 1.2 01/16/2017 0837   CALCIUM 9.4 04/23/2017 1040   CALCIUM 8.9 01/16/2017 0837   PROT 6.9 04/23/2017 1040   PROT 6.9 01/16/2017 0837   ALBUMIN 3.9 04/23/2017 1040   ALBUMIN 4.0 01/16/2017 0837   AST 19 04/23/2017 1040   AST 20 01/16/2017 0837   ALT 22 04/23/2017 1040   ALT 22 01/16/2017 0837   ALKPHOS 34 (L) 04/23/2017 1040   ALKPHOS 47 01/16/2017 0837   BILITOT 0.5 04/23/2017 1040   BILITOT 0.36 01/16/2017 0837   GFRNONAA >60 04/23/2017 1040   GFRAA >60 04/23/2017 1040    INo results found for: SPEP, UPEP  Lab Results  Component Value Date   WBC 5.3 04/23/2017   NEUTROABS 3.5 04/23/2017   HGB 12.2 (L) 04/23/2017   HCT 37.9 (L) 04/23/2017   MCV 85.4 04/23/2017   PLT 335 04/23/2017      Chemistry      Component Value Date/Time   NA 140 04/23/2017 1040   NA 140 01/16/2017 0837   K 4.1 04/23/2017 1040   K 3.9 01/16/2017 0837   CL 102 04/23/2017 1040   CO2 28 04/23/2017 1040   CO2 24 01/16/2017 0837   BUN 18 04/23/2017 1040   BUN 20.4 01/16/2017 0837   CREATININE 1.23 04/23/2017 1040   CREATININE 1.2 01/16/2017 0837      Component Value Date/Time    CALCIUM 9.4 04/23/2017 1040   CALCIUM 8.9 01/16/2017 0837   ALKPHOS 34 (L) 04/23/2017 1040   ALKPHOS 47 01/16/2017 0837   AST 19 04/23/2017 1040   AST 20 01/16/2017 0837   ALT 22 04/23/2017 1040   ALT 22 01/16/2017 0837   BILITOT 0.5 04/23/2017 1040   BILITOT 0.36 01/16/2017 0837       No results found for: LABCA2  No components found for: LABCA125  No results for input(s): INR in the last 168 hours.  Urinalysis    Component Value Date/Time   COLORURINE YELLOW 01/23/2015 0805   APPEARANCEUR CLEAR  01/23/2015 0805   LABSPEC 1.014 01/23/2015 0805   PHURINE 7.5 01/23/2015 0805   GLUCOSEU NEGATIVE 01/23/2015 0805   HGBUR NEGATIVE 01/23/2015 0805   BILIRUBINUR NEGATIVE 01/23/2015 0805   KETONESUR NEGATIVE 01/23/2015 0805   PROTEINUR NEGATIVE 01/23/2015 0805   NITRITE NEGATIVE 01/23/2015 0805   LEUKOCYTESUR NEGATIVE 01/23/2015 0805    STUDIES: Nm Pet Image Restag (ps) Skull Base To Thigh  Result Date: 04/20/2017 CLINICAL DATA:  Subsequent treatment strategy for mantle cell lymphoma. EXAM: NUCLEAR MEDICINE PET SKULL BASE TO THIGH TECHNIQUE: 10.6 mCi F-18 FDG was injected intravenously. Full-ring PET imaging was performed from the skull base to thigh after the radiotracer. CT data was obtained and used for attenuation correction and anatomic localization. Fasting blood glucose: 98 mg/dl Mediastinal blood pool activity: SUV max 0.6 COMPARISON:  Multiple prior PET CTs.  The most recent is 02/02/2017 FINDINGS: NECK: No hypermetabolic lymph nodes in the neck. Significant motion artifact. Incidental CT findings: none CHEST: Progressive mediastinal and hilar lymphadenopathy. 19.5 mm pretracheal lymph node on image number 64 previously measured 9.5 mm. SUV max is 15.4 and was previously 7.8. Right hilar node is difficult to measure without contrast but has definitely enlarged and SUV max is 19.6 and was previously 9.8. Subcarinal nodal mass measures 19.5 mm on image number 69 and previously  measured 13.5 mm. SUV max is 18.9 and was previously 6.6. No worrisome pulmonary lesions. Incidental CT findings: none ABDOMEN/PELVIS: No abnormal hypermetabolic activity within the liver, pancreas, adrenal glands, or spleen. No hypermetabolic lymph nodes in the abdomen or pelvis. No evidence of recurrent disease in the abdomen/pelvis. Incidental CT findings: none SKELETON: Diffuse marrow activity likely due to rebound from chemotherapy or marrow stimulating drugs. Incidental CT findings: none IMPRESSION: 1. Progressive hypermetabolic mediastinal and hilar lymphadenopathy as detailed above. 2. No findings for recurrent disease in the abdomen. Electronically Signed   By: Marijo Sanes M.D.   On: 04/20/2017 12:12    Pet scan on 02/02/2017 revealing: Slightly progressive mediastinal and hilar lymphadenopathy a No neck, supraclavicular, axillary, abdominal/pelvic or inguinal lymphadenopathy. No osseous involvement   ASSESSMENT: 67 y.o. 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 presenting with syncope secondary to bleeding from a large ulcerated duodenal ulcer  (a) s/p coil embolization of the feeding (gastro-duodenal) artery 01/23/2015  (b) anemia--scant iron stores on bone marrow biopsy--s/p feraheme 02/13/2015  (1) right inguinal lymph node biopsy 01/22/2015 confirms mantle cell lymphoma  (a) bone marrow biopsy 02/06/2015 positive for involvement by the patient's mantle cell lymphoma  (b) IPI score of 5 (high risk) predicts a 5 year progression free survival of 50% with CHOP-Rituxan chemotherapy  (c) MIPI score of 5 (intermediate risk) predicts a median survival of 58 months  (2) CHOP/Rituxan started 02/09/2015, completed 8 cycles 07/05/2015  (3)  [facility] consultation 04/06/2015. Patient opted against transplant consolidation  (4) rituximab maintenance started 08/16/2015, repeat every 2 months, discontinued February 2019  (5) PSA increase  noted July 2017, back to baseline by December 2017  (6) restaging PET scan 11/17/2016 is consistent with disease progression  (a) repeat PET scan 02/02/2017 confirms   (7) ibrutinib 420 mg daily started 02/09/2017, discontinued March 2019  PLAN: Will is tolerating his treatment well, aside from the mild epistaxis which is common with ibrutinib.  However there has been evidence of disease progression.  At this point I think it will be important to confirm that we are dealing with his recurrent mantle cell  lymphoma, and not with transformed disease or a different problem altogether.  Initially on his first recurrence we did not biopsy because a trial of therapy seemed very reasonable.  Now that we have some evidence of progression however I think we need to proceed to mediastinoscopy and I am referring her to thoracic surgery with that in mind.  Hopefully that can be done in the next week or.  If this does show mantle cell lymphoma as we expect, then we discussed bendamustine and obinutuzumab.  He has a good understanding of the possible toxicities, side effects and complications of these agents and particularly the high risk of a reaction with the first dose of the antibody.  This greatly decreases with subsequent treatments and after dose #3 it is not expected to occur  The plan then would be for 4-6 months of this drug combination after which we would consider continuing the antibody alone indefinitely for maintenance.  He understands this is very likely to get him back into remission but that it is not going to be a cure.  Mantle cell lymphoma generally does recur.  The only way to cure that we have is transplantation.  He has met with the transplant team before and has decided against it because of the risk of death from that very intense treatment.  He also thinks, not unreasonably, but possibly in the next few years there will be a new treatment that might cure this or at least prolong  remissions indefinitely, and he would hate to die from a transplant only to have missed that opportunity.  Ensured at this point he does not want to see the transplant team again  He will return on May 11, 2017, see me that morning, and we will discuss how to take his supportive medications at that point.  He will start his treatments then.  He knows to call for any other issues that may develop before the next visit.  Evalee Gerard, Virgie Dad, MD  04/23/17 11:27 AM Medical Oncology and Hematology [facility] [address] [place], [place] [address] Tel. [phone]    Fax. [phone]  This document serves as a record of services personally performed by Lurline Del, MD. It was created on his behalf by Sheron Nightingale, a trained medical scribe. The creation of this record is based on the scribe's personal observations and the provider's statements to them.   I have reviewed the above documentation for accuracy and completeness, and I agree with the above.

## 2017-04-22 NOTE — Progress Notes (Signed)
START OFF PATHWAY REGIMEN - Lymphoma and CLL   [id number]:Bendamustine + Rituximab (100/375) q21 Days:   A cycle is every 21 days:     Bendamustine      Rituximab   **Always confirm dose/schedule in your pharmacy ordering system**    Patient Characteristics: Mantle Cell Lymphoma, Second Line Disease Type: Not Applicable Disease Type: Mantle Cell Lymphoma Disease Type: Not Applicable Line of Therapy: Second Line [place] Stage: III Intent of Therapy: Non-Curative / Palliative Intent, Discussed with Patient

## 2017-04-23 ENCOUNTER — Inpatient Hospital Stay: Payer: BLUE CROSS/BLUE SHIELD

## 2017-04-23 ENCOUNTER — Inpatient Hospital Stay: Payer: BLUE CROSS/BLUE SHIELD | Attending: Hematology and Oncology | Admitting: Oncology

## 2017-04-23 ENCOUNTER — Telehealth: Payer: Self-pay | Admitting: Oncology

## 2017-04-23 VITALS — BP 126/84 | HR 68 | Temp 97.8°F | Resp 18 | Ht 70.0 in | Wt 208.5 lb

## 2017-04-23 DIAGNOSIS — C8315 Mantle cell lymphoma, lymph nodes of inguinal region and lower limb: Secondary | ICD-10-CM | POA: Diagnosis not present

## 2017-04-23 DIAGNOSIS — C8319 Mantle cell lymphoma, extranodal and solid organ sites: Secondary | ICD-10-CM | POA: Insufficient documentation

## 2017-04-23 DIAGNOSIS — K219 Gastro-esophageal reflux disease without esophagitis: Secondary | ICD-10-CM | POA: Insufficient documentation

## 2017-04-23 DIAGNOSIS — E785 Hyperlipidemia, unspecified: Secondary | ICD-10-CM | POA: Diagnosis not present

## 2017-04-23 DIAGNOSIS — C8310 Mantle cell lymphoma, unspecified site: Secondary | ICD-10-CM

## 2017-04-23 DIAGNOSIS — I10 Essential (primary) hypertension: Secondary | ICD-10-CM | POA: Insufficient documentation

## 2017-04-23 DIAGNOSIS — Z79899 Other long term (current) drug therapy: Secondary | ICD-10-CM | POA: Diagnosis not present

## 2017-04-23 DIAGNOSIS — Z9221 Personal history of antineoplastic chemotherapy: Secondary | ICD-10-CM | POA: Diagnosis not present

## 2017-04-23 LAB — COMPREHENSIVE METABOLIC PANEL
ALK PHOS: 34 U/L — AB (ref 40–150)
ALT: 22 U/L (ref 0–55)
AST: 19 U/L (ref 5–34)
Albumin: 3.9 g/dL (ref 3.5–5.0)
Anion gap: 10 (ref 3–11)
BILIRUBIN TOTAL: 0.5 mg/dL (ref 0.2–1.2)
BUN: 18 mg/dL (ref 7–26)
CALCIUM: 9.4 mg/dL (ref 8.4–10.4)
CHLORIDE: 102 mmol/L (ref 98–109)
CO2: 28 mmol/L (ref 22–29)
CREATININE: 1.23 mg/dL (ref 0.70–1.30)
GFR calc Af Amer: >60 mL/min (ref >60)
Glucose, Bld: 67 mg/dL — ABNORMAL LOW (ref 70–140)
Potassium: 4.1 mmol/L (ref 3.5–5.1)
Sodium: 140 mmol/L (ref 136–145)
Total Protein: 6.9 g/dL (ref 6.4–8.3)

## 2017-04-23 LAB — CBC WITH DIFFERENTIAL/PLATELET
BASOS ABS: 0 10*3/uL (ref 0.0–0.1)
Basophils Relative: 0 %
Eosinophils Absolute: 0 10*3/uL (ref 0.0–0.5)
Eosinophils Relative: 1 %
HEMATOCRIT: 37.9 % — AB (ref 38.4–49.9)
HEMOGLOBIN: 12.2 g/dL — AB (ref 13.0–17.1)
LYMPHS PCT: 14 %
Lymphs Abs: 0.8 10*3/uL — ABNORMAL LOW (ref 0.9–3.3)
MCH: 27.5 pg (ref 27.2–33.4)
MCHC: 32.2 g/dL (ref 32.0–36.0)
MCV: 85.4 fL (ref 79.3–98.0)
Monocytes Absolute: 1 10*3/uL — ABNORMAL HIGH (ref 0.1–0.9)
Monocytes Relative: 19 %
NEUTROS PCT: 66 %
Neutro Abs: 3.5 10*3/uL (ref 1.5–6.5)
PLATELETS: 335 10*3/uL (ref 140–400)
RBC: 4.44 MIL/uL (ref 4.20–5.82)
RDW: 14.4 % (ref 11.0–14.6)
WBC: 5.3 10*3/uL (ref 4.0–10.3)

## 2017-04-23 LAB — LACTATE DEHYDROGENASE: LDH: 232 U/L (ref 125–245)

## 2017-04-23 NOTE — Telephone Encounter (Signed)
Gave avs and calendar waiting for 4/8

## 2017-04-24 ENCOUNTER — Encounter: Payer: Self-pay | Admitting: *Deleted

## 2017-04-24 LAB — BETA 2 MICROGLOBULIN, SERUM: Beta-2 Microglobulin: 1.5 mg/L (ref 0.6–2.4)

## 2017-04-28 ENCOUNTER — Other Ambulatory Visit: Payer: Self-pay

## 2017-04-28 DIAGNOSIS — E785 Hyperlipidemia, unspecified: Secondary | ICD-10-CM

## 2017-04-28 MED ORDER — LOVASTATIN 20 MG PO TABS: ORAL_TABLET | ORAL | 1 refills | Status: DC

## 2017-05-01 ENCOUNTER — Encounter: Payer: Self-pay | Admitting: Cardiothoracic Surgery

## 2017-05-01 ENCOUNTER — Institutional Professional Consult (permissible substitution): Payer: BLUE CROSS/BLUE SHIELD | Admitting: Cardiothoracic Surgery

## 2017-05-01 ENCOUNTER — Other Ambulatory Visit: Payer: Self-pay

## 2017-05-01 ENCOUNTER — Other Ambulatory Visit: Payer: Self-pay | Admitting: *Deleted

## 2017-05-01 VITALS — BP 116/78 | HR 99 | Resp 18 | Ht 70.0 in | Wt 203.4 lb

## 2017-05-01 DIAGNOSIS — R591 Generalized enlarged lymph nodes: Secondary | ICD-10-CM

## 2017-05-01 DIAGNOSIS — D165 Benign neoplasm of lower jaw bone: Secondary | ICD-10-CM

## 2017-05-01 DIAGNOSIS — J9859 Other diseases of mediastinum, not elsewhere classified: Secondary | ICD-10-CM | POA: Diagnosis not present

## 2017-05-01 DIAGNOSIS — R599 Enlarged lymph nodes, unspecified: Secondary | ICD-10-CM

## 2017-05-01 NOTE — Progress Notes (Signed)
[address]       ,[place] [address]             [phone]                    Wayland R Jentsch [facility] Medical Record #[id number] Date of Birth: 1950-04-11  Referring: Magrinat, Virgie Dad, MD Primary Care: Shelda Pal, DO Primary Cardiologist: No primary care provider on file.  Chief Complaint:    Chief Complaint  Patient presents with  . Mediastinal Mass    New patient, discuss surgery, PET 04/20/2017    History of Present Illness:    [name] 67 y.o. male is seen in the office  today for consideration of mediastinal biopsy.  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  CURRENT TREATMENT: Maintenance rituximab; added ibrutinib 02/09/2017  Serial PET scans have showed progressive recurrent mediastinal involvement.        Current Activity/ Functional Status:  Patient is independent with mobility/ambulation, transfers, ADL's, IADL's.   Zubrod Score: At the time of surgery this patient's most appropriate activity status/level should be described as: [x]     0    Normal activity, no symptoms []     1    Restricted in physical strenuous activity but ambulatory, able to do out light work []     2    Ambulatory and capable of self care, unable to do work activities, up and about               >50 % of waking hours                              []     3    Only limited self care, in bed greater than 50% of waking hours []     4    Completely disabled, no self care, confined to bed or chair []     5    Moribund   Past Medical History:  Diagnosis Date  . Anemia   . Cancer ([place])    Lymphoma  . Essential hypertension   . GERD (gastroesophageal reflux disease)   . GI bleed   . HLD (hyperlipidemia)   . Weakness     Past Surgical History:  Procedure Laterality Date  . ESOPHAGOGASTRODUODENOSCOPY Left 01/18/2015   Procedure:  ESOPHAGOGASTRODUODENOSCOPY (EGD);  Surgeon: Wilford Corner, MD;  Location: [facility] ENDOSCOPY;  Service: Endoscopy;  Laterality: Left;  . INGUINAL HERNIA REPAIR Right 01/22/2015   Procedure: RIGHT INGUINAL LYMPH NODE BX;  Surgeon: Donnie Mesa, MD;  Location: [place];  Service: General;  Laterality: Right;  . PORTACATH PLACEMENT  01/26/2015    power port with tip SVC/RA [place]  . SKIN SURGERY     Small benign cysts over left scalp removed    Family History  Problem Relation Age of Onset  . Hypertension Mother   . Hypertension Father   . Kidney failure Father   . Hypertension Sister   . Diabetes Sister   . Prostate cancer Brother   . Lupus Sister     Social History   Socioeconomic History  . Marital status: Married    Spouse name: Not on file  . Number of children: Not on file  . Years of education: Not on file  . Highest education level:  Not on file  Occupational History  . Not on file  Social Needs  . Financial resource strain: Not on file  . Food insecurity:    Worry: Not on file    Inability: Not on file  . Transportation needs:    Medical: Not on file    Non-medical: Not on file  Tobacco Use  . Smoking status: Never Smoker  . Smokeless tobacco: Never Used  Substance and Sexual Activity  . Alcohol use: Yes    Comment: occasional beer  . Drug use: No  . Sexual activity: Not on file  Lifestyle  . Physical activity:    Days per week: Not on file    Minutes per session: Not on file  . Stress: Not on file  Relationships  . Social connections:    Talks on phone: Not on file    Gets together: Not on file    Attends religious service: Not on file    Active member of club or organization: Not on file    Attends meetings of clubs or organizations: Not on file    Relationship status: Not on file  . Intimate partner violence:    Fear of current or ex partner: Not on file    Emotionally abused: Not on file    Physically abused: Not on file    Forced sexual activity:  Not on file  Other Topics Concern  . Not on file  Social History Narrative  . Not on file    Social History   Tobacco Use  Smoking Status Never Smoker  Smokeless Tobacco Never Used    Social History   Substance and Sexual Activity  Alcohol Use Yes   Comment: occasional beer     No Known Allergies  Current Outpatient Medications  Medication Sig Dispense Refill  . diazepam (VALIUM) 5 MG tablet Take 1 tab 1 hour before scan, may repeat x1 5 tablet 1  . Ibrutinib 420 MG TABS Take 420 mg by mouth daily. Take with a full glass of water at approximately the same time each day (Patient not taking: Reported on 05/04/2017) 84 tablet 4  . lisinopril-hydrochlorothiazide (PRINZIDE,ZESTORETIC) 20-25 MG tablet TAKE 1 TABLET BY MOUTH EVERY DAY 90 tablet 1  . lovastatin (MEVACOR) 20 MG tablet TAKE 1 TABLET BY MOUTH EVERYDAY AT BEDTIME (Patient taking differently: Take 20 mg by mouth at bedtime. TAKE 1 TABLET BY MOUTH EVERYDAY AT BEDTIME) 90 tablet 1  . naproxen sodium (ALEVE) 220 MG tablet Take 440 mg by mouth daily as needed (general pain).     No current facility-administered medications for this visit.      Review of Systems:     Cardiac Review of Systems: [Y] = yes  or   [  ] = no   Chest Pain [  n  ]  Resting SOB [ n  ] Exertional SOB  [n  ]  Orthopnea Florencio.Farrier  ]   Pedal Edema [ n  ]    Palpitations [  n] Syncope  [ n ]   Presyncope [n   ]  General Review of Systems: [Y] = yes [  ]=no Constitional: recent weight change [  ];  Wt loss over the last 3 months [   ] anorexia [  ]; fatigue Blue.Reese  ]; nausea [ n ]; night sweats [n  ]; fever [n  ]; or chills [ n ];          Dental: poor dentition[  ];  Last Dentist visit:   Eye : blurred vision [  ]; diplopia [   ]; vision changes [  ];  Amaurosis fugax[  ]; Resp: cough [  ];  wheezing[  ];  hemoptysis[  ]; shortness of breath[  ]; paroxysmal nocturnal dyspnea[  ]; dyspnea on exertion[  ]; or orthopnea[  ];  GI:  gallstones[  ], vomiting[  ];   dysphagia[  ]; melena[  ];  hematochezia [  ]; heartburn[  ];   Hx of  Colonoscopy[  ]; GU: kidney stones [  ]; hematuria[  ];   dysuria [  ];  nocturia[  ];  history of     obstruction [  ]; urinary frequency [  ]             Skin: rash, swelling[  ];, hair loss[  ];  peripheral edema[  ];  or itching[  ]; Musculosketetal: myalgias[  ];  joint swelling[  ];  joint erythema[  ];  joint pain[  ];  back pain[  ];  Heme/Lymph: bruising[  ];  bleeding[  ];  anemia[  ];  Neuro: TIA[  ];  headaches[  ];  stroke[  ];  vertigo[  ];  seizures[  ];   paresthesias[  ];  difficulty walking[  ];  Psych:depression[  ]; anxiety[  ];  Endocrine: diabetes[ n ];  thyroid dysfunction[ n ];  Immunizations: Flu up to date [  ]; Pneumococcal up to date [  ];  Other:  Physical Exam: BP 116/78 (BP Location: Left Arm, Patient Position: Sitting, Cuff Size: Large)   Pulse 99   Resp 18   Ht 5\' 10"  (1.778 m)   Wt 203 lb 6.4 oz (92.3 kg)   SpO2 98% Comment: RA  BMI 29.18 kg/m   PHYSICAL EXAMINATION: General appearance: alert, cooperative, appears stated age and no distress Head: Normocephalic, without obvious abnormality, atraumatic Neck: no adenopathy, no carotid bruit, no JVD, supple, symmetrical, trachea midline and thyroid not enlarged, symmetric, no tenderness/mass/nodules Lymph nodes: Cervical, supraclavicular, and axillary nodes normal. Resp: clear to auscultation bilaterally Back: symmetric, no curvature. ROM normal. No CVA tenderness. Cardio: regular rate and rhythm, S1, S2 normal, no murmur, click, rub or gallop GI: soft, non-tender; bowel sounds normal; no masses,  no organomegaly Extremities: extremities normal, atraumatic, no cyanosis or edema and Homans sign is negative, no sign of DVT Neurologic: Grossly normal  Diagnostic Studies & Laboratory data:     Recent Radiology Findings:   Dg Chest 2 View  Result Date: 05/04/2017 CLINICAL DATA:  67 year old male preoperative study. Mantle cell  lymphoma with progressive disease on PET-CT last month. EXAM: CHEST - 2 VIEW COMPARISON:  PET-CT 04/20/2017 and earlier. Chest radiographs in December 2016. FINDINGS: Right chest porta cath with no adverse features. Lung volumes and mediastinal contours are within normal limits; there is only mild hilar enlargement evident on the lateral view to correspond to the mediastinal and hilar lymphadenopathy seen by PET. Both lungs are clear. No pneumothorax or pleural effusion. No acute osseous abnormality identified. Upper abdominal embolization coils redemonstrated. Negative visible bowel gas pattern. IMPRESSION: No acute cardiopulmonary abnormality. Mediastinal and hilar lymphadenopathy better demonstrated by PET-CT. Electronically Signed   By: Genevie Ann M.D.   On: 05/04/2017 10:24   Nm Pet Image Restag (ps) Skull Base To Thigh  Result Date: 04/20/2017 CLINICAL DATA:  Subsequent treatment strategy for mantle cell lymphoma. EXAM: NUCLEAR MEDICINE PET SKULL BASE TO THIGH TECHNIQUE: 10.6 mCi  F-18 FDG was injected intravenously. Full-ring PET imaging was performed from the skull base to thigh after the radiotracer. CT data was obtained and used for attenuation correction and anatomic localization. Fasting blood glucose: 98 mg/dl Mediastinal blood pool activity: SUV max 0.6 COMPARISON:  Multiple prior PET CTs.  The most recent is 02/02/2017 FINDINGS: NECK: No hypermetabolic lymph nodes in the neck. Significant motion artifact. Incidental CT findings: none CHEST: Progressive mediastinal and hilar lymphadenopathy. 19.5 mm pretracheal lymph node on image number 64 previously measured 9.5 mm. SUV max is 15.4 and was previously 7.8. Right hilar node is difficult to measure without contrast but has definitely enlarged and SUV max is 19.6 and was previously 9.8. Subcarinal nodal mass measures 19.5 mm on image number 69 and previously measured 13.5 mm. SUV max is 18.9 and was previously 6.6. No worrisome pulmonary lesions.  Incidental CT findings: none ABDOMEN/PELVIS: No abnormal hypermetabolic activity within the liver, pancreas, adrenal glands, or spleen. No hypermetabolic lymph nodes in the abdomen or pelvis. No evidence of recurrent disease in the abdomen/pelvis. Incidental CT findings: none SKELETON: Diffuse marrow activity likely due to rebound from chemotherapy or marrow stimulating drugs. Incidental CT findings: none IMPRESSION: 1. Progressive hypermetabolic mediastinal and hilar lymphadenopathy as detailed above. 2. No findings for recurrent disease in the abdomen. Electronically Signed   By: Marijo Sanes M.D.   On: 04/20/2017 12:12   I have independently reviewed the above radiology studies  and reviewed the findings with the patient.   Recent Lab Findings: Lab Results  Component Value Date   WBC 4.0 05/04/2017   HGB 12.1 (L) 05/04/2017   HCT 38.0 (L) 05/04/2017   PLT 326 05/04/2017   GLUCOSE 100 (H) 05/04/2017   CHOL 143 01/18/2015   TRIG 107 01/23/2015   HDL 20 (L) 01/18/2015   LDLCALC 93 01/18/2015   ALT 25 05/04/2017   AST 25 05/04/2017   NA 139 05/04/2017   K 4.1 05/04/2017   CL 105 05/04/2017   CREATININE 1.00 05/04/2017   BUN 19 05/04/2017   CO2 24 05/04/2017   INR 0.93 05/04/2017      Assessment / Plan:   Patient with previous history of non-Hodgkin's lymphoma originally diagnosed as a duodenal mass, now presents with progressive mediastinal adenopathy hypermetabolic on PET scan.  Patient is referred for consideration of mediastinal biopsy to determine the cause of the mediastinal adenopathy.  I discussed with the patient proceeding with bronchoscopy ebus, and likely mediastinoscopy to to obtain adequate to achieve tissue for evaluation for lymphoma.  Risks and options were discussed with patient and he is agreeable with proceeding.        Grace Isaac MD      [address] [place] [address] Office [phone]   Beeper [phone]  05/04/2017 9:02  PM

## 2017-05-04 ENCOUNTER — Ambulatory Visit (HOSPITAL_COMMUNITY)
Admission: RE | Admit: 2017-05-04 | Discharge: 2017-05-04 | Disposition: A | Discharge status: Home / Self Care | Payer: BLUE CROSS/BLUE SHIELD | Source: Ambulatory Visit | Attending: Cardiothoracic Surgery | Admitting: Cardiothoracic Surgery

## 2017-05-04 ENCOUNTER — Encounter (HOSPITAL_COMMUNITY): Payer: Self-pay

## 2017-05-04 ENCOUNTER — Other Ambulatory Visit (HOSPITAL_COMMUNITY)
Admission: RE | Admit: 2017-05-04 | Discharge: 2017-05-04 | Disposition: A | Discharge status: Home / Self Care | Payer: BLUE CROSS/BLUE SHIELD | Source: Ambulatory Visit | Attending: Cardiothoracic Surgery | Admitting: Cardiothoracic Surgery

## 2017-05-04 ENCOUNTER — Other Ambulatory Visit: Payer: Self-pay

## 2017-05-04 ENCOUNTER — Encounter (HOSPITAL_COMMUNITY)
Admission: RE | Admit: 2017-05-04 | Discharge: 2017-05-04 | Disposition: A | Discharge status: Home / Self Care | Payer: BLUE CROSS/BLUE SHIELD | Source: Ambulatory Visit | Attending: Cardiothoracic Surgery | Admitting: Cardiothoracic Surgery

## 2017-05-04 DIAGNOSIS — R591 Generalized enlarged lymph nodes: Secondary | ICD-10-CM

## 2017-05-04 DIAGNOSIS — Z79899 Other long term (current) drug therapy: Secondary | ICD-10-CM | POA: Insufficient documentation

## 2017-05-04 DIAGNOSIS — I10 Essential (primary) hypertension: Secondary | ICD-10-CM | POA: Diagnosis not present

## 2017-05-04 DIAGNOSIS — Z01818 Encounter for other preprocedural examination: Secondary | ICD-10-CM | POA: Insufficient documentation

## 2017-05-04 DIAGNOSIS — Z01812 Encounter for preprocedural laboratory examination: Secondary | ICD-10-CM | POA: Insufficient documentation

## 2017-05-04 DIAGNOSIS — R599 Enlarged lymph nodes, unspecified: Secondary | ICD-10-CM

## 2017-05-04 DIAGNOSIS — E785 Hyperlipidemia, unspecified: Secondary | ICD-10-CM | POA: Insufficient documentation

## 2017-05-04 DIAGNOSIS — K219 Gastro-esophageal reflux disease without esophagitis: Secondary | ICD-10-CM | POA: Diagnosis not present

## 2017-05-04 DIAGNOSIS — Z01810 Encounter for preprocedural cardiovascular examination: Secondary | ICD-10-CM | POA: Insufficient documentation

## 2017-05-04 DIAGNOSIS — D649 Anemia, unspecified: Secondary | ICD-10-CM | POA: Diagnosis not present

## 2017-05-04 LAB — CBC
HCT: 38 % — ABNORMAL LOW (ref 39.0–52.0)
Hemoglobin: 12.1 g/dL — ABNORMAL LOW (ref 13.0–17.0)
MCH: 27.1 pg (ref 26.0–34.0)
MCHC: 31.8 g/dL (ref 30.0–36.0)
MCV: 85.2 fL (ref 78.0–100.0)
Platelets: 326 10*3/uL (ref 150–400)
RBC: 4.46 MIL/uL (ref 4.22–5.81)
RDW: 14.4 % (ref 11.5–15.5)
WBC: 4 10*3/uL (ref 4.0–10.5)

## 2017-05-04 LAB — COMPREHENSIVE METABOLIC PANEL
ALT: 25 U/L (ref 17–63)
AST: 25 U/L (ref 15–41)
Albumin: 4 g/dL (ref 3.5–5.0)
Alkaline Phosphatase: 36 U/L — ABNORMAL LOW (ref 38–126)
Anion gap: 10 (ref 5–15)
BUN: 19 mg/dL (ref 6–20)
CO2: 24 mmol/L (ref 22–32)
Calcium: 9.3 mg/dL (ref 8.9–10.3)
Chloride: 105 mmol/L (ref 101–111)
Creatinine, Ser: 1 mg/dL (ref 0.61–1.24)
GFR calc Af Amer: >60 mL/min (ref >60)
GFR calc non Af Amer: >60 mL/min (ref >60)
Glucose, Bld: 100 mg/dL — ABNORMAL HIGH (ref 65–99)
Potassium: 4.1 mmol/L (ref 3.5–5.1)
Sodium: 139 mmol/L (ref 135–145)
Total Bilirubin: 0.6 mg/dL (ref 0.3–1.2)
Total Protein: 6.6 g/dL (ref 6.5–8.1)

## 2017-05-04 LAB — TYPE AND SCREEN
ABO/RH(D): AB POS
Antibody Screen: NEGATIVE

## 2017-05-04 LAB — PROTIME-INR
INR: 0.93
Prothrombin Time: 12.4 seconds (ref 11.4–15.2)

## 2017-05-04 LAB — APTT: aPTT: 28 seconds (ref 24–36)

## 2017-05-04 IMAGING — CR DG CHEST 2V
2 series · 2 of 2 positions shown · non-contrast
Comparison: PET-CT [DATE] and earlier. Chest radiographs in
[DATE].

CLINICAL DATA: 66-year-old male preoperative study. Mantle cell
lymphoma with progressive disease on PET-CT last month.

EXAM:
CHEST - 2 VIEW

[w chest pa]
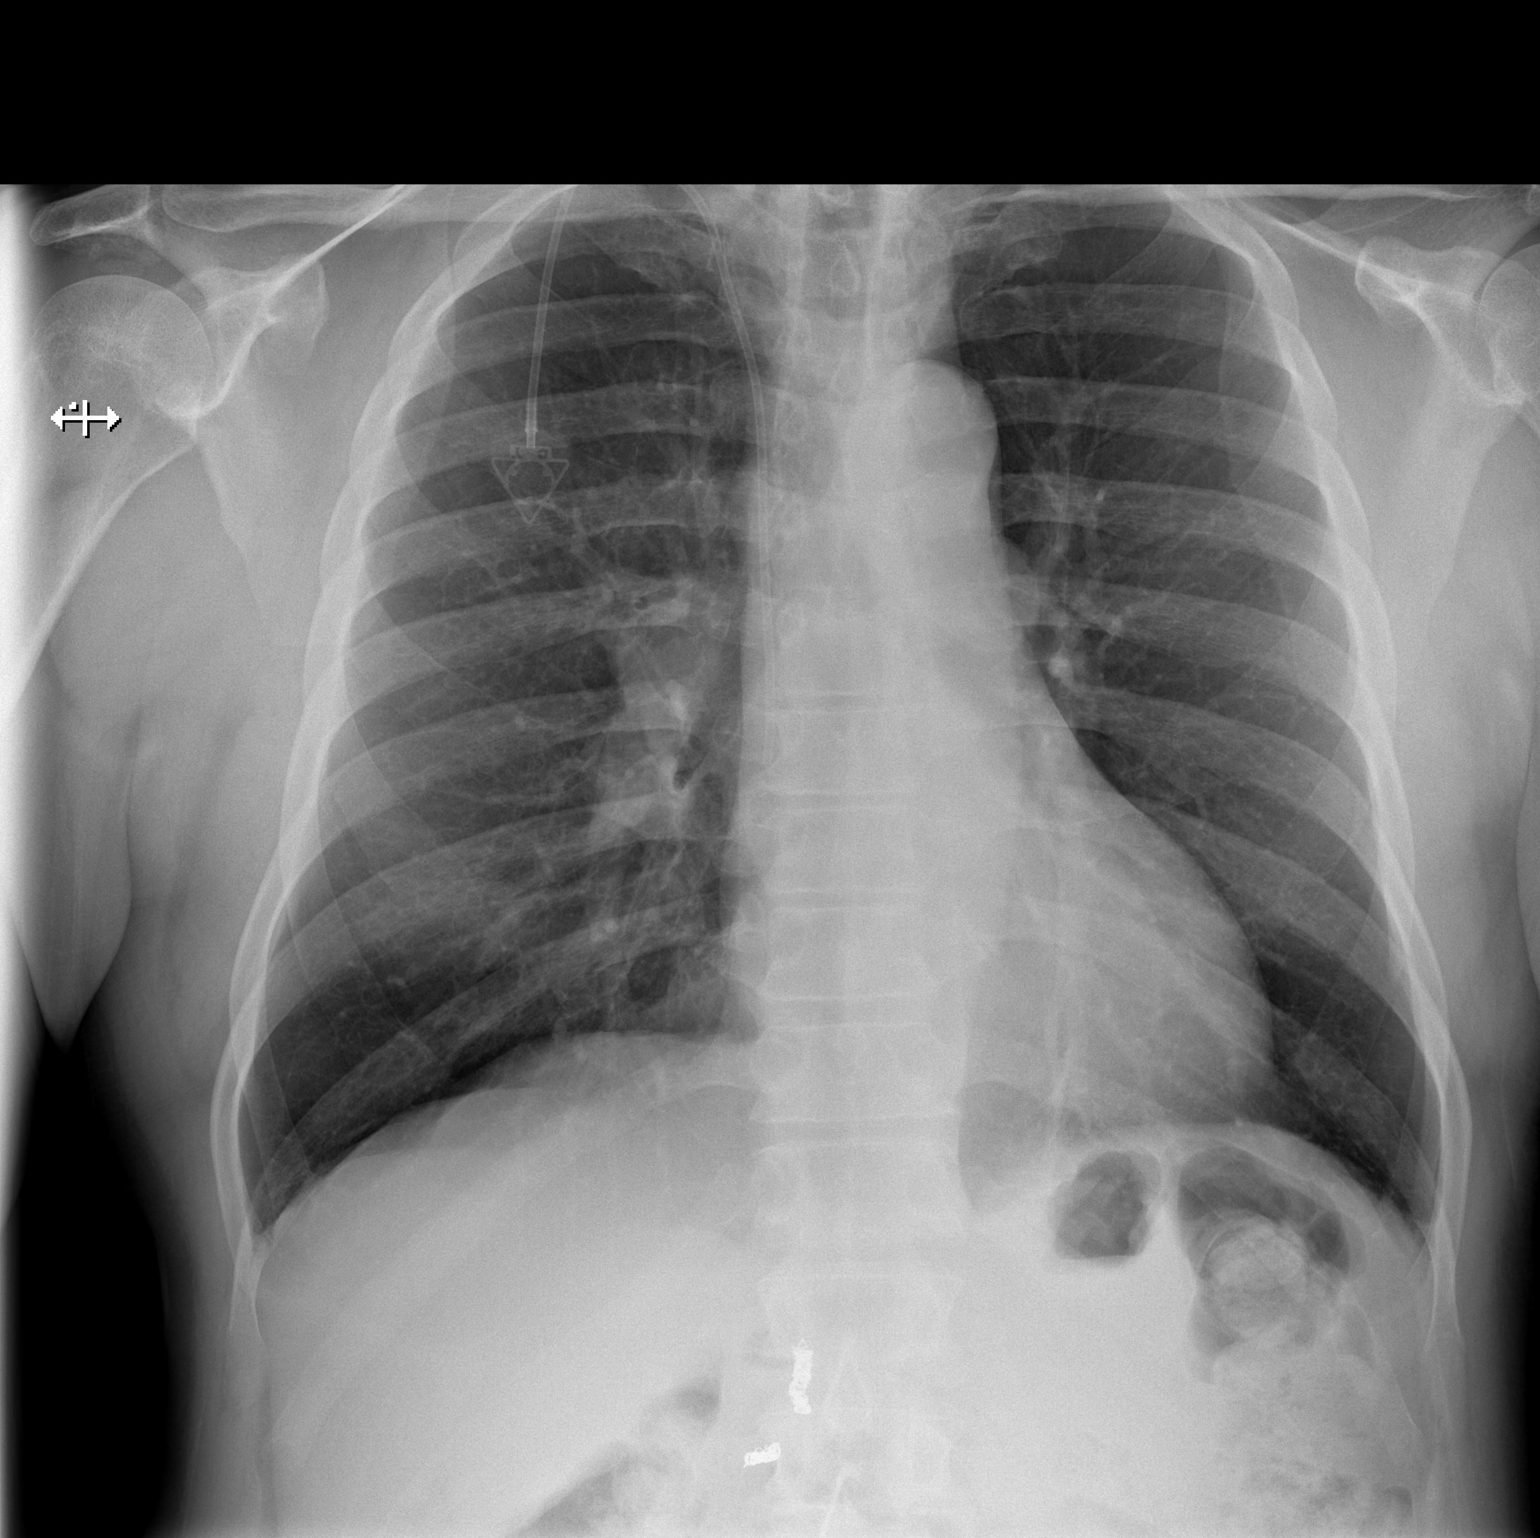

[w chest lat]
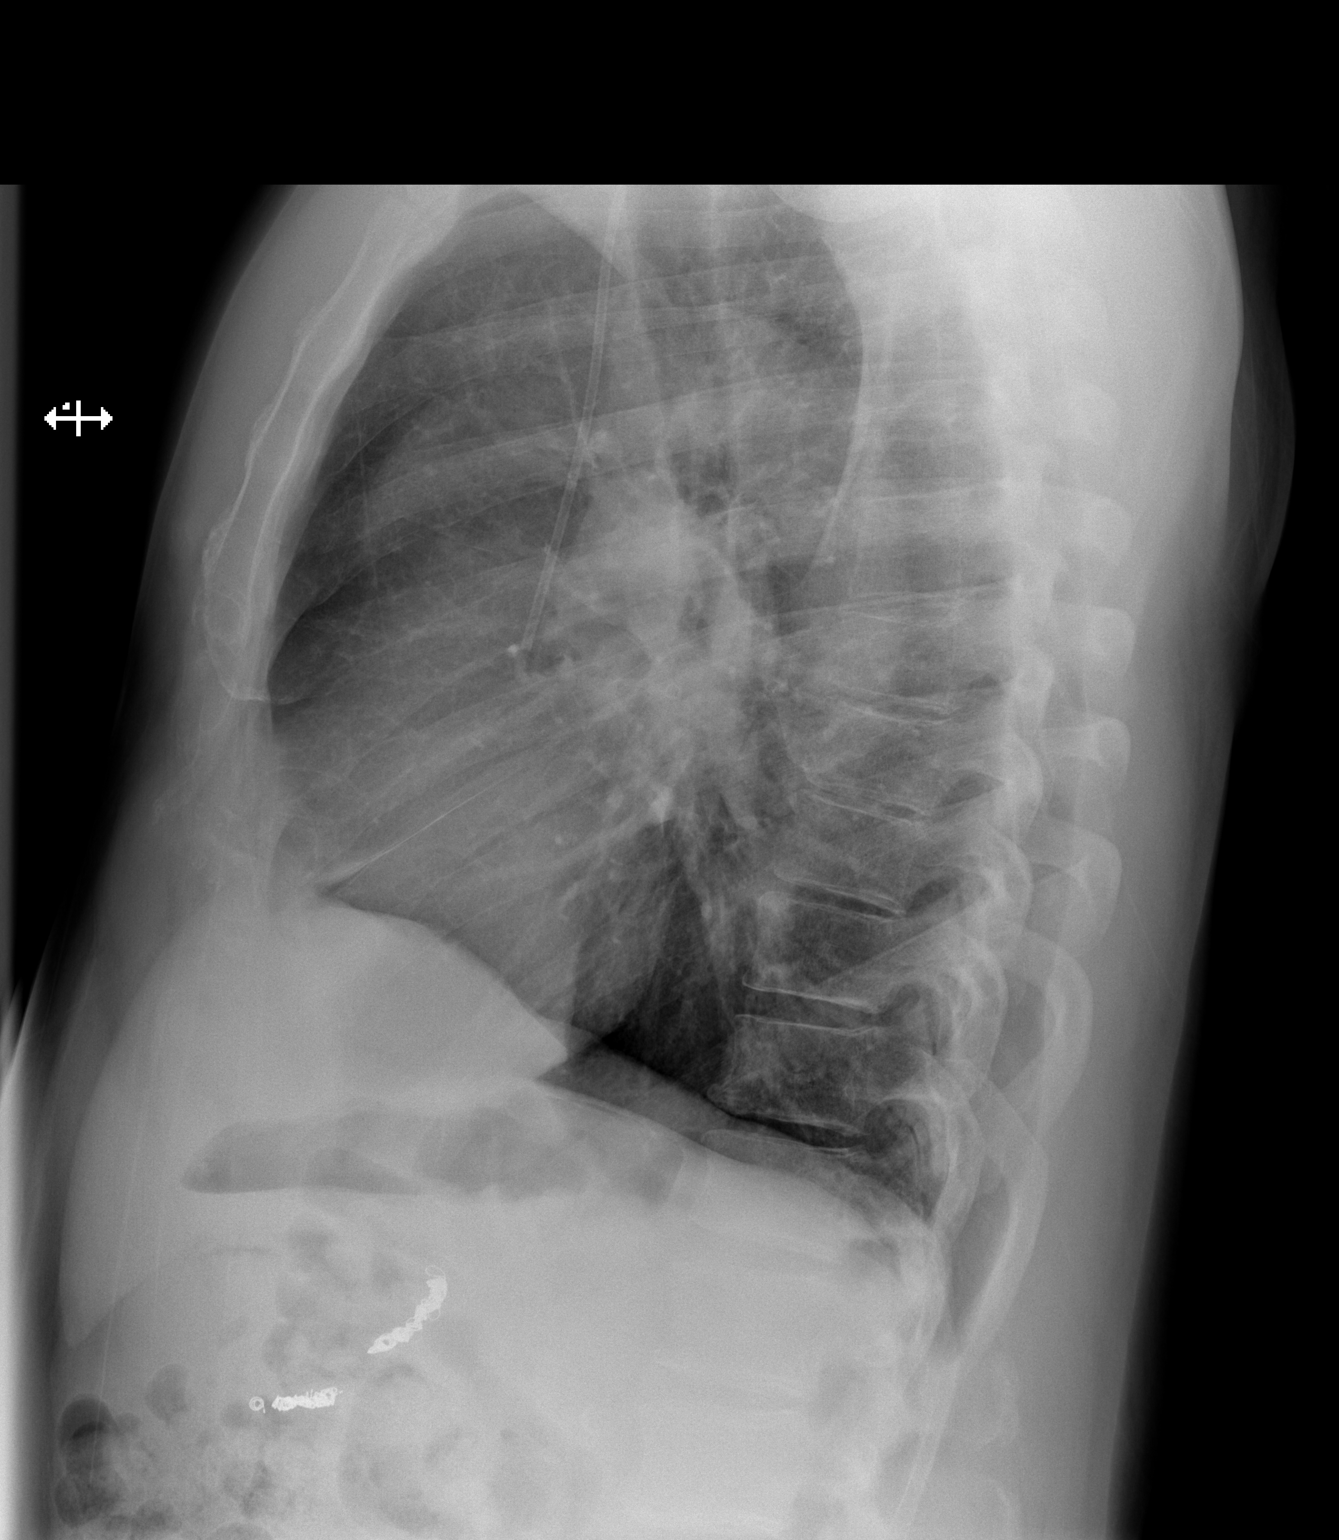

[2 of 2 positions shown; findings below may reference images not displayed]

FINDINGS: Right chest porta cath with no adverse features. Lung volumes and
mediastinal contours are within normal limits; there is only mild
hilar enlargement evident on the lateral view to correspond to the
mediastinal and hilar lymphadenopathy seen by PET. Both lungs are
clear. No pneumothorax or pleural effusion. No acute osseous
abnormality identified. Upper abdominal embolization coils
redemonstrated. Negative visible bowel gas pattern.
IMPRESSION: No acute cardiopulmonary abnormality. Mediastinal and hilar
lymphadenopathy better demonstrated by PET-CT.

## 2017-05-04 NOTE — Pre-Procedure Instructions (Signed)
[name]  05/04/2017     Your procedure is scheduled on April 2  Report to [facility] at [place].M.  Call this number if you have problems the morning of surgery:  [phone]   Remember:  Do not eat food or drink liquids after midnight.  Take these medicines the morning of surgery with A SIP OF WATER None   STOP/ Do not take Aspirin, Aleve, Naproxen, Advil, Ibuprofen, Motrin, Vitamins, Herbs, or Supplements starting today   Do not wear jewelry, make-up or nail polish.  Do not wear lotions, powders, or perfumes, or deodorant.  Do not shave 48 hours prior to surgery.  Men may shave face and neck.  Do not bring valuables to the hospital.  [facility] is not responsible for any belongings or valuables.  Contacts, dentures or bridgework may not be worn into surgery.  Leave your suitcase in the car.  After surgery it may be brought to your room.  For patients admitted to the hospital, discharge time will be determined by your treatment team.  Patients discharged the day of surgery will not be allowed to drive home.    - Preparing for Surgery  Before surgery, you can play an important role.  Because skin is not sterile, your skin needs to be as free of germs as possible.  You can reduce the number of germs on you skin by washing with CHG (chlorahexidine gluconate) soap before surgery.  CHG is an antiseptic cleaner which kills germs and bonds with the skin to continue killing germs even after washing.  Please DO NOT use if you have an allergy to CHG or antibacterial soaps.  If your skin becomes reddened/irritated stop using the CHG and inform your nurse when you arrive at Short Stay.  Do not shave (including legs and underarms) for at least 48 hours prior to the first CHG shower.  You may shave your face.  Please follow these instructions carefully:   1.  Shower with CHG Soap the night before surgery and the morning of Surgery.  2.  If you  choose to wash your hair, wash your hair first as usual with your normal shampoo.  3.  After you shampoo, rinse your hair and body thoroughly to remove the shampoo.  4.  Use CHG as you would any other liquid soap.  You can apply CHG directly to the skin and wash gently with scrungie or a clean washcloth.  5.  Apply the CHG Soap to your body ONLY FROM THE NECK DOWN.  Do not use on open wounds or open sores.  Avoid contact with your eyes, ears, mouth and genitals (private parts).  Wash genitals (private parts) with your normal soap.  6.  Wash thoroughly, paying special attention to the area where your surgery will be performed.  7.  Thoroughly rinse your body with warm water from the neck down.  8.  DO NOT shower/wash with your normal soap after using and rinsing off the CHG Soap.  9.  Pat yourself dry with a clean towel.            10.  Wear clean pajamas.            11.  Place clean sheets on your bed the night of your first shower and do not sleep with pets.  Day of Surgery  Do not apply any lotions the morning of surgery.  Please wear clean clothes to the hospital/surgery  center.   Please read over the following fact sheets that you were given. Pain Booklet and Coughing and Deep Breathing

## 2017-05-04 NOTE — Progress Notes (Signed)
Anesthesia PAT Evaluation: Patient is a 67 year old male scheduled for video bronchoscopy with endobronchial ultrasound on 05/05/17 by Dr. Lanelle Bal.   History includes never smoker, HTN, HLD, GERD, anemia, mantle cell non-Hodgkin's lymphoma ([facility]/Rituxan 02/09/15-07/05/15; rituximab maintenance started 08/16/15, repeat every 2 months; ibrutinib started 02/09/17. Both on hold now pending bronchoscopy results 05/2017), GI bleed and syncope (01/2015 at presentation of lymphoma diagnosis), weakness, right IJ Port-a-cath 01/26/15.   - PCP is Dr. Shelda Pal. Established care 01/05/17.  - HEM-ONC is Dr. Lurline Del. Last visit 04/23/17.  Meds include Valium (before scan), lisinopril-HCTZ, lovastatin, ibrutinib (not taking as of 05/04/17).  BP (!) 140/91   Pulse 100   Temp (!) 36.4 C   Resp 18   Ht 5\' 10"  (1.778 m)   Wt 206 lb (93.4 kg)   SpO2 98%   BMI 29.56 kg/m  Patient in NAD. No conversational dyspnea. Heart rhythm sounds predominantly regular, but with bursts of irregularities lasting only seconds. No murmur noted. Lungs clear. No carotid bruits noted. No ankle edema. He denied chest pain, SOB, edema, syncope, palpitations. He has no known CAD history. Father died at age 38 after his "heart gave out". His mother is alive at 25. He reports he was running and playing basketball until ~ 1 1/2 years ago when his knees began to hurt. He still stays active with his job at [employer] as it requires him to carry and drag heavy items. He also works out at his home regularly, but mainly weights. Ever since starting antihypertensive medication, he will occasionally have mild dizziness (for the past 4-5 years), but nothing new or worsening.   EKG 05/04/17 (0944; 0945): Initial tracing non-confirmed report read as: Atrial fibrillation at 77 bpm, ST and T wave abnormality, consider inferior ischemia, ST and T wave abnormality, consider anterolateral ischemia. Later tracing non-confirmed report read  as: Sinus rhythm with marked sinus arrhythmia, nonspecific T wave abnormality. In my personal review of his EKGs, there was underlying SR with PACs, but also runs of possible afib. I called and discussed patient's procedure, history, and EKGs with on-call cardiologist Dr. Lyman Bishop. He felt patient did have underlying SR with bypass track sinus arrhythmia or short RP tachycardia. In an asymptomatic patient, he did not have any additional preoperative recommendations. He also said that patient did not necessarily need to see cardiology in the future unless he began to develop symptoms and then b-blocker could be considered. Of note, patient did admit to drinking one cup of coffee and 2 V8 green tea drinks twice a day, so I did discuss that caffeine could be effecting the excitabiltiy of his heart, so consider limiting caffeine intake.   Echo 01/25/15: Study Conclusions - Left ventricle: The cavity size was mildly dilated. Systolic   function was normal. The estimated ejection fraction was in the   range of 55% to 60%. Wall motion was normal; there were no   regional wall motion abnormalities. Doppler parameters are   consistent with abnormal left ventricular relaxation (grade 1   diastolic dysfunction). - Aortic valve: There was mild regurgitation. - Aortic root: The aortic root was mildly dilated. - Ascending aorta: The ascending aorta was mildly dilated. - Mitral valve: There was mild regurgitation. - Left atrium: The atrium was mildly dilated. - Pulmonary arteries: PA peak pressure: 33 mm Hg (S). Impressions: - Normal LV systolic function; grade 1 diastolic dysfunction; mild   LAE; mildly dilated aortic root and ascending aorta; mild AI  and   MR; trace TR.  PET Scan 04/20/17: IMPRESSION: 1. Progressive hypermetabolic mediastinal and hilar lymphadenopathy as detailed above. 2. No findings for recurrent disease in the abdomen.  CXR 05/04/17: IMPRESSION: No acute cardiopulmonary  abnormality. Mediastinal and hilar lymphadenopathy better demonstrated by PET-CT.  Preoperative labs noted.   As above, PAT EKGs reviewed with cardiology. Patient has underlying SR. If he remains asymptomatic then I anticipate that he can proceed as planned. I have send Dr. Servando Snare and TCTS RN Thurmond Butts a staff message regarding EKG and my conversation with cardiology and will also forward my note to his PCP.  [name] [facility]/Anesthesiology Phone [phone] 05/04/2017 2:48 PM

## 2017-05-04 NOTE — Progress Notes (Addendum)
EKG noted to have rate controlled Atrial Fibrillation without history of same. Patient denies chest pain, shortness of breath, palpitations, or dizziness. Skin warm and dry. Spoke to [name], [place]- C. Requested that labs are run stat. Initial VS noted, repeat BP placed in chart.

## 2017-05-05 ENCOUNTER — Ambulatory Visit (HOSPITAL_COMMUNITY): Payer: BLUE CROSS/BLUE SHIELD | Admitting: Certified Registered"

## 2017-05-05 ENCOUNTER — Encounter (HOSPITAL_COMMUNITY)
Admission: RE | Disposition: A | Discharge status: Home / Self Care | Payer: Self-pay | Source: Ambulatory Visit | Attending: Cardiothoracic Surgery

## 2017-05-05 ENCOUNTER — Ambulatory Visit (HOSPITAL_COMMUNITY)
Admission: RE | Admit: 2017-05-05 | Discharge: 2017-05-05 | Disposition: A | Discharge status: Home / Self Care | Payer: BLUE CROSS/BLUE SHIELD | Source: Ambulatory Visit | Attending: Cardiothoracic Surgery | Admitting: Cardiothoracic Surgery

## 2017-05-05 ENCOUNTER — Encounter (HOSPITAL_COMMUNITY): Payer: Self-pay | Admitting: Surgery

## 2017-05-05 ENCOUNTER — Other Ambulatory Visit: Payer: Self-pay

## 2017-05-05 DIAGNOSIS — E785 Hyperlipidemia, unspecified: Secondary | ICD-10-CM | POA: Insufficient documentation

## 2017-05-05 DIAGNOSIS — I10 Essential (primary) hypertension: Secondary | ICD-10-CM | POA: Diagnosis not present

## 2017-05-05 DIAGNOSIS — R599 Enlarged lymph nodes, unspecified: Secondary | ICD-10-CM

## 2017-05-05 DIAGNOSIS — D649 Anemia, unspecified: Secondary | ICD-10-CM | POA: Diagnosis not present

## 2017-05-05 DIAGNOSIS — R591 Generalized enlarged lymph nodes: Secondary | ICD-10-CM

## 2017-05-05 DIAGNOSIS — C8312 Mantle cell lymphoma, intrathoracic lymph nodes: Secondary | ICD-10-CM | POA: Diagnosis not present

## 2017-05-05 DIAGNOSIS — Z79899 Other long term (current) drug therapy: Secondary | ICD-10-CM | POA: Insufficient documentation

## 2017-05-05 DIAGNOSIS — K219 Gastro-esophageal reflux disease without esophagitis: Secondary | ICD-10-CM | POA: Insufficient documentation

## 2017-05-05 DIAGNOSIS — R590 Localized enlarged lymph nodes: Secondary | ICD-10-CM | POA: Insufficient documentation

## 2017-05-05 HISTORY — PX: MEDIASTINOSCOPY: SHX5086

## 2017-05-05 HISTORY — PX: VIDEO BRONCHOSCOPY WITH ENDOBRONCHIAL ULTRASOUND: SHX6177

## 2017-05-05 SURGERY — BRONCHOSCOPY, WITH EBUS
Anesthesia: General

## 2017-05-05 MED ORDER — LACTATED RINGERS IV SOLN
INTRAVENOUS | Status: DC | PRN
  Administered 2017-05-05: 07:00:00 via INTRAVENOUS

## 2017-05-05 MED ORDER — ONDANSETRON HCL 4 MG/2ML IJ SOLN: 4.0000 mg | Freq: Once | INTRAMUSCULAR | Status: DC | PRN

## 2017-05-05 MED ORDER — SUGAMMADEX SODIUM 200 MG/2ML IV SOLN
INTRAVENOUS | Status: DC | PRN
  Administered 2017-05-05: 400 mg via INTRAVENOUS

## 2017-05-05 MED ORDER — ESMOLOL HCL 100 MG/10ML IV SOLN
INTRAVENOUS | Status: DC | PRN
  Administered 2017-05-05: 10 mg via INTRAVENOUS

## 2017-05-05 MED ORDER — PHENYLEPHRINE HCL 10 MG/ML IJ SOLN
INTRAVENOUS | Status: DC | PRN
  Administered 2017-05-05 (×2): 25 ug/min via INTRAVENOUS

## 2017-05-05 MED ORDER — OXYCODONE HCL 5 MG PO TABS: 5.0000 mg | ORAL_TABLET | Freq: Once | ORAL | Status: DC | PRN

## 2017-05-05 MED ORDER — ROCURONIUM BROMIDE 100 MG/10ML IV SOLN
INTRAVENOUS | Status: DC | PRN
  Administered 2017-05-05: 50 mg via INTRAVENOUS
  Administered 2017-05-05 (×3): 10 mg via INTRAVENOUS
  Administered 2017-05-05: 20 mg via INTRAVENOUS

## 2017-05-05 MED ORDER — MIDAZOLAM HCL 2 MG/2ML IJ SOLN
INTRAMUSCULAR | Status: AC
  Filled 2017-05-05: qty 2

## 2017-05-05 MED ORDER — ONDANSETRON HCL 4 MG/2ML IJ SOLN
INTRAMUSCULAR | Status: DC | PRN
  Administered 2017-05-05: 4 mg via INTRAVENOUS

## 2017-05-05 MED ORDER — LIDOCAINE HCL (PF) 1 % IJ SOLN
INTRAMUSCULAR | Status: AC
  Filled 2017-05-05: qty 30

## 2017-05-05 MED ORDER — EPINEPHRINE PF 1 MG/ML IJ SOLN
INTRAMUSCULAR | Status: AC
  Filled 2017-05-05: qty 1

## 2017-05-05 MED ORDER — PROPOFOL 10 MG/ML IV BOLUS
INTRAVENOUS | Status: AC
  Filled 2017-05-05: qty 20

## 2017-05-05 MED ORDER — 0.9 % SODIUM CHLORIDE (POUR BTL) OPTIME
TOPICAL | Status: DC | PRN
  Administered 2017-05-05: 1000 mL

## 2017-05-05 MED ORDER — PROPOFOL 10 MG/ML IV BOLUS
INTRAVENOUS | Status: DC | PRN
  Administered 2017-05-05: 160 mg via INTRAVENOUS

## 2017-05-05 MED ORDER — FENTANYL CITRATE (PF) 100 MCG/2ML IJ SOLN
INTRAMUSCULAR | Status: DC | PRN
  Administered 2017-05-05 (×2): 50 ug via INTRAVENOUS
  Administered 2017-05-05: 100 ug via INTRAVENOUS

## 2017-05-05 MED ORDER — FENTANYL CITRATE (PF) 250 MCG/5ML IJ SOLN
INTRAMUSCULAR | Status: AC
  Filled 2017-05-05: qty 5

## 2017-05-05 MED ORDER — OXYCODONE HCL 5 MG/5ML PO SOLN: 5.0000 mg | Freq: Once | ORAL | Status: DC | PRN

## 2017-05-05 MED ORDER — FENTANYL CITRATE (PF) 100 MCG/2ML IJ SOLN: 25.0000 ug | INTRAMUSCULAR | Status: DC | PRN

## 2017-05-05 MED ORDER — TRAMADOL HCL 50 MG PO TABS: 100.0000 mg | ORAL_TABLET | Freq: Four times a day (QID) | ORAL | 0 refills | Status: DC | PRN

## 2017-05-05 MED ORDER — HEMOSTATIC AGENTS (NO CHARGE) OPTIME
TOPICAL | Status: DC | PRN
  Administered 2017-05-05: 1 via TOPICAL

## 2017-05-05 MED ORDER — MIDAZOLAM HCL 5 MG/5ML IJ SOLN
INTRAMUSCULAR | Status: DC | PRN
  Administered 2017-05-05: 2 mg via INTRAVENOUS

## 2017-05-05 MED ORDER — LIDOCAINE HCL (CARDIAC) 20 MG/ML IV SOLN
INTRAVENOUS | Status: DC | PRN
  Administered 2017-05-05: 40 mg via INTRAVENOUS

## 2017-05-05 MED ORDER — CEFAZOLIN SODIUM-DEXTROSE 2-4 GM/100ML-% IV SOLN
2.0000 g | INTRAVENOUS | Status: AC
  Administered 2017-05-05: 2 g via INTRAVENOUS

## 2017-05-05 SURGICAL SUPPLY — 51 items
BLADE SURG 10 STRL SS (BLADE) ×2 IMPLANT
BRUSH CYTOL CELLEBRITY 1.5X140 (MISCELLANEOUS) IMPLANT
CANISTER SUCT 3000ML PPV (MISCELLANEOUS) ×4 IMPLANT
CLIP VESOCCLUDE MED 6/CT (CLIP) IMPLANT
CONT SPEC 4OZ CLIKSEAL STRL BL (MISCELLANEOUS) ×16 IMPLANT
COVER BACK TABLE 60X90IN (DRAPES) ×2 IMPLANT
COVER DOME SNAP 22 D (MISCELLANEOUS) ×2 IMPLANT
COVER SURGICAL LIGHT HANDLE (MISCELLANEOUS) ×4 IMPLANT
DERMABOND ADVANCED (GAUZE/BANDAGES/DRESSINGS) ×1
DERMABOND ADVANCED .7 DNX12 (GAUZE/BANDAGES/DRESSINGS) ×1 IMPLANT
DRAPE LAPAROTOMY T 102X78X121 (DRAPES) ×2 IMPLANT
DRSG AQUACEL AG ADV 3.5X14 (GAUZE/BANDAGES/DRESSINGS) ×2 IMPLANT
ELECT CAUTERY BLADE 6.4 (BLADE) ×2 IMPLANT
ELECT REM PT RETURN 9FT ADLT (ELECTROSURGICAL) ×2
ELECTRODE REM PT RTRN 9FT ADLT (ELECTROSURGICAL) ×1 IMPLANT
FORCEPS BIOP RJ4 1.8 (CUTTING FORCEPS) IMPLANT
GAUZE SPONGE 4X4 12PLY STRL (GAUZE/BANDAGES/DRESSINGS) ×4 IMPLANT
GAUZE SPONGE 4X4 16PLY XRAY LF (GAUZE/BANDAGES/DRESSINGS) ×4 IMPLANT
GLOVE BIO SURGEON STRL SZ 6.5 (GLOVE) ×4 IMPLANT
GOWN STRL REUS W/ TWL LRG LVL3 (GOWN DISPOSABLE) ×1 IMPLANT
GOWN STRL REUS W/TWL LRG LVL3 (GOWN DISPOSABLE) ×1
HEMOSTAT SURGICEL 2X14 (HEMOSTASIS) ×2 IMPLANT
KIT BASIN OR (CUSTOM PROCEDURE TRAY) ×2 IMPLANT
KIT CLEAN ENDO COMPLIANCE (KITS) ×4 IMPLANT
KIT TURNOVER KIT B (KITS) ×4 IMPLANT
MARKER SKIN DUAL TIP RULER LAB (MISCELLANEOUS) ×2 IMPLANT
NEEDLE EBUS SONO TIP PENTAX (NEEDLE) ×2 IMPLANT
NEEDLE FILTER BLUNT 18X 1/2SAF (NEEDLE) ×1
NEEDLE FILTER BLUNT 18X1 1/2 (NEEDLE) ×1 IMPLANT
NS IRRIG 1000ML POUR BTL (IV SOLUTION) ×4 IMPLANT
OIL SILICONE PENTAX (PARTS (SERVICE/REPAIRS)) ×2 IMPLANT
PACK SURGICAL SETUP 50X90 (CUSTOM PROCEDURE TRAY) ×2 IMPLANT
PAD ARMBOARD 7.5X6 YLW CONV (MISCELLANEOUS) ×10 IMPLANT
PENCIL BUTTON HOLSTER BLD 10FT (ELECTRODE) ×2 IMPLANT
SOLUTION ANTI FOG 6CC (MISCELLANEOUS) ×2 IMPLANT
SPONGE INTESTINAL PEANUT (DISPOSABLE) ×2 IMPLANT
STAPLER VISISTAT 35W (STAPLE) IMPLANT
SUT VIC AB 3-0 SH 18 (SUTURE) ×2 IMPLANT
SUT VICRYL 4-0 PS2 18IN ABS (SUTURE) ×2 IMPLANT
SWAB COLLECTION DEVICE MRSA (MISCELLANEOUS) IMPLANT
SWAB CULTURE ESWAB REG 1ML (MISCELLANEOUS) IMPLANT
SYR 10ML LL (SYRINGE) ×4 IMPLANT
SYR 20CC LL (SYRINGE) ×2 IMPLANT
SYR 20ML ECCENTRIC (SYRINGE) ×2 IMPLANT
TOWEL GREEN STERILE (TOWEL DISPOSABLE) ×4 IMPLANT
TOWEL GREEN STERILE FF (TOWEL DISPOSABLE) ×4 IMPLANT
TRAP SPECIMEN MUCOUS 40CC (MISCELLANEOUS) ×2 IMPLANT
TUBE CONNECTING 12X1/4 (SUCTIONS) ×4 IMPLANT
TUBE CONNECTING 20X1/4 (TUBING) ×2 IMPLANT
VALVE DISPOSABLE (MISCELLANEOUS) ×2 IMPLANT
WATER STERILE IRR 1000ML POUR (IV SOLUTION) ×4 IMPLANT

## 2017-05-05 NOTE — Anesthesia Procedure Notes (Signed)
Procedure Name: Intubation Date/Time: 05/05/2017 7:32 AM Performed by: Josie Dixon, CRNA Pre-anesthesia Checklist: Patient identified, Emergency Drugs available, Suction available, Patient being monitored and Timeout performed Patient Re-evaluated:Patient Re-evaluated prior to induction Oxygen Delivery Method: Circle system utilized Preoxygenation: Pre-oxygenation with 100% oxygen Induction Type: IV induction Ventilation: Mask ventilation without difficulty Laryngoscope Size: Mac, 4 and Glidescope Grade View: Grade I Tube type: Oral Tube size: 8.5 mm Number of attempts: 1 Airway Equipment and Method: Stylet Placement Confirmation: ETT inserted through vocal cords under direct vision,  positive ETCO2 and breath sounds checked- equal and bilateral Secured at: 21 cm Tube secured with: Tape Dental Injury: Teeth and Oropharynx as per pre-operative assessment

## 2017-05-05 NOTE — Discharge Instructions (Signed)
Wound Care, Adult Taking care of your wound properly can help to prevent pain and infection. It can also help your wound to heal more quickly. How is this treated? Wound care  Follow instructions from your health care provider about how to take care of your wound. Make sure you: ? Wash your hands with soap and water before you change the bandage (dressing). If soap and water are not available, use hand sanitizer. ? Change your dressing as told by your health care provider. ? Leave stitches (sutures), skin glue, or adhesive strips in place. These skin closures may need to stay in place for 2 weeks or longer. If adhesive strip edges start to loosen and curl up, you may trim the loose edges. Do not remove adhesive strips completely unless your health care provider tells you to do that.  Check your wound area every day for signs of infection. Check for: ? More redness, swelling, or pain. ? More fluid or blood. ? Warmth. ? Pus or a bad smell.  Ask your health care provider if you should clean the wound with mild soap and water. Doing this may include: ? Using a clean towel to pat the wound dry after cleaning it. Do not rub or scrub the wound. ? Applying a cream or ointment. Do this only as told by your health care provider. ? Covering the incision with a clean dressing.  Ask your health care provider when you can leave the wound uncovered. Medicines   If you were prescribed an antibiotic medicine, cream, or ointment, take or use the antibiotic as told by your health care provider. Do not stop taking or using the antibiotic even if your condition improves.  Take over-the-counter and prescription medicines only as told by your health care provider. If you were prescribed pain medicine, take it at least 30 minutes before doing any wound care or as told by your health care provider. General instructions  Return to your normal activities as told by your health care provider. Ask your health care  provider what activities are safe.  Do not scratch or pick at the wound.  Keep all follow-up visits as told by your health care provider. This is important.  Eat a diet that includes protein, vitamin A, vitamin C, and other nutrient-rich foods. These help the wound heal: ? Protein-rich foods include meat, dairy, beans, nuts, and other sources. ? Vitamin A-rich foods include carrots and dark green, leafy vegetables. ? Vitamin C-rich foods include citrus, tomatoes, and other fruits and vegetables. ? Nutrient-rich foods have protein, carbohydrates, fat, vitamins, or minerals. Eat a variety of healthy foods including vegetables, fruits, and whole grains. Contact a health care provider if:  You received a tetanus shot and you have swelling, severe pain, redness, or bleeding at the injection site.  Your pain is not controlled with medicine.  You have more redness, swelling, or pain around the wound.  You have more fluid or blood coming from the wound.  Your wound feels warm to the touch.  You have pus or a bad smell coming from the wound.  You have a fever or chills.  You are nauseous or you vomit.  You are dizzy. Get help right away if:  You have a red streak going away from your wound.  The edges of the wound open up and separate.  Your wound is bleeding and the bleeding does not stop with gentle pressure.  You have a rash.  You faint.  You have trouble  breathing. This information is not intended to replace advice given to you by your health care provider. Make sure you discuss any questions you have with your health care provider. Document Released: 10/30/2007 Document Revised: 09/19/2015 Document Reviewed: 08/07/2015 Elsevier Interactive Patient Education  2017 [employer].   Flexible Bronchoscopy, Care After This sheet gives you information about how to care for yourself after your procedure. Your health care provider may also give you more specific instructions. If  you have problems or questions, contact your health care provider. What can I expect after the procedure? After the procedure, it is common to have the following symptoms for 24-48 hours:  A cough that is worse than it was before the procedure.  A low-grade fever.  A sore throat or hoarse voice.  Small streaks of blood in the mucus from your lungs (sputum), if tissue samples were removed (biopsy).  Follow these instructions at home: Eating and drinking  Do not eat or drink anything (including water) for 2 hours after your procedure, or until your numbing medicine (local anesthetic) has worn off. Having a numb throat increases your risk of burning yourself or choking.  After your numbness is gone and your cough and gag reflexes have returned, you may start eating only soft foods and slowly drinking liquids.  The day after the procedure, return to your normal diet. Driving  Do not drive for 24 hours if you were given a medicine to help you relax (sedative).  Do not drive or use heavy machinery while taking prescription pain medicine. General instructions  Take over-the-counter and prescription medicines only as told by your health care provider.  Return to your normal activities as told by your health care provider. Ask your health care provider what activities are safe for you.  Do not use any products that contain nicotine or tobacco, such as cigarettes and e-cigarettes. If you need help quitting, ask your health care provider.  Keep all follow-up visits as told by your health care provider. This is important, especially if you had a biopsy taken. Get help right away if:  You have shortness of breath that gets worse.  You become light-headed or feel like you might faint.  You have chest pain.  You cough up more than a small amount of blood.  The amount of blood you cough up increases. Summary  Common symptoms in the 24-48 hours following a flexible bronchoscopy include  cough, low-grade fever, sore throat or hoarse voice, and blood-streaked mucus from the lungs (if you had a biopsy).  Do not eat or drink anything (including water) for 2 hours after your procedure, or until your local anesthetic has worn off. You can return to your normal diet the day after the procedure.  Get help right away if you develop worsening shortness of breath, have chest pain, become light-headed, or cough up more than a small amount of blood. This information is not intended to replace advice given to you by your health care provider. Make sure you discuss any questions you have with your health care provider. Document Released: 08/09/2004 Document Revised: 02/08/2016 Document Reviewed: 02/08/2016 Elsevier Interactive Patient Education  2017 [employer] [facility] Home Care Instructions  Activity: Get plenty of rest for the remainder of the day. A responsible individual must stay with you for 24 hours following the procedure.  For the next 24 hours, DO NOT: -Drive a car -Paediatric nurse -Drink alcoholic beverages -Take any medication unless instructed by your physician -  Make any legal decisions or sign important papers.  Meals: Start with liquid foods such as gelatin or soup. Progress to regular foods as tolerated. Avoid greasy, spicy, heavy foods. If nausea and/or vomiting occur, drink only clear liquids until the nausea and/or vomiting subsides. Call your physician if vomiting continues.  Special Instructions/Symptoms: Your throat may feel dry or sore from the anesthesia or the breathing tube placed in your throat during surgery. If this causes discomfort, gargle with warm salt water. The discomfort should disappear within 24 hours.  If you had a scopolamine patch placed behind your ear for the management of post- operative nausea and/or vomiting:  1. The medication in the patch is effective for 72 hours, after which it should be removed.  Wrap patch in a tissue  and discard in the trash. Wash hands thoroughly with soap and water. 2. You may remove the patch earlier than 72 hours if you experience unpleasant side effects which may include dry mouth, dizziness or visual disturbances. 3. Avoid touching the patch. Wash your hands with soap and water after contact with the patch.

## 2017-05-05 NOTE — Anesthesia Procedure Notes (Signed)
Arterial Line Insertion Start/End4/03/2017 6:55 AM Performed by: Josie Dixon, CRNA, CRNA  Patient location: Pre-op. Preanesthetic checklist: patient identified, IV checked, site marked, risks and benefits discussed, surgical consent, monitors and equipment checked and pre-op evaluation Lidocaine 1% used for infiltration Right, radial was placed Catheter size: 20 G Hand hygiene performed , maximum sterile barriers used  and Seldinger technique used Allen's test indicative of satisfactory collateral circulation Attempts: 1 Procedure performed without using ultrasound guided technique. Ultrasound Notes:anatomy identified, needle tip was noted to be adjacent to the nerve/plexus identified and no ultrasound evidence of intravascular and/or intraneural injection Following insertion, dressing applied and Biopatch. Post procedure assessment: normal  Patient tolerated the procedure well with no immediate complications.

## 2017-05-05 NOTE — Transfer of Care (Signed)
Immediate Anesthesia Transfer of Care Note  Patient: [name]  Procedure(s) Performed: VIDEO BRONCHOSCOPY WITH ENDOBRONCHIAL ULTRASOUND (N/A ) MEDIASTINOSCOPY (N/A )  Patient Location: PACU  Anesthesia Type:General  Level of Consciousness: alert , oriented, drowsy and patient cooperative  Airway & Oxygen Therapy: Patient Spontanous Breathing and Patient connected to face mask oxygen  Post-op Assessment: Report given to RN and Post -op Vital signs reviewed and stable  Post vital signs: Reviewed and stable  Last Vitals:  Vitals Value Taken Time  BP 116/74 05/05/2017 10:50 AM  Temp    Pulse 71 05/05/2017 10:57 AM  Resp 5 05/05/2017 10:57 AM  SpO2 100 % 05/05/2017 10:57 AM  Vitals shown include unvalidated device data.  Last Pain:  Vitals:   05/05/17 0615  TempSrc:   PainSc: 0-No pain      Patients Stated Pain Goal: 4 (81/27/51 7001)  Complications: No apparent anesthesia complications

## 2017-05-05 NOTE — Anesthesia Preprocedure Evaluation (Signed)

## 2017-05-05 NOTE — Brief Op Note (Signed)
      [address]       [place] [address]             [phone]      05/05/2017  10:57 AM  PATIENT:  [name]  67 y.o. male  PRE-OPERATIVE DIAGNOSIS:  ADENOPATHY  POST-OPERATIVE DIAGNOSIS:  ADENOPATHY  PROCEDURE:  Procedure(s): VIDEO BRONCHOSCOPY WITH ENDOBRONCHIAL ULTRASOUND (N/A) MEDIASTINOSCOPY (N/A)  SURGEON:  Surgeon(s) and Role:    * Grace Isaac, MD - Primary    ANESTHESIA:   general  EBL:  minimal   BLOOD ADMINISTERED:none  DRAINS: none   LOCAL MEDICATIONS USED:  NONE  SPECIMEN:  Source of Specimen:  4r and 7 nodes   DISPOSITION OF SPECIMEN:  PATHOLOGY  COUNTS:  YES   DICTATION: .Dragon Dictation  PLAN OF CARE: Discharge to home after PACU  PATIENT DISPOSITION:  PACU - hemodynamically stable.   Delay start of Pharmacological VTE agent (>24hrs) due to surgical blood loss or risk of bleeding: yes

## 2017-05-05 NOTE — H&P (Signed)
[address]       [place] [address]             [phone]                    [name] [facility] Medical Record #[id number] Date of Birth: 03/07/1950  Referring: Magrinat, Virgie Dad, MD Primary Care: Gary Pal, DO Primary Cardiologist: No primary care provider on file.  Chief Complaint:        Chief Complaint  Patient presents with  . Mediastinal Mass    New patient, discuss surgery, PET 04/20/2017      History of Present Illness:    [name] 67 y.o. male is seen in the office  today for consideration of mediastinal biopsy.  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  CURRENT TREATMENT: Maintenance rituximab; added ibrutinib 02/09/2017  Serial PET scans have showed progressive recurrent mediastinal involvement.        Current Activity/ Functional Status:  Patient is independent with mobility/ambulation, transfers, ADL's, IADL's.   Zubrod Score: At the time of surgery this patient's most appropriate activity status/level should be described as: [x]     0    Normal activity, no symptoms []     1    Restricted in physical strenuous activity but ambulatory, able to do out light work []     2    Ambulatory and capable of self care, unable to do work activities, up and about               >50 % of waking hours                              []     3    Only limited self care, in bed greater than 50% of waking hours []     4    Completely disabled, no self care, confined to bed or chair []     5    Moribund   Past Medical History:  Diagnosis Date  . Anemia   . Cancer ([place])    Lymphoma  . Essential hypertension   . GERD (gastroesophageal reflux disease)   . GI bleed   . HLD (hyperlipidemia)   . Weakness     Past Surgical History:  Procedure Laterality Date  . ESOPHAGOGASTRODUODENOSCOPY Left 01/18/2015   Procedure: ESOPHAGOGASTRODUODENOSCOPY (EGD);  Surgeon: Gary Corner, MD;  Location: [facility] ENDOSCOPY;  Service: Endoscopy;  Laterality: Left;  . INGUINAL HERNIA REPAIR Right 01/22/2015   Procedure: RIGHT INGUINAL LYMPH NODE BX;  Surgeon: Gary Mesa, MD;  Location: [facility];  Service: General;  Laterality: Right;  . PORTACATH PLACEMENT  01/26/2015    power port with tip SVC/RA [place]  . SKIN SURGERY     Small benign cysts over left scalp removed    Family History  Problem Relation Age of Onset  . Hypertension Mother   . Hypertension Father   . Kidney failure Father   . Hypertension Sister   . Diabetes Sister   . Prostate cancer Brother   . Lupus Sister     Social History   Socioeconomic History  . Marital status: Married    Spouse name: Not on file  Tobacco Use  . Smoking status: Never Smoker  . Smokeless tobacco: Never Used  Substance and Sexual Activity  . Alcohol use: Yes    Comment: occasional beer  . Drug use: No  . Sexual activity: Not on file    Social History   Tobacco Use  Smoking Status Never Smoker  Smokeless Tobacco Never Used    Social History   Substance and Sexual Activity  Alcohol Use Yes   Comment: occasional beer     No Known Allergies  Current Facility-Administered Medications  Medication Dose Route Frequency Provider Last Rate Last Dose  . ceFAZolin (ANCEF) IVPB 2g/100 mL premix  2 g Intravenous 30 min Pre-Op Grace Isaac, MD         Review of Systems:     Cardiac Review of Systems: [Y] = yes  or   [  ] = no   Chest Pain [  n  ]  Resting SOB [ n  ] Exertional SOB  [n  ]  Vertell Limber Florencio.Farrier  ]   Pedal Edema [ n  ]    Palpitations [  n] Syncope  [ n ]   Presyncope [n   ]  General Review of Systems: [Y] = yes [  ]=no Constitional: recent weight change [  ];  Wt loss over the last 3 months [   ] anorexia [  ]; fatigue Blue.Reese  ]; nausea [ n ]; night sweats [n  ]; fever [n  ]; or chills [ n ];          Dental: poor dentition[  ]; Last Dentist  visit:   Eye : blurred vision [  ]; diplopia [   ]; vision changes [  ];  Amaurosis fugax[  ]; Resp: cough [  ];  wheezing[  ];  hemoptysis[  ]; shortness of breath[  ]; paroxysmal nocturnal dyspnea[  ]; dyspnea on exertion[  ]; or orthopnea[  ];  GI:  gallstones[  ], vomiting[  ];  dysphagia[  ]; melena[  ];  hematochezia [  ]; heartburn[  ];   Hx of  Colonoscopy[  ]; GU: kidney stones [  ]; hematuria[  ];   dysuria [  ];  nocturia[  ];  history of     obstruction [  ]; urinary frequency [  ]             Skin: rash, swelling[  ];, hair loss[  ];  peripheral edema[  ];  or itching[  ]; Musculosketetal: myalgias[  ];  joint swelling[  ];  joint erythema[  ];  joint pain[  ];  back pain[  ];  Heme/Lymph: bruising[  ];  bleeding[  ];  anemia[  ];  Neuro: TIA[  ];  headaches[  ];  stroke[  ];  vertigo[  ];  seizures[  ];   paresthesias[  ];  difficulty walking[  ];  Psych:depression[  ]; anxiety[  ];  Endocrine: diabetes[ n ];  thyroid dysfunction[ n ];  Immunizations: Flu up to date [  ]; Pneumococcal up to date [  ];  Other:  Physical Exam: BP (!) 119/97   Pulse (!) 56   Temp 98.4 F (36.9 C) (Oral)   Resp 18   SpO2 100%   PHYSICAL EXAMINATION: General appearance: alert, cooperative, appears stated age and no distress Head: Normocephalic, without obvious abnormality, atraumatic Neck: no adenopathy, no carotid bruit, no JVD, supple, symmetrical, trachea midline and thyroid not enlarged, symmetric, no tenderness/mass/nodules Lymph nodes: Cervical, supraclavicular, and axillary nodes normal. Resp: clear to auscultation  bilaterally Back: symmetric, no curvature. ROM normal. No CVA tenderness. Cardio: regular rate and rhythm, S1, S2 normal, no murmur, click, rub or gallop GI: soft, non-tender; bowel sounds normal; no masses,  no organomegaly Extremities: extremities normal, atraumatic, no cyanosis or edema and Homans sign is negative, no sign of DVT Neurologic: Grossly normal  Diagnostic  Studies & Laboratory data:     Recent Radiology Findings:   Dg Chest 2 View  Result Date: 05/04/2017 CLINICAL DATA:  67 year old male preoperative study. Mantle cell lymphoma with progressive disease on PET-CT last month. EXAM: CHEST - 2 VIEW COMPARISON:  PET-CT 04/20/2017 and earlier. Chest radiographs in December 2016. FINDINGS: Right chest porta cath with no adverse features. Lung volumes and mediastinal contours are within normal limits; there is only mild hilar enlargement evident on the lateral view to correspond to the mediastinal and hilar lymphadenopathy seen by PET. Both lungs are clear. No pneumothorax or pleural effusion. No acute osseous abnormality identified. Upper abdominal embolization coils redemonstrated. Negative visible bowel gas pattern. IMPRESSION: No acute cardiopulmonary abnormality. Mediastinal and hilar lymphadenopathy better demonstrated by PET-CT. Electronically Signed   By: Genevie Ann M.D.   On: 05/04/2017 10:24   Nm Pet Image Restag (ps) Skull Base To Thigh  Result Date: 04/20/2017 CLINICAL DATA:  Subsequent treatment strategy for mantle cell lymphoma. EXAM: NUCLEAR MEDICINE PET SKULL BASE TO THIGH TECHNIQUE: 10.6 mCi F-18 FDG was injected intravenously. Full-ring PET imaging was performed from the skull base to thigh after the radiotracer. CT data was obtained and used for attenuation correction and anatomic localization. Fasting blood glucose: 98 mg/dl Mediastinal blood pool activity: SUV max 0.6 COMPARISON:  Multiple prior PET CTs.  The most recent is 02/02/2017 FINDINGS: NECK: No hypermetabolic lymph nodes in the neck. Significant motion artifact. Incidental CT findings: none CHEST: Progressive mediastinal and hilar lymphadenopathy. 19.5 mm pretracheal lymph node on image number 64 previously measured 9.5 mm. SUV max is 15.4 and was previously 7.8. Right hilar node is difficult to measure without contrast but has definitely enlarged and SUV max is 19.6 and was previously 9.8.  Subcarinal nodal mass measures 19.5 mm on image number 69 and previously measured 13.5 mm. SUV max is 18.9 and was previously 6.6. No worrisome pulmonary lesions. Incidental CT findings: none ABDOMEN/PELVIS: No abnormal hypermetabolic activity within the liver, pancreas, adrenal glands, or spleen. No hypermetabolic lymph nodes in the abdomen or pelvis. No evidence of recurrent disease in the abdomen/pelvis. Incidental CT findings: none SKELETON: Diffuse marrow activity likely due to rebound from chemotherapy or marrow stimulating drugs. Incidental CT findings: none IMPRESSION: 1. Progressive hypermetabolic mediastinal and hilar lymphadenopathy as detailed above. 2. No findings for recurrent disease in the abdomen. Electronically Signed   By: Marijo Sanes M.D.   On: 04/20/2017 12:12   I have independently reviewed the above radiology studies  and reviewed the findings with the patient.   Recent Lab Findings: Lab Results  Component Value Date   WBC 4.0 05/04/2017   HGB 12.1 (L) 05/04/2017   HCT 38.0 (L) 05/04/2017   PLT 326 05/04/2017   GLUCOSE 100 (H) 05/04/2017   CHOL 143 01/18/2015   TRIG 107 01/23/2015   HDL 20 (L) 01/18/2015   LDLCALC 93 01/18/2015   ALT 25 05/04/2017   AST 25 05/04/2017   NA 139 05/04/2017   K 4.1 05/04/2017   CL 105 05/04/2017   CREATININE 1.00 05/04/2017   BUN 19 05/04/2017   CO2 24 05/04/2017   INR 0.93 05/04/2017  Assessment / Plan:   Patient with previous history of non-Hodgkin's lymphoma originally diagnosed as a duodenal mass, now presents with progressive mediastinal adenopathy hypermetabolic on PET scan.  Patient is referred for consideration of mediastinal biopsy to determine the cause of the mediastinal adenopathy.  I discussed with the patient proceeding with bronchoscopy ebus, and likely mediastinoscopy to to obtain adequate to achieve tissue for evaluation for lymphoma.  Risks and options were discussed with patient and he is agreeable with  proceeding.    The goals risks and alternatives of the planned surgical procedure Procedure(s): VIDEO BRONCHOSCOPY WITH ENDOBRONCHIAL ULTRASOUND (N/A) MEDIASTINOSCOPY (N/A)  have been discussed with the patient in detail. The risks of the procedure including death, infection, stroke, myocardial infarction, bleeding, blood transfusion, not being able to obtain a diagnosis  have all been discussed specifically.  I have quoted Barnett Hatter a 1% of perioperative mortality and a complication rate as high as 10 %. The patient's questions have been answered.Barnett Hatter is willing  to proceed with the planned procedure.    Grace Isaac MD      [address] [place] [address] Office [phone]   Beeper [phone]  05/05/2017 7:12 AM

## 2017-05-05 NOTE — Anesthesia Postprocedure Evaluation (Signed)
Anesthesia Post Note  Patient: [name]  Procedure(s) Performed: VIDEO BRONCHOSCOPY WITH ENDOBRONCHIAL ULTRASOUND (N/A ) MEDIASTINOSCOPY (N/A )     Patient location during evaluation: PACU Anesthesia Type: General Level of consciousness: awake and alert Pain management: pain level controlled Vital Signs Assessment: post-procedure vital signs reviewed and stable Respiratory status: spontaneous breathing, nonlabored ventilation, respiratory function stable and patient connected to nasal cannula oxygen Cardiovascular status: blood pressure returned to baseline and stable Postop Assessment: no apparent nausea or vomiting Anesthetic complications: no    Last Vitals:  Vitals:   05/05/17 1120 05/05/17 1153  BP: 116/77 (!) 141/65  Pulse: 76 81  Resp: 16 16  Temp: 36.6 C 36.8 C  SpO2: 99% 98%    Last Pain:  Vitals:   05/05/17 1153  TempSrc:   PainSc: 0-No pain                 Keilly Fatula COKER

## 2017-05-06 ENCOUNTER — Encounter (HOSPITAL_COMMUNITY): Payer: Self-pay | Admitting: Cardiothoracic Surgery

## 2017-05-06 LAB — ACID FAST SMEAR (AFB, MYCOBACTERIA): Acid Fast Smear: NEGATIVE

## 2017-05-07 ENCOUNTER — Other Ambulatory Visit: Payer: Self-pay | Admitting: Oncology

## 2017-05-07 NOTE — Progress Notes (Unsigned)
I called [name] to let him know what we are seeing in the lymph nodes obtained through mediastinoscopy looks like granulomatous reaction not his lymphoma.  Accordingly we are canceling the treatment scheduled for next week.  I am going to obtain some additional lab work that day and he [name] see me to get those results.  At this point I think were going to continue treatment as we were doing previously

## 2017-05-08 ENCOUNTER — Other Ambulatory Visit: Payer: Self-pay | Admitting: Oncology

## 2017-05-08 ENCOUNTER — Telehealth: Payer: Self-pay | Admitting: Oncology

## 2017-05-08 DIAGNOSIS — C8319 Mantle cell lymphoma, extranodal and solid organ sites: Secondary | ICD-10-CM

## 2017-05-08 NOTE — Telephone Encounter (Signed)
Rescheduled appt per 4/4 sch msg - spoke with patient and confirmed appt.

## 2017-05-10 LAB — AEROBIC/ANAEROBIC CULTURE W GRAM STAIN (SURGICAL/DEEP WOUND): Culture: NO GROWTH

## 2017-05-10 NOTE — Progress Notes (Signed)
[facility]  [phone] [phone]) [phone]    ID: RICHRD KUZNIAR DOB: 12-17-1950  MR#: [id number]  JYN#:[id number]  Patient Care Team: Shelda Pal, DO as PCP - General (Family Medicine) Magrinat, Virgie Dad, MD as Consulting Physician (Oncology) Donnie Mesa, MD as Consulting Physician (General Surgery) Wilford Corner, MD as Consulting Physician (Gastroenterology) Ples Specter, MD as Referring Physician (Internal Medicine) Larina Earthly, MD as Referring Physician (Hematology and Oncology) Grace Isaac, MD as Consulting Physician (Cardiothoracic Surgery) PCP: Shelda Pal, DO OTHER MD: Napoleon Form MD  CHIEF COMPLAINT: 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 He had been treated on rituximab and ibrutinib. He reports that at the time of taking ibrutinib, he wasn't paying more than $6 for the prescription. These were held when PET scan 04/20/2017 showed progressive hypermetabolic mediastinal and hilar lymphadenopathy.  Note that there was no evidence of GI involvement.  We then referred him for mediastinoscopy and since his last visit to the office, he underwent video bronchoscopy with endobronchial ultrasound and mediastinoscopy under Ceasar Mons on 05/05/2017 ([id number]) with results showing. No malignant cells identified. Few areas suggestive of granulomatous inflammation.   Studies for acid-fast bacilli, aerobic and anaerobic bacteria, and fungus remain negative at the time of this dictation.  He also had a CXR completed on 05/04/17 with results showing: No acute cardiopulmonary abnormality. Mediastinal and hilar lymphadenopathy better demonstrated by PET-CT.  REVIEW OF SYSTEMS: Derren reports that for exercise, he recently cut his grass and  lifted weights this morning. He notes that he did well following his recent surgery. He reports that he had a mild cough following surgery that has resolved. He denies unusual headaches, visual changes, nausea, vomiting, or dizziness. There has been no unusual cough, phlegm production, or pleurisy. This been no change in bowel or bladder habits. He denies unexplained fatigue or unexplained weight loss, bleeding, rash, or fever. A detailed review of systems was otherwise stable.     HISTORY OF PRESENT ILLNESS: From the original intake note:   "[name]" was in his usual good health until early December 2016, when he had a brief syncopal episode. He "blew this off" and it did not happen again until 01/17/2015 when he felt weak at work and later fainted at home. He was brought to the ED where he was found to be guaiac positive (denies melena before that date) with a Hb of 4.5 MCV 82.2, platelets 295K, WBC 9.1 and normal INR and PTT. He was admitted and underwent EGD under Dr Michail Sermon 01/18/2015 showing a very large duodenal ulcer w clot. Abdominal US 01/19/2015 was not very informative but CT abd/pelvis 12/17,2016 showed adenopathy in the anterior mediastinum, retroperitoneum and pelvis, with a large ulcerated mass involving the distal stomach/proximal duodenum and extending into the gastrohepatic region.   On 01/22/2015 he underwent right inguinal lymph node biopsy, consistent with mantle cell lymphoma (([id number] and [id number])). Specifically the lymph node showed effacement of the architecture by sheets of small to medium sized lymphocytes with no apparent nodularity. The cells were positive for CD20, CD5, BCL-2, CD21, and cyclin D1. He 67 was low (10-20%). CD21 revealed a few residual germinal centers area at the B cells were negative for CD10, and BCL-6. CD23 was negative.  On 02/06/2015 the patient underwent a bone marrow aspirate and biopsy. The pathology  from this procedure (FZB 17-2 and 17-7) was  positive for involvement by non-Hodgkin's lymphoma with numerous atypical interstitial and paratrabecular lymphoid aggregates consistent with mantle cell lymphoma. Iron stain showed scant iron flow cytometry confirmed a monoclonal B-cell population, lambda restricted, consistent with the patient's known diagnosis of mantle cell lymphoma.  On 01/18/2015 the patient underwent EGD which found a massive duodenal bulb ulcer with adherent clot, although no active bleeding.his hemoglobin was in the 8.4 range and stable. He was discharged on 01/22/2015, but within 24 hours was readmitted with syncope and altered mental status. He had aspirated and required intubation for his respiratory failure. Hemoglobin was down to 6.3. An NG tube was placed, the patient was multiply transfused, and Surgery was considered but felt not to be feasible.accordingly the patient underwent arteriography 01/23/2015 which found the superior mesenteric and celiac arteries not to be the involved vessels. The gastroduodenal artery showed multiple branches providing supply to the hypervascular duodenal mass, and this artery was coil embolized.. Also a port was placed at the time of this procedure with a view to eventual chemotherapy.  He was discharged 01/26/2015,but required readmission 02/07/2015 for another presyncopal episode. He presented to the emergency room where he was found to have a hemoglobin of 7.5. The patient was transfusedIIA hemoglobin of 10 and was discharged 02/07/2014. He was scheduled to start CHOP right toxin 02/08/2014.  His subsequent history is as detailed below. PAST MEDICAL HISTORY: Past Medical History:  Diagnosis Date  . Anemia   . Cancer ([place])    Lymphoma  . Essential hypertension   . GERD (gastroesophageal reflux disease)   . GI bleed   . HLD (hyperlipidemia)   . Weakness     PAST SURGICAL HISTORY: Past Surgical History:  Procedure Laterality Date  . ESOPHAGOGASTRODUODENOSCOPY Left 01/18/2015    Procedure: ESOPHAGOGASTRODUODENOSCOPY (EGD);  Surgeon: Wilford Corner, MD;  Location: [facility] ENDOSCOPY;  Service: Endoscopy;  Laterality: Left;  . INGUINAL HERNIA REPAIR Right 01/22/2015   Procedure: RIGHT INGUINAL LYMPH NODE BX;  Surgeon: Donnie Mesa, MD;  Location: [facility];  Service: General;  Laterality: Right;  . MEDIASTINOSCOPY N/A 05/05/2017   Procedure: MEDIASTINOSCOPY;  Surgeon: Grace Isaac, MD;  Location: [facility];  Service: Thoracic;  Laterality: N/A;  . PORTACATH PLACEMENT  01/26/2015    power port with tip SVC/RA [place]  . SKIN SURGERY     Small benign cysts over left scalp removed  . VIDEO BRONCHOSCOPY WITH ENDOBRONCHIAL ULTRASOUND N/A 05/05/2017   Procedure: VIDEO BRONCHOSCOPY WITH ENDOBRONCHIAL ULTRASOUND;  Surgeon: Grace Isaac, MD;  Location: [facility] OR;  Service: Thoracic;  Laterality: N/A;    FAMILY HISTORY Family History  Problem Relation Age of Onset  . Hypertension Mother   . Hypertension Father   . Kidney failure Father   . Hypertension Sister   . Diabetes Sister   . Prostate cancer Brother   . Lupus Sister    The patient's father died from CHF complications age 69. The patient's mother is 37 y/o as of December 2016. The patient has 9 brothers, 8 sisters. One brother has prostate cancer. There is no other cancer history in the family to his knowledge.  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 His wife of 50 years, [name], is disabled due to RA. Daughter, [name] works as a Teacher, early years/pre for [employer] (she previously worked at the [employer] in records). Son, [name] owns a window washing business. Son,  [name] is in Press photographer at [employer]. All live in [place]. The patient has 3 grandchildren. He attends a [employer].    ADVANCED DIRECTIVES: Not in place   HEALTH MAINTENANCE: Social History   Tobacco Use  . Smoking status: Never Smoker  . Smokeless tobacco: Never Used  Substance Use Topics  .  Alcohol use: Yes    Comment: occasional beer  . Drug use: No     Colonoscopy:  PSA: 2.27 on 01/22/2015  Hepatitis serologies (B surface antigen, hepatitis B core antibody and hepatitis C IgM) negative 01/18/2015  No Known Allergies  Current Outpatient Medications  Medication Sig Dispense Refill  . Ibrutinib 420 MG TABS Take 420 mg by mouth daily. Take with a full glass of water at approximately the same time each day (Patient not taking: Reported on 05/04/2017) 84 tablet 4  . lisinopril-hydrochlorothiazide (PRINZIDE,ZESTORETIC) 20-25 MG tablet TAKE 1 TABLET BY MOUTH EVERY DAY 90 tablet 1  . lovastatin (MEVACOR) 20 MG tablet TAKE 1 TABLET BY MOUTH EVERYDAY AT BEDTIME (Patient taking differently: Take 20 mg by mouth at bedtime. TAKE 1 TABLET BY MOUTH EVERYDAY AT BEDTIME) 90 tablet 1  . naproxen sodium (ALEVE) 220 MG tablet Take 440 mg by mouth daily as needed (general pain).    . traMADol (ULTRAM) 50 MG tablet Take 2 tablets (100 mg total) by mouth every 6 (six) hours as needed. 15 tablet 0   No current facility-administered medications for this visit.     OBJECTIVE: Middle-aged African-American man in no acute distress  Vitals:   05/11/17 1235  BP: (!) 132/96  Pulse: 98  Resp: 18  Temp: 97.7 F (36.5 C)  SpO2: 100%     Body mass index is 29.67 kg/m.    ECOG FS:0 - Asymptomatic  Sclerae unicteric, pupils round and equal Oropharynx clear and moist; mediastinoscopy scar is healing nicely, with mild keloid formation No cervical or supraclavicular adenopathy, no axillary or inguinal adenopathy Lungs no rales or rhonchi Heart regular rate and rhythm Abd soft, nontender, positive bowel sounds MSK no focal spinal tenderness, no lymphedema Neuro: nonfocal, well oriented, appropriate affect    LAB RESULTS:  CMP     Component Value Date/Time   NA 139 05/04/2017 0937   NA 140 01/16/2017 0837   K 4.1 05/04/2017 0937   K 3.9 01/16/2017 0837   CL 105 05/04/2017 0937   CO2 24  05/04/2017 0937   CO2 24 01/16/2017 0837   GLUCOSE 100 (H) 05/04/2017 0937   GLUCOSE 102 01/16/2017 0837   BUN 19 05/04/2017 0937   BUN 20.4 01/16/2017 0837   CREATININE 1.00 05/04/2017 0937   CREATININE 1.2 01/16/2017 0837   CALCIUM 9.3 05/04/2017 0937   CALCIUM 8.9 01/16/2017 0837   PROT 6.6 05/04/2017 0937   PROT 6.9 01/16/2017 0837   ALBUMIN 4.0 05/04/2017 0937   ALBUMIN 4.0 01/16/2017 0837   AST 25 05/04/2017 0937   AST 20 01/16/2017 0837   ALT 25 05/04/2017 0937   ALT 22 01/16/2017 0837   ALKPHOS 36 (L) 05/04/2017 0937   ALKPHOS 47 01/16/2017 0837   BILITOT 0.6 05/04/2017 0937   BILITOT 0.36 01/16/2017 0837   GFRNONAA >60 05/04/2017 0937   GFRAA >60 05/04/2017 0937    INo results found for: SPEP, UPEP  Lab Results  Component Value Date   WBC 4.5 05/11/2017   NEUTROABS 2.8 05/11/2017   HGB 12.0 (L) 05/11/2017   HCT 37.6 (L) 05/11/2017   MCV 84.7 05/11/2017  PLT 310 05/11/2017      Chemistry      Component Value Date/Time   NA 139 05/04/2017 0937   NA 140 01/16/2017 0837   K 4.1 05/04/2017 0937   K 3.9 01/16/2017 0837   CL 105 05/04/2017 0937   CO2 24 05/04/2017 0937   CO2 24 01/16/2017 0837   BUN 19 05/04/2017 0937   BUN 20.4 01/16/2017 0837   CREATININE 1.00 05/04/2017 0937   CREATININE 1.2 01/16/2017 0837      Component Value Date/Time   CALCIUM 9.3 05/04/2017 0937   CALCIUM 8.9 01/16/2017 0837   ALKPHOS 36 (L) 05/04/2017 0937   ALKPHOS 47 01/16/2017 0837   AST 25 05/04/2017 0937   AST 20 01/16/2017 0837   ALT 25 05/04/2017 0937   ALT 22 01/16/2017 0837   BILITOT 0.6 05/04/2017 0937   BILITOT 0.36 01/16/2017 0837       No results found for: LABCA2  No components found for: WHQPR916  No results for input(s): INR in the last 168 hours.  Urinalysis    Component Value Date/Time   COLORURINE YELLOW 01/23/2015 0805   APPEARANCEUR CLEAR 01/23/2015 0805   LABSPEC 1.014 01/23/2015 0805   PHURINE 7.5 01/23/2015 0805   GLUCOSEU NEGATIVE  01/23/2015 0805   HGBUR NEGATIVE 01/23/2015 0805   BILIRUBINUR NEGATIVE 01/23/2015 0805   KETONESUR NEGATIVE 01/23/2015 0805   PROTEINUR NEGATIVE 01/23/2015 0805   NITRITE NEGATIVE 01/23/2015 0805   LEUKOCYTESUR NEGATIVE 01/23/2015 0805   Most recent beta-2 microglobulin and LDH obtained 04/23/2017 remains normal  STUDIES: Dg Chest 2 View  Result Date: 05/04/2017 CLINICAL DATA:  67 year old male preoperative study. Mantle cell lymphoma with progressive disease on PET-CT last month. EXAM: CHEST - 2 VIEW COMPARISON:  PET-CT 04/20/2017 and earlier. Chest radiographs in December 2016. FINDINGS: Right chest porta cath with no adverse features. Lung volumes and mediastinal contours are within normal limits; there is only mild hilar enlargement evident on the lateral view to correspond to the mediastinal and hilar lymphadenopathy seen by PET. Both lungs are clear. No pneumothorax or pleural effusion. No acute osseous abnormality identified. Upper abdominal embolization coils redemonstrated. Negative visible bowel gas pattern. IMPRESSION: No acute cardiopulmonary abnormality. Mediastinal and hilar lymphadenopathy better demonstrated by PET-CT. Electronically Signed   By: Genevie Ann M.D.   On: 05/04/2017 10:24   Nm Pet Image Restag (ps) Skull Base To Thigh  Result Date: 04/20/2017 CLINICAL DATA:  Subsequent treatment strategy for mantle cell lymphoma. EXAM: NUCLEAR MEDICINE PET SKULL BASE TO THIGH TECHNIQUE: 10.6 mCi F-18 FDG was injected intravenously. Full-ring PET imaging was performed from the skull base to thigh after the radiotracer. CT data was obtained and used for attenuation correction and anatomic localization. Fasting blood glucose: 98 mg/dl Mediastinal blood pool activity: SUV max 0.6 COMPARISON:  Multiple prior PET CTs.  The most recent is 02/02/2017 FINDINGS: NECK: No hypermetabolic lymph nodes in the neck. Significant motion artifact. Incidental CT findings: none CHEST: Progressive mediastinal  and hilar lymphadenopathy. 19.5 mm pretracheal lymph node on image number 64 previously measured 9.5 mm. SUV max is 15.4 and was previously 7.8. Right hilar node is difficult to measure without contrast but has definitely enlarged and SUV max is 19.6 and was previously 9.8. Subcarinal nodal mass measures 19.5 mm on image number 69 and previously measured 13.5 mm. SUV max is 18.9 and was previously 6.6. No worrisome pulmonary lesions. Incidental CT findings: none ABDOMEN/PELVIS: No abnormal hypermetabolic activity within the liver, pancreas, adrenal glands,  or spleen. No hypermetabolic lymph nodes in the abdomen or pelvis. No evidence of recurrent disease in the abdomen/pelvis. Incidental CT findings: none SKELETON: Diffuse marrow activity likely due to rebound from chemotherapy or marrow stimulating drugs. Incidental CT findings: none IMPRESSION: 1. Progressive hypermetabolic mediastinal and hilar lymphadenopathy as detailed above. 2. No findings for recurrent disease in the abdomen. Electronically Signed   By: Marijo Sanes M.D.   On: 04/20/2017 12:12   Pathology report discussed with the patient.  ASSESSMENT: 67 y.o. [place] man with a diagnosis of mantle cell non-Hodgkin's lymphoma of extranodal and solid organ sites presenting with syncope secondary to bleeding from a large ulcerated duodenal ulcer  (a) s/p coil embolization of the feeding (gastro-duodenal) artery 01/23/2015  (b) anemia--scant iron stores on bone marrow biopsy--s/p feraheme 02/13/2015  (1) right inguinal lymph node biopsy 01/22/2015 confirms mantle cell lymphoma  (a) bone marrow biopsy 02/06/2015 positive for involvement by the patient's mantle cell lymphoma  (b) IPI score of 5 (high risk) predicts a 5 year progression free survival of 50% with CHOP-Rituxan chemotherapy  (c) MIPI score of 5 (intermediate risk) predicts a median survival of 58 months  (2) CHOP/Rituxan started 02/09/2015, completed 8 cycles 07/05/2015  (3)  [facility]  consultation 04/06/2015. Patient opted against transplant consolidation  (4) rituximab maintenance started 08/16/2015, repeat every 2 months, discontinued February 2019  (a) PET scan had shown a suggestion of recurrence, but biopsy was negative  (5) PSA increase noted July 2017, back to baseline by December 2017  (6) restaging PET scan 11/17/2016 is consistent with disease progression  (a) repeat PET scan 02/02/2017 confirms increase in lymph nodes  (b) PET scan 04/20/2017 shows increasing hypermetabolic adenopathy  (c) bronchoscopy/mediastinoscopy 05/05/2017 shows granulomatous disease   (7) ibrutinib 420 mg daily started 02/09/2017, discontinued March 2019, resumed April 2019  (8) granulomatous inflammation noted on pathology from 05/05/2017 mediastinoscopy  (a) fungal bacterial and acid-fast studies negative to date  (b) consider sarcoid  PLAN: [name] is now 2-1/2 years out from his initial diagnosis of mantle cell lymphoma, with no evidence of active disease.  We did have a scare as the PET scan showed enlarged hypermetabolic mediastinal and hilar lymph nodes.  However the pathology does not confirm recurrent disease.  It suggests granulomatous inflammation, possibly sarcoid.  I discussed all this with [name] today.  He understands the cultures for AFB and fungi may take several weeks to become positive and we need to wait before deciding that they are completely negative.  If they are however we [name] consider sarcoid as a possible diagnosis for him.  If he does have sarcoid at this point he does not require treatment.  As far as his lymphoma is concerned, he did receive rituximab maintenance for a year and a half.  I think it would be easier for him to stay on ibrutinib, which was started January of this year.  He is tolerating it well.  We [name] flush his port every 8 weeks.  (It was flushed today).  We [name] repeat a PET scan in 2 months and he [name] see me a week after that to discuss  results  He knows to call for any problems that may develop before the next visit.    Magrinat, Virgie Dad, MD  05/11/17 12:54 PM Medical Oncology and Hematology [facility] [address] [place], [place] [address] Tel. [phone]    Fax. [phone]  This document serves as a record of services personally performed by  Lurline Del, MD. It was created on his behalf by Steva Colder, a trained medical scribe. The creation of this record is based on the scribe's personal observations and the provider's statements to them.   I have reviewed the above documentation for accuracy and completeness, and I agree with the above.

## 2017-05-11 ENCOUNTER — Inpatient Hospital Stay: Payer: BLUE CROSS/BLUE SHIELD

## 2017-05-11 ENCOUNTER — Telehealth: Payer: Self-pay | Admitting: Pharmacist

## 2017-05-11 ENCOUNTER — Ambulatory Visit: Payer: BLUE CROSS/BLUE SHIELD

## 2017-05-11 ENCOUNTER — Inpatient Hospital Stay: Payer: BLUE CROSS/BLUE SHIELD | Attending: Hematology and Oncology | Admitting: Oncology

## 2017-05-11 ENCOUNTER — Telehealth: Payer: Self-pay | Admitting: Oncology

## 2017-05-11 VITALS — BP 132/96 | HR 98 | Temp 97.7°F | Resp 18 | Ht 70.0 in | Wt 206.8 lb

## 2017-05-11 DIAGNOSIS — Z9221 Personal history of antineoplastic chemotherapy: Secondary | ICD-10-CM | POA: Insufficient documentation

## 2017-05-11 DIAGNOSIS — Z79899 Other long term (current) drug therapy: Secondary | ICD-10-CM

## 2017-05-11 DIAGNOSIS — E785 Hyperlipidemia, unspecified: Secondary | ICD-10-CM | POA: Diagnosis not present

## 2017-05-11 DIAGNOSIS — I10 Essential (primary) hypertension: Secondary | ICD-10-CM | POA: Insufficient documentation

## 2017-05-11 DIAGNOSIS — D649 Anemia, unspecified: Secondary | ICD-10-CM | POA: Diagnosis not present

## 2017-05-11 DIAGNOSIS — K219 Gastro-esophageal reflux disease without esophagitis: Secondary | ICD-10-CM | POA: Insufficient documentation

## 2017-05-11 DIAGNOSIS — C8319 Mantle cell lymphoma, extranodal and solid organ sites: Secondary | ICD-10-CM

## 2017-05-11 DIAGNOSIS — K276 Chronic or unspecified peptic ulcer, site unspecified, with both hemorrhage and perforation: Secondary | ICD-10-CM

## 2017-05-11 DIAGNOSIS — D71 Functional disorders of polymorphonuclear neutrophils: Secondary | ICD-10-CM | POA: Insufficient documentation

## 2017-05-11 LAB — CBC WITH DIFFERENTIAL/PLATELET
Basophils Absolute: 0 10*3/uL (ref 0.0–0.1)
Basophils Relative: 0 %
EOS ABS: 0.1 10*3/uL (ref 0.0–0.5)
EOS PCT: 2 %
HCT: 37.6 % — ABNORMAL LOW (ref 38.4–49.9)
Hemoglobin: 12 g/dL — ABNORMAL LOW (ref 13.0–17.1)
LYMPHS ABS: 1.1 10*3/uL (ref 0.9–3.3)
Lymphocytes Relative: 24 %
MCH: 27 pg — AB (ref 27.2–33.4)
MCHC: 31.9 g/dL — ABNORMAL LOW (ref 32.0–36.0)
MCV: 84.7 fL (ref 79.3–98.0)
MONO ABS: 0.5 10*3/uL (ref 0.1–0.9)
MONOS PCT: 11 %
Neutro Abs: 2.8 10*3/uL (ref 1.5–6.5)
Neutrophils Relative %: 63 %
PLATELETS: 310 10*3/uL (ref 140–400)
RBC: 4.44 MIL/uL (ref 4.20–5.82)
RDW: 14.6 % (ref 11.0–14.6)
WBC: 4.5 10*3/uL (ref 4.0–10.3)

## 2017-05-11 LAB — COMPREHENSIVE METABOLIC PANEL
ALT: 23 U/L (ref 0–55)
AST: 17 U/L (ref 5–34)
Albumin: 3.9 g/dL (ref 3.5–5.0)
Alkaline Phosphatase: 45 U/L (ref 40–150)
Anion gap: 9 (ref 3–11)
BUN: 18 mg/dL (ref 7–26)
CO2: 23 mmol/L (ref 22–29)
CREATININE: 1.03 mg/dL (ref 0.70–1.30)
Calcium: 9.5 mg/dL (ref 8.4–10.4)
Chloride: 104 mmol/L (ref 98–109)
GFR calc Af Amer: >60 mL/min (ref >60)
GLUCOSE: 89 mg/dL (ref 70–140)
Potassium: 4.3 mmol/L (ref 3.5–5.1)
SODIUM: 136 mmol/L (ref 136–145)
Total Bilirubin: 0.3 mg/dL (ref 0.2–1.2)
Total Protein: 7.3 g/dL (ref 6.4–8.3)

## 2017-05-11 LAB — LACTATE DEHYDROGENASE: LDH: 236 U/L (ref 125–245)

## 2017-05-11 MED FILL — IMBRUVICA 420 MG TAB: 420 | 28 days supply | Qty: 28 | Fill #3

## 2017-05-11 NOTE — Telephone Encounter (Signed)
Oral Chemotherapy Pharmacist Encounter   I spoke with patient in exam room for overview of: Imbruvica (ibrutinib) for the treatment of previously treatment mantle cell lymphoma, planned duration until disease progression or unacceptable toxicity.  Patient originally started on Imbruvica in January 2019, subsequently discontinued after enlarged hypermetabolic mediastinal and hilar lymph nodes seen on PET scan.   This is now confirmed to not be disease recurrence.  And patient will restart ibrutinib.  Pt is doing well. Counseled patienton administration, dosing, side effects, monitoring, drug-food interactions, safe handling, storage, and disposal.  Patient will take Imbruvica 420mg  tablets, 1 tablet (420mg ) by mouth once daily. Patient will take Imbruvica at approximately the same time each day with a full glass of water and maintain adequate hydration throughout the day. Patient knows to avoid grapefruit or grapefruit juice while on therapy with Imbruvica.  Imbruvica re-start date: 05/12/17  Side effects include but not limited to: bruising, decreased blood counts, N/V, diarrhea, musculoskeletal pain, arthralgias, peripheral edema, and hemorrhage.    Reviewed with patient importance of keeping a medication schedule and plan for any missed doses.  Mr. Hellums voiced understanding and appreciation.   All questions answered. Medication reconciliation performed and medication/allergy list updated.  Kate Sable will be shipped from the [facility] today, 05/11/2017, for delivery to patient's home on Tuesday, 05/12/2017.  Patient states that he does have some tablets remaining at the house and will be able to restart on home supply tomorrow even if his pharmacy supply of Imbruvica does not show up then.  Patient knows to call the office with questions or concerns. Oral Oncology Clinic will continue to follow.  Thank you,  Johny Drilling, PharmD, BCPS, BCOP Oral Chemo  Clinic: [phone] 05/11/2017 1:27 PM

## 2017-05-11 NOTE — Telephone Encounter (Signed)
Gave avs and calendar ° °

## 2017-05-12 ENCOUNTER — Ambulatory Visit: Payer: BLUE CROSS/BLUE SHIELD

## 2017-05-12 LAB — VITAMIN D 25 HYDROXY (VIT D DEFICIENCY, FRACTURES): Vit D, 25-Hydroxy: 25.6 ng/mL — ABNORMAL LOW (ref 30.0–100.0)

## 2017-05-12 LAB — CALCIUM / CREATININE RATIO, URINE
CALCIUM UR: 4.2 mg/dL
CALCIUM/CREAT. RATIO: 34 mg/g{creat} (ref 0–260)
CREATININE, UR: 122.3 mg/dL

## 2017-05-12 LAB — ANGIOTENSIN CONVERTING ENZYME

## 2017-05-12 NOTE — Op Note (Signed)
NAME:  NALIN, MAZZOCCO NO.:  MEDICAL RECORD NO.:  [id number]  LOCATION:                                 FACILITY:  PHYSICIAN:  Lanelle Bal, MD         DATE OF BIRTH:  DATE OF PROCEDURE:  05/05/2017 DATE OF DISCHARGE:                              OPERATIVE REPORT   PREOPERATIVE DIAGNOSES:  Mediastinal adenopathy, history of lymphoma.  POSTOPERATIVE DIAGNOSES:  Mediastinal adenopathy, history of lymphoma.  SURGICAL PROCEDURES:  Video bronchoscopy with endobronchial ultrasound and transbronchial biopsy of 4R and #7 nodes and mediastinoscopy with biopsy of mediastinal lymph nodes.  SURGEON:  Lanelle Bal, MD.  BRIEF HISTORY:  The patient is a 67 year old male with previous history of mantle cell lymphoma on serial CT scans and PET scans.  The patient had had progressive metabolic activity in his mediastinal lymph nodes. He was referred by Dr. Jana Hakim for consideration of biopsy.  Risks and options were discussed with him including the possible need for mediastinoscopy with the potential diagnosis of recurrent lymphoma.  DESCRIPTION OF PROCEDURE:  After the patient signed informed consent, he was taken to the operating room, underwent general endotracheal anesthesia.  Appropriate time-out was performed.  We then proceeded with video bronchoscopy.  The tracheobronchial tree was examined to the subsegmental level, both left and right, without evidence of endobronchial lesions.  We then placed an EBUS scope and were able to identify a #7 and 4R nodes easily.  Transbronchial biopsies were done with aspirating needle.  Cytologies of 4R and 7 nodes were done. Initial quick smear was nondiagnostic.  We then proceeded with formal mediastinoscopy.  The patient's neck was prepped with Betadine and draped in a sterile manner.  Small transverse incisions were made in the suprasternal notch.  Dissection was then carried down to the pretracheal fascia, which  was bluntly dissected into the upper mediastinum.  The video mediastinoscope was then placed; and 7 nodes and 4R nodes, multiple biopsies were obtained and sent to Pathology.  Initial frozen section suggested adequate lymphoid tissue in the sample, but no definitive diagnosis could be made.  Additional tissue was sent for permanent sections and for possible flow cytometry as deemed necessary. With operative field hemostatic, the scope was removed.  The deep cervical fascia was closed with interrupted 0 Vicryl, running 3-0 Vicryl in subcutaneous tissue, and 3-0 subcuticular stitch in the skin edges. Dermabond was applied.  The patient was awakened and extubated in the operating room.  Sponge and needle count was reported as correct at the completion of procedure.  The patient tolerated the procedure without obvious complication and was transferred to the recovery room after extubation, having tolerated the procedure. Estimated blood loss was minimal.  Sponge and needle count was reported as correct.     Lanelle Bal, MD     EG/MEDQ  D:  05/11/2017  T:  05/11/2017  Job:  [id number]

## 2017-05-18 ENCOUNTER — Ambulatory Visit: Payer: BLUE CROSS/BLUE SHIELD

## 2017-05-26 LAB — CULTURE, FUNGUS WITHOUT SMEAR

## 2017-06-17 LAB — ACID FAST CULTURE WITH REFLEXED SENSITIVITIES (MYCOBACTERIA): Acid Fast Culture: NEGATIVE

## 2017-06-19 MED FILL — IMBRUVICA 420 MG TAB: 420 | 28 days supply | Qty: 28 | Fill #4

## 2017-07-06 ENCOUNTER — Inpatient Hospital Stay: Payer: BLUE CROSS/BLUE SHIELD

## 2017-07-06 ENCOUNTER — Ambulatory Visit (HOSPITAL_COMMUNITY)
Admission: RE | Admit: 2017-07-06 | Discharge: 2017-07-06 | Disposition: A | Discharge status: Home / Self Care | Payer: BLUE CROSS/BLUE SHIELD | Source: Ambulatory Visit | Attending: Oncology | Admitting: Oncology

## 2017-07-06 ENCOUNTER — Other Ambulatory Visit: Payer: Self-pay | Admitting: Oncology

## 2017-07-06 ENCOUNTER — Inpatient Hospital Stay: Payer: BLUE CROSS/BLUE SHIELD | Attending: Hematology and Oncology

## 2017-07-06 DIAGNOSIS — Z9221 Personal history of antineoplastic chemotherapy: Secondary | ICD-10-CM | POA: Diagnosis not present

## 2017-07-06 DIAGNOSIS — K276 Chronic or unspecified peptic ulcer, site unspecified, with both hemorrhage and perforation: Secondary | ICD-10-CM | POA: Diagnosis present

## 2017-07-06 DIAGNOSIS — R55 Syncope and collapse: Secondary | ICD-10-CM | POA: Insufficient documentation

## 2017-07-06 DIAGNOSIS — C8319 Mantle cell lymphoma, extranodal and solid organ sites: Secondary | ICD-10-CM | POA: Diagnosis not present

## 2017-07-06 DIAGNOSIS — E785 Hyperlipidemia, unspecified: Secondary | ICD-10-CM | POA: Insufficient documentation

## 2017-07-06 DIAGNOSIS — R590 Localized enlarged lymph nodes: Secondary | ICD-10-CM | POA: Insufficient documentation

## 2017-07-06 DIAGNOSIS — D649 Anemia, unspecified: Secondary | ICD-10-CM | POA: Insufficient documentation

## 2017-07-06 DIAGNOSIS — K264 Chronic or unspecified duodenal ulcer with hemorrhage: Secondary | ICD-10-CM | POA: Insufficient documentation

## 2017-07-06 DIAGNOSIS — Z79899 Other long term (current) drug therapy: Secondary | ICD-10-CM | POA: Insufficient documentation

## 2017-07-06 DIAGNOSIS — K219 Gastro-esophageal reflux disease without esophagitis: Secondary | ICD-10-CM | POA: Insufficient documentation

## 2017-07-06 DIAGNOSIS — Z95828 Presence of other vascular implants and grafts: Secondary | ICD-10-CM

## 2017-07-06 DIAGNOSIS — I10 Essential (primary) hypertension: Secondary | ICD-10-CM | POA: Insufficient documentation

## 2017-07-06 LAB — CBC WITH DIFFERENTIAL/PLATELET
Basophils Absolute: 0 10*3/uL (ref 0.0–0.1)
Basophils Relative: 1 %
EOS ABS: 0 10*3/uL (ref 0.0–0.5)
Eosinophils Relative: 1 %
HEMATOCRIT: 38.2 % — AB (ref 38.4–49.9)
HEMOGLOBIN: 12.5 g/dL — AB (ref 13.0–17.1)
LYMPHS ABS: 0.6 10*3/uL — AB (ref 0.9–3.3)
Lymphocytes Relative: 17 %
MCH: 27.5 pg (ref 27.2–33.4)
MCHC: 32.7 g/dL (ref 32.0–36.0)
MCV: 84.2 fL (ref 79.3–98.0)
MONO ABS: 0.7 10*3/uL (ref 0.1–0.9)
Monocytes Relative: 18 %
NEUTROS ABS: 2.4 10*3/uL (ref 1.5–6.5)
NEUTROS PCT: 63 %
Platelets: 295 10*3/uL (ref 140–400)
RBC: 4.54 MIL/uL (ref 4.20–5.82)
RDW: 14.4 % (ref 11.0–14.6)
WBC: 3.8 10*3/uL — ABNORMAL LOW (ref 4.0–10.3)

## 2017-07-06 LAB — COMPREHENSIVE METABOLIC PANEL
ALBUMIN: 4.1 g/dL (ref 3.5–5.0)
ALK PHOS: 41 U/L (ref 40–150)
ALT: 18 U/L (ref 0–55)
AST: 18 U/L (ref 5–34)
Anion gap: 9 (ref 3–11)
BUN: 20 mg/dL (ref 7–26)
CALCIUM: 9 mg/dL (ref 8.4–10.4)
CHLORIDE: 104 mmol/L (ref 98–109)
CO2: 24 mmol/L (ref 22–29)
CREATININE: 1.03 mg/dL (ref 0.70–1.30)
GFR calc non Af Amer: >60 mL/min (ref >60)
GLUCOSE: 94 mg/dL (ref 70–140)
Potassium: 4.1 mmol/L (ref 3.5–5.1)
Sodium: 137 mmol/L (ref 136–145)
Total Bilirubin: 0.5 mg/dL (ref 0.2–1.2)
Total Protein: 6.9 g/dL (ref 6.4–8.3)

## 2017-07-06 LAB — LACTATE DEHYDROGENASE: LDH: 236 U/L (ref 125–245)

## 2017-07-06 LAB — GLUCOSE, CAPILLARY: Glucose-Capillary: 94 mg/dL (ref 65–99)

## 2017-07-06 IMAGING — PT NM PET TUM IMG INITIAL (PI) SKULL BASE T - THIGH
1 of 8 series · 1 of 25 positions shown · non-contrast
Comparison: [DATE]

CLINICAL DATA: Subsequent treatment strategy for mantle cell
non-Hodgkin's lymphoma. Status post induction chemotherapy.

EXAM:
NUCLEAR MEDICINE PET SKULL BASE TO THIGH
TECHNIQUE: 10.3 mCi F-18 FDG was injected intravenously. Full-ring PET imaging
was performed from the skull base to thigh after the radiotracer. CT
data was obtained and used for attenuation correction and anatomic
localization.
Fasting blood glucose: 94 mg/dl

[Series 3: pet sk_thigh ac · axial · 5.0mm · 4.07mm/px · 1 of 217 slices shown]
[im 145/217]
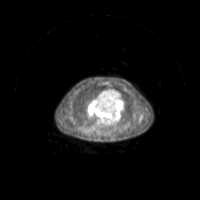

[1 of 25 positions shown; findings below may reference images not displayed]

FINDINGS: (Mediastinal blood pool activity: SUV max = 2.5)

NECK:  No hypermetabolic lymph nodes or masses.

Incidental CT findings:  None.

CHEST: 12 mm right supraclavicular lymph node on image 37/4 shows no
significant change in size, but shows markedly increased
hypermetabolic activity, with SUV max of 12.0 compared to
previously.

Mild increase in size of mediastinal lymphadenopathy is seen, with
index lymph node in the precarinal region measuring 2.4 cm on image
60/4 compared to 2.1 cm previously, and index subcarinal lymph node
measuring 2.0 cm on image 70/4 compared to 1.5 cm previously. These
mediastinal nodes show no significant change in degree of
hypermetabolic activity. Index precarinal lymph node has SUV max of
16.0 compared to 15.4 previously. Index subcarinal lymph node has
SUV max of 15.8 compared to 18.9 previously. Hypermetabolic
bilateral hilar lymphadenopathy also shows no significant change,
with SUV max in the right hilum measuring 14.2 compared to
previously.

No suspicious pulmonary nodules identified on CT images.

Incidental CT findings:  Aortic and coronary artery atherosclerosis.

ABDOMEN/PELVIS: No abnormal hypermetabolic activity within the
liver, pancreas, adrenal glands, or spleen. No hypermetabolic lymph
nodes in the abdomen or pelvis.

Incidental CT findings:  None.

SKELETON: No focal hypermetabolic bone lesions to suggest skeletal
metastasis.

Incidental CT findings:  None.
IMPRESSION: Mildly increased size of mediastinal lymphadenopathy, without
significant change in overall degree of hypermetabolic activity.

No significant change in hypermetabolic bilateral hilar
lymphadenopathy.

1.2 cm right supraclavicular lymph node is stable in size, but shows
increased hypermetabolic activity.

No new sites of hypermetabolic lymphadenopathy identified.

## 2017-07-06 MED ORDER — FLUDEOXYGLUCOSE F - 18 (FDG) INJECTION
10.2500 | Freq: Once | INTRAVENOUS | Status: AC | PRN
  Administered 2017-07-06: 10.25 via INTRAVENOUS

## 2017-07-06 MED ORDER — HEPARIN SOD (PORK) LOCK FLUSH 100 UNIT/ML IV SOLN
500.0000 [IU] | Freq: Once | INTRAVENOUS | Status: DC
  Filled 2017-07-06: qty 5

## 2017-07-06 MED ORDER — SODIUM CHLORIDE 0.9% FLUSH
10.0000 mL | INTRAVENOUS | Status: DC | PRN
  Administered 2017-07-06: 10 mL via INTRAVENOUS
  Filled 2017-07-06: qty 10

## 2017-07-06 NOTE — Progress Notes (Signed)
Called will and gave him results of his scan which is basically stable, but to be discussed in more detail at the upcoming visit

## 2017-07-07 LAB — BETA 2 MICROGLOBULIN, SERUM: BETA 2 MICROGLOBULIN: 1.6 mg/L (ref 0.6–2.4)

## 2017-07-10 NOTE — Progress Notes (Addendum)
[name]  [phone] [phone]    ID: [name] DOB: Sep 09, 1950  MR#: [id number]  PPJ#:[id number]  Patient Care Team: Shelda Pal, DO as PCP - General (Family Medicine) Avereigh Spainhower, Virgie Dad, MD as Consulting Physician (Oncology) Donnie Mesa, MD as Consulting Physician (General Surgery) Wilford Corner, MD as Consulting Physician (Gastroenterology) Ples Specter, MD as Referring Physician (Internal Medicine) Larina Earthly, MD as Referring Physician (Hematology and Oncology) Grace Isaac, MD as Consulting Physician (Cardiothoracic Surgery) PCP: Shelda Pal, DO OTHER MD: Napoleon Form MD; Dr Glennon Mac [phone]  CHIEF COMPLAINT: 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 He continues on rituximab and ibrutinib. He tolerates this well. He denies any "B-symptoms" including unexplained weight loss, drenching sweats, fatigue, fevers, or lymphadenopathy.   Since his last visit, he completed a PET scan on 07/06/2017 showing: Mildly increased size of mediastinal lymphadenopathy, without significant change in overall degree of hypermetabolic activity. No significant change in hypermetabolic bilateral hilar lymphadenopathy. 1.2 cm right supraclavicular lymph node is stable in size, but shows increased hypermetabolic activity. No new sites of hypermetabolic lymphadenopathy identified.  He is here today to discuss those results   REVIEW OF SYSTEMS: [name] reports that he has occasional cramping in his fingers and legs. He feels that he need to drink more water. He eats fruit most days.  He denies any recent dental work or any dental problems.  He denies any change in bowel movements. He notes that he has spent some time outside.  He denies  unusual headaches, visual changes, nausea, vomiting, or dizziness. There has been no unusual cough, phlegm production, or pleurisy. This been no change in bowel or bladder habits. He denies unexplained fatigue or unexplained weight loss, bleeding, rash, or fever. A detailed review of systems was otherwise stable.    HISTORY OF PRESENT ILLNESS: From the original intake note:   "[name]" was in his usual good health until early December 2016, when he had a brief syncopal episode. He "blew this off" and it did not happen again until 01/17/2015 when he felt weak at work and later fainted at home. He was brought to the ED where he was found to be guaiac positive (denies melena before that date) with a Hb of 4.5 MCV 82.2, platelets 295K, WBC 9.1 and normal INR and PTT. He was admitted and underwent EGD under Dr Michail Sermon 01/18/2015 showing a very large duodenal ulcer w clot. Abdominal US 01/19/2015 was not very informative but CT abd/pelvis 12/17,2016 showed adenopathy in the anterior mediastinum, retroperitoneum and pelvis, with a large ulcerated mass involving the distal stomach/proximal duodenum and extending into the gastrohepatic region.   On 01/22/2015 he underwent right inguinal lymph node biopsy, consistent with mantle cell lymphoma (([id number] and [id number])). Specifically the lymph node showed effacement of the architecture by sheets of small to medium sized lymphocytes with no apparent nodularity. The cells were positive for CD20, CD5, BCL-2, CD21, and cyclin D1. He 67 was low (10-20%). CD21 revealed a few residual germinal centers area at the B cells were negative for CD10, and BCL-6. CD23 was negative.  On 02/06/2015 the patient underwent a bone marrow aspirate and biopsy. The pathology from this procedure (FZB 17-2 and 17-7) was positive for involvement by non-Hodgkin's lymphoma with numerous atypical interstitial and paratrabecular lymphoid aggregates consistent with  mantle cell lymphoma. Iron stain  showed scant iron flow cytometry confirmed a monoclonal B-cell population, lambda restricted, consistent with the patient's known diagnosis of mantle cell lymphoma.  On 01/18/2015 the patient underwent EGD which found a massive duodenal bulb ulcer with adherent clot, although no active bleeding.his hemoglobin was in the 8.4 range and stable. He was discharged on 01/22/2015, but within 24 hours was readmitted with syncope and altered mental status. He had aspirated and required intubation for his respiratory failure. Hemoglobin was down to 6.3. An NG tube was placed, the patient was multiply transfused, and Surgery was considered but felt not to be feasible.accordingly the patient underwent arteriography 01/23/2015 which found the superior mesenteric and celiac arteries not to be the involved vessels. The gastroduodenal artery showed multiple branches providing supply to the hypervascular duodenal mass, and this artery was coil embolized.. Also a port was placed at the time of this procedure with a view to eventual chemotherapy.  He was discharged 01/26/2015,but required readmission 02/07/2015 for another presyncopal episode. He presented to the emergency room where he was found to have a hemoglobin of 7.5. The patient was transfusedIIA hemoglobin of 10 and was discharged 02/07/2014. He was scheduled to start CHOP right toxin 02/08/2014.  His subsequent history is as detailed below. PAST MEDICAL HISTORY: Past Medical History:  Diagnosis Date  . Anemia   . Cancer ([place])    Lymphoma  . Essential hypertension   . GERD (gastroesophageal reflux disease)   . GI bleed   . HLD (hyperlipidemia)   . Weakness     PAST SURGICAL HISTORY: Past Surgical History:  Procedure Laterality Date  . ESOPHAGOGASTRODUODENOSCOPY Left 01/18/2015   Procedure: ESOPHAGOGASTRODUODENOSCOPY (EGD);  Surgeon: Wilford Corner, MD;  Location: [facility];  Service: Endoscopy;  Laterality: Left;  . INGUINAL HERNIA REPAIR Right  01/22/2015   Procedure: RIGHT INGUINAL LYMPH NODE BX;  Surgeon: Donnie Mesa, MD;  Location: [place];  Service: General;  Laterality: Right;  . MEDIASTINOSCOPY N/A 05/05/2017   Procedure: MEDIASTINOSCOPY;  Surgeon: Grace Isaac, MD;  Location: [place];  Service: Thoracic;  Laterality: N/A;  . PORTACATH PLACEMENT  01/26/2015    power port with tip SVC/RA [place]  . SKIN SURGERY     Small benign cysts over left scalp removed  . VIDEO BRONCHOSCOPY WITH ENDOBRONCHIAL ULTRASOUND N/A 05/05/2017   Procedure: VIDEO BRONCHOSCOPY WITH ENDOBRONCHIAL ULTRASOUND;  Surgeon: Grace Isaac, MD;  Location[facility];  Service: Thoracic;  Laterality: N/A;    FAMILY HISTORY Family History  Problem Relation Age of Onset  . Hypertension Mother   . Hypertension Father   . Kidney failure Father   . Hypertension Sister   . Diabetes Sister   . Prostate cancer Brother   . Lupus Sister    The patient's father died from CHF complications age 65. The patient's mother is 78 y/o as of December 2016. The patient has 9 brothers, 8 sisters. One brother has prostate cancer. There is no other cancer history in the family to his knowledge.  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 His wife of 9 years, [name], is disabled due to RA. Daughter, [name] works as a Teacher, early years/pre for [employer] (she previously worked at the [employer] in records). Son, [name] owns a window washing business. Son, [name] is in Press photographer at [employer]. All live in [place]. The patient has 3 grandchildren. He attends a [employer].  ADVANCED DIRECTIVES: Not in place   HEALTH MAINTENANCE: Social History   Tobacco Use  . Smoking status: Never Smoker  . Smokeless tobacco: Never Used  Substance Use Topics  . Alcohol use: Yes    Comment: occasional beer  . Drug use: No     Colonoscopy:  PSA: 2.27 on 01/22/2015  Hepatitis serologies (B surface antigen, hepatitis B core  antibody and hepatitis C IgM) negative 01/18/2015  No Known Allergies  Current Outpatient Medications  Medication Sig Dispense Refill  . Ibrutinib 420 MG TABS Take 420 mg by mouth daily. Take with a full glass of water at approximately the same time each day (Patient not taking: Reported on 05/04/2017) 84 tablet 4  . lisinopril-hydrochlorothiazide (PRINZIDE,ZESTORETIC) 20-25 MG tablet TAKE 1 TABLET BY MOUTH EVERY DAY 90 tablet 1  . lovastatin (MEVACOR) 20 MG tablet TAKE 1 TABLET BY MOUTH EVERYDAY AT BEDTIME (Patient taking differently: Take 20 mg by mouth at bedtime. TAKE 1 TABLET BY MOUTH EVERYDAY AT BEDTIME) 90 tablet 1   No current facility-administered medications for this visit.     OBJECTIVE: Middle-aged African-American man who appears well  Vitals:   07/13/17 0825  BP: (!) 146/98  Pulse: 71  Resp: 18  Temp: 98.5 F (36.9 C)  SpO2: (!) 10%     Body mass index is 29.64 kg/m.    ECOG FS:0 - Asymptomatic  Sclerae unicteric, EOMs intact Oropharynx clear and moist No cervical or supraclavicular adenopathy--specifically no palpable right supraclavicular adenopathy lungs no rales or rhonchi Heart regular rate and rhythm Abd soft, nontender, positive bowel sounds MSK no focal spinal tenderness, no upper extremity lymphedema Neuro: nonfocal, well oriented, appropriate affect  LAB RESULTS:  CMP     Component Value Date/Time   NA 137 07/06/2017 0743   NA 140 01/16/2017 0837   K 4.1 07/06/2017 0743   K 3.9 01/16/2017 0837   CL 104 07/06/2017 0743   CO2 24 07/06/2017 0743   CO2 24 01/16/2017 0837   GLUCOSE 94 07/06/2017 0743   GLUCOSE 102 01/16/2017 0837   BUN 20 07/06/2017 0743   BUN 20.4 01/16/2017 0837   CREATININE 1.03 07/06/2017 0743   CREATININE 1.2 01/16/2017 0837   CALCIUM 9.0 07/06/2017 0743   CALCIUM 8.9 01/16/2017 0837   PROT 6.9 07/06/2017 0743   PROT 6.9 01/16/2017 0837   ALBUMIN 4.1 07/06/2017 0743   ALBUMIN 4.0 01/16/2017 0837   AST 18 07/06/2017 0743     AST 20 01/16/2017 0837   ALT 18 07/06/2017 0743   ALT 22 01/16/2017 0837   ALKPHOS 41 07/06/2017 0743   ALKPHOS 47 01/16/2017 0837   BILITOT 0.5 07/06/2017 0743   BILITOT 0.36 01/16/2017 0837   GFRNONAA >60 07/06/2017 0743   GFRAA >60 07/06/2017 0743    INo results found for: SPEP, UPEP  Lab Results  Component Value Date   WBC 3.8 (L) 07/06/2017   NEUTROABS 2.4 07/06/2017   HGB 12.5 (L) 07/06/2017   HCT 38.2 (L) 07/06/2017   MCV 84.2 07/06/2017   PLT 295 07/06/2017      Chemistry      Component Value Date/Time   NA 137 07/06/2017 0743   NA 140 01/16/2017 0837   K 4.1 07/06/2017 0743   K 3.9 01/16/2017 0837   CL 104 07/06/2017 0743   CO2 24 07/06/2017 0743   CO2 24 01/16/2017 0837   BUN 20 07/06/2017 0743   BUN 20.4 01/16/2017 0837   CREATININE 1.03 07/06/2017 0743  CREATININE 1.2 01/16/2017 0837      Component Value Date/Time   CALCIUM 9.0 07/06/2017 0743   CALCIUM 8.9 01/16/2017 0837   ALKPHOS 41 07/06/2017 0743   ALKPHOS 47 01/16/2017 0837   AST 18 07/06/2017 0743   AST 20 01/16/2017 0837   ALT 18 07/06/2017 0743   ALT 22 01/16/2017 0837   BILITOT 0.5 07/06/2017 0743   BILITOT 0.36 01/16/2017 0837       No results found for: LABCA2  No components found for: [id number]  No results for input(s): INR in the last 168 hours.  Urinalysis    Component Value Date/Time   COLORURINE YELLOW 01/23/2015 0805   APPEARANCEUR CLEAR 01/23/2015 0805   LABSPEC 1.014 01/23/2015 0805   PHURINE 7.5 01/23/2015 0805   GLUCOSEU NEGATIVE 01/23/2015 0805   HGBUR NEGATIVE 01/23/2015 0805   BILIRUBINUR NEGATIVE 01/23/2015 0805   KETONESUR NEGATIVE 01/23/2015 0805   PROTEINUR NEGATIVE 01/23/2015 0805   NITRITE NEGATIVE 01/23/2015 0805   LEUKOCYTESUR NEGATIVE 01/23/2015 0805   Most recent beta-2 microglobulin and LDH obtained 04/23/2017 remains normal  STUDIES: Nm Pet Image Initial (pi) Skull Base To Thigh  Result Date: 07/06/2017 CLINICAL DATA:  Subsequent  treatment strategy for mantle cell non-Hodgkin's lymphoma. Status post induction chemotherapy. EXAM: NUCLEAR MEDICINE PET SKULL BASE TO THIGH TECHNIQUE: 10.3 mCi F-18 FDG was injected intravenously. Full-ring PET imaging was performed from the skull base to thigh after the radiotracer. CT data was obtained and used for attenuation correction and anatomic localization. Fasting blood glucose: 94 mg/dl COMPARISON:  04/20/2017 FINDINGS: (Mediastinal blood pool activity: SUV max = 2.5) NECK:  No hypermetabolic lymph nodes or masses. Incidental CT findings:  None. CHEST: 12 mm right supraclavicular lymph node on image 37/4 shows no significant change in size, but shows markedly increased hypermetabolic activity, with SUV max of 12.0 compared to 1.2 previously. Mild increase in size of mediastinal lymphadenopathy is seen, with index lymph node in the precarinal region measuring 2.4 cm on image 60/4 compared to 2.1 cm previously, and index subcarinal lymph node measuring 2.0 cm on image 70/4 compared to 1.5 cm previously. These mediastinal nodes show no significant change in degree of hypermetabolic activity. Index precarinal lymph node has SUV max of 16.0 compared to 15.4 previously. Index subcarinal lymph node has SUV max of 15.8 compared to 18.9 previously. Hypermetabolic bilateral hilar lymphadenopathy also shows no significant change, with SUV max in the right hilum measuring 14.2 compared to 15.4 previously. No suspicious pulmonary nodules identified on CT images. Incidental CT findings:  Aortic and coronary artery atherosclerosis. ABDOMEN/PELVIS: No abnormal hypermetabolic activity within the liver, pancreas, adrenal glands, or spleen. No hypermetabolic lymph nodes in the abdomen or pelvis. Incidental CT findings:  None. SKELETON: No focal hypermetabolic bone lesions to suggest skeletal metastasis. Incidental CT findings:  None. IMPRESSION: Mildly increased size of mediastinal lymphadenopathy, without significant  change in overall degree of hypermetabolic activity. No significant change in hypermetabolic bilateral hilar lymphadenopathy. 1.2 cm right supraclavicular lymph node is stable in size, but shows increased hypermetabolic activity. No new sites of hypermetabolic lymphadenopathy identified. Electronically Signed   By: Earle Gell M.D.   On: 07/06/2017 11:56   Pathology report discussed with the patient.  ASSESSMENT: 67 y.o. [place] man with a diagnosis of mantle cell non-Hodgkin's lymphoma of extranodal and solid organ sites presenting with syncope secondary to bleeding from a large ulcerated duodenal ulcer  (a) s/p coil embolization of the feeding (gastro-duodenal) artery 01/23/2015  (b) anemia--scant iron stores  on bone marrow biopsy--s/p feraheme 02/13/2015  (1) right inguinal lymph node biopsy 01/22/2015 confirms mantle cell lymphoma  (a) bone marrow biopsy 02/06/2015 positive for involvement by the patient's mantle cell lymphoma  (b) IPI score of 5 (high risk) predicts a 5 year progression free survival of 50% with CHOP-Rituxan chemotherapy  (c) MIPI score of 5 (intermediate risk) predicts a median survival of 58 months  (2) CHOP/Rituxan started 02/09/2015, completed 8 cycles 07/05/2015  (3)  [facility] consultation 04/06/2015. Patient opted against transplant consolidation  (4) rituximab maintenance started 08/16/2015, repeat every 2 months, discontinued February 2019  (a) PET scan had shown a suggestion of recurrence, but biopsy was negative  (5) PSA increase noted July 2017, back to baseline by December 2017  (6) restaging PET scan 11/17/2016 is consistent with disease progression  (a) repeat PET scan 02/02/2017 confirms increase in lymph nodes  (b) PET scan 04/20/2017 shows increasing hypermetabolic adenopathy  (c) bronchoscopy/mediastinoscopy 05/05/2017 shows granulomatous disease, with negative AFB/ GMS stains; <10% of lymphocytes are B-cell with slight lambda excess  (d) PET scan on  07/06/2017 shows mild increase in mediastinal lymphadenopathy. Increased hypermetabolic supraclavicular lymph node.   (7) ibrutinib 420 mg daily started 02/09/2017, discontinued March 2019, resumed April 2019  (8) granulomatous inflammation noted on pathology from 05/05/2017 mediastinoscopy  (a) fungal bacterial and acid-fast studies negative to date  (b) consider sarcoid  PLAN: [name] is now 2-1/2 years out from initial diagnosis of mantle cell lymphoma.  He is tolerating the ibrutinib well.  The problem is that the recent PET scan shows some increase in size although not in SUV in the mediastinal nodes, and one hot right supraclavicular lymph node which measures 1.2 cm.  I do not think we are going to be able to surgically approach the right supraclavicular lymph node without great difficulty.  We discussed several possibilities including simply repeating a PET in 6 months, increasing the ibrutinib dose to 560, increasing the ibrutinib dose and also adding right toxin, or going to some kind of chemo immunotherapy.  After much discussion we decided the simplest thing to do would be to increase the ibrutinib.  He is going to start 560 mg daily and I wrote the prescription so he can start immediately.  He [name] have lab work and a repeat PET scan in the last week in July and see me the first Monday in August.  Depending on those results we [name] again consider these options.  I do not think the cramps he is having are going to be related to the ibrutinib.  I recommended that he drink more water and I am getting a magnesium level with his next set of labs  He knows to call for any other issues that may develop before the next visit.    Trellis Vanoverbeke, Virgie Dad, MD  07/13/17 8:53 AM Medical Oncology and Hematology [facility] [address] [place], [place] [address] Tel. [phone]    Fax. [phone]  Alice Rieger, am acting as scribe for Chauncey Cruel MD.  I,  Lurline Del MD, have reviewed the above documentation for accuracy and completeness, and I agree with the above.

## 2017-07-13 ENCOUNTER — Other Ambulatory Visit: Payer: Self-pay | Admitting: Oncology

## 2017-07-13 ENCOUNTER — Telehealth: Payer: Self-pay | Admitting: Oncology

## 2017-07-13 ENCOUNTER — Encounter: Payer: Self-pay | Admitting: Oncology

## 2017-07-13 ENCOUNTER — Inpatient Hospital Stay (HOSPITAL_BASED_OUTPATIENT_CLINIC_OR_DEPARTMENT_OTHER): Payer: BLUE CROSS/BLUE SHIELD | Admitting: Oncology

## 2017-07-13 DIAGNOSIS — Z9221 Personal history of antineoplastic chemotherapy: Secondary | ICD-10-CM

## 2017-07-13 DIAGNOSIS — C8319 Mantle cell lymphoma, extranodal and solid organ sites: Secondary | ICD-10-CM

## 2017-07-13 DIAGNOSIS — K264 Chronic or unspecified duodenal ulcer with hemorrhage: Secondary | ICD-10-CM

## 2017-07-13 DIAGNOSIS — R590 Localized enlarged lymph nodes: Secondary | ICD-10-CM

## 2017-07-13 DIAGNOSIS — D649 Anemia, unspecified: Secondary | ICD-10-CM

## 2017-07-13 DIAGNOSIS — Z79899 Other long term (current) drug therapy: Secondary | ICD-10-CM

## 2017-07-13 DIAGNOSIS — R55 Syncope and collapse: Secondary | ICD-10-CM

## 2017-07-13 MED ORDER — IBRUTINIB 420 MG PO TABS: 420.0000 mg | ORAL_TABLET | Freq: Every day | ORAL | 4 refills | Status: DC

## 2017-07-13 MED ORDER — IBRUTINIB 560 MG PO TABS: 560.0000 mg | ORAL_TABLET | Freq: Every morning | ORAL | 3 refills | Status: DC

## 2017-07-13 MED FILL — IMBRUVICA 560 MG TAB: 560 | 28 days supply | Qty: 28 | Fill #0

## 2017-07-13 NOTE — Telephone Encounter (Signed)
Gave patient avs and calendar of upcoming august appointments. °

## 2017-07-13 NOTE — Progress Notes (Signed)
[place]  Telephone:(336) [phone] Fax:(336) [phone]    ID: [name] DOB: 01-Apr-1950  MR#: [id number]  SEG#:[id number]  Patient Care Team: Shelda Pal, DO as PCP - General (Family Medicine) Verneice Caspers, Virgie Dad, MD as Consulting Physician (Oncology) Donnie Mesa, MD as Consulting Physician (General Surgery) Wilford Corner, MD as Consulting Physician (Gastroenterology) Ples Specter, MD as Referring Physician (Internal Medicine) Larina Earthly, MD as Referring Physician (Hematology and Oncology) Grace Isaac, MD as Consulting Physician (Cardiothoracic Surgery) PCP: Shelda Pal, DO OTHER MD: Napoleon Form MD  CHIEF COMPLAINT: 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 He continues on rituximab and ibrutinib. He tolerates this well. He denies any "B-symptoms" including unexplained weight loss, drenching sweats, fatigue, fevers, or lymphadenopathy.   Since his last visit, he completed a PET scan on 07/06/2017 showing: Mildly increased size of mediastinal lymphadenopathy, without significant change in overall degree of hypermetabolic activity. No significant change in hypermetabolic bilateral hilar lymphadenopathy. 1.2 cm right supraclavicular lymph node is stable in size, but shows increased hypermetabolic activity. No new sites of hypermetabolic lymphadenopathy identified.  He is here today to discuss those results   REVIEW OF SYSTEMS: [name] reports that he has occasional cramping in his fingers and legs. He feels that he need to drink more water. He eats fruit most days.  He denies any recent dental work or any dental problems.  He denies any change in bowel movements. He notes that he has spent some time outside.  He denies unusual headaches, visual  changes, nausea, vomiting, or dizziness. There has been no unusual cough, phlegm production, or pleurisy. This been no change in bowel or bladder habits. He denies unexplained fatigue or unexplained weight loss, bleeding, rash, or fever. A detailed review of systems was otherwise stable.    HISTORY OF PRESENT ILLNESS: From the original intake note:   "[name]" was in his usual good health until early December 2016, when he had a brief syncopal episode. He "blew this off" and it did not happen again until 01/17/2015 when he felt weak at work and later fainted at home. He was brought to the ED where he was found to be guaiac positive (denies melena before that date) with a Hb of 4.5 MCV 82.2, platelets 295K, WBC 9.1 and normal INR and PTT. He was admitted and underwent EGD under Dr Michail Sermon 01/18/2015 showing a very large duodenal ulcer w clot. Abdominal US 01/19/2015 was not very informative but CT abd/pelvis 12/17,2016 showed adenopathy in the anterior mediastinum, retroperitoneum and pelvis, with a large ulcerated mass involving the distal stomach/proximal duodenum and extending into the gastrohepatic region.   On 01/22/2015 he underwent right inguinal lymph node biopsy, consistent with mantle cell lymphoma (([id number] and [id number])). Specifically the lymph node showed effacement of the architecture by sheets of small to medium sized lymphocytes with no apparent nodularity. The cells were positive for CD20, CD5, BCL-2, CD21, and cyclin D1. He 67 was low (10-20%). CD21 revealed a few residual germinal centers area at the B cells were negative for CD10, and BCL-6. CD23 was negative.  On 02/06/2015 the patient underwent a bone marrow aspirate and biopsy. The pathology from this procedure (FZB 17-2 and 17-7) was positive for involvement by non-Hodgkin's lymphoma with numerous atypical interstitial and paratrabecular lymphoid aggregates consistent with mantle cell lymphoma. Iron  stain showed scant iron flow  cytometry confirmed a monoclonal B-cell population, lambda restricted, consistent with the patient's known diagnosis of mantle cell lymphoma.  On 01/18/2015 the patient underwent EGD which found a massive duodenal bulb ulcer with adherent clot, although no active bleeding.his hemoglobin was in the 8.4 range and stable. He was discharged on 01/22/2015, but within 24 hours was readmitted with syncope and altered mental status. He had aspirated and required intubation for his respiratory failure. Hemoglobin was down to 6.3. An NG tube was placed, the patient was multiply transfused, and Surgery was considered but felt not to be feasible.accordingly the patient underwent arteriography 01/23/2015 which found the superior mesenteric and celiac arteries not to be the involved vessels. The gastroduodenal artery showed multiple branches providing supply to the hypervascular duodenal mass, and this artery was coil embolized.. Also a port was placed at the time of this procedure with a view to eventual chemotherapy.  He was discharged 01/26/2015,but required readmission 02/07/2015 for another presyncopal episode. He presented to the emergency room where he was found to have a hemoglobin of 7.5. The patient was transfusedIIA hemoglobin of 10 and was discharged 02/07/2014. He was scheduled to start CHOP right toxin 02/08/2014.  His subsequent history is as detailed below. PAST MEDICAL HISTORY: Past Medical History:  Diagnosis Date  . Anemia   . Cancer ([place])    Lymphoma  . Essential hypertension   . GERD (gastroesophageal reflux disease)   . GI bleed   . HLD (hyperlipidemia)   . Weakness     PAST SURGICAL HISTORY: Past Surgical History:  Procedure Laterality Date  . ESOPHAGOGASTRODUODENOSCOPY Left 01/18/2015   Procedure: ESOPHAGOGASTRODUODENOSCOPY (EGD);  Surgeon: Wilford Corner, MD;  Location: [facility] ENDOSCOPY;  Service: Endoscopy;  Laterality: Left;  . INGUINAL HERNIA REPAIR Right 01/22/2015   Procedure:  RIGHT INGUINAL LYMPH NODE BX;  Surgeon: Donnie Mesa, MD;  Location: [facility];  Service: General;  Laterality: Right;  . MEDIASTINOSCOPY N/A 05/05/2017   Procedure: MEDIASTINOSCOPY;  Surgeon: Grace Isaac, MD;  Location: [place];  Service: Thoracic;  Laterality: N/A;  . PORTACATH PLACEMENT  01/26/2015    power port with tip SVC/RA [place]  . SKIN SURGERY     Small benign cysts over left scalp removed  . VIDEO BRONCHOSCOPY WITH ENDOBRONCHIAL ULTRASOUND N/A 05/05/2017   Procedure: VIDEO BRONCHOSCOPY WITH ENDOBRONCHIAL ULTRASOUND;  Surgeon: Grace Isaac, MD;  Location: [facility] OR;  Service: Thoracic;  Laterality: N/A;    FAMILY HISTORY Family History  Problem Relation Age of Onset  . Hypertension Mother   . Hypertension Father   . Kidney failure Father   . Hypertension Sister   . Diabetes Sister   . Prostate cancer Brother   . Lupus Sister    The patient's father died from CHF complications age 58. The patient's mother is 32 y/o as of December 2016. The patient has 9 brothers, 8 sisters. One brother has prostate cancer. There is no other cancer history in the family to his knowledge.  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 His wife of 23 years, [name], is disabled due to RA. Daughter, [name] works as a Teacher, early years/pre for [employer] (she previously worked at the [employer] in records). Son, [name] owns a window washing business. Son, [name] is in Press photographer at [employer]. All live in [place]. The patient has 3 grandchildren. He attends a [employer].    ADVANCED DIRECTIVES: Not in  place   HEALTH MAINTENANCE: Social History   Tobacco Use  . Smoking status: Never Smoker  . Smokeless tobacco: Never Used  Substance Use Topics  . Alcohol use: Yes    Comment: occasional beer  . Drug use: No     Colonoscopy:  PSA: 2.27 on 01/22/2015  Hepatitis serologies (B surface antigen, hepatitis B core antibody and hepatitis C IgM)  negative 01/18/2015  No Known Allergies  Current Outpatient Medications  Medication Sig Dispense Refill  . Ibrutinib 420 MG TABS Take 420 mg by mouth daily. Take with a full glass of water at approximately the same time each day 84 tablet 4  . Ibrutinib 560 MG TABS Take 560 mg by mouth every morning. 84 tablet 3  . lisinopril-hydrochlorothiazide (PRINZIDE,ZESTORETIC) 20-25 MG tablet TAKE 1 TABLET BY MOUTH EVERY DAY 90 tablet 1  . lovastatin (MEVACOR) 20 MG tablet TAKE 1 TABLET BY MOUTH EVERYDAY AT BEDTIME (Patient taking differently: Take 20 mg by mouth at bedtime. TAKE 1 TABLET BY MOUTH EVERYDAY AT BEDTIME) 90 tablet 1   No current facility-administered medications for this visit.     OBJECTIVE: Middle-aged African-American man who appears well  There were no vitals filed for this visit.   There is no height or weight on file to calculate BMI.    ECOG FS:0 - Asymptomatic  Sclerae unicteric, EOMs intact Oropharynx clear and moist No cervical or supraclavicular adenopathy--specifically no palpable right supraclavicular adenopathy lungs no rales or rhonchi Heart regular rate and rhythm Abd soft, nontender, positive bowel sounds MSK no focal spinal tenderness, no upper extremity lymphedema Neuro: nonfocal, well oriented, appropriate affect  LAB RESULTS:  CMP     Component Value Date/Time   NA 137 07/06/2017 0743   NA 140 01/16/2017 0837   K 4.1 07/06/2017 0743   K 3.9 01/16/2017 0837   CL 104 07/06/2017 0743   CO2 24 07/06/2017 0743   CO2 24 01/16/2017 0837   GLUCOSE 94 07/06/2017 0743   GLUCOSE 102 01/16/2017 0837   BUN 20 07/06/2017 0743   BUN 20.4 01/16/2017 0837   CREATININE 1.03 07/06/2017 0743   CREATININE 1.2 01/16/2017 0837   CALCIUM 9.0 07/06/2017 0743   CALCIUM 8.9 01/16/2017 0837   PROT 6.9 07/06/2017 0743   PROT 6.9 01/16/2017 0837   ALBUMIN 4.1 07/06/2017 0743   ALBUMIN 4.0 01/16/2017 0837   AST 18 07/06/2017 0743   AST 20 01/16/2017 0837   ALT 18  07/06/2017 0743   ALT 22 01/16/2017 0837   ALKPHOS 41 07/06/2017 0743   ALKPHOS 47 01/16/2017 0837   BILITOT 0.5 07/06/2017 0743   BILITOT 0.36 01/16/2017 0837   GFRNONAA >60 07/06/2017 0743   GFRAA >60 07/06/2017 0743    INo results found for: SPEP, UPEP  Lab Results  Component Value Date   WBC 3.8 (L) 07/06/2017   NEUTROABS 2.4 07/06/2017   HGB 12.5 (L) 07/06/2017   HCT 38.2 (L) 07/06/2017   MCV 84.2 07/06/2017   PLT 295 07/06/2017      Chemistry      Component Value Date/Time   NA 137 07/06/2017 0743   NA 140 01/16/2017 0837   K 4.1 07/06/2017 0743   K 3.9 01/16/2017 0837   CL 104 07/06/2017 0743   CO2 24 07/06/2017 0743   CO2 24 01/16/2017 0837   BUN 20 07/06/2017 0743   BUN 20.4 01/16/2017 0837   CREATININE 1.03 07/06/2017 0743   CREATININE 1.2 01/16/2017 6468  Component Value Date/Time   CALCIUM 9.0 07/06/2017 0743   CALCIUM 8.9 01/16/2017 0837   ALKPHOS 41 07/06/2017 0743   ALKPHOS 47 01/16/2017 0837   AST 18 07/06/2017 0743   AST 20 01/16/2017 0837   ALT 18 07/06/2017 0743   ALT 22 01/16/2017 0837   BILITOT 0.5 07/06/2017 0743   BILITOT 0.36 01/16/2017 0837       No results found for: LABCA2  No components found for: [id number]  No results for input(s): INR in the last 168 hours.  Urinalysis    Component Value Date/Time   COLORURINE YELLOW 01/23/2015 0805   APPEARANCEUR CLEAR 01/23/2015 0805   LABSPEC 1.014 01/23/2015 0805   PHURINE 7.5 01/23/2015 0805   GLUCOSEU NEGATIVE 01/23/2015 0805   HGBUR NEGATIVE 01/23/2015 0805   BILIRUBINUR NEGATIVE 01/23/2015 0805   KETONESUR NEGATIVE 01/23/2015 0805   PROTEINUR NEGATIVE 01/23/2015 0805   NITRITE NEGATIVE 01/23/2015 0805   LEUKOCYTESUR NEGATIVE 01/23/2015 0805   Most recent beta-2 microglobulin and LDH obtained 04/23/2017 remains normal  STUDIES: Nm Pet Image Initial (pi) Skull Base To Thigh  Result Date: 07/06/2017 CLINICAL DATA:  Subsequent treatment strategy for mantle cell  non-Hodgkin's lymphoma. Status post induction chemotherapy. EXAM: NUCLEAR MEDICINE PET SKULL BASE TO THIGH TECHNIQUE: 10.3 mCi F-18 FDG was injected intravenously. Full-ring PET imaging was performed from the skull base to thigh after the radiotracer. CT data was obtained and used for attenuation correction and anatomic localization. Fasting blood glucose: 94 mg/dl COMPARISON:  04/20/2017 FINDINGS: (Mediastinal blood pool activity: SUV max = 2.5) NECK:  No hypermetabolic lymph nodes or masses. Incidental CT findings:  None. CHEST: 12 mm right supraclavicular lymph node on image 37/4 shows no significant change in size, but shows markedly increased hypermetabolic activity, with SUV max of 12.0 compared to 1.2 previously. Mild increase in size of mediastinal lymphadenopathy is seen, with index lymph node in the precarinal region measuring 2.4 cm on image 60/4 compared to 2.1 cm previously, and index subcarinal lymph node measuring 2.0 cm on image 70/4 compared to 1.5 cm previously. These mediastinal nodes show no significant change in degree of hypermetabolic activity. Index precarinal lymph node has SUV max of 16.0 compared to 15.4 previously. Index subcarinal lymph node has SUV max of 15.8 compared to 18.9 previously. Hypermetabolic bilateral hilar lymphadenopathy also shows no significant change, with SUV max in the right hilum measuring 14.2 compared to 15.4 previously. No suspicious pulmonary nodules identified on CT images. Incidental CT findings:  Aortic and coronary artery atherosclerosis. ABDOMEN/PELVIS: No abnormal hypermetabolic activity within the liver, pancreas, adrenal glands, or spleen. No hypermetabolic lymph nodes in the abdomen or pelvis. Incidental CT findings:  None. SKELETON: No focal hypermetabolic bone lesions to suggest skeletal metastasis. Incidental CT findings:  None. IMPRESSION: Mildly increased size of mediastinal lymphadenopathy, without significant change in overall degree of  hypermetabolic activity. No significant change in hypermetabolic bilateral hilar lymphadenopathy. 1.2 cm right supraclavicular lymph node is stable in size, but shows increased hypermetabolic activity. No new sites of hypermetabolic lymphadenopathy identified. Electronically Signed   By: Earle Gell M.D.   On: 07/06/2017 11:56   Pathology report discussed with the patient.  ASSESSMENT: 67 y.o. [place] man with a diagnosis of mantle cell non-Hodgkin's lymphoma of extranodal and solid organ sites presenting with syncope secondary to bleeding from a large ulcerated duodenal ulcer  (a) s/p coil embolization of the feeding (gastro-duodenal) artery 01/23/2015  (b) anemia--scant iron stores on bone marrow biopsy--s/p feraheme 02/13/2015  (1) right  inguinal lymph node biopsy 01/22/2015 confirms mantle cell lymphoma  (a) bone marrow biopsy 02/06/2015 positive for involvement by the patient's mantle cell lymphoma  (b) IPI score of 5 (high risk) predicts a 5 year progression free survival of 50% with CHOP-Rituxan chemotherapy  (c) MIPI score of 5 (intermediate risk) predicts a median survival of 58 months  (2) CHOP/Rituxan started 02/09/2015, completed 8 cycles 07/05/2015  (3)  [facility] consultation 04/06/2015. Patient opted against transplant consolidation  (4) rituximab maintenance started 08/16/2015, repeat every 2 months, discontinued February 2019  (a) PET scan had shown a suggestion of recurrence, but biopsy was negative  (5) PSA increase noted July 2017, back to baseline by December 2017  (6) restaging PET scan 11/17/2016 is consistent with disease progression  (a) repeat PET scan 02/02/2017 confirms increase in lymph nodes  (b) PET scan 04/20/2017 shows increasing hypermetabolic adenopathy  (c) bronchoscopy/mediastinoscopy 05/05/2017 shows granulomatous disease, with negative AFB/ GMS stains; <10% of lymphocytes are B-cell with slight lambda excess  (d) PET scan on 07/06/2017 shows mild increase  in mediastinal lymphadenopathy. Increased hypermetabolic supraclavicular lymph node.   (7) ibrutinib 420 mg daily started 02/09/2017, discontinued March 2019, resumed April 2019  (8) granulomatous inflammation noted on pathology from 05/05/2017 mediastinoscopy  (a) fungal bacterial and acid-fast studies negative to date  (b) consider sarcoid  PLAN: [name] is now 2-1/2 years out from initial diagnosis of mantle cell lymphoma.  He is tolerating the ibrutinib well.  The problem is that the recent PET scan shows some increase in size although not in SUV in the mediastinal nodes, and one hot right supraclavicular lymph node which measures 1.2 cm.  I do not think we are going to be able to surgically approach the right supraclavicular lymph node without great difficulty.  We discussed several possibilities including simply repeating a PET in 6 months, increasing the ibrutinib dose to 560, increasing the ibrutinib dose and also adding right toxin, or going to some kind of chemo immunotherapy.  After much discussion we decided the simplest thing to do would be to increase the ibrutinib.  He is going to start 560 mg daily and I wrote the prescription so he can start immediately.  He [name] have lab work and a repeat PET scan in the last week in July and see me the first Monday in August.  Depending on those results we [name] again consider these options.  I do not think the cramps he is having are going to be related to the ibrutinib.  I recommended that he drink more water and I am getting a magnesium level with his next set of labs  He knows to call for any other issues that may develop before the next visit.    [name], Virgie Dad, MD  07/13/17 11:29 AM Medical Oncology and Hematology [facility] [address] [place] [address] Tel. [phone]    Fax. [phone]  Alice Rieger, am acting as scribe for Chauncey Cruel MD.  I, Lurline Del MD, have reviewed  the above documentation for accuracy and completeness, and I agree with the above.

## 2017-07-14 ENCOUNTER — Other Ambulatory Visit: Payer: Self-pay | Admitting: Oncology

## 2017-08-10 MED FILL — IMBRUVICA 560 MG TAB: 560 | 28 days supply | Qty: 28 | Fill #1

## 2017-08-31 ENCOUNTER — Inpatient Hospital Stay: Payer: BLUE CROSS/BLUE SHIELD | Attending: Hematology and Oncology

## 2017-08-31 ENCOUNTER — Ambulatory Visit (HOSPITAL_COMMUNITY)
Admission: RE | Admit: 2017-08-31 | Discharge: 2017-08-31 | Disposition: A | Discharge status: Home / Self Care | Payer: BLUE CROSS/BLUE SHIELD | Source: Ambulatory Visit | Attending: Oncology | Admitting: Oncology

## 2017-08-31 DIAGNOSIS — E785 Hyperlipidemia, unspecified: Secondary | ICD-10-CM | POA: Insufficient documentation

## 2017-08-31 DIAGNOSIS — Z79899 Other long term (current) drug therapy: Secondary | ICD-10-CM | POA: Insufficient documentation

## 2017-08-31 DIAGNOSIS — K219 Gastro-esophageal reflux disease without esophagitis: Secondary | ICD-10-CM | POA: Diagnosis not present

## 2017-08-31 DIAGNOSIS — R55 Syncope and collapse: Secondary | ICD-10-CM | POA: Insufficient documentation

## 2017-08-31 DIAGNOSIS — R590 Localized enlarged lymph nodes: Secondary | ICD-10-CM | POA: Insufficient documentation

## 2017-08-31 DIAGNOSIS — D649 Anemia, unspecified: Secondary | ICD-10-CM | POA: Diagnosis not present

## 2017-08-31 DIAGNOSIS — Z9221 Personal history of antineoplastic chemotherapy: Secondary | ICD-10-CM | POA: Diagnosis not present

## 2017-08-31 DIAGNOSIS — I10 Essential (primary) hypertension: Secondary | ICD-10-CM | POA: Insufficient documentation

## 2017-08-31 DIAGNOSIS — C8319 Mantle cell lymphoma, extranodal and solid organ sites: Secondary | ICD-10-CM | POA: Insufficient documentation

## 2017-08-31 DIAGNOSIS — K264 Chronic or unspecified duodenal ulcer with hemorrhage: Secondary | ICD-10-CM | POA: Diagnosis not present

## 2017-08-31 LAB — COMPREHENSIVE METABOLIC PANEL
ALBUMIN: 4 g/dL (ref 3.5–5.0)
ALK PHOS: 33 U/L — AB (ref 38–126)
ALT: 17 U/L (ref 0–44)
ANION GAP: 8 (ref 5–15)
AST: 16 U/L (ref 15–41)
BILIRUBIN TOTAL: 0.5 mg/dL (ref 0.3–1.2)
BUN: 18 mg/dL (ref 8–23)
CALCIUM: 9.2 mg/dL (ref 8.9–10.3)
CO2: 28 mmol/L (ref 22–32)
CREATININE: 1.09 mg/dL (ref 0.61–1.24)
Chloride: 102 mmol/L (ref 98–111)
GFR calc Af Amer: >60 mL/min (ref >60)
GFR calc non Af Amer: >60 mL/min (ref >60)
GLUCOSE: 94 mg/dL (ref 70–99)
Potassium: 4.3 mmol/L (ref 3.5–5.1)
SODIUM: 138 mmol/L (ref 135–145)
TOTAL PROTEIN: 6.6 g/dL (ref 6.5–8.1)

## 2017-08-31 LAB — GLUCOSE, CAPILLARY: Glucose-Capillary: 102 mg/dL — ABNORMAL HIGH (ref 70–99)

## 2017-08-31 LAB — LACTATE DEHYDROGENASE: LDH: 247 U/L — ABNORMAL HIGH (ref 98–192)

## 2017-08-31 LAB — CBC WITH DIFFERENTIAL/PLATELET
BASOS ABS: 0 10*3/uL (ref 0.0–0.1)
BASOS PCT: 1 %
EOS ABS: 0 10*3/uL (ref 0.0–0.5)
Eosinophils Relative: 1 %
HEMATOCRIT: 38.3 % — AB (ref 38.4–49.9)
Hemoglobin: 12.3 g/dL — ABNORMAL LOW (ref 13.0–17.1)
Lymphocytes Relative: 20 %
Lymphs Abs: 0.7 10*3/uL — ABNORMAL LOW (ref 0.9–3.3)
MCH: 27.4 pg (ref 27.2–33.4)
MCHC: 32.1 g/dL (ref 32.0–36.0)
MCV: 85.3 fL (ref 79.3–98.0)
MONO ABS: 0.8 10*3/uL (ref 0.1–0.9)
MONOS PCT: 22 %
Neutro Abs: 2 10*3/uL (ref 1.5–6.5)
Neutrophils Relative %: 56 %
Platelets: 266 10*3/uL (ref 140–400)
RBC: 4.49 MIL/uL (ref 4.20–5.82)
RDW: 14.4 % (ref 11.0–14.6)
WBC: 3.5 10*3/uL — ABNORMAL LOW (ref 4.0–10.3)

## 2017-08-31 LAB — MAGNESIUM: Magnesium: 1.8 mg/dL (ref 1.7–2.4)

## 2017-08-31 IMAGING — CT NM PET TUM IMG RESTAG (PS) SKULL BASE T - THIGH
8 series · 25 of 25 positions shown · non-contrast
Comparison: [DATE]

CLINICAL DATA: Subsequent treatment strategy for lymphoma.

EXAM:
NUCLEAR MEDICINE PET SKULL BASE TO THIGH
TECHNIQUE: 10.4 mCi F-18 FDG was injected intravenously. Full-ring PET imaging
was performed from the skull base to thigh after the radiotracer. CT
data was obtained and used for attenuation correction and anatomic
localization.
Fasting blood glucose: 102 mg/dl

[Series 4: pet sk_thigh ac · axial · 2.0mm · 4.07mm/px · z∈[-1304,-397]mm · 9 of 449 slices shown]
[im 1/449]
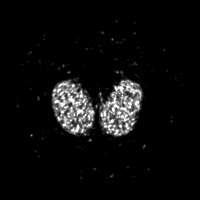
[im 57/449]
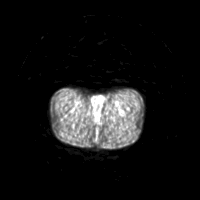
[im 113/449]
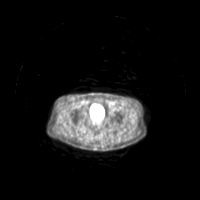
[im 169/449]
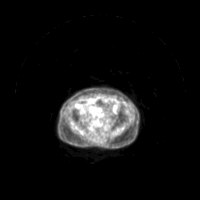
[im 225/449]
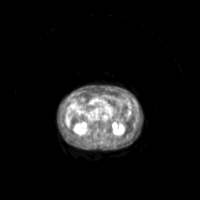
[im 281/449]
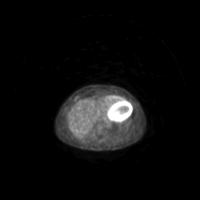
[im 337/449]
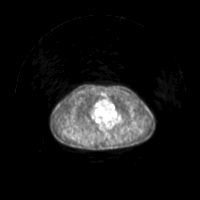
[im 393/449]
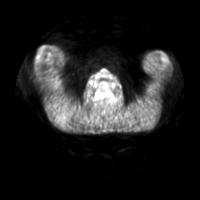
[im 449/449]
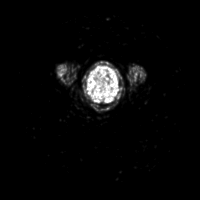

[Series 5: ct sk_thigh 5.0 b31f · axial · 5.0mm · 0.98mm/px · z∈[-1304,-396]mm · 4 of 228 slices shown]
[im 1/228  brain]
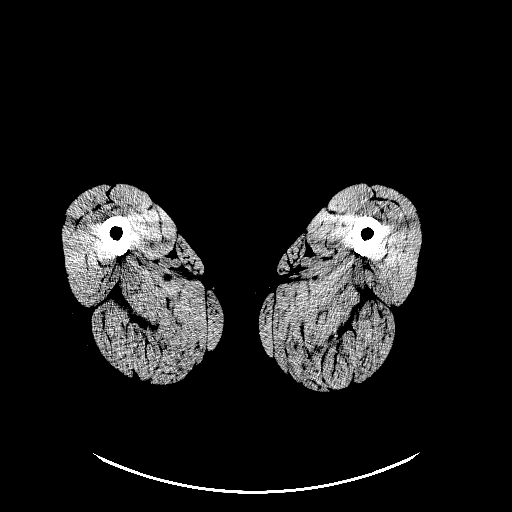
[im 76/228  brain]
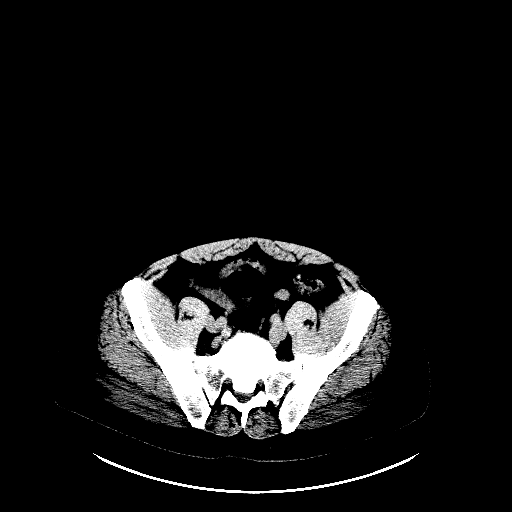
[im 152/228]
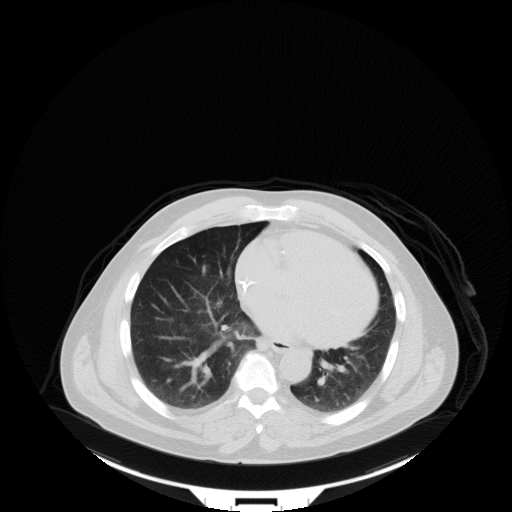
[im 228/228  brain]
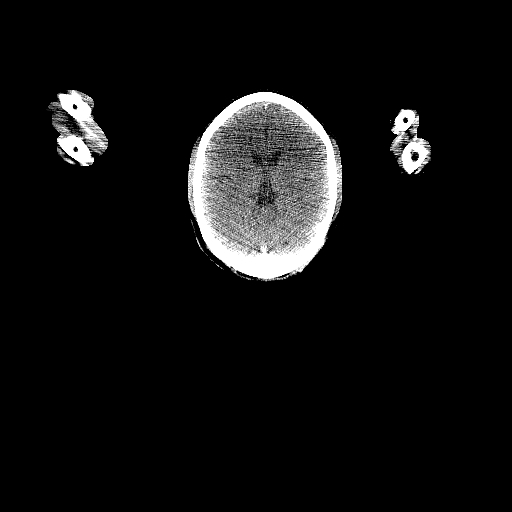

[Series 6: pet sk_thigh nac · axial · 5.0mm · 4.07mm/px · z∈[-1304,-396]mm · 4 of 228 slices shown]
[im 1/228]
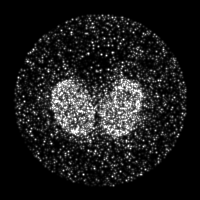
[im 76/228]
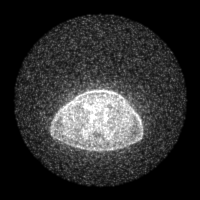
[im 152/228]
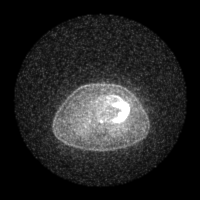
[im 228/228]
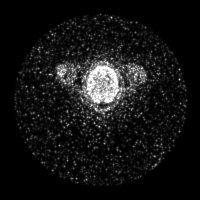

[Series 8: ct sk_thigh 5.0 b70f (id)_bone · axial · 5.0mm · 0.75mm/px · 1 of 77 slices shown]
[im 1/77  bone]
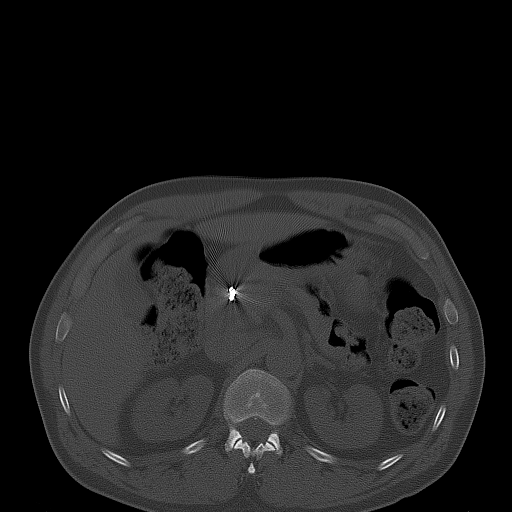

[Series 603: range-ct sk_thigh 5.0 (id)<alpha range> · 1 of 67 slices shown (1 of 2)]
[im 1/67]
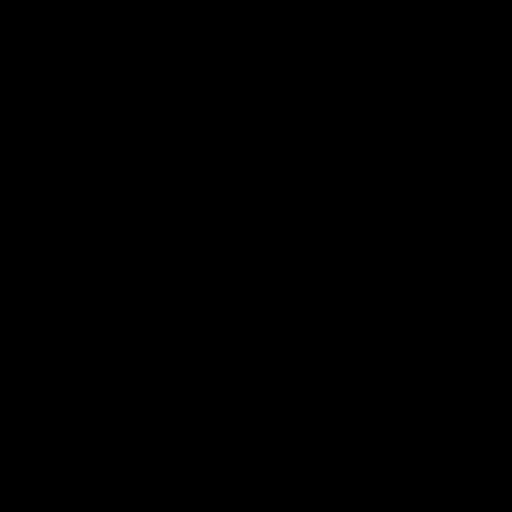

[Series 604: mip range 3 · coronal · 1.88mm/px · 1 of 32 slices shown]
[im 1/32]
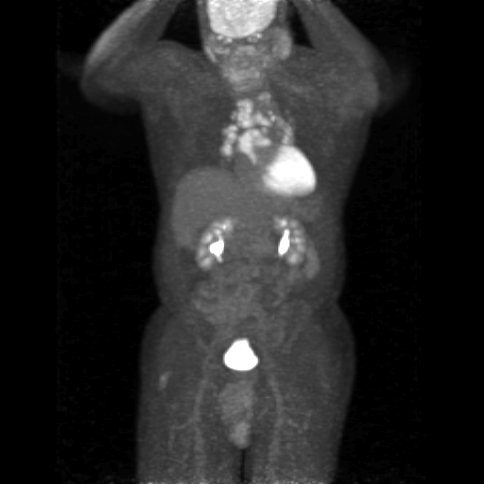

[Series 605: range-ct sk_thigh 5.0 (id)<alpha range> · 4 of 222 slices shown (2 of 2)]
[im 1/222]
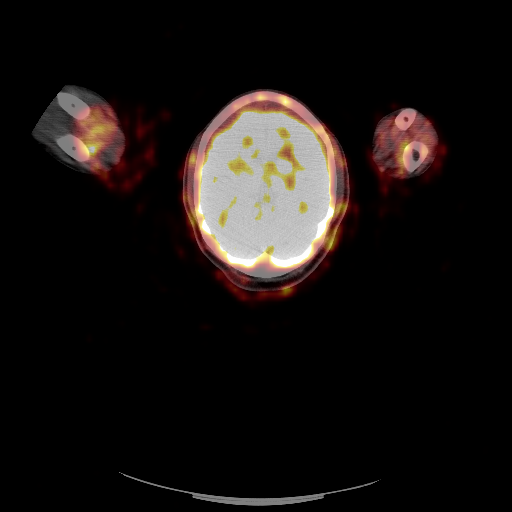
[im 74/222]
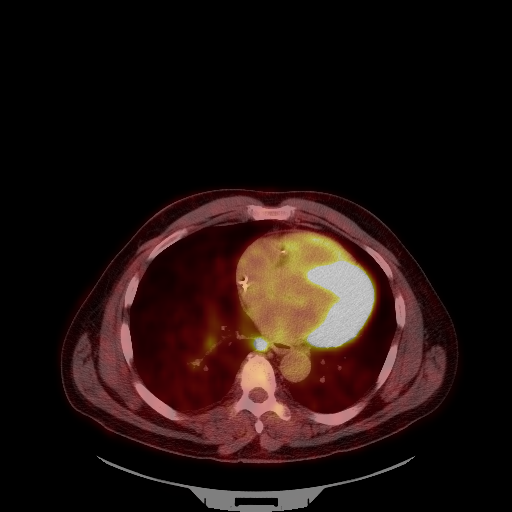
[im 148/222]
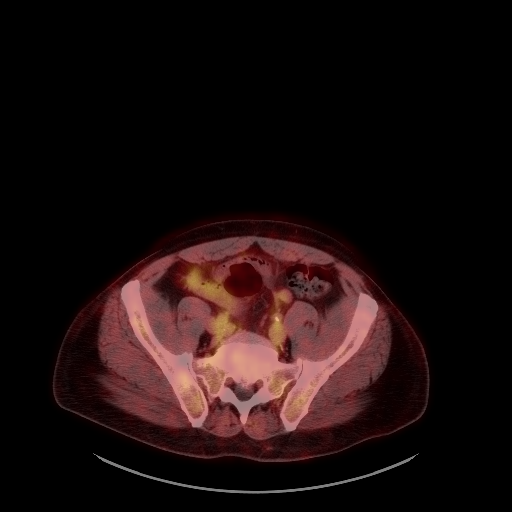
[im 222/222]
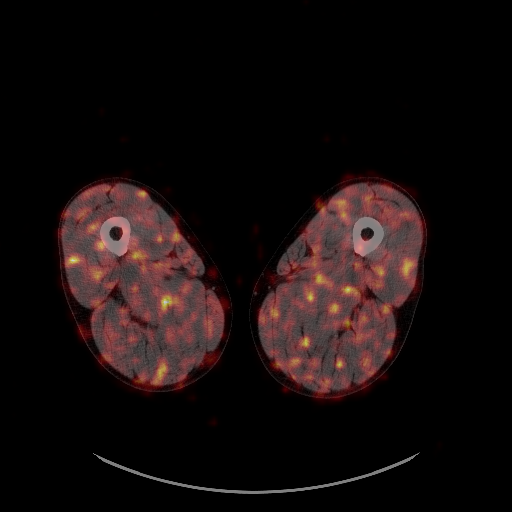

[Series 1990: results mm oncology reading · 2.0mm · 1.06mm/px · 1 of 5 slices shown]
[im 1/5]
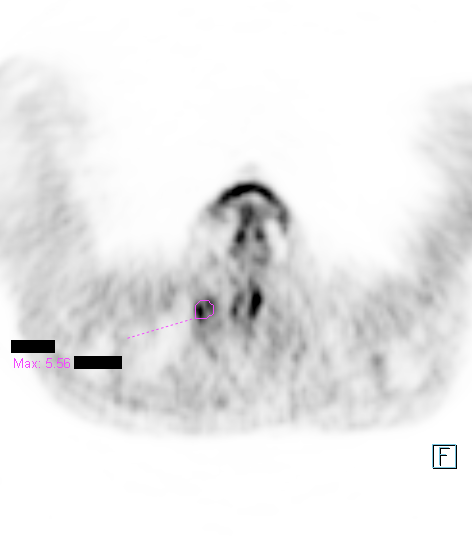

[25 of 25 positions shown; findings below may reference images not displayed]

FINDINGS: Mediastinal blood pool activity: SUV max

NECK: No hypermetabolic lymph nodes in the neck.

Incidental CT findings: none

CHEST: 12 mm right supraclavicular node measured previously is 8 mm
today. Hypermetabolism decreased with SUV max = 5.6 compared to 12.

2.4 cm short axis precarinal index node measured previously is
stable ([DATE]). SUV max = 14 today compared to 16 previously.

Index subcarinal lymph node measured previously at 2 cm short axis
is stable at 2 cm today ([DATE]). SUV max = 14.1 today compared to
15.8 previously.

Similar appearance hypermetabolic lymph nodes in both hilar regions.
Right hilar disease demonstrates SUV max = 11.2 today compared to
14.2 previously.

No suspicious pulmonary nodule or mass. No focal lung consolidation.

Incidental CT findings: Right Port-A-Cath tip is positioned in the
right atrium. Coronary artery calcification is evident.
Atherosclerotic calcification is noted in the wall of the thoracic
aorta.

ABDOMEN/PELVIS: No abnormal hypermetabolic activity within the
liver, pancreas, adrenal glands, or spleen. No hypermetabolic lymph
nodes in the abdomen or pelvis.

Incidental CT findings: none

SKELETON: No focal hypermetabolic activity to suggest skeletal
metastasis.

Incidental CT findings: none
IMPRESSION: 1. No substantial change in CT appearance of thoracic
lymphadenopathy. Hypermetabolism within the lymphadenopathy is
minimally decreased since prior study.
2. No new or progressive hypermetabolic disease on the current exam.

## 2017-08-31 MED ORDER — FLUDEOXYGLUCOSE F - 18 (FDG) INJECTION
10.3700 | Freq: Once | INTRAVENOUS | Status: AC | PRN
  Administered 2017-08-31: 10.37 via INTRAVENOUS

## 2017-09-01 LAB — BETA 2 MICROGLOBULIN, SERUM: BETA 2 MICROGLOBULIN: 1.7 mg/L (ref 0.6–2.4)

## 2017-09-03 ENCOUNTER — Telehealth: Payer: Self-pay | Admitting: Medical Oncology

## 2017-09-03 MED FILL — IMBRUVICA 560 MG TAB: 560 | 28 days supply | Qty: 28 | Fill #2

## 2017-09-03 NOTE — Telephone Encounter (Signed)
Wife and pt asking for PET scan results.  Pt keeps looking in [facility] for results. . I told wife that [facility] is out of town and may not have seen results yet and if the results were concerning the on call doctor would have called pt.

## 2017-09-06 NOTE — Progress Notes (Signed)
[place]  Telephone:([phone]) [phone] Fax:(336) [phone]    ID: [id number] ULLOM DOB: 1950-06-21  MR#: [id number]  LKT#:[id number]  Patient Care Team: Shelda Pal, DO as PCP - General (Family Medicine) Magrinat, Virgie Dad, MD as Consulting Physician (Oncology) Donnie Mesa, MD as Consulting Physician (General Surgery) Wilford Corner, MD as Consulting Physician (Gastroenterology) Ples Specter, MD as Referring Physician (Internal Medicine) Larina Earthly, MD as Referring Physician (Hematology and Oncology) Grace Isaac, MD as Consulting Physician (Cardiothoracic Surgery) PCP: Shelda Pal, DO OTHER MD: Napoleon Form MD; Dr Glennon Mac [phone]  CHIEF COMPLAINT: 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 He continues on ibrutinib, currently at 560 mg daily, which he tolerates well.  His wife notes that he has had a couple of headaches on this medication with his last headache being >1 week ago. He notes that during the day, he [name] have occasional cramping in his fingers.  Sometimes after mowing the lawn he [name] have some leg cramps as well, once he gets in bed.. He notes that he doesn't consume as much water as he should, and on a daily basis, he would drink up to 2 bottles of water.   Since his last visit, he completed a restaging PET scan on 08/31/2017 showing: No substantial change in CT appearance of thoracic lymphadenopathy. Hypermetabolism within the lymphadenopathy is minimally decreased since prior study. No new or progressive hypermetabolic disease on the current exam.  He is here today to discuss those results.     REVIEW OF SYSTEMS: Endi reports that for exercise, he is working out at [employer] and [employer]. He denies diarrhea.  Has been no  rash.  He notes that he has an area of concern to his right heel, however, he thinks that he may have scratched the area with his toenail. He denies unusual headaches, visual changes, nausea, vomiting, or dizziness. There has been no unusual cough, phlegm production, or pleurisy. This been no change in bowel or bladder habits. He denies unexplained fatigue or unexplained weight loss, bleeding, rash, or fever.  He has started back to jogging and doing weightlifting.  He continues to work full-time.  A detailed review of systems was otherwise stable.     HISTORY OF PRESENT ILLNESS: From the original intake note:   "[name]" was in his usual good health until early December 2016, when he had a brief syncopal episode. He "blew this off" and it did not happen again until 01/17/2015 when he felt weak at work and later fainted at home. He was brought to the ED where he was found to be guaiac positive (denies melena before that date) with a Hb of 4.5 MCV 82.2, platelets 295K, WBC 9.1 and normal INR and PTT. He was admitted and underwent EGD under Dr Michail Sermon 01/18/2015 showing a very large duodenal ulcer w clot. Abdominal US 01/19/2015 was not very informative but CT abd/pelvis 12/17,2016 showed adenopathy in the anterior mediastinum, retroperitoneum and pelvis, with a large ulcerated mass involving the distal stomach/proximal duodenum and extending into the gastrohepatic region.   On 01/22/2015 he underwent right inguinal lymph node biopsy, consistent with mantle cell lymphoma (([id number] and FZB [id number])). Specifically the lymph node showed effacement of the architecture by sheets of small to medium sized lymphocytes with no apparent nodularity. The cells were positive for  CD20, CD5, BCL-2, CD21, and cyclin D1. He 65 was low (10-20%). CD21 revealed a few residual germinal centers area at the B cells were negative for CD10, and BCL-6. CD23 was negative.  On 02/06/2015 the patient underwent a bone marrow aspirate and  biopsy. The pathology from this procedure (FZB 17-2 and 17-7) was positive for involvement by non-Hodgkin's lymphoma with numerous atypical interstitial and paratrabecular lymphoid aggregates consistent with mantle cell lymphoma. Iron stain showed scant iron flow cytometry confirmed a monoclonal B-cell population, lambda restricted, consistent with the patient's known diagnosis of mantle cell lymphoma.  On 01/18/2015 the patient underwent EGD which found a massive duodenal bulb ulcer with adherent clot, although no active bleeding.his hemoglobin was in the 8.4 range and stable. He was discharged on 01/22/2015, but within 24 hours was readmitted with syncope and altered mental status. He had aspirated and required intubation for his respiratory failure. Hemoglobin was down to 6.3. An NG tube was placed, the patient was multiply transfused, and Surgery was considered but felt not to be feasible.accordingly the patient underwent arteriography 01/23/2015 which found the superior mesenteric and celiac arteries not to be the involved vessels. The gastroduodenal artery showed multiple branches providing supply to the hypervascular duodenal mass, and this artery was coil embolized.. Also a port was placed at the time of this procedure with a view to eventual chemotherapy.  He was discharged 01/26/2015,but required readmission 02/07/2015 for another presyncopal episode. He presented to the emergency room where he was found to have a hemoglobin of 7.5. The patient was transfusedIIA hemoglobin of 10 and was discharged 02/07/2014. He was scheduled to start CHOP right toxin 02/09/2063.  His subsequent history is as detailed below. PAST MEDICAL HISTORY: Past Medical History:  Diagnosis Date  . Anemia   . Cancer ([place])    Lymphoma  . Essential hypertension   . GERD (gastroesophageal reflux disease)   . GI bleed   . HLD (hyperlipidemia)   . Weakness     PAST SURGICAL HISTORY: Past Surgical History:  Procedure  Laterality Date  . ESOPHAGOGASTRODUODENOSCOPY Left 01/18/2015   Procedure: ESOPHAGOGASTRODUODENOSCOPY (EGD);  Surgeon: Wilford Corner, MD;  Location: [facility] ENDOSCOPY;  Service: Endoscopy;  Laterality: Left;  . INGUINAL HERNIA REPAIR Right 01/22/2015   Procedure: RIGHT INGUINAL LYMPH NODE BX;  Surgeon: Donnie Mesa, MD;  Location: [facility];  Service: General;  Laterality: Right;  . MEDIASTINOSCOPY N/A 05/05/2017   Procedure: MEDIASTINOSCOPY;  Surgeon: Grace Isaac, MD;  Location: [facility];  Service: Thoracic;  Laterality: N/A;  . PORTACATH PLACEMENT  01/26/2015    power port with tip SVC/RA [place]  . SKIN SURGERY     Small benign cysts over left scalp removed  . VIDEO BRONCHOSCOPY WITH ENDOBRONCHIAL ULTRASOUND N/A 05/05/2017   Procedure: VIDEO BRONCHOSCOPY WITH ENDOBRONCHIAL ULTRASOUND;  Surgeon: Grace Isaac, MD;  Location: [facility] OR;  Service: Thoracic;  Laterality: N/A;    FAMILY HISTORY Family History  Problem Relation Age of Onset  . Hypertension Mother   . Hypertension Father   . Kidney failure Father   . Hypertension Sister   . Diabetes Sister   . Prostate cancer Brother   . Lupus Sister    The patient's father died from CHF complications age 65. The patient's mother is 78 y/o as of December 2016. The patient has 9 brothers, 8 sisters. One brother has prostate cancer. There is no other cancer history in the family to his knowledge.  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 His wife of 69 years, [name], is disabled due to RA. Daughter, [name] works as a Teacher, early years/pre for [employer] (she previously worked at the [employer] in records). Son, [name] owns a window washing business. Son, [name] is in Press photographer at [employer]. All live in [place]. The patient has 3 grandchildren. He attends a [employer].    ADVANCED DIRECTIVES: Not in place   HEALTH MAINTENANCE: Social History   Tobacco Use  . Smoking status: Never  Smoker  . Smokeless tobacco: Never Used  Substance Use Topics  . Alcohol use: Yes    Comment: occasional beer  . Drug use: No     Colonoscopy:  PSA: 2.27 on 01/22/2015  Hepatitis serologies (B surface antigen, hepatitis B core antibody and hepatitis C IgM) negative 01/18/2015  No Known Allergies  Current Outpatient Medications  Medication Sig Dispense Refill  . Ibrutinib 560 MG TABS Take 560 mg by mouth every morning. 84 tablet 3  . lisinopril-hydrochlorothiazide (PRINZIDE,ZESTORETIC) 20-25 MG tablet TAKE 1 TABLET BY MOUTH EVERY DAY 90 tablet 1  . lovastatin (MEVACOR) 20 MG tablet TAKE 1 TABLET BY MOUTH EVERYDAY AT BEDTIME (Patient taking differently: Take 20 mg by mouth at bedtime. TAKE 1 TABLET BY MOUTH EVERYDAY AT BEDTIME) 90 tablet 1   No current facility-administered medications for this visit.     OBJECTIVE: Middle-aged African-American man in no acute distress  Vitals:   09/07/17 0800  BP: 121/72  Pulse: 67  Resp: 18  Temp: 98.4 F (36.9 C)  SpO2: 100%     Body mass index is 28.94 kg/m.    ECOG FS:0 - Asymptomatic  Sclerae unicteric, pupils round and equal Oropharynx clear and moist No cervical or supraclavicular adenopathy, no axillary or inguinal adenopathy Lungs no rales or rhonchi Heart regular rate and rhythm Abd soft, nontender, positive bowel sounds MSK no focal spinal tenderness, no upper extremity lymphedema Neuro: nonfocal, well oriented, appropriate affect  LAB RESULTS:  CMP     Component Value Date/Time   NA 138 08/31/2017 0808   NA 140 01/16/2017 0837   K 4.3 08/31/2017 0808   K 3.9 01/16/2017 0837   CL 102 08/31/2017 0808   CO2 28 08/31/2017 0808   CO2 24 01/16/2017 0837   GLUCOSE 94 08/31/2017 0808   GLUCOSE 102 01/16/2017 0837   BUN 18 08/31/2017 0808   BUN 20.4 01/16/2017 0837   CREATININE 1.09 08/31/2017 0808   CREATININE 1.2 01/16/2017 0837   CALCIUM 9.2 08/31/2017 0808   CALCIUM 8.9 01/16/2017 0837   PROT 6.6 08/31/2017 0808     PROT 6.9 01/16/2017 0837   ALBUMIN 4.0 08/31/2017 0808   ALBUMIN 4.0 01/16/2017 0837   AST 16 08/31/2017 0808   AST 20 01/16/2017 0837   ALT 17 08/31/2017 0808   ALT 22 01/16/2017 0837   ALKPHOS 33 (L) 08/31/2017 0808   ALKPHOS 47 01/16/2017 0837   BILITOT 0.5 08/31/2017 0808   BILITOT 0.36 01/16/2017 0837   GFRNONAA >60 08/31/2017 0808   GFRAA >60 08/31/2017 0808    INo results found for: SPEP, UPEP  Lab Results  Component Value Date   WBC 3.5 (L) 08/31/2017   NEUTROABS 2.0 08/31/2017   HGB 12.3 (L) 08/31/2017   HCT 38.3 (L) 08/31/2017   MCV 85.3 08/31/2017   PLT 266 08/31/2017      Chemistry      Component Value Date/Time   NA 138 08/31/2017 0347  NA 140 01/16/2017 0837   K 4.3 08/31/2017 0808   K 3.9 01/16/2017 0837   CL 102 08/31/2017 0808   CO2 28 08/31/2017 0808   CO2 24 01/16/2017 0837   BUN 18 08/31/2017 0808   BUN 20.4 01/16/2017 0837   CREATININE 1.09 08/31/2017 0808   CREATININE 1.2 01/16/2017 0837      Component Value Date/Time   CALCIUM 9.2 08/31/2017 0808   CALCIUM 8.9 01/16/2017 0837   ALKPHOS 33 (L) 08/31/2017 0808   ALKPHOS 47 01/16/2017 0837   AST 16 08/31/2017 0808   AST 20 01/16/2017 0837   ALT 17 08/31/2017 0808   ALT 22 01/16/2017 0837   BILITOT 0.5 08/31/2017 0808   BILITOT 0.36 01/16/2017 0837       No results found for: LABCA2  No components found for: [id number]  No results for input(s): INR in the last 168 hours.  Urinalysis    Component Value Date/Time   COLORURINE YELLOW 01/23/2015 0805   APPEARANCEUR CLEAR 01/23/2015 0805   LABSPEC 1.014 01/23/2015 0805   PHURINE 7.5 01/23/2015 0805   GLUCOSEU NEGATIVE 01/23/2015 0805   HGBUR NEGATIVE 01/23/2015 0805   BILIRUBINUR NEGATIVE 01/23/2015 0805   KETONESUR NEGATIVE 01/23/2015 0805   PROTEINUR NEGATIVE 01/23/2015 0805   NITRITE NEGATIVE 01/23/2015 0805   LEUKOCYTESUR NEGATIVE 01/23/2015 0805   Most recent beta-2 microglobulin and LDH obtained 04/23/2017 remains  normal  STUDIES: Nm Pet Image Restag (ps) Skull Base To Thigh  Result Date: 08/31/2017 CLINICAL DATA:  Subsequent treatment strategy for lymphoma. EXAM: NUCLEAR MEDICINE PET SKULL BASE TO THIGH TECHNIQUE: 10.4 mCi F-18 FDG was injected intravenously. Full-ring PET imaging was performed from the skull base to thigh after the radiotracer. CT data was obtained and used for attenuation correction and anatomic localization. Fasting blood glucose: 102 mg/dl COMPARISON:  07/06/2017 FINDINGS: Mediastinal blood pool activity: SUV max 2.8 NECK: No hypermetabolic lymph nodes in the neck. Incidental CT findings: none CHEST: 12 mm right supraclavicular node measured previously is 8 mm today. Hypermetabolism decreased with SUV max = 5.6 compared to 12. 2.4 cm short axis precarinal index node measured previously is stable (5:58). SUV max = 14 today compared to 16 previously. Index subcarinal lymph node measured previously at 2 cm short axis is stable at 2 cm today (5:67). SUV max = 14.1 today compared to 15.8 previously. Similar appearance hypermetabolic lymph nodes in both hilar regions. Right hilar disease demonstrates SUV max = 11.2 today compared to 14.2 previously. No suspicious pulmonary nodule or mass. No focal lung consolidation. Incidental CT findings: Right Port-A-Cath tip is positioned in the right atrium. Coronary artery calcification is evident. Atherosclerotic calcification is noted in the wall of the thoracic aorta. ABDOMEN/PELVIS: No abnormal hypermetabolic activity within the liver, pancreas, adrenal glands, or spleen. No hypermetabolic lymph nodes in the abdomen or pelvis. Incidental CT findings: none SKELETON: No focal hypermetabolic activity to suggest skeletal metastasis. Incidental CT findings: none IMPRESSION: 1. No substantial change in CT appearance of thoracic lymphadenopathy. Hypermetabolism within the lymphadenopathy is minimally decreased since prior study. 2. No new or progressive hypermetabolic  disease on the current exam. Electronically Signed   By: Misty Stanley M.D.   On: 08/31/2017 11:08   Pathology report discussed with the patient.  ASSESSMENT: 67 y.o. [place] man with a diagnosis of mantle cell non-Hodgkin's lymphoma of extranodal and solid organ sites presenting with syncope secondary to bleeding from a large ulcerated duodenal ulcer  (a) s/p coil embolization of the feeding (gastro-duodenal)  artery 01/23/2015  (b) anemia--scant iron stores on bone marrow biopsy--s/p feraheme 02/13/2015  (1) right inguinal lymph node biopsy 01/22/2015 confirms mantle cell lymphoma  (a) bone marrow biopsy 02/06/2015 positive for involvement by the patient's mantle cell lymphoma  (b) IPI score of 5 (high risk) predicts a 5 year progression free survival of 50% with CHOP-Rituxan chemotherapy  (c) MIPI score of 5 (intermediate risk) predicts a median survival of 58 months  (2) CHOP/Rituxan started 02/09/2015, completed 8 cycles 07/05/2015  (3)  [facility] consultation 04/06/2015. Patient opted against transplant consolidation  (4) rituximab maintenance started 08/16/2015, repeat every 2 months, discontinued February 2019  (a) PET scan had shown a suggestion of recurrence, but biopsy was negative  (5) PSA increase noted July 2017, back to baseline by December 2017  (6) restaging PET scan 11/17/2016 is consistent with disease progression  (a) repeat PET scan 02/02/2017 confirms increase in lymph nodes  (b) PET scan 04/20/2017 shows increasing hypermetabolic adenopathy  (c) bronchoscopy/mediastinoscopy 05/05/2017 shows granulomatous disease, with negative AFB/ GMS stains; <10% of lymphocytes are B-cell with slight lambda excess  (d) PET scan on 07/06/2017 shows mild increase in mediastinal lymphadenopathy. Increased hypermetabolic supraclavicular lymph node.   (7) ibrutinib 420 mg daily started 02/09/2017, discontinued March 2019  (a) resumed April 2019 at 560 mg daily  (8) granulomatous  inflammation noted on pathology from 05/05/2017 mediastinoscopy  (a) fungal bacterial and acid-fast studies negative to date  (b) consider sarcoid  PLAN: [name] is now 2-1/2 years out from initial diagnosis of mantle cell lymphoma.  He completed his chemotherapy 2 years ago.  We have been following some adenopathy, but it is not clear whether this represents lymphoma or sarcoid: Biopsy of a mediastinal lymph node April of this year showed granulomatous inflammation.  At any rate he is tolerating the current dose of ibrutinib well and we see some evidence of response in the current PET scan.  I am delighted that he is doing so well.  I am going to see him again in November.  Shortly before that visit he [name] have a repeat PET scan.  I have encouraged him to let me know of any side effects he may be experiencing including issues related to cost.  I also encouraged him to drink more water during the day.  Hopefully that [name] take care of his cramps.  Note that his magnesium, calcium, and potassium are normal.  He knows to call for any other issues that may develop before the next visit.    Magrinat, Virgie Dad, MD  09/07/17 8:15 AM Medical Oncology and Hematology [facility] [address] [place], [place] [address] Tel. [phone]    Fax. [phone]    I, Soijett Blue am acting as scribe for Dr. Sarajane Jews C. Magrinat.  I, Lurline Del MD, have reviewed the above documentation for accuracy and completeness, and I agree with the above.

## 2017-09-07 ENCOUNTER — Telehealth: Payer: Self-pay | Admitting: Oncology

## 2017-09-07 ENCOUNTER — Inpatient Hospital Stay: Payer: BLUE CROSS/BLUE SHIELD | Attending: Hematology and Oncology | Admitting: Oncology

## 2017-09-07 VITALS — BP 121/72 | HR 67 | Temp 98.4°F | Resp 18 | Ht 70.0 in | Wt 201.7 lb

## 2017-09-07 DIAGNOSIS — R590 Localized enlarged lymph nodes: Secondary | ICD-10-CM | POA: Diagnosis not present

## 2017-09-07 DIAGNOSIS — Z79899 Other long term (current) drug therapy: Secondary | ICD-10-CM | POA: Diagnosis not present

## 2017-09-07 DIAGNOSIS — I10 Essential (primary) hypertension: Secondary | ICD-10-CM | POA: Diagnosis not present

## 2017-09-07 DIAGNOSIS — K264 Chronic or unspecified duodenal ulcer with hemorrhage: Secondary | ICD-10-CM | POA: Diagnosis not present

## 2017-09-07 DIAGNOSIS — K219 Gastro-esophageal reflux disease without esophagitis: Secondary | ICD-10-CM | POA: Insufficient documentation

## 2017-09-07 DIAGNOSIS — Z9221 Personal history of antineoplastic chemotherapy: Secondary | ICD-10-CM | POA: Diagnosis not present

## 2017-09-07 DIAGNOSIS — D649 Anemia, unspecified: Secondary | ICD-10-CM | POA: Insufficient documentation

## 2017-09-07 DIAGNOSIS — C8319 Mantle cell lymphoma, extranodal and solid organ sites: Secondary | ICD-10-CM | POA: Insufficient documentation

## 2017-09-07 DIAGNOSIS — E785 Hyperlipidemia, unspecified: Secondary | ICD-10-CM | POA: Diagnosis not present

## 2017-09-07 MED ORDER — IBRUTINIB 560 MG PO TABS: 560.0000 mg | ORAL_TABLET | Freq: Every morning | ORAL | 3 refills | Status: DC

## 2017-09-07 NOTE — Telephone Encounter (Signed)
Gave avs and calendar ° °

## 2017-10-01 MED FILL — IMBRUVICA 560 MG TAB: 560 | 28 days supply | Qty: 28 | Fill #3

## 2017-10-16 ENCOUNTER — Telehealth: Payer: Self-pay | Admitting: Pharmacist

## 2017-10-16 NOTE — Telephone Encounter (Signed)
Oral Chemotherapy Pharmacist Encounter  Follow-Up Form  Spoke with patient today to follow up regarding patient's oral chemotherapy medication: Imbruvica (ibrutinib)for the treatment of previously treatment mantle cell lymphoma, planned durationuntil disease progression or unacceptable toxicity.  Original Start date of oral chemotherapy: ibrutinib 420 mg daily started 02/09/2017, discontinued March 2019, resumed April 2019 at 560 mg daily  Pt is doing well today  Pt reports 0 tablets/doses of Imbruvica 560mg  tablets, 1 tablet taken by mouth once daily with a glass of water, without regard to food, missed in the last month.   Pt reports the following side effects: None to report, patient denies change in bowel habits or any bruising.  He does state that his energy level is slightly decreased however he continues to work full-time, and continues to exercise on a daily basis.  Pertinent labs reviewed: Okay for continued treatment.  Confirmed office visits at the end of October and beginning of November with patient Reminded patient that he will be having a PET scan prior to next office visit with Dr. Jana Hakim, and that he should receive a call from central scheduling sometime in October to get him scheduled for this imaging study.  Patient states he has no issues with Imbruvica acquisition from the [facility].  Patient knows to call the office with questions or concerns. Oral Oncology Clinic will continue to follow.  Johny Drilling, PharmD, BCPS, BCOP  10/16/2017 10:20 AM Oral Oncology Clinic [phone]

## 2017-10-28 MED FILL — IMBRUVICA 560 MG TAB: 560 | 28 days supply | Qty: 28 | Fill #4

## 2017-11-26 ENCOUNTER — Telehealth: Payer: Self-pay | Admitting: Oncology

## 2017-11-26 MED FILL — IMBRUVICA 560 MG TAB: 560 | 28 days supply | Qty: 28 | Fill #5

## 2017-11-26 NOTE — Telephone Encounter (Signed)
Patient called with questions  about 10/28

## 2017-11-30 ENCOUNTER — Inpatient Hospital Stay: Payer: BLUE CROSS/BLUE SHIELD | Attending: Hematology and Oncology

## 2017-11-30 DIAGNOSIS — C8319 Mantle cell lymphoma, extranodal and solid organ sites: Secondary | ICD-10-CM | POA: Diagnosis not present

## 2017-11-30 DIAGNOSIS — D649 Anemia, unspecified: Secondary | ICD-10-CM | POA: Insufficient documentation

## 2017-11-30 DIAGNOSIS — I10 Essential (primary) hypertension: Secondary | ICD-10-CM | POA: Diagnosis not present

## 2017-11-30 DIAGNOSIS — Z79899 Other long term (current) drug therapy: Secondary | ICD-10-CM | POA: Insufficient documentation

## 2017-11-30 LAB — CBC WITH DIFFERENTIAL/PLATELET
Abs Immature Granulocytes: 0.05 10*3/uL (ref 0.00–0.07)
BASOS ABS: 0 10*3/uL (ref 0.0–0.1)
Basophils Relative: 1 %
EOS ABS: 0.1 10*3/uL (ref 0.0–0.5)
EOS PCT: 1 %
HEMATOCRIT: 39 % (ref 39.0–52.0)
Hemoglobin: 12.5 g/dL — ABNORMAL LOW (ref 13.0–17.0)
Immature Granulocytes: 1 %
LYMPHS ABS: 0.9 10*3/uL (ref 0.7–4.0)
Lymphocytes Relative: 21 %
MCH: 27.7 pg (ref 26.0–34.0)
MCHC: 32.1 g/dL (ref 30.0–36.0)
MCV: 86.5 fL (ref 80.0–100.0)
MONO ABS: 0.8 10*3/uL (ref 0.1–1.0)
MONOS PCT: 18 %
NRBC: 0 % (ref 0.0–0.2)
Neutro Abs: 2.5 10*3/uL (ref 1.7–7.7)
Neutrophils Relative %: 58 %
Platelets: 286 10*3/uL (ref 150–400)
RBC: 4.51 MIL/uL (ref 4.22–5.81)
RDW: 14.2 % (ref 11.5–15.5)
WBC: 4.3 10*3/uL (ref 4.0–10.5)

## 2017-11-30 LAB — COMPREHENSIVE METABOLIC PANEL
ALK PHOS: 34 U/L — AB (ref 38–126)
ALT: 15 U/L (ref 0–44)
ANION GAP: 10 (ref 5–15)
AST: 16 U/L (ref 15–41)
Albumin: 3.9 g/dL (ref 3.5–5.0)
BILIRUBIN TOTAL: 0.6 mg/dL (ref 0.3–1.2)
BUN: 22 mg/dL (ref 8–23)
CALCIUM: 8.8 mg/dL — AB (ref 8.9–10.3)
CO2: 27 mmol/L (ref 22–32)
Chloride: 102 mmol/L (ref 98–111)
Creatinine, Ser: 1.24 mg/dL (ref 0.61–1.24)
GFR, EST NON AFRICAN AMERICAN: 60 mL/min — AB (ref >60)
GLUCOSE: 90 mg/dL (ref 70–99)
POTASSIUM: 4.1 mmol/L (ref 3.5–5.1)
Sodium: 139 mmol/L (ref 135–145)
TOTAL PROTEIN: 6.7 g/dL (ref 6.5–8.1)

## 2017-11-30 LAB — LACTATE DEHYDROGENASE: LDH: 253 U/L — ABNORMAL HIGH (ref 98–192)

## 2017-12-01 LAB — BETA 2 MICROGLOBULIN, SERUM: Beta-2 Microglobulin: 1.6 mg/L (ref 0.6–2.4)

## 2017-12-04 NOTE — Progress Notes (Signed)
[place]  [phone]) [phone] [phone]    ID: MIRKO TAILOR DOB: [birth year]  MR#: [id number]  YBO#:[id number]  Patient Care Team: Shelda Pal, DO as PCP - General (Family Medicine) Lucia Mccreadie, Virgie Dad, MD as Consulting Physician (Oncology) Donnie Mesa, MD as Consulting Physician (General Surgery) Wilford Corner, MD as Consulting Physician (Gastroenterology) Ples Specter, MD as Referring Physician (Internal Medicine) Larina Earthly, MD as Referring Physician (Hematology and Oncology) Grace Isaac, MD as Consulting Physician (Cardiothoracic Surgery) PCP: Shelda Pal, DO OTHER MD: Napoleon Form MD; Dr Glennon Mac [phone]  CHIEF COMPLAINT: 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 He continues on ibrutinib, currently at 560 mg daily, which he tolerates well. Can tell when medication is in his system and causes him short period of fatigue. To counteract this he drinks V8 fusion with green tea and ginseng to give him energy. Last headache was approximately 3 weeks ago. He denies B-symptoms: drenching sweat, unexplained weight loss, adenopathy, or fever.   REVIEW OF SYSTEMS: Kali reports that for exercise, he weight lifts. His right knee has been giving him issues. He fell while playing basketball. If he sits for extended periods of time due to his job he notices tension in his knee but at other times his knee feels fine.. She denies unusual headaches, visual changes, nausea, vomiting, or dizziness. There has been no unusual cough, phlegm production, or pleurisy. This been no change in bowel or bladder habits. She denies unexplained fatigue or unexplained weight loss, bleeding, rash, or fever. A detailed review of systems was otherwise stable.  ;    HISTORY OF PRESENT ILLNESS: From the original intake note:   "[name]" was in his usual good health until early December 2016, when he had a brief syncopal episode. He "blew this off" and it did not happen again until 01/17/2015 when he felt weak at work and later fainted at home. He was brought to the ED where he was found to be guaiac positive (denies melena before that date) with a Hb of 4.5 MCV 82.2, platelets 295K, WBC 9.1 and normal INR and PTT. He was admitted and underwent EGD under Dr Michail Sermon 01/18/2015 showing a very large duodenal ulcer w clot. Abdominal US 01/19/2015 was not very informative but CT abd/pelvis 12/17,2016 showed adenopathy in the anterior mediastinum, retroperitoneum and pelvis, with a large ulcerated mass involving the distal stomach/proximal duodenum and extending into the gastrohepatic region.   On 01/22/2015 he underwent right inguinal lymph node biopsy, consistent with mantle cell lymphoma (([id number] and [id number])). Specifically the lymph node showed effacement of the architecture by sheets of small to medium sized lymphocytes with no apparent nodularity. The cells were positive for CD20, CD5, BCL-2, CD21, and cyclin D1. He 65 was low (10-20%). CD21 revealed a few residual germinal centers area at the B cells were negative for CD10, and BCL-6. CD23 was negative.  On 02/06/2015 the patient underwent a bone marrow aspirate and biopsy. The pathology from this procedure (FZB 17-2 and 17-7) was positive for involvement by non-Hodgkin's lymphoma with numerous atypical interstitial and paratrabecular lymphoid aggregates consistent with mantle cell lymphoma. Iron stain showed scant iron flow cytometry confirmed a monoclonal B-cell population, lambda restricted, consistent with the patient's known diagnosis of mantle cell lymphoma.  On 01/18/2015 the patient underwent EGD which found a  massive duodenal bulb ulcer with adherent clot, although no active bleeding.his  hemoglobin was in the 8.4 range and stable. He was discharged on 01/22/2015, but within 24 hours was readmitted with syncope and altered mental status. He had aspirated and required intubation for his respiratory failure. Hemoglobin was down to 6.3. An NG tube was placed, the patient was multiply transfused, and Surgery was considered but felt not to be feasible.accordingly the patient underwent arteriography 01/23/2015 which found the superior mesenteric and celiac arteries not to be the involved vessels. The gastroduodenal artery showed multiple branches providing supply to the hypervascular duodenal mass, and this artery was coil embolized.. Also a port was placed at the time of this procedure with a view to eventual chemotherapy.  He was discharged 01/26/2015,but required readmission 02/07/2015 for another presyncopal episode. He presented to the emergency room where he was found to have a hemoglobin of 7.5. The patient was transfusedIIA hemoglobin of 10 and was discharged 02/07/2014. He was scheduled to start CHOP right toxin 02/08/2014.  His subsequent history is as detailed below. PAST MEDICAL HISTORY: Past Medical History:  Diagnosis Date  . Anemia   . Cancer ([place])    Lymphoma  . Essential hypertension   . GERD (gastroesophageal reflux disease)   . GI bleed   . HLD (hyperlipidemia)   . Weakness     PAST SURGICAL HISTORY: Past Surgical History:  Procedure Laterality Date  . ESOPHAGOGASTRODUODENOSCOPY Left 01/18/2015   Procedure: ESOPHAGOGASTRODUODENOSCOPY (EGD);  Surgeon: Wilford Corner, MD;  [facility] ENDOSCOPY;  Service: Endoscopy;  Laterality: Left;  . INGUINAL HERNIA REPAIR Right 01/22/2015   Procedure: RIGHT INGUINAL LYMPH NODE BX;  Surgeon: Donnie Mesa, MD;  Location: [place];  Service: General;  Laterality: Right;  . MEDIASTINOSCOPY N/A 05/05/2017   Procedure: MEDIASTINOSCOPY;  Surgeon: Grace Isaac, MD;  Location: [facility];  Service: Thoracic;  Laterality: N/A;  .  PORTACATH PLACEMENT  01/26/2015    power port with tip SVC/RA [place]  . SKIN SURGERY     Small benign cysts over left scalp removed  . VIDEO BRONCHOSCOPY WITH ENDOBRONCHIAL ULTRASOUND N/A 05/05/2017   Procedure: VIDEO BRONCHOSCOPY WITH ENDOBRONCHIAL ULTRASOUND;  Surgeon: Grace Isaac, MD;  Location: [facility] OR;  Service: Thoracic;  Laterality: N/A;    FAMILY HISTORY Family History  Problem Relation Age of Onset  . Hypertension Mother   . Hypertension Father   . Kidney failure Father   . Hypertension Sister   . Diabetes Sister   . Prostate cancer Brother   . Lupus Sister    The patient's father died from CHF complications age 16. The patient's mother is 33 y/o as of December 2016. The patient has 9 brothers, 8 sisters. One brother has prostate cancer. There is no other cancer history in the family to his knowledge.  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 His wife of 65 years, [name], is disabled due to RA. Daughter, [name] works as a Teacher, early years/pre for [employer] (she previously worked at the [employer] in records). Son, [name] owns a window washing business. Son, [name] is in Press photographer at [employer]. All live in [place]. The patient has 3 grandchildren. He attends a [employer].    ADVANCED DIRECTIVES: Not in place   HEALTH MAINTENANCE: Social History   Tobacco Use  . Smoking status: Never Smoker  . Smokeless tobacco: Never Used  Substance Use Topics  . Alcohol use: Yes  Comment: occasional beer  . Drug use: No     Colonoscopy:  PSA: 2.27 on 01/22/2015  Hepatitis serologies (B surface antigen, hepatitis B core antibody and hepatitis C IgM) negative 01/18/2015  No Known Allergies  Current Outpatient Medications  Medication Sig Dispense Refill  . Ibrutinib 560 MG TABS Take 560 mg by mouth every morning. 84 tablet 3  . lisinopril-hydrochlorothiazide (PRINZIDE,ZESTORETIC) 20-25 MG tablet TAKE 1 TABLET BY  MOUTH EVERY DAY 90 tablet 1  . lovastatin (MEVACOR) 20 MG tablet TAKE 1 TABLET BY MOUTH EVERYDAY AT BEDTIME (Patient taking differently: Take 20 mg by mouth at bedtime. TAKE 1 TABLET BY MOUTH EVERYDAY AT BEDTIME) 90 tablet 1   No current facility-administered medications for this visit.     OBJECTIVE: Middle-aged African-American man who appears well  Vitals:   12/07/17 0828  BP: (!) 129/59  Pulse: 68  Resp: 18  Temp: 97.8 F (36.6 C)  SpO2: 100%     Body mass index is 29.31 kg/m.    ECOG FS:0 - Asymptomatic  Sclerae unicteric, EOMs intact Oropharynx clear and moist No cervical or supraclavicular adenopathy, no axillary or inguinal adenopathy  lungs no rales or rhonchi Heart regular rate and rhythm Abd soft, nontender, positive bowel sounds MSK no focal spinal tenderness, no upper extremity lymphedema Neuro: nonfocal, well oriented, appropriate affect   LAB RESULTS:  CMP     Component Value Date/Time   NA 139 11/30/2017 0734   NA 140 01/16/2017 0837   K 4.1 11/30/2017 0734   K 3.9 01/16/2017 0837   CL 102 11/30/2017 0734   CO2 27 11/30/2017 0734   CO2 24 01/16/2017 0837   GLUCOSE 90 11/30/2017 0734   GLUCOSE 102 01/16/2017 0837   BUN 22 11/30/2017 0734   BUN 20.4 01/16/2017 0837   CREATININE 1.24 11/30/2017 0734   CREATININE 1.2 01/16/2017 0837   CALCIUM 8.8 (L) 11/30/2017 0734   CALCIUM 8.9 01/16/2017 0837   PROT 6.7 11/30/2017 0734   PROT 6.9 01/16/2017 0837   ALBUMIN 3.9 11/30/2017 0734   ALBUMIN 4.0 01/16/2017 0837   AST 16 11/30/2017 0734   AST 20 01/16/2017 0837   ALT 15 11/30/2017 0734   ALT 22 01/16/2017 0837   ALKPHOS 34 (L) 11/30/2017 0734   ALKPHOS 47 01/16/2017 0837   BILITOT 0.6 11/30/2017 0734   BILITOT 0.36 01/16/2017 0837   GFRNONAA 60 (L) 11/30/2017 0734   GFRAA >60 11/30/2017 0734    INo results found for: SPEP, UPEP  Lab Results  Component Value Date   WBC 4.3 11/30/2017   NEUTROABS 2.5 11/30/2017   HGB 12.5 (L) 11/30/2017    HCT 39.0 11/30/2017   MCV 86.5 11/30/2017   PLT 286 11/30/2017      Chemistry      Component Value Date/Time   NA 139 11/30/2017 0734   NA 140 01/16/2017 0837   K 4.1 11/30/2017 0734   K 3.9 01/16/2017 0837   CL 102 11/30/2017 0734   CO2 27 11/30/2017 0734   CO2 24 01/16/2017 0837   BUN 22 11/30/2017 0734   BUN 20.4 01/16/2017 0837   CREATININE 1.24 11/30/2017 0734   CREATININE 1.2 01/16/2017 0837      Component Value Date/Time   CALCIUM 8.8 (L) 11/30/2017 0734   CALCIUM 8.9 01/16/2017 0837   ALKPHOS 34 (L) 11/30/2017 0734   ALKPHOS 47 01/16/2017 0837   AST 16 11/30/2017 0734   AST 20 01/16/2017 0837   ALT 15 11/30/2017 0734  ALT 22 01/16/2017 0837   BILITOT 0.6 11/30/2017 0734   BILITOT 0.36 01/16/2017 0837       No results found for: LABCA2  No components found for: LABCA125  No results for input(s): INR in the last 168 hours.  Urinalysis    Component Value Date/Time   COLORURINE YELLOW 01/23/2015 0805   APPEARANCEUR CLEAR 01/23/2015 0805   LABSPEC 1.014 01/23/2015 0805   PHURINE 7.5 01/23/2015 0805   GLUCOSEU NEGATIVE 01/23/2015 0805   HGBUR NEGATIVE 01/23/2015 0805   BILIRUBINUR NEGATIVE 01/23/2015 0805   KETONESUR NEGATIVE 01/23/2015 0805   PROTEINUR NEGATIVE 01/23/2015 0805   NITRITE NEGATIVE 01/23/2015 0805   LEUKOCYTESUR NEGATIVE 01/23/2015 0805   Beta-2 microglobulin and LDH are in the normal range  STUDIES: PET scan was supposed to have been done prior to today's visit but for some reason that did not happen  ASSESSMENT: 67 y.o. [place] man with a diagnosis of mantle cell non-Hodgkin's lymphoma of extranodal and solid organ sites presenting with syncope secondary to bleeding from a large ulcerated duodenal ulcer  (a) s/p coil embolization of the feeding (gastro-duodenal) artery 01/23/2015  (b) anemia--scant iron stores on bone marrow biopsy--s/p feraheme 02/13/2015  (1) right inguinal lymph node biopsy 01/22/2015 confirms mantle cell  lymphoma  (a) bone marrow biopsy 02/06/2015 positive for involvement by the patient's mantle cell lymphoma  (b) IPI score of 5 (high risk) predicts a 5 year progression free survival of 50% with CHOP-Rituxan chemotherapy  (c) MIPI score of 5 (intermediate risk) predicts a median survival of 58 months  (2) CHOP/Rituxan started 02/09/2015, completed 8 cycles 07/05/2015  (3)  [facility] consultation 04/06/2015. Patient opted against transplant consolidation  (4) rituximab maintenance started 08/16/2015, repeat every 2 months, discontinued February 2019  (a) PET scan had shown a suggestion of recurrence, but biopsy was negative  (5) PSA increase noted July 2017, back to baseline by December 2017  (6) restaging PET scan 11/17/2016 is consistent with disease progression  (a) repeat PET scan 02/02/2017 confirms increase in lymph nodes  (b) PET scan 04/20/2017 shows increasing hypermetabolic adenopathy  (c) bronchoscopy/mediastinoscopy 05/05/2017 shows granulomatous disease, with negative AFB/ GMS stains; <10% of lymphocytes are B-cell with slight lambda excess  (d) PET scan on 07/06/2017 shows mild increase in mediastinal lymphadenopathy. Increased hypermetabolic supraclavicular lymph node.   (7) ibrutinib 420 mg daily started 02/09/2017, discontinued March 2019  (a) resumed April 2019 at 560 mg daily  (8) granulomatous inflammation noted on pathology from 05/05/2017 mediastinoscopy  (a) fungal bacterial and acid-fast studies negative to date  (b) consider sarcoid  PLAN: [name] has been on ibrutinib nearly a year at this point.  He tolerates it well, with minimal fatigue.  The plan is to continue that indefinitely until there is evidence of disease progression.  He was supposed to have had a PET scan prior to today's visit but that was not scheduled even though he called.  We are going to try to get that done later this month and I stressed to get that result I [name] give him a call.  If there is  stability or better response we [name] continue.  Otherwise we [name] consider adding r a toxin  I offered him referral to a sports specialist for his right knee problems he wants to wait a little on that  He [name] see me again in February.  He knows to call for any other issues that may develop before the next visit    Zimal Weisensel, Virgie Dad,  MD  12/07/17 8:58 AM Medical Oncology and Hematology [facility] [address] [place], [place] [address] Tel. [phone]    Fax. [phone]    Elie Goody, am acting as scribe for Dr. Virgie Dad. Crosby Oriordan.  I, Lurline Del MD, have reviewed the above documentation for accuracy and completeness, and I agree with the above.

## 2017-12-07 ENCOUNTER — Inpatient Hospital Stay: Payer: BLUE CROSS/BLUE SHIELD | Attending: Hematology and Oncology | Admitting: Oncology

## 2017-12-07 ENCOUNTER — Telehealth: Payer: Self-pay | Admitting: Oncology

## 2017-12-07 VITALS — BP 129/59 | HR 68 | Temp 97.8°F | Resp 18 | Ht 70.0 in | Wt 204.3 lb

## 2017-12-07 DIAGNOSIS — C8319 Mantle cell lymphoma, extranodal and solid organ sites: Secondary | ICD-10-CM

## 2017-12-07 DIAGNOSIS — I10 Essential (primary) hypertension: Secondary | ICD-10-CM | POA: Diagnosis not present

## 2017-12-07 DIAGNOSIS — D649 Anemia, unspecified: Secondary | ICD-10-CM | POA: Insufficient documentation

## 2017-12-07 DIAGNOSIS — Z79899 Other long term (current) drug therapy: Secondary | ICD-10-CM

## 2017-12-07 NOTE — Telephone Encounter (Signed)
Gave pt avs and calendar  °

## 2017-12-16 ENCOUNTER — Other Ambulatory Visit: Payer: Self-pay | Admitting: *Deleted

## 2017-12-24 ENCOUNTER — Telehealth: Payer: Self-pay | Admitting: *Deleted

## 2017-12-24 ENCOUNTER — Ambulatory Visit (HOSPITAL_COMMUNITY)
Admission: RE | Admit: 2017-12-24 | Discharge: 2017-12-24 | Disposition: A | Discharge status: Home / Self Care | Payer: BLUE CROSS/BLUE SHIELD | Source: Ambulatory Visit | Attending: Oncology | Admitting: Oncology

## 2017-12-24 DIAGNOSIS — C8319 Mantle cell lymphoma, extranodal and solid organ sites: Secondary | ICD-10-CM | POA: Diagnosis not present

## 2017-12-24 LAB — GLUCOSE, CAPILLARY: Glucose-Capillary: 99 mg/dL (ref 70–99)

## 2017-12-24 IMAGING — CT NM PET TUM IMG RESTAG (PS) SKULL BASE T - THIGH
8 series · 21 of 25 positions shown · non-contrast
Comparison: [DATE]

CLINICAL DATA: Subsequent treatment strategy for mantle cell
lymphoma.

EXAM:
NUCLEAR MEDICINE PET SKULL BASE TO THIGH
TECHNIQUE: 10.1 mCi F-18 FDG was injected intravenously. Full-ring PET imaging
was performed from the skull base to thigh after the radiotracer. CT
data was obtained and used for attenuation correction and anatomic
localization.
Fasting blood glucose: 99 mg/dl

[Series 3: pet sk_thigh ac · axial · 5.0mm · 4.07mm/px · z∈[-1432,-840]mm · 3 of 223 slices shown]
[im 1/223]
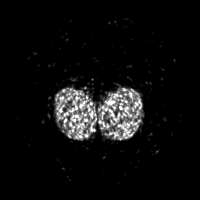
[im 75/223]
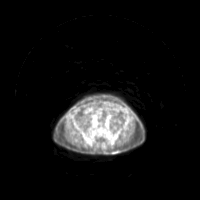
[im 149/223]
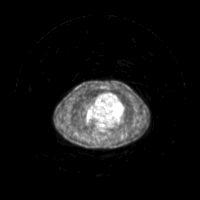

[Series 4: ct sk_thigh 5.0 b31f · axial · 5.0mm · 0.98mm/px · z∈[-1432,-544]mm · 5 of 223 slices shown]
[im 1/223  brain]
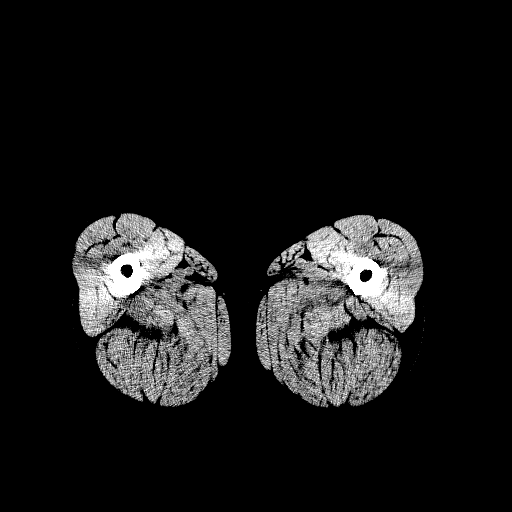
[im 56/223  brain]
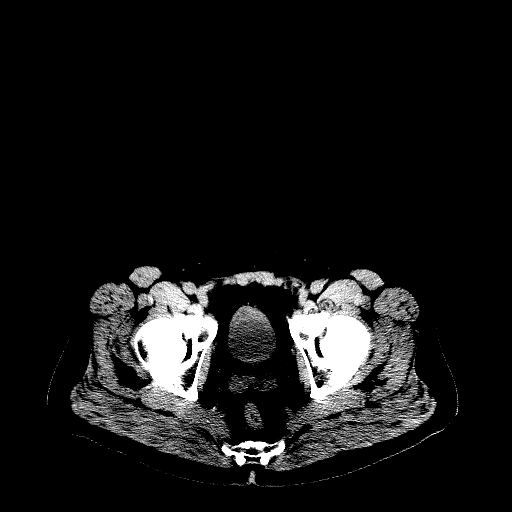
[im 112/223  brain]
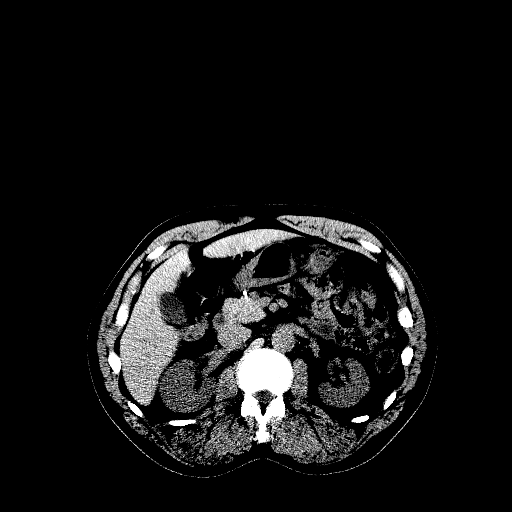
[im 167/223]
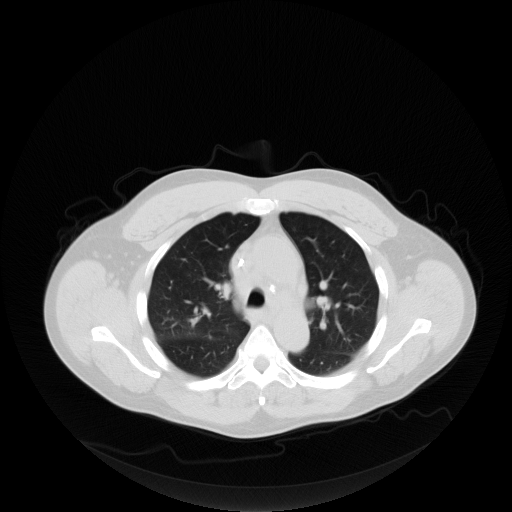
[im 223/223  brain]
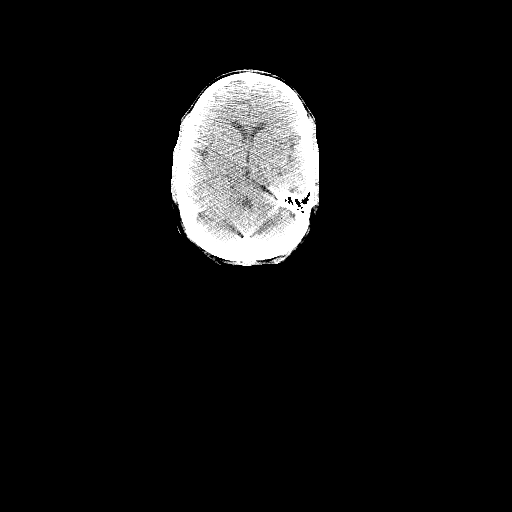

[Series 5: pet sk_thigh nac · axial · 5.0mm · 4.07mm/px · z∈[-1212,-544]mm · 4 of 223 slices shown]
[im 56/223]
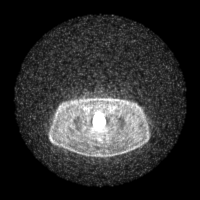
[im 112/223]
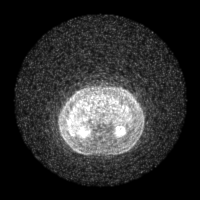
[im 167/223]
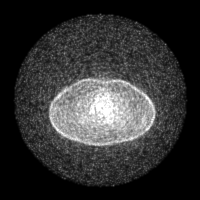
[im 223/223]
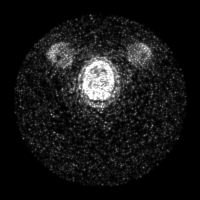

[Series 8: ct sk_thigh 5.0 b70f (id)_bone · axial · 5.0mm · 0.71mm/px · 1 of 74 slices shown]
[im 1/74  bone]
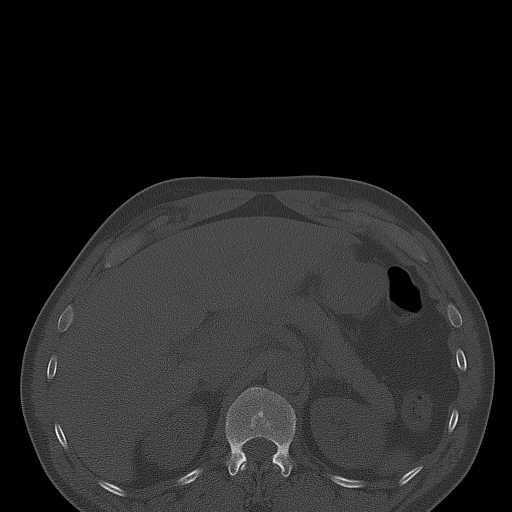

[Series 603: range-ct sk_thigh 5.0 (id)<alpha range> · 2 of 85 slices shown (1 of 2)]
[im 1/85]
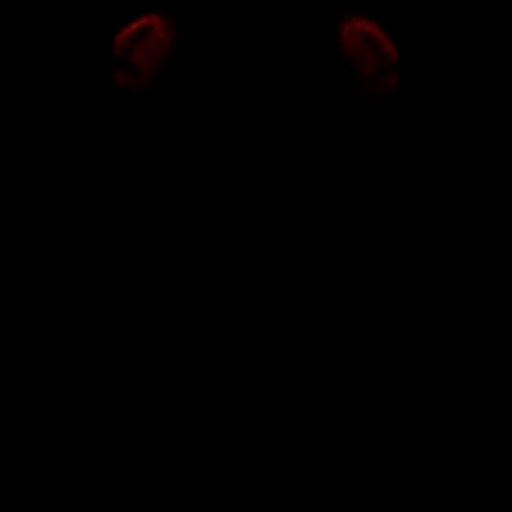
[im 85/85]
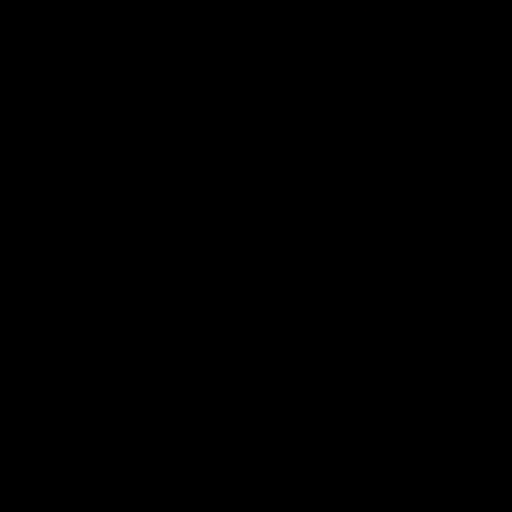

[Series 604: mip range · coronal · 1.84mm/px · 1 of 32 slices shown]
[im 1/32]
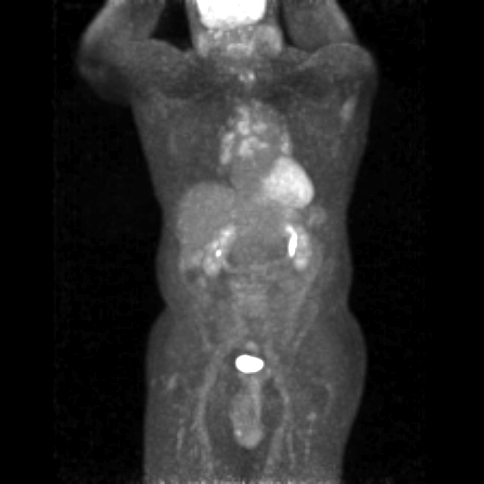

[Series 605: range-ct sk_thigh 5.0 (id)<alpha range> · 4 of 212 slices shown (2 of 2)]
[im 1/212]
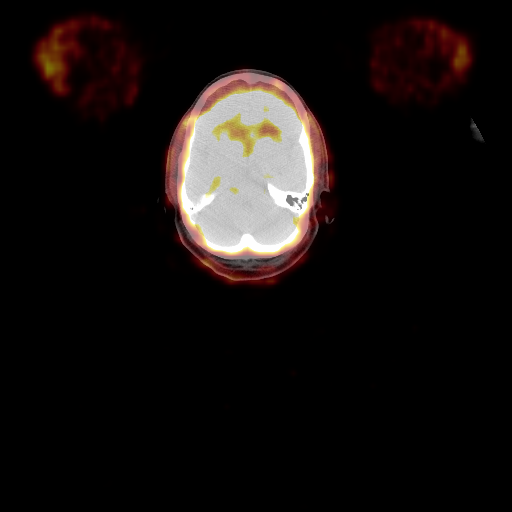
[im 53/212]
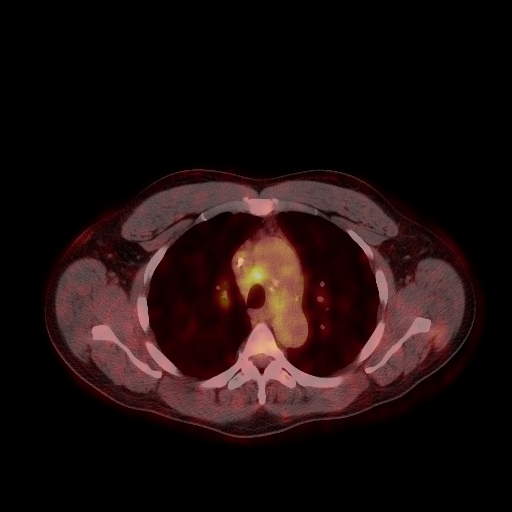
[im 159/212]
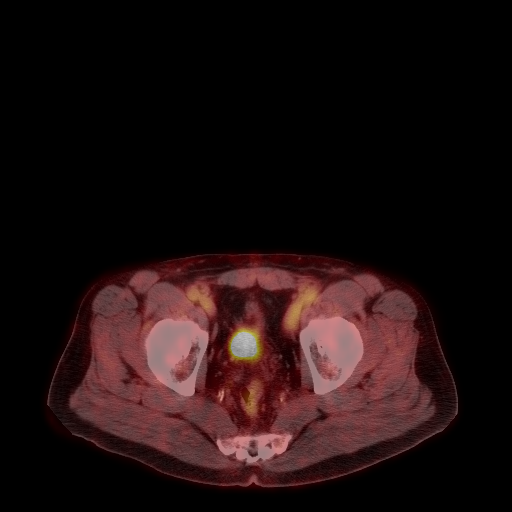
[im 212/212]
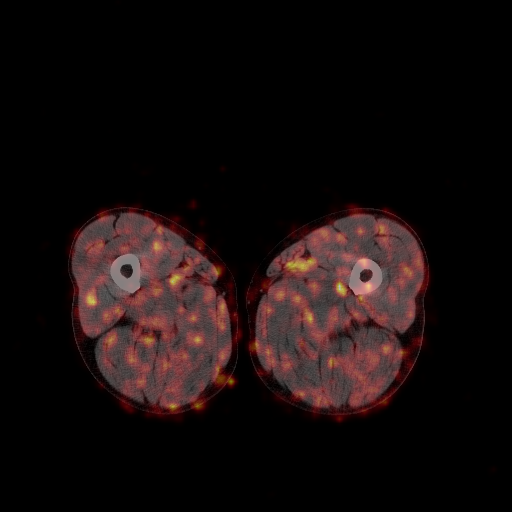

[Series 2299: results mm oncology reading · 5.0mm · 0.50mm/px · 1 of 5 slices shown]
[im 1/5]
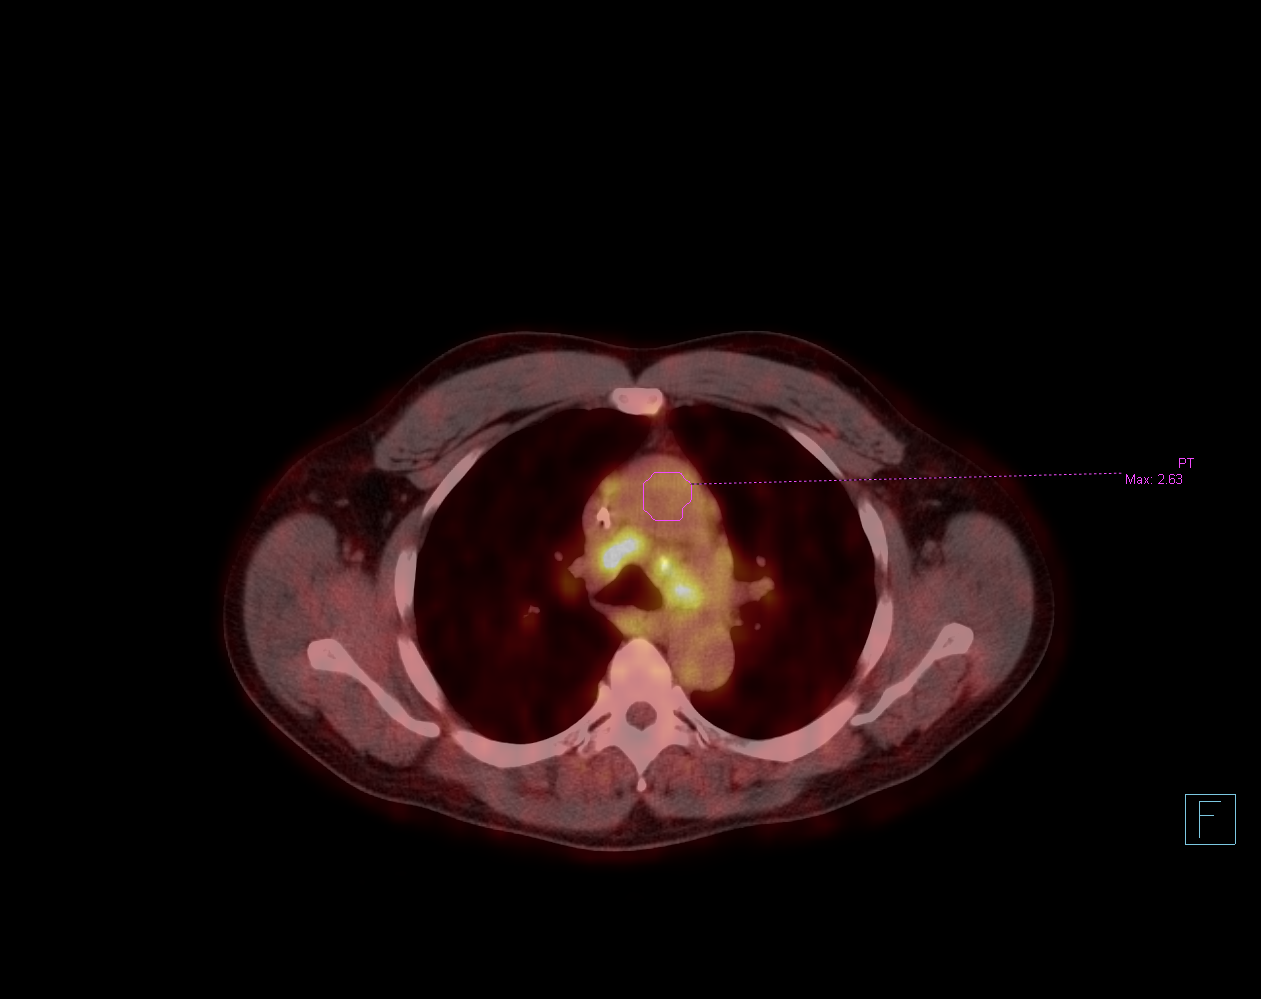

[21 of 25 positions shown; findings below may reference images not displayed]

FINDINGS: Mediastinal blood pool activity: SUV max

Liver activity: SUV max 3.14.

NECK: No hypermetabolic lymph nodes in the neck.

Incidental CT findings: none

CHEST: No hypermetabolic axillary or supraclavicular lymph nodes.
Enlarged and hypermetabolic mediastinal and hilar lymph nodes are
again noted.

-index right paratracheal lymph node measures 1.9 cm within SUV max
7.05. Previously 2.4 cm with SUV max of 14.14.

-index subcarinal lymph node measures 2 cm with SUV max of 7.13.
Previously 2 cm with SUV max of 14.05.

-index right hilar lymph node has an SUV max of 6.59. Previously
11.23.

Incidental CT findings: No suspicious pulmonary nodules identified.
Scarring within the lingula and right middle lobe noted. Aortic
atherosclerosis identified. Calcifications in the LAD and RCA
coronary arteries noted.

ABDOMEN/PELVIS: No abnormal hypermetabolic activity within the
liver, pancreas, adrenal glands, or spleen. No hypermetabolic lymph
nodes in the abdomen or pelvis.

Incidental CT findings: none

SKELETON: No focal hypermetabolic activity to suggest skeletal
metastasis.

Incidental CT findings: none
IMPRESSION: 1. Partial response to therapy. Interval decrease in SUV max
associated with hypermetabolic mediastinal and bilateral hilar lymph
nodes as above. Persistent FDG uptake within the mediastinal and
hilar lymph nodes are identified compatible with [HOSPITAL] criteria
[DATE] lesions. Findings reflect persistent metabolically active
disease.
2. No new sites of disease identified.

## 2017-12-24 MED ORDER — FLUDEOXYGLUCOSE F - 18 (FDG) INJECTION
10.1000 | Freq: Once | INTRAVENOUS | Status: AC | PRN
  Administered 2017-12-24: 10.1 via INTRAVENOUS

## 2017-12-24 MED FILL — IMBRUVICA 560 MG TAB: 560 | 28 days supply | Qty: 28 | Fill #6

## 2017-12-24 NOTE — Telephone Encounter (Signed)
This RN returned VM to pt per his inquiry regarding pending retirement 02/02/2018 " but when I spoke to the [employer] they said I have an open disability that does not expire until May of 2021 and that I should re instate my disability before taking retirement "  This RN informed pt this office would be able to provide medical records- but unsure about how to proceed with re instating disability.  Broedy will speak with his HR department and let this office know what needs to be provided.  No other needs at this time.

## 2017-12-28 ENCOUNTER — Ambulatory Visit (HOSPITAL_COMMUNITY): Payer: BLUE CROSS/BLUE SHIELD

## 2018-01-08 ENCOUNTER — Other Ambulatory Visit: Payer: Self-pay | Admitting: Hematology and Oncology

## 2018-01-11 ENCOUNTER — Telehealth: Payer: Self-pay

## 2018-01-11 NOTE — Telephone Encounter (Signed)
I am not sure who initiated this PA but it is with [facility].  Oral Oncology Patient Advocate Encounter  Prior Authorization for [name] has been approved.    Effective dates: 01/08/18 through 01/09/19  Oral Oncology Clinic will continue to follow.   [place] Patient [place] Phone [phone] Fax [phone]

## 2018-01-20 MED FILL — IMBRUVICA 560 MG TAB: 560 | 28 days supply | Qty: 28 | Fill #7

## 2018-02-18 ENCOUNTER — Telehealth: Payer: Self-pay

## 2018-02-18 MED FILL — IMBRUVICA 560 MG TAB: 560 | 28 days supply | Qty: 28 | Fill #8

## 2018-02-18 NOTE — Telephone Encounter (Signed)
Oral Oncology Patient Advocate Encounter  The patient now has Part D prescription insurance and the copay for the Imbruvica is $2857.09.   I was successful at securing a grant with [employer] for $8,500. This will keep the out of pocket expense for Imbruvica at $0. The grant information is as follows and has been shared with [facility].  Approval dates: 01/19/18-01/19/19 ID: [id number] Group: [id number] BIN: [id number] PCN: [id number]  I called the patient and spoke to his wife, she verbalized understanding and great appreciation.   [place] Patient [place] Phone [phone] Fax [phone]

## 2018-02-24 ENCOUNTER — Other Ambulatory Visit: Payer: Self-pay | Admitting: Hematology and Oncology

## 2018-03-08 ENCOUNTER — Inpatient Hospital Stay: Payer: Medicare HMO | Attending: Hematology and Oncology

## 2018-03-08 DIAGNOSIS — K219 Gastro-esophageal reflux disease without esophagitis: Secondary | ICD-10-CM | POA: Insufficient documentation

## 2018-03-08 DIAGNOSIS — E785 Hyperlipidemia, unspecified: Secondary | ICD-10-CM | POA: Diagnosis not present

## 2018-03-08 DIAGNOSIS — I10 Essential (primary) hypertension: Secondary | ICD-10-CM | POA: Insufficient documentation

## 2018-03-08 DIAGNOSIS — C8319 Mantle cell lymphoma, extranodal and solid organ sites: Secondary | ICD-10-CM | POA: Diagnosis not present

## 2018-03-08 DIAGNOSIS — Z79899 Other long term (current) drug therapy: Secondary | ICD-10-CM | POA: Diagnosis not present

## 2018-03-08 DIAGNOSIS — D649 Anemia, unspecified: Secondary | ICD-10-CM | POA: Insufficient documentation

## 2018-03-08 LAB — COMPREHENSIVE METABOLIC PANEL
ALBUMIN: 4.1 g/dL (ref 3.5–5.0)
ALT: 20 U/L (ref 0–44)
AST: 41 U/L (ref 15–41)
Alkaline Phosphatase: 37 U/L — ABNORMAL LOW (ref 38–126)
Anion gap: 10 (ref 5–15)
BILIRUBIN TOTAL: 0.6 mg/dL (ref 0.3–1.2)
BUN: 19 mg/dL (ref 8–23)
CO2: 27 mmol/L (ref 22–32)
CREATININE: 1.16 mg/dL (ref 0.61–1.24)
Calcium: 8.9 mg/dL (ref 8.9–10.3)
Chloride: 104 mmol/L (ref 98–111)
GFR calc Af Amer: >60 mL/min (ref >60)
GLUCOSE: 86 mg/dL (ref 70–99)
Potassium: 3.5 mmol/L (ref 3.5–5.1)
Sodium: 141 mmol/L (ref 135–145)
Total Protein: 6.9 g/dL (ref 6.5–8.1)

## 2018-03-08 LAB — CBC WITH DIFFERENTIAL/PLATELET
ABS IMMATURE GRANULOCYTES: 0.04 10*3/uL (ref 0.00–0.07)
Basophils Absolute: 0 10*3/uL (ref 0.0–0.1)
Basophils Relative: 1 %
Eosinophils Absolute: 0.1 10*3/uL (ref 0.0–0.5)
Eosinophils Relative: 1 %
HEMATOCRIT: 41.1 % (ref 39.0–52.0)
Hemoglobin: 13.2 g/dL (ref 13.0–17.0)
Immature Granulocytes: 1 %
LYMPHS ABS: 1 10*3/uL (ref 0.7–4.0)
Lymphocytes Relative: 20 %
MCH: 27.7 pg (ref 26.0–34.0)
MCHC: 32.1 g/dL (ref 30.0–36.0)
MCV: 86.3 fL (ref 80.0–100.0)
MONOS PCT: 20 %
Monocytes Absolute: 1 10*3/uL (ref 0.1–1.0)
NEUTROS ABS: 3 10*3/uL (ref 1.7–7.7)
NEUTROS PCT: 57 %
PLATELETS: 307 10*3/uL (ref 150–400)
RBC: 4.76 MIL/uL (ref 4.22–5.81)
RDW: 13.6 % (ref 11.5–15.5)
WBC: 5.2 10*3/uL (ref 4.0–10.5)
nRBC: 0 % (ref 0.0–0.2)

## 2018-03-08 LAB — LACTATE DEHYDROGENASE: LDH: 284 U/L — ABNORMAL HIGH (ref 98–192)

## 2018-03-09 LAB — BETA 2 MICROGLOBULIN, SERUM: Beta-2 Microglobulin: 1.6 mg/L (ref 0.6–2.4)

## 2018-03-12 NOTE — Progress Notes (Signed)
[place]  [phone] Fax:(336) [phone]    ID: [name] DOB: Feb 15, 1950  MR#: [id number]  QIO#:[id number]  Patient Care Team: Shelda Pal, DO as PCP - General (Family Medicine) , Virgie Dad, MD as Consulting Physician (Oncology) Donnie Mesa, MD as Consulting Physician (General Surgery) Wilford Corner, MD as Consulting Physician (Gastroenterology) Ples Specter, MD as Referring Physician (Internal Medicine) Larina Earthly, MD as Referring Physician (Hematology and Oncology) Grace Isaac, MD as Consulting Physician (Cardiothoracic Surgery) OTHER MD: Napoleon Form MD; Dr Glennon Mac [phone]   CHIEF COMPLAINT: 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   He continues on Ibrutinib.  He is tolerating the higher dose moderately well.  He has some fatigue. He treats this with a green tea energy drink when he feels particularly tired.   His last restaging PET on 12/24/2017 showed Partial response to therapy. Interval decrease in SUV max associated with hypermetabolic mediastinal and bilateral hilar lymph nodes as above. Persistent FDG uptake within the mediastinal and hilar lymph nodes are identified compatible with Deauville criteria 4/5 lesions. Findings reflect persistent metabolically active disease. No new sites of disease identified.   REVIEW OF SYSTEMS: Gyan thought about retiring 02/02/2018. He was informed it would be better for him to have his disability reinstated for full retirement benefits, but he has had some difficulty in getting the paperwork filed through the appropriate channels; he is requesting a letter to formally document his disability.  He has noticed that he has gained some weight. The patient denies typical 'B' symptoms of  fever, diaphoresis, weight loss, or fatigue. The patient denies unusual headaches, visual changes, nausea, vomiting, or dizziness. There has been no unusual cough, phlegm production, or pleurisy. This been no change in bowel or bladder habits. The patient denies bleeding or rash. A detailed review of systems was otherwise noncontributory.     HISTORY OF PRESENT ILLNESS: From the original intake note:   "Will" was in his usual good health until early December 2016, when he had a brief syncopal episode. He "blew this off" and it did not happen again until 01/17/2015 when he felt weak at work and later fainted at home. He was brought to the ED where he was found to be guaiac positive (denies melena before that date) with a Hb of 4.5 MCV 82.2, platelets 295K, WBC 9.1 and normal INR and PTT. He was admitted and underwent EGD under Dr Michail Sermon 01/18/2015 showing a very large duodenal ulcer w clot. Abdominal US 01/19/2015 was not very informative but CT abd/pelvis 12/17,2016 showed adenopathy in the anterior mediastinum, retroperitoneum and pelvis, with a large ulcerated mass involving the distal stomach/proximal duodenum and extending into the gastrohepatic region.   On 01/22/2015 he underwent right inguinal lymph node biopsy, consistent with mantle cell lymphoma (([id number] and FZB [id number])). Specifically the lymph node showed effacement of the architecture by sheets of small to medium sized lymphocytes with no apparent nodularity. The cells were positive for CD20, CD5, BCL-2, CD21, and cyclin D1. He 66 was low (10-20%). CD21 revealed a few residual germinal centers area at the B cells were negative for CD10, and BCL-6. CD23 was negative.  On 02/06/2015 the patient underwent a bone marrow aspirate and biopsy. The pathology from this procedure (FZB 17-2 and 17-7) was positive for involvement by non-Hodgkin's lymphoma with numerous  atypical interstitial and paratrabecular lymphoid aggregates consistent with  mantle cell lymphoma. Iron stain showed scant iron flow cytometry confirmed a monoclonal B-cell population, lambda restricted, consistent with the patient's known diagnosis of mantle cell lymphoma.  On 01/18/2015 the patient underwent EGD which found a massive duodenal bulb ulcer with adherent clot, although no active bleeding.his hemoglobin was in the 8.4 range and stable. He was discharged on 01/22/2015, but within 24 hours was readmitted with syncope and altered mental status. He had aspirated and required intubation for his respiratory failure. Hemoglobin was down to 6.3. An NG tube was placed, the patient was multiply transfused, and Surgery was considered but felt not to be feasible.accordingly the patient underwent arteriography 01/23/2015 which found the superior mesenteric and celiac arteries not to be the involved vessels. The gastroduodenal artery showed multiple branches providing supply to the hypervascular duodenal mass, and this artery was coil embolized.. Also a port was placed at the time of this procedure with a view to eventual chemotherapy.  He was discharged 01/26/2015,but required readmission 02/07/2015 for another presyncopal episode. He presented to the emergency room where he was found to have a hemoglobin of 7.5. The patient was transfusedIIA hemoglobin of 10 and was discharged 02/07/2014. He was scheduled to start CHOP right toxin 02/08/2014.  His subsequent history is as detailed below.   PAST MEDICAL HISTORY: Past Medical History:  Diagnosis Date  . Anemia   . Cancer ([place])    Lymphoma  . Essential hypertension   . GERD (gastroesophageal reflux disease)   . GI bleed   . HLD (hyperlipidemia)   . Weakness     PAST SURGICAL HISTORY: Past Surgical History:  Procedure Laterality Date  . ESOPHAGOGASTRODUODENOSCOPY Left 01/18/2015   Procedure: ESOPHAGOGASTRODUODENOSCOPY (EGD);  Surgeon: Wilford Corner, MD;  Location: [facility] ENDOSCOPY;  Service: Endoscopy;  Laterality:  Left;  . INGUINAL HERNIA REPAIR Right 01/22/2015   Procedure: RIGHT INGUINAL LYMPH NODE BX;  Surgeon: Donnie Mesa, MD;  Location: [facility];  Service: General;  Laterality: Right;  . MEDIASTINOSCOPY N/A 05/05/2017   Procedure: MEDIASTINOSCOPY;  Surgeon: Grace Isaac, MD;  Location: [place] [place];  Service: Thoracic;  Laterality: N/A;  . PORTACATH PLACEMENT  01/26/2015    power port with tip SVC/RA [place]  . SKIN SURGERY     Small benign cysts over left scalp removed  . VIDEO BRONCHOSCOPY WITH ENDOBRONCHIAL ULTRASOUND N/A 05/05/2017   Procedure: VIDEO BRONCHOSCOPY WITH ENDOBRONCHIAL ULTRASOUND;  Surgeon: Grace Isaac, MD;  Location: [facility] OR;  Service: Thoracic;  Laterality: N/A;    FAMILY HISTORY Family History  Problem Relation Age of Onset  . Hypertension Mother   . Hypertension Father   . Kidney failure Father   . Hypertension Sister   . Diabetes Sister   . Prostate cancer Brother   . Lupus Sister    The patient's father died from CHF complications age 93. The patient's mother is 7 y/o as of December 2016. The patient has 9 brothers, 8 sisters. One brother has prostate cancer. There is no other cancer history in the family to his knowledge.   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 His wife of 42+ years, [name], is disabled due to RA. Daughter, [name] works as a Teacher, early years/pre for [employer] (she previously worked at the [employer] in records). Son, [name] owns a window washing business. Son, [name] is in Press photographer at [employer]. All live in [place]. The patient  has 3 grandchildren. He attends a [employer].    ADVANCED DIRECTIVES: Not in place   HEALTH MAINTENANCE: Social History   Tobacco Use  . Smoking status: Never Smoker  . Smokeless tobacco: Never Used  Substance Use Topics  . Alcohol use: Yes    Comment: occasional beer  . Drug use: No    Colonoscopy:  PSA: 2.27 on 01/22/2015  Hepatitis serologies  (B surface antigen, hepatitis B core antibody and hepatitis C IgM) negative 01/18/2015  No Known Allergies  Current Outpatient Medications  Medication Sig Dispense Refill  . Ibrutinib 560 MG TABS Take 560 mg by mouth every morning. 84 tablet 3  . lisinopril-hydrochlorothiazide (PRINZIDE,ZESTORETIC) 20-25 MG tablet TAKE 1 TABLET BY MOUTH EVERY DAY 90 tablet 0  . lovastatin (MEVACOR) 20 MG tablet TAKE 1 TABLET BY MOUTH EVERYDAY AT BEDTIME (Patient taking differently: Take 20 mg by mouth at bedtime. TAKE 1 TABLET BY MOUTH EVERYDAY AT BEDTIME) 90 tablet 1   No current facility-administered medications for this visit.     OBJECTIVE: Middle-aged African-American man in no acute distress  Vitals:   03/16/18 0915  BP: (!) 141/91  Pulse: 79  Resp: 20  Temp: 98.1 F (36.7 C)  SpO2: 99%     Body mass index is 30.28 kg/m.    ECOG FS:0 - Asymptomatic  Sclerae unicteric, pupils round and equal Oropharynx clear and moist No cervical or supraclavicular adenopathy, no axillary or inguinal adenopathy Lungs no rales or rhonchi Heart regular rate and rhythm Abd soft, nontender, positive bowel sounds MSK no focal spinal tenderness, no upper extremity lymphedema Neuro: nonfocal, well oriented, appropriate affect   LAB RESULTS:  CMP     Component Value Date/Time   NA 141 03/08/2018 0734   NA 140 01/16/2017 0837   K 3.5 03/08/2018 0734   K 3.9 01/16/2017 0837   CL 104 03/08/2018 0734   CO2 27 03/08/2018 0734   CO2 24 01/16/2017 0837   GLUCOSE 86 03/08/2018 0734   GLUCOSE 102 01/16/2017 0837   BUN 19 03/08/2018 0734   BUN 20.4 01/16/2017 0837   CREATININE 1.16 03/08/2018 0734   CREATININE 1.2 01/16/2017 0837   CALCIUM 8.9 03/08/2018 0734   CALCIUM 8.9 01/16/2017 0837   PROT 6.9 03/08/2018 0734   PROT 6.9 01/16/2017 0837   ALBUMIN 4.1 03/08/2018 0734   ALBUMIN 4.0 01/16/2017 0837   AST 41 03/08/2018 0734   AST 20 01/16/2017 0837   ALT 20 03/08/2018 0734   ALT 22 01/16/2017 0837     ALKPHOS 37 (L) 03/08/2018 0734   ALKPHOS 47 01/16/2017 0837   BILITOT 0.6 03/08/2018 0734   BILITOT 0.36 01/16/2017 0837   GFRNONAA >60 03/08/2018 0734   GFRAA >60 03/08/2018 0734    INo results found for: SPEP, UPEP  Lab Results  Component Value Date   WBC 5.2 03/08/2018   NEUTROABS 3.0 03/08/2018   HGB 13.2 03/08/2018   HCT 41.1 03/08/2018   MCV 86.3 03/08/2018   PLT 307 03/08/2018      Chemistry      Component Value Date/Time   NA 141 03/08/2018 0734   NA 140 01/16/2017 0837   K 3.5 03/08/2018 0734   K 3.9 01/16/2017 0837   CL 104 03/08/2018 0734   CO2 27 03/08/2018 0734   CO2 24 01/16/2017 0837   BUN 19 03/08/2018 0734   BUN 20.4 01/16/2017 0837   CREATININE 1.16 03/08/2018 0734   CREATININE 1.2 01/16/2017 5361  Component Value Date/Time   CALCIUM 8.9 03/08/2018 0734   CALCIUM 8.9 01/16/2017 0837   ALKPHOS 37 (L) 03/08/2018 0734   ALKPHOS 47 01/16/2017 0837   AST 41 03/08/2018 0734   AST 20 01/16/2017 0837   ALT 20 03/08/2018 0734   ALT 22 01/16/2017 0837   BILITOT 0.6 03/08/2018 0734   BILITOT 0.36 01/16/2017 0837       No results found for: LABCA2  No components found for: [id number]  No results for input(s): INR in the last 168 hours.  Urinalysis    Component Value Date/Time   COLORURINE YELLOW 01/23/2015 0805   APPEARANCEUR CLEAR 01/23/2015 0805   LABSPEC 1.014 01/23/2015 0805   PHURINE 7.5 01/23/2015 0805   GLUCOSEU NEGATIVE 01/23/2015 0805   HGBUR NEGATIVE 01/23/2015 0805   BILIRUBINUR NEGATIVE 01/23/2015 0805   KETONESUR NEGATIVE 01/23/2015 0805   PROTEINUR NEGATIVE 01/23/2015 0805   NITRITE NEGATIVE 01/23/2015 0805   LEUKOCYTESUR NEGATIVE 01/23/2015 0805   Beta-2 microglobulin is normal.  LDH is slightly elevated   STUDIES: No results found.    ASSESSMENT: 68 y.o. [place] man with a diagnosis of mantle cell non-Hodgkin's lymphoma of extranodal and solid organ sites presenting with syncope secondary to bleeding from a  large ulcerated duodenal ulcer  (a) s/p coil embolization of the feeding (gastro-duodenal) artery 01/23/2015  (b) anemia--scant iron stores on bone marrow biopsy--s/p feraheme 02/13/2015  (1) right inguinal lymph node biopsy 01/22/2015 confirms mantle cell lymphoma  (a) bone marrow biopsy 02/06/2015 positive for involvement by the patient's mantle cell lymphoma  (b) IPI score of 5 (high risk) predicts a 5 year progression free survival of 50% with CHOP-Rituxan chemotherapy  (c) MIPI score of 5 (intermediate risk) predicts a median survival of 58 months  (2) CHOP/Rituxan started 02/09/2015, completed 8 cycles 07/05/2015  (3)  [facility] consultation 04/06/2015. Patient opted against transplant consolidation  (4) rituximab maintenance started 08/16/2015, repeat every 2 months, discontinued February 2019  (a) PET scan had shown a suggestion of recurrence, but biopsy was negative  (5) PSA increase noted July 2017, back to baseline by December 2017  (6) restaging PET scan 11/17/2016 is consistent with disease progression  (a) repeat PET scan 02/02/2017 confirms increase in lymph nodes  (b) PET scan 04/20/2017 shows increasing hypermetabolic adenopathy  (c) bronchoscopy/mediastinoscopy 05/05/2017 shows granulomatous disease, with negative AFB/ GMS stains; <10% of lymphocytes are B-cell with slight lambda excess  (d) PET scan on 07/06/2017 shows mild increase in mediastinal lymphadenopathy. Increased hypermetabolic supraclavicular lymph node.  (e) PET scan 12/24/2017 shows evidence of partial response   (7) ibrutinib 420 mg daily started 02/09/2017, discontinued March 2019  (a) resumed April 2019 at 560 mg daily  (8) granulomatous inflammation noted on pathology from 05/05/2017 mediastinoscopy  (a) fungal bacterial and acid-fast studies negative   (b) consider sarcoid   PLAN: Will has been on ibrutinib now for a year, with a slight interruption and a dose increase.  He is tolerating it moderately  well, although it is causing fatigue.  Clinically his mantle cell lymphoma continues to be well controlled.  We do need to obtain a PET scan at some point to confirm that and that is being scheduled for early August.  If there is evidence of disease progression we will have to consider a change in treatment, possibly change to venetoclax.  I do not understand why he is having difficulty with disability I was glad to write him a letter today explaining that his cancer is not  curable and that his treatment is debilitating.  Hopefully that will be helpful to him  Otherwise he knows to call for any issues that may develop before the next visit here.   , Virgie Dad, MD  03/16/18 9:49 AM Medical Oncology and Hematology [facility] [address] [place], [place] [address] Tel. [phone]    Fax. [phone]   I, Jacqualyn Posey am acting as a Education administrator for Chauncey Cruel, MD.   I, Lurline Del MD, have reviewed the above documentation for accuracy and completeness, and I agree with the above.

## 2018-03-15 MED FILL — IMBRUVICA 560 MG TAB: 560 | 28 days supply | Qty: 28 | Fill #9

## 2018-03-16 ENCOUNTER — Encounter: Payer: Self-pay | Admitting: Oncology

## 2018-03-16 ENCOUNTER — Telehealth: Payer: Self-pay | Admitting: Oncology

## 2018-03-16 ENCOUNTER — Inpatient Hospital Stay (HOSPITAL_BASED_OUTPATIENT_CLINIC_OR_DEPARTMENT_OTHER): Payer: Medicare HMO | Admitting: Oncology

## 2018-03-16 VITALS — BP 141/91 | HR 79 | Temp 98.1°F | Resp 20 | Ht 70.0 in | Wt 211.0 lb

## 2018-03-16 DIAGNOSIS — D649 Anemia, unspecified: Secondary | ICD-10-CM

## 2018-03-16 DIAGNOSIS — Z79899 Other long term (current) drug therapy: Secondary | ICD-10-CM | POA: Diagnosis not present

## 2018-03-16 DIAGNOSIS — K219 Gastro-esophageal reflux disease without esophagitis: Secondary | ICD-10-CM | POA: Diagnosis not present

## 2018-03-16 DIAGNOSIS — I10 Essential (primary) hypertension: Secondary | ICD-10-CM | POA: Diagnosis not present

## 2018-03-16 DIAGNOSIS — E785 Hyperlipidemia, unspecified: Secondary | ICD-10-CM | POA: Diagnosis not present

## 2018-03-16 DIAGNOSIS — C8319 Mantle cell lymphoma, extranodal and solid organ sites: Secondary | ICD-10-CM

## 2018-03-16 NOTE — Telephone Encounter (Signed)
Gave avs and calendar ° °

## 2018-04-15 MED FILL — IMBRUVICA 560 MG TAB: 560 | 28 days supply | Qty: 28 | Fill #10

## 2018-05-13 MED FILL — IMBRUVICA 560 MG TAB: 560 | 28 days supply | Qty: 28 | Fill #11

## 2018-05-20 ENCOUNTER — Other Ambulatory Visit: Payer: Self-pay | Admitting: Hematology and Oncology

## 2018-06-04 ENCOUNTER — Other Ambulatory Visit: Payer: Self-pay | Admitting: Oncology

## 2018-06-05 ENCOUNTER — Other Ambulatory Visit: Payer: Self-pay | Admitting: Oncology

## 2018-06-05 DIAGNOSIS — E785 Hyperlipidemia, unspecified: Secondary | ICD-10-CM

## 2018-06-10 MED FILL — IMBRUVICA 560 MG TAB: 560 | 28 days supply | Qty: 28 | Fill #0

## 2018-06-14 ENCOUNTER — Inpatient Hospital Stay: Payer: Medicare HMO | Attending: Hematology and Oncology

## 2018-06-14 ENCOUNTER — Other Ambulatory Visit: Payer: Self-pay

## 2018-06-14 DIAGNOSIS — C8319 Mantle cell lymphoma, extranodal and solid organ sites: Secondary | ICD-10-CM | POA: Diagnosis not present

## 2018-06-14 DIAGNOSIS — I10 Essential (primary) hypertension: Secondary | ICD-10-CM | POA: Insufficient documentation

## 2018-06-14 DIAGNOSIS — Z79899 Other long term (current) drug therapy: Secondary | ICD-10-CM | POA: Insufficient documentation

## 2018-06-14 DIAGNOSIS — D649 Anemia, unspecified: Secondary | ICD-10-CM | POA: Diagnosis not present

## 2018-06-14 DIAGNOSIS — K219 Gastro-esophageal reflux disease without esophagitis: Secondary | ICD-10-CM | POA: Insufficient documentation

## 2018-06-14 DIAGNOSIS — E785 Hyperlipidemia, unspecified: Secondary | ICD-10-CM | POA: Diagnosis not present

## 2018-06-14 LAB — COMPREHENSIVE METABOLIC PANEL
ALT: 15 U/L (ref 0–44)
AST: 14 U/L — ABNORMAL LOW (ref 15–41)
Albumin: 4.1 g/dL (ref 3.5–5.0)
Alkaline Phosphatase: 37 U/L — ABNORMAL LOW (ref 38–126)
Anion gap: 11 (ref 5–15)
BUN: 20 mg/dL (ref 8–23)
CO2: 27 mmol/L (ref 22–32)
Calcium: 8.9 mg/dL (ref 8.9–10.3)
Chloride: 101 mmol/L (ref 98–111)
Creatinine, Ser: 1.25 mg/dL — ABNORMAL HIGH (ref 0.61–1.24)
GFR calc Af Amer: >60 mL/min (ref >60)
GFR calc non Af Amer: >60 mL/min (ref >60)
Glucose, Bld: 99 mg/dL (ref 70–99)
Potassium: 4 mmol/L (ref 3.5–5.1)
Sodium: 139 mmol/L (ref 135–145)
Total Bilirubin: 0.5 mg/dL (ref 0.3–1.2)
Total Protein: 7 g/dL (ref 6.5–8.1)

## 2018-06-14 LAB — CBC WITH DIFFERENTIAL/PLATELET
Abs Immature Granulocytes: 0.05 10*3/uL (ref 0.00–0.07)
Basophils Absolute: 0 10*3/uL (ref 0.0–0.1)
Basophils Relative: 1 %
Eosinophils Absolute: 0.1 10*3/uL (ref 0.0–0.5)
Eosinophils Relative: 1 %
HCT: 39.7 % (ref 39.0–52.0)
Hemoglobin: 12.7 g/dL — ABNORMAL LOW (ref 13.0–17.0)
Immature Granulocytes: 1 %
Lymphocytes Relative: 22 %
Lymphs Abs: 1 10*3/uL (ref 0.7–4.0)
MCH: 27.8 pg (ref 26.0–34.0)
MCHC: 32 g/dL (ref 30.0–36.0)
MCV: 86.9 fL (ref 80.0–100.0)
Monocytes Absolute: 0.8 10*3/uL (ref 0.1–1.0)
Monocytes Relative: 18 %
Neutro Abs: 2.7 10*3/uL (ref 1.7–7.7)
Neutrophils Relative %: 57 %
Platelets: 271 10*3/uL (ref 150–400)
RBC: 4.57 MIL/uL (ref 4.22–5.81)
RDW: 14 % (ref 11.5–15.5)
WBC: 4.6 10*3/uL (ref 4.0–10.5)
nRBC: 0 % (ref 0.0–0.2)

## 2018-06-14 LAB — LACTATE DEHYDROGENASE: LDH: 235 U/L — ABNORMAL HIGH (ref 98–192)

## 2018-06-15 LAB — BETA 2 MICROGLOBULIN, SERUM: Beta-2 Microglobulin: 1.6 mg/L (ref 0.6–2.4)

## 2018-06-18 NOTE — Progress Notes (Signed)
[facility]  [phone] [phone]    ID: [name] DOB: 08-15-1950  MR#: [id number]  WPY#:[id number]  Patient Care Team: Shelda Pal, DO as PCP - General (Family Medicine) Jarryd Gratz, Virgie Dad, MD as Consulting Physician (Oncology) Donnie Mesa, MD as Consulting Physician (General Surgery) Wilford Corner, MD as Consulting Physician (Gastroenterology) Ples Specter, MD as Referring Physician (Internal Medicine) Larina Earthly, MD as Referring Physician (Hematology and Oncology) Grace Isaac, MD as Consulting Physician (Cardiothoracic Surgery) OTHER MD: Napoleon Form MD; Dr Glennon Mac [phone]   CHIEF COMPLAINT: 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   He continues on Ibrutinib. He denies diarrhea and any rashes. He is receiving the drug at no cost at this point.  Since his last visit, he has not undergone any additional studies. His most recent PET scan occurred on 12/24/2017. An order for repeat PET scan is in place but has not yet been scheduled.   REVIEW OF SYSTEMS: [name] reports doing well overall. He states he lifts weights for exercise; he is disappointed that he is unable to run like he used to due to his knees. He reports an episode of sitting up too fast in which his vision went black. He notes his daughter is working from home but [name] be going back to the office soon. So his granddaughter [name] be going back to daycare because of this. He is nervous about this decision.  The patient denies typical 'B' symptoms of fever, diaphoresis, weight loss, or fatigue. The patient denies unusual headaches, visual changes, nausea, vomiting, stiff neck, dizziness, or gait imbalance. There has been no cough, phlegm production, or pleurisy, no chest  pain or pressure, and no change in bowel or bladder habits. The patient denies fever, rash, bleeding, unexplained fatigue or unexplained weight loss. A detailed review of systems was otherwise entirely negative.   HISTORY OF PRESENT ILLNESS: From the original intake note:   "[name]" was in his usual good health until early December 2016, when he had a brief syncopal episode. He "blew this off" and it did not happen again until 01/17/2015 when he felt weak at work and later fainted at home. He was brought to the ED where he was found to be guaiac positive (denies melena before that date) with a Hb of 4.5 MCV 82.2, platelets 295K, WBC 9.1 and normal INR and PTT. He was admitted and underwent EGD under Dr Michail Sermon 01/18/2015 showing a very large duodenal ulcer w clot. Abdominal US 01/19/2015 was not very informative but CT abd/pelvis 12/17,2016 showed adenopathy in the anterior mediastinum, retroperitoneum and pelvis, with a large ulcerated mass involving the distal stomach/proximal duodenum and extending into the gastrohepatic region.   On 01/22/2015 he underwent right inguinal lymph node biopsy, consistent with mantle cell lymphoma (([id number] and [id number])). Specifically the lymph node showed effacement of the architecture by sheets of small to medium sized lymphocytes with no apparent nodularity. The cells were positive for CD20, CD5, BCL-2, CD21, and cyclin D1. He 67 was low (10-20%). CD21 revealed a few residual germinal centers area at the B cells were negative for CD10, and BCL-6. CD23 was negative.  On 02/06/2015 the patient underwent a bone marrow aspirate and biopsy. The pathology from this procedure (FZB 17-2 and 17-7) was positive for involvement by non-Hodgkin's lymphoma with numerous atypical  interstitial and paratrabecular lymphoid aggregates consistent with mantle cell lymphoma. Iron stain showed scant iron flow cytometry confirmed a monoclonal B-cell population, lambda restricted,  consistent with the patient's known diagnosis of mantle cell lymphoma.  On 01/18/2015 the patient underwent EGD which found a massive duodenal bulb ulcer with adherent clot, although no active bleeding.his hemoglobin was in the 8.4 range and stable. He was discharged on 01/22/2015, but within 24 hours was readmitted with syncope and altered mental status. He had aspirated and required intubation for his respiratory failure. Hemoglobin was down to 6.3. An NG tube was placed, the patient was multiply transfused, and Surgery was considered but felt not to be feasible.accordingly the patient underwent arteriography 01/23/2015 which found the superior mesenteric and celiac arteries not to be the involved vessels. The gastroduodenal artery showed multiple branches providing supply to the hypervascular duodenal mass, and this artery was coil embolized.. Also a port was placed at the time of this procedure with a view to eventual chemotherapy.  He was discharged 01/26/2015,but required readmission 02/07/2015 for another presyncopal episode. He presented to the emergency room where he was found to have a hemoglobin of 7.5. The patient was transfusedIIA hemoglobin of 10 and was discharged 02/07/2014. He was scheduled to start CHOP right toxin 02/08/2014.  His subsequent history is as detailed below.   PAST MEDICAL HISTORY: Past Medical History:  Diagnosis Date  . Anemia   . Cancer ([place])    Lymphoma  . Essential hypertension   . GERD (gastroesophageal reflux disease)   . GI bleed   . HLD (hyperlipidemia)   . Weakness     PAST SURGICAL HISTORY: Past Surgical History:  Procedure Laterality Date  . ESOPHAGOGASTRODUODENOSCOPY Left 01/18/2015   Procedure: ESOPHAGOGASTRODUODENOSCOPY (EGD);  Surgeon: Wilford Corner, MD;  Location: [facility] ENDOSCOPY;  Service: Endoscopy;  Laterality: Left;  . INGUINAL HERNIA REPAIR Right 01/22/2015   Procedure: RIGHT INGUINAL LYMPH NODE BX;  Surgeon: Donnie Mesa, MD;   Location: [facility];  Service: General;  Laterality: Right;  . MEDIASTINOSCOPY N/A 05/05/2017   Procedure: MEDIASTINOSCOPY;  Surgeon: Grace Isaac, MD;  Location: [facility];  Service: Thoracic;  Laterality: N/A;  . PORTACATH PLACEMENT  01/26/2015    power port with tip SVC/RA [place]  . SKIN SURGERY     Small benign cysts over left scalp removed  . VIDEO BRONCHOSCOPY WITH ENDOBRONCHIAL ULTRASOUND N/A 05/05/2017   Procedure: VIDEO BRONCHOSCOPY WITH ENDOBRONCHIAL ULTRASOUND;  Surgeon: Grace Isaac, MD;  Location: [facility] OR;  Service: Thoracic;  Laterality: N/A;    FAMILY HISTORY Family History  Problem Relation Age of Onset  . Hypertension Mother   . Hypertension Father   . Kidney failure Father   . Hypertension Sister   . Diabetes Sister   . Prostate cancer Brother   . Lupus Sister    The patient's father died from CHF complications age 57. The patient's mother is 68 y/o as of December 2016. The patient has 9 brothers, 8 sisters. One brother has prostate cancer. There is no other cancer history in the family to his knowledge.   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 His wife of 42+ years, [name], is disabled due to RA. Daughter, [name] works as a Teacher, early years/pre for [employer] (she previously worked at the [employer] in records). Son, [name] owns a window washing business. Son, [name] is in Press photographer at [employer]. All live in [place]. The patient has  3 grandchildren. He attends a [employer].    ADVANCED DIRECTIVES: Wife, [name], is automatically his HCPOA   HEALTH MAINTENANCE: Social History   Tobacco Use  . Smoking status: Never Smoker  . Smokeless tobacco: Never Used  Substance Use Topics  . Alcohol use: Yes    Comment: occasional beer  . Drug use: No    Colonoscopy:  PSA: 2.27 on 01/22/2015  Hepatitis serologies (B surface antigen, hepatitis B core antibody and hepatitis C IgM) negative 01/18/2015  No Known  Allergies  Current Outpatient Medications  Medication Sig Dispense Refill  . IMBRUVICA 560 MG TABS TAKE 1 TABLET BY MOUTH EVERY MORNING. 84 tablet 3  . lisinopril-hydrochlorothiazide (PRINZIDE,ZESTORETIC) 20-25 MG tablet TAKE 1 TABLET BY MOUTH EVERY DAY 90 tablet 0  . lovastatin (MEVACOR) 20 MG tablet TAKE 1 TABLET BY MOUTH EVERYDAY AT BEDTIME 90 tablet 1   No current facility-administered medications for this visit.     OBJECTIVE: Middle-aged African-American Taylor who appears well  Vitals:   06/21/18 1117  BP: 134/68  Pulse: 77  Resp: 18  Temp: 98 F (36.7 C)  SpO2: 100%     Body mass index is 30.12 kg/m.    ECOG FS:1 - Symptomatic but completely ambulatory   Sclerae unicteric, EOMs intact Wearing a mask No cervical or supraclavicular adenopathy, no axillary or inguinal adenopathy Lungs no rales or rhonchi Heart regular rate and rhythm Abd soft, nontender, positive bowel sounds, no palpable splenomegaly MSK no focal spinal tenderness Neuro: nonfocal, well oriented, appropriate affect  LAB RESULTS:  CMP     Component Value Date/Time   NA 139 06/14/2018 0741   NA 140 01/16/2017 0837   K 4.0 06/14/2018 0741   K 3.9 01/16/2017 0837   CL 101 06/14/2018 0741   CO2 27 06/14/2018 0741   CO2 24 01/16/2017 0837   GLUCOSE 99 06/14/2018 0741   GLUCOSE 102 01/16/2017 0837   BUN 20 06/14/2018 0741   BUN 20.4 01/16/2017 0837   CREATININE 1.25 (H) 06/14/2018 0741   CREATININE 1.2 01/16/2017 0837   CALCIUM 8.9 06/14/2018 0741   CALCIUM 8.9 01/16/2017 0837   PROT 7.0 06/14/2018 0741   PROT 6.9 01/16/2017 0837   ALBUMIN 4.1 06/14/2018 0741   ALBUMIN 4.0 01/16/2017 0837   AST 14 (L) 06/14/2018 0741   AST 20 01/16/2017 0837   ALT 15 06/14/2018 0741   ALT 22 01/16/2017 0837   ALKPHOS 37 (L) 06/14/2018 0741   ALKPHOS 47 01/16/2017 0837   BILITOT 0.5 06/14/2018 0741   BILITOT 0.36 01/16/2017 0837   GFRNONAA >60 06/14/2018 0741   GFRAA >60 06/14/2018 0741    INo results  found for: SPEP, UPEP  Lab Results  Component Value Date   WBC 4.6 06/14/2018   NEUTROABS 2.7 06/14/2018   HGB 12.7 (L) 06/14/2018   HCT 39.7 06/14/2018   MCV 86.9 06/14/2018   PLT 271 06/14/2018      Chemistry      Component Value Date/Time   NA 139 06/14/2018 0741   NA 140 01/16/2017 0837   K 4.0 06/14/2018 0741   K 3.9 01/16/2017 0837   CL 101 06/14/2018 0741   CO2 27 06/14/2018 0741   CO2 24 01/16/2017 0837   BUN 20 06/14/2018 0741   BUN 20.4 01/16/2017 0837   CREATININE 1.25 (H) 06/14/2018 0741   CREATININE 1.2 01/16/2017 0837      Component Value Date/Time   CALCIUM 8.9 06/14/2018 0741   CALCIUM 8.9  01/16/2017 0837   ALKPHOS 37 (L) 06/14/2018 0741   ALKPHOS 47 01/16/2017 0837   AST 14 (L) 06/14/2018 0741   AST 20 01/16/2017 0837   ALT 15 06/14/2018 0741   ALT 22 01/16/2017 0837   BILITOT 0.5 06/14/2018 0741   BILITOT 0.36 01/16/2017 0837       No results found for: LABCA2  No components found for: [id number]  No results for input(s): INR in the last 168 hours.  Urinalysis    Component Value Date/Time   COLORURINE YELLOW 01/23/2015 0805   APPEARANCEUR CLEAR 01/23/2015 0805   LABSPEC 1.014 01/23/2015 0805   PHURINE 7.5 01/23/2015 0805   GLUCOSEU NEGATIVE 01/23/2015 0805   HGBUR NEGATIVE 01/23/2015 0805   BILIRUBINUR NEGATIVE 01/23/2015 0805   KETONESUR NEGATIVE 01/23/2015 0805   PROTEINUR NEGATIVE 01/23/2015 0805   NITRITE NEGATIVE 01/23/2015 0805   LEUKOCYTESUR NEGATIVE 01/23/2015 0805   Beta-2 microglobulin is normal.  LDH is slightly elevated   STUDIES: No results found.    ASSESSMENT: 68 y.o. [name] with a diagnosis of mantle cell non-Hodgkin's lymphoma of extranodal and solid organ sites presenting with syncope secondary to bleeding from a large ulcerated duodenal ulcer  (a) s/p coil embolization of the feeding (gastro-duodenal) artery 01/23/2015  (b) anemia--scant iron stores on bone marrow biopsy--s/p feraheme 02/13/2015  (1)  right inguinal lymph node biopsy 01/22/2015 confirms mantle cell lymphoma  (a) bone marrow biopsy 02/06/2015 positive for involvement by the patient's mantle cell lymphoma  (b) IPI score of 5 (high risk) predicts a 5 year progression free survival of 50% with CHOP-Rituxan chemotherapy  (c) MIPI score of 5 (intermediate risk) predicts a median survival of 58 months  (2) CHOP/Rituxan started 02/09/2015, completed 8 cycles 07/05/2015  (3)  [facility] consultation 04/06/2015. Patient opted against transplant consolidation  (4) rituximab maintenance started 08/16/2015, repeat every 2 months, discontinued February 2019  (a) PET scan had shown a suggestion of recurrence, but see (6) below  (5) PSA increase noted July 2017, back to baseline by December 2017  (6) restaging PET scan 11/17/2016 is consistent with disease progression  (a) repeat PET scan 02/02/2017 confirms increase in lymph nodes  (b) PET scan 04/20/2017 shows increasing hypermetabolic adenopathy  (c) bronchoscopy/mediastinoscopy 05/05/2017 shows granulomatous disease, noncaseating, with negative AFB/ GMS stains; <10% of lymphocytes are B-cell with slight lambda excess  (d) PET scan on 07/06/2017 shows mild increase in mediastinal lymphadenopathy. Increased hypermetabolic supraclavicular lymph node.  (e) PET scan 12/24/2017 interpreted as showing evidence of partial response   (7) ibrutinib 420 mg daily started 02/09/2017, discontinued March 2019  (a) resumed April 2019 at 560 mg daily  (8) granulomatous inflammation noted on pathology from 05/05/2017 mediastinoscopy  (a) fungal bacterial and acid-fast studies negative   (b) consider sarcoid   PLAN: [name] has been on his current dose of ibrutinib for just over a year.  There is no evidence of disease activity or progression by history review of systems or exam.  Specifically there have been no "B" symptoms.  He is tolerating the ibrutinib well with essentially no significant side  effects  He is LDH is minimally elevated, although actually lower than prior.  He has been 2 microglobulin is normal and he has excellent counts.  All this is very favorable.  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  Nevertheless we [name] repeat a PET scan in August and I  [name] see him again in September.  At this point I am delighted that he is doing so well.  I am concerned that his granddaughter, which he and his wife keep on Thursdays, is not going to daycare.  I think it is really not safe to have her because [name] his wife has significant rheumatoid arthritis other issues.  They [name] discuss this question.  Aside from that they are taking appropriate COVID precautions.  He knows to call for any other issues that may develop before his next visit here.   Chiyo Fay, Virgie Dad, MD  06/21/18 11:35 AM Medical Oncology and Hematology [facility] [address] [name], [place] [address] Tel. [phone]    Fax. [phone]    I, Wilburn Mylar, am acting as scribe for Dr. Virgie Dad. Gustavo Meditz.  I, Lurline Del MD, have reviewed the above documentation for accuracy and completeness, and I agree with the above.

## 2018-06-21 ENCOUNTER — Other Ambulatory Visit: Payer: Self-pay

## 2018-06-21 ENCOUNTER — Inpatient Hospital Stay (HOSPITAL_BASED_OUTPATIENT_CLINIC_OR_DEPARTMENT_OTHER): Payer: Medicare HMO | Admitting: Oncology

## 2018-06-21 VITALS — BP 134/68 | HR 77 | Temp 98.0°F | Resp 18 | Ht 70.0 in | Wt 209.9 lb

## 2018-06-21 DIAGNOSIS — Z79899 Other long term (current) drug therapy: Secondary | ICD-10-CM

## 2018-06-21 DIAGNOSIS — K219 Gastro-esophageal reflux disease without esophagitis: Secondary | ICD-10-CM | POA: Diagnosis not present

## 2018-06-21 DIAGNOSIS — I10 Essential (primary) hypertension: Secondary | ICD-10-CM | POA: Diagnosis not present

## 2018-06-21 DIAGNOSIS — D649 Anemia, unspecified: Secondary | ICD-10-CM | POA: Diagnosis not present

## 2018-06-21 DIAGNOSIS — C8319 Mantle cell lymphoma, extranodal and solid organ sites: Secondary | ICD-10-CM

## 2018-06-21 DIAGNOSIS — E785 Hyperlipidemia, unspecified: Secondary | ICD-10-CM | POA: Diagnosis not present

## 2018-06-23 ENCOUNTER — Telehealth: Payer: Self-pay | Admitting: Oncology

## 2018-06-23 NOTE — Telephone Encounter (Signed)
Called regarding schedule °

## 2018-07-08 MED FILL — IMBRUVICA 560 MG TAB: 560 | 28 days supply | Qty: 28 | Fill #1

## 2018-08-04 MED FILL — IMBRUVICA 560 MG TAB: 560 | 28 days supply | Qty: 28 | Fill #2

## 2018-08-13 ENCOUNTER — Other Ambulatory Visit: Payer: Self-pay | Admitting: Oncology

## 2018-09-02 MED FILL — IMBRUVICA 560 MG TAB: 560 | 28 days supply | Qty: 28 | Fill #3

## 2018-09-21 ENCOUNTER — Encounter (HOSPITAL_COMMUNITY)
Admission: RE | Admit: 2018-09-21 | Discharge: 2018-09-21 | Disposition: A | Discharge status: Home / Self Care | Payer: Medicare HMO | Source: Ambulatory Visit | Attending: Oncology | Admitting: Oncology

## 2018-09-21 ENCOUNTER — Other Ambulatory Visit: Payer: Self-pay

## 2018-09-21 DIAGNOSIS — I10 Essential (primary) hypertension: Secondary | ICD-10-CM | POA: Diagnosis not present

## 2018-09-21 DIAGNOSIS — E785 Hyperlipidemia, unspecified: Secondary | ICD-10-CM | POA: Diagnosis not present

## 2018-09-21 DIAGNOSIS — Z8249 Family history of ischemic heart disease and other diseases of the circulatory system: Secondary | ICD-10-CM | POA: Diagnosis not present

## 2018-09-21 DIAGNOSIS — C8319 Mantle cell lymphoma, extranodal and solid organ sites: Secondary | ICD-10-CM | POA: Diagnosis not present

## 2018-09-21 DIAGNOSIS — I700 Atherosclerosis of aorta: Secondary | ICD-10-CM | POA: Insufficient documentation

## 2018-09-21 DIAGNOSIS — Z79899 Other long term (current) drug therapy: Secondary | ICD-10-CM | POA: Insufficient documentation

## 2018-09-21 DIAGNOSIS — C8310 Mantle cell lymphoma, unspecified site: Secondary | ICD-10-CM | POA: Diagnosis not present

## 2018-09-21 DIAGNOSIS — K219 Gastro-esophageal reflux disease without esophagitis: Secondary | ICD-10-CM | POA: Insufficient documentation

## 2018-09-21 LAB — GLUCOSE, CAPILLARY: Glucose-Capillary: 93 mg/dL (ref 70–99)

## 2018-09-21 IMAGING — CT NUCLEAR MEDICINE PET IMAGE RESTAGING (PS) SKULL BASE TO THIGH
8 series · 16 of 16 positions shown · non-contrast
Comparison: [DATE] PET-CT.

CLINICAL DATA: Subsequent treatment strategy for mantle cell
lymphoma.

EXAM:
NUCLEAR MEDICINE PET SKULL BASE TO THIGH
TECHNIQUE: 10.4 mCi F-18 FDG was injected intravenously. Full-ring PET imaging
was performed from the skull base to thigh after the radiotracer. CT
data was obtained and used for attenuation correction and anatomic
localization.
Fasting blood glucose: 93 mg/dl

[Series 3: pet sk_thigh ac · axial · 5.0mm · 4.07mm/px · z∈[-857,+35]mm · 3 of 224 slices shown]
[im 1/224]
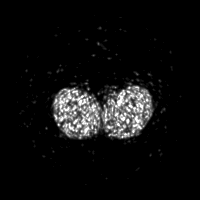
[im 112/224]
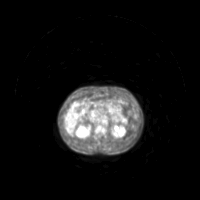
[im 224/224]
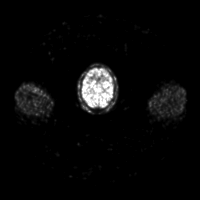

[Series 4: ct sk_thigh 5.0 b31f · axial · 0.98mm/px · z∈[-857,+35]mm · 3 of 224 slices shown]
[im 1/224  soft-tissue]
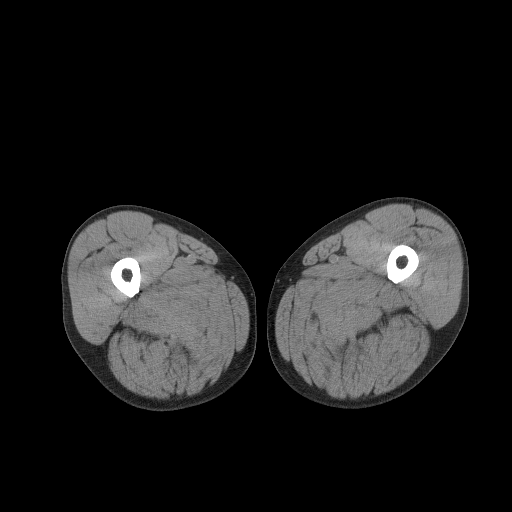
[im 112/224  soft-tissue]
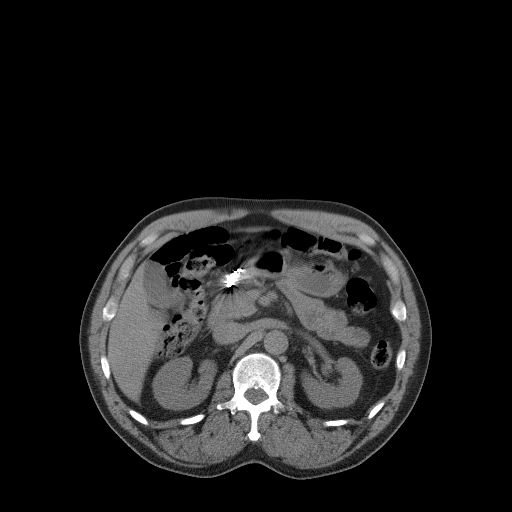
[im 224/224  soft-tissue]
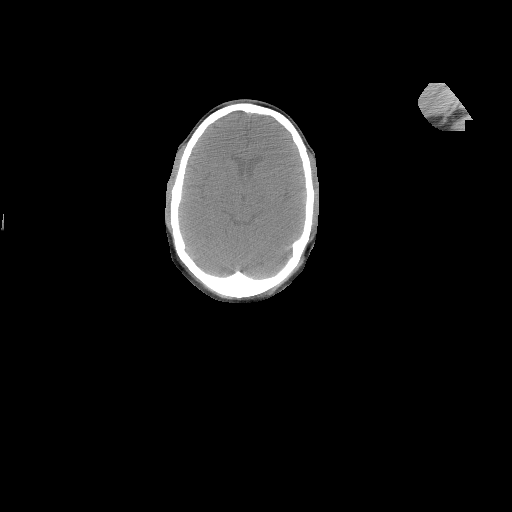

[Series 5: pet sk_thigh nac · axial · 5.0mm · 4.07mm/px · z∈[-857,+35]mm · 3 of 224 slices shown]
[im 1/224]
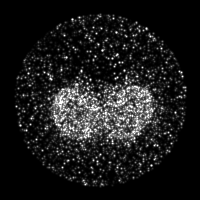
[im 112/224]
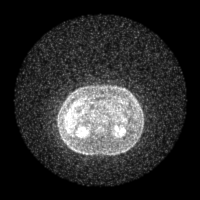
[im 224/224]
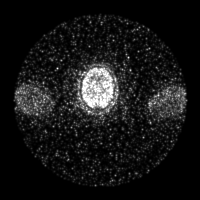

[Series 8: ct sk_thigh 5.0 (id) lung_bone · axial · 0.72mm/px · 1 of 66 slices shown]
[im 1/66  soft-tissue]
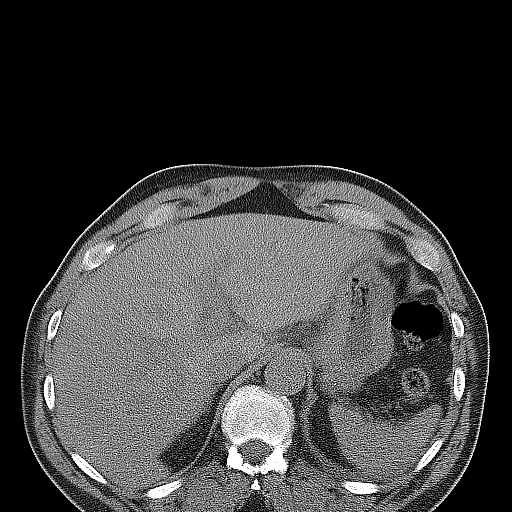

[Series 603: range-ct sk_thigh 5.0 (id)<alpha range> · 1 of 83 slices shown (1 of 2)]
[im 1/83]
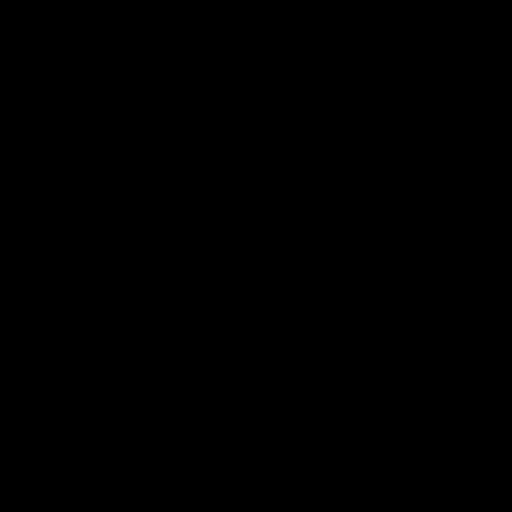

[Series 604: range-ct sk_thigh 5.0 (id)<alpha range> · 3 of 207 slices shown (2 of 2)]
[im 1/207]
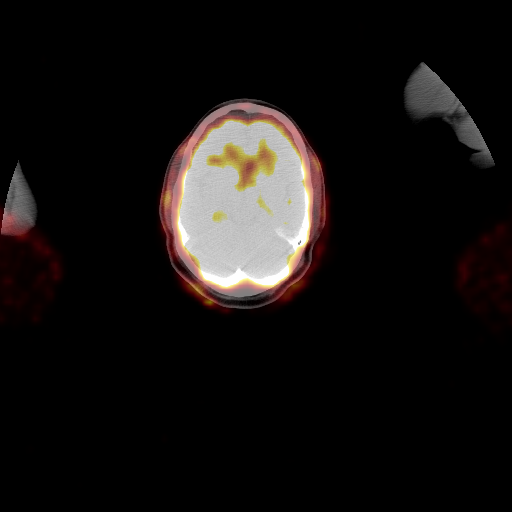
[im 104/207]
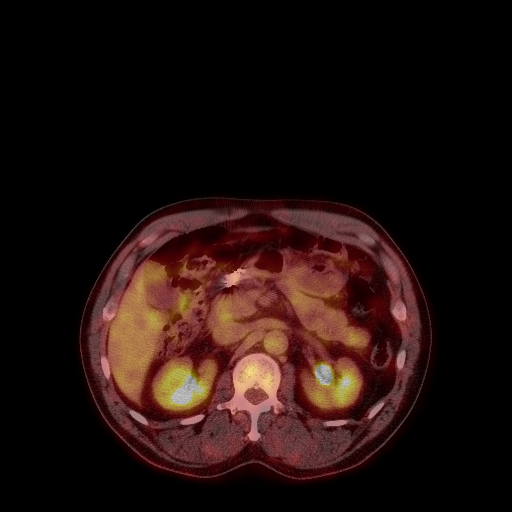
[im 207/207]
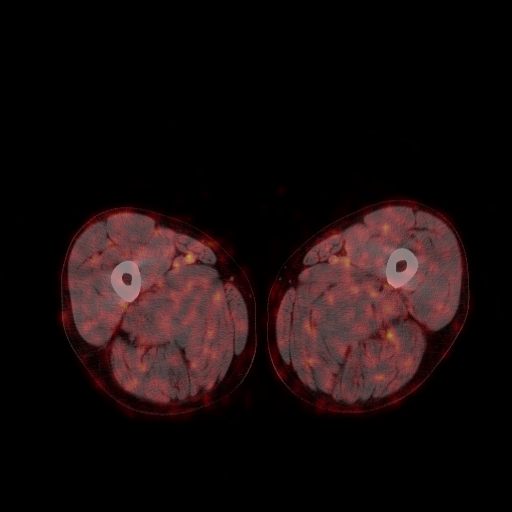

[Series 2430: results mm oncology reading · 3.0mm · 0.50mm/px · 1 of 4 slices shown (1 of 2)]
[im 1/4]
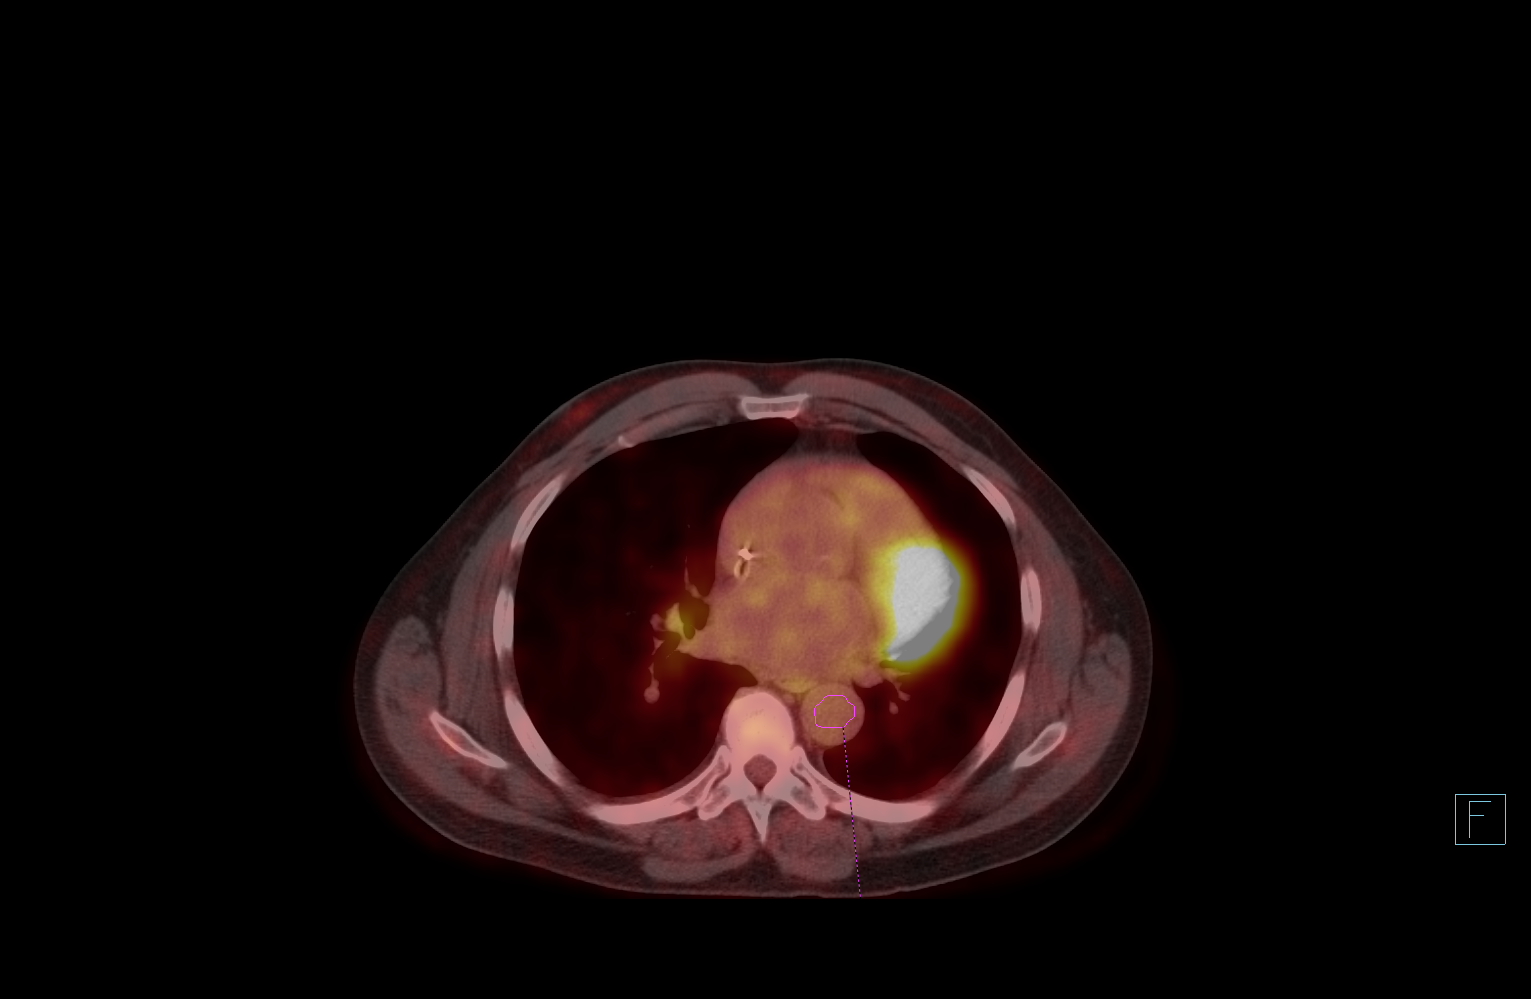

[Series 2910: results mm oncology reading · 1.0mm · 1.20mm/px · 1 of 1 slices shown (2 of 2)]
[im 1/1]
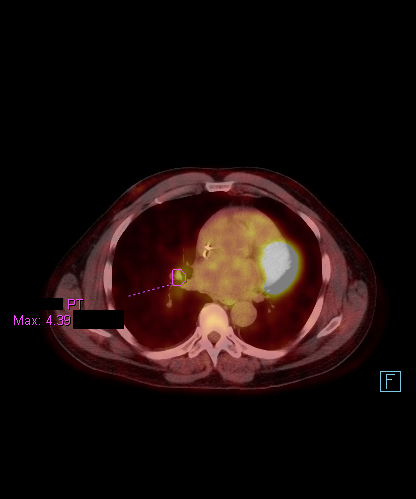

[16 of 16 positions shown; findings below may reference images not displayed]

FINDINGS: Mediastinal blood pool activity: SUV max

Liver activity: SUV max

NECK: No hypermetabolic lymph nodes in the neck.

Incidental CT findings: none

CHEST: Hypermetabolic right paratracheal lymph nodes are decreased
in size and metabolism. Representative 1.1 cm lower right
paratracheal node with max SUV 0.6 (series 4/image 62), previously
1.9 cm with max SUV 7.1.

Mildly enlarged hypermetabolic 1.0 cm right subcarinal node with max
SUV 5.8 (series 4/image 70), previously 1.6 cm with max SUV 7.1,
decreased in size and metabolism.

Hypermetabolic right hilar lymph node with max SUV 6.9, previous max
SUV 6.6, not appreciably changed.

No residual hypermetabolic left paratracheal/AP window or left hilar
lymph nodes. No enlarged or hypermetabolic axillary lymph nodes. No
hypermetabolic pulmonary findings.

Incidental CT findings: Right internal jugular Port-A-Cath
terminates at the cavoatrial junction. Coronary atherosclerosis.
Atherosclerotic nonaneurysmal thoracic aorta. No acute consolidative
airspace disease, lung masses or significant pulmonary nodules.

ABDOMEN/PELVIS: No abnormal hypermetabolic activity within the
liver, pancreas, adrenal glands, or spleen. No hypermetabolic lymph
nodes in the abdomen or pelvis.

Incidental CT findings: Embolization coils are again noted in the
central mesentery and pancreatic head region. Mildly enlarged
prostate. Atherosclerotic nonaneurysmal abdominal aorta.

SKELETON: No focal hypermetabolic activity to suggest skeletal
metastasis.

Incidental CT findings: none
IMPRESSION: 1. Interval partial treatment response. Hypermetabolic paratracheal,
subcarinal and left hilar lymph nodes have decreased in size and
metabolism. Hypermetabolic right hilar lymph node is stable in
metabolism. No new or progressive sites of metabolically active
lymphoma. [HOSPITAL] score 4.
2.  Aortic Atherosclerosis ([1F]-[1F]).

## 2018-09-21 MED ORDER — FLUDEOXYGLUCOSE F - 18 (FDG) INJECTION
10.4000 | Freq: Once | INTRAVENOUS | Status: AC | PRN
  Administered 2018-09-21: 07:00:00 10.4 via INTRAVENOUS

## 2018-09-30 MED FILL — IMBRUVICA 560 MG TAB: 560 | 28 days supply | Qty: 28 | Fill #4

## 2018-10-18 ENCOUNTER — Other Ambulatory Visit: Payer: Self-pay

## 2018-10-18 ENCOUNTER — Inpatient Hospital Stay: Payer: Medicare HMO | Attending: Oncology

## 2018-10-18 DIAGNOSIS — I10 Essential (primary) hypertension: Secondary | ICD-10-CM | POA: Insufficient documentation

## 2018-10-18 DIAGNOSIS — E785 Hyperlipidemia, unspecified: Secondary | ICD-10-CM | POA: Diagnosis not present

## 2018-10-18 DIAGNOSIS — Z79899 Other long term (current) drug therapy: Secondary | ICD-10-CM | POA: Insufficient documentation

## 2018-10-18 DIAGNOSIS — C8319 Mantle cell lymphoma, extranodal and solid organ sites: Secondary | ICD-10-CM

## 2018-10-18 DIAGNOSIS — C8315 Mantle cell lymphoma, lymph nodes of inguinal region and lower limb: Secondary | ICD-10-CM | POA: Insufficient documentation

## 2018-10-18 DIAGNOSIS — Z8042 Family history of malignant neoplasm of prostate: Secondary | ICD-10-CM | POA: Insufficient documentation

## 2018-10-18 LAB — COMPREHENSIVE METABOLIC PANEL
ALT: 11 U/L (ref 0–44)
AST: 15 U/L (ref 15–41)
Albumin: 4.3 g/dL (ref 3.5–5.0)
Alkaline Phosphatase: 36 U/L — ABNORMAL LOW (ref 38–126)
Anion gap: 8 (ref 5–15)
BUN: 22 mg/dL (ref 8–23)
CO2: 28 mmol/L (ref 22–32)
Calcium: 9.2 mg/dL (ref 8.9–10.3)
Chloride: 104 mmol/L (ref 98–111)
Creatinine, Ser: 1.25 mg/dL — ABNORMAL HIGH (ref 0.61–1.24)
GFR calc Af Amer: >60 mL/min (ref >60)
GFR calc non Af Amer: >60 mL/min (ref >60)
Glucose, Bld: 90 mg/dL (ref 70–99)
Potassium: 4.3 mmol/L (ref 3.5–5.1)
Sodium: 140 mmol/L (ref 135–145)
Total Bilirubin: 0.6 mg/dL (ref 0.3–1.2)
Total Protein: 6.9 g/dL (ref 6.5–8.1)

## 2018-10-18 LAB — CBC WITH DIFFERENTIAL/PLATELET
Abs Immature Granulocytes: 0.04 10*3/uL (ref 0.00–0.07)
Basophils Absolute: 0 10*3/uL (ref 0.0–0.1)
Basophils Relative: 1 %
Eosinophils Absolute: 0 10*3/uL (ref 0.0–0.5)
Eosinophils Relative: 1 %
HCT: 38.4 % — ABNORMAL LOW (ref 39.0–52.0)
Hemoglobin: 12.3 g/dL — ABNORMAL LOW (ref 13.0–17.0)
Immature Granulocytes: 1 %
Lymphocytes Relative: 22 %
Lymphs Abs: 1.1 10*3/uL (ref 0.7–4.0)
MCH: 27.9 pg (ref 26.0–34.0)
MCHC: 32 g/dL (ref 30.0–36.0)
MCV: 87.1 fL (ref 80.0–100.0)
Monocytes Absolute: 0.8 10*3/uL (ref 0.1–1.0)
Monocytes Relative: 16 %
Neutro Abs: 3.1 10*3/uL (ref 1.7–7.7)
Neutrophils Relative %: 59 %
Platelets: 291 10*3/uL (ref 150–400)
RBC: 4.41 MIL/uL (ref 4.22–5.81)
RDW: 14.1 % (ref 11.5–15.5)
WBC: 5.1 10*3/uL (ref 4.0–10.5)
nRBC: 0 % (ref 0.0–0.2)

## 2018-10-18 LAB — LACTATE DEHYDROGENASE: LDH: 236 U/L — ABNORMAL HIGH (ref 98–192)

## 2018-10-19 LAB — BETA 2 MICROGLOBULIN, SERUM: Beta-2 Microglobulin: 1.7 mg/L (ref 0.6–2.4)

## 2018-10-24 NOTE — Progress Notes (Signed)
[facility]  [phone] [phone]    ID: TEVAN MARIAN DOB: 1951-01-30  MR#: [id number]  TDS#:[id number]  Patient Care Team: Shelda Pal, DO as PCP - General (Family Medicine) Saburo Luger, Virgie Dad, MD as Consulting Physician (Oncology) Donnie Mesa, MD as Consulting Physician (General Surgery) Wilford Corner, MD as Consulting Physician (Gastroenterology) Ples Specter, MD as Referring Physician (Internal Medicine) Larina Earthly, MD as Referring Physician (Hematology and Oncology) Grace Isaac, MD as Consulting Physician (Cardiothoracic Surgery) OTHER MD: Napoleon Form MD; Dr Glennon Mac [phone]   CHIEF COMPLAINT: 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 He was last seen here on 06/21/2018.   He continues on ibrutinib.  He tolerates this with no side effects that he is aware of.  In particular he does not have unexplained bruising nausea or fatigue and he has had no palpitations  Since his last visit here, he underwent a PET scan on 09/21/2018 showing: Interval partial treatment response. Hypermetabolic paratracheal, subcarinal and left hilar lymph nodes have decreased in size and metabolism. Hypermetabolic right hilar lymph node is stable in metabolism. No new or progressive sites of metabolically active lymphoma. Deauville score 4. Aortic Atherosclerosis (ICD10-I70.0).   REVIEW OF SYSTEMS: [name] continues to exercise regularly.  He does weights and also a lot of cardio.  Now that he is retired he is driving all the family members to all their various activities.  There have been no fevers, rash, pruritus, weight loss, drenching sweats, or adenopathy.  He is being very careful regarding the current pandemic.  A detailed review of systems today was  otherwise stable   HISTORY OF PRESENT ILLNESS: From the original intake note:   "[name]" was in his usual good health until early December 2016, when he had a brief syncopal episode. He "blew this off" and it did not happen again until 01/17/2015 when he felt weak at work and later fainted at home. He was brought to the ED where he was found to be guaiac positive (denies melena before that date) with a Hb of 4.5 MCV 82.2, platelets 295K, WBC 9.1 and normal INR and PTT. He was admitted and underwent EGD under Dr Michail Sermon 01/18/2015 showing a very large duodenal ulcer w clot. Abdominal US 01/19/2015 was not very informative but CT abd/pelvis 12/17,2016 showed adenopathy in the anterior mediastinum, retroperitoneum and pelvis, with a large ulcerated mass involving the distal stomach/proximal duodenum and extending into the gastrohepatic region.   On 01/22/2015 he underwent right inguinal lymph node biopsy, consistent with mantle cell lymphoma (([id number] and [id number])). Specifically the lymph node showed effacement of the architecture by sheets of small to medium sized lymphocytes with no apparent nodularity. The cells were positive for CD20, CD5, BCL-2, CD21, and cyclin D1. He 66 was low (10-20%). CD21 revealed a few residual germinal centers area at the B cells were negative for CD10, and BCL-6. CD23 was negative.  On 02/06/2015 the patient underwent a bone marrow aspirate and biopsy. The pathology from this procedure (FZB 17-2 and 17-7) was positive for involvement by non-Hodgkin's lymphoma with numerous atypical interstitial and paratrabecular lymphoid aggregates consistent with mantle cell lymphoma. Iron stain showed scant iron flow cytometry confirmed a monoclonal B-cell population, lambda restricted, consistent with the patient's known diagnosis of mantle cell lymphoma.  On 01/18/2015 the patient underwent EGD which  found a massive duodenal bulb ulcer with adherent clot, although no active  bleeding.his hemoglobin was in the 8.4 range and stable. He was discharged on 01/22/2015, but within 24 hours was readmitted with syncope and altered mental status. He had aspirated and required intubation for his respiratory failure. Hemoglobin was down to 6.3. An NG tube was placed, the patient was multiply transfused, and Surgery was considered but felt not to be feasible.accordingly the patient underwent arteriography 01/23/2015 which found the superior mesenteric and celiac arteries not to be the involved vessels. The gastroduodenal artery showed multiple branches providing supply to the hypervascular duodenal mass, and this artery was coil embolized.. Also a port was placed at the time of this procedure with a view to eventual chemotherapy.  He was discharged 01/26/2015,but required readmission 02/07/2015 for another presyncopal episode. He presented to the emergency room where he was found to have a hemoglobin of 7.5. The patient was transfusedIIA hemoglobin of 10 and was discharged 02/08/1964. He was scheduled to start CHOP right toxin 02/09/1964.  His subsequent history is as detailed below.   PAST MEDICAL HISTORY: Past Medical History:  Diagnosis Date  . Anemia   . Cancer ([place])    Lymphoma  . Essential hypertension   . GERD (gastroesophageal reflux disease)   . GI bleed   . HLD (hyperlipidemia)   . Weakness     PAST SURGICAL HISTORY: Past Surgical History:  Procedure Laterality Date  . ESOPHAGOGASTRODUODENOSCOPY Left 01/18/2015   Procedure: ESOPHAGOGASTRODUODENOSCOPY (EGD);  Surgeon: Wilford Corner, MD;  [facility] ENDOSCOPY;  Service: Endoscopy;  Laterality: Left;  . INGUINAL HERNIA REPAIR Right 01/22/2015   Procedure: RIGHT INGUINAL LYMPH NODE BX;  Surgeon: Donnie Mesa, MD;  Location: [facility];  Service: General;  Laterality: Right;  . MEDIASTINOSCOPY N/A 05/05/2017   Procedure: MEDIASTINOSCOPY;  Surgeon: Grace Isaac, MD;  Location: [facility];  Service: Thoracic;   Laterality: N/A;  . PORTACATH PLACEMENT  01/26/2015    power port with tip SVC/RA [place]  . SKIN SURGERY     Small benign cysts over left scalp removed  . VIDEO BRONCHOSCOPY WITH ENDOBRONCHIAL ULTRASOUND N/A 05/05/2017   Procedure: VIDEO BRONCHOSCOPY WITH ENDOBRONCHIAL ULTRASOUND;  Surgeon: Grace Isaac, MD;  Location: [facility] OR;  Service: Thoracic;  Laterality: N/A;    FAMILY HISTORY Family History  Problem Relation Age of Onset  . Hypertension Mother   . Hypertension Father   . Kidney failure Father   . Hypertension Sister   . Diabetes Sister   . Prostate cancer Brother   . Lupus Sister    The patient's father died from CHF complications age 7. The patient's mother is 30 y/o as of December 2016. The patient has 9 brothers, 8 sisters. One brother has prostate cancer. There is no other cancer history in the family to his knowledge.   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  He retired in 2020.  His wife of 42+ years, [name], is disabled due to RA. Daughter, [name] works as a Teacher, early years/pre for [employer] (she previously worked at the [employer] in records). Son, [name] owns a window washing business. Son, [name] is in Press photographer at [employer]. All live in [place]. The patient has 3 grandchildren. He attends a [employer].    ADVANCED DIRECTIVES: Wife, [name], is automatically his HCPOA   HEALTH MAINTENANCE: Social History   Tobacco Use  . Smoking status: Never Smoker  .  Smokeless tobacco: Never Used  Substance Use Topics  . Alcohol use: Yes    Comment: occasional beer  . Drug use: No    Colonoscopy:  PSA: 2.27 on 01/22/2015  Hepatitis serologies (B surface antigen, hepatitis B core antibody and hepatitis C IgM) negative 01/18/2015  No Known Allergies  Current Outpatient Medications  Medication Sig Dispense Refill  . IMBRUVICA 560 MG TABS TAKE 1 TABLET BY MOUTH EVERY MORNING. 84 tablet 3  .  lisinopril-hydrochlorothiazide (ZESTORETIC) 20-25 MG tablet TAKE 1 TABLET BY MOUTH EVERY DAY 90 tablet 0  . lovastatin (MEVACOR) 20 MG tablet TAKE 1 TABLET BY MOUTH EVERYDAY AT BEDTIME 90 tablet 1   No current facility-administered medications for this visit.     OBJECTIVE: Middle-aged African-American man in no acute distress Vitals:   10/25/18 1404  BP: (!) 139/92  Pulse: (!) 111  Resp: 18  Temp: 97.8 F (36.6 C)  SpO2: 99%   Wt Readings from Last 3 Encounters:  10/25/18 211 lb 3.2 oz (95.8 kg)  06/21/18 209 lb 14.4 oz (95.2 kg)  03/16/18 211 lb (95.7 kg)   Body mass index is 30.3 kg/m.    ECOG FS:1 - Symptomatic but completely ambulatory  Ocular: Sclerae unicteric, pupils round and equal Ear-nose-throat: Wearing a mask Lymphatic: No cervical or supraclavicular adenopathy, no axillary or inguinal adenopathy Lungs no rales or rhonchi Heart regular rate and rhythm Abd soft, nontender, positive bowel sounds, no splenomegaly MSK no focal spinal tenderness, no joint edema Neuro: non-focal, well-oriented, appropriate affect   LAB RESULTS:  CMP     Component Value Date/Time   NA 140 10/18/2018 1111   NA 140 01/16/2017 0837   K 4.3 10/18/2018 1111   K 3.9 01/16/2017 0837   CL 104 10/18/2018 1111   CO2 28 10/18/2018 1111   CO2 24 01/16/2017 0837   GLUCOSE 90 10/18/2018 1111   GLUCOSE 102 01/16/2017 0837   BUN 22 10/18/2018 1111   BUN 20.4 01/16/2017 0837   CREATININE 1.25 (H) 10/18/2018 1111   CREATININE 1.2 01/16/2017 0837   CALCIUM 9.2 10/18/2018 1111   CALCIUM 8.9 01/16/2017 0837   PROT 6.9 10/18/2018 1111   PROT 6.9 01/16/2017 0837   ALBUMIN 4.3 10/18/2018 1111   ALBUMIN 4.0 01/16/2017 0837   AST 15 10/18/2018 1111   AST 20 01/16/2017 0837   ALT 11 10/18/2018 1111   ALT 22 01/16/2017 0837   ALKPHOS 36 (L) 10/18/2018 1111   ALKPHOS 47 01/16/2017 0837   BILITOT 0.6 10/18/2018 1111   BILITOT 0.36 01/16/2017 0837   GFRNONAA >60 10/18/2018 1111   GFRAA >60  10/18/2018 1111    INo results found for: SPEP, UPEP  Lab Results  Component Value Date   WBC 5.1 10/18/2018   NEUTROABS 3.1 10/18/2018   HGB 12.3 (L) 10/18/2018   HCT 38.4 (L) 10/18/2018   MCV 87.1 10/18/2018   PLT 291 10/18/2018      Chemistry      Component Value Date/Time   NA 140 10/18/2018 1111   NA 140 01/16/2017 0837   K 4.3 10/18/2018 1111   K 3.9 01/16/2017 0837   CL 104 10/18/2018 1111   CO2 28 10/18/2018 1111   CO2 24 01/16/2017 0837   BUN 22 10/18/2018 1111   BUN 20.4 01/16/2017 0837   CREATININE 1.25 (H) 10/18/2018 1111   CREATININE 1.2 01/16/2017 0837      Component Value Date/Time   CALCIUM 9.2 10/18/2018 1111   CALCIUM 8.9 01/16/2017  0837   ALKPHOS 36 (L) 10/18/2018 1111   ALKPHOS 47 01/16/2017 0837   AST 15 10/18/2018 1111   AST 20 01/16/2017 0837   ALT 11 10/18/2018 1111   ALT 22 01/16/2017 0837   BILITOT 0.6 10/18/2018 1111   BILITOT 0.36 01/16/2017 0837       No results found for: LABCA2  No components found for: [id number]  No results for input(s): INR in the last 168 hours.  Urinalysis    Component Value Date/Time   COLORURINE YELLOW 01/23/2015 0805   APPEARANCEUR CLEAR 01/23/2015 0805   LABSPEC 1.014 01/23/2015 0805   PHURINE 7.5 01/23/2015 0805   GLUCOSEU NEGATIVE 01/23/2015 0805   HGBUR NEGATIVE 01/23/2015 0805   BILIRUBINUR NEGATIVE 01/23/2015 0805   KETONESUR NEGATIVE 01/23/2015 0805   PROTEINUR NEGATIVE 01/23/2015 0805   NITRITE NEGATIVE 01/23/2015 0805   LEUKOCYTESUR NEGATIVE 01/23/2015 0805   Beta-2 microglobulin is normal.  LDH is slightly elevated   STUDIES: No results found.    ASSESSMENT: 68 y.o. [place] man with a diagnosis of mantle cell non-Hodgkin's lymphoma of extranodal and solid organ sites presenting with syncope secondary to bleeding from a large ulcerated duodenal ulcer  (a) s/p coil embolization of the feeding (gastro-duodenal) artery 01/23/2015  (b) anemia--scant iron stores on bone marrow  biopsy--s/p feraheme 02/13/2015  (1) right inguinal lymph node biopsy 01/22/2015 confirms mantle cell lymphoma  (a) bone marrow biopsy 02/06/2015 positive for involvement by the patient's mantle cell lymphoma  (b) IPI score of 5 (high risk) predicts a 5 year progression free survival of 50% with CHOP-Rituxan chemotherapy  (c) MIPI score of 5 (intermediate risk) predicts a median survival of 58 months  (d) the cells were CD5 positive, CD19 and 20+, CD23 negative, lambda restricted  (2) CHOP/Rituxan started 02/09/2015, completed 8 cycles 07/05/2015  (3)  [facility] consultation 04/06/2015. Patient opted against transplant consolidation  (4) rituximab maintenance started 08/16/2015, repeat every 2 months, discontinued February 2019  (a) PET scan had shown a suggestion of recurrence, but see (6) below  (5) PSA increase noted July 2017, back to baseline by December 2017  (6) restaging PET scan 11/17/2016 is consistent with disease progression  (a) repeat PET scan 02/02/2017 confirms increase in lymph nodes  (b) PET scan 04/20/2017 shows increasing hypermetabolic adenopathy  (c) bronchoscopy/mediastinoscopy 05/05/2017 shows granulomatous disease, noncaseating, with negative AFB/ GMS stains; <10% of lymphocytes are B-cell with slight lambda excess  (d) PET scan on 07/06/2017 shows mild increase in mediastinal lymphadenopathy. Increased hypermetabolic supraclavicular lymph node.  (e) PET scan 12/24/2017 interpreted as showing evidence of partial response   (f) PET 09/21/2018 shows decreased size and metabolism of scattered measurable nodes; Deauville 4   (7) ibrutinib 420 mg daily started 02/09/2017, discontinued March 2019  (a) resumed April 2019 at 560 mg daily  (8) granulomatous inflammation noted on pathology from 05/05/2017 mediastinoscopy  (a) fungal bacterial and acid-fast studies negative   (b) consider sarcoid   PLAN: [name] continues to tolerate ibrutinib well.  He has no "B" symptoms and  an excellent functional status.  His PET scan shows continuing response.  Accordingly we are making no changes in his treatment.  He [name] continue to take ibrutinib at the current dose.  I am going to see him again in 6 months with labs and a visit and we [name] repeat a PET scan in August of next year.   He is taking appropriate pandemic precautions.   He knows to call for any new problems that  may develop before the next visit.   Fletcher Ostermiller, Virgie Dad, MD  10/25/18 2:27 PM Medical Oncology and Hematology [facility] [place], [place] [address] Tel. [phone]    Fax. [phone]  I, Jacqualyn Posey am acting as a Education administrator for Chauncey Cruel, MD.   I, Lurline Del MD, have reviewed the above documentation for accuracy and completeness, and I agree with the above.

## 2018-10-25 ENCOUNTER — Other Ambulatory Visit: Payer: Self-pay

## 2018-10-25 ENCOUNTER — Inpatient Hospital Stay (HOSPITAL_BASED_OUTPATIENT_CLINIC_OR_DEPARTMENT_OTHER): Payer: Medicare HMO | Admitting: Oncology

## 2018-10-25 VITALS — BP 139/92 | HR 111 | Temp 97.8°F | Resp 18 | Ht 70.0 in | Wt 211.2 lb

## 2018-10-25 DIAGNOSIS — C8319 Mantle cell lymphoma, extranodal and solid organ sites: Secondary | ICD-10-CM

## 2018-10-25 DIAGNOSIS — K276 Chronic or unspecified peptic ulcer, site unspecified, with both hemorrhage and perforation: Secondary | ICD-10-CM | POA: Diagnosis not present

## 2018-10-25 DIAGNOSIS — C8315 Mantle cell lymphoma, lymph nodes of inguinal region and lower limb: Secondary | ICD-10-CM | POA: Diagnosis not present

## 2018-10-25 DIAGNOSIS — Z79899 Other long term (current) drug therapy: Secondary | ICD-10-CM | POA: Diagnosis not present

## 2018-10-25 DIAGNOSIS — R1900 Intra-abdominal and pelvic swelling, mass and lump, unspecified site: Secondary | ICD-10-CM

## 2018-10-25 DIAGNOSIS — E785 Hyperlipidemia, unspecified: Secondary | ICD-10-CM | POA: Diagnosis not present

## 2018-10-25 DIAGNOSIS — Z8042 Family history of malignant neoplasm of prostate: Secondary | ICD-10-CM | POA: Diagnosis not present

## 2018-10-25 DIAGNOSIS — I10 Essential (primary) hypertension: Secondary | ICD-10-CM | POA: Diagnosis not present

## 2018-10-26 ENCOUNTER — Telehealth: Payer: Self-pay | Admitting: Oncology

## 2018-10-26 NOTE — Telephone Encounter (Signed)
I talk with patient regarding schedule he looked at my chart

## 2018-10-28 ENCOUNTER — Telehealth: Payer: Self-pay

## 2018-10-28 MED FILL — IMBRUVICA 560 MG TAB: 560 | 28 days supply | Qty: 28 | Fill #5

## 2018-10-28 NOTE — Telephone Encounter (Signed)
Oral Oncology Patient Advocate Encounter   Was successful in securing patient an $53 grant from Patient [facility]) to provide copayment coverage for Imbruvica.  This will keep the out of pocket expense at $0.     I have spoken with the patient.    The billing information is as follows and has been shared with [place].   Member ID: [id number] Group ID: [id number] RxBin: [id number] Dates of Eligibility: 07/30/18 through 10/27/19  [place] Patient [place] Phone [phone] Fax [phone] 10/28/2018    9:35 AM

## 2018-11-19 ENCOUNTER — Other Ambulatory Visit: Payer: Self-pay | Admitting: Oncology

## 2018-11-29 MED FILL — IMBRUVICA 560 MG TAB: 560 | 28 days supply | Qty: 28 | Fill #6

## 2018-12-23 MED FILL — IMBRUVICA 560 MG TAB: 560 | 28 days supply | Qty: 28 | Fill #7

## 2019-01-18 ENCOUNTER — Other Ambulatory Visit: Payer: Self-pay | Admitting: Oncology

## 2019-01-18 DIAGNOSIS — E785 Hyperlipidemia, unspecified: Secondary | ICD-10-CM

## 2019-01-24 MED FILL — IMBRUVICA 560 MG TAB: 560 | 28 days supply | Qty: 28 | Fill #8

## 2019-02-16 ENCOUNTER — Other Ambulatory Visit: Payer: Self-pay | Admitting: Oncology

## 2019-02-23 MED FILL — IMBRUVICA 560 MG TAB: 560 | 28 days supply | Qty: 28 | Fill #9

## 2019-03-22 MED FILL — IMBRUVICA 560 MG TAB: 560 | 28 days supply | Qty: 28 | Fill #10

## 2019-04-19 MED FILL — IMBRUVICA 560 MG TAB: 560 | 28 days supply | Qty: 28 | Fill #11

## 2019-04-20 ENCOUNTER — Telehealth: Payer: Self-pay | Admitting: *Deleted

## 2019-04-20 NOTE — Telephone Encounter (Signed)
No entry 

## 2019-04-21 ENCOUNTER — Other Ambulatory Visit: Payer: Self-pay

## 2019-04-21 ENCOUNTER — Inpatient Hospital Stay: Payer: Medicare HMO

## 2019-04-21 ENCOUNTER — Inpatient Hospital Stay: Payer: Medicare HMO | Attending: Oncology

## 2019-04-21 DIAGNOSIS — Z832 Family history of diseases of the blood and blood-forming organs and certain disorders involving the immune mechanism: Secondary | ICD-10-CM | POA: Diagnosis not present

## 2019-04-21 DIAGNOSIS — R195 Other fecal abnormalities: Secondary | ICD-10-CM | POA: Diagnosis not present

## 2019-04-21 DIAGNOSIS — C8319 Mantle cell lymphoma, extranodal and solid organ sites: Secondary | ICD-10-CM

## 2019-04-21 DIAGNOSIS — Z8249 Family history of ischemic heart disease and other diseases of the circulatory system: Secondary | ICD-10-CM | POA: Insufficient documentation

## 2019-04-21 DIAGNOSIS — Z79899 Other long term (current) drug therapy: Secondary | ICD-10-CM | POA: Insufficient documentation

## 2019-04-21 DIAGNOSIS — E785 Hyperlipidemia, unspecified: Secondary | ICD-10-CM | POA: Insufficient documentation

## 2019-04-21 DIAGNOSIS — Z8042 Family history of malignant neoplasm of prostate: Secondary | ICD-10-CM | POA: Diagnosis not present

## 2019-04-21 DIAGNOSIS — C8313 Mantle cell lymphoma, intra-abdominal lymph nodes: Secondary | ICD-10-CM | POA: Diagnosis not present

## 2019-04-21 DIAGNOSIS — R55 Syncope and collapse: Secondary | ICD-10-CM | POA: Insufficient documentation

## 2019-04-21 DIAGNOSIS — Z833 Family history of diabetes mellitus: Secondary | ICD-10-CM | POA: Diagnosis not present

## 2019-04-21 DIAGNOSIS — Z841 Family history of disorders of kidney and ureter: Secondary | ICD-10-CM | POA: Diagnosis not present

## 2019-04-21 LAB — LACTATE DEHYDROGENASE: LDH: 259 U/L — ABNORMAL HIGH (ref 98–192)

## 2019-04-22 LAB — BETA 2 MICROGLOBULIN, SERUM: Beta-2 Microglobulin: 1.9 mg/L (ref 0.6–2.4)

## 2019-04-24 NOTE — Progress Notes (Signed)
[place]  Telephone:(336) [phone] Fax:(336) [phone]    ID: [name] DOB: [birth year]  MR#: [id number]  PFX#:[id number]  Patient Care Team: Gary Pal, DO as PCP - General (Family Medicine) [name], Gary Dad, MD as Consulting Physician (Oncology) Gary Mesa, MD as Consulting Physician (General Surgery) Gary Corner, MD as Consulting Physician (Gastroenterology) Gary Specter, MD as Referring Physician (Internal Medicine) Gary Earthly, MD as Referring Physician (Hematology and Oncology) Gary Isaac, MD as Consulting Physician (Cardiothoracic Surgery) OTHER MD: Gary Form MD; [name] [phone]   CHIEF COMPLAINT: 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  He generally does well with this medication but he is having some unusual symptoms and he and his wife are wondering if the ibrutinib could possibly be the cause.  He is not having bruising or palpitations which would be more likely.   REVIEW OF SYSTEMS: [name] has had some semifainting spells.  He [name] suddenly feel faint and his wife [name] have to hold him up.  He thinks this has happened a couple of times in the last 6 months but she thinks is more frequent, perhaps once a month or so.  In addition she describes symptoms as forgetful.  He [name] repeat a question, or forget to bring something from the list she gave him for the groceries.  He has had no fever, has not noted any adenopathy, and there is no significant weight change.  Both he and his wife have had both her Covid vaccines, most recently about 10 days ago.  He did not appear to have developed post vaccine adenopathy per his account   HISTORY OF PRESENT ILLNESS: From the original intake  note:   "[name]" was in his usual good health until early December 2016, when he had a brief syncopal episode. He "blew this off" and it did not happen again until 01/17/2015 when he felt weak at work and later fainted at home. He was brought to the ED where he was found to be guaiac positive (denies melena before that date) with a Hb of 4.5 MCV 82.2, platelets 295K, WBC 9.1 and normal INR and PTT. He was admitted and underwent EGD under Gary [name] 01/18/2015 showing a very large duodenal ulcer w clot. Abdominal US 01/19/2015 was not very informative but CT abd/pelvis 12/17,2016 showed adenopathy in the anterior mediastinum, retroperitoneum and pelvis, with a large ulcerated mass involving the distal stomach/proximal duodenum and extending into the gastrohepatic region.   On 01/22/2015 he underwent right inguinal lymph node biopsy, consistent with mantle cell lymphoma (([id number] and [id number])). Specifically the lymph node showed effacement of the architecture by sheets of small to medium sized lymphocytes with no apparent nodularity. The cells were positive for CD20, CD5, BCL-2, CD21, and cyclin D1. He 67 was low (10-20%). CD21 revealed a few residual germinal centers area at the B cells were negative for CD10, and BCL-6. CD23 was negative.  On 02/06/2015 the patient underwent a bone marrow aspirate and biopsy. The pathology from this procedure (FZB 17-2 and 17-7) was positive for involvement by non-Hodgkin's lymphoma with numerous atypical interstitial and paratrabecular lymphoid aggregates consistent with mantle cell lymphoma. Iron stain showed scant iron flow cytometry confirmed a monoclonal B-cell population, lambda restricted, consistent with the patient's known diagnosis  of mantle cell lymphoma.  On 01/18/2015 the patient underwent EGD which found a massive duodenal bulb ulcer with adherent clot, although no active bleeding.his hemoglobin was in the 8.4 range and stable. He was discharged on  01/22/2015, but within 24 hours was readmitted with syncope and altered mental status. He had aspirated and required intubation for his respiratory failure. Hemoglobin was down to 6.3. An NG tube was placed, the patient was multiply transfused, and Surgery was considered but felt not to be feasible.accordingly the patient underwent arteriography 01/23/2015 which found the superior mesenteric and celiac arteries not to be the involved vessels. The gastroduodenal artery showed multiple branches providing supply to the hypervascular duodenal mass, and this artery was coil embolized.. Also a port was placed at the time of this procedure with a view to eventual chemotherapy.  He was discharged 01/26/2015,but required readmission 02/07/2015 for another presyncopal episode. He presented to the emergency room where he was found to have a hemoglobin of 7.5. The patient was transfusedIIA hemoglobin of 10 and was discharged 02/07/2014. He was scheduled to start CHOP right toxin 02/08/2014.  His subsequent history is as detailed below.   PAST MEDICAL HISTORY: Past Medical History:  Diagnosis Date  . Anemia   . Cancer ([place])    Lymphoma  . Essential hypertension   . GERD (gastroesophageal reflux disease)   . GI bleed   . HLD (hyperlipidemia)   . Weakness     PAST SURGICAL HISTORY: Past Surgical History:  Procedure Laterality Date  . ESOPHAGOGASTRODUODENOSCOPY Left 01/18/2015   Procedure: ESOPHAGOGASTRODUODENOSCOPY (EGD);  Surgeon: Gary Corner, MD;  Location: [facility] ENDOSCOPY;  Service: Endoscopy;  Laterality: Left;  . INGUINAL HERNIA REPAIR Right 01/22/2015   Procedure: RIGHT INGUINAL LYMPH NODE BX;  Surgeon: Gary Mesa, MD;  Location: [facility];  Service: General;  Laterality: Right;  . MEDIASTINOSCOPY N/A 05/05/2017   Procedure: MEDIASTINOSCOPY;  Surgeon: Gary Isaac, MD;  Location: [facility];  Service: Thoracic;  Laterality: N/A;  . PORTACATH PLACEMENT  01/26/2015    power port with tip SVC/RA  [place]  . SKIN SURGERY     Small benign cysts over left scalp removed  . VIDEO BRONCHOSCOPY WITH ENDOBRONCHIAL ULTRASOUND N/A 05/05/2017   Procedure: VIDEO BRONCHOSCOPY WITH ENDOBRONCHIAL ULTRASOUND;  Surgeon: Gary Isaac, MD;  Location: [facility] OR;  Service: Thoracic;  Laterality: N/A;    FAMILY HISTORY Family History  Problem Relation Age of Onset  . Hypertension Mother   . Hypertension Father   . Kidney failure Father   . Hypertension Sister   . Diabetes Sister   . Prostate cancer Brother   . Lupus Sister    The patient's father died from CHF complications age 46. The patient's mother is 58 y/o as of December 2016. The patient has 9 brothers, 8 sisters. One brother has prostate cancer. There is no other cancer history in the family to his knowledge.   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  He retired in 2020.  His wife of 42+ years, [name], is disabled due to RA. Daughter, [name] works as a Teacher, early years/pre for [employer] (she previously worked at the [employer] in records). Son, [name] owns a window washing business. Son, [name] is in Press photographer at [employer]. All live in [place]. The patient has 3 grandchildren. He attends a [employer].    ADVANCED DIRECTIVES: Wife, [name], is automatically his HCPOA   HEALTH MAINTENANCE: Social  History   Tobacco Use  . Smoking status: Never Smoker  . Smokeless tobacco: Never Used  Substance Use Topics  . Alcohol use: Yes    Comment: occasional beer  . Drug use: No    Colonoscopy:  PSA: 2.27 on 01/22/2015  Hepatitis serologies (B surface antigen, hepatitis B core antibody and hepatitis C IgM) negative 01/18/2015  No Known Allergies  Current Outpatient Medications  Medication Sig Dispense Refill  . Ibrutinib (IMBRUVICA) 420 MG TABS Take 420 mg by mouth 1 day or 1 dose for 1 dose. 30 tablet 12  . lisinopril-hydrochlorothiazide (ZESTORETIC) 20-25 MG tablet TAKE 1 TABLET  BY MOUTH EVERY DAY 90 tablet 0  . lovastatin (MEVACOR) 20 MG tablet TAKE 1 TABLET BY MOUTH EVERYDAY AT BEDTIME 90 tablet 1   No current facility-administered medications for this visit.    OBJECTIVE: African-American man who appears younger than stated age 42:   04/25/19 1314  BP: 135/80  Pulse: 71  Resp: 18  Temp: 98.5 F (36.9 C)  SpO2: 100%   Wt Readings from Last 3 Encounters:  04/25/19 207 lb 6.4 oz (94.1 kg)  10/25/18 211 lb 3.2 oz (95.8 kg)  06/21/18 209 lb 14.4 oz (95.2 kg)   Body mass index is 29.76 kg/m.    ECOG FS:1 - Symptomatic but completely ambulatory  Sclerae unicteric, EOMs intact Wearing a mask No cervical or supraclavicular adenopathy, no axillary adenopathy Lungs no rales or rhonchi Heart regular rate and rhythm Abd soft, nontender, positive bowel sounds, no splenomegaly MSK no focal spinal tenderness, no upper extremity lymphedema Neuro: nonfocal, well oriented, appropriate affect  LAB RESULTS:  CMP     Component Value Date/Time   NA 140 10/18/2018 1111   NA 140 01/16/2017 0837   K 4.3 10/18/2018 1111   K 3.9 01/16/2017 0837   CL 104 10/18/2018 1111   CO2 28 10/18/2018 1111   CO2 24 01/16/2017 0837   GLUCOSE 90 10/18/2018 1111   GLUCOSE 102 01/16/2017 0837   BUN 22 10/18/2018 1111   BUN 20.4 01/16/2017 0837   CREATININE 1.25 (H) 10/18/2018 1111   CREATININE 1.2 01/16/2017 0837   CALCIUM 9.2 10/18/2018 1111   CALCIUM 8.9 01/16/2017 0837   PROT 6.9 10/18/2018 1111   PROT 6.9 01/16/2017 0837   ALBUMIN 4.3 10/18/2018 1111   ALBUMIN 4.0 01/16/2017 0837   AST 15 10/18/2018 1111   AST 20 01/16/2017 0837   ALT 11 10/18/2018 1111   ALT 22 01/16/2017 0837   ALKPHOS 36 (L) 10/18/2018 1111   ALKPHOS 47 01/16/2017 0837   BILITOT 0.6 10/18/2018 1111   BILITOT 0.36 01/16/2017 0837   GFRNONAA >60 10/18/2018 1111   GFRAA >60 10/18/2018 1111    No results found for: TOTALPROTELP, ALBUMINELP, A1GS, A2GS, BETS, BETA2SER, GAMS, MSPIKE, SPEI   Lab Results  Component Value Date   WBC 5.1 10/18/2018   NEUTROABS 3.1 10/18/2018   HGB 12.3 (L) 10/18/2018   HCT 38.4 (L) 10/18/2018   MCV 87.1 10/18/2018   PLT 291 10/18/2018    No results found for: LABCA2  No components found for: [id number]  No results for input(s): INR in the last 168 hours.  No results found for: LABCA2  No results found for: WUJ811  No results found for: BJY782  No results found for: NFA213  No results found for: CA2729  No components found for: HGQUANT  No results found for: CEA1 / No results found for: CEA1   No results found for:  AFPTUMOR  No results found for: CHROMOGRNA  No results found for: KPAFRELGTCHN, LAMBDASER, KAPLAMBRATIO (kappa/lambda light chains)  No results found for: HGBA, HGBA2QUANT, HGBFQUANT, HGBSQUAN (Hemoglobinopathy evaluation)   Lab Results  Component Value Date   LDH 259 (H) 04/21/2019    Lab Results  Component Value Date   IRON 115 02/16/2015   TIBC 345 02/16/2015   IRONPCTSAT 33 02/16/2015   (Iron and TIBC)  Lab Results  Component Value Date   FERRITIN 85 02/09/2015    Urinalysis    Component Value Date/Time   COLORURINE YELLOW 01/23/2015 0805   APPEARANCEUR CLEAR 01/23/2015 0805   LABSPEC 1.014 01/23/2015 0805   PHURINE 7.5 01/23/2015 0805   GLUCOSEU NEGATIVE 01/23/2015 0805   HGBUR NEGATIVE 01/23/2015 0805   BILIRUBINUR NEGATIVE 01/23/2015 0805   KETONESUR NEGATIVE 01/23/2015 0805   PROTEINUR NEGATIVE 01/23/2015 0805   NITRITE NEGATIVE 01/23/2015 0805   LEUKOCYTESUR NEGATIVE 01/23/2015 0805    STUDIES: No results found.    ASSESSMENT: 69 y.o. [place] man with a diagnosis of mantle cell non-Hodgkin's lymphoma of extranodal and solid organ sites presenting with syncope secondary to bleeding from a large ulcerated duodenal ulcer  (a) s/p coil embolization of the feeding (gastro-duodenal) artery 01/23/2015  (b) anemia--scant iron stores on bone marrow biopsy--s/p feraheme 02/13/2015   (1) right inguinal lymph node biopsy 01/22/2015 confirms mantle cell lymphoma  (a) bone marrow biopsy 02/06/2015 positive for involvement by the patient's mantle cell lymphoma  (b) IPI score of 5 (high risk) predicts a 5 year progression free survival of 50% with CHOP-Rituxan chemotherapy  (c) MIPI score of 5 (intermediate risk) predicts a median survival of 58 months  (d) the cells were CD5 positive, CD19 and 20+, CD23 negative, lambda restricted  (2) CHOP/Rituxan started 02/09/2015, completed 8 cycles 07/05/2015  (3)  [facility] consultation 04/06/2015. Patient opted against transplant consolidation  (4) rituximab maintenance started 08/16/2015, repeat every 2 months, discontinued February 2019  (a) PET scan had shown a suggestion of recurrence, but see (6) below  (5) PSA increase noted July 2017, back to baseline by December 2017  (6) restaging PET scan 11/17/2016 is consistent with disease progression  (a) repeat PET scan 02/02/2017 confirms increase in lymph nodes  (b) PET scan 04/20/2017 shows increasing hypermetabolic adenopathy  (c) bronchoscopy/mediastinoscopy 05/05/2017 shows granulomatous disease, noncaseating, with negative AFB/ GMS stains; <10% of lymphocytes are B-cell with slight lambda excess  (d) PET scan on 07/06/2017 shows mild increase in mediastinal lymphadenopathy. Increased hypermetabolic supraclavicular lymph node.  (e) PET scan 12/24/2017 interpreted as showing evidence of partial response   (f) PET 09/21/2018 shows decreased size and metabolism of scattered measurable nodes; [place] 4   (7) ibrutinib 420 mg daily started 02/09/2017, discontinued March 2019  (a) resumed April 2019 at 560 mg daily  (8) granulomatous inflammation noted on pathology from 05/05/2017 mediastinoscopy  (a) fungal bacterial and acid-fast studies negative   (b) consider sarcoid   PLAN: [name] is now more than 4 years out from initial diagnosis of mantle cell lymphoma, and clinically  continues in remission.  We do have to restage him.  He is due for a PET scan.  Because of the cognitive issues that his wife mentions I am going to go ahead and get a brain MRI as well just to make sure we are not dealing with central nervous system involvement.  I am also getting some extra lab work including a B12 and TSH.  He is on a high dose of ibrutinib.  If all the scans are negative, I [name] drop his dose back to 420.  I wonder if that [name] solve some of the problems that he is experiencing  Otherwise he [name] have lab work in 3 months and a visit again in 6.  They know to call for any other issue that may develop before then.  Total encounter time 35 minutes.*  Antonella Upson, Gary Dad, MD  04/25/19 1:47 PM Medical Oncology and Hematology [facility] [place], [place] [address] Tel. [phone]    Fax. [phone]   I, Wilburn Mylar, am acting as scribe for Gary. Virgie Taylor. [name].  I, Lurline Del MD, have reviewed the above documentation for accuracy and completeness, and I agree with the above.    *Total Encounter Time as defined by the Centers for Medicare and Medicaid Services includes, in addition to the face-to-face time of a patient visit (documented in the note above) non-face-to-face time: obtaining and reviewing outside history, ordering and reviewing medications, tests or procedures, care coordination (communications with other health care professionals or caregivers) and documentation in the medical record.

## 2019-04-25 ENCOUNTER — Inpatient Hospital Stay (HOSPITAL_BASED_OUTPATIENT_CLINIC_OR_DEPARTMENT_OTHER): Payer: Medicare HMO | Admitting: Oncology

## 2019-04-25 ENCOUNTER — Other Ambulatory Visit: Payer: Self-pay

## 2019-04-25 VITALS — BP 135/80 | HR 71 | Temp 98.5°F | Resp 18 | Ht 70.0 in | Wt 207.4 lb

## 2019-04-25 DIAGNOSIS — C8319 Mantle cell lymphoma, extranodal and solid organ sites: Secondary | ICD-10-CM | POA: Diagnosis not present

## 2019-04-25 DIAGNOSIS — E785 Hyperlipidemia, unspecified: Secondary | ICD-10-CM | POA: Diagnosis not present

## 2019-04-25 DIAGNOSIS — C8313 Mantle cell lymphoma, intra-abdominal lymph nodes: Secondary | ICD-10-CM | POA: Diagnosis not present

## 2019-04-25 DIAGNOSIS — Z8042 Family history of malignant neoplasm of prostate: Secondary | ICD-10-CM | POA: Diagnosis not present

## 2019-04-25 DIAGNOSIS — R4689 Other symptoms and signs involving appearance and behavior: Secondary | ICD-10-CM | POA: Insufficient documentation

## 2019-04-25 DIAGNOSIS — Z8249 Family history of ischemic heart disease and other diseases of the circulatory system: Secondary | ICD-10-CM | POA: Diagnosis not present

## 2019-04-25 DIAGNOSIS — R4189 Other symptoms and signs involving cognitive functions and awareness: Secondary | ICD-10-CM | POA: Diagnosis not present

## 2019-04-25 DIAGNOSIS — Z841 Family history of disorders of kidney and ureter: Secondary | ICD-10-CM | POA: Diagnosis not present

## 2019-04-25 DIAGNOSIS — K276 Chronic or unspecified peptic ulcer, site unspecified, with both hemorrhage and perforation: Secondary | ICD-10-CM | POA: Diagnosis not present

## 2019-04-25 DIAGNOSIS — R195 Other fecal abnormalities: Secondary | ICD-10-CM | POA: Diagnosis not present

## 2019-04-25 DIAGNOSIS — Z833 Family history of diabetes mellitus: Secondary | ICD-10-CM | POA: Diagnosis not present

## 2019-04-25 DIAGNOSIS — R55 Syncope and collapse: Secondary | ICD-10-CM | POA: Diagnosis not present

## 2019-04-25 DIAGNOSIS — Z79899 Other long term (current) drug therapy: Secondary | ICD-10-CM | POA: Diagnosis not present

## 2019-04-25 MED ORDER — IMBRUVICA 420 MG PO TABS: 420.0000 mg | ORAL_TABLET | ORAL | 12 refills | Status: AC

## 2019-04-27 ENCOUNTER — Telehealth: Payer: Self-pay | Admitting: Oncology

## 2019-04-27 NOTE — Telephone Encounter (Signed)
Scheduled appt per 3/22 los. Pt confirmed appt dates and times.

## 2019-04-29 ENCOUNTER — Inpatient Hospital Stay: Payer: Medicare HMO

## 2019-04-29 ENCOUNTER — Other Ambulatory Visit: Payer: Self-pay

## 2019-04-29 DIAGNOSIS — Z8042 Family history of malignant neoplasm of prostate: Secondary | ICD-10-CM | POA: Diagnosis not present

## 2019-04-29 DIAGNOSIS — C8319 Mantle cell lymphoma, extranodal and solid organ sites: Secondary | ICD-10-CM

## 2019-04-29 DIAGNOSIS — Z79899 Other long term (current) drug therapy: Secondary | ICD-10-CM | POA: Diagnosis not present

## 2019-04-29 DIAGNOSIS — R195 Other fecal abnormalities: Secondary | ICD-10-CM | POA: Diagnosis not present

## 2019-04-29 DIAGNOSIS — Z833 Family history of diabetes mellitus: Secondary | ICD-10-CM | POA: Diagnosis not present

## 2019-04-29 DIAGNOSIS — C8313 Mantle cell lymphoma, intra-abdominal lymph nodes: Secondary | ICD-10-CM | POA: Diagnosis not present

## 2019-04-29 DIAGNOSIS — Z8249 Family history of ischemic heart disease and other diseases of the circulatory system: Secondary | ICD-10-CM | POA: Diagnosis not present

## 2019-04-29 DIAGNOSIS — E785 Hyperlipidemia, unspecified: Secondary | ICD-10-CM | POA: Diagnosis not present

## 2019-04-29 DIAGNOSIS — R55 Syncope and collapse: Secondary | ICD-10-CM | POA: Diagnosis not present

## 2019-04-29 DIAGNOSIS — Z841 Family history of disorders of kidney and ureter: Secondary | ICD-10-CM | POA: Diagnosis not present

## 2019-04-29 DIAGNOSIS — K276 Chronic or unspecified peptic ulcer, site unspecified, with both hemorrhage and perforation: Secondary | ICD-10-CM

## 2019-04-29 LAB — CBC WITH DIFFERENTIAL/PLATELET
Abs Immature Granulocytes: 0.03 10*3/uL (ref 0.00–0.07)
Basophils Absolute: 0 10*3/uL (ref 0.0–0.1)
Basophils Relative: 1 %
Eosinophils Absolute: 0.1 10*3/uL (ref 0.0–0.5)
Eosinophils Relative: 2 %
HCT: 39.6 % (ref 39.0–52.0)
Hemoglobin: 12.9 g/dL — ABNORMAL LOW (ref 13.0–17.0)
Immature Granulocytes: 1 %
Lymphocytes Relative: 20 %
Lymphs Abs: 0.9 10*3/uL (ref 0.7–4.0)
MCH: 28.1 pg (ref 26.0–34.0)
MCHC: 32.6 g/dL (ref 30.0–36.0)
MCV: 86.3 fL (ref 80.0–100.0)
Monocytes Absolute: 0.7 10*3/uL (ref 0.1–1.0)
Monocytes Relative: 15 %
Neutro Abs: 2.7 10*3/uL (ref 1.7–7.7)
Neutrophils Relative %: 61 %
Platelets: 331 10*3/uL (ref 150–400)
RBC: 4.59 MIL/uL (ref 4.22–5.81)
RDW: 14.1 % (ref 11.5–15.5)
WBC: 4.4 10*3/uL (ref 4.0–10.5)
nRBC: 0 % (ref 0.0–0.2)

## 2019-04-29 LAB — COMPREHENSIVE METABOLIC PANEL
ALT: 17 U/L (ref 0–44)
AST: 22 U/L (ref 15–41)
Albumin: 4.2 g/dL (ref 3.5–5.0)
Alkaline Phosphatase: 34 U/L — ABNORMAL LOW (ref 38–126)
Anion gap: 10 (ref 5–15)
BUN: 27 mg/dL — ABNORMAL HIGH (ref 8–23)
CO2: 25 mmol/L (ref 22–32)
Calcium: 9.2 mg/dL (ref 8.9–10.3)
Chloride: 104 mmol/L (ref 98–111)
Creatinine, Ser: 1.32 mg/dL — ABNORMAL HIGH (ref 0.61–1.24)
GFR calc Af Amer: >60 mL/min (ref >60)
GFR calc non Af Amer: 56 mL/min — ABNORMAL LOW (ref >60)
Glucose, Bld: 94 mg/dL (ref 70–99)
Potassium: 4 mmol/L (ref 3.5–5.1)
Sodium: 139 mmol/L (ref 135–145)
Total Bilirubin: 0.7 mg/dL (ref 0.3–1.2)
Total Protein: 7.1 g/dL (ref 6.5–8.1)

## 2019-04-29 LAB — LACTATE DEHYDROGENASE: LDH: 272 U/L — ABNORMAL HIGH (ref 98–192)

## 2019-04-29 LAB — VITAMIN B12: Vitamin B-12: 403 pg/mL (ref 180–914)

## 2019-04-30 LAB — THYROID PANEL WITH TSH
Free Thyroxine Index: 1.7 (ref 1.2–4.9)
T3 Uptake Ratio: 25 % (ref 24–39)
T4, Total: 6.9 ug/dL (ref 4.5–12.0)
TSH: 2.33 u[IU]/mL (ref 0.450–4.500)

## 2019-05-03 ENCOUNTER — Other Ambulatory Visit: Payer: Self-pay | Admitting: *Deleted

## 2019-05-03 MED ORDER — DIAZEPAM 5 MG PO TABS: ORAL_TABLET | ORAL | 1 refills | Status: DC

## 2019-05-06 ENCOUNTER — Other Ambulatory Visit: Payer: Self-pay | Admitting: *Deleted

## 2019-05-06 DIAGNOSIS — C8319 Mantle cell lymphoma, extranodal and solid organ sites: Secondary | ICD-10-CM

## 2019-05-09 ENCOUNTER — Other Ambulatory Visit: Payer: Self-pay

## 2019-05-09 ENCOUNTER — Encounter (HOSPITAL_COMMUNITY)
Admission: RE | Admit: 2019-05-09 | Discharge: 2019-05-09 | Disposition: A | Discharge status: Home / Self Care | Payer: Medicare HMO | Source: Ambulatory Visit | Attending: Oncology | Admitting: Oncology

## 2019-05-09 ENCOUNTER — Ambulatory Visit (HOSPITAL_COMMUNITY): Payer: Medicare HMO

## 2019-05-09 DIAGNOSIS — I2510 Atherosclerotic heart disease of native coronary artery without angina pectoris: Secondary | ICD-10-CM | POA: Insufficient documentation

## 2019-05-09 DIAGNOSIS — C8319 Mantle cell lymphoma, extranodal and solid organ sites: Secondary | ICD-10-CM | POA: Insufficient documentation

## 2019-05-09 DIAGNOSIS — C8310 Mantle cell lymphoma, unspecified site: Secondary | ICD-10-CM | POA: Diagnosis not present

## 2019-05-09 DIAGNOSIS — C8312 Mantle cell lymphoma, intrathoracic lymph nodes: Secondary | ICD-10-CM | POA: Diagnosis not present

## 2019-05-09 LAB — GLUCOSE, CAPILLARY: Glucose-Capillary: 86 mg/dL (ref 70–99)

## 2019-05-09 IMAGING — PT NM PET TUM IMG RESTAG (PS) SKULL BASE T - THIGH
9 series · 25 of 25 positions shown · non-contrast
Comparison: [DATE]

CLINICAL DATA: Subsequent treatment strategy for mantle cell
lymphoma with history of duodenal mass.

EXAM:
NUCLEAR MEDICINE PET SKULL BASE TO THIGH
TECHNIQUE: 10.4 mCi F-18 FDG was injected intravenously. Full-ring PET imaging
was performed from the skull base to thigh after the radiotracer. CT
data was obtained and used for attenuation correction and anatomic
localization.
Fasting blood glucose: 86 mg/dl

[Series 3: pet sk_thigh ac · axial · 5.0mm · 4.07mm/px · z∈[-880,-8]mm · 4 of 219 slices shown]
[im 1/219]
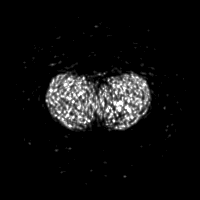
[im 73/219]
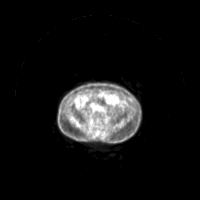
[im 146/219]
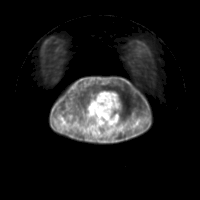
[im 219/219]
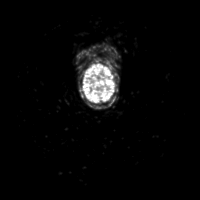

[Series 4: ct sk_thigh 5.0 b31f · axial · 5.0mm · 0.98mm/px · z∈[-880,-8]mm · 4 of 219 slices shown]
[im 1/219]
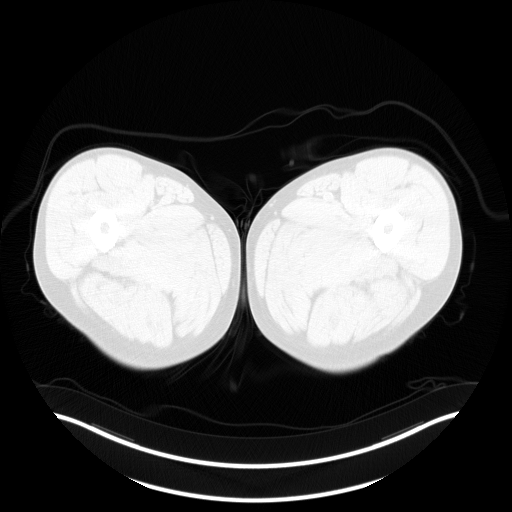
[im 73/219]
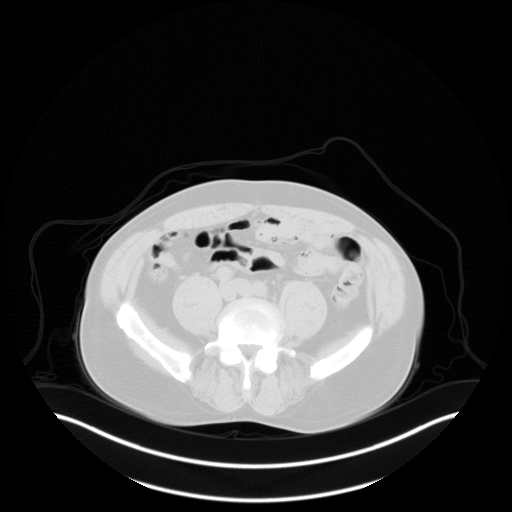
[im 146/219]
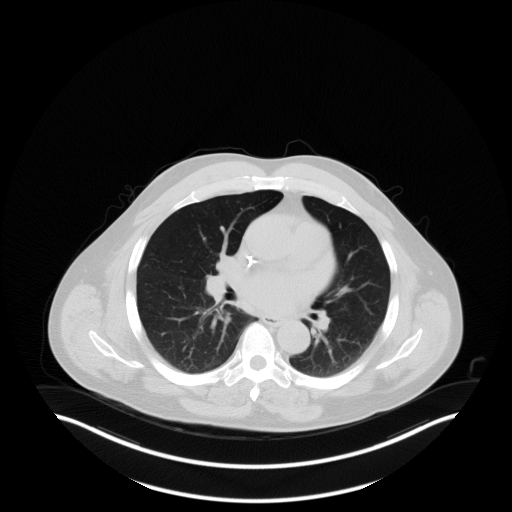
[im 219/219  brain]
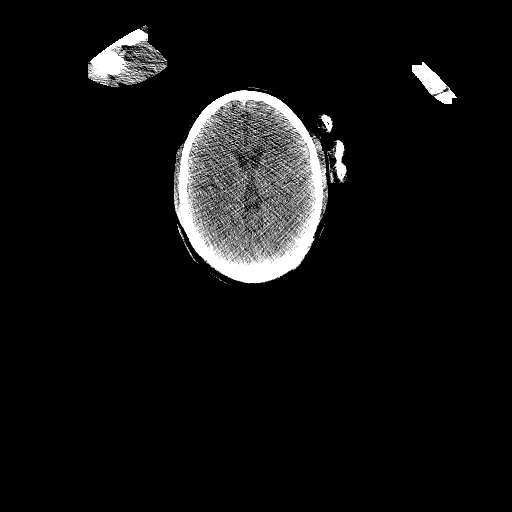

[Series 5: pet sk_thigh nac · axial · 5.0mm · 4.07mm/px · z∈[-880,-8]mm · 5 of 219 slices shown]
[im 1/219]
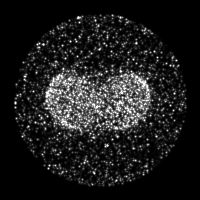
[im 55/219]
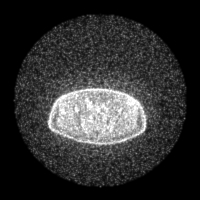
[im 110/219]
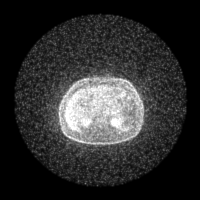
[im 164/219]
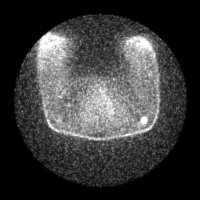
[im 219/219]
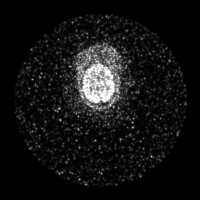

[Series 8: ct sk_thigh 5.0 b70f lung_bone · axial · 5.0mm · 0.71mm/px · z∈[-394,-122]mm · 2 of 69 slices shown]
[im 1/69  bone]
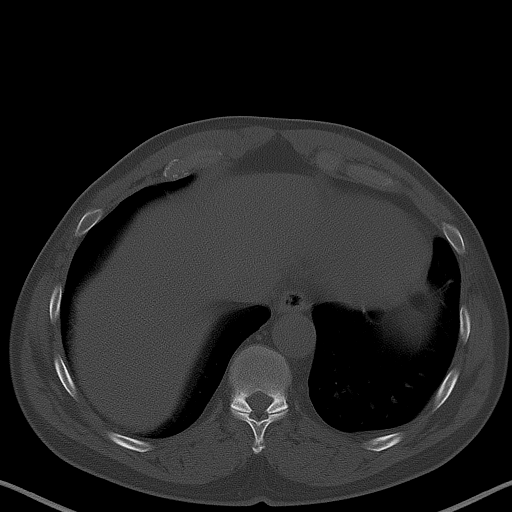
[im 69/69  bone]
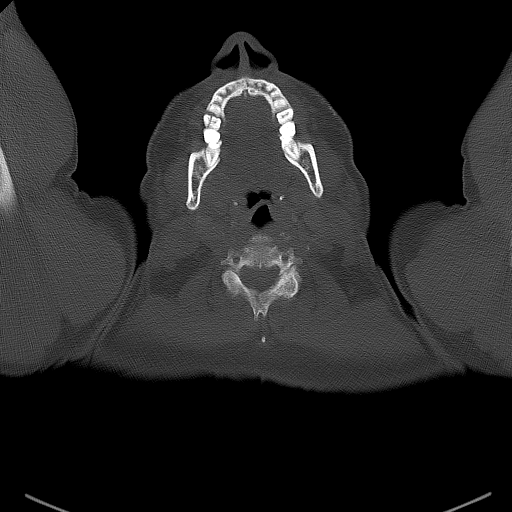

[Series 604: mip range 3 · coronal · 1.81mm/px · 1 of 32 slices shown]
[im 1/32]
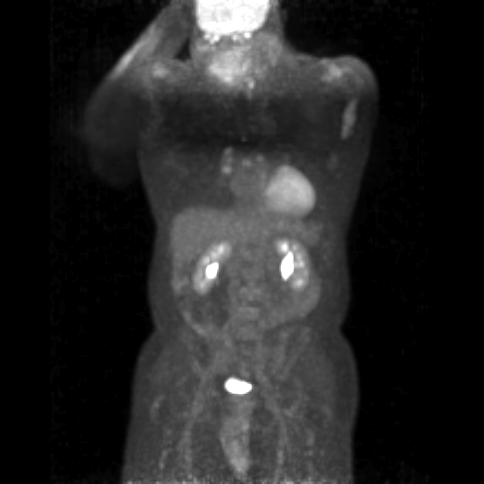

[Series 606: range-ct sk_thigh 5.0 (id)<alpha range(2)> · 2 of 83 slices shown]
[im 1/83]
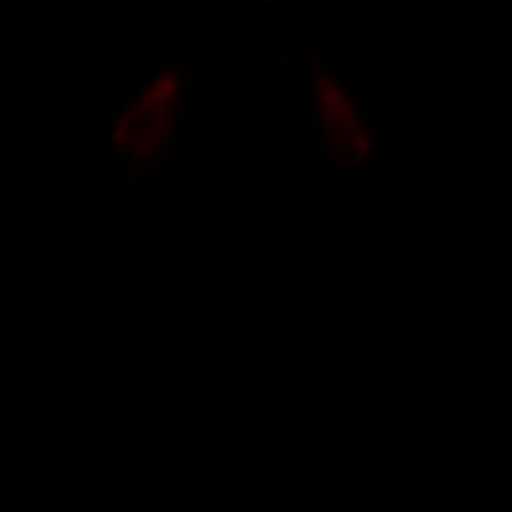
[im 83/83]
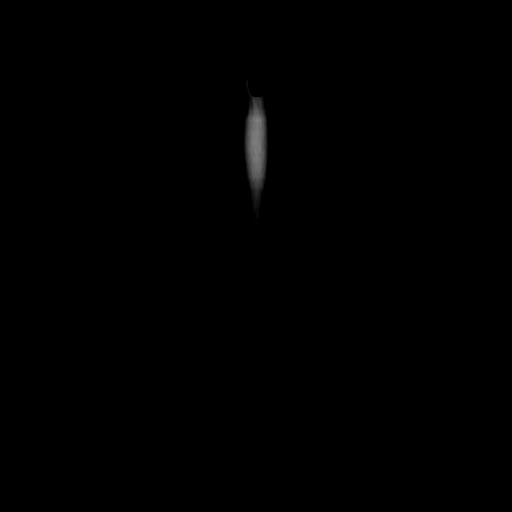

[Series 607: range-ct sk_thigh 5.0 (id)<alpha range> · 5 of 210 slices shown]
[im 1/210]
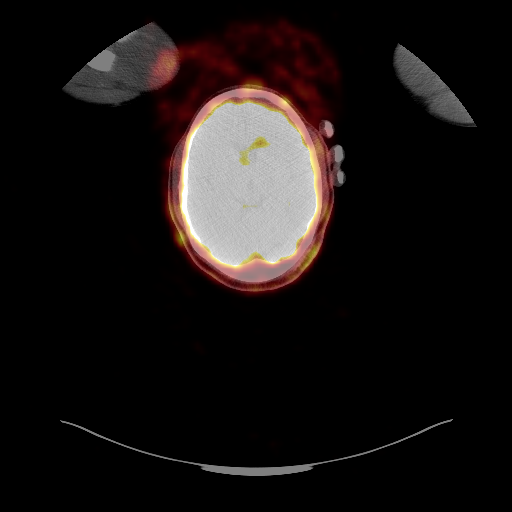
[im 53/210]
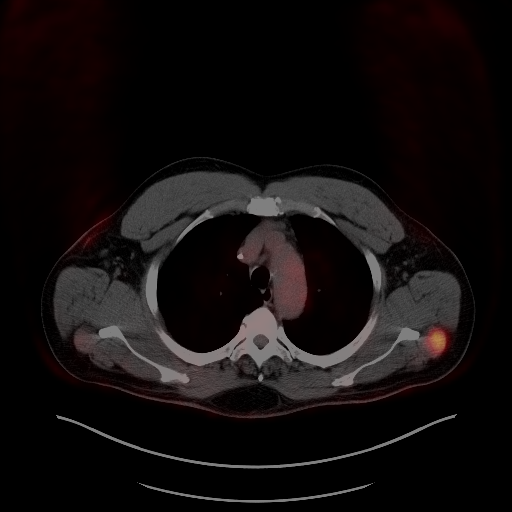
[im 105/210]
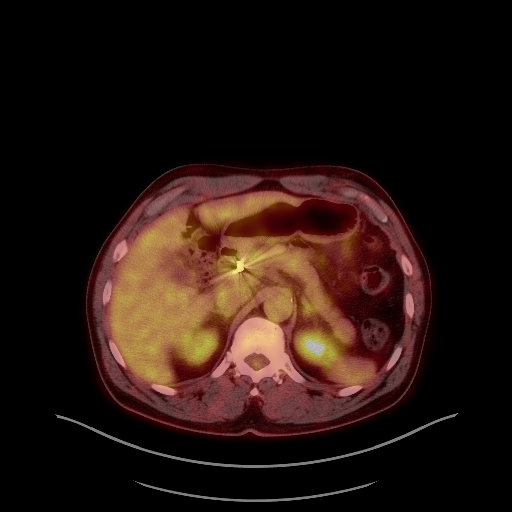
[im 157/210]
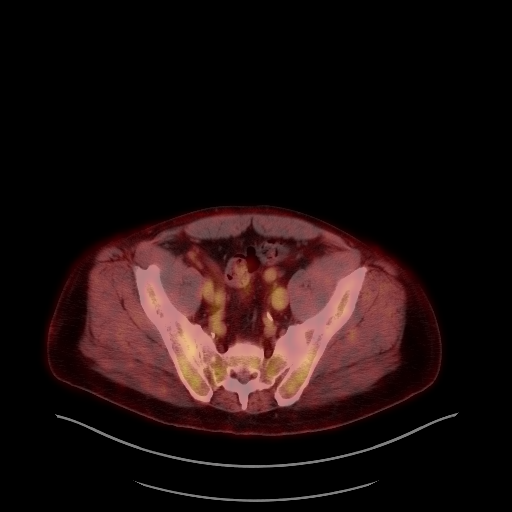
[im 210/210]
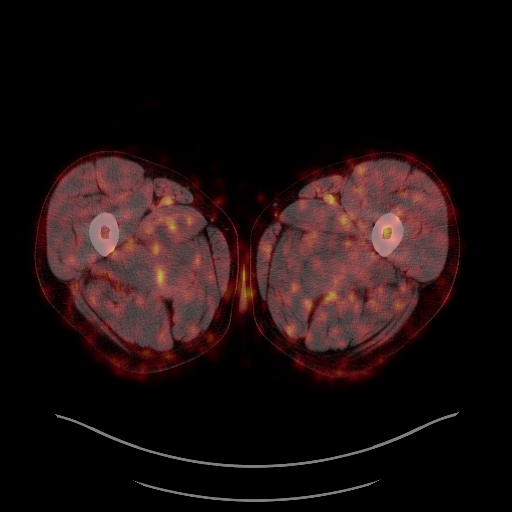

[Series 2907: results mm oncology reading · 1.0mm · 1.20mm/px · 1 of 6 slices shown (1 of 2)]
[im 1/6]
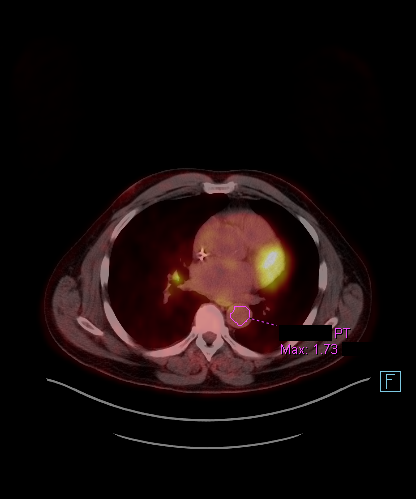

[Series 3269: results mm oncology reading · 5.0mm · 0.95mm/px · 1 of 1 slices shown (2 of 2)]
[im 1/1]
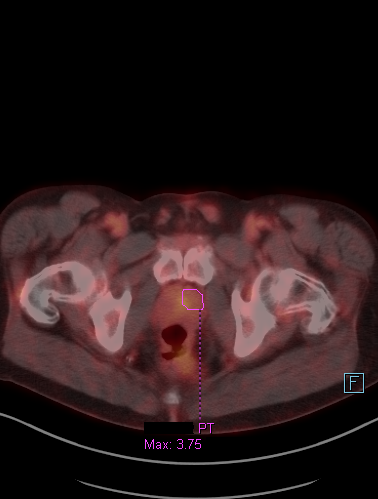

[25 of 25 positions shown; findings below may reference images not displayed]

FINDINGS: Mediastinal blood pool activity: SUV max

Liver activity: SUV max

NECK: No hypermetabolic cervical lymphadenopathy.

Incidental CT findings: none

CHEST: Evaluation of the superior mediastinum is limited due to
attenuation correction artifact related to patient's arm position
and motion.

Mildly hypermetabolic thoracic lymphadenopathy, including:

--Right hilar node, max SUV 2.4, previously

--Subcarinal node, max SUV 3.5, previously

--Right infrahilar/lower lobe node, max SUV 4.6, previously

No suspicious pulmonary nodules.

Right chest port terminates at the cavoatrial junction.

Incidental CT findings: Mild atherosclerotic calcifications of the
aortic arch.

Mild coronary atherosclerosis of the LAD and right circumflex.

ABDOMEN/PELVIS: No abnormal hypermetabolism in the liver, spleen,
pancreas, or adrenal glands.

Reference splenic activity 2.5. No splenomegaly or focal splenic
lesions.

Suspected GDA embolization coils. No abnormal hypermetabolism along
the duodenum.

No hypermetabolic abdominopelvic lymphadenopathy.

Incidental CT findings: Atherosclerotic calcifications the abdominal
aorta and branch vessels.

SKELETON: No focal hypermetabolic activity to suggest skeletal
metastasis.

Incidental CT findings: none
IMPRESSION: Partial metabolic response, with mild residual hypermetabolic
thoracic lymph nodes, as above.

[HOSPITAL] criteria 4.

## 2019-05-09 MED ORDER — FLUDEOXYGLUCOSE F - 18 (FDG) INJECTION
10.3600 | Freq: Once | INTRAVENOUS | Status: AC
  Administered 2019-05-09: 10.36 via INTRAVENOUS

## 2019-05-10 ENCOUNTER — Encounter: Payer: Self-pay | Admitting: Oncology

## 2019-05-17 ENCOUNTER — Other Ambulatory Visit: Payer: Self-pay | Admitting: *Deleted

## 2019-05-17 ENCOUNTER — Telehealth: Payer: Self-pay | Admitting: *Deleted

## 2019-05-17 MED ORDER — IMBRUVICA 420 MG PO TABS: 1.0000 | ORAL_TABLET | Freq: Every day | ORAL | 0 refills | Status: DC

## 2019-05-17 NOTE — Telephone Encounter (Signed)
Received vm from wife stating that pt is due to refill his Imbrutinib & wonders if dose can be reduced.  Returned call & talked with pt. He states that Dr Jana Hakim discussed at last visit possibility of decreasing dose depending on scans.  PET scan was done & pt did not do MRI Brain due to claustrophobia. Message to Dr Jana Hakim.  Pt states he is OK if Dr Jana Hakim doesn't think he should reduce dose.   Called pt's wife & informed Dr Jana Hakim OK with decreasing dose.  Script sent to his pharmacy.

## 2019-05-19 MED FILL — IMBRUVICA 420 MG TAB: 420 | 28 days supply | Qty: 28 | Fill #0

## 2019-05-23 ENCOUNTER — Other Ambulatory Visit: Payer: Self-pay | Admitting: Oncology

## 2019-06-14 ENCOUNTER — Other Ambulatory Visit: Payer: Self-pay | Admitting: Oncology

## 2019-07-07 ENCOUNTER — Other Ambulatory Visit: Payer: Self-pay | Admitting: Oncology

## 2019-07-11 ENCOUNTER — Other Ambulatory Visit: Payer: Self-pay | Admitting: Oncology

## 2019-07-13 ENCOUNTER — Telehealth: Payer: Self-pay

## 2019-07-13 NOTE — Telephone Encounter (Signed)
Oral Oncology Patient Advocate Encounter  Was successful in securing patient a $8500 grant from [employer] to provide copayment coverage for Imbruvica.  This will keep the out of pocket expense at $0.     [employer] ID: [id number]  I have spoken with the patient.   The billing information is as follows and has been shared with [place].    RxBin: [id number] PCN: [id number] Member ID: [id number] Group ID: [id number] Dates of Eligibility: 06/13/19 through 06/11/20  Fund:  [place] [facility] [place] Phone [phone] Fax [phone] 07/13/2019 2:49 PM

## 2019-07-28 ENCOUNTER — Other Ambulatory Visit: Payer: Self-pay

## 2019-07-28 ENCOUNTER — Inpatient Hospital Stay: Payer: Medicare HMO | Attending: Oncology

## 2019-07-28 ENCOUNTER — Inpatient Hospital Stay: Payer: Medicare HMO

## 2019-07-28 DIAGNOSIS — C8319 Mantle cell lymphoma, extranodal and solid organ sites: Secondary | ICD-10-CM

## 2019-07-28 DIAGNOSIS — R1900 Intra-abdominal and pelvic swelling, mass and lump, unspecified site: Secondary | ICD-10-CM

## 2019-07-28 DIAGNOSIS — K276 Chronic or unspecified peptic ulcer, site unspecified, with both hemorrhage and perforation: Secondary | ICD-10-CM

## 2019-07-28 DIAGNOSIS — Z452 Encounter for adjustment and management of vascular access device: Secondary | ICD-10-CM | POA: Insufficient documentation

## 2019-07-28 LAB — CBC WITH DIFFERENTIAL/PLATELET
Abs Immature Granulocytes: 0.01 10*3/uL (ref 0.00–0.07)
Basophils Absolute: 0 10*3/uL (ref 0.0–0.1)
Basophils Relative: 1 %
Eosinophils Absolute: 0 10*3/uL (ref 0.0–0.5)
Eosinophils Relative: 1 %
HCT: 39 % (ref 39.0–52.0)
Hemoglobin: 12.6 g/dL — ABNORMAL LOW (ref 13.0–17.0)
Immature Granulocytes: 0 %
Lymphocytes Relative: 27 %
Lymphs Abs: 1.2 10*3/uL (ref 0.7–4.0)
MCH: 28.1 pg (ref 26.0–34.0)
MCHC: 32.3 g/dL (ref 30.0–36.0)
MCV: 87.1 fL (ref 80.0–100.0)
Monocytes Absolute: 0.9 10*3/uL (ref 0.1–1.0)
Monocytes Relative: 20 %
Neutro Abs: 2.3 10*3/uL (ref 1.7–7.7)
Neutrophils Relative %: 51 %
Platelets: 299 10*3/uL (ref 150–400)
RBC: 4.48 MIL/uL (ref 4.22–5.81)
RDW: 13.7 % (ref 11.5–15.5)
WBC: 4.4 10*3/uL (ref 4.0–10.5)
nRBC: 0 % (ref 0.0–0.2)

## 2019-07-28 LAB — COMPREHENSIVE METABOLIC PANEL
ALT: 16 U/L (ref 0–44)
AST: 17 U/L (ref 15–41)
Albumin: 4 g/dL (ref 3.5–5.0)
Alkaline Phosphatase: 36 U/L — ABNORMAL LOW (ref 38–126)
Anion gap: 9 (ref 5–15)
BUN: 24 mg/dL — ABNORMAL HIGH (ref 8–23)
CO2: 26 mmol/L (ref 22–32)
Calcium: 8.9 mg/dL (ref 8.9–10.3)
Chloride: 102 mmol/L (ref 98–111)
Creatinine, Ser: 1.3 mg/dL — ABNORMAL HIGH (ref 0.61–1.24)
GFR calc Af Amer: >60 mL/min (ref >60)
GFR calc non Af Amer: 57 mL/min — ABNORMAL LOW (ref >60)
Glucose, Bld: 94 mg/dL (ref 70–99)
Potassium: 3.9 mmol/L (ref 3.5–5.1)
Sodium: 137 mmol/L (ref 135–145)
Total Bilirubin: 0.8 mg/dL (ref 0.3–1.2)
Total Protein: 6.8 g/dL (ref 6.5–8.1)

## 2019-07-28 LAB — LACTATE DEHYDROGENASE: LDH: 233 U/L — ABNORMAL HIGH (ref 98–192)

## 2019-07-28 MED ORDER — SODIUM CHLORIDE 0.9% FLUSH
10.0000 mL | INTRAVENOUS | Status: DC | PRN
  Administered 2019-07-28: 10 mL via INTRAVENOUS
  Filled 2019-07-28: qty 10

## 2019-07-28 MED ORDER — HEPARIN SOD (PORK) LOCK FLUSH 100 UNIT/ML IV SOLN
500.0000 [IU] | Freq: Once | INTRAVENOUS | Status: AC
  Administered 2019-07-28: 500 [IU] via INTRAVENOUS
  Filled 2019-07-28: qty 5

## 2019-07-29 LAB — BETA 2 MICROGLOBULIN, SERUM: Beta-2 Microglobulin: 1.3 mg/L (ref 0.6–2.4)

## 2019-08-01 ENCOUNTER — Observation Stay (HOSPITAL_COMMUNITY): Payer: Medicare HMO

## 2019-08-01 ENCOUNTER — Observation Stay (HOSPITAL_BASED_OUTPATIENT_CLINIC_OR_DEPARTMENT_OTHER): Payer: Medicare HMO

## 2019-08-01 ENCOUNTER — Emergency Department (HOSPITAL_COMMUNITY): Payer: Medicare HMO

## 2019-08-01 ENCOUNTER — Encounter (HOSPITAL_COMMUNITY): Payer: Self-pay

## 2019-08-01 ENCOUNTER — Other Ambulatory Visit: Payer: Self-pay

## 2019-08-01 ENCOUNTER — Observation Stay (HOSPITAL_COMMUNITY)
Admission: EM | Admit: 2019-08-01 | Discharge: 2019-08-03 | Disposition: A | Discharge status: Home / Self Care | Payer: Medicare HMO | Attending: Internal Medicine | Admitting: Internal Medicine

## 2019-08-01 DIAGNOSIS — C8319 Mantle cell lymphoma, extranodal and solid organ sites: Secondary | ICD-10-CM | POA: Insufficient documentation

## 2019-08-01 DIAGNOSIS — G459 Transient cerebral ischemic attack, unspecified: Principal | ICD-10-CM | POA: Insufficient documentation

## 2019-08-01 DIAGNOSIS — R008 Other abnormalities of heart beat: Secondary | ICD-10-CM | POA: Diagnosis not present

## 2019-08-01 DIAGNOSIS — Z79899 Other long term (current) drug therapy: Secondary | ICD-10-CM | POA: Diagnosis not present

## 2019-08-01 DIAGNOSIS — Z20822 Contact with and (suspected) exposure to covid-19: Secondary | ICD-10-CM | POA: Diagnosis not present

## 2019-08-01 DIAGNOSIS — C8590 Non-Hodgkin lymphoma, unspecified, unspecified site: Secondary | ICD-10-CM

## 2019-08-01 DIAGNOSIS — I10 Essential (primary) hypertension: Secondary | ICD-10-CM | POA: Insufficient documentation

## 2019-08-01 DIAGNOSIS — R200 Anesthesia of skin: Secondary | ICD-10-CM | POA: Diagnosis present

## 2019-08-01 DIAGNOSIS — N1831 Chronic kidney disease, stage 3a: Secondary | ICD-10-CM | POA: Diagnosis not present

## 2019-08-01 DIAGNOSIS — K219 Gastro-esophageal reflux disease without esophagitis: Secondary | ICD-10-CM | POA: Diagnosis not present

## 2019-08-01 DIAGNOSIS — E785 Hyperlipidemia, unspecified: Secondary | ICD-10-CM | POA: Diagnosis not present

## 2019-08-01 DIAGNOSIS — I6302 Cerebral infarction due to thrombosis of basilar artery: Secondary | ICD-10-CM

## 2019-08-01 DIAGNOSIS — I6782 Cerebral ischemia: Secondary | ICD-10-CM | POA: Diagnosis not present

## 2019-08-01 DIAGNOSIS — R202 Paresthesia of skin: Secondary | ICD-10-CM | POA: Diagnosis not present

## 2019-08-01 DIAGNOSIS — I639 Cerebral infarction, unspecified: Secondary | ICD-10-CM | POA: Diagnosis not present

## 2019-08-01 DIAGNOSIS — R41841 Cognitive communication deficit: Secondary | ICD-10-CM | POA: Insufficient documentation

## 2019-08-01 DIAGNOSIS — I6389 Other cerebral infarction: Secondary | ICD-10-CM | POA: Diagnosis not present

## 2019-08-01 DIAGNOSIS — Z03818 Encounter for observation for suspected exposure to other biological agents ruled out: Secondary | ICD-10-CM | POA: Diagnosis not present

## 2019-08-01 DIAGNOSIS — I491 Atrial premature depolarization: Secondary | ICD-10-CM | POA: Insufficient documentation

## 2019-08-01 DIAGNOSIS — G319 Degenerative disease of nervous system, unspecified: Secondary | ICD-10-CM | POA: Diagnosis not present

## 2019-08-01 LAB — CBC
HCT: 40.5 % (ref 39.0–52.0)
Hemoglobin: 13.2 g/dL (ref 13.0–17.0)
MCH: 28.3 pg (ref 26.0–34.0)
MCHC: 32.6 g/dL (ref 30.0–36.0)
MCV: 86.9 fL (ref 80.0–100.0)
Platelets: 301 10*3/uL (ref 150–400)
RBC: 4.66 MIL/uL (ref 4.22–5.81)
RDW: 13.6 % (ref 11.5–15.5)
WBC: 4.6 10*3/uL (ref 4.0–10.5)
nRBC: 0 % (ref 0.0–0.2)

## 2019-08-01 LAB — COMPREHENSIVE METABOLIC PANEL
ALT: 18 U/L (ref 0–44)
AST: 21 U/L (ref 15–41)
Albumin: 4.9 g/dL (ref 3.5–5.0)
Alkaline Phosphatase: 35 U/L — ABNORMAL LOW (ref 38–126)
Anion gap: 13 (ref 5–15)
BUN: 23 mg/dL (ref 8–23)
CO2: 25 mmol/L (ref 22–32)
Calcium: 9.3 mg/dL (ref 8.9–10.3)
Chloride: 101 mmol/L (ref 98–111)
Creatinine, Ser: 1.33 mg/dL — ABNORMAL HIGH (ref 0.61–1.24)
GFR calc Af Amer: >60 mL/min (ref >60)
GFR calc non Af Amer: 55 mL/min — ABNORMAL LOW (ref >60)
Glucose, Bld: 98 mg/dL (ref 70–99)
Potassium: 3.6 mmol/L (ref 3.5–5.1)
Sodium: 139 mmol/L (ref 135–145)
Total Bilirubin: 1 mg/dL (ref 0.3–1.2)
Total Protein: 7.4 g/dL (ref 6.5–8.1)

## 2019-08-01 LAB — URINALYSIS, ROUTINE W REFLEX MICROSCOPIC
Bilirubin Urine: NEGATIVE
Glucose, UA: NEGATIVE mg/dL
Hgb urine dipstick: NEGATIVE
Ketones, ur: NEGATIVE mg/dL
Leukocytes,Ua: NEGATIVE
Nitrite: NEGATIVE
Protein, ur: NEGATIVE mg/dL
Specific Gravity, Urine: 1.008 (ref 1.005–1.030)
pH: 7 (ref 5.0–8.0)

## 2019-08-01 LAB — DIFFERENTIAL
Abs Immature Granulocytes: 0.02 10*3/uL (ref 0.00–0.07)
Basophils Absolute: 0 10*3/uL (ref 0.0–0.1)
Basophils Relative: 0 %
Eosinophils Absolute: 0 10*3/uL (ref 0.0–0.5)
Eosinophils Relative: 0 %
Immature Granulocytes: 0 %
Lymphocytes Relative: 22 %
Lymphs Abs: 1 10*3/uL (ref 0.7–4.0)
Monocytes Absolute: 0.7 10*3/uL (ref 0.1–1.0)
Monocytes Relative: 14 %
Neutro Abs: 2.9 10*3/uL (ref 1.7–7.7)
Neutrophils Relative %: 64 %

## 2019-08-01 LAB — HEMOGLOBIN A1C
Hgb A1c MFr Bld: 6.1 % — ABNORMAL HIGH (ref 4.8–5.6)
Mean Plasma Glucose: 128.37 mg/dL

## 2019-08-01 LAB — LIPID PANEL
Cholesterol: 227 mg/dL — ABNORMAL HIGH (ref 0–200)
HDL: 54 mg/dL (ref >40)
LDL Cholesterol: 155 mg/dL — ABNORMAL HIGH (ref 0–99)
Total CHOL/HDL Ratio: 4.2 RATIO
Triglycerides: 91 mg/dL (ref <150)
VLDL: 18 mg/dL (ref 0–40)

## 2019-08-01 LAB — I-STAT CHEM 8, ED
BUN: 22 mg/dL (ref 8–23)
Calcium, Ion: 1.18 mmol/L (ref 1.15–1.40)
Chloride: 100 mmol/L (ref 98–111)
Creatinine, Ser: 1.3 mg/dL — ABNORMAL HIGH (ref 0.61–1.24)
Glucose, Bld: 95 mg/dL (ref 70–99)
HCT: 41 % (ref 39.0–52.0)
Hemoglobin: 13.9 g/dL (ref 13.0–17.0)
Potassium: 3.5 mmol/L (ref 3.5–5.1)
Sodium: 139 mmol/L (ref 135–145)
TCO2: 23 mmol/L (ref 22–32)

## 2019-08-01 LAB — SARS CORONAVIRUS 2 BY RT PCR (HOSPITAL ORDER, PERFORMED IN ~~LOC~~ HOSPITAL LAB): SARS Coronavirus 2: NEGATIVE

## 2019-08-01 LAB — PROTIME-INR
INR: 0.9 (ref 0.8–1.2)
Prothrombin Time: 11.8 seconds (ref 11.4–15.2)

## 2019-08-01 LAB — CBG MONITORING, ED: Glucose-Capillary: 94 mg/dL (ref 70–99)

## 2019-08-01 LAB — RAPID URINE DRUG SCREEN, HOSP PERFORMED
Amphetamines: NOT DETECTED
Barbiturates: NOT DETECTED
Benzodiazepines: NOT DETECTED
Cocaine: NOT DETECTED
Opiates: NOT DETECTED
Tetrahydrocannabinol: NOT DETECTED

## 2019-08-01 LAB — APTT: aPTT: 27 seconds (ref 24–36)

## 2019-08-01 LAB — ETHANOL: Alcohol, Ethyl (B): <10 mg/dL (ref <10)

## 2019-08-01 IMAGING — CT CT HEAD CODE STROKE
2 of 3 series · 14 of 45 positions shown, 17 images · non-contrast
Comparison: PET-CT [DATE], CT head [DATE].

CLINICAL DATA: Code stroke. Neuro deficit, acute, stroke suspected.
Additional history provided: Weight lifter, began to experience
tingling of the left side of head and left arm around 8 a.m. today,
patient reports feeling off balance, patient currently taking oral
chemotherapy, history of mantle cell lymphoma.

EXAM:
CT HEAD WITHOUT CONTRAST
TECHNIQUE: Contiguous axial images were obtained from the base of the skull
through the vertex without intravenous contrast.

[Series 2: head wo · axial · 0.47mm/px · z∈[+1463,+1578]mm · 11 of 28 slices shown, 14 images]
[im 3/28  brain]
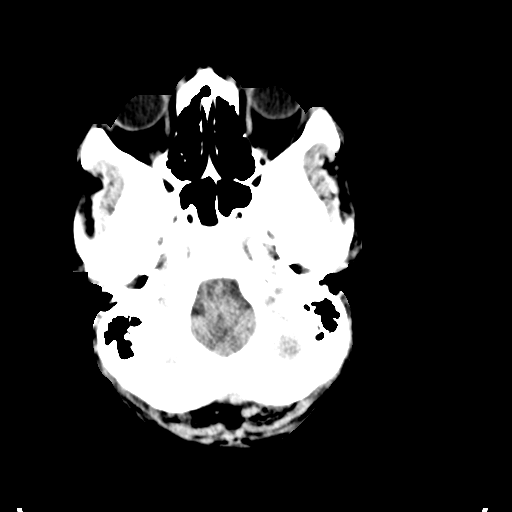
[im 3/28  bone]
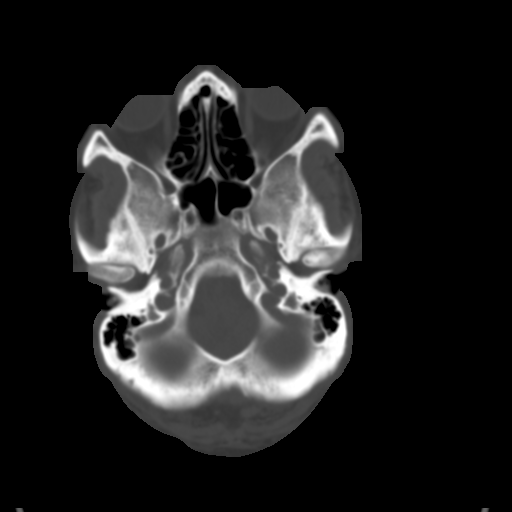
[im 5/28  brain]
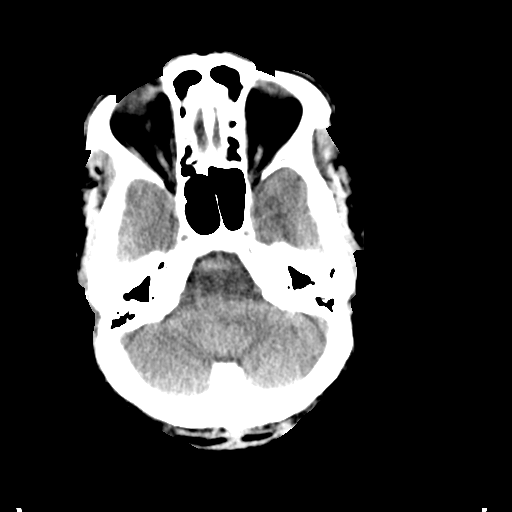
[im 7/28  brain]
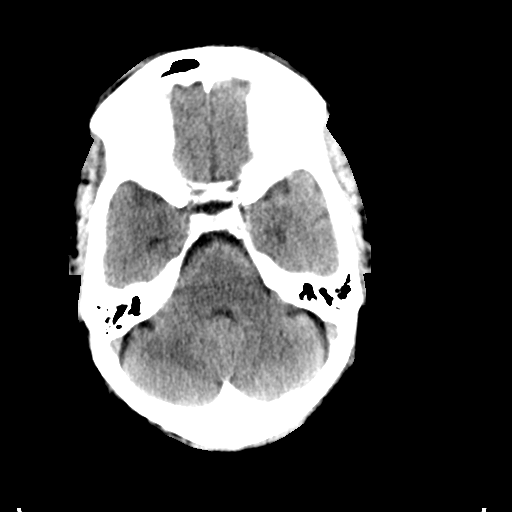
[im 10/28  brain]
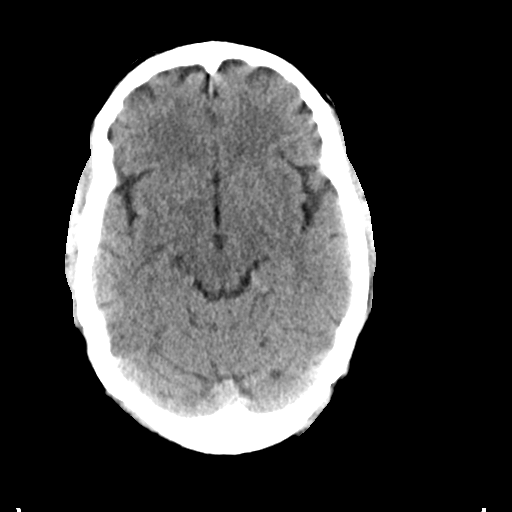
[im 12/28  brain]
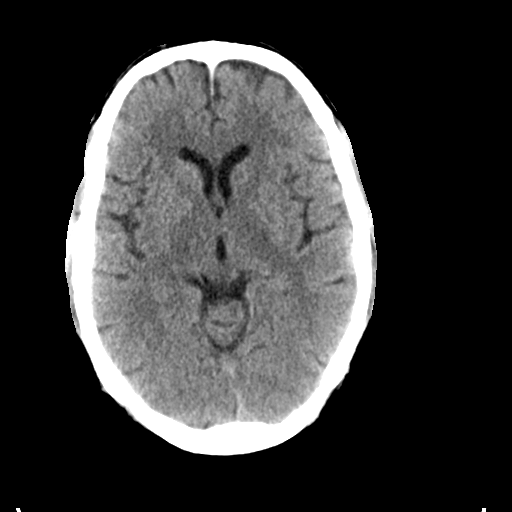
[im 12/28  bone]
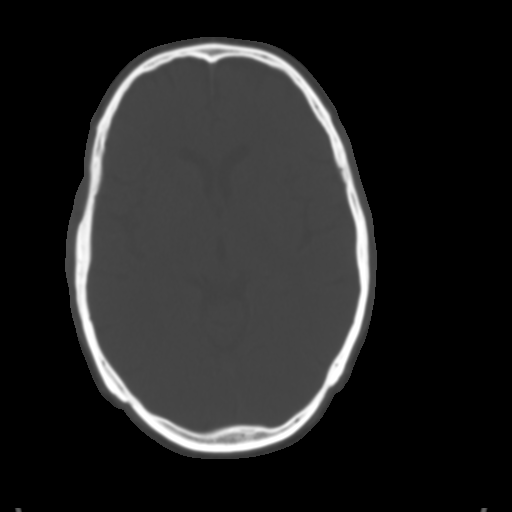
[im 15/28  brain]
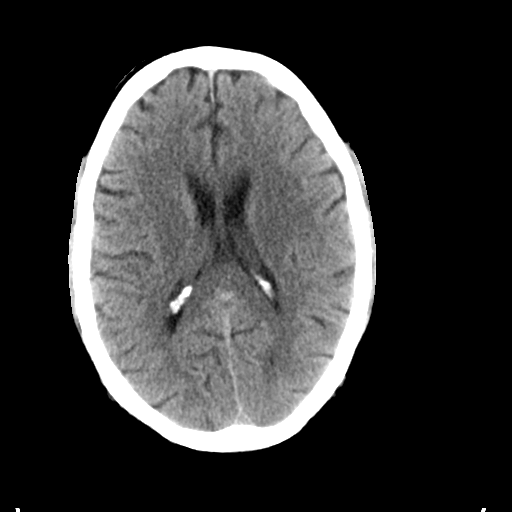
[im 17/28  brain]
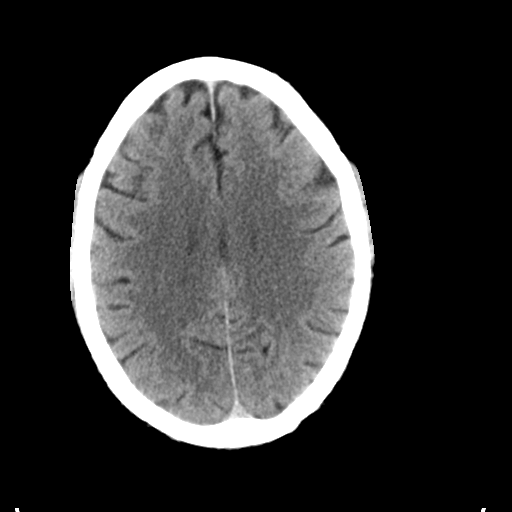
[im 19/28  brain]
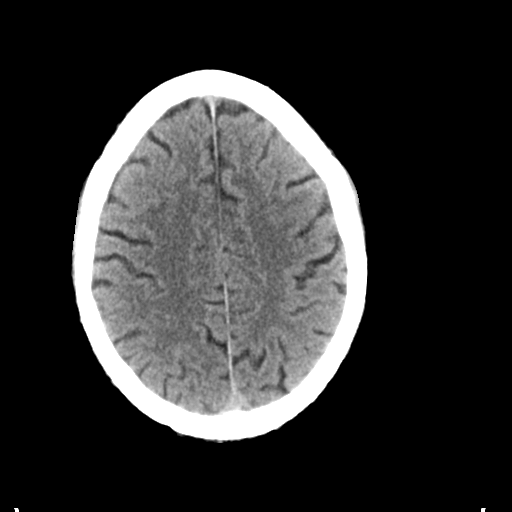
[im 22/28  brain]
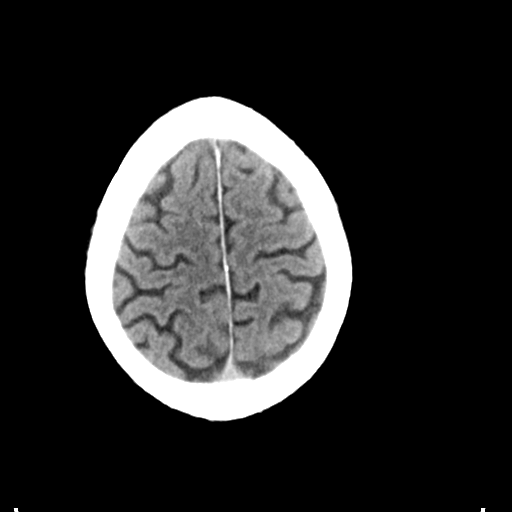
[im 22/28  bone]
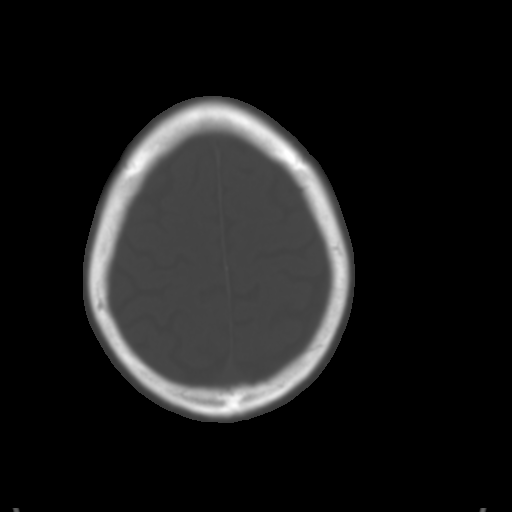
[im 24/28  brain]
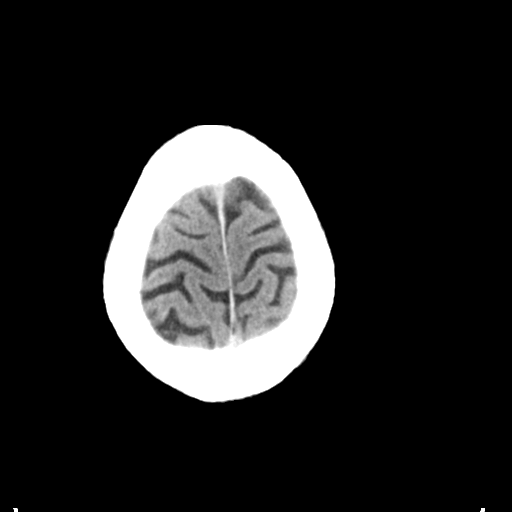
[im 26/28  brain]
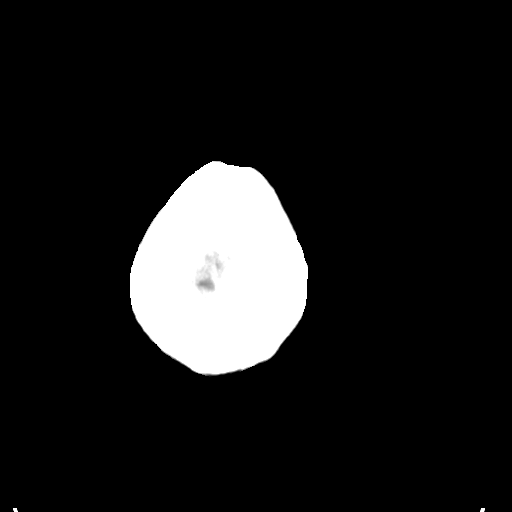

[Series 4: coronal soft tissue · coronal · 0.37mm/px · 3 of 70 slices shown]
[im 24/70  brain]
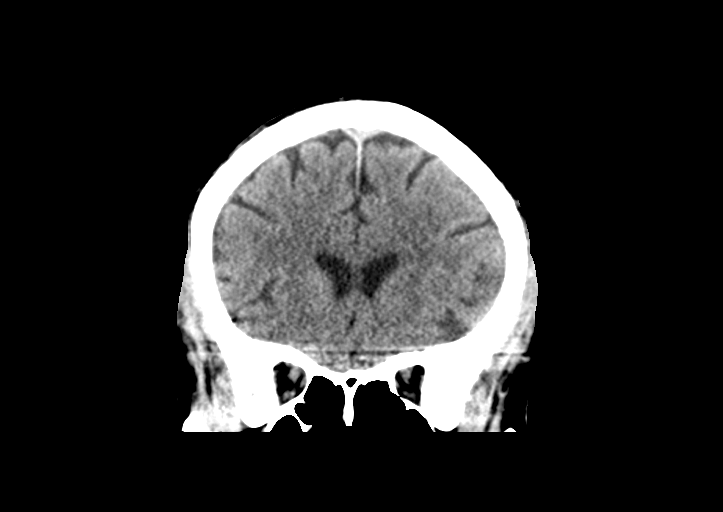
[im 31/70  brain]
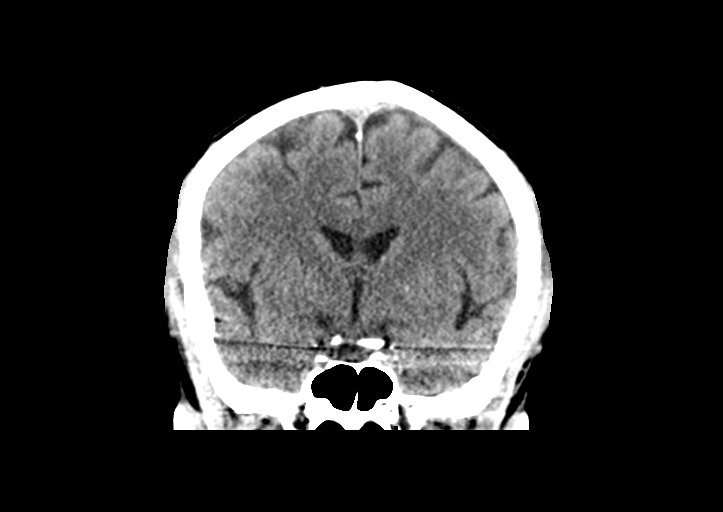
[im 39/70  brain]
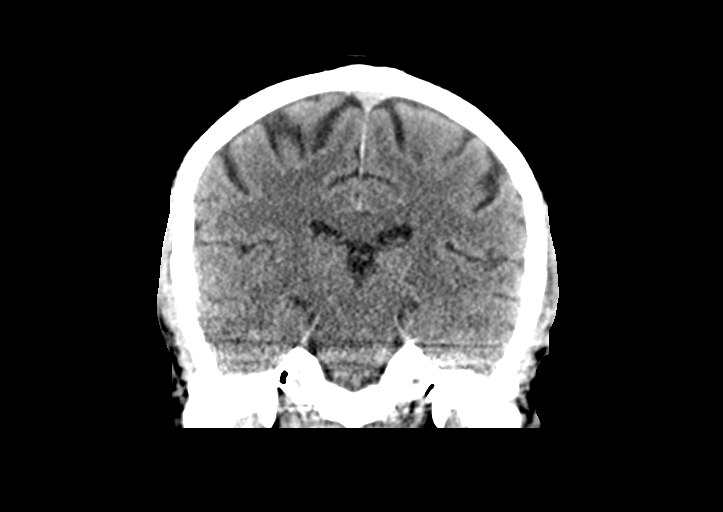

[14 of 45 positions shown; findings below may reference images not displayed]

FINDINGS: Brain:

There is limited assessment for intracranial manifestations of
lymphoma on this noncontrast head CT.

Stable, mild generalized parenchymal atrophy.

There is a small cortically based ischemic infarct within the mid
right frontal lobe (series 12, image 21) (series 4, image 30). This
is new from prior head CT [DATE] and age-indeterminate, although
favored chronic given appearance.

There is a small lacunar infarct within the superior left cerebellum
which appears chronic and in retrospect was likely present on prior
head CT [DATE].

Minimal ill-defined hypoattenuation within the cerebral white matter
is nonspecific, but consistent with chronic small vessel ischemic
disease.

There is no acute intracranial hemorrhage.

No extra-axial fluid collection.

No evidence of intracranial mass.

No midline shift.

Vascular: No hyperdense vessel.  Atherosclerotic calcifications.

Skull: Normal. Negative for fracture or focal lesion.

Sinuses/Orbits: Visualized orbits show no acute finding. No
significant paranasal sinus disease or mastoid effusion at the
imaged levels.

ASPECTS (Alberta Stroke Program Early CT Score)

- Ganglionic level infarction (caudate, lentiform nuclei, internal
capsule, insula, M1-M3 cortex): 7

- Supraganglionic infarction (M4-M6 cortex): 2

Total score (0-10 with 10 being normal): 10

These results were called by telephone at the time of interpretation
on [DATE] at [DATE] to provider THAKURGAON , who verbally
acknowledged these results.
IMPRESSION: 1. A small cortical infarct within the mid right frontal lobe is
age-indeterminate, but favored chronic given appearance. ASPECTS is
9. Consider brain MRI for further evaluation.
2. Small infarct within the superior left cerebellar hemisphere,
which appears chronic and in retrospect, was likely present on prior
head CT [DATE].
[DATE]. Stable mild generalized parenchymal atrophy and chronic small
vessel ischemic disease.
4. Please note there is limited assessment for intracranial
manifestations of lymphoma on this non-contrast head CT.

## 2019-08-01 IMAGING — MR MR HEAD WO/W CM
12 of 15 series · 30 of 48 positions shown · IV contrast (gadavist)
Comparison: Prior CT from [DATE].

CLINICAL DATA: Initial evaluation for left-sided paresthesias, gait
imbalance. History of lymphoma.

EXAM:
MRI HEAD WITHOUT AND WITH CONTRAST
MRA HEAD WITHOUT CONTRAST
TECHNIQUE: Multiplanar, multiecho pulse sequences of the brain and surrounding
structures were obtained without and with intravenous contrast.
Angiographic images of the head were obtained using MRA technique
without contrast.
CONTRAST:  10mL GADAVIST GADOBUTROL 1 MMOL/ML IV SOLN

[Series 5: DWI · axial · 3.0mm · 0.88mm/px · z∈[-79,+66]mm · 5 of 100 slices shown (1 of 4)]
[im 1/100]
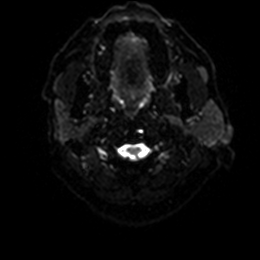
[im 25/100]
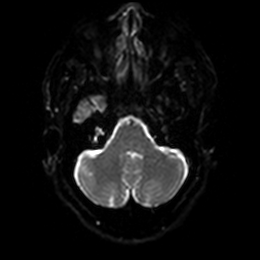
[im 50/100]
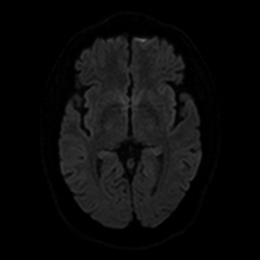
[im 75/100]
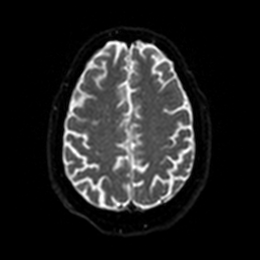
[im 100/100]
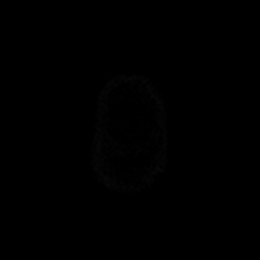

[Series 6: DWI · axial · 3.0mm · 0.88mm/px · z∈[-79,+66]mm · 2 of 50 slices shown (2 of 4)]
[im 1/50]
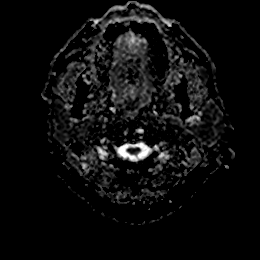
[im 50/50]
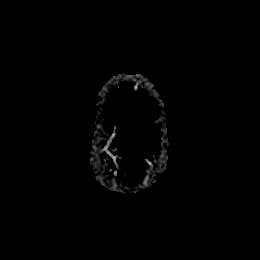

[Series 7: DWI · coronal · 4.0mm · 0.88mm/px · 4 of 68 slices shown (3 of 4)]
[im 1/68]
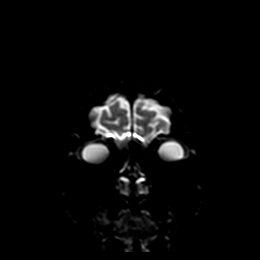
[im 23/68]
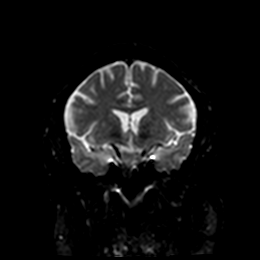
[im 45/68]
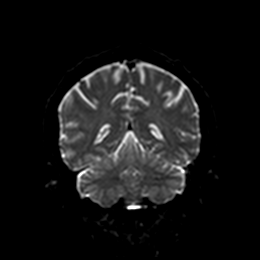
[im 68/68]
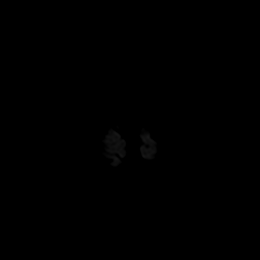

[Series 8: DWI · coronal · 4.0mm · 0.88mm/px · 2 of 34 slices shown (4 of 4)]
[im 1/34]
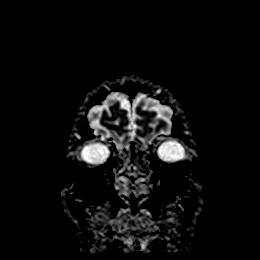
[im 34/34]
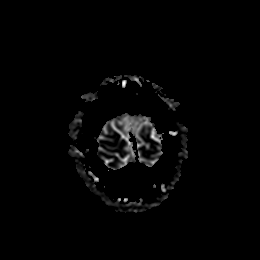

[Series 9: T1 · sagittal · 5.0mm · 0.78mm/px · 1 of 25 slices shown]
[im 1/25]
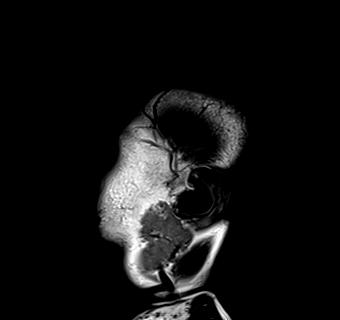

[Series 10: T2 · axial · 5.0mm · 0.72mm/px · z∈[-77,+77]mm · 2 of 27 slices shown]
[im 1/27]
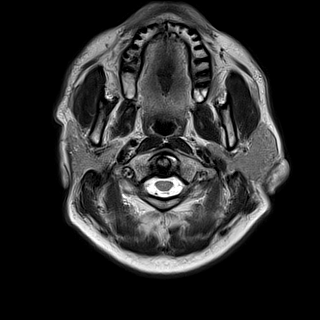
[im 27/27]
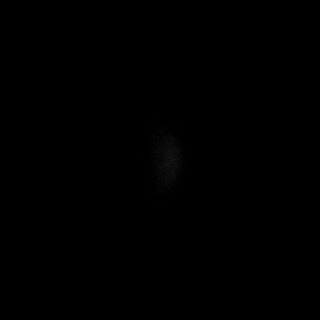

[Series 11: FLAIR · axial · 5.0mm · 0.45mm/px · z∈[-78,+76]mm · 2 of 27 slices shown]
[im 1/27]
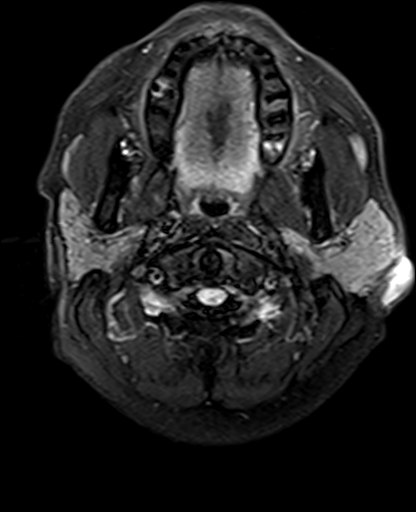
[im 27/27]
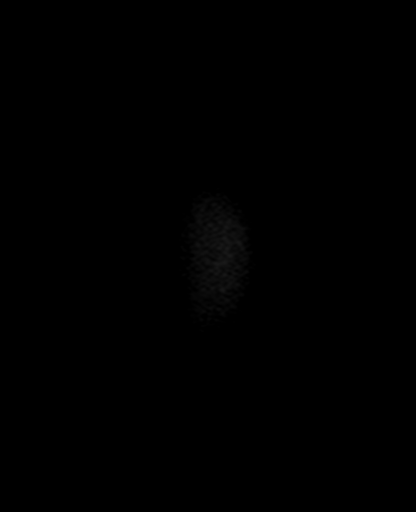

[Series 17: pha_images · axial · 3.0mm · 0.90mm/px · z∈[-89,+80]mm · 3 of 58 slices shown]
[im 1/58]
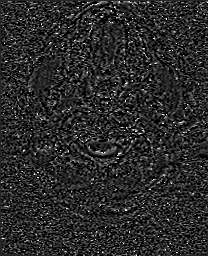
[im 29/58]
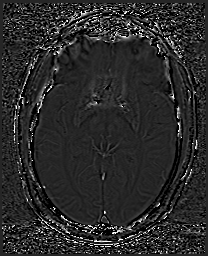
[im 58/58]
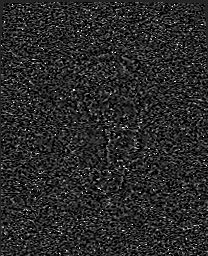

[Series 18: swi_images · axial · 3.0mm · 0.90mm/px · z∈[-89,+86]mm · 4 of 60 slices shown]
[im 1/60]
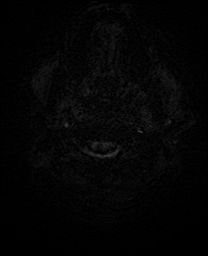
[im 20/60]
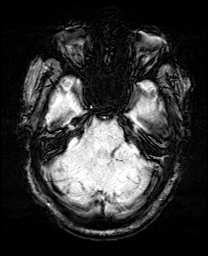
[im 40/60]
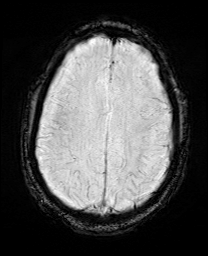
[im 60/60]
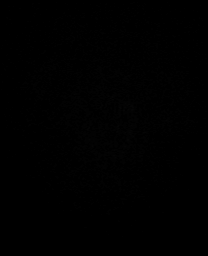

[Series 21: T2 post-contrast · coronal · 5.0mm · 0.72mm/px · 2 of 28 slices shown]
[im 1/28]
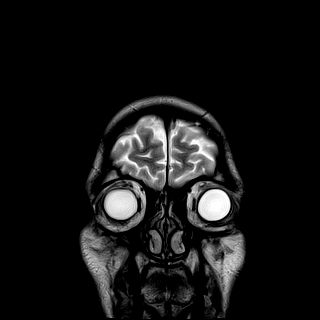
[im 28/28]
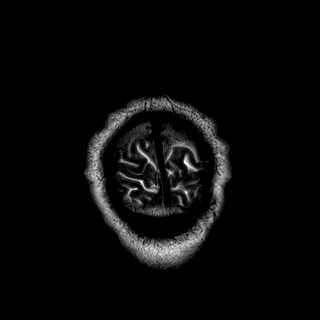

[Series 23: T1 post-contrast · coronal · 5.0mm · 0.34mm/px · 2 of 28 slices shown (1 of 2)]
[im 1/28]
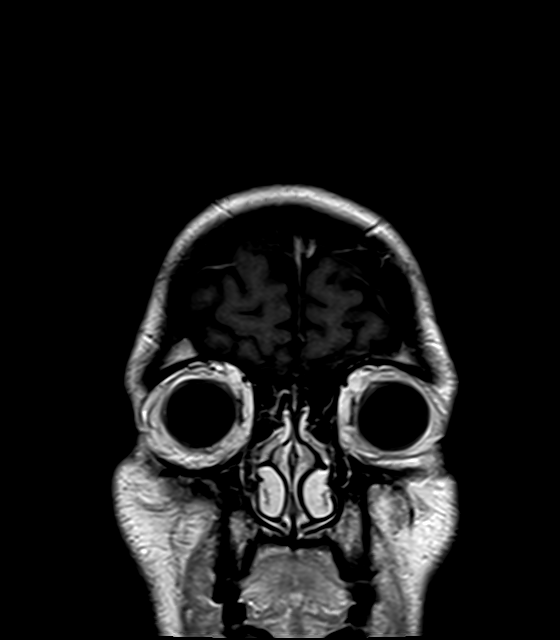
[im 28/28]
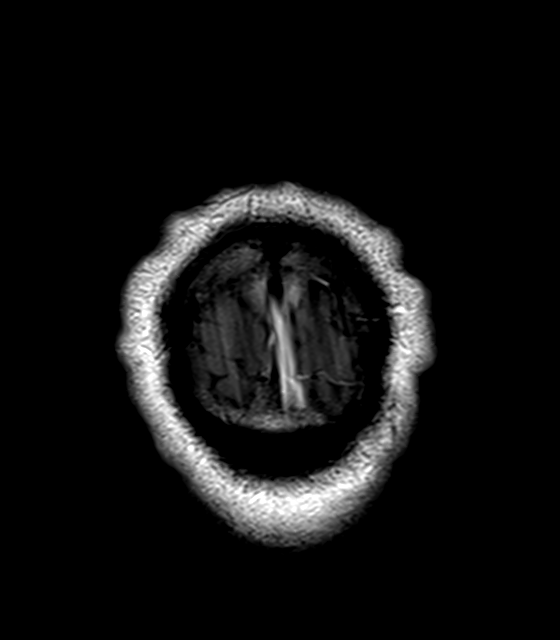

[Series 24: T1 post-contrast · sagittal · 5.0mm · 0.72mm/px · 1 of 25 slices shown (2 of 2)]
[im 1/25]
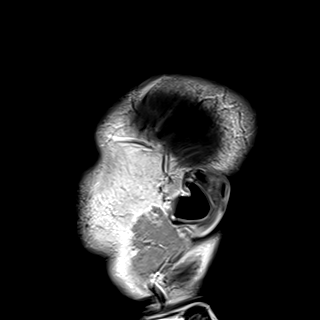

[30 of 48 positions shown; findings below may reference images not displayed]

FINDINGS: MRI HEAD FINDINGS

Brain: Cerebral volume within normal limits for age. Few scattered
subcentimeter foci of T2/FLAIR hyperintensity noted involving the
periventricular and deep white matter both cerebral hemispheres,
nonspecific, but most like related to chronic small vessel ischemic
disease. Overall, changes are minimal for age. Small remote cortical
infarct noted at the high anterior right frontal lobe, corresponding
with abnormality from prior CT (series 11, image 21). Few scattered
small remote bilateral cerebellar infarcts noted as well.

5 mm focus of restricted diffusion seen involving the right dorsal
pons, consistent with a small acute ischemic infarct (series 5,
image 66). No associated hemorrhage or mass effect. No other
abnormal foci of diffusion abnormality to suggest acute or subacute
ischemia. Gray-white matter differentiation otherwise maintained. No
foci of susceptibility artifact to suggest acute or chronic
intracranial hemorrhage.

No mass lesion, midline shift or mass effect. No hydrocephalus or
extra-axial fluid collection. Pituitary gland suprasellar region
within normal limits. Midline structures intact.

No abnormal enhancement following contrast administration.
Incidental note made of a left cerebellar DVA.

Vascular: Major intracranial vascular flow voids are maintained.

Skull and upper cervical spine: Craniocervical junction within
normal limits. Bone marrow signal intensity normal. No focal marrow
replacing lesion. No scalp soft tissue abnormality.

Sinuses/Orbits: Globes and orbital soft tissues within normal
limits. Paranasal sinuses are largely clear. Small left mastoid
effusion noted, of doubtful significance.

Other: None.

MRA HEAD FINDINGS

ANTERIOR CIRCULATION:

Examination mildly degraded by motion artifact.

Visualized distal cervical segments of the internal carotid arteries
are widely patent with antegrade flow. Petrous segments widely
patent. Mild atheromatous irregularity throughout the carotid
siphons without flow-limiting stenosis. Right A1 segment widely
patent. Left A1 hypoplastic and/or absent, accounting for the
slightly diminutive left ICA is compared to the right. Normal
anterior communicating artery complex. Anterior cerebral arteries
widely patent to their distal aspects without stenosis. No M1
stenosis or occlusion. Normal MCA bifurcations. Distal MCA branches
well perfused and symmetric.

POSTERIOR CIRCULATION:

Vertebral arteries patent to the vertebrobasilar junction without
stenosis. Right PICA patent. Left PICA not seen. Basilar somewhat
diminutive and mildly tortuous but is widely patent to its distal
aspect without stenosis. Superior cerebral arteries patent
bilaterally. PCAs supplied via the basilar or as well as prominent
and robust bilateral posterior communicating arteries. Both PCAs
well perfused to their distal aspects without stenosis.

No intracranial aneurysm.
IMPRESSION: MRI HEAD IMPRESSION:

1. 5 mm acute ischemic nonhemorrhagic right dorsal pontine infarct.
2. Small remote cortical infarct involving the high anterior right
frontal lobe, corresponding with abnormality seen on prior CT.
3. Few additional scattered small remote bilateral cerebellar
infarcts.

MRA HEAD IMPRESSION:

Negative intracranial MRA. No large vessel occlusion,
hemodynamically significant stenosis, or other acute vascular
abnormality.

## 2019-08-01 MED ORDER — ACETAMINOPHEN 325 MG PO TABS: 650.0000 mg | ORAL_TABLET | Freq: Four times a day (QID) | ORAL | Status: DC | PRN

## 2019-08-01 MED ORDER — SENNOSIDES-DOCUSATE SODIUM 8.6-50 MG PO TABS: 1.0000 | ORAL_TABLET | Freq: Every evening | ORAL | Status: DC | PRN

## 2019-08-01 MED ORDER — LORAZEPAM 2 MG/ML IJ SOLN
1.0000 mg | Freq: Once | INTRAMUSCULAR | Status: AC | PRN
  Administered 2019-08-01: 1 mg via INTRAVENOUS
  Filled 2019-08-01: qty 1

## 2019-08-01 MED ORDER — ONDANSETRON HCL 4 MG PO TABS: 4.0000 mg | ORAL_TABLET | Freq: Four times a day (QID) | ORAL | Status: DC | PRN

## 2019-08-01 MED ORDER — ACETAMINOPHEN 650 MG RE SUPP: 650.0000 mg | Freq: Four times a day (QID) | RECTAL | Status: DC | PRN

## 2019-08-01 MED ORDER — STROKE: EARLY STAGES OF RECOVERY BOOK
Freq: Once | Status: DC
  Filled 2019-08-01 (×3): qty 1

## 2019-08-01 MED ORDER — ONDANSETRON HCL 4 MG/2ML IJ SOLN: 4.0000 mg | Freq: Four times a day (QID) | INTRAMUSCULAR | Status: DC | PRN

## 2019-08-01 MED ORDER — ASPIRIN EC 81 MG PO TBEC
81.0000 mg | DELAYED_RELEASE_TABLET | Freq: Every day | ORAL | Status: DC
  Administered 2019-08-01 – 2019-08-03 (×3): 81 mg via ORAL
  Filled 2019-08-01 (×3): qty 1

## 2019-08-01 MED ORDER — PRAVASTATIN SODIUM 40 MG PO TABS
80.0000 mg | ORAL_TABLET | Freq: Every day | ORAL | Status: DC
  Administered 2019-08-01: 80 mg via ORAL
  Filled 2019-08-01: qty 2

## 2019-08-01 NOTE — ED Notes (Signed)
[facility] called. They will call back.

## 2019-08-01 NOTE — ED Notes (Signed)
Attempted to call report. The receiving RN is in report. RN will call back when available.

## 2019-08-01 NOTE — ED Provider Notes (Signed)
[facility] DEPT Provider Note   CSN: [id number] Arrival date & time: 08/01/19  1128     History Chief Complaint  Patient presents with  . cancer patient  . Numbness    [name] is a 69 y.o. male.  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  He said he did his normal exercise weightlifting routine this morning and then was watering the grass when he began to notice that he was unsteady on his feet and noticed some tingling in his left face left arm and a little bit of his left chest.  Does not appreciate any weakness.  No blurry vision double vision.  No headache.  Symptoms started at around 830 this morning.  He thinks it may be related to his exercise because he was doing butterflies.  The history is provided by the patient.  Cerebrovascular Accident This is a new problem. The current episode started 3 to 5 hours ago. The problem occurs constantly. The problem has not changed since onset.Pertinent negatives include no chest pain, no abdominal pain, no headaches and no shortness of breath. Nothing aggravates the symptoms. Nothing relieves the symptoms. He has tried nothing for the symptoms. The treatment provided no relief.       Past Medical History:  Diagnosis Date  . Anemia   . Cancer ([place])    Lymphoma  . Essential hypertension   . GERD (gastroesophageal reflux disease)   . GI bleed   . HLD (hyperlipidemia)   . Weakness     Patient Active Problem List   Diagnosis Date Noted  . Cognitive and behavioral changes 04/25/2019  . Pre-syncope 04/25/2019  . Bleeding ulcer with perforation ([place]) 02/09/2015  . 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 GI bleed 02/07/2015  . Symptomatic anemia 02/07/2015  . 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 [facility]) 01/25/2015  . Acute GI bleeding   . Intraabdominal mass 01/21/2015  . Mediastinal mass   . Essential hypertension   . HLD (hyperlipidemia)   .  Acute blood loss anemia     Past Surgical History:  Procedure Laterality Date  . ESOPHAGOGASTRODUODENOSCOPY Left 01/18/2015   Procedure: ESOPHAGOGASTRODUODENOSCOPY (EGD);  Surgeon: Wilford Corner, MD;  Location: [facility] ENDOSCOPY;  Service: Endoscopy;  Laterality: Left;  . INGUINAL HERNIA REPAIR Right 01/22/2015   Procedure: RIGHT INGUINAL LYMPH NODE BX;  Surgeon: Donnie Mesa, MD;  Location: [facility];  Service: General;  Laterality: Right;  . MEDIASTINOSCOPY N/A 05/05/2017   Procedure: MEDIASTINOSCOPY;  Surgeon: Grace Isaac, MD;  Location: [facility];  Service: Thoracic;  Laterality: N/A;  . PORTACATH PLACEMENT  01/26/2015    power port with tip SVC/RA [place]  . SKIN SURGERY     Small benign cysts over left scalp removed  . VIDEO BRONCHOSCOPY WITH ENDOBRONCHIAL ULTRASOUND N/A 05/05/2017   Procedure: VIDEO BRONCHOSCOPY WITH ENDOBRONCHIAL ULTRASOUND;  Surgeon: Grace Isaac, MD;  Location: [facility] OR;  Service: Thoracic;  Laterality: N/A;       Family History  Problem Relation Age of Onset  . Hypertension Mother   . Hypertension Father   . Kidney failure Father   . Hypertension Sister   . Diabetes Sister   . Prostate cancer Brother   . Lupus Sister     Social History   Tobacco Use  . Smoking status: Never Smoker  . Smokeless tobacco: Never Used  Vaping Use  . Vaping Use: Never used  Substance Use  Topics  . Alcohol use: Yes    Comment: occasional beer  . Drug use: No    Home Medications Prior to Admission medications   Medication Sig Start Date End Date Taking? Authorizing Provider  diazepam (VALIUM) 5 MG tablet Take 1 tablet 1 hour prior to scan and repeat if needed 05/03/19   Magrinat, Virgie Dad, MD  IMBRUVICA 420 MG TABS TAKE 1 TABLET BY MOUTH DAILY. 07/11/19   Magrinat, Virgie Dad, MD  lisinopril-hydrochlorothiazide (ZESTORETIC) 20-25 MG tablet TAKE 1 TABLET BY MOUTH EVERY DAY 05/24/19   Magrinat, Virgie Dad, MD  lovastatin (MEVACOR) 20 MG tablet TAKE 1 TABLET BY MOUTH EVERYDAY  AT BEDTIME 01/18/19   Magrinat, Virgie Dad, MD    Allergies    Patient has no known allergies.  Review of Systems   Review of Systems  Constitutional: Negative for fever.  HENT: Negative for sore throat.   Eyes: Negative for visual disturbance.  Respiratory: Negative for shortness of breath.   Cardiovascular: Negative for chest pain.  Gastrointestinal: Negative for abdominal pain.  Genitourinary: Negative for dysuria.  Musculoskeletal: Positive for gait problem.  Skin: Negative for rash.  Neurological: Positive for numbness. Negative for headaches.    Physical Exam Updated Vital Signs BP (!) 145/75 (BP Location: Left Arm)   Pulse 75   Temp 98.3 F (36.8 C) (Oral)   Resp 18   Ht 5' 9.5" (1.765 m)   Wt 91.6 kg   SpO2 99%   BMI 29.40 kg/m   Physical Exam Vitals and nursing note reviewed.  Constitutional:      Appearance: He is well-developed.  HENT:     Head: Normocephalic and atraumatic.  Eyes:     Conjunctiva/sclera: Conjunctivae normal.  Cardiovascular:     Rate and Rhythm: Normal rate and regular rhythm.     Heart sounds: No murmur heard.   Pulmonary:     Effort: Pulmonary effort is normal. No respiratory distress.     Breath sounds: Normal breath sounds.  Abdominal:     Palpations: Abdomen is soft.     Tenderness: There is no abdominal tenderness.  Musculoskeletal:     Cervical back: Neck supple.  Skin:    General: Skin is warm and dry.     Capillary Refill: Capillary refill takes less than 2 seconds.  Neurological:     Mental Status: He is alert and oriented to person, place, and time.     Sensory: Sensory deficit present.     Motor: No weakness.     Gait: Gait abnormal.     Comments: Patient has no obvious facial droop.  Decreased sensation left forehead left cheek left-sided neck left arm.  Normal strength 5 x 5 upper and lower extremities.  Unable to tandem walk, causes him to be unsteady.  Was able to ambulate to the bathroom.     ED Results /  Procedures / Treatments   Labs (all labs ordered are listed, but only abnormal results are displayed) Labs Reviewed  COMPREHENSIVE METABOLIC PANEL - Abnormal; Notable for the following components:      Result Value   Creatinine, Ser 1.33 (*)    Alkaline Phosphatase 35 (*)    GFR calc non Af Amer 55 (*)    All other components within normal limits  URINALYSIS, ROUTINE W REFLEX MICROSCOPIC - Abnormal; Notable for the following components:   Color, Urine STRAW (*)    All other components within normal limits  LIPID PANEL - Abnormal; Notable for the  following components:   Cholesterol 227 (*)    LDL Cholesterol 155 (*)    All other components within normal limits  I-STAT CHEM 8, ED - Abnormal; Notable for the following components:   Creatinine, Ser 1.30 (*)    All other components within normal limits  SARS CORONAVIRUS 2 BY RT PCR (HOSPITAL ORDER, [place] [facility])  ETHANOL  PROTIME-INR  APTT  CBC  DIFFERENTIAL  RAPID URINE DRUG SCREEN, HOSP PERFORMED  HEMOGLOBIN A1C  CBG MONITORING, ED    EKG EKG Interpretation  Date/Time:  Monday August 01 2019 12:36:24 EDT Ventricular Rate:  75 PR Interval:    QRS Duration: 97 QT Interval:  366 QTC Calculation: 409 R Axis:   -8 Text Interpretation: Sinus rhythm Ventricular bigeminy Anteroseptal infarct, old Nonspecific repol abnormality, lateral leads increased ectopy compared with prior 4/19 Confirmed by Aletta Edouard [phone]) on 08/01/2019 12:42:42 PM   Radiology CT HEAD CODE STROKE WO CONTRAST  Result Date: 08/01/2019 CLINICAL DATA:  Code stroke. Neuro deficit, acute, stroke suspected. Additional history provided: Weight lifter, began to experience tingling of the left side of head and left arm around 8 a.m. today, patient reports feeling off balance, patient currently taking oral chemotherapy, history of mantle cell lymphoma. EXAM: CT HEAD WITHOUT CONTRAST TECHNIQUE: Contiguous axial images were obtained from the  base of the skull through the vertex without intravenous contrast. COMPARISON:  PET-CT 05/09/2019, CT head 01/23/2015. FINDINGS: Brain: There is limited assessment for intracranial manifestations of lymphoma on this noncontrast head CT. Stable, mild generalized parenchymal atrophy. There is a small cortically based ischemic infarct within the mid right frontal lobe (series 12, image 21) (series 4, image 30). This is new from prior head CT 01/23/2015 and age-indeterminate, although favored chronic given appearance. There is a small lacunar infarct within the superior left cerebellum which appears chronic and in retrospect was likely present on prior head CT 01/23/2015. Minimal ill-defined hypoattenuation within the cerebral white matter is nonspecific, but consistent with chronic small vessel ischemic disease. There is no acute intracranial hemorrhage. No extra-axial fluid collection. No evidence of intracranial mass. No midline shift. Vascular: No hyperdense vessel.  Atherosclerotic calcifications. Skull: Normal. Negative for fracture or focal lesion. Sinuses/Orbits: Visualized orbits show no acute finding. No significant paranasal sinus disease or mastoid effusion at the imaged levels. ASPECTS [facility] Stroke Program Early CT Score) - Ganglionic level infarction (caudate, lentiform nuclei, internal capsule, insula, M1-M3 cortex): 7 - Supraganglionic infarction (M4-M6 cortex): 2 Total score (0-10 with 10 being normal): 10 These results were called by telephone at the time of interpretation on 08/01/2019 at 12:24 pm to provider [facility] , who verbally acknowledged these results. IMPRESSION: 1. A small cortical infarct within the mid right frontal lobe is age-indeterminate, but favored chronic given appearance. ASPECTS is 9. Consider brain MRI for further evaluation. 2. Small infarct within the superior left cerebellar hemisphere, which appears chronic and in retrospect, was likely present on prior head CT  01/23/2015. 3. Stable mild generalized parenchymal atrophy and chronic small vessel ischemic disease. 4. Please note there is limited assessment for intracranial manifestations of lymphoma on this non-contrast head CT. Electronically Signed   By: Kellie Simmering DO   On: 08/01/2019 12:25   VAS US CAROTID (at [facility] and [facility] only)  Result Date: 08/01/2019 Carotid Arterial Duplex Study Indications:       TIA and Numbness. Risk Factors:      Hypertension, hyperlipidemia. Other Factors:     Non-Hodgkin  Lymphoma. Limitations        Today's exam was limited due to patient anatomy. Comparison Study:  No prior study Performing Technologist: Maudry Mayhew MHA, RDMS, RVT, RDCS  Examination Guidelines: A complete evaluation includes B-mode imaging, spectral Doppler, color Doppler, and power Doppler as needed of all accessible portions of each vessel. Bilateral testing is considered an integral part of a complete examination. Limited examinations for reoccurring indications may be performed as noted.  Right Carotid Findings: +----------+--------+--------+--------+-----------------------+--------+           PSV cm/sEDV cm/sStenosisPlaque Description     Comments +----------+--------+--------+--------+-----------------------+--------+ CCA Prox  88      26                                              +----------+--------+--------+--------+-----------------------+--------+ CCA Distal67      22              smooth and heterogenous         +----------+--------+--------+--------+-----------------------+--------+ ICA Prox  81      21              smooth and homogeneous          +----------+--------+--------+--------+-----------------------+--------+ ICA Distal85      24                                              +----------+--------+--------+--------+-----------------------+--------+ ECA       73      11              smooth and heterogenous          +----------+--------+--------+--------+-----------------------+--------+ +----------+--------+-------+----------------+-------------------+           PSV cm/sEDV cmsDescribe        Arm Pressure (mmHG) +----------+--------+-------+----------------+-------------------+ [id number]            Multiphasic, [id number]                 +----------+--------+-------+----------------+-------------------+ +---------+--------+--+--------+--+---------+ VertebralPSV cm/s32EDV cm/s13Antegrade +---------+--------+--+--------+--+---------+  Left Carotid Findings: +----------+--------+-------+--------+--------------------------------+--------+           PSV cm/sEDV    StenosisPlaque Description              Comments                   cm/s                                                    +----------+--------+-------+--------+--------------------------------+--------+ CCA Prox  77      15                                                      +----------+--------+-------+--------+--------------------------------+--------+ CCA Distal82      19             smooth, heterogenous and  calcific                                 +----------+--------+-------+--------+--------------------------------+--------+ ICA Prox  251     73     60-79%  smooth, heterogenous and                                                  calcific                                 +----------+--------+-------+--------+--------------------------------+--------+ ICA Distal75      35                                                      +----------+--------+-------+--------+--------------------------------+--------+ ECA       93      13                                                      +----------+--------+-------+--------+--------------------------------+--------+ +----------+--------+--------+------------+-------------------+           PSV  cm/sEDV cm/sDescribe    Arm Pressure (mmHG) +----------+--------+--------+------------+-------------------+ Subclavian                Not assessed134                 +----------+--------+--------+------------+-------------------+ +---------+--------+---+--------+---+----------+ VertebralPSV cm/s-69EDV cm/s-25Retrograde +---------+--------+---+--------+---+----------+   Summary: Right Carotid: Velocities in the right ICA are consistent with a 1-39% stenosis. Left Carotid: Velocities in the left ICA are consistent with a 60-79% stenosis. Vertebrals:  Right vertebral artery demonstrates antegrade flow. Left vertebral              artery demonstrates retrograde flow. Subclavians: Left subclavian artery was not visualized. Normal flow hemodynamics              were seen in the right subclavian artery. *See table(s) above for measurements and observations.     Preliminary     Procedures .Critical Care Performed by: Hayden Rasmussen, MD Authorized by: Hayden Rasmussen, MD   Critical care provider statement:    Critical care time (minutes):  45   Critical care time was exclusive of:  Separately billable procedures and treating other patients   Critical care was necessary to treat or prevent imminent or life-threatening deterioration of the following conditions:  CNS failure or compromise   Critical care was time spent personally by me on the following activities:  Discussions with consultants, evaluation of patient's response to treatment, examination of patient, ordering and performing treatments and interventions, ordering and review of laboratory studies, ordering and review of radiographic studies, pulse oximetry, re-evaluation of patient's condition, obtaining history from patient or surrogate, review of old charts and development of treatment plan with patient or surrogate   I assumed direction of critical care for this patient from another provider in my specialty: no     (including  critical care time)  Medications Ordered in ED  Medications   stroke: mapping our early stages of recovery book (has no administration in time range)  aspirin EC tablet 81 mg (81 mg Oral Given 08/01/19 1651)  LORazepam (ATIVAN) injection 1 mg (has no administration in time range)  pravastatin (PRAVACHOL) tablet 80 mg (has no administration in time range)    ED Course  I have reviewed the triage vital signs and the nursing notes.  Pertinent labs & imaging results that were available during my care of the patient were reviewed by me and considered in my medical decision making (see chart for details).  Clinical Course as of Jul 31 1717  Mon Aug 01, 2019  1231 EKG showing sinus rhythm with frequent PVCs nonspecific T wave flattening.   [MB]  1240 Patient was a stroke activation and was interviewed and examined by Dr. Lucia Gaskins.  He is given the patient an NIH of 0 and is not recommending TPA.  He does recommend that the patient be admitted to the hospital for further stroke work-up.  No LVO. Patient agreeable to admission.   [MB]  1157 Patient's wife states he has had dizziness in the past and also is becoming increasingly forgetful.  Patient denies this.  He was supposed to get an MRI in the past and canceled his appointment.  States he is very claustrophobic.   [MB]    Clinical Course User Index [MB] Hayden Rasmussen, MD   MDM Rules/Calculators/A&P                         This patient complains of unsteadiness, left face and arm tingling; this involves an extensive number of treatment Options and is a complaint that carries with it a high risk of complications and Morbidity. The differential includes stroke, TIA, hypertensive urgency, metabolic derangement, musculoskeletal, vascular  I ordered, reviewed and interpreted labs, which included CBC with normal white count normal hemoglobin, chemistries essentially normal other than a slightly elevated creatinine, normal glucose, negative tox,  negative Covid, normal INR  I ordered imaging studies which included CT head and I independently    visualized and interpreted imaging which showed 2 areas of stroke Additional history obtained from patient's wife Previous records obtained and reviewed in epic including prior oncology notes I consulted teleneurology and [facility] hospitalist Dr. Lonny Prude and discussed lab and imaging findings  Critical Interventions: Rapid work-up of patient's acute strokelike symptoms.  Consideration for TPA.  After the interventions stated above, I reevaluated the patient and found patient's neuro symptoms to seem to be improving.  He will need to be admitted to the hospital for further work-up including MRI.  Patient in agreement with plan.   Final Clinical Impression(s) / ED Diagnoses Final diagnoses:  Left sided numbness  Lymphoma, unspecified body region, unspecified lymphoma type [facility])    Rx / DC Orders ED Discharge Orders    None       Hayden Rasmussen, MD 08/01/19 1723

## 2019-08-01 NOTE — ED Triage Notes (Addendum)
Patient states he lifts weights daily and began having tingling of the left side of his head and left arm around 0800 today.  Patient also reports that he feels off balance. Patient is taking oral chemo.

## 2019-08-01 NOTE — ED Notes (Signed)
Pt has completed CT

## 2019-08-01 NOTE — Consult Note (Signed)
[facility] Tele[facility]urology Consult Services   Date of Service:   08/01/2019 12:14:35  Impression:     .  G45.9 - Transient cerebral ischemic attack, unspecified     .  I63.00 - Cerebrovascular accident (CVA) due to thrombosis of precerebral artery ([place])  Comments/Sign-Out: Most of her symptoms have disappeared. It is either a transient ischemic attack or aid small vessel stroke. He is not a candidate for thrombolytic therapy because he has no residual neurologic deficit or at most a very mild one that is not disabling likely to improve. He is not have symptoms of large vessel occlusion. He is not a candidate for thrombectomy.  Metrics: Last Known Well: 08/01/2019 08:30:00 [facility] Notification Time: 08/01/2019 12:14:35 Arrival Time: 08/01/2019 11:28:00 Stamp Time: 08/01/2019 12:14:35 Time First Login Attempt: 08/01/2019 12:22:44 Symptoms: Left-sided numbness NIHSS Start Assessment Time: 08/01/2019 12:27:00 Patient is not a candidate for Alteplase/Activase. Alteplase Medical Decision: 08/01/2019 12:29:34 Patient was not deemed candidate for Alteplase/Activase thrombolytics because of following reasons: Resolved symptoms (no residual disabling symptoms). No disabling symptoms.  CT head showed no acute hemorrhage or acute core infarct. CT head was reviewed and results were: There is a remote lacunar infarct in the left cerebellum and a subacute to chronic infarct in the right frontal cortex. She stood wife a Chief Executive Officer  ED Physician notified of diagnostic impression and management plan on 08/01/2019 12:36:00  Advanced Imaging: Advanced Imaging Not Recommended because:  Clinical Presentation is not Suggestive of LVO and NIHSS is <6   Our recommendations are outlined below.  Recommendations:     .  Activate Stroke Protocol Admission/Order Set     .  Stroke/Telemetry Floor     .  Neuro Checks     .  Bedside Swallow Eval     .  DVT Prophylaxis     .  IV  Fluids, Normal Saline     .  Head of Bed 30 Degrees     .  Euglycemia and Avoid Hyperthermia (PRN Acetaminophen)     .  Initiate Aspirin 81 MG Daily  Routine Consultation with [facility] for Follow up Care  Sign Out:     .  Discussed with Emergency Department Provider    ------------------------------------------------------------------------------  History of Present Illness: Patient is a 69 year old Male.  Patient was brought by private transportation with symptoms of Left-sided numbness  This 69 year old man was exercising this morning at approximately 0830 when he suddenly developed unsteadiness and tingling of the left face and arm. When he arrived at the emergency department the emergency physician noted that he was walking normally but when he had him do tandem walking he was ataxic. He has no headache. He has no loss of vision her speech. He has no weakness of the extremities. 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 He is taken the same medication for two years.  Last seen normal was within 4.5 hours. There is no history of hemorrhagic complications or intracranial hemorrhage. There is no history of Recent Anticoagulants. There is no history of recent major surgery. There is no history of recent stroke.  Past Medical History:     . Hypertension     . Hyperlipidemia     . There is NO history of Diabetes Mellitus     . There is NO history of Atrial Fibrillation     . There is NO history of Coronary Artery Disease     . There is NO history of Stroke  Anticoagulant use:  No  Antiplatelet use: No     Examination: BP(134/78), Pulse(75), Blood Glucose(94) 1A: Level of Consciousness - Alert; keenly responsive + 0 1B: Ask Month and Age - Both Questions Right + 0 1C: Blink Eyes & Squeeze Hands - Performs Both Tasks + 0 2: Test Horizontal Extraocular Movements - Normal + 0 3: Test Visual Fields - No Visual Loss + 0 4: Test Facial Palsy (Use Grimace  if Obtunded) - Normal symmetry + 0 5A: Test Left Arm Motor Drift - No Drift for 10 Seconds + 0 5B: Test Right Arm Motor Drift - No Drift for 10 Seconds + 0 6A: Test Left Leg Motor Drift - No Drift for 5 Seconds + 0 6B: Test Right Leg Motor Drift - No Drift for 5 Seconds + 0 7: Test Limb Ataxia (FNF/Heel-Shin) - No Ataxia + 0 8: Test Sensation - Normal; No sensory loss + 0 9: Test Language/Aphasia - Normal; No aphasia + 0 10: Test Dysarthria - Normal + 0 11: Test Extinction/Inattention - No abnormality + 0  NIHSS Score: 0  Pre-Morbid Modified Ranking Scale: 0 Points = No symptoms at all   Patient/Family was informed the Neurology Consult would occur via TeleHealth consult by way of interactive audio and video telecommunications and consented to receiving care in this manner.   Patient is being evaluated for possible acute neurologic impairment and high probability of imminent or life-threatening deterioration. I spent total of 24 minutes providing care to this patient, including time for face to face visit via telemedicine, review of medical records, imaging studies and discussion of findings with providers, the patient and/or family.   Dr Cindie Laroche   [facility] [phone]  Case [id number]

## 2019-08-01 NOTE — Consult Note (Addendum)
NEURO HOSPITALIST CONSULT NOTE   Requesting physician: Dr. Lonny Prude  Reason for Consult: Acute onset of left hemiataxia and patchy LUE sensory numbness  History obtained from:   Patient and Chart     HPI:                                                                                                                                          [name] is an 69 y.o. male who presented to the [facility] ED after experiencing new-onset of tingling of the left side of his head and left arm around 0800 today, while lifting weights. He also noted that he felt off balance when ambulating. He has a history of NHL and is on oral chemotherapy. He has no prior history of stroke or MI.   He was seen by Teleneurology. He was deemed not to be a tPA or endovascular candidate due to minimal neurological deficits.   Past Medical History:  Diagnosis Date   Anemia    Cancer ([place])    Lymphoma   Essential hypertension    GERD (gastroesophageal reflux disease)    GI bleed    HLD (hyperlipidemia)    Weakness     Past Surgical History:  Procedure Laterality Date   ESOPHAGOGASTRODUODENOSCOPY Left 01/18/2015   Procedure: ESOPHAGOGASTRODUODENOSCOPY (EGD);  Surgeon: Wilford Corner, MD;  Location: [facility] ENDOSCOPY;  Service: Endoscopy;  Laterality: Left;   INGUINAL HERNIA REPAIR Right 01/22/2015   Procedure: RIGHT INGUINAL LYMPH NODE BX;  Surgeon: Donnie Mesa, MD;  Location: [place];  Service: General;  Laterality: Right;   MEDIASTINOSCOPY N/A 05/05/2017   Procedure: MEDIASTINOSCOPY;  Surgeon: Grace Isaac, MD;  Location: [facility] OR;  Service: Thoracic;  Laterality: N/A;   PORTACATH PLACEMENT  01/26/2015    power port with tip SVC/RA [place]   SKIN SURGERY     Small benign cysts over left scalp removed   VIDEO BRONCHOSCOPY WITH ENDOBRONCHIAL ULTRASOUND N/A 05/05/2017   Procedure: VIDEO BRONCHOSCOPY WITH ENDOBRONCHIAL ULTRASOUND;  Surgeon: Grace Isaac, MD;  Location: [facility] OR;   Service: Thoracic;  Laterality: N/A;    Family History  Problem Relation Age of Onset   Hypertension Mother    Hypertension Father    Kidney failure Father    Hypertension Sister    Diabetes Sister    Prostate cancer Brother    Lupus Sister               Social History:  reports that he has never smoked. He has never used smokeless tobacco. He reports current alcohol use. He reports that he does not use drugs.  No Known Allergies  MEDICATIONS:  Prior to Admission: (Not in a hospital admission)  Scheduled:   stroke: mapping our early stages of recovery book   Does not apply Once   aspirin EC  81 mg Oral Daily   pravastatin  80 mg Oral q1800     ROS:                                                                                                                                       As per HPI. Comprehensive ROS otherwise negative.    Blood pressure 125/78, pulse (!) 54, temperature 98.3 F (36.8 C), temperature source Oral, resp. rate 14, height 5' 9.5" (1.765 m), weight 91.6 kg, SpO2 100 %.   General Examination:                                                                                                       Physical Exam  HEENT-  [place]/AT  Lungs: Respirations unlabored  Extremities- No edema  Neurological Examination Mental Status: Alert, oriented, thought content appropriate.  Speech fluent without evidence of aphasia.  Able to follow all commands without difficulty. Cranial Nerves: II: Visual fields intact with no extinction to DSS. PERRL  III,IV, VI: No ptosis. EOMI.  V,VII: Smile symmetric, facial temp sensation equal bilaterally VIII: hearing intact to voice IX,X: No hypophonia XI: Symmetric shoulder shrug XII: Midline tongue extension Motor: Right : Upper extremity   5/5    Left:     Upper extremity   5/5  Lower extremity    5/5     Lower extremity   5/5 No pronator drift Sensory: Temp and light touch intact throughout, bilaterally. No extinction to DSS Deep Tendon Reflexes: 1+ and symmetric throughout Plantars: Right: downgoing   Left: downgoing Cerebellar: Positive for ataxia with FNF and H-S on the left. No ataxia on the right.  Gait: Deferred   Lab Results: Basic Metabolic Panel: Recent Labs  Lab 07/28/19 1000 08/01/19 1218 08/01/19 1227  NA 137 139 139  K 3.9 3.6 3.5  CL 102 101 100  CO2 26 25  --   GLUCOSE 94 98 95  BUN 24* 23 22  CREATININE 1.30* 1.33* 1.30*  CALCIUM 8.9 9.3  --     CBC: Recent Labs  Lab 07/28/19 1000 08/01/19 1218 08/01/19 1227  WBC 4.4 4.6  --   NEUTROABS 2.3 2.9  --   HGB 12.6* 13.2 13.9  HCT 39.0 40.5 41.0  MCV 87.1 86.9  --  PLT 299 301  --     Cardiac Enzymes: No results for input(s): CKTOTAL, CKMB, CKMBINDEX, TROPONINI in the last 168 hours.  Lipid Panel: Recent Labs  Lab 08/01/19 1218  CHOL 227*  TRIG 91  HDL 54  CHOLHDL 4.2  VLDL 18  LDLCALC 155*    Imaging: CT HEAD CODE STROKE WO CONTRAST  Result Date: 08/01/2019 CLINICAL DATA:  Code stroke. Neuro deficit, acute, stroke suspected. Additional history provided: Weight lifter, began to experience tingling of the left side of head and left arm around 8 a.m. today, patient reports feeling off balance, patient currently taking oral chemotherapy, history of mantle cell lymphoma. EXAM: CT HEAD WITHOUT CONTRAST TECHNIQUE: Contiguous axial images were obtained from the base of the skull through the vertex without intravenous contrast. COMPARISON:  PET-CT 05/09/2019, CT head 01/23/2015. FINDINGS: Brain: There is limited assessment for intracranial manifestations of lymphoma on this noncontrast head CT. Stable, mild generalized parenchymal atrophy. There is a small cortically based ischemic infarct within the mid right frontal lobe (series 12, image 21) (series 4, image 30). This is new from prior head CT  01/23/2015 and age-indeterminate, although favored chronic given appearance. There is a small lacunar infarct within the superior left cerebellum which appears chronic and in retrospect was likely present on prior head CT 01/23/2015. Minimal ill-defined hypoattenuation within the cerebral white matter is nonspecific, but consistent with chronic small vessel ischemic disease. There is no acute intracranial hemorrhage. No extra-axial fluid collection. No evidence of intracranial mass. No midline shift. Vascular: No hyperdense vessel.  Atherosclerotic calcifications. Skull: Normal. Negative for fracture or focal lesion. Sinuses/Orbits: Visualized orbits show no acute finding. No significant paranasal sinus disease or mastoid effusion at the imaged levels. ASPECTS [facility] Stroke Program Early CT Score) - Ganglionic level infarction (caudate, lentiform nuclei, internal capsule, insula, M1-M3 cortex): 7 - Supraganglionic infarction (M4-M6 cortex): 2 Total score (0-10 with 10 being normal): 10 These results were called by telephone at the time of interpretation on 08/01/2019 at 12:24 pm to provider [facility] , who verbally acknowledged these results. IMPRESSION: 1. A small cortical infarct within the mid right frontal lobe is age-indeterminate, but favored chronic given appearance. ASPECTS is 9. Consider brain MRI for further evaluation. 2. Small infarct within the superior left cerebellar hemisphere, which appears chronic and in retrospect, was likely present on prior head CT 01/23/2015. 3. Stable mild generalized parenchymal atrophy and chronic small vessel ischemic disease. 4. Please note there is limited assessment for intracranial manifestations of lymphoma on this non-contrast head CT. Electronically Signed   By: Kellie Simmering DO   On: 08/01/2019 12:25   VAS US CAROTID (at [facility] and [facility] only)  Result Date: 08/01/2019 Carotid Arterial Duplex Study Indications:       TIA and Numbness. Risk Factors:       Hypertension, hyperlipidemia. Other Factors:     Non-Hodgkin Lymphoma. Limitations        Today's exam was limited due to patient anatomy. Comparison Study:  No prior study Performing Technologist: Maudry Mayhew MHA, RDMS, RVT, RDCS  Examination Guidelines: A complete evaluation includes B-mode imaging, spectral Doppler, color Doppler, and power Doppler as needed of all accessible portions of each vessel. Bilateral testing is considered an integral part of a complete examination. Limited examinations for reoccurring indications may be performed as noted.  Right Carotid Findings: +----------+--------+--------+--------+-----------------------+--------+             PSV cm/s EDV cm/s Stenosis Plaque Description  Comments  +----------+--------+--------+--------+-----------------------+--------+  CCA Prox   88       26                                                  +----------+--------+--------+--------+-----------------------+--------+  CCA Distal 67       22                smooth and heterogenous           +----------+--------+--------+--------+-----------------------+--------+  ICA Prox   81       21                smooth and homogeneous            +----------+--------+--------+--------+-----------------------+--------+  ICA Distal 85       24                                                  +----------+--------+--------+--------+-----------------------+--------+  ECA        73       11                smooth and heterogenous           +----------+--------+--------+--------+-----------------------+--------+ +----------+--------+-------+----------------+-------------------+             PSV cm/s EDV cms Describe         Arm Pressure (mmHG)  +----------+--------+-------+----------------+-------------------+  Subclavian 158              Multiphasic, WNL 148                  +----------+--------+-------+----------------+-------------------+ +---------+--------+--+--------+--+---------+  Vertebral PSV cm/s 32 EDV  cm/s 13 Antegrade  +---------+--------+--+--------+--+---------+  Left Carotid Findings: +----------+--------+-------+--------+--------------------------------+--------+             PSV cm/s EDV     Stenosis Plaque Description               Comments                       cm/s                                                        +----------+--------+-------+--------+--------------------------------+--------+  CCA Prox   77       15                                                          +----------+--------+-------+--------+--------------------------------+--------+  CCA Distal 82       19               smooth, heterogenous and  calcific                                   +----------+--------+-------+--------+--------------------------------+--------+  ICA Prox   251      73      60-79%   smooth, heterogenous and                                                         calcific                                   +----------+--------+-------+--------+--------------------------------+--------+  ICA Distal 75       35                                                          +----------+--------+-------+--------+--------------------------------+--------+  ECA        93       13                                                          +----------+--------+-------+--------+--------------------------------+--------+ +----------+--------+--------+------------+-------------------+             PSV cm/s EDV cm/s Describe     Arm Pressure (mmHG)  +----------+--------+--------+------------+-------------------+  Subclavian                   Not assessed 134                  +----------+--------+--------+------------+-------------------+ +---------+--------+---+--------+---+----------+  Vertebral PSV cm/s -69 EDV cm/s -25 Retrograde  +---------+--------+---+--------+---+----------+   Summary: Right Carotid: Velocities in the right ICA are consistent with a 1-39% stenosis. Left  Carotid: Velocities in the left ICA are consistent with a 60-79% stenosis. Vertebrals:  Right vertebral artery demonstrates antegrade flow. Left vertebral              artery demonstrates retrograde flow. Subclavians: Left subclavian artery was not visualized. Normal flow hemodynamics              were seen in the right subclavian artery. *See table(s) above for measurements and observations.     Preliminary     Assessment: 69 year old male with acute onset of patchy sensory numbness of LUE and mild left hemiataxia.  1. Exam reveals LUE and LLE ataxia. No limb weakness or objective sensory numbness noted.  2. CT head reveals a small cortical infarct within the mid right frontal lobe which is age-indeterminate, but favored chronic given appearance. There is also a small infarct within the superior left cerebellar hemisphere, which appears chronic and in retrospect, was likely present on prior head CT 01/23/2015. Stable mild generalized parenchymal atrophy and chronic small vessel ischemic disease. 3. Non-Hodgkins lymphoma. Diagnosed in 2016. On chemotherapy and has a port in place.  4. Stroke risk factors: Cancer history, HLD and HTN.  5. Carotid ultrasound:  Right Carotid: Velocities in the right ICA are consistent with a 1-39% stenosis. Left Carotid: Velocities in the left ICA are consistent with a 60-79% stenosis. Vertebrals:  Right vertebral artery demonstrates antegrade flow. Left vertebral artery demonstrates retrograde flow. Subclavians: Left subclavian artery was not visualized. Normal flow hemodynamics   Recommendations: 1. MRI brain and MRA head without contrast. The patient is claustrophobic and will require sedation. Recommend 2 mg IV Ativan.  2. HgbA1c, fasting lipid panel 3. PT consult, OT consult, Speech consult 4. TTE 5. NPO until passes stroke swallow screen 6. Has been started on ASA 81 mg po qd 7. Continue pravastatin 8. Cardiac telemetry 9. Risk factor modification 10. Frequent  neuro checks 11. Permissive HTN x 24 hours 12. Please page stroke NP  Or  PA  Or MD from 8am -4 pm  as this patient from this time will be  followed by the stroke.   You can look them up on [email or web address]  Password [email or web address]    Electronically signed: Dr. Kerney Elbe 08/01/2019, 7:18 PM

## 2019-08-01 NOTE — Progress Notes (Signed)
Carotid artery duplex completed. Refer to "CV Proc" under chart review to view preliminary results.  08/01/2019 4:14 PM Kelby Aline., MHA, RVT, RDCS, RDMS

## 2019-08-01 NOTE — H&P (Signed)
History and Physical  ETHELBERT THAIN Taylor:[id number] DOB: 01-15-1951 DOA: 08/01/2019   PCP: Shelda Pal, DO  Outpatient Specialists: Dr. Jana Hakim (oncology) Patient coming from: Home At his baseline ambulates independently  Chief Complaint: Left arm/hand numbness and tingling  HPI: [name] is a 69 y.o. male with medical history significant for non-Hodgkin's lymphoma on oral chemotherapy, HTN, HLD who presents on 08/01/2019 with sudden episode of left-sided numbness and tingling.  Patient states he was in his usual state of health.  He often lifts weights and does 150 push-ups every day.  Shortly after finishing his exercise regimen while watering his lawn around 8 AM he noted feeling "off" with increased numbness on the left side of his face as well as a similar numbness and tingling in his left wrist and shoulder.  He stated he felt like he was off balance but that it would go away on its own.  He was able to drive his son to work when the feeling had not gone away after an hour and a half him and his wife drove to the emergency department.  He also reports a sensation of left ear fullness/increased pressure.  He is followed by Dr. Jana Hakim receiving oral chemotherapy for non-Hodgkin's lymphoma with no complications.  Concerning his hypertension he states he is adherent with his blood pressure regimen regimen  Concerning his hyperlipidemia he also reports adherence with his statin therapy.    ED course: Afebrile heart rate range 75-80, blood pressure 145/75.  SARS PCR negative. Creatinine 1.3 (consistent with baseline).  Otherwise labs unremarkable.  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  Given concern for TIA versus stroke teleneurology was consulted who recommended initiation of aspirin 81 mg and activate stroke protocol admission/order set. [facility] service was called for further management, Review of Systems:As mentioned in the history of present illness.Review of systems are otherwise negative Patient seen in the ED.   Past Medical History:  Diagnosis Date  . Anemia   . Cancer ([place])    Lymphoma  . Essential hypertension   . GERD (gastroesophageal reflux disease)   . GI bleed   . HLD (hyperlipidemia)   . Weakness    Past Surgical History:  Procedure Laterality Date  . ESOPHAGOGASTRODUODENOSCOPY Left 01/18/2015   Procedure: ESOPHAGOGASTRODUODENOSCOPY (EGD);  Surgeon: Wilford Corner, MD;  Location: [facility] ENDOSCOPY;  Service: Endoscopy;  Laterality: Left;  . INGUINAL HERNIA REPAIR Right 01/22/2015   Procedure: RIGHT INGUINAL LYMPH NODE BX;  Surgeon: Donnie Mesa, MD;  Location: [place];  Service: General;  Laterality: Right;  . MEDIASTINOSCOPY N/A 05/05/2017   Procedure: MEDIASTINOSCOPY;  Surgeon: Grace Isaac, MD;  Location: [place];  Service: Thoracic;  Laterality: N/A;  . PORTACATH PLACEMENT  01/26/2015    power port with tip SVC/[place]  . SKIN SURGERY     Small benign cysts over left scalp removed  . VIDEO BRONCHOSCOPY WITH ENDOBRONCHIAL ULTRASOUND N/A 05/05/2017   Procedure: VIDEO BRONCHOSCOPY WITH ENDOBRONCHIAL ULTRASOUND;  Surgeon: Grace Isaac, MD;  Location: [place];  Service: Thoracic;  Laterality: N/A;   No Known Allergies Social History:  reports that he has never smoked. He has never used smokeless tobacco. He reports current alcohol use. He reports that he does not use drugs. Family History  Problem Relation Age of Onset  . Hypertension Mother   . Hypertension Father   . Kidney  failure Father   . Hypertension Sister   . Diabetes Sister   . Prostate cancer Brother   . Lupus Sister       Prior to Admission medications   Medication Sig Start Date End Date Taking? Authorizing Provider  diazepam (VALIUM) 5 MG tablet Take 1 tablet 1 hour prior to scan and repeat if needed 05/03/19  Yes  Magrinat, Virgie Dad, MD  IMBRUVICA 420 MG TABS TAKE 1 TABLET BY MOUTH DAILY. 07/11/19  Yes Magrinat, Virgie Dad, MD  lisinopril-hydrochlorothiazide (ZESTORETIC) 20-25 MG tablet TAKE 1 TABLET BY MOUTH EVERY DAY Patient taking differently: Take 1 tablet by mouth daily.  05/24/19  Yes Magrinat, Virgie Dad, MD  lovastatin (MEVACOR) 20 MG tablet TAKE 1 TABLET BY MOUTH EVERYDAY AT BEDTIME Patient taking differently: Take 20 mg by mouth at bedtime.  01/18/19  Yes Magrinat, Virgie Dad, MD    Physical Exam: BP 127/78   Pulse 61   Temp 98.3 F (36.8 C) (Oral)   Resp 10   Ht 5' 9.5" (1.765 m)   Wt 91.6 kg   SpO2 95%   BMI 29.40 kg/m   Constitutional normal appearing well built male Eyes: EOMI, anicteric, normal conjunctivae ENMT: Oropharynx with moist mucous membranes Cardiovascular: RRR no MRGs, with no peripheral edema Respiratory: Normal respiratory effort, clear breath sounds  Abdomen: Soft,non-tender, normal bowel sounds Skin: No rash ulcers, or lesions. Without skin tenting  Neurologic: 5/5 strength in upper and lower, EOMI intact, no facial droop, no slurred speech Psychiatric:Appropriate affect, and mood. Mental status AAOx3          Labs on Admission:  Basic Metabolic Panel: Recent Labs  Lab 07/28/19 1000 08/01/19 1218 08/01/19 1227  NA 137 139 139  K 3.9 3.6 3.5  CL 102 101 100  CO2 26 25  --   GLUCOSE 94 98 95  BUN 24* 23 22  CREATININE 1.30* 1.33* 1.30*  CALCIUM 8.9 9.3  --    Liver Function Tests: Recent Labs  Lab 07/28/19 1000 08/01/19 1218  AST 17 21  ALT 16 18  ALKPHOS 36* 35*  BILITOT 0.8 1.0  PROT 6.8 7.4  ALBUMIN 4.0 4.9   No results for input(s): LIPASE, AMYLASE in the last 168 hours. No results for input(s): AMMONIA in the last 168 hours. CBC: Recent Labs  Lab 07/28/19 1000 08/01/19 1218 08/01/19 1227  WBC 4.4 4.6  --   NEUTROABS 2.3 2.9  --   HGB 12.6* 13.2 13.9  HCT 39.0 40.5 41.0  MCV 87.1 86.9  --   PLT 299 301  --    Cardiac Enzymes: No  results for input(s): CKTOTAL, CKMB, CKMBINDEX, TROPONINI in the last 168 hours.  BNP (last 3 results) No results for input(s): BNP in the last 8760 hours.  ProBNP (last 3 results) No results for input(s): PROBNP in the last 8760 hours.  CBG: Recent Labs  Lab 08/01/19 1218  GLUCAP 94    Radiological Exams on Admission: CT HEAD CODE STROKE WO CONTRAST  Result Date: 08/01/2019 CLINICAL DATA:  Code stroke. Neuro deficit, acute, stroke suspected. Additional history provided: Weight lifter, began to experience tingling of the left side of head and left arm around 8 a.m. today, patient reports feeling off balance, patient currently taking oral chemotherapy, history of mantle cell lymphoma. EXAM: CT HEAD WITHOUT CONTRAST TECHNIQUE: Contiguous axial images were obtained from the base of the skull through the vertex without intravenous contrast. COMPARISON:  PET-CT 05/09/2019, CT head 01/23/2015. FINDINGS: Brain:  There is limited assessment for intracranial manifestations of lymphoma on this noncontrast head CT. Stable, mild generalized parenchymal atrophy. There is a small cortically based ischemic infarct within the mid right frontal lobe (series 12, image 21) (series 4, image 30). This is new from prior head CT 01/23/2015 and age-indeterminate, although favored chronic given appearance. There is a small lacunar infarct within the superior left cerebellum which appears chronic and in retrospect was likely present on prior head CT 01/23/2015. Minimal ill-defined hypoattenuation within the cerebral white matter is nonspecific, but consistent with chronic small vessel ischemic disease. There is no acute intracranial hemorrhage. No extra-axial fluid collection. No evidence of intracranial mass. No midline shift. Vascular: No hyperdense vessel.  Atherosclerotic calcifications. Skull: Normal. Negative for fracture or focal lesion. Sinuses/Orbits: Visualized orbits show no acute finding. No significant paranasal  sinus disease or mastoid effusion at the imaged levels. ASPECTS [facility] Stroke Program Early CT Score) - Ganglionic level infarction (caudate, lentiform nuclei, internal capsule, insula, M1-M3 cortex): 7 - Supraganglionic infarction (M4-M6 cortex): 2 Total score (0-10 with 10 being normal): 10 These results were called by telephone at the time of interpretation on 08/01/2019 at 12:24 pm to provider [facility] , who verbally acknowledged these results. IMPRESSION: 1. A small cortical infarct within the mid right frontal lobe is age-indeterminate, but favored chronic given appearance. ASPECTS is 9. Consider brain MRI for further evaluation. 2. Small infarct within the superior left cerebellar hemisphere, which appears chronic and in retrospect, was likely present on prior head CT 01/23/2015. 3. Stable mild generalized parenchymal atrophy and chronic small vessel ischemic disease. 4. Please note there is limited assessment for intracranial manifestations of lymphoma on this non-contrast head CT. Electronically Signed   By: Kellie Simmering DO   On: 08/01/2019 12:25    EKG: Independently reviewed. normal sinus rhythm.  Assessment/Plan Present on Admission: . TIA (transient ischemic attack)  Active Problems:   TIA (transient ischemic attack)    <principal problem not specified> Acute onset left upper extremity numbness/tingling and mild left hemiataxia Ataxia witnessed by ED provider while patient was walking, still has some residual left wrist/hand numbness and tingling.  CT head with age-indeterminate cortical infarct.  Risk factors for CVA include cancer history, HLD and HTN. -Neurology consulted, obtain MRI brain, MRI head without con, with antianxiety medication given claustrophobia A1c, lipid panel PT/OT/speech therapy TTE, monitor on telemetry Has passed bedside swallow, allow diet -Has been started on aspirin 81 -Continue pravastatin -Frequent neurochecks -Permissive hypertension x24 hours  per neurology recommendations  Mantle cell Non-Hodgkin's lymphoma -Followed by Dr. Jana Hakim (oncology) on ibrutinib  HTN Allow permissive hypertension in setting of potential CVA -Hold home BP meds  Prior history of duodenal bleed, 2016  HLD LDL elevated on lipid panel -On lovastatin at home -Start pravastatin 80 mg here  CKD stage IIIa Creatinine stable at baseline of 1.3 -Avoid nephrotoxins -Monitor output -Monitor BMP        DVT prophylaxis: SCDs  Code Status: DNR, confirmed on day of admission  Family Communication: Family updated at bedside Disposition Plan: Expect less than 2 midnight stay while determining if this TIA versus stroke.  Expect patient to return home pending PT/OT evaluation.  Consults called: Neurology   Admission status: Admitted as observation to telemetry unit at [facility] (transfer from [facility] ED given need for neurology follow)      Desiree Hane MD [facility]  Pager [phone]  If 7PM-7AM, please contact night-coverage [email or web address] Password [email or web address]  08/01/2019, 4:13 PM

## 2019-08-02 ENCOUNTER — Observation Stay (HOSPITAL_BASED_OUTPATIENT_CLINIC_OR_DEPARTMENT_OTHER): Payer: Medicare HMO

## 2019-08-02 ENCOUNTER — Observation Stay (HOSPITAL_COMMUNITY): Payer: Medicare HMO

## 2019-08-02 DIAGNOSIS — E7800 Pure hypercholesterolemia, unspecified: Secondary | ICD-10-CM | POA: Diagnosis not present

## 2019-08-02 DIAGNOSIS — I255 Ischemic cardiomyopathy: Secondary | ICD-10-CM

## 2019-08-02 DIAGNOSIS — C8590 Non-Hodgkin lymphoma, unspecified, unspecified site: Secondary | ICD-10-CM | POA: Diagnosis not present

## 2019-08-02 DIAGNOSIS — I639 Cerebral infarction, unspecified: Secondary | ICD-10-CM | POA: Diagnosis not present

## 2019-08-02 DIAGNOSIS — I6302 Cerebral infarction due to thrombosis of basilar artery: Secondary | ICD-10-CM | POA: Diagnosis not present

## 2019-08-02 DIAGNOSIS — I6522 Occlusion and stenosis of left carotid artery: Secondary | ICD-10-CM | POA: Diagnosis not present

## 2019-08-02 DIAGNOSIS — C8319 Mantle cell lymphoma, extranodal and solid organ sites: Secondary | ICD-10-CM | POA: Diagnosis not present

## 2019-08-02 DIAGNOSIS — I10 Essential (primary) hypertension: Secondary | ICD-10-CM | POA: Diagnosis not present

## 2019-08-02 DIAGNOSIS — I351 Nonrheumatic aortic (valve) insufficiency: Secondary | ICD-10-CM

## 2019-08-02 LAB — TSH: TSH: 1.644 u[IU]/mL (ref 0.350–4.500)

## 2019-08-02 LAB — ECHOCARDIOGRAM COMPLETE
Height: 69.5 in
Weight: 3232 oz

## 2019-08-02 MED ORDER — HEPARIN (PORCINE) 25000 UT/250ML-% IV SOLN
1000.0000 [IU]/h | INTRAVENOUS | Status: DC
  Administered 2019-08-02: 1100 [IU]/h via INTRAVENOUS
  Filled 2019-08-02: qty 250

## 2019-08-02 MED ORDER — ATORVASTATIN CALCIUM 80 MG PO TABS
80.0000 mg | ORAL_TABLET | Freq: Every day | ORAL | Status: DC
  Administered 2019-08-02: 80 mg via ORAL
  Filled 2019-08-02: qty 1

## 2019-08-02 MED ORDER — GADOBUTROL 1 MMOL/ML IV SOLN
10.0000 mL | Freq: Once | INTRAVENOUS | Status: AC | PRN
  Administered 2019-08-02: 10 mL via INTRAVENOUS

## 2019-08-02 MED ORDER — CHLORHEXIDINE GLUCONATE CLOTH 2 % EX PADS
6.0000 | MEDICATED_PAD | Freq: Every day | CUTANEOUS | Status: DC
  Administered 2019-08-03: 6 via TOPICAL

## 2019-08-02 MED ORDER — CLOPIDOGREL BISULFATE 75 MG PO TABS
75.0000 mg | ORAL_TABLET | Freq: Every day | ORAL | Status: DC
  Administered 2019-08-02 – 2019-08-03 (×2): 75 mg via ORAL
  Filled 2019-08-02 (×2): qty 1

## 2019-08-02 NOTE — Plan of Care (Signed)
  Problem: Education: Goal: Knowledge of General Education information will improve Description: Including pain rating scale, medication(s)/side effects and non-pharmacologic comfort measures Outcome: Progressing   Problem: Activity: Goal: Risk for activity intolerance will decrease Outcome: Progressing   Problem: Coping: Goal: Level of anxiety will decrease Outcome: Progressing   Problem: Safety: Goal: Ability to remain free from injury will improve Outcome: Progressing   Problem: Education: Goal: Knowledge of disease or condition will improve Outcome: Progressing Goal: Knowledge of secondary prevention will improve Outcome: Progressing

## 2019-08-02 NOTE — Progress Notes (Signed)
  Echocardiogram 2D Echocardiogram has been performed.  Darlina Sicilian M 08/02/2019, 10:45 AM

## 2019-08-02 NOTE — Progress Notes (Signed)
PROGRESS NOTE    [name]  HAL:[id number] DOB: [birth year] DOA: 08/01/2019 PCP: Shelda Pal, DO     Brief Narrative:  69 y.o. BM PMHx non-Hodgkin's lymphoma on oral chemotherapy, HTN, HLD   Presents on 08/01/2019 with sudden episode of left-sided numbness and tingling. Patient states he was in his usual state of health.  He often lifts weights and does 150 push-ups every day.  Shortly after finishing his exercise regimen while watering his lawn around 8 AM he noted feeling "off" with increased numbness on the left side of his face as well as a similar numbness and tingling in his left wrist and shoulder.  He stated he felt like he was off balance but that it would go away on its own.  He was able to drive his son to work when the feeling had not gone away after an hour and a half him and his wife drove to the emergency department.  He also reports a sensation of left ear fullness/increased pressure. He is followed by Dr. Jana Hakim receiving oral chemotherapy for non-Hodgkin's lymphoma with no complications.  Concerning his hypertension he states he is adherent with his blood pressure regimen regimen  Concerning his hyperlipidemia he also reports adherence with his statin therapy.    ED course: Afebrile heart rate range 75-80, blood pressure 145/75.  SARS PCR negative. Creatinine 1.3 (consistent with baseline).  Otherwise labs unremarkable.  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  Given concern for TIA versus stroke teleneurology was consulted who recommended initiation of aspirin 81 mg and activate stroke protocol admission/order set. 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 Chronic superior L cerebellar infarct. Small vessel disease. Atrophy. ASPECTS 10.    -MRI   RIGHT dorsal pontine infarct. Old R frontal lobe infarct. Few scattered old B cerebellar infarcts. -MRA  Unremarkable  -Carotid Doppler  L ICA 60-79% (incidental). L VA retrograde flow. -2D Echo EF 35-40%. LA moderately dilated. No source of embolus  -LDL =155  -HgbA1c =6.1 consistent with prediabetes -Urine drug screen; negative  -No antithrombotic prior to admission, now on aspirin 81 mg daily and clopidogrel 75 mg daily. Continue DAPT x 3 weeks then aspirin alone -Pt will follow up with stroke clinic NP at [facility] in about 4 weeks  Paroxysmal atrial fibrillation -Rate controlled -TSH pending -Heparin per pharmacy -Spoke with [place] cardiology will see patient in A.m. -Per patient does not have a cardiologist  Acute Systolic and Diastolic CHF/Cardiomyopathy -EF 35 to 40% see results below -Medication normally started on CHF patient cannot be started on this patient secondary to acute stroke. -We will await cardiac recommendations.  Carotid stenosis -CUS left ICA 60-79% stenosis -Asymptomatic at this time -Recommend vascular surgery referral as outpt for follow up and monitoring  Hypertension -Home meds:  Lisinopril and HCTZ (hold) -Stable -Hold all hypertensive medication -Permissive hypertension (OK if < 220/120)  but gradually normalize in 2-3 days -Long-term BP goal normotensive  Hyperlipidemia -Home meds:  mevacor 20  -Now on lipitor 80  -LDL 155, goal < 70 -Continue statin at discharge  Other Stroke Risk Factors  Advanced age  Hx stroke on  imaging  Other Active Problems  Lymphoma on chemo     DVT prophylaxis: Heparin drip Code Status: Full Family Communication:  Status is: Inpatient    Dispo: The patient is from: Home              Anticipated d/c is to: Home              Anticipated d/c date is: 6/30              Patient currently unstable new heart failure      Consultants:  Stroke team    Procedures/Significant Events:  6/28 CT head stroke W0 contrast;-A small cortical infarct within the mid right frontal lobe is age-indeterminate, but favored chronic given appearance. ASPECTS is - Small infarct within the superior left cerebellar hemisphere, which appears chronic and in retrospect, was likely present on prior head CT 01/23/2015. -Stable mild generalized parenchymal atrophy and chronic small vessel ischemic disease. 6/28 MRI head;-5 mm acute ischemic nonhemorrhagic right dorsal pontine infarct. -Small remote cortical infarct involving the high anterior right frontal lobe, corresponding with abnormality seen on prior CT. -Few additional scattered small remote bilateral cerebellar infarcts. 6/29 echocardiogram;LVEF= 35 to 40%. The left ventricle demonstrates global hypokinesis.  -Grade I diastolic dysfunction (impaired relaxation).  Left Atrium: moderately dilated.      I have personally reviewed and interpreted all radiology studies and my findings are as above.  VENTILATOR SETTINGS:    Cultures 6/28 SARS coronavirus negative  Antimicrobials:    Devices    LINES / TUBES:      Continuous Infusions:   Objective: Vitals:   08/02/19 0537 08/02/19 0737 08/02/19 1030 08/02/19 1154  BP: 106/64 117/65 136/89 113/67  Pulse: (!) 56 68 (!) 106 68  Resp: (!) 22 17 17 20   Temp: 97.9 F (36.6 C) 97.7 F (36.5 C) 98.6 F (37 C) 98.4 F (36.9 C)  TempSrc: Oral Oral  Oral  SpO2: 98% 99% 95% 99%  Weight:      Height:       No intake or output data in the 24 hours ending 08/02/19  1624 Filed Weights   08/01/19 1139  Weight: 91.6 kg    Examination:  General: A/O x4 no acute respiratory distress Eyes: negative scleral hemorrhage, negative anisocoria, negative icterus ENT: Negative Runny nose, negative gingival bleeding, Neck:  Negative scars, masses, torticollis, lymphadenopathy, JVD Lungs: Clear to auscultation bilaterally without wheezes or crackles Cardiovascular: Regular rate and rhythm without murmur gallop or rub normal S1 and S2 Abdomen: negative abdominal pain, nondistended, positive soft, bowel sounds, no rebound, no ascites, no appreciable mass Extremities: No significant cyanosis, clubbing, or edema bilateral lower extremities Skin: Negative rashes, lesions, ulcers Psychiatric:  Negative depression, negative anxiety, negative fatigue, negative mania  Central nervous system:  Cranial nerves II through XII intact, tongue/uvula midline, all extremities muscle strength 5/5, sensation intact throughout, finger nose finger bilateral within normal limits, quick finger touch bilateral within normal limits, negative dysarthria, negative expressive aphasia, negative receptive aphasia.  .     Data Reviewed: Care during the described time interval was provided by me .  I have reviewed this patient's available data, including medical history, events of note, physical examination, and all test  results as part of my evaluation.  CBC: Recent Labs  Lab 07/28/19 1000 08/01/19 1218 08/01/19 1227  WBC 4.4 4.6  --   NEUTROABS 2.3 2.9  --   HGB 12.6* 13.2 13.9  HCT 39.0 40.5 41.0  MCV 87.1 86.9  --   PLT 299 301  --    Basic Metabolic Panel: Recent Labs  Lab 07/28/19 1000 08/01/19 1218 08/01/19 1227  NA 137 139 139  K 3.9 3.6 3.5  CL 102 101 100  CO2 26 25  --   GLUCOSE 94 98 95  BUN 24* 23 22  CREATININE 1.30* 1.33* 1.30*  CALCIUM 8.9 9.3  --    GFR: Estimated Creatinine Clearance: 63.1 mL/min (A) (by C-G formula based on SCr of 1.3 mg/dL  (H)). Liver Function Tests: Recent Labs  Lab 07/28/19 1000 08/01/19 1218  AST 17 21  ALT 16 18  ALKPHOS 36* 35*  BILITOT 0.8 1.0  PROT 6.8 7.4  ALBUMIN 4.0 4.9   No results for input(s): LIPASE, AMYLASE in the last 168 hours. No results for input(s): AMMONIA in the last 168 hours. Coagulation Profile: Recent Labs  Lab 08/01/19 1218  INR 0.9   Cardiac Enzymes: No results for input(s): CKTOTAL, CKMB, CKMBINDEX, TROPONINI in the last 168 hours. BNP (last 3 results) No results for input(s): PROBNP in the last 8760 hours. HbA1C: Recent Labs    08/01/19 1218  HGBA1C 6.1*   CBG: Recent Labs  Lab 08/01/19 1218  GLUCAP 94   Lipid Profile: Recent Labs    08/01/19 1218  CHOL 227*  HDL 54  LDLCALC 155*  TRIG 91  CHOLHDL 4.2   Thyroid Function Tests: Recent Labs    08/01/19 1808  TSH 1.644   Anemia Panel: No results for input(s): VITAMINB12, FOLATE, FERRITIN, TIBC, IRON, RETICCTPCT in the last 72 hours. Sepsis Labs: No results for input(s): PROCALCITON, LATICACIDVEN in the last 168 hours.  Recent Results (from the past 240 hour(s))  SARS Coronavirus 2 by RT PCR (hospital order, performed in [facility] hospital lab) Nasopharyngeal Nasopharyngeal Swab     Status: None   Collection Time: 08/01/19  2:21 PM   Specimen: Nasopharyngeal Swab  Result Value Ref Range Status   SARS Coronavirus 2 NEGATIVE NEGATIVE Final    Comment: (NOTE) SARS-CoV-2 target nucleic acids are NOT DETECTED.  The SARS-CoV-2 RNA is generally detectable in upper and lower respiratory specimens during the acute phase of infection. The lowest concentration of SARS-CoV-2 viral copies this assay can detect is 250 copies / mL. A negative result does not preclude SARS-CoV-2 infection and should not be used as the sole basis for treatment or other patient management decisions.  A negative result may occur with improper specimen collection / handling, submission of specimen other than  nasopharyngeal swab, presence of viral mutation(s) within the areas targeted by this assay, and inadequate number of viral copies (<250 copies / mL). A negative result must be combined with clinical observations, patient history, and epidemiological information.  Fact Sheet for Patients:   StrictlyIdeas.no  Fact Sheet for Healthcare Providers: BankingDealers.co.za  This test is not yet approved or  cleared by the [place] FDA and has been authorized for detection and/or diagnosis of SARS-CoV-2 by FDA under an Emergency Use Authorization (EUA).  This EUA will remain in effect (meaning this test can be used) for the duration of the COVID-19 declaration under Section 564(b)(1) of the Act, 21 U.S.C. section 360bbb-3(b)(1), unless the authorization is terminated or  revoked sooner.  Performed at [facility], [place] [address], [place], [place] [address]          Radiology Studies: MR ANGIO HEAD WO CONTRAST  Result Date: 08/02/2019 CLINICAL DATA:  Initial evaluation for left-sided paresthesias, gait imbalance. History of lymphoma. EXAM: MRI HEAD WITHOUT AND WITH CONTRAST MRA HEAD WITHOUT CONTRAST TECHNIQUE: Multiplanar, multiecho pulse sequences of the brain and surrounding structures were obtained without and with intravenous contrast. Angiographic images of the head were obtained using MRA technique without contrast. CONTRAST:  6mL GADAVIST GADOBUTROL 1 MMOL/ML IV SOLN COMPARISON:  Prior CT from 08/01/2019. FINDINGS: MRI HEAD FINDINGS Brain: Cerebral volume within normal limits for age. Few scattered subcentimeter foci of T2/FLAIR hyperintensity noted involving the periventricular and deep white matter both cerebral hemispheres, nonspecific, but most like related to chronic small vessel ischemic disease. Overall, changes are minimal for age. Small remote cortical infarct noted at the high anterior right frontal lobe,  corresponding with abnormality from prior CT (series 11, image 21). Few scattered small remote bilateral cerebellar infarcts noted as well. 5 mm focus of restricted diffusion seen involving the right dorsal pons, consistent with a small acute ischemic infarct (series 5, image 66). No associated hemorrhage or mass effect. No other abnormal foci of diffusion abnormality to suggest acute or subacute ischemia. Gray-white matter differentiation otherwise maintained. No foci of susceptibility artifact to suggest acute or chronic intracranial hemorrhage. No mass lesion, midline shift or mass effect. No hydrocephalus or extra-axial fluid collection. Pituitary gland suprasellar region within normal limits. Midline structures intact. No abnormal enhancement following contrast administration. Incidental note made of a left cerebellar DVA. Vascular: Major intracranial vascular flow voids are maintained. Skull and upper cervical spine: Craniocervical junction within normal limits. Bone marrow signal intensity normal. No focal marrow replacing lesion. No scalp soft tissue abnormality. Sinuses/Orbits: Globes and orbital soft tissues within normal limits. Paranasal sinuses are largely clear. Small left mastoid effusion noted, of doubtful significance. Other: None. MRA HEAD FINDINGS ANTERIOR CIRCULATION: Examination mildly degraded by motion artifact. Visualized distal cervical segments of the internal carotid arteries are widely patent with antegrade flow. Petrous segments widely patent. Mild atheromatous irregularity throughout the carotid siphons without flow-limiting stenosis. Right A1 segment widely patent. Left A1 hypoplastic and/or absent, accounting for the slightly diminutive left ICA is compared to the right. Normal anterior communicating artery complex. Anterior cerebral arteries widely patent to their distal aspects without stenosis. No M1 stenosis or occlusion. Normal MCA bifurcations. Distal MCA branches well perfused  and symmetric. POSTERIOR CIRCULATION: Vertebral arteries patent to the vertebrobasilar junction without stenosis. Right PICA patent. Left PICA not seen. Basilar somewhat diminutive and mildly tortuous but is widely patent to its distal aspect without stenosis. Superior cerebral arteries patent bilaterally. PCAs supplied via the basilar or as well as prominent and robust bilateral posterior communicating arteries. Both PCAs well perfused to their distal aspects without stenosis. No intracranial aneurysm. IMPRESSION: MRI HEAD IMPRESSION: 1. 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 2. Small remote cortical infarct involving the high anterior right frontal lobe, corresponding with abnormality seen on prior CT. 3. Few additional scattered small remote bilateral cerebellar infarcts. MRA HEAD IMPRESSION: Negative intracranial MRA. No large vessel occlusion, hemodynamically significant stenosis, or other acute vascular abnormality. Electronically Signed   By: Jeannine Boga M.D.   On: 08/02/2019 02:14   MR BRAIN W WO CONTRAST  Result Date: 08/02/2019 CLINICAL DATA:  Initial evaluation for left-sided paresthesias, gait imbalance. History of lymphoma. EXAM:  MRI HEAD WITHOUT AND WITH CONTRAST MRA HEAD WITHOUT CONTRAST TECHNIQUE: Multiplanar, multiecho pulse sequences of the brain and surrounding structures were obtained without and with intravenous contrast. Angiographic images of the head were obtained using MRA technique without contrast. CONTRAST:  67mL GADAVIST GADOBUTROL 1 MMOL/ML IV SOLN COMPARISON:  Prior CT from 08/01/2019. FINDINGS: MRI HEAD FINDINGS Brain: Cerebral volume within normal limits for age. Few scattered subcentimeter foci of T2/FLAIR hyperintensity noted involving the periventricular and deep white matter both cerebral hemispheres, nonspecific, but most like related to chronic small vessel ischemic disease. Overall, changes are minimal for age. Small remote cortical  infarct noted at the high anterior right frontal lobe, corresponding with abnormality from prior CT (series 11, image 21). Few scattered small remote bilateral cerebellar infarcts noted as well. 5 mm focus of restricted diffusion seen involving the right dorsal pons, consistent with a small acute ischemic infarct (series 5, image 66). No associated hemorrhage or mass effect. No other abnormal foci of diffusion abnormality to suggest acute or subacute ischemia. Gray-white matter differentiation otherwise maintained. No foci of susceptibility artifact to suggest acute or chronic intracranial hemorrhage. No mass lesion, midline shift or mass effect. No hydrocephalus or extra-axial fluid collection. Pituitary gland suprasellar region within normal limits. Midline structures intact. No abnormal enhancement following contrast administration. Incidental note made of a left cerebellar DVA. Vascular: Major intracranial vascular flow voids are maintained. Skull and upper cervical spine: Craniocervical junction within normal limits. Bone marrow signal intensity normal. No focal marrow replacing lesion. No scalp soft tissue abnormality. Sinuses/Orbits: Globes and orbital soft tissues within normal limits. Paranasal sinuses are largely clear. Small left mastoid effusion noted, of doubtful significance. Other: None. MRA HEAD FINDINGS ANTERIOR CIRCULATION: Examination mildly degraded by motion artifact. Visualized distal cervical segments of the internal carotid arteries are widely patent with antegrade flow. Petrous segments widely patent. Mild atheromatous irregularity throughout the carotid siphons without flow-limiting stenosis. Right A1 segment widely patent. Left A1 hypoplastic and/or absent, accounting for the slightly diminutive left ICA is compared to the right. Normal anterior communicating artery complex. Anterior cerebral arteries widely patent to their distal aspects without stenosis. No M1 stenosis or occlusion.  Normal MCA bifurcations. Distal MCA branches well perfused and symmetric. POSTERIOR CIRCULATION: Vertebral arteries patent to the vertebrobasilar junction without stenosis. Right PICA patent. Left PICA not seen. Basilar somewhat diminutive and mildly tortuous but is widely patent to its distal aspect without stenosis. Superior cerebral arteries patent bilaterally. PCAs supplied via the basilar or as well as prominent and robust bilateral posterior communicating arteries. Both PCAs well perfused to their distal aspects without stenosis. No intracranial aneurysm. IMPRESSION: MRI HEAD IMPRESSION: 1. 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 2. Small remote cortical infarct involving the high anterior right frontal lobe, corresponding with abnormality seen on prior CT. 3. Few additional scattered small remote bilateral cerebellar infarcts. MRA HEAD IMPRESSION: Negative intracranial MRA. No large vessel occlusion, hemodynamically significant stenosis, or other acute vascular abnormality. Electronically Signed   By: Jeannine Boga M.D.   On: 08/02/2019 02:14   ECHOCARDIOGRAM COMPLETE  Result Date: 08/02/2019    ECHOCARDIOGRAM REPORT   Patient Name:   [name] Date of Exam: 08/02/2019 Medical Rec #:  [id number]       Height:       69.5 in Accession #:    [id number]      Weight:       202.0 lb Date of Birth:  November 08, 1950  BSA:          2.085 m Patient Age:    51 years        BP:           136/89 mmHg Patient Gender: M               HR:           106 bpm. Exam Location:  Inpatient Procedure: 2D Echo and Strain Analysis Indications:    TIA 435.9 / G45.9  History:        Patient has prior history of Echocardiogram examinations, most                 recent 01/25/2015. Risk Factors:Hypertension and Dyslipidemia.                 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 Left carotid artery                 stenosis 60-79%.  Sonographer:    Darlina Sicilian RDCS Referring Phys: [id number]  [place]  1. Left ventricular ejection fraction, by estimation, is 35 to 40%. The left ventricle has moderately decreased function. The left ventricle demonstrates global hypokinesis. The left ventricular internal cavity size was borderline dilated. Left ventricular diastolic parameters are consistent with Grade I diastolic dysfunction (impaired relaxation).  2. Right ventricular systolic function is normal. The right ventricular size is normal. Tricuspid regurgitation signal is inadequate for assessing PA pressure.  3. Left atrial size was moderately dilated.  4. The mitral valve is normal in structure. No evidence of mitral valve regurgitation.  5. The aortic valve is tricuspid. Aortic valve regurgitation is mild to moderate. Mild to moderate aortic valve sclerosis/calcification is present, without any evidence of aortic stenosis.  6. Aortic dilatation noted. There is borderline dilatation of the ascending aorta measuring 38 mm.  7. The inferior vena cava is normal in size with greater than 50% respiratory variability, suggesting right atrial pressure of 3 mmHg. Comparison(s): Prior images unable to be directly viewed, comparison made by report only. The left ventricular function is significantly worse. Very frequent PVCs are seen during the study.  Left ventricular ejection fraction, by estimation, is 35 to 40%. The left ventricle has moderately decreased function. The left ventricle demonstrates global hypokinesis. The left ventricular internal cavity size was borderline dilated. There is no left  ventricular hypertrophy. Left ventricular diastolic parameters are consistent with Grade I diastolic dysfunction (impaired relaxation). Normal left ventricular filling pressure. Right Ventricle: The right ventricular size is normal. No increase in right ventricular wall thickness. Right ventricular systolic function is normal. Tricuspid regurgitation signal is inadequate for assessing PA pressure.  Left Atrium: Left atrial size was moderately dilated. Right Atrium: Right atrial size was normal in size. Pericardium: There is no evidence of pericardial effusion. Mitral Valve: The mitral valve is normal in structure. No evidence of mitral valve regurgitation. Tricuspid Valve: The tricuspid valve is normal in structure. Tricuspid valve regurgitation is not demonstrated. Aortic Valve: There is particularly dense calcification of the left coronary cusp. The aortic valve is tricuspid. Aortic valve regurgitation is mild to moderate. Mild to moderate aortic valve sclerosis/calcification is present, without any evidence of aortic stenosis. Aortic valve mean gradient measures 8.5 mmHg. Aortic valve peak gradient measures 20.2 mmHg. Aortic valve area, by VTI measures 2.25 cm. Pulmonic Valve: The pulmonic valve was grossly normal. Pulmonic valve regurgitation is not visualized. Aorta: Aortic dilatation noted. There is borderline dilatation of the  ascending aorta measuring 38 mm. Venous: The inferior vena cava is normal in size with greater than 50% respiratory variability, suggesting right atrial pressure of 3 mmHg. No atrial level shunt detected by color flow Doppler.  LEFT VENTRICLE PLAX 2D LVIDd:         5.50 cm  Diastology LVIDs:         4.40 cm  LV e' lateral:   7.29 cm/s LV PW:         0.90 cm  LV E/e' lateral: 6.8 LV IVS:        1.00 cm  LV e' medial:    4.46 cm/s LVOT diam:     2.40 cm  LV E/e' medial:  11.1 LV SV:         80 LV SV Index:   38 LVOT Area:     4.52 cm  RIGHT VENTRICLE RV S prime:     14.70 cm/s RVOT diam:      1.90 cm TAPSE (M-mode): 3.0 cm LEFT ATRIUM             Index       RIGHT ATRIUM           Index LA diam:        4.50 cm 2.16 cm/m  RA Area:     16.70 cm LA Vol (A2C):   52.4 ml 25.13 ml/m RA Volume:   42.50 ml  20.38 ml/m LA Vol (A4C):   64.0 ml 30.69 ml/m LA Biplane Vol: 60.1 ml 28.82 ml/m  AORTIC VALVE                    PULMONIC VALVE AV Area (Vmax):    1.96 cm     RVOT Peak grad: 3  mmHg AV Area (Vmean):   2.04 cm AV Area (VTI):     2.25 cm AV Vmax:           225.00 cm/s AV Vmean:          136.000 cm/s AV VTI:            0.355 m AV Peak Grad:      20.2 mmHg AV Mean Grad:      8.5 mmHg LVOT Vmax:         97.50 cm/s LVOT Vmean:        61.200 cm/s LVOT VTI:          0.176 m LVOT/AV VTI ratio: 0.50  AORTA Ao Root diam: 3.60 cm Ao Asc diam:  3.80 cm MITRAL VALVE MV Area (PHT): 2.95 cm    SHUNTS MV Decel Time: 257 msec    Systemic VTI:  0.18 m MV E velocity: 49.70 cm/s  Systemic Diam: 2.40 cm MV A velocity: 61.70 cm/s  Pulmonic VTI:  0.163 m MV E/A ratio:  0.81        Pulmonic Diam: 1.90 cm                            Qp/Qs:         0.58 Mihai Croitoru MD Electronically signed by Sanda Klein MD Signature Date/Time: 08/02/2019/11:29:09 AM    Final    CT HEAD CODE STROKE WO CONTRAST  Result Date: 08/01/2019 CLINICAL DATA:  Code stroke. Neuro deficit, acute, stroke suspected. Additional history provided: Weight lifter, began to experience tingling of the left side of head and left arm around 8 a.m. today, patient reports feeling  off balance, patient currently taking oral chemotherapy, history of mantle cell lymphoma. EXAM: CT HEAD WITHOUT CONTRAST TECHNIQUE: Contiguous axial images were obtained from the base of the skull through the vertex without intravenous contrast. COMPARISON:  PET-CT 05/09/2019, CT head 01/23/2015. FINDINGS: Brain: There is limited assessment for intracranial manifestations of lymphoma on this noncontrast head CT. Stable, mild generalized parenchymal atrophy. There is a small cortically based ischemic infarct within the mid right frontal lobe (series 12, image 21) (series 4, image 30). This is new from prior head CT 01/23/2015 and age-indeterminate, although favored chronic given appearance. There is a small lacunar infarct within the superior left cerebellum which appears chronic and in retrospect was likely present on prior head CT 01/23/2015. Minimal ill-defined  hypoattenuation within the cerebral white matter is nonspecific, but consistent with chronic small vessel ischemic disease. There is no acute intracranial hemorrhage. No extra-axial fluid collection. No evidence of intracranial mass. No midline shift. Vascular: No hyperdense vessel.  Atherosclerotic calcifications. Skull: Normal. Negative for fracture or focal lesion. Sinuses/Orbits: Visualized orbits show no acute finding. No significant paranasal sinus disease or mastoid effusion at the imaged levels. ASPECTS [facility] Stroke Program Early CT Score) - Ganglionic level infarction (caudate, lentiform nuclei, internal capsule, insula, M1-M3 cortex): 7 - Supraganglionic infarction (M4-M6 cortex): 2 Total score (0-10 with 10 being normal): 10 These results were called by telephone at the time of interpretation on 08/01/2019 at 12:24 pm to provider [facility] , who verbally acknowledged these results. IMPRESSION: 1. A small cortical infarct within the mid right frontal lobe is age-indeterminate, but favored chronic given appearance. ASPECTS is 9. Consider brain MRI for further evaluation. 2. Small infarct within the superior left cerebellar hemisphere, which appears chronic and in retrospect, was likely present on prior head CT 01/23/2015. 3. Stable mild generalized parenchymal atrophy and chronic small vessel ischemic disease. 4. Please note there is limited assessment for intracranial manifestations of lymphoma on this non-contrast head CT. Electronically Signed   By: Kellie Simmering DO   On: 08/01/2019 12:25   VAS US CAROTID (at [facility] and [facility] only)  Result Date: 08/02/2019 Carotid Arterial Duplex Study Indications:       TIA and Numbness. Risk Factors:      Hypertension, hyperlipidemia. Other Factors:     Non-Hodgkin Lymphoma. Limitations        Today's exam was limited due to patient anatomy. Comparison Study:  No prior study Performing Technologist: Maudry Mayhew MHA, RDMS, RVT, RDCS  Examination Guidelines: A  complete evaluation includes B-mode imaging, spectral Doppler, color Doppler, and power Doppler as needed of all accessible portions of each vessel. Bilateral testing is considered an integral part of a complete examination. Limited examinations for reoccurring indications may be performed as noted.  Right Carotid Findings: +----------+--------+--------+--------+-----------------------+--------+           PSV cm/sEDV cm/sStenosisPlaque Description     Comments +----------+--------+--------+--------+-----------------------+--------+ CCA Prox  1       0                                               +----------+--------+--------+--------+-----------------------+--------+ CCA Distal1       0               smooth and heterogenous         +----------+--------+--------+--------+-----------------------+--------+ ICA Prox  1  0               smooth and homogeneous          +----------+--------+--------+--------+-----------------------+--------+ ICA Distal1       0                                               +----------+--------+--------+--------+-----------------------+--------+ ECA       1       0               smooth and heterogenous         +----------+--------+--------+--------+-----------------------+--------+ +----------+--------+-------+----------------+-------------------+           PSV cm/sEDV cmsDescribe        Arm Pressure (mmHG) +----------+--------+-------+----------------+-------------------+ Jarrett Ables              Multiphasic, [id number]                 +----------+--------+-------+----------------+-------------------+ +---------+--------+-+--------+-+---------+ VertebralPSV cm/s0EDV cm/s0Antegrade +---------+--------+-+--------+-+---------+  Left Carotid Findings: +----------+--------+-------+--------+--------------------------------+--------+           PSV cm/sEDV    StenosisPlaque Description              Comments                    cm/s                                                    +----------+--------+-------+--------+--------------------------------+--------+ CCA Prox  1       0                                                       +----------+--------+-------+--------+--------------------------------+--------+ CCA Distal1       0              smooth, heterogenous and                                                  calcific                                 +----------+--------+-------+--------+--------------------------------+--------+ ICA Prox  3       1      60-79%  smooth, heterogenous and                                                  calcific                                 +----------+--------+-------+--------+--------------------------------+--------+ ICA Distal1       0                                                       +----------+--------+-------+--------+--------------------------------+--------+  ECA       1       0                                                       +----------+--------+-------+--------+--------------------------------+--------+ +----------+--------+--------+------------+-------------------+           PSV cm/sEDV cm/sDescribe    Arm Pressure (mmHG) +----------+--------+--------+------------+-------------------+ Subclavian                Not assessed134                 +----------+--------+--------+------------+-------------------+ +---------+--------+--+--------+--+----------+ VertebralPSV cm/s-1EDV cm/s-0Retrograde +---------+--------+--+--------+--+----------+   Summary: Right Carotid: Velocities in the right ICA are consistent with a 1-39% stenosis. Left Carotid: Velocities in the left ICA are consistent with a 60-79% stenosis. Vertebrals:  Right vertebral artery demonstrates antegrade flow. Left vertebral              artery demonstrates retrograde flow. Subclavians: Left subclavian artery was not visualized. Normal flow  hemodynamics              were seen in the right subclavian artery. *See table(s) above for measurements and observations.  Electronically signed by Antony Contras MD on 08/02/2019 at 8:47:01 AM.    Final         Scheduled Meds: .  stroke: mapping our early stages of recovery book   Does not apply Once  . aspirin EC  81 mg Oral Daily  . atorvastatin  80 mg Oral Daily  . Chlorhexidine Gluconate Cloth  6 each Topical Daily  . clopidogrel  75 mg Oral Daily   Continuous Infusions:   LOS: 0 days    Time spent:40 min    Fateh Kindle, Geraldo Docker, MD [facility] Pager [phone]  If 7PM-7AM, please contact night-coverage [email or web address] Password [facility] 08/02/2019, 4:24 PM

## 2019-08-02 NOTE — Progress Notes (Signed)
OT Cancellation Note  Patient Details Name: [name] MRN: [id number] DOB: 04-02-1950   Cancelled Treatment:    Reason Eval/Treat Not Completed: OT screened, no needs identified, will sign off. Patient with no OT needs identified, no deficits seen in vision, cognition, balance, sensation or strength-pt reporting at baseline. Please re-consult if further needs arise.   [name], OT Acute Rehabilitation Services Pager [phone] Office [phone]   [name] 08/02/2019, 12:25 PM

## 2019-08-02 NOTE — Evaluation (Signed)
Speech Language Pathology Evaluation Patient Details Name: [name] MRN: [id number] DOB: 27-Dec-1950 Today's Date: 08/02/2019 Time: 2440-1027 SLP Time Calculation (min) (ACUTE ONLY): 20 min  Problem List:  Patient Active Problem List   Diagnosis Date Noted  . TIA (transient ischemic attack) 08/01/2019  . Left sided numbness 08/01/2019  . Cognitive and behavioral changes 04/25/2019  . Pre-syncope 04/25/2019  . Bleeding ulcer with perforation ([place]) 02/09/2015  . 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 GI bleed 02/07/2015  . Symptomatic anemia 02/07/2015  . 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 [facility]) 01/25/2015  . Acute GI bleeding   . Intraabdominal mass 01/21/2015  . Mediastinal mass   . Essential hypertension   . HLD (hyperlipidemia)   . Acute blood loss anemia    Past Medical History:  Past Medical History:  Diagnosis Date  . Anemia   . Cancer ([place])    Lymphoma  . Essential hypertension   . GERD (gastroesophageal reflux disease)   . GI bleed   . HLD (hyperlipidemia)   . Weakness    Past Surgical History:  Past Surgical History:  Procedure Laterality Date  . ESOPHAGOGASTRODUODENOSCOPY Left 01/18/2015   Procedure: ESOPHAGOGASTRODUODENOSCOPY (EGD);  Surgeon: Wilford Corner, MD;  Location: [facility] ENDOSCOPY;  Service: Endoscopy;  Laterality: Left;  . INGUINAL HERNIA REPAIR Right 01/22/2015   Procedure: RIGHT INGUINAL LYMPH NODE BX;  Surgeon: Donnie Mesa, MD;  Location: [place];  Service: General;  Laterality: Right;  . MEDIASTINOSCOPY N/A 05/05/2017   Procedure: MEDIASTINOSCOPY;  Surgeon: Grace Isaac, MD;  Location: [place];  Service: Thoracic;  Laterality: N/A;  . PORTACATH PLACEMENT  01/26/2015    power port with tip SVC/RA Junction  . SKIN SURGERY     Small benign cysts over left scalp removed  . 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  Pt PMH + for lymphoma undergoing oral chemo, GERD, anemia.  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  He drives his family to places given his son has arthritis and manages his family's yards.  Pt states his left sided tingling has resolved.   Assessment / Plan / Recommendation Clinical Impression  2016 Cognistat administered to pt (except contructional ability due to pt's decreased vision and no glasses present).  CN exam unremarkable for speech.  Pt scored in average range on all subtests except memory - with score being 7 - between mild and moderate impairment.  Pt only missed 5 questions of the entire test.  He has no aphasia or dysarthria - and able to follow multi-step complex directions.   SlP reviewed findings with pt and education completed, no follow up.  Thanks for this consult.  Scores: orientation 12, attention 9, comprehension 5, repetition 12, naming 7, memory 7, calculation 4, similarities 6, judgement 6 - did not test construction due to vision    SLP Assessment  SLP Recommendation/Assessment: Patient does not need any further Speech Lanaguage Pathology Services SLP Visit Diagnosis: Cognitive communication deficit (R41.841)    Follow Up Recommendations  None    Frequency and Duration  SLP Evaluation Cognition  Arousal/Alertness: Awake/alert Orientation Level: Oriented X4 Memory: Impaired Memory Impairment: Retrieval deficit (pt recalled 1/4 words I, 2/4 with category cue and 1/4 with multiple choice)       Comprehension  Auditory Comprehension Overall Auditory Comprehension: Appears within functional limits for tasks assessed Yes/No  Questions: Not tested Commands: Within Functional Limits (complex directions followed without difficulty) Visual Recognition/Discrimination Discrimination: Within Function Limits Reading Comprehension Reading Status: Within funtional limits    Expression Expression Primary Mode of Expression: Verbal Verbal Expression Overall Verbal Expression: Appears within functional limits for tasks assessed Initiation: No impairment Repetition: No impairment (difficulty at 7 number sequence) Naming: No impairment (did not name anchor) Written Expression Dominant Hand: Right Written Expression: Not tested   Oral / Motor  Oral Motor/Sensory Function Overall Oral Motor/Sensory Function: Within functional limits Motor Speech Overall Motor Speech: Appears within functional limits for tasks assessed Respiration: Within functional limits Resonance: Within functional limits Articulation: Within functional limitis Intelligibility: Intelligible Motor Planning: Witnin functional limits Motor Speech Errors: Not applicable   GO                    [name] 08/02/2019, 8:46 AM  Kathleen Lime, MS [place] Office [phone]

## 2019-08-02 NOTE — Evaluation (Signed)
Physical Therapy Evaluation Patient Details Name: [name] MRN: [id number] DOB: [birth year] Today's Date: 08/02/2019   History of Present Illness  [name] is a 69 y.o. male with medical history significant for non-Hodgkin's lymphoma on oral chemotherapy, HTN, HLD who presents on 08/01/2019 with sudden episode of left-sided numbness and tingling.  MRI positive for acute ischemic infarct R dorsal pons.  Clinical Impression  Patient presents with independent mobility and no skilled PT needs.  Did review stroke education and advised not to return to work out routine until cleared by MD and noting no symptoms.  Patient in agreement.  Will sign off.     Follow Up Recommendations No PT follow up    Equipment Recommendations  None recommended by PT    Recommendations for Other Services       Precautions / Restrictions Precautions Precautions: None      Mobility  Bed Mobility Overal bed mobility: Independent                Transfers Overall transfer level: Independent                  Ambulation/Gait Ambulation/Gait assistance: Independent Gait Distance (Feet): 200 Feet Assistive device: None Gait Pattern/deviations: WFL(Within Functional Limits)     General Gait Details: performed DGI, score 24/24  Stairs            Wheelchair Mobility    Modified Rankin (Stroke Patients Only) Modified Rankin (Stroke Patients Only) Pre-Morbid Rankin Score: No symptoms Modified Rankin: No significant disability     Balance Overall balance assessment: Independent                               Standardized Balance Assessment Standardized Balance Assessment : Dynamic Gait Index   Dynamic Gait Index Level Surface: Normal Change in Gait Speed: Normal Gait with Horizontal Head Turns: Normal Gait with Vertical Head Turns: Normal Gait and Pivot Turn: Normal Step Over Obstacle: Normal Step Around Obstacles: Normal Steps: Normal Total  Score: 24       Pertinent Vitals/Pain Pain Assessment: No/denies pain    Home Living Family/patient expects to be discharged to:: Private residence Living Arrangements: Spouse/significant other Available Help at Discharge: Family;Available 24 hours/day Type of Home: House Home Access: Stairs to enter Entrance Stairs-Rails: Can reach both Entrance Stairs-Number of Steps: 3 Home Layout: One level Home Equipment: None Additional Comments: retired, drives and mows lawns, works out    Prior Function Level of Independence: Independent               Journalist, newspaper   Dominant Hand: Right    Extremity/Trunk Assessment   Upper Extremity Assessment Upper Extremity Assessment: Overall WFL for tasks assessed    Lower Extremity Assessment Lower Extremity Assessment: Overall WFL for tasks assessed       Communication   Communication: No difficulties  Cognition Arousal/Alertness: Awake/alert Behavior During Therapy: WFL for tasks assessed/performed Overall Cognitive Status: Within Functional Limits for tasks assessed                                        General Comments General comments (skin integrity, edema, etc.): Reviewed stroke education BE FAST    Exercises     Assessment/Plan    PT Assessment Patent does not need any further PT services  PT Problem  List         PT Treatment Interventions      PT Goals (Current goals can be found in the Care Plan section)  Acute Rehab PT Goals PT Goal Formulation: All assessment and education complete, DC therapy    Frequency     Barriers to discharge        Co-evaluation               AM-PAC PT "6 Clicks" Mobility  Outcome Measure Help needed turning from your back to your side while in a flat bed without using bedrails?: None Help needed moving from lying on your back to sitting on the side of a flat bed without using bedrails?: None Help needed moving to and from a bed to a chair  (including a wheelchair)?: None Help needed standing up from a chair using your arms (e.g., wheelchair or bedside chair)?: None Help needed to walk in hospital room?: None Help needed climbing 3-5 steps with a railing? : None 6 Click Score: 24    End of Session   Activity Tolerance: Patient tolerated treatment well Patient left: in bed;with call bell/phone within reach   PT Visit Diagnosis: Other symptoms and signs involving the nervous system (R29.898)    Time: 9480-1655 PT Time Calculation (min) (ACUTE ONLY): 27 min   Charges:   PT Evaluation $PT Eval Low Complexity: 1 Low PT Treatments $Self Care/Home Management: 8-22        [place] [phone] Office:[phone] 08/02/2019   Reginia Naas 08/02/2019, 12:28 PM

## 2019-08-02 NOTE — Progress Notes (Signed)
[place] for Heparin Indication: PAF / CVA  No Known Allergies  Patient Measurements: Height: 5' 9.5" (176.5 cm) Weight: 91.6 kg (202 lb) IBW/kg (Calculated) : 71.85 Heparin Dosing Weight: 92  Vital Signs: Temp: 97.8 F (36.6 C) (06/29 1702) Temp Source: Oral (06/29 1702) BP: 135/92 (06/29 1702) Pulse Rate: 76 (06/29 1702)  Labs: Recent Labs    08/01/19 1218 08/01/19 1227  HGB 13.2 13.9  HCT 40.5 41.0  PLT 301  --   APTT 27  --   LABPROT 11.8  --   INR 0.9  --   CREATININE 1.33* 1.30*    Estimated Creatinine Clearance: 63.1 mL/min (A) (by C-G formula based on SCr of 1.3 mg/dL (H)).  Assessment: 69 year old male to begin heparin for PAfib / CVA.   Scr stable Baseline CBC stable  Goal of Therapy:  Heparin level 0.3-0.5 units/ml Monitor platelets by anticoagulation protocol: Yes   Plan:  No heparin bolus per CVA protocol Heparin drip at 1100 units / hr  8 hour heparin level and CBC Daily heparin level, CBC  Thank you Anette Guarneri, PharmD [phone] 08/02/2019,5:45 PM

## 2019-08-02 NOTE — Progress Notes (Signed)
STROKE TEAM PROGRESS NOTE   INTERVAL HISTORY Pt wife is at bedside. Pt is sitting in bed, stated that he was mowing the lawn yesterday, had sudden onset patchy areas of left hand and arm numbness. He also felt some imbalance. He continued to mow. After he finished the work, he still felt wobbly on walking which made him come to ER for evaluation. He denies any hx of stroke in the past.  Vitals:   08/02/19 0337 08/02/19 0537 08/02/19 0737 08/02/19 1030  BP: 93/62 106/64 117/65 136/89  Pulse:  (!) 56 68 (!) 106  Resp: 15 (!) 22 17 17   Temp: 98 F (36.7 C) 97.9 F (36.6 C) 97.7 F (36.5 C) 98.6 F (37 C)  TempSrc: Oral Oral Oral   SpO2: 100% 98% 99% 95%  Weight:      Height:        CBC:  Recent Labs  Lab 07/28/19 1000 07/28/19 1000 08/01/19 1218 08/01/19 1227  WBC 4.4  --  4.6  --   NEUTROABS 2.3  --  2.9  --   HGB 12.6*   < > 13.2 13.9  HCT 39.0   < > 40.5 41.0  MCV 87.1  --  86.9  --   PLT 299  --  301  --    < > = values in this interval not displayed.    Basic Metabolic Panel:  Recent Labs  Lab 07/28/19 1000 07/28/19 1000 08/01/19 1218 08/01/19 1227  NA 137   < > 139 139  K 3.9   < > 3.6 3.5  CL 102   < > 101 100  CO2 26  --  25  --   GLUCOSE 94   < > 98 95  BUN 24*   < > 23 22  CREATININE 1.30*   < > 1.33* 1.30*  CALCIUM 8.9  --  9.3  --    < > = values in this interval not displayed.   Lipid Panel:     Component Value Date/Time   CHOL 227 (H) 08/01/2019 1218   TRIG 91 08/01/2019 1218   HDL 54 08/01/2019 1218   CHOLHDL 4.2 08/01/2019 1218   VLDL 18 08/01/2019 1218   LDLCALC 155 (H) 08/01/2019 1218   HgbA1c:  Lab Results  Component Value Date   HGBA1C 6.1 (H) 08/01/2019   Urine Drug Screen:     Component Value Date/Time   LABOPIA NONE DETECTED 08/01/2019 1426   COCAINSCRNUR NONE DETECTED 08/01/2019 1426   LABBENZ NONE DETECTED 08/01/2019 1426   AMPHETMU NONE DETECTED 08/01/2019 1426   THCU NONE DETECTED 08/01/2019 1426   LABBARB NONE  DETECTED 08/01/2019 1426    Alcohol Level     Component Value Date/Time   ETH <10 08/01/2019 1218    IMAGING past 24 hours MR ANGIO HEAD WO CONTRAST  Result Date: 08/02/2019 CLINICAL DATA:  Initial evaluation for left-sided paresthesias, gait imbalance. History of lymphoma. EXAM: MRI HEAD WITHOUT AND WITH CONTRAST MRA HEAD WITHOUT CONTRAST TECHNIQUE: Multiplanar, multiecho pulse sequences of the brain and surrounding structures were obtained without and with intravenous contrast. Angiographic images of the head were obtained using MRA technique without contrast. CONTRAST:  78mL GADAVIST GADOBUTROL 1 MMOL/ML IV SOLN COMPARISON:  Prior CT from 08/01/2019. FINDINGS: MRI HEAD FINDINGS Brain: Cerebral volume within normal limits for age. Few scattered subcentimeter foci of T2/FLAIR hyperintensity noted involving the periventricular and deep white matter both cerebral hemispheres, nonspecific, but most like related to chronic small  vessel ischemic disease. Overall, changes are minimal for age. Small remote cortical infarct noted at the high anterior right frontal lobe, corresponding with abnormality from prior CT (series 11, image 21). Few scattered small remote bilateral cerebellar infarcts noted as well. 5 mm focus of restricted diffusion seen involving the right dorsal pons, consistent with a small acute ischemic infarct (series 5, image 66). No associated hemorrhage or mass effect. No other abnormal foci of diffusion abnormality to suggest acute or subacute ischemia. Gray-white matter differentiation otherwise maintained. No foci of susceptibility artifact to suggest acute or chronic intracranial hemorrhage. No mass lesion, midline shift or mass effect. No hydrocephalus or extra-axial fluid collection. Pituitary gland suprasellar region within normal limits. Midline structures intact. No abnormal enhancement following contrast administration. Incidental note made of a left cerebellar DVA. Vascular: Major  intracranial vascular flow voids are maintained. Skull and upper cervical spine: Craniocervical junction within normal limits. Bone marrow signal intensity normal. No focal marrow replacing lesion. No scalp soft tissue abnormality. Sinuses/Orbits: Globes and orbital soft tissues within normal limits. Paranasal sinuses are largely clear. Small left mastoid effusion noted, of doubtful significance. Other: None. MRA HEAD FINDINGS ANTERIOR CIRCULATION: Examination mildly degraded by motion artifact. Visualized distal cervical segments of the internal carotid arteries are widely patent with antegrade flow. Petrous segments widely patent. Mild atheromatous irregularity throughout the carotid siphons without flow-limiting stenosis. Right A1 segment widely patent. Left A1 hypoplastic and/or absent, accounting for the slightly diminutive left ICA is compared to the right. Normal anterior communicating artery complex. Anterior cerebral arteries widely patent to their distal aspects without stenosis. No M1 stenosis or occlusion. Normal MCA bifurcations. Distal MCA branches well perfused and symmetric. POSTERIOR CIRCULATION: Vertebral arteries patent to the vertebrobasilar junction without stenosis. Right PICA patent. Left PICA not seen. Basilar somewhat diminutive and mildly tortuous but is widely patent to its distal aspect without stenosis. Superior cerebral arteries patent bilaterally. PCAs supplied via the basilar or as well as prominent and robust bilateral posterior communicating arteries. Both PCAs well perfused to their distal aspects without stenosis. No intracranial aneurysm. IMPRESSION: MRI HEAD IMPRESSION: 1. 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 2. Small remote cortical infarct involving the high anterior right frontal lobe, corresponding with abnormality seen on prior CT. 3. Few additional scattered small remote bilateral cerebellar infarcts. MRA HEAD IMPRESSION: Negative intracranial  MRA. No large vessel occlusion, hemodynamically significant stenosis, or other acute vascular abnormality. Electronically Signed   By: Jeannine Boga M.D.   On: 08/02/2019 02:14   MR BRAIN W WO CONTRAST  Result Date: 08/02/2019 CLINICAL DATA:  Initial evaluation for left-sided paresthesias, gait imbalance. History of lymphoma. EXAM: MRI HEAD WITHOUT AND WITH CONTRAST MRA HEAD WITHOUT CONTRAST TECHNIQUE: Multiplanar, multiecho pulse sequences of the brain and surrounding structures were obtained without and with intravenous contrast. Angiographic images of the head were obtained using MRA technique without contrast. CONTRAST:  33mL GADAVIST GADOBUTROL 1 MMOL/ML IV SOLN COMPARISON:  Prior CT from 08/01/2019. FINDINGS: MRI HEAD FINDINGS Brain: Cerebral volume within normal limits for age. Few scattered subcentimeter foci of T2/FLAIR hyperintensity noted involving the periventricular and deep white matter both cerebral hemispheres, nonspecific, but most like related to chronic small vessel ischemic disease. Overall, changes are minimal for age. Small remote cortical infarct noted at the high anterior right frontal lobe, corresponding with abnormality from prior CT (series 11, image 21). Few scattered small remote bilateral cerebellar infarcts noted as well. 5 mm focus of restricted diffusion seen involving the  right dorsal pons, consistent with a small acute ischemic infarct (series 5, image 66). No associated hemorrhage or mass effect. No other abnormal foci of diffusion abnormality to suggest acute or subacute ischemia. Gray-white matter differentiation otherwise maintained. No foci of susceptibility artifact to suggest acute or chronic intracranial hemorrhage. No mass lesion, midline shift or mass effect. No hydrocephalus or extra-axial fluid collection. Pituitary gland suprasellar region within normal limits. Midline structures intact. No abnormal enhancement following contrast administration. Incidental  note made of a left cerebellar DVA. Vascular: Major intracranial vascular flow voids are maintained. Skull and upper cervical spine: Craniocervical junction within normal limits. Bone marrow signal intensity normal. No focal marrow replacing lesion. No scalp soft tissue abnormality. Sinuses/Orbits: Globes and orbital soft tissues within normal limits. Paranasal sinuses are largely clear. Small left mastoid effusion noted, of doubtful significance. Other: None. MRA HEAD FINDINGS ANTERIOR CIRCULATION: Examination mildly degraded by motion artifact. Visualized distal cervical segments of the internal carotid arteries are widely patent with antegrade flow. Petrous segments widely patent. Mild atheromatous irregularity throughout the carotid siphons without flow-limiting stenosis. Right A1 segment widely patent. Left A1 hypoplastic and/or absent, accounting for the slightly diminutive left ICA is compared to the right. Normal anterior communicating artery complex. Anterior cerebral arteries widely patent to their distal aspects without stenosis. No M1 stenosis or occlusion. Normal MCA bifurcations. Distal MCA branches well perfused and symmetric. POSTERIOR CIRCULATION: Vertebral arteries patent to the vertebrobasilar junction without stenosis. Right PICA patent. Left PICA not seen. Basilar somewhat diminutive and mildly tortuous but is widely patent to its distal aspect without stenosis. Superior cerebral arteries patent bilaterally. PCAs supplied via the basilar or as well as prominent and robust bilateral posterior communicating arteries. Both PCAs well perfused to their distal aspects without stenosis. No intracranial aneurysm. IMPRESSION: MRI HEAD IMPRESSION: 1. 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 2. Small remote cortical infarct involving the high anterior right frontal lobe, corresponding with abnormality seen on prior CT. 3. Few additional scattered small remote bilateral cerebellar  infarcts. MRA HEAD IMPRESSION: Negative intracranial MRA. No large vessel occlusion, hemodynamically significant stenosis, or other acute vascular abnormality. Electronically Signed   By: Jeannine Boga M.D.   On: 08/02/2019 02:14   ECHOCARDIOGRAM COMPLETE  Result Date: 08/02/2019    ECHOCARDIOGRAM REPORT   Patient Name:   DAVONTAY WATLINGTON Date of Exam: 08/02/2019 Medical Rec #:  [id number]       Height:       69.5 in Accession #:    [id number]      Weight:       202.0 lb Date of Birth:  1951-01-10      BSA:          2.085 m Patient Age:    28 years        BP:           136/89 mmHg Patient Gender: M               HR:           106 bpm. Exam Location:  Inpatient Procedure: 2D Echo and Strain Analysis Indications:    TIA 435.9 / G45.9  History:        Patient has prior history of Echocardiogram examinations, most                 recent 01/25/2015. Risk Factors:Hypertension and Dyslipidemia.  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 Left carotid artery                 stenosis 60-79%.  Sonographer:    Darlina Sicilian RDCS Referring Phys: [id number] Jacobus  1. Left ventricular ejection fraction, by estimation, is 35 to 40%. The left ventricle has moderately decreased function. The left ventricle demonstrates global hypokinesis. The left ventricular internal cavity size was borderline dilated. Left ventricular diastolic parameters are consistent with Grade I diastolic dysfunction (impaired relaxation).  2. Right ventricular systolic function is normal. The right ventricular size is normal. Tricuspid regurgitation signal is inadequate for assessing PA pressure.  3. Left atrial size was moderately dilated.  4. The mitral valve is normal in structure. No evidence of mitral valve regurgitation.  5. The aortic valve is tricuspid. Aortic valve regurgitation is mild to moderate. Mild to moderate aortic valve sclerosis/calcification is present, without any evidence of aortic stenosis.   6. Aortic dilatation noted. There is borderline dilatation of the ascending aorta measuring 38 mm.  7. The inferior vena cava is normal in size with greater than 50% respiratory variability, suggesting right atrial pressure of 3 mmHg. Comparison(s): Prior images unable to be directly viewed, comparison made by report only. The left ventricular function is significantly worse. Very frequent PVCs are seen during the study.  Left ventricular ejection fraction, by estimation, is 35 to 40%. The left ventricle has moderately decreased function. The left ventricle demonstrates global hypokinesis. The left ventricular internal cavity size was borderline dilated. There is no left  ventricular hypertrophy. Left ventricular diastolic parameters are consistent with Grade I diastolic dysfunction (impaired relaxation). Normal left ventricular filling pressure. Right Ventricle: The right ventricular size is normal. No increase in right ventricular wall thickness. Right ventricular systolic function is normal. Tricuspid regurgitation signal is inadequate for assessing PA pressure. Left Atrium: Left atrial size was moderately dilated. Right Atrium: Right atrial size was normal in size. Pericardium: There is no evidence of pericardial effusion. Mitral Valve: The mitral valve is normal in structure. No evidence of mitral valve regurgitation. Tricuspid Valve: The tricuspid valve is normal in structure. Tricuspid valve regurgitation is not demonstrated. Aortic Valve: There is particularly dense calcification of the left coronary cusp. The aortic valve is tricuspid. Aortic valve regurgitation is mild to moderate. Mild to moderate aortic valve sclerosis/calcification is present, without any evidence of aortic stenosis. Aortic valve mean gradient measures 8.5 mmHg. Aortic valve peak gradient measures 20.2 mmHg. Aortic valve area, by VTI measures 2.25 cm. Pulmonic Valve: The pulmonic valve was grossly normal. Pulmonic valve regurgitation  is not visualized. Aorta: Aortic dilatation noted. There is borderline dilatation of the ascending aorta measuring 38 mm. Venous: The inferior vena cava is normal in size with greater than 50% respiratory variability, suggesting right atrial pressure of 3 mmHg. No atrial level shunt detected by color flow Doppler.  LEFT VENTRICLE PLAX 2D LVIDd:         5.50 cm  Diastology LVIDs:         4.40 cm  LV e' lateral:   7.29 cm/s LV PW:         0.90 cm  LV E/e' lateral: 6.8 LV IVS:        1.00 cm  LV e' medial:    4.46 cm/s LVOT diam:     2.40 cm  LV E/e' medial:  11.1 LV SV:         80 LV SV Index:   38 LVOT  Area:     4.52 cm  RIGHT VENTRICLE RV S prime:     14.70 cm/s RVOT diam:      1.90 cm TAPSE (M-mode): 3.0 cm LEFT ATRIUM             Index       RIGHT ATRIUM           Index LA diam:        4.50 cm 2.16 cm/m  RA Area:     16.70 cm LA Vol (A2C):   52.4 ml 25.13 ml/m RA Volume:   42.50 ml  20.38 ml/m LA Vol (A4C):   64.0 ml 30.69 ml/m LA Biplane Vol: 60.1 ml 28.82 ml/m  AORTIC VALVE                    PULMONIC VALVE AV Area (Vmax):    1.96 cm     RVOT Peak grad: 3 mmHg AV Area (Vmean):   2.04 cm AV Area (VTI):     2.25 cm AV Vmax:           225.00 cm/s AV Vmean:          136.000 cm/s AV VTI:            0.355 m AV Peak Grad:      20.2 mmHg AV Mean Grad:      8.5 mmHg LVOT Vmax:         97.50 cm/s LVOT Vmean:        61.200 cm/s LVOT VTI:          0.176 m LVOT/AV VTI ratio: 0.50  AORTA Ao Root diam: 3.60 cm Ao Asc diam:  3.80 cm MITRAL VALVE MV Area (PHT): 2.95 cm    SHUNTS MV Decel Time: 257 msec    Systemic VTI:  0.18 m MV E velocity: 49.70 cm/s  Systemic Diam: 2.40 cm MV A velocity: 61.70 cm/s  Pulmonic VTI:  0.163 m MV E/A ratio:  0.81        Pulmonic Diam: 1.90 cm                            Qp/Qs:         0.58 Mihai Croitoru MD Electronically signed by Sanda Klein MD Signature Date/Time: 08/02/2019/11:29:09 AM    Final    VAS US CAROTID (at [facility] and [facility] only)  Result Date: 08/02/2019 Carotid Arterial  Duplex Study Indications:       TIA and Numbness. Risk Factors:      Hypertension, hyperlipidemia. Other Factors:     Non-Hodgkin Lymphoma. Limitations        Today's exam was limited due to patient anatomy. Comparison Study:  No prior study Performing Technologist: Maudry Mayhew MHA, RDMS, RVT, RDCS  Examination Guidelines: A complete evaluation includes B-mode imaging, spectral Doppler, color Doppler, and power Doppler as needed of all accessible portions of each vessel. Bilateral testing is considered an integral part of a complete examination. Limited examinations for reoccurring indications may be performed as noted.  Right Carotid Findings: +----------+--------+--------+--------+-----------------------+--------+           PSV cm/sEDV cm/sStenosisPlaque Description     Comments +----------+--------+--------+--------+-----------------------+--------+ CCA Prox  1       0                                               +----------+--------+--------+--------+-----------------------+--------+  CCA Distal1       0               smooth and heterogenous         +----------+--------+--------+--------+-----------------------+--------+ ICA Prox  1       0               smooth and homogeneous          +----------+--------+--------+--------+-----------------------+--------+ ICA Distal1       0                                               +----------+--------+--------+--------+-----------------------+--------+ ECA       1       0               smooth and heterogenous         +----------+--------+--------+--------+-----------------------+--------+ +----------+--------+-------+----------------+-------------------+           PSV cm/sEDV cmsDescribe        Arm Pressure (mmHG) +----------+--------+-------+----------------+-------------------+ Jarrett Ables              Multiphasic, [id number]                 +----------+--------+-------+----------------+-------------------+  +---------+--------+-+--------+-+---------+ VertebralPSV cm/s0EDV cm/s0Antegrade +---------+--------+-+--------+-+---------+  Left Carotid Findings: +----------+--------+-------+--------+--------------------------------+--------+           PSV cm/sEDV    StenosisPlaque Description              Comments                   cm/s                                                    +----------+--------+-------+--------+--------------------------------+--------+ CCA Prox  1       0                                                       +----------+--------+-------+--------+--------------------------------+--------+ CCA Distal1       0              smooth, heterogenous and                                                  calcific                                 +----------+--------+-------+--------+--------------------------------+--------+ ICA Prox  3       1      60-79%  smooth, heterogenous and                                                  calcific                                 +----------+--------+-------+--------+--------------------------------+--------+  ICA Distal1       0                                                       +----------+--------+-------+--------+--------------------------------+--------+ ECA       1       0                                                       +----------+--------+-------+--------+--------------------------------+--------+ +----------+--------+--------+------------+-------------------+           PSV cm/sEDV cm/sDescribe    Arm Pressure (mmHG) +----------+--------+--------+------------+-------------------+ Subclavian                Not assessed134                 +----------+--------+--------+------------+-------------------+ +---------+--------+--+--------+--+----------+ VertebralPSV cm/s-1EDV cm/s-0Retrograde +---------+--------+--+--------+--+----------+   Summary: Right Carotid: Velocities  in the right ICA are consistent with a 1-39% stenosis. Left Carotid: Velocities in the left ICA are consistent with a 60-79% stenosis. Vertebrals:  Right vertebral artery demonstrates antegrade flow. Left vertebral              artery demonstrates retrograde flow. Subclavians: Left subclavian artery was not visualized. Normal flow hemodynamics              were seen in the right subclavian artery. *See table(s) above for measurements and observations.  Electronically signed by Antony Contras MD on 08/02/2019 at 8:47:01 AM.    Final     PHYSICAL EXAM  Temp:  [97.7 F (36.5 C)-98.6 F (37 C)] 98.6 F (37 C) (06/29 1030) Pulse Rate:  [29-106] 106 (06/29 1030) Resp:  [10-22] 17 (06/29 1030) BP: (93-148)/(62-104) 136/89 (06/29 1030) SpO2:  [92 %-100 %] 95 % (06/29 1030)  General - Well nourished, well developed, in no apparent distress.  Ophthalmologic - fundi not visualized due to noncooperation.  Cardiovascular - Regular rhythm and rate.  Mental Status -  Level of arousal and orientation to time, place, and person were intact. Language including expression, naming, repetition, comprehension was assessed and found intact. Fund of Knowledge was assessed and was intact.  Cranial Nerves II - XII - II - Visual field intact OU. III, IV, VI - Extraocular movements intact. V - Facial sensation intact bilaterally. VII - Facial movement intact bilaterally. VIII - Hearing & vestibular intact bilaterally. X - Palate elevates symmetrically. XI - Chin turning & shoulder shrug intact bilaterally. XII - Tongue protrusion intact.  Motor Strength - The patient's strength was normal in all extremities and pronator drift was absent.  Bulk was normal and fasciculations were absent.   Motor Tone - Muscle tone was assessed at the neck and appendages and was normal.  Reflexes - The patient's reflexes were symmetrical in all extremities and he had no pathological reflexes.  Sensory - Light touch,  temperature/pinprick were assessed and were symmetrical.    Coordination - The patient had normal movements in the hands and feet with no ataxia or dysmetria.  Tremor was absent.  Gait and Station - deferred.   ASSESSMENT/PLAN [name] is a 69 y.o. male with history of HTN, HLD, lymphoma on chemo presenting with left sided  numbness and imbalance.   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 Chronic superior L cerebellar infarct. Small vessel disease. Atrophy. ASPECTS 10.     MRI  R dorsal pontine infarct. Old R frontal lobe infarct. Few scattered old B cerebellar infarcts.  MRA  Unremarkable   Carotid Doppler  L ICA 60-79% (incidental). L VA retrograde flow.  2D Echo EF 35-40%. LA moderately dilated. No source of embolus   LDL 155  HgbA1c 6.1  UDS neg  SCDs for VTE prophylaxis  No antithrombotic prior to admission, now on aspirin 81 mg daily and clopidogrel 75 mg daily. Continue DAPT x 3 weeks then aspirin alone  Therapy recommendations:  none  Disposition:  home  Cardiomyopathy   EF 35-40% this admission  01/2015 EF 55-60%  Recommend cardiology inpt consult or outpt referral for evaluation of ischemic cardiomyopathy  On DAPT and statin  Carotid stenosis  CUS left ICA 60-79% stenosis  Asymptomatic at this time  Recommend vascular surgery referral as outpt for follow up and monitoring  Hypertension  Home meds:  Lisinopril and HCTZ  Stable . Permissive hypertension (OK if < 220/120) but gradually normalize in 2-3 days . Long-term BP goal normotensive  Hyperlipidemia  Home meds:  mevacor 20   Now on lipitor 80   LDL 155, goal < 70  Continue statin at discharge  Other Stroke Risk Factors  Advanced age  Hx stroke on imaging  Other Active Problems  Lymphoma on chemo  Hospital day # 0  Neurology will sign off. Please call with questions. Pt  will follow up with stroke clinic [facility] at [facility] in about 4 weeks. Thanks for the consult.  Rosalin Hawking, MD PhD Stroke Neurology 08/02/2019 1:47 PM   To contact Stroke Continuity provider, please refer to [email or web address] After hours, contact General Neurology

## 2019-08-02 NOTE — Plan of Care (Signed)
Problem: Education: Goal: Knowledge of General Education information will improve Description: Including pain rating scale, medication(s)/side effects and non-pharmacologic comfort measures 08/02/2019 0035 by Florestine Avers, RN Outcome: Progressing 08/02/2019 0035 by Florestine Avers, RN Outcome: Progressing   Problem: Health Behavior/Discharge Planning: Goal: Ability to manage health-related needs will improve 08/02/2019 0035 by Florestine Avers, RN Outcome: Progressing 08/02/2019 0035 by Florestine Avers, RN Outcome: Progressing   Problem: Clinical Measurements: Goal: Ability to maintain clinical measurements within normal limits will improve 08/02/2019 0035 by Florestine Avers, RN Outcome: Progressing 08/02/2019 0035 by Florestine Avers, RN Outcome: Progressing Goal: Will remain free from infection 08/02/2019 0035 by Florestine Avers, RN Outcome: Progressing 08/02/2019 0035 by Florestine Avers, RN Outcome: Progressing Goal: Diagnostic test results will improve 08/02/2019 0035 by Florestine Avers, RN Outcome: Progressing 08/02/2019 0035 by Florestine Avers, RN Outcome: Progressing Goal: Respiratory complications will improve 08/02/2019 0035 by Florestine Avers, RN Outcome: Progressing 08/02/2019 0035 by Florestine Avers, RN Outcome: Progressing Goal: Cardiovascular complication will be avoided 08/02/2019 0035 by Florestine Avers, RN Outcome: Progressing 08/02/2019 0035 by Florestine Avers, RN Outcome: Progressing   Problem: Activity: Goal: Risk for activity intolerance will decrease 08/02/2019 0035 by Florestine Avers, RN Outcome: Progressing 08/02/2019 0035 by Florestine Avers, RN Outcome: Progressing   Problem: Nutrition: Goal: Adequate nutrition will be maintained 08/02/2019 0035 by Florestine Avers, RN Outcome: Progressing 08/02/2019 0035 by Florestine Avers, RN Outcome: Progressing   Problem: Coping: Goal: Level  of anxiety will decrease 08/02/2019 0035 by Florestine Avers, RN Outcome: Progressing 08/02/2019 0035 by Florestine Avers, RN Outcome: Progressing   Problem: Elimination: Goal: Will not experience complications related to bowel motility 08/02/2019 0035 by Florestine Avers, RN Outcome: Progressing 08/02/2019 0035 by Florestine Avers, RN Outcome: Progressing Goal: Will not experience complications related to urinary retention 08/02/2019 0035 by Florestine Avers, RN Outcome: Progressing 08/02/2019 0035 by Florestine Avers, RN Outcome: Progressing   Problem: Pain Managment: Goal: General experience of comfort will improve 08/02/2019 0035 by Florestine Avers, RN Outcome: Progressing 08/02/2019 0035 by Florestine Avers, RN Outcome: Progressing   Problem: Safety: Goal: Ability to remain free from injury will improve 08/02/2019 0035 by Florestine Avers, RN Outcome: Progressing 08/02/2019 0035 by Florestine Avers, RN Outcome: Progressing   Problem: Skin Integrity: Goal: Risk for impaired skin integrity will decrease 08/02/2019 0035 by Florestine Avers, RN Outcome: Progressing 08/02/2019 0035 by Florestine Avers, RN Outcome: Progressing   Problem: Education: Goal: Knowledge of disease or condition will improve 08/02/2019 0035 by Florestine Avers, RN Outcome: Progressing 08/02/2019 0035 by Florestine Avers, RN Outcome: Progressing Goal: Knowledge of secondary prevention will improve 08/02/2019 0035 by Florestine Avers, RN Outcome: Progressing 08/02/2019 0035 by Florestine Avers, RN Outcome: Progressing Goal: Knowledge of patient specific risk factors addressed and post discharge goals established will improve 08/02/2019 0035 by Florestine Avers, RN Outcome: Progressing 08/02/2019 0035 by Florestine Avers, RN Outcome: Progressing Goal: Individualized Educational Video(s) 08/02/2019 0035 by Florestine Avers, RN Outcome:  Progressing 08/02/2019 0035 by Florestine Avers, RN Outcome: Progressing   Problem: Coping: Goal: Will verbalize positive feelings about self 08/02/2019 0035 by Florestine Avers, RN Outcome: Progressing 08/02/2019 0035 by Florestine Avers, RN Outcome: Progressing Goal: Will identify appropriate support needs 08/02/2019 0035 by Florestine Avers, RN Outcome: Progressing 08/02/2019 0035 by Florestine Avers, RN Outcome: Progressing  Problem: Health Behavior/Discharge Planning: Goal: Ability to manage health-related needs will improve 08/02/2019 0035 by Florestine Avers, RN Outcome: Progressing 08/02/2019 0035 by Florestine Avers, RN Outcome: Progressing   Problem: Self-Care: Goal: Ability to participate in self-care as condition permits will improve 08/02/2019 0035 by Florestine Avers, RN Outcome: Progressing 08/02/2019 0035 by Florestine Avers, RN Outcome: Progressing Goal: Verbalization of feelings and concerns over difficulty with self-care will improve 08/02/2019 0035 by Florestine Avers, RN Outcome: Progressing 08/02/2019 0035 by Florestine Avers, RN Outcome: Progressing Goal: Ability to communicate needs accurately will improve 08/02/2019 0035 by Florestine Avers, RN Outcome: Progressing 08/02/2019 0035 by Florestine Avers, RN Outcome: Progressing   Problem: Nutrition: Goal: Risk of aspiration will decrease 08/02/2019 0035 by Florestine Avers, RN Outcome: Progressing 08/02/2019 0035 by Florestine Avers, RN Outcome: Progressing Goal: Dietary intake will improve 08/02/2019 0035 by Florestine Avers, RN Outcome: Progressing 08/02/2019 0035 by Florestine Avers, RN Outcome: Progressing   Problem: Ischemic Stroke/TIA Tissue Perfusion: Goal: Complications of ischemic stroke/TIA will be minimized 08/02/2019 0035 by Florestine Avers, RN Outcome: Progressing 08/02/2019 0035 by Florestine Avers, RN Outcome: Progressing

## 2019-08-03 ENCOUNTER — Encounter (HOSPITAL_COMMUNITY): Payer: Self-pay | Admitting: Internal Medicine

## 2019-08-03 ENCOUNTER — Other Ambulatory Visit: Payer: Self-pay | Admitting: Oncology

## 2019-08-03 DIAGNOSIS — K219 Gastro-esophageal reflux disease without esophagitis: Secondary | ICD-10-CM | POA: Diagnosis not present

## 2019-08-03 DIAGNOSIS — Z79899 Other long term (current) drug therapy: Secondary | ICD-10-CM | POA: Diagnosis not present

## 2019-08-03 DIAGNOSIS — I10 Essential (primary) hypertension: Secondary | ICD-10-CM | POA: Diagnosis not present

## 2019-08-03 DIAGNOSIS — I6522 Occlusion and stenosis of left carotid artery: Secondary | ICD-10-CM

## 2019-08-03 DIAGNOSIS — I5021 Acute systolic (congestive) heart failure: Secondary | ICD-10-CM

## 2019-08-03 DIAGNOSIS — C8319 Mantle cell lymphoma, extranodal and solid organ sites: Secondary | ICD-10-CM | POA: Diagnosis not present

## 2019-08-03 DIAGNOSIS — I428 Other cardiomyopathies: Secondary | ICD-10-CM | POA: Diagnosis not present

## 2019-08-03 DIAGNOSIS — E785 Hyperlipidemia, unspecified: Secondary | ICD-10-CM | POA: Diagnosis not present

## 2019-08-03 DIAGNOSIS — E782 Mixed hyperlipidemia: Secondary | ICD-10-CM

## 2019-08-03 DIAGNOSIS — R200 Anesthesia of skin: Secondary | ICD-10-CM | POA: Diagnosis not present

## 2019-08-03 DIAGNOSIS — C8590 Non-Hodgkin lymphoma, unspecified, unspecified site: Secondary | ICD-10-CM | POA: Diagnosis not present

## 2019-08-03 DIAGNOSIS — I6782 Cerebral ischemia: Secondary | ICD-10-CM | POA: Diagnosis not present

## 2019-08-03 DIAGNOSIS — G459 Transient cerebral ischemic attack, unspecified: Secondary | ICD-10-CM | POA: Diagnosis not present

## 2019-08-03 DIAGNOSIS — Z20822 Contact with and (suspected) exposure to covid-19: Secondary | ICD-10-CM | POA: Diagnosis not present

## 2019-08-03 LAB — COMPREHENSIVE METABOLIC PANEL
ALT: 16 U/L (ref 0–44)
AST: 17 U/L (ref 15–41)
Albumin: 3.8 g/dL (ref 3.5–5.0)
Alkaline Phosphatase: 31 U/L — ABNORMAL LOW (ref 38–126)
Anion gap: 11 (ref 5–15)
BUN: 21 mg/dL (ref 8–23)
CO2: 25 mmol/L (ref 22–32)
Calcium: 9.2 mg/dL (ref 8.9–10.3)
Chloride: 101 mmol/L (ref 98–111)
Creatinine, Ser: 1.26 mg/dL — ABNORMAL HIGH (ref 0.61–1.24)
GFR calc Af Amer: >60 mL/min (ref >60)
GFR calc non Af Amer: 59 mL/min — ABNORMAL LOW (ref >60)
Glucose, Bld: 99 mg/dL (ref 70–99)
Potassium: 4 mmol/L (ref 3.5–5.1)
Sodium: 137 mmol/L (ref 135–145)
Total Bilirubin: 1.2 mg/dL (ref 0.3–1.2)
Total Protein: 6.4 g/dL — ABNORMAL LOW (ref 6.5–8.1)

## 2019-08-03 LAB — CBC
HCT: 40.7 % (ref 39.0–52.0)
Hemoglobin: 13.1 g/dL (ref 13.0–17.0)
MCH: 27.5 pg (ref 26.0–34.0)
MCHC: 32.2 g/dL (ref 30.0–36.0)
MCV: 85.3 fL (ref 80.0–100.0)
Platelets: 280 10*3/uL (ref 150–400)
RBC: 4.77 MIL/uL (ref 4.22–5.81)
RDW: 13.4 % (ref 11.5–15.5)
WBC: 4.4 10*3/uL (ref 4.0–10.5)
nRBC: 0 % (ref 0.0–0.2)

## 2019-08-03 LAB — PHOSPHORUS: Phosphorus: 4.1 mg/dL (ref 2.5–4.6)

## 2019-08-03 LAB — HEPARIN LEVEL (UNFRACTIONATED)
Heparin Unfractionated: 0.51 IU/mL (ref 0.30–0.70)
Heparin Unfractionated: 0.53 IU/mL (ref 0.30–0.70)

## 2019-08-03 LAB — MAGNESIUM: Magnesium: 2.2 mg/dL (ref 1.7–2.4)

## 2019-08-03 LAB — TSH: TSH: 1.924 u[IU]/mL (ref 0.350–4.500)

## 2019-08-03 MED ORDER — ASPIRIN 81 MG PO TBEC: 81.0000 mg | DELAYED_RELEASE_TABLET | Freq: Every day | ORAL | 11 refills | Status: DC

## 2019-08-03 MED ORDER — CLOPIDOGREL BISULFATE 75 MG PO TABS: 75.0000 mg | ORAL_TABLET | Freq: Every day | ORAL | 0 refills | Status: DC

## 2019-08-03 MED ORDER — ATORVASTATIN CALCIUM 80 MG PO TABS: 80.0000 mg | ORAL_TABLET | Freq: Every day | ORAL | 0 refills | Status: DC

## 2019-08-03 MED ORDER — ENTRESTO 24-26 MG PO TABS: 1.0000 | ORAL_TABLET | Freq: Two times a day (BID) | ORAL | 0 refills | Status: DC

## 2019-08-03 MED ORDER — CLOPIDOGREL BISULFATE 75 MG PO TABS: 75.0000 mg | ORAL_TABLET | Freq: Every day | ORAL | 0 refills | Status: AC

## 2019-08-03 MED FILL — CLOPIDOGREL 75 MG TABLET: 75 | 21 days supply | Qty: 21 | Fill #0

## 2019-08-03 MED FILL — ENTRESTO 24 MG-26 MG TABLET: 24-26 | 30 days supply | Qty: 60 | Fill #0

## 2019-08-03 MED FILL — ATORVASTATIN CALCIUM 80 MG: 80 | 30 days supply | Qty: 30 | Fill #0

## 2019-08-03 MED FILL — ASPIRIN LOW DOSE 81 MG TBEC: 81 | 30 days supply | Qty: 30 | Fill #0

## 2019-08-03 NOTE — Discharge Summary (Signed)
Physician Discharge Summary  [name] AJO:[id number] DOB: April 20, 1950 DOA: 08/01/2019  PCP: Shelda Pal, DO  Admit date: 08/01/2019 Discharge date: 08/03/2019  Admitted From: Home Disposition:  Home  Recommendations for Outpatient Follow-up:  1. Follow up with PCP in 2-3 weeks 2. Follow up with Neurology as scheduled 3. Follow up with Cardiology as scheduled  Discharge Condition:Stable CODE STATUS:DNR Diet recommendation: Heart healthy   Brief/Interim Summary: sudden episode of left-sided numbness and tingling. Patient states he was in his usual state of health. He often lifts weights and does 150 push-ups every day. Shortly after finishing his exercise regimen while watering his lawn around 8 AM he noted feeling "off" with increased numbness on the left side of his face as well as a similar numbness and tingling in his left wrist and shoulder. He stated he felt like he was off balance but that it would go away on its own. He was able to drive his son to work when the feeling had not gone away after an hour and a half him and his wife drove to the emergency department. He also reports a sensation of left ear fullness/increased pressure. He is followed by Dr. Jana Hakim receiving oral chemotherapy for non-Hodgkin's lymphoma with no complications.  Concerning his hypertension he states he is adherent with his blood pressure regimen regimen  Concerning his hyperlipidemia he also reports adherence with his statin therapy.  ED course: Afebrile heart rate range 75-80, blood pressure 145/75. SARS PCR negative. Creatinine 1.3 (consistent with baseline). Otherwise labs unremarkable.  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  Given concern for TIA versus stroke teleneurology was consulted who recommended initiation of aspirin 81 mg and activate stroke  protocol admission/order set. 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 Few scattered old B cerebellar infarcts. -MRA was Unremarkable -Carotid doppler with L ICA 60-79% (incidental). L VA retrograde flow. -2d echo notable forEF35-40%.LA moderately dilated.No source of embolus -LDL=155  -HgbA1c=6.1 consistent with prediabetes -Urine drug screen; negative  -No antithromboticprior to admission. Per Neurology, reocommendation for ASA with plavix 75mg  daily x 3 weeks then ASA alone -Pt to follow up with stroke clinic NP at [facility] in about 4 weeks  Paroxysmal atrial fibrillation -Rate controlled -TSH of 1.92 -Initially continued on Heparin per pharmacy -Seen by and discussed with cardiology. Recommendation for continuation with antiplatelet as per above and hold therapeutic anticoagulation  Acute Systolic and Diastolic CHF/Cardiomyopathy -EF 35 to 40% noted -Discussed with Cardiology. Recommendation for transition to entresto, to be titrated as outpatient, and to start 36hrs after lisinopril "washout"  Carotid stenosis -CUS left ICA 60-79% stenosis -Asymptomatic at this time -Recommend vascular surgery referral as outpt for follow up and monitoring  Hypertension -Stable -Per above, plan to start entresto as outpatient, to be followed by Cardiology  Hyperlipidemia -Home meds:mevacor 20 -Now on lipitor 80 -LDL 155, goal < 70 -Continue statin at discharge  Other Stroke Risk Factors  Advanced age  Hx strokeon imaging  Other Active Problems  Lymphoma on  chemo  Discharge Instructions  Discharge Instructions    Ambulatory referral to Neurology   Complete by: As directed    Follow up with stroke clinic NP (Jessica Vanschaick or Cecille Rubin, if both not available, consider Zachery Dauer, or Ahern) at [facility] in about 4 weeks. Thanks.     Allergies as of 08/03/2019   No Known Allergies     Medication List    STOP taking these medications   lisinopril-hydrochlorothiazide 20-25 MG tablet Commonly known as: ZESTORETIC   lovastatin 20 MG tablet Commonly known as: MEVACOR     TAKE these medications   aspirin 81 MG EC tablet Take 1 tablet (81 mg total) by mouth daily. Swallow whole. Start taking on: August 04, 2019   atorvastatin 80 MG tablet Commonly known as: LIPITOR Take 1 tablet (80 mg total) by mouth daily.   clopidogrel 75 MG tablet Commonly known as: PLAVIX Take 1 tablet (75 mg total) by mouth daily for 21 days. Start taking on: August 04, 2019   diazepam 5 MG tablet Commonly known as: VALIUM Take 1 tablet 1 hour prior to scan and repeat if needed   Entresto 24-26 MG Generic drug: sacubitril-valsartan Take 1 tablet by mouth 2 (two) times daily. Start taking on: August 05, 2019   Imbruvica 420 MG Tabs Generic drug: Ibrutinib TAKE 1 TABLET BY MOUTH DAILY.       Follow-up Information    [facility]. Schedule an appointment as soon as possible for a visit in 4 week(s).   Specialty: Neurology Contact information: [address] [place] Leonidas Botines, [place], DO. Schedule an appointment as soon as possible for a visit in 2 week(s).   Specialty: Family Medicine Contact information: [address] [place] [address] [phone]        Pixie Casino, MD .   Specialty: Cardiology Contact information: [address] [place] [address] [phone]              No Known  Allergies  Consultations:  Neurology  Cardiology  Procedures/Studies: MR ANGIO HEAD WO CONTRAST  Result Date: 08/02/2019 CLINICAL DATA:  Initial evaluation for left-sided paresthesias, gait imbalance. History of lymphoma. EXAM: MRI HEAD WITHOUT AND WITH CONTRAST MRA HEAD WITHOUT CONTRAST TECHNIQUE: Multiplanar, multiecho pulse sequences of the brain and surrounding structures were obtained without and with intravenous contrast. Angiographic images of the head were obtained using MRA technique without contrast. CONTRAST:  33mL GADAVIST GADOBUTROL 1 MMOL/ML IV SOLN COMPARISON:  Prior CT from 08/01/2019. FINDINGS: MRI HEAD FINDINGS Brain: Cerebral volume within normal limits for age. Few scattered subcentimeter foci of T2/FLAIR hyperintensity noted involving the periventricular and deep white matter both cerebral hemispheres, nonspecific, but most like related to chronic small vessel ischemic disease. Overall, changes are minimal for age. Small remote cortical infarct noted at the high anterior right frontal lobe, corresponding with abnormality from prior CT (series 11, image 21). Few scattered small remote bilateral cerebellar infarcts noted as well. 5 mm focus of restricted diffusion seen involving the right dorsal pons, consistent with a small acute ischemic infarct (series 5, image 66). No associated hemorrhage or mass effect. No other abnormal foci of diffusion abnormality to suggest acute or subacute ischemia. Gray-white matter differentiation otherwise maintained. No foci of susceptibility artifact to suggest acute or chronic intracranial hemorrhage. No mass lesion, midline shift or mass effect. No hydrocephalus or extra-axial fluid collection. Pituitary gland  suprasellar region within normal limits. Midline structures intact. No abnormal enhancement following contrast administration. Incidental note made of a left cerebellar DVA. Vascular: Major intracranial vascular flow voids are maintained. Skull  and upper cervical spine: Craniocervical junction within normal limits. Bone marrow signal intensity normal. No focal marrow replacing lesion. No scalp soft tissue abnormality. Sinuses/Orbits: Globes and orbital soft tissues within normal limits. Paranasal sinuses are largely clear. Small left mastoid effusion noted, of doubtful significance. Other: None. MRA HEAD FINDINGS ANTERIOR CIRCULATION: Examination mildly degraded by motion artifact. Visualized distal cervical segments of the internal carotid arteries are widely patent with antegrade flow. Petrous segments widely patent. Mild atheromatous irregularity throughout the carotid siphons without flow-limiting stenosis. Right A1 segment widely patent. Left A1 hypoplastic and/or absent, accounting for the slightly diminutive left ICA is compared to the right. Normal anterior communicating artery complex. Anterior cerebral arteries widely patent to their distal aspects without stenosis. No M1 stenosis or occlusion. Normal MCA bifurcations. Distal MCA branches well perfused and symmetric. POSTERIOR CIRCULATION: Vertebral arteries patent to the vertebrobasilar junction without stenosis. Right PICA patent. Left PICA not seen. Basilar somewhat diminutive and mildly tortuous but is widely patent to its distal aspect without stenosis. Superior cerebral arteries patent bilaterally. PCAs supplied via the basilar or as well as prominent and robust bilateral posterior communicating arteries. Both PCAs well perfused to their distal aspects without stenosis. No intracranial aneurysm. IMPRESSION: MRI HEAD IMPRESSION: 1. 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 2. Small remote cortical infarct involving the high anterior right frontal lobe, corresponding with abnormality seen on prior CT. 3. Few additional scattered small remote bilateral cerebellar infarcts. MRA HEAD IMPRESSION: Negative intracranial MRA. No large vessel occlusion, hemodynamically  significant stenosis, or other acute vascular abnormality. Electronically Signed   By: Jeannine Boga M.D.   On: 08/02/2019 02:14   MR BRAIN W WO CONTRAST  Result Date: 08/02/2019 CLINICAL DATA:  Initial evaluation for left-sided paresthesias, gait imbalance. History of lymphoma. EXAM: MRI HEAD WITHOUT AND WITH CONTRAST MRA HEAD WITHOUT CONTRAST TECHNIQUE: Multiplanar, multiecho pulse sequences of the brain and surrounding structures were obtained without and with intravenous contrast. Angiographic images of the head were obtained using MRA technique without contrast. CONTRAST:  9mL GADAVIST GADOBUTROL 1 MMOL/ML IV SOLN COMPARISON:  Prior CT from 08/01/2019. FINDINGS: MRI HEAD FINDINGS Brain: Cerebral volume within normal limits for age. Few scattered subcentimeter foci of T2/FLAIR hyperintensity noted involving the periventricular and deep white matter both cerebral hemispheres, nonspecific, but most like related to chronic small vessel ischemic disease. Overall, changes are minimal for age. Small remote cortical infarct noted at the high anterior right frontal lobe, corresponding with abnormality from prior CT (series 11, image 21). Few scattered small remote bilateral cerebellar infarcts noted as well. 5 mm focus of restricted diffusion seen involving the right dorsal pons, consistent with a small acute ischemic infarct (series 5, image 66). No associated hemorrhage or mass effect. No other abnormal foci of diffusion abnormality to suggest acute or subacute ischemia. Gray-white matter differentiation otherwise maintained. No foci of susceptibility artifact to suggest acute or chronic intracranial hemorrhage. No mass lesion, midline shift or mass effect. No hydrocephalus or extra-axial fluid collection. Pituitary gland suprasellar region within normal limits. Midline structures intact. No abnormal enhancement following contrast administration. Incidental note made of a left cerebellar DVA. Vascular:  Major intracranial vascular flow voids are maintained. Skull and upper cervical spine: Craniocervical junction within normal limits. Bone marrow signal intensity normal. No focal marrow replacing  lesion. No scalp soft tissue abnormality. Sinuses/Orbits: Globes and orbital soft tissues within normal limits. Paranasal sinuses are largely clear. Small left mastoid effusion noted, of doubtful significance. Other: None. MRA HEAD FINDINGS ANTERIOR CIRCULATION: Examination mildly degraded by motion artifact. Visualized distal cervical segments of the internal carotid arteries are widely patent with antegrade flow. Petrous segments widely patent. Mild atheromatous irregularity throughout the carotid siphons without flow-limiting stenosis. Right A1 segment widely patent. Left A1 hypoplastic and/or absent, accounting for the slightly diminutive left ICA is compared to the right. Normal anterior communicating artery complex. Anterior cerebral arteries widely patent to their distal aspects without stenosis. No M1 stenosis or occlusion. Normal MCA bifurcations. Distal MCA branches well perfused and symmetric. POSTERIOR CIRCULATION: Vertebral arteries patent to the vertebrobasilar junction without stenosis. Right PICA patent. Left PICA not seen. Basilar somewhat diminutive and mildly tortuous but is widely patent to its distal aspect without stenosis. Superior cerebral arteries patent bilaterally. PCAs supplied via the basilar or as well as prominent and robust bilateral posterior communicating arteries. Both PCAs well perfused to their distal aspects without stenosis. No intracranial aneurysm. IMPRESSION: MRI HEAD IMPRESSION: 1. 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 2. Small remote cortical infarct involving the high anterior right frontal lobe, corresponding with abnormality seen on prior CT. 3. Few additional scattered small remote bilateral cerebellar infarcts. MRA HEAD IMPRESSION: Negative  intracranial MRA. No large vessel occlusion, hemodynamically significant stenosis, or other acute vascular abnormality. Electronically Signed   By: Jeannine Boga M.D.   On: 08/02/2019 02:14   ECHOCARDIOGRAM COMPLETE  Result Date: 08/02/2019    ECHOCARDIOGRAM REPORT   Patient Name:   [name] Date of Exam: 08/02/2019 Medical Rec #:  [id number]       Height:       69.5 in Accession #:    [id number]      Weight:       202.0 lb Date of Birth:  1950-06-17      BSA:          2.085 m Patient Age:    69 years        BP:           136/89 mmHg Patient Gender: M               HR:           106 bpm. Exam Location:  Inpatient Procedure: 2D Echo and Strain Analysis Indications:    TIA 435.9 / G45.9  History:        Patient has prior history of Echocardiogram examinations, most                 recent 01/25/2015. Risk Factors:Hypertension and Dyslipidemia.                 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 Left carotid artery                 stenosis 60-79%.  Sonographer:    Darlina Sicilian RDCS Referring Phys: [id number] [place]  1. Left ventricular ejection fraction, by estimation, is 35 to 40%. The left ventricle has moderately decreased function. The left ventricle demonstrates global hypokinesis. The left ventricular internal cavity size was borderline dilated. Left ventricular diastolic parameters are consistent with Grade I diastolic dysfunction (impaired relaxation).  2. Right ventricular systolic function is normal. The right ventricular size is normal. Tricuspid regurgitation signal is inadequate for assessing PA pressure.  3. Left atrial size  was moderately dilated.  4. The mitral valve is normal in structure. No evidence of mitral valve regurgitation.  5. The aortic valve is tricuspid. Aortic valve regurgitation is mild to moderate. Mild to moderate aortic valve sclerosis/calcification is present, without any evidence of aortic stenosis.  6. Aortic dilatation noted. There is  borderline dilatation of the ascending aorta measuring 38 mm.  7. The inferior vena cava is normal in size with greater than 50% respiratory variability, suggesting right atrial pressure of 3 mmHg. Comparison(s): Prior images unable to be directly viewed, comparison made by report only. The left ventricular function is significantly worse. Very frequent PVCs are seen during the study.  Left ventricular ejection fraction, by estimation, is 35 to 40%. The left ventricle has moderately decreased function. The left ventricle demonstrates global hypokinesis. The left ventricular internal cavity size was borderline dilated. There is no left  ventricular hypertrophy. Left ventricular diastolic parameters are consistent with Grade I diastolic dysfunction (impaired relaxation). Normal left ventricular filling pressure. Right Ventricle: The right ventricular size is normal. No increase in right ventricular wall thickness. Right ventricular systolic function is normal. Tricuspid regurgitation signal is inadequate for assessing PA pressure. Left Atrium: Left atrial size was moderately dilated. Right Atrium: Right atrial size was normal in size. Pericardium: There is no evidence of pericardial effusion. Mitral Valve: The mitral valve is normal in structure. No evidence of mitral valve regurgitation. Tricuspid Valve: The tricuspid valve is normal in structure. Tricuspid valve regurgitation is not demonstrated. Aortic Valve: There is particularly dense calcification of the left coronary cusp. The aortic valve is tricuspid. Aortic valve regurgitation is mild to moderate. Mild to moderate aortic valve sclerosis/calcification is present, without any evidence of aortic stenosis. Aortic valve mean gradient measures 8.5 mmHg. Aortic valve peak gradient measures 20.2 mmHg. Aortic valve area, by VTI measures 2.25 cm. Pulmonic Valve: The pulmonic valve was grossly normal. Pulmonic valve regurgitation is not visualized. Aorta: Aortic  dilatation noted. There is borderline dilatation of the ascending aorta measuring 38 mm. Venous: The inferior vena cava is normal in size with greater than 50% respiratory variability, suggesting right atrial pressure of 3 mmHg. No atrial level shunt detected by color flow Doppler.  LEFT VENTRICLE PLAX 2D LVIDd:         5.50 cm  Diastology LVIDs:         4.40 cm  LV e' lateral:   7.29 cm/s LV PW:         0.90 cm  LV E/e' lateral: 6.8 LV IVS:        1.00 cm  LV e' medial:    4.46 cm/s LVOT diam:     2.40 cm  LV E/e' medial:  11.1 LV SV:         80 LV SV Index:   38 LVOT Area:     4.52 cm  RIGHT VENTRICLE RV S prime:     14.70 cm/s RVOT diam:      1.90 cm TAPSE (M-mode): 3.0 cm LEFT ATRIUM             Index       RIGHT ATRIUM           Index LA diam:        4.50 cm 2.16 cm/m  RA Area:     16.70 cm LA Vol (A2C):   52.4 ml 25.13 ml/m RA Volume:   42.50 ml  20.38 ml/m LA Vol (A4C):   64.0 ml 30.69 ml/m LA  Biplane Vol: 60.1 ml 28.82 ml/m  AORTIC VALVE                    PULMONIC VALVE AV Area (Vmax):    1.96 cm     RVOT Peak grad: 3 mmHg AV Area (Vmean):   2.04 cm AV Area (VTI):     2.25 cm AV Vmax:           225.00 cm/s AV Vmean:          136.000 cm/s AV VTI:            0.355 m AV Peak Grad:      20.2 mmHg AV Mean Grad:      8.5 mmHg LVOT Vmax:         97.50 cm/s LVOT Vmean:        61.200 cm/s LVOT VTI:          0.176 m LVOT/AV VTI ratio: 0.50  AORTA Ao Root diam: 3.60 cm Ao Asc diam:  3.80 cm MITRAL VALVE MV Area (PHT): 2.95 cm    SHUNTS MV Decel Time: 257 msec    Systemic VTI:  0.18 m MV E velocity: 49.70 cm/s  Systemic Diam: 2.40 cm MV A velocity: 61.70 cm/s  Pulmonic VTI:  0.163 m MV E/A ratio:  0.81        Pulmonic Diam: 1.90 cm                            Qp/Qs:         0.58 Mihai Croitoru MD Electronically signed by Sanda Klein MD Signature Date/Time: 08/02/2019/11:29:09 AM    Final    CT HEAD CODE STROKE WO CONTRAST  Result Date: 08/01/2019 CLINICAL DATA:  Code stroke. Neuro deficit, acute, stroke  suspected. Additional history provided: Weight lifter, began to experience tingling of the left side of head and left arm around 8 a.m. today, patient reports feeling off balance, patient currently taking oral chemotherapy, history of mantle cell lymphoma. EXAM: CT HEAD WITHOUT CONTRAST TECHNIQUE: Contiguous axial images were obtained from the base of the skull through the vertex without intravenous contrast. COMPARISON:  PET-CT 05/09/2019, CT head 01/23/2015. FINDINGS: Brain: There is limited assessment for intracranial manifestations of lymphoma on this noncontrast head CT. Stable, mild generalized parenchymal atrophy. There is a small cortically based ischemic infarct within the mid right frontal lobe (series 12, image 21) (series 4, image 30). This is new from prior head CT 01/23/2015 and age-indeterminate, although favored chronic given appearance. There is a small lacunar infarct within the superior left cerebellum which appears chronic and in retrospect was likely present on prior head CT 01/23/2015. Minimal ill-defined hypoattenuation within the cerebral white matter is nonspecific, but consistent with chronic small vessel ischemic disease. There is no acute intracranial hemorrhage. No extra-axial fluid collection. No evidence of intracranial mass. No midline shift. Vascular: No hyperdense vessel.  Atherosclerotic calcifications. Skull: Normal. Negative for fracture or focal lesion. Sinuses/Orbits: Visualized orbits show no acute finding. No significant paranasal sinus disease or mastoid effusion at the imaged levels. ASPECTS [facility] Stroke Program Early CT Score) - Ganglionic level infarction (caudate, lentiform nuclei, internal capsule, insula, M1-M3 cortex): 7 - Supraganglionic infarction (M4-M6 cortex): 2 Total score (0-10 with 10 being normal): 10 These results were called by telephone at the time of interpretation on 08/01/2019 at 12:24 pm to provider [facility] , who verbally acknowledged these  results. IMPRESSION: 1.  A small cortical infarct within the mid right frontal lobe is age-indeterminate, but favored chronic given appearance. ASPECTS is 9. Consider brain MRI for further evaluation. 2. Small infarct within the superior left cerebellar hemisphere, which appears chronic and in retrospect, was likely present on prior head CT 01/23/2015. 3. Stable mild generalized parenchymal atrophy and chronic small vessel ischemic disease. 4. Please note there is limited assessment for intracranial manifestations of lymphoma on this non-contrast head CT. Electronically Signed   By: Kellie Simmering DO   On: 08/01/2019 12:25   VAS US CAROTID (at [facility] and [facility] only)  Result Date: 08/02/2019 Carotid Arterial Duplex Study Indications:       TIA and Numbness. Risk Factors:      Hypertension, hyperlipidemia. Other Factors:     Non-Hodgkin Lymphoma. Limitations        Today's exam was limited due to patient anatomy. Comparison Study:  No prior study Performing Technologist: Maudry Mayhew MHA, RDMS, RVT, RDCS  Examination Guidelines: A complete evaluation includes B-mode imaging, spectral Doppler, color Doppler, and power Doppler as needed of all accessible portions of each vessel. Bilateral testing is considered an integral part of a complete examination. Limited examinations for reoccurring indications may be performed as noted.  Right Carotid Findings: +----------+--------+--------+--------+-----------------------+--------+           PSV cm/sEDV cm/sStenosisPlaque Description     Comments +----------+--------+--------+--------+-----------------------+--------+ CCA Prox  1       0                                               +----------+--------+--------+--------+-----------------------+--------+ CCA Distal1       0               smooth and heterogenous         +----------+--------+--------+--------+-----------------------+--------+ ICA Prox  1       0               smooth and homogeneous           +----------+--------+--------+--------+-----------------------+--------+ ICA Distal1       0                                               +----------+--------+--------+--------+-----------------------+--------+ ECA       1       0               smooth and heterogenous         +----------+--------+--------+--------+-----------------------+--------+ +----------+--------+-------+----------------+-------------------+           PSV cm/sEDV cmsDescribe        Arm Pressure (mmHG) +----------+--------+-------+----------------+-------------------+ Jarrett Ables              Multiphasic, [id number]                 +----------+--------+-------+----------------+-------------------+ +---------+--------+-+--------+-+---------+ VertebralPSV cm/s0EDV cm/s0Antegrade +---------+--------+-+--------+-+---------+  Left Carotid Findings: +----------+--------+-------+--------+--------------------------------+--------+           PSV cm/sEDV    StenosisPlaque Description              Comments                   cm/s                                                    +----------+--------+-------+--------+--------------------------------+--------+  CCA Prox  1       0                                                       +----------+--------+-------+--------+--------------------------------+--------+ CCA Distal1       0              smooth, heterogenous and                                                  calcific                                 +----------+--------+-------+--------+--------------------------------+--------+ ICA Prox  3       1      60-79%  smooth, heterogenous and                                                  calcific                                 +----------+--------+-------+--------+--------------------------------+--------+ ICA Distal1       0                                                        +----------+--------+-------+--------+--------------------------------+--------+ ECA       1       0                                                       +----------+--------+-------+--------+--------------------------------+--------+ +----------+--------+--------+------------+-------------------+           PSV cm/sEDV cm/sDescribe    Arm Pressure (mmHG) +----------+--------+--------+------------+-------------------+ Subclavian                Not assessed134                 +----------+--------+--------+------------+-------------------+ +---------+--------+--+--------+--+----------+ VertebralPSV cm/s-1EDV cm/s-0Retrograde +---------+--------+--+--------+--+----------+   Summary: Right Carotid: Velocities in the right ICA are consistent with a 1-39% stenosis. Left Carotid: Velocities in the left ICA are consistent with a 60-79% stenosis. Vertebrals:  Right vertebral artery demonstrates antegrade flow. Left vertebral              artery demonstrates retrograde flow. Subclavians: Left subclavian artery was not visualized. Normal flow hemodynamics              were seen in the right subclavian artery. *See table(s) above for measurements and observations.  Electronically signed by Antony Contras MD on 08/02/2019 at 8:47:01 AM.    Final     Subjective: Eager to go home  Discharge Exam: Vitals:   08/03/19 1223  08/03/19 1519  BP: (!) 116/94 129/86  Pulse: 60 92  Resp: 18 18  Temp: 98 F (36.7 C) 98.1 F (36.7 C)  SpO2: 98% 99%   Vitals:   08/03/19 0500 08/03/19 0742 08/03/19 1223 08/03/19 1519  BP:  121/90 (!) 116/94 129/86  Pulse:  61 60 92  Resp:  16 18 18   Temp:  97.8 F (36.6 C) 98 F (36.7 C) 98.1 F (36.7 C)  TempSrc:  Oral Oral Oral  SpO2:  98% 98% 99%  Weight: 90.6 kg     Height:        General: Pt is alert, awake, not in acute distress Cardiovascular: RRR, S1/S2 +, no rubs, no gallops Respiratory: CTA bilaterally, no wheezing, no rhonchi Abdominal: Soft,  NT, ND, bowel sounds + Extremities: no edema, no cyanosis   The results of significant diagnostics from this hospitalization (including imaging, microbiology, ancillary and laboratory) are listed below for reference.     Microbiology: Recent Results (from the past 240 hour(s))  SARS Coronavirus 2 by RT PCR (hospital order, performed in [facility] hospital lab) Nasopharyngeal Nasopharyngeal Swab     Status: None   Collection Time: 08/01/19  2:21 PM   Specimen: Nasopharyngeal Swab  Result Value Ref Range Status   SARS Coronavirus 2 NEGATIVE NEGATIVE Final    Comment: (NOTE) SARS-CoV-2 target nucleic acids are NOT DETECTED.  The SARS-CoV-2 RNA is generally detectable in upper and lower respiratory specimens during the acute phase of infection. The lowest concentration of SARS-CoV-2 viral copies this assay can detect is 250 copies / mL. A negative result does not preclude SARS-CoV-2 infection and should not be used as the sole basis for treatment or other patient management decisions.  A negative result may occur with improper specimen collection / handling, submission of specimen other than nasopharyngeal swab, presence of viral mutation(s) within the areas targeted by this assay, and inadequate number of viral copies (<250 copies / mL). A negative result must be combined with clinical observations, patient history, and epidemiological information.  Fact Sheet for Patients:   StrictlyIdeas.no  Fact Sheet for Healthcare Providers: BankingDealers.co.za  This test is not yet approved or  cleared by the [place] FDA and has been authorized for detection and/or diagnosis of SARS-CoV-2 by FDA under an Emergency Use Authorization (EUA).  This EUA will remain in effect (meaning this test can be used) for the duration of the COVID-19 declaration under Section 564(b)(1) of the Act, 21 U.S.C. section 360bbb-3(b)(1), unless the  authorization is terminated or revoked sooner.  Performed at [facility], [place] [address] [place], [place] [address]      Labs: BNP (last 3 results) No results for input(s): BNP in the last 8760 hours. Basic Metabolic Panel: Recent Labs  Lab 07/28/19 1000 08/01/19 1218 08/01/19 1227 08/03/19 0435  NA 137 139 139 137  K 3.9 3.6 3.5 4.0  CL 102 101 100 101  CO2 26 25  --  25  GLUCOSE 94 98 95 99  BUN 24* 23 22 21   CREATININE 1.30* 1.33* 1.30* 1.26*  CALCIUM 8.9 9.3  --  9.2  MG  --   --   --  2.2  PHOS  --   --   --  4.1   Liver Function Tests: Recent Labs  Lab 07/28/19 1000 08/01/19 1218 08/03/19 0435  AST 17 21 17   ALT 16 18 16   ALKPHOS 36* 35* 31*  BILITOT 0.8 1.0 1.2  PROT 6.8  7.4 6.4*  ALBUMIN 4.0 4.9 3.8   No results for input(s): LIPASE, AMYLASE in the last 168 hours. No results for input(s): AMMONIA in the last 168 hours. CBC: Recent Labs  Lab 07/28/19 1000 08/01/19 1218 08/01/19 1227 08/03/19 0435  WBC 4.4 4.6  --  4.4  NEUTROABS 2.3 2.9  --   --   HGB 12.6* 13.2 13.9 13.1  HCT 39.0 40.5 41.0 40.7  MCV 87.1 86.9  --  85.3  PLT 299 301  --  280   Cardiac Enzymes: No results for input(s): CKTOTAL, CKMB, CKMBINDEX, TROPONINI in the last 168 hours. BNP: Invalid input(s): POCBNP CBG: Recent Labs  Lab 08/01/19 1218  GLUCAP 94   D-Dimer No results for input(s): DDIMER in the last 72 hours. Hgb A1c Recent Labs    08/01/19 1218  HGBA1C 6.1*   Lipid Profile Recent Labs    08/01/19 1218  CHOL 227*  HDL 54  LDLCALC 155*  TRIG 91  CHOLHDL 4.2   Thyroid function studies Recent Labs    08/03/19 0435  TSH 1.924   Anemia work up No results for input(s): VITAMINB12, FOLATE, FERRITIN, TIBC, IRON, RETICCTPCT in the last 72 hours. Urinalysis    Component Value Date/Time   COLORURINE STRAW (A) 08/01/2019 1426   APPEARANCEUR CLEAR 08/01/2019 1426   LABSPEC 1.008 08/01/2019 1426   PHURINE 7.0 08/01/2019 1426    GLUCOSEU NEGATIVE 08/01/2019 1426   HGBUR NEGATIVE 08/01/2019 1426   BILIRUBINUR NEGATIVE 08/01/2019 1426   KETONESUR NEGATIVE 08/01/2019 1426   PROTEINUR NEGATIVE 08/01/2019 1426   NITRITE NEGATIVE 08/01/2019 1426   LEUKOCYTESUR NEGATIVE 08/01/2019 1426   Sepsis Labs Invalid input(s): PROCALCITONIN,  WBC,  LACTICIDVEN Microbiology Recent Results (from the past 240 hour(s))  SARS Coronavirus 2 by RT PCR (hospital order, performed in [place] hospital lab) Nasopharyngeal Nasopharyngeal Swab     Status: None   Collection Time: 08/01/19  2:21 PM   Specimen: Nasopharyngeal Swab  Result Value Ref Range Status   SARS Coronavirus 2 NEGATIVE NEGATIVE Final    Comment: (NOTE) SARS-CoV-2 target nucleic acids are NOT DETECTED.  The SARS-CoV-2 RNA is generally detectable in upper and lower respiratory specimens during the acute phase of infection. The lowest concentration of SARS-CoV-2 viral copies this assay can detect is 250 copies / mL. A negative result does not preclude SARS-CoV-2 infection and should not be used as the sole basis for treatment or other patient management decisions.  A negative result may occur with improper specimen collection / handling, submission of specimen other than nasopharyngeal swab, presence of viral mutation(s) within the areas targeted by this assay, and inadequate number of viral copies (<250 copies / mL). A negative result must be combined with clinical observations, patient history, and epidemiological information.  Fact Sheet for Patients:   StrictlyIdeas.no  Fact Sheet for Healthcare Providers: BankingDealers.co.za  This test is not yet approved or  cleared by the [place] FDA and has been authorized for detection and/or diagnosis of SARS-CoV-2 by FDA under an Emergency Use Authorization (EUA).  This EUA will remain in effect (meaning this test can be used) for the duration of the COVID-19  declaration under Section 564(b)(1) of the Act, 21 U.S.C. section 360bbb-3(b)(1), unless the authorization is terminated or revoked sooner.  Performed at [facility], [place] [address] [place], [place] [address]    Time spent: 30 min  SIGNED:   Marylu Lund, MD  [facility] 08/03/2019, 4:20 PM  If 7PM-7AM,  please contact night-coverage

## 2019-08-03 NOTE — Progress Notes (Signed)
ANTICOAGULATION CONSULT NOTE - follow up Pharmacy Consult for Heparin Indication: PAF / CVA  No Known Allergies  Patient Measurements: Height: 5' 9.5" (176.5 cm) Weight: 90.6 kg (199 lb 11.2 oz) IBW/kg (Calculated) : 71.85 Heparin Dosing Weight: 92  Vital Signs: Temp: 97.8 F (36.6 C) (06/30 0742) Temp Source: Oral (06/30 0742) BP: 121/90 (06/30 0742) Pulse Rate: 61 (06/30 0742)  Labs: Recent Labs    08/01/19 1218 08/01/19 1218 08/01/19 1227 08/03/19 0435 08/03/19 1058  HGB 13.2   < > 13.9 13.1  --   HCT 40.5  --  41.0 40.7  --   PLT 301  --   --  280  --   APTT 27  --   --   --   --   LABPROT 11.8  --   --   --   --   INR 0.9  --   --   --   --   HEPARINUNFRC  --   --   --  0.51 0.53  CREATININE 1.33*  --  1.30* 1.26*  --    < > = values in this interval not displayed.    Estimated Creatinine Clearance: 64.8 mL/min (A) (by C-G formula based on SCr of 1.26 mg/dL (H)).  Assessment: 69 year old male to begin heparin for PAfib / CVA.   Scr stable Heparin level 0.53,  therapeutic on heparin gtt 1000 units/hr CBC wnl stable.  No bleeding noted.   Goal of Therapy:  Heparin level 0.3-0.5 units/ml Monitor platelets by anticoagulation protocol: Yes   Plan:  Continue Heparin drip at 1000 units / hr (No heparin bolus per CVA protocol)  Daily heparin level, CBC  Thank you Nicole Cella, RPh Clinical Pharmacist [phone] Please check AMION for all [place] phone numbers After 10:00 PM, call [place] [phone]   08/03/2019,11:57 AM

## 2019-08-03 NOTE — Progress Notes (Signed)
Pt has been discharged home via walking down to wife at the curbside. Patient refused the wheelchair and has a steady gait with a stand-by assist. RN walked patient down. All IV were disconnected. Tele was disconnected. AVS documentation was given and gone over. Pt denies any pain and all belongings were sent with them. Patient sent in good spirits.

## 2019-08-03 NOTE — Progress Notes (Signed)
ANTICOAGULATION CONSULT NOTE - Follow Up Consult  Pharmacy Consult for heparin Indication: PAF in setting of CVA  Labs: Recent Labs    08/01/19 1218 08/01/19 1218 08/01/19 1227 08/03/19 0435  HGB 13.2   < > 13.9 13.1  HCT 40.5  --  41.0 40.7  PLT 301  --   --  280  APTT 27  --   --   --   LABPROT 11.8  --   --   --   INR 0.9  --   --   --   HEPARINUNFRC  --   --   --  0.51  CREATININE 1.33*  --  1.30*  --    < > = values in this interval not displayed.    Assessment: 69yo male therapeutic on heparin though at very upper end of low goal given CVA; no gtt issues or signs of bleeding per RN.  Goal of Therapy:  Heparin level 0.3-0.5 units/ml   Plan:  Will decrease heparin gtt by 1 unit/kg/hr to 1000 units/hr and check level in 6 hours.    Wynona Neat, PharmD, BCPS  08/03/2019,5:32 AM

## 2019-08-03 NOTE — Consult Note (Addendum)
Cardiology Consultation:   Patient ID: [name]; [id number]; 1950/08/20   Admit date: 08/01/2019 Date of Consult: 08/03/2019  Primary Care Provider: Shelda Pal, DO Primary Cardiologist: Pixie Casino, MD New Primary Electrophysiologist:  None   Patient Profile:   [name] is a 69 y.o. male with a hx of HTN, HLD, GERD, non-Hodgkin's lymphoma on oral chemo, who is being seen today for the evaluation of left ventricular dysfunction at the request of Dr. Wyline Copas.  History of Present Illness:   Mr. Alkire was in his usual state of health until yesterday morning.  He then developed numbness on the left side of his face along with numbness and tingling in his left wrist and shoulder.  He felt that he was off balance.  When his symptoms did not resolve, he came to the emergency room.    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  He also had a (likely chronic) mid right frontal lobe infarct and chronic superior left cerebellar infarct.    He had carotid disease with a left ICA 60-79%.  His EF was 35-40% on echo.  Cardiology was asked to see him because of his decreased EF and possible ischemic cardiomyopathy.  He exercises regularly, lifting weights and doing calisthenics. He was planning to start back running this week.   He has never had chest pain with exertion.  He denies any dyspnea on exertion.  He has never felt limited in any way by chest pain or shortness of breath.  Prior to being diagnosed with lymphoma, he exercises regularly and describes his intensity as an 11/10.  After being diagnosed with lymphoma, he continued exercise but says the intensity is currently a 5/10.  He has no history of orthopnea or PND.  He has no history of lower extremity edema.  He has no history of palpitations.  No history of presyncope or syncope.  His neuro symptoms have almost completely resolved.  There is only one small patch of numbness  behind his left ear.  He hopes to be discharged today.  He has been told that his cholesterol is too high.  His physician has mentioned putting him on atorvastatin.  However, he is still on the Mevacor.   Past Medical History:  Diagnosis Date   Anemia    Cancer ([place])    Lymphoma   Essential hypertension    GERD (gastroesophageal reflux disease)    GI bleed    HLD (hyperlipidemia)    Weakness     Past Surgical History:  Procedure Laterality Date   ESOPHAGOGASTRODUODENOSCOPY Left 01/18/2015   Procedure: ESOPHAGOGASTRODUODENOSCOPY (EGD);  Surgeon: Wilford Corner, MD;  Location: [facility] ENDOSCOPY;  Service: Endoscopy;  Laterality: Left;   INGUINAL HERNIA REPAIR Right 01/22/2015   Procedure: RIGHT INGUINAL LYMPH NODE BX;  Surgeon: Donnie Mesa, MD;  Location: [facility];  Service: General;  Laterality: Right;   MEDIASTINOSCOPY N/A 05/05/2017   Procedure: MEDIASTINOSCOPY;  Surgeon: Grace Isaac, MD;  Location: [facility] OR;  Service: Thoracic;  Laterality: N/A;   PORTACATH PLACEMENT  01/26/2015    power port with tip SVC/RA [place]   SKIN SURGERY     Small benign cysts over left scalp removed   VIDEO BRONCHOSCOPY WITH ENDOBRONCHIAL ULTRASOUND N/A 05/05/2017   Procedure: VIDEO BRONCHOSCOPY WITH ENDOBRONCHIAL ULTRASOUND;  Surgeon: Grace Isaac, MD;  Location: [facility];  Service: Thoracic;  Laterality: N/A;     Prior to Admission medications   Medication Sig  Start Date End Date Taking? Authorizing Provider  diazepam (VALIUM) 5 MG tablet Take 1 tablet 1 hour prior to scan and repeat if needed 05/03/19  Yes Magrinat, Virgie Dad, MD  IMBRUVICA 420 MG TABS TAKE 1 TABLET BY MOUTH DAILY. 07/11/19  Yes Magrinat, Virgie Dad, MD  lisinopril-hydrochlorothiazide (ZESTORETIC) 20-25 MG tablet TAKE 1 TABLET BY MOUTH EVERY DAY Patient taking differently: Take 1 tablet by mouth daily.  05/24/19  Yes Magrinat, Virgie Dad, MD  lovastatin (MEVACOR) 20 MG tablet TAKE 1 TABLET BY MOUTH EVERYDAY AT  BEDTIME Patient taking differently: Take 20 mg by mouth at bedtime.  01/18/19  Yes Magrinat, Virgie Dad, MD    Inpatient Medications: Scheduled Meds:   stroke: mapping our early stages of recovery book   Does not apply Once   aspirin EC  81 mg Oral Daily   atorvastatin  80 mg Oral Daily   Chlorhexidine Gluconate Cloth  6 each Topical Daily   clopidogrel  75 mg Oral Daily   Continuous Infusions:  heparin 1,000 Units/hr (08/03/19 0600)   PRN Meds: acetaminophen **OR** acetaminophen, ondansetron **OR** ondansetron (ZOFRAN) IV, senna-docusate  Allergies:   No Known Allergies  Social History:   Social History   Socioeconomic History   Marital status: Married    Spouse name: Not on file   Number of children: Not on file   Years of education: Not on file   Highest education level: Not on file  Occupational History   Occupation: Retired  Tobacco Use   Smoking status: Never Smoker   Smokeless tobacco: Never Used  Scientific laboratory technician Use: Never used  Substance and Sexual Activity   Alcohol use: Yes    Comment: occasional beer   Drug use: No   Sexual activity: Not on file  Other Topics Concern   Not on file  Social History Narrative   Not on file   Social Determinants of Health   Financial Resource Strain:    Difficulty of Paying Living Expenses:   Food Insecurity:    Worried About Charity fundraiser in the Last Year:    Arboriculturist in the Last Year:   Transportation Needs:    Film/video editor (Medical):    Lack of Transportation (Non-Medical):   Physical Activity:    Days of Exercise per Week:    Minutes of Exercise per Session:   Stress:    Feeling of Stress :   Social Connections:    Frequency of Communication with Friends and Family:    Frequency of Social Gatherings with Friends and Family:    Attends Religious Services:    Active Member of Clubs or Organizations:    Attends Music therapist:     Marital Status:   Intimate Partner Violence:    Fear of Current or Ex-Partner:    Emotionally Abused:    Physically Abused:    Sexually Abused:     Family History:   Family History  Problem Relation Age of Onset   Hypertension Mother    Hypertension Father    Kidney failure Father    Hypertension Sister    Diabetes Sister    Prostate cancer Brother    Lupus Sister    Family Status:  Family Status  Relation Name Status   Mother  Alive   Father  Deceased   Sister  Alive   Sister  Alive   Brother  Alive   Sister  Alive  ROS:  Please see the history of present illness.  All other ROS reviewed and negative.     Physical Exam/Data:   Vitals:   08/03/19 0300 08/03/19 0500 08/03/19 0742 08/03/19 1223  BP: 131/84  121/90 (!) 116/94  Pulse: 66  61 60  Resp: 19  16 18   Temp: 97.7 F (36.5 C)  97.8 F (36.6 C) 98 F (36.7 C)  TempSrc: Oral  Oral Oral  SpO2: 97%  98% 98%  Weight:  90.6 kg    Height:        Intake/Output Summary (Last 24 hours) at 08/03/2019 1415 Last data filed at 08/03/2019 0826 Gross per 24 hour  Intake 199.37 ml  Output --  Net 199.37 ml    Last 3 Weights 08/03/2019 08/01/2019 04/25/2019  Weight (lbs) 199 lb 11.2 oz 202 lb 207 lb 6.4 oz  Weight (kg) 90.583 kg 91.627 kg 94.076 kg     Body mass index is 29.07 kg/m.   General:  Well nourished, well developed, male in no acute distress HEENT: normal Lymph: no adenopathy Neck: JVD -not elevated Endocrine:  No thryomegaly Vascular: No carotid bruits; 4/4 extremity pulses 2+  Cardiac:  normal S1, S2; RRR; no murmur Lungs:  clear bilaterally, no wheezing, rhonchi or rales  Abd: soft, nontender, no hepatomegaly  Ext: no edema Musculoskeletal:  No deformities, BUE and BLE strength normal and equal Skin: warm and dry  Neuro:  CNs 2-12 intact, no focal abnormalities noted Psych:  Normal affect   EKG:  The EKG was personally reviewed and demonstrates: Sinus rhythm, heart rate 72,  frequent PACs, rate is slower and more ectopy than previous ECG but otherwise no change Telemetry:  Telemetry was personally reviewed and demonstrates: Sinus rhythm with PACs and PVCs   CV studies:   ECHO: 08/02/2019 1. Left ventricular ejection fraction, by estimation, is 35 to 40%. The  left ventricle has moderately decreased function. The left ventricle  demonstrates global hypokinesis. The left ventricular internal cavity size  was borderline dilated. Left ventricular diastolic parameters are consistent with Grade I diastolic dysfunction (impaired relaxation).  2. Right ventricular systolic function is normal. The right ventricular  size is normal. Tricuspid regurgitation signal is inadequate for assessing  PA pressure.  3. Left atrial size was moderately dilated.  4. The mitral valve is normal in structure. No evidence of mitral valve  regurgitation.  5. The aortic valve is tricuspid. Aortic valve regurgitation is mild to  moderate. Mild to moderate aortic valve sclerosis/calcification is  present, without any evidence of aortic stenosis.  6. Aortic dilatation noted. There is borderline dilatation of the  ascending aorta measuring 38 mm.  7. The inferior vena cava is normal in size with greater than 50%  respiratory variability, suggesting right atrial pressure of 3 mmHg.   Comparison(s): Prior images unable to be directly viewed, comparison made  by report only. The left ventricular function is significantly worse. Very  frequent PVCs are seen during the study.   CATH: None   Laboratory Data:   Chemistry Recent Labs  Lab 07/28/19 1000 07/28/19 1000 08/01/19 1218 08/01/19 1227 08/03/19 0435  NA 137   < > 139 139 137  K 3.9   < > 3.6 3.5 4.0  CL 102   < > 101 100 101  CO2 26  --  25  --  25  GLUCOSE 94   < > 98 95 99  BUN 24*   < > 23 22 21  CREATININE 1.30*   < > 1.33* 1.30* 1.26*  CALCIUM 8.9  --  9.3  --  9.2  GFRNONAA 57*  --  55*  --  59*  GFRAA >60   --  >60  --  >60  ANIONGAP 9  --  13  --  11   < > = values in this interval not displayed.    Lab Results  Component Value Date   ALT 16 08/03/2019   AST 17 08/03/2019   ALKPHOS 31 (L) 08/03/2019   BILITOT 1.2 08/03/2019   Hematology Recent Labs  Lab 07/28/19 1000 07/28/19 1000 08/01/19 1218 08/01/19 1227 08/03/19 0435  WBC 4.4  --  4.6  --  4.4  RBC 4.48  --  4.66  --  4.77  HGB 12.6*   < > 13.2 13.9 13.1  HCT 39.0   < > 40.5 41.0 40.7  MCV 87.1  --  86.9  --  85.3  MCH 28.1  --  28.3  --  27.5  MCHC 32.3  --  32.6  --  32.2  RDW 13.7  --  13.6  --  13.4  PLT 299  --  301  --  280   < > = values in this interval not displayed.   Cardiac Enzymes High Sensitivity Troponin:  No results for input(s): TROPONINIHS in the last 720 hours.    BNPNo results for input(s): BNP, PROBNP in the last 168 hours.  DDimer No results for input(s): DDIMER in the last 168 hours. TSH:  Lab Results  Component Value Date   TSH 1.924 08/03/2019   Lipids: Lab Results  Component Value Date   CHOL 227 (H) 08/01/2019   HDL 54 08/01/2019   LDLCALC 155 (H) 08/01/2019   TRIG 91 08/01/2019   CHOLHDL 4.2 08/01/2019   HgbA1c: Lab Results  Component Value Date   HGBA1C 6.1 (H) 08/01/2019   Magnesium:  Magnesium  Date Value Ref Range Status  08/03/2019 2.2 1.7 - 2.4 mg/dL Final    Comment:    Performed at [facility], [place] [address] [place], [place] [address]    Radiology/Studies:  MR ANGIO HEAD WO CONTRAST  Result Date: 08/02/2019 CLINICAL DATA:  Initial evaluation for left-sided paresthesias, gait imbalance. History of lymphoma. EXAM: MRI HEAD WITHOUT AND WITH CONTRAST MRA HEAD WITHOUT CONTRAST TECHNIQUE: Multiplanar, multiecho pulse sequences of the brain and surrounding structures were obtained without and with intravenous contrast. Angiographic images of the head were obtained using MRA technique without contrast. CONTRAST:  56mL GADAVIST GADOBUTROL 1 MMOL/ML IV SOLN  COMPARISON:  Prior CT from 08/01/2019. FINDINGS: MRI HEAD FINDINGS Brain: Cerebral volume within normal limits for age. Few scattered subcentimeter foci of T2/FLAIR hyperintensity noted involving the periventricular and deep white matter both cerebral hemispheres, nonspecific, but most like related to chronic small vessel ischemic disease. Overall, changes are minimal for age. Small remote cortical infarct noted at the high anterior right frontal lobe, corresponding with abnormality from prior CT (series 11, image 21). Few scattered small remote bilateral cerebellar infarcts noted as well. 5 mm focus of restricted diffusion seen involving the right dorsal pons, consistent with a small acute ischemic infarct (series 5, image 66). No associated hemorrhage or mass effect. No other abnormal foci of diffusion abnormality to suggest acute or subacute ischemia. Gray-white matter differentiation otherwise maintained. No foci of susceptibility artifact to suggest acute or chronic intracranial hemorrhage. No mass lesion, midline shift or mass effect. No hydrocephalus or  extra-axial fluid collection. Pituitary gland suprasellar region within normal limits. Midline structures intact. No abnormal enhancement following contrast administration. Incidental note made of a left cerebellar DVA. Vascular: Major intracranial vascular flow voids are maintained. Skull and upper cervical spine: Craniocervical junction within normal limits. Bone marrow signal intensity normal. No focal marrow replacing lesion. No scalp soft tissue abnormality. Sinuses/Orbits: Globes and orbital soft tissues within normal limits. Paranasal sinuses are largely clear. Small left mastoid effusion noted, of doubtful significance. Other: None. MRA HEAD FINDINGS ANTERIOR CIRCULATION: Examination mildly degraded by motion artifact. Visualized distal cervical segments of the internal carotid arteries are widely patent with antegrade flow. Petrous segments widely  patent. Mild atheromatous irregularity throughout the carotid siphons without flow-limiting stenosis. Right A1 segment widely patent. Left A1 hypoplastic and/or absent, accounting for the slightly diminutive left ICA is compared to the right. Normal anterior communicating artery complex. Anterior cerebral arteries widely patent to their distal aspects without stenosis. No M1 stenosis or occlusion. Normal MCA bifurcations. Distal MCA branches well perfused and symmetric. POSTERIOR CIRCULATION: Vertebral arteries patent to the vertebrobasilar junction without stenosis. Right PICA patent. Left PICA not seen. Basilar somewhat diminutive and mildly tortuous but is widely patent to its distal aspect without stenosis. Superior cerebral arteries patent bilaterally. PCAs supplied via the basilar or as well as prominent and robust bilateral posterior communicating arteries. Both PCAs well perfused to their distal aspects without stenosis. No intracranial aneurysm. IMPRESSION: MRI HEAD IMPRESSION: 1. 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 2. Small remote cortical infarct involving the high anterior right frontal lobe, corresponding with abnormality seen on prior CT. 3. Few additional scattered small remote bilateral cerebellar infarcts. MRA HEAD IMPRESSION: Negative intracranial MRA. No large vessel occlusion, hemodynamically significant stenosis, or other acute vascular abnormality. Electronically Signed   By: Jeannine Boga M.D.   On: 08/02/2019 02:14   MR BRAIN W WO CONTRAST  Result Date: 08/02/2019 CLINICAL DATA:  Initial evaluation for left-sided paresthesias, gait imbalance. History of lymphoma. EXAM: MRI HEAD WITHOUT AND WITH CONTRAST MRA HEAD WITHOUT CONTRAST TECHNIQUE: Multiplanar, multiecho pulse sequences of the brain and surrounding structures were obtained without and with intravenous contrast. Angiographic images of the head were obtained using MRA technique without contrast.  CONTRAST:  77mL GADAVIST GADOBUTROL 1 MMOL/ML IV SOLN COMPARISON:  Prior CT from 08/01/2019. FINDINGS: MRI HEAD FINDINGS Brain: Cerebral volume within normal limits for age. Few scattered subcentimeter foci of T2/FLAIR hyperintensity noted involving the periventricular and deep white matter both cerebral hemispheres, nonspecific, but most like related to chronic small vessel ischemic disease. Overall, changes are minimal for age. Small remote cortical infarct noted at the high anterior right frontal lobe, corresponding with abnormality from prior CT (series 11, image 21). Few scattered small remote bilateral cerebellar infarcts noted as well. 5 mm focus of restricted diffusion seen involving the right dorsal pons, consistent with a small acute ischemic infarct (series 5, image 66). No associated hemorrhage or mass effect. No other abnormal foci of diffusion abnormality to suggest acute or subacute ischemia. Gray-white matter differentiation otherwise maintained. No foci of susceptibility artifact to suggest acute or chronic intracranial hemorrhage. No mass lesion, midline shift or mass effect. No hydrocephalus or extra-axial fluid collection. Pituitary gland suprasellar region within normal limits. Midline structures intact. No abnormal enhancement following contrast administration. Incidental note made of a left cerebellar DVA. Vascular: Major intracranial vascular flow voids are maintained. Skull and upper cervical spine: Craniocervical junction within normal limits. Bone marrow signal intensity normal.  No focal marrow replacing lesion. No scalp soft tissue abnormality. Sinuses/Orbits: Globes and orbital soft tissues within normal limits. Paranasal sinuses are largely clear. Small left mastoid effusion noted, of doubtful significance. Other: None. MRA HEAD FINDINGS ANTERIOR CIRCULATION: Examination mildly degraded by motion artifact. Visualized distal cervical segments of the internal carotid arteries are widely  patent with antegrade flow. Petrous segments widely patent. Mild atheromatous irregularity throughout the carotid siphons without flow-limiting stenosis. Right A1 segment widely patent. Left A1 hypoplastic and/or absent, accounting for the slightly diminutive left ICA is compared to the right. Normal anterior communicating artery complex. Anterior cerebral arteries widely patent to their distal aspects without stenosis. No M1 stenosis or occlusion. Normal MCA bifurcations. Distal MCA branches well perfused and symmetric. POSTERIOR CIRCULATION: Vertebral arteries patent to the vertebrobasilar junction without stenosis. Right PICA patent. Left PICA not seen. Basilar somewhat diminutive and mildly tortuous but is widely patent to its distal aspect without stenosis. Superior cerebral arteries patent bilaterally. PCAs supplied via the basilar or as well as prominent and robust bilateral posterior communicating arteries. Both PCAs well perfused to their distal aspects without stenosis. No intracranial aneurysm. IMPRESSION: MRI HEAD IMPRESSION: 1. 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 2. Small remote cortical infarct involving the high anterior right frontal lobe, corresponding with abnormality seen on prior CT. 3. Few additional scattered small remote bilateral cerebellar infarcts. MRA HEAD IMPRESSION: Negative intracranial MRA. No large vessel occlusion, hemodynamically significant stenosis, or other acute vascular abnormality. Electronically Signed   By: Jeannine Boga M.D.   On: 08/02/2019 02:14   ECHOCARDIOGRAM COMPLETE  Result Date: 08/02/2019    ECHOCARDIOGRAM REPORT   Patient Name:   EKANSH SHERK Date of Exam: 08/02/2019 Medical Rec #:  [id number]       Height:       69.5 in Accession #:    [id number]      Weight:       202.0 lb Date of Birth:  Sep 20, 1950      BSA:          2.085 m Patient Age:    51 years        BP:           136/89 mmHg Patient Gender: M               HR:            106 bpm. Exam Location:  Inpatient Procedure: 2D Echo and Strain Analysis Indications:    TIA 435.9 / G45.9  History:        Patient has prior history of Echocardiogram examinations, most                 recent 01/25/2015. Risk Factors:Hypertension and Dyslipidemia.                 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 Left carotid artery                 stenosis 60-79%.  Sonographer:    Darlina Sicilian RDCS Referring Phys: [id number] [place]  1. Left ventricular ejection fraction, by estimation, is 35 to 40%. The left ventricle has moderately decreased function. The left ventricle demonstrates global hypokinesis. The left ventricular internal cavity size was borderline dilated. Left ventricular diastolic parameters are consistent with Grade I diastolic dysfunction (impaired relaxation).  2. Right ventricular systolic function is normal. The right ventricular size is normal. Tricuspid regurgitation signal is inadequate for assessing PA pressure.  3. Left atrial size was moderately dilated.  4. The mitral valve is normal in structure. No evidence of mitral valve regurgitation.  5. The aortic valve is tricuspid. Aortic valve regurgitation is mild to moderate. Mild to moderate aortic valve sclerosis/calcification is present, without any evidence of aortic stenosis.  6. Aortic dilatation noted. There is borderline dilatation of the ascending aorta measuring 38 mm.  7. The inferior vena cava is normal in size with greater than 50% respiratory variability, suggesting right atrial pressure of 3 mmHg. Comparison(s): Prior images unable to be directly viewed, comparison made by report only. The left ventricular function is significantly worse. Very frequent PVCs are seen during the study.  Left ventricular ejection fraction, by estimation, is 35 to 40%. The left ventricle has moderately decreased function. The left ventricle demonstrates global hypokinesis. The left ventricular internal cavity  size was borderline dilated. There is no left  ventricular hypertrophy. Left ventricular diastolic parameters are consistent with Grade I diastolic dysfunction (impaired relaxation). Normal left ventricular filling pressure. Right Ventricle: The right ventricular size is normal. No increase in right ventricular wall thickness. Right ventricular systolic function is normal. Tricuspid regurgitation signal is inadequate for assessing PA pressure. Left Atrium: Left atrial size was moderately dilated. Right Atrium: Right atrial size was normal in size. Pericardium: There is no evidence of pericardial effusion. Mitral Valve: The mitral valve is normal in structure. No evidence of mitral valve regurgitation. Tricuspid Valve: The tricuspid valve is normal in structure. Tricuspid valve regurgitation is not demonstrated. Aortic Valve: There is particularly dense calcification of the left coronary cusp. The aortic valve is tricuspid. Aortic valve regurgitation is mild to moderate. Mild to moderate aortic valve sclerosis/calcification is present, without any evidence of aortic stenosis. Aortic valve mean gradient measures 8.5 mmHg. Aortic valve peak gradient measures 20.2 mmHg. Aortic valve area, by VTI measures 2.25 cm. Pulmonic Valve: The pulmonic valve was grossly normal. Pulmonic valve regurgitation is not visualized. Aorta: Aortic dilatation noted. There is borderline dilatation of the ascending aorta measuring 38 mm. Venous: The inferior vena cava is normal in size with greater than 50% respiratory variability, suggesting right atrial pressure of 3 mmHg. No atrial level shunt detected by color flow Doppler.  LEFT VENTRICLE PLAX 2D LVIDd:         5.50 cm  Diastology LVIDs:         4.40 cm  LV e' lateral:   7.29 cm/s LV PW:         0.90 cm  LV E/e' lateral: 6.8 LV IVS:        1.00 cm  LV e' medial:    4.46 cm/s LVOT diam:     2.40 cm  LV E/e' medial:  11.1 LV SV:         80 LV SV Index:   38 LVOT Area:     4.52 cm  RIGHT  VENTRICLE RV S prime:     14.70 cm/s RVOT diam:      1.90 cm TAPSE (M-mode): 3.0 cm LEFT ATRIUM             Index       RIGHT ATRIUM           Index LA diam:        4.50 cm 2.16 cm/m  RA Area:     16.70 cm LA Vol (A2C):   52.4 ml 25.13 ml/m RA Volume:   42.50 ml  20.38 ml/m LA Vol (A4C):   64.0  ml 30.69 ml/m LA Biplane Vol: 60.1 ml 28.82 ml/m  AORTIC VALVE                    PULMONIC VALVE AV Area (Vmax):    1.96 cm     RVOT Peak grad: 3 mmHg AV Area (Vmean):   2.04 cm AV Area (VTI):     2.25 cm AV Vmax:           225.00 cm/s AV Vmean:          136.000 cm/s AV VTI:            0.355 m AV Peak Grad:      20.2 mmHg AV Mean Grad:      8.5 mmHg LVOT Vmax:         97.50 cm/s LVOT Vmean:        61.200 cm/s LVOT VTI:          0.176 m LVOT/AV VTI ratio: 0.50  AORTA Ao Root diam: 3.60 cm Ao Asc diam:  3.80 cm MITRAL VALVE MV Area (PHT): 2.95 cm    SHUNTS MV Decel Time: 257 msec    Systemic VTI:  0.18 m MV E velocity: 49.70 cm/s  Systemic Diam: 2.40 cm MV A velocity: 61.70 cm/s  Pulmonic VTI:  0.163 m MV E/A ratio:  0.81        Pulmonic Diam: 1.90 cm                            Qp/Qs:         0.58 Mihai Croitoru MD Electronically signed by Sanda Klein MD Signature Date/Time: 08/02/2019/11:29:09 AM    Final    CT HEAD CODE STROKE WO CONTRAST  Result Date: 08/01/2019 CLINICAL DATA:  Code stroke. Neuro deficit, acute, stroke suspected. Additional history provided: Weight lifter, began to experience tingling of the left side of head and left arm around 8 a.m. today, patient reports feeling off balance, patient currently taking oral chemotherapy, history of mantle cell lymphoma. EXAM: CT HEAD WITHOUT CONTRAST TECHNIQUE: Contiguous axial images were obtained from the base of the skull through the vertex without intravenous contrast. COMPARISON:  PET-CT 05/09/2019, CT head 01/23/2015. FINDINGS: Brain: There is limited assessment for intracranial manifestations of lymphoma on this noncontrast head CT. Stable, mild  generalized parenchymal atrophy. There is a small cortically based ischemic infarct within the mid right frontal lobe (series 12, image 21) (series 4, image 30). This is new from prior head CT 01/23/2015 and age-indeterminate, although favored chronic given appearance. There is a small lacunar infarct within the superior left cerebellum which appears chronic and in retrospect was likely present on prior head CT 01/23/2015. Minimal ill-defined hypoattenuation within the cerebral white matter is nonspecific, but consistent with chronic small vessel ischemic disease. There is no acute intracranial hemorrhage. No extra-axial fluid collection. No evidence of intracranial mass. No midline shift. Vascular: No hyperdense vessel.  Atherosclerotic calcifications. Skull: Normal. Negative for fracture or focal lesion. Sinuses/Orbits: Visualized orbits show no acute finding. No significant paranasal sinus disease or mastoid effusion at the imaged levels. ASPECTS [facility] Stroke Program Early CT Score) - Ganglionic level infarction (caudate, lentiform nuclei, internal capsule, insula, M1-M3 cortex): 7 - Supraganglionic infarction (M4-M6 cortex): 2 Total score (0-10 with 10 being normal): 10 These results were called by telephone at the time of interpretation on 08/01/2019 at 12:24 pm to provider [facility] , who verbally acknowledged these  results. IMPRESSION: 1. A small cortical infarct within the mid right frontal lobe is age-indeterminate, but favored chronic given appearance. ASPECTS is 9. Consider brain MRI for further evaluation. 2. Small infarct within the superior left cerebellar hemisphere, which appears chronic and in retrospect, was likely present on prior head CT 01/23/2015. 3. Stable mild generalized parenchymal atrophy and chronic small vessel ischemic disease. 4. Please note there is limited assessment for intracranial manifestations of lymphoma on this non-contrast head CT. Electronically Signed   By: Kellie Simmering DO   On: 08/01/2019 12:25   VAS US CAROTID (at [facility] and [facility] only)  Result Date: 08/02/2019 Carotid Arterial Duplex Study Indications:       TIA and Numbness. Risk Factors:      Hypertension, hyperlipidemia. Other Factors:     Non-Hodgkin Lymphoma. Limitations        Today's exam was limited due to patient anatomy. Comparison Study:  No prior study Performing Technologist: Maudry Mayhew MHA, RDMS, RVT, RDCS  Examination Guidelines: A complete evaluation includes B-mode imaging, spectral Doppler, color Doppler, and power Doppler as needed of all accessible portions of each vessel. Bilateral testing is considered an integral part of a complete examination. Limited examinations for reoccurring indications may be performed as noted.  Right Carotid Findings: +----------+--------+--------+--------+-----------------------+--------+             PSV cm/s EDV cm/s Stenosis Plaque Description      Comments  +----------+--------+--------+--------+-----------------------+--------+  CCA Prox   1        0                                                   +----------+--------+--------+--------+-----------------------+--------+  CCA Distal 1        0                 smooth and heterogenous           +----------+--------+--------+--------+-----------------------+--------+  ICA Prox   1        0                 smooth and homogeneous            +----------+--------+--------+--------+-----------------------+--------+  ICA Distal 1        0                                                   +----------+--------+--------+--------+-----------------------+--------+  ECA        1        0                 smooth and heterogenous           +----------+--------+--------+--------+-----------------------+--------+ +----------+--------+-------+----------------+-------------------+             PSV cm/s EDV cms Describe         Arm Pressure (mmHG)  +----------+--------+-------+----------------+-------------------+  Subclavian 2                 Multiphasic, WNL 148                  +----------+--------+-------+----------------+-------------------+ +---------+--------+-+--------+-+---------+  Vertebral PSV cm/s 0 EDV cm/s 0 Antegrade  +---------+--------+-+--------+-+---------+  Left Carotid Findings: +----------+--------+-------+--------+--------------------------------+--------+  PSV cm/s EDV     Stenosis Plaque Description               Comments                       cm/s                                                        +----------+--------+-------+--------+--------------------------------+--------+  CCA Prox   1        0                                                           +----------+--------+-------+--------+--------------------------------+--------+  CCA Distal 1        0                smooth, heterogenous and                                                         calcific                                   +----------+--------+-------+--------+--------------------------------+--------+  ICA Prox   3        1       60-79%   smooth, heterogenous and                                                         calcific                                   +----------+--------+-------+--------+--------------------------------+--------+  ICA Distal 1        0                                                           +----------+--------+-------+--------+--------------------------------+--------+  ECA        1        0                                                           +----------+--------+-------+--------+--------------------------------+--------+ +----------+--------+--------+------------+-------------------+             PSV cm/s EDV cm/s Describe     Arm Pressure (mmHG)  +----------+--------+--------+------------+-------------------+  Subclavian  Not assessed 134                  +----------+--------+--------+------------+-------------------+ +---------+--------+--+--------+--+----------+  Vertebral PSV  cm/s -1 EDV cm/s -0 Retrograde  +---------+--------+--+--------+--+----------+   Summary: Right Carotid: Velocities in the right ICA are consistent with a 1-39% stenosis. Left Carotid: Velocities in the left ICA are consistent with a 60-79% stenosis. Vertebrals:  Right vertebral artery demonstrates antegrade flow. Left vertebral              artery demonstrates retrograde flow. Subclavians: Left subclavian artery was not visualized. Normal flow hemodynamics              were seen in the right subclavian artery. *See table(s) above for measurements and observations.  Electronically signed by Antony Contras MD on 08/02/2019 at 8:47:01 AM.    Final     Assessment and Plan:   1.  New diagnosis of cardiomyopathy -EF is now 35-40%, no wall motion abnormalities. -No signs or symptoms of volume overload -He has been on lisinopril/HCTZ 20/25 mg daily as an outpatient -He would benefit from a beta-blocker, but heart rate is high 50s at times, MD advise if we should try Coreg 3.125 mg twice daily. -MD advise on ischemic eval, patient is agreeable to pursuing this, as an outpatient if possible  2.  CVA: -Per IM, Neuro, he is hoping to go home today.  3.  Hypertension: -He is compliant with his lisinopril HCTZ -However, his creatinine is up slightly  4.  Elevated creatinine -Labs from 2020 show creatinine of 1.25 and early in 2020, it was lower. -Creatinine was 1.32 in March 2021 and 1.30 now -However, his GFR is still greater than 60 because he is African-American -Continue to follow, MD advise if this is a reason to change his blood pressure medication  4. Ectopy -He has frequent PVCs and PACs. -However, his potassium level is currently 4. -Magnesium level today is 2.2 -Continue to monitor, add beta-blocker as heart rate will allow  5.  Hyperlipidemia: -His cholesterol is not at goal on Mevacor 20 mg daily -Because of his stroke and carotid disease, his goal LDL is now less than 70 -Agree with  Lipitor 80 mg daily  6. Non-Hodgkin's lymphoma - was on CHOP in 2016 - now on Ibrutinib, has been on this since 2019, different doses - per Dr Jana Hakim  Otherwise, per IM Active Problems:   Essential hypertension   HLD (hyperlipidemia)   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 [facility])   TIA (transient ischemic attack)   Left sided numbness     For questions or updates, please contact [facility] Please consult [email or web address] for contact info under Cardiology/STEMI.   SignedRosaria Ferries, PA-C  08/03/2019 2:15 PM

## 2019-08-03 NOTE — Discharge Instructions (Signed)
Start entresto on 08/05/19 Take both aspirin and plavix together for 3 weeks, then aspirin alone afterwards

## 2019-08-04 ENCOUNTER — Telehealth: Payer: Self-pay | Admitting: *Deleted

## 2019-08-04 NOTE — Telephone Encounter (Signed)
1st attempt. Unable to reach patient.   

## 2019-08-05 NOTE — Telephone Encounter (Signed)
I have made two attempts and have been unable to reach patient. Pt has hospital follow up scheduled w/ PCP 08/09/19.

## 2019-08-09 ENCOUNTER — Ambulatory Visit (INDEPENDENT_AMBULATORY_CARE_PROVIDER_SITE_OTHER): Payer: Medicare HMO | Admitting: Family Medicine

## 2019-08-09 ENCOUNTER — Encounter: Payer: Self-pay | Admitting: Family Medicine

## 2019-08-09 ENCOUNTER — Other Ambulatory Visit: Payer: Self-pay

## 2019-08-09 VITALS — BP 104/78 | HR 44 | Temp 98.2°F | Ht 74.0 in | Wt 202.0 lb

## 2019-08-09 DIAGNOSIS — Z8673 Personal history of transient ischemic attack (TIA), and cerebral infarction without residual deficits: Secondary | ICD-10-CM | POA: Diagnosis not present

## 2019-08-09 DIAGNOSIS — I4891 Unspecified atrial fibrillation: Secondary | ICD-10-CM

## 2019-08-09 NOTE — Patient Instructions (Addendum)
Activity as tolerated.  No more than 2 beers (12 oz) per sitting.  I do not want you falling.  Spicy food is OK.   Let me know if you need aid with a referral to cardiology or the neurology team.     Let us know if you need anything.

## 2019-08-09 NOTE — Progress Notes (Signed)
Chief Complaint  Patient presents with  . Hospitalization Follow-up    Subjective: Patient is a 69 y.o. male here for hosp f/u. Here w spouse.  Pt was admitted from 6/28-6/30 due to gait imbalance and left-sided paresthesias.  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  He was started on dual antiplatelet therapy with aspirin and Plavix and started on Lipitor 80 mg daily.  He was also found to be in atrial fibrillation.  He has never had this diagnosis before.  He does have a history of hypertension and heart disease in his family.  He has never had any palpitations, chest pain, shortness of breath, or lightheadedness.  This continues.  He is compliant with his medications.  He will schedule a follow-up with both neurology and cardiology.  Past Medical History:  Diagnosis Date  . Anemia   . Cancer ([place])    Lymphoma  . Essential hypertension   . GERD (gastroesophageal reflux disease)   . GI bleed   . HLD (hyperlipidemia)   . Weakness     Objective: BP 104/78 (BP Location: Left Arm, Patient Position: Sitting, Cuff Size: Normal)   Pulse (!) 44   Temp 98.2 F (36.8 C) (Oral)   Ht 6\' 2"  (1.88 m)   Wt 202 lb (91.6 kg)   SpO2 98%   BMI 25.94 kg/m  General: Awake, appears stated age HEENT: MMM, EOMi, PERRLA Heart: Regular rate, irregularly irregular rhythm, no bruits, no lower extremity edema Lungs: CTAB, no rales, wheezes or rhonchi. No accessory muscle use MSK: No atrophy Neuro: 5/5 strength throughout, gait is normal, DTRs equal and symmetric throughout, no clonus Psych: Age appropriate judgment and insight, normal affect and mood  Assessment and Plan: History of CVA (cerebrovascular accident)  Atrial fibrillation, unspecified type ([place])  He appears to have no lingering deficit.  Continue dual antiplatelet therapy for 3 weeks.  Both he and his wife will schedule his follow-up with both cardiology and neurology.  If they have any issues,  they will let me know.  No anticoagulation was started per the cardiology team while inpatient.  He is rate controlled.  Activity as tolerated.  No issues with him consuming spicy food. Unless he needs me, I will see him in 1 year for his annual visit.  I prescribed no medications routinely. The patient and his wife voiced understanding and agreement to the plan.  [place], DO 08/09/19  12:14 PM

## 2019-08-11 ENCOUNTER — Encounter: Payer: Self-pay | Admitting: *Deleted

## 2019-08-11 ENCOUNTER — Ambulatory Visit: Payer: Medicare HMO | Admitting: Cardiology

## 2019-08-11 ENCOUNTER — Other Ambulatory Visit: Payer: Self-pay

## 2019-08-11 ENCOUNTER — Encounter: Payer: Self-pay | Admitting: Cardiology

## 2019-08-11 VITALS — BP 136/74 | HR 67 | Ht 70.0 in | Wt 200.0 lb

## 2019-08-11 DIAGNOSIS — I480 Paroxysmal atrial fibrillation: Secondary | ICD-10-CM

## 2019-08-11 DIAGNOSIS — C8319 Mantle cell lymphoma, extranodal and solid organ sites: Secondary | ICD-10-CM | POA: Diagnosis not present

## 2019-08-11 DIAGNOSIS — I4949 Other premature depolarization: Secondary | ICD-10-CM | POA: Insufficient documentation

## 2019-08-11 DIAGNOSIS — I6329 Cerebral infarction due to unspecified occlusion or stenosis of other precerebral arteries: Secondary | ICD-10-CM | POA: Insufficient documentation

## 2019-08-11 DIAGNOSIS — I639 Cerebral infarction, unspecified: Secondary | ICD-10-CM | POA: Diagnosis not present

## 2019-08-11 DIAGNOSIS — I779 Disorder of arteries and arterioles, unspecified: Secondary | ICD-10-CM | POA: Insufficient documentation

## 2019-08-11 DIAGNOSIS — I6523 Occlusion and stenosis of bilateral carotid arteries: Secondary | ICD-10-CM

## 2019-08-11 DIAGNOSIS — Z01810 Encounter for preprocedural cardiovascular examination: Secondary | ICD-10-CM | POA: Diagnosis not present

## 2019-08-11 DIAGNOSIS — N189 Chronic kidney disease, unspecified: Secondary | ICD-10-CM | POA: Diagnosis not present

## 2019-08-11 DIAGNOSIS — E782 Mixed hyperlipidemia: Secondary | ICD-10-CM

## 2019-08-11 DIAGNOSIS — I6322 Cerebral infarction due to unspecified occlusion or stenosis of basilar arteries: Secondary | ICD-10-CM | POA: Insufficient documentation

## 2019-08-11 DIAGNOSIS — I10 Essential (primary) hypertension: Secondary | ICD-10-CM

## 2019-08-11 DIAGNOSIS — I429 Cardiomyopathy, unspecified: Secondary | ICD-10-CM | POA: Insufficient documentation

## 2019-08-11 LAB — BASIC METABOLIC PANEL
BUN/Creatinine Ratio: 14 (ref 10–24)
BUN: 18 mg/dL (ref 8–27)
CO2: 22 mmol/L (ref 20–29)
Calcium: 9.5 mg/dL (ref 8.6–10.2)
Chloride: 103 mmol/L (ref 96–106)
Creatinine, Ser: 1.26 mg/dL (ref 0.76–1.27)
GFR calc Af Amer: 68 mL/min/{1.73_m2} (ref >59)
GFR calc non Af Amer: 59 mL/min/{1.73_m2} — ABNORMAL LOW (ref >59)
Glucose: 75 mg/dL (ref 65–99)
Potassium: 5 mmol/L (ref 3.5–5.2)
Sodium: 140 mmol/L (ref 134–144)

## 2019-08-11 MED ORDER — CARVEDILOL 3.125 MG PO TABS: 3.1250 mg | ORAL_TABLET | Freq: Two times a day (BID) | ORAL | 3 refills | Status: DC

## 2019-08-11 MED ORDER — METOPROLOL TARTRATE 50 MG PO TABS: ORAL_TABLET | ORAL | 0 refills | Status: DC

## 2019-08-11 MED FILL — IMBRUVICA 420 MG TAB: 420 | 28 days supply | Qty: 28 | Fill #0

## 2019-08-11 NOTE — Assessment & Plan Note (Signed)
Repeat B/P by me 136/66- check BMP and increase Entresto is stable- (SCr was 1.3)

## 2019-08-11 NOTE — Assessment & Plan Note (Signed)
AF was not documented by EKG- check 7 day ZIO

## 2019-08-11 NOTE — Assessment & Plan Note (Signed)
Check coronary CTA to r/o ischemic etiology. No CHF on exam.

## 2019-08-11 NOTE — Progress Notes (Addendum)
Cardiology Office Note:    Date:  08/11/2019   ID:  ELIOT POPPER, DOB Aug 30, 1950, MRN [id number]  PCP:  Shelda Pal, DO  Cardiologist:  Pixie Casino, MD  Electrophysiologist:  None   Referring MD: Shelda Pal*   CC: follow up post hospital  History of Present Illness:    [name] is a pleasant 69 year old male with a history of non-Hodgkin's lymphoma who presented to the emergency room 08/01/2019 with balance issues.  Patient said he had done his usual workout and was doing some work around the house when he noticed his balance was "off".  He also developed some left arm pain.  He had not had this before.  He went to the emergency room.  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  Carotid Dopplers revealed a 1 to 39% right internal carotid artery narrowing and a 60 to 79% left internal carotid artery narrowing.  Echocardiogram revealed cardiomyopathy with an EF of 35 to 40% with global hypokinesis.  He also had grade 1 diastolic dysfunction, moderate left atrial enlargement.  He was noted to have frequent ectopy with PACs and PVCs.  The hospital notes and discharge summary mention atrial fibrillation, this was not mentioned by Dr. Debara Pickett and I could find no evidence of atrial fibrillation by his EKGs.  He did have frequent ectopy.  The patient was placed on aspirin and Plavix although today he tells me he has not been taking aspirin.  His Mevacor was changed to Lipitor 80 mg. His lisinopril HCTZ was changed to Entresto.  Dr. Lysbeth Penner note also mentions adding low-dose carvedilol but this was not added at discharge.  The plan is for outpatient ischemic evaluation with a coronary CT scan.  Since discharge the patient says he is done well.  The patient is an avid exerciser and goes to the gym.  He used to be a runner but now just walks daily for exercise.  I asked him to back off on his weight lifting and push-ups.  I suggested he could do  low weights, 10 pounds or so with high reps.  I suggested he continue walking for exercise as tolerated.  He and his wife have gone on a no sodium diet. He denies any LE edema or orthopnea.     Past Medical History:  Diagnosis Date  . Anemia   . Cancer ([place])    Lymphoma  . Essential hypertension   . GERD (gastroesophageal reflux disease)   . GI bleed   . HLD (hyperlipidemia)   . Weakness     Past Surgical History:  Procedure Laterality Date  . ESOPHAGOGASTRODUODENOSCOPY Left 01/18/2015   Procedure: ESOPHAGOGASTRODUODENOSCOPY (EGD);  Surgeon: Wilford Corner, MD;  Location[facility];  Service: Endoscopy;  Laterality: Left;  . INGUINAL HERNIA REPAIR Right 01/22/2015   Procedure: RIGHT INGUINAL LYMPH NODE BX;  Surgeon: Donnie Mesa, MD;  Location: [place];  Service: General;  Laterality: Right;  . MEDIASTINOSCOPY N/A 05/05/2017   Procedure: MEDIASTINOSCOPY;  Surgeon: Grace Isaac, MD;  Location: [place];  Service: Thoracic;  Laterality: N/A;  . PORTACATH PLACEMENT  01/26/2015    power port with tip SVC/[place]  . SKIN SURGERY     Small benign cysts over left scalp removed  . VIDEO BRONCHOSCOPY WITH ENDOBRONCHIAL ULTRASOUND N/A 05/05/2017   Procedure: VIDEO BRONCHOSCOPY WITH ENDOBRONCHIAL ULTRASOUND;  Surgeon: Grace Isaac, MD;  Location: [place];  Service: Thoracic;  Laterality:  N/A;    Current Medications: Current Meds  Medication Sig  . aspirin 81 MG EC tablet Take 1 tablet (81 mg total) by mouth daily. Swallow whole.  Marland Kitchen atorvastatin (LIPITOR) 80 MG tablet Take 1 tablet (80 mg total) by mouth daily.  . clopidogrel (PLAVIX) 75 MG tablet Take 1 tablet (75 mg total) by mouth daily for 21 days.  . diazepam (VALIUM) 5 MG tablet Take 1 tablet 1 hour prior to scan and repeat if needed  . IMBRUVICA 420 MG TABS TAKE 1 TABLET BY MOUTH DAILY.  . sacubitril-valsartan (ENTRESTO) 24-26 MG Take 1 tablet by mouth 2 (two) times daily.     Allergies:   Patient has no known  allergies.   Social History   Socioeconomic History  . Marital status: Married    Spouse name: Not on file  . Number of children: Not on file  . Years of education: Not on file  . Highest education level: Not on file  Occupational History  . Occupation: Retired  Tobacco Use  . Smoking status: Never Smoker  . Smokeless tobacco: Never Used  Vaping Use  . Vaping Use: Never used  Substance and Sexual Activity  . Alcohol use: Yes    Comment: occasional beer  . Drug use: No  . Sexual activity: Not on file  Other Topics Concern  . Not on file  Social History Narrative  . Not on file   Social Determinants of Health   Financial Resource Strain:   . Difficulty of Paying Living Expenses:   Food Insecurity:   . Worried About Charity fundraiser in the Last Year:   . Arboriculturist in the Last Year:   Transportation Needs:   . Film/video editor (Medical):   Marland Kitchen Lack of Transportation (Non-Medical):   Physical Activity:   . Days of Exercise per Week:   . Minutes of Exercise per Session:   Stress:   . Feeling of Stress :   Social Connections:   . Frequency of Communication with Friends and Family:   . Frequency of Social Gatherings with Friends and Family:   . Attends Religious Services:   . Active Member of Clubs or Organizations:   . Attends Archivist Meetings:   Marland Kitchen Marital Status:      Family History: The patient's family history includes Diabetes in his sister; Hypertension in his father, mother, and sister; Kidney failure in his father; Lupus in his sister; Prostate cancer in his brother.  ROS:   Please see the history of present illness.    All other systems reviewed and are negative.  EKGs/Labs/Other Studies Reviewed:    The following studies were reviewed today: Cath/PCI- April 2021  EKG:  EKG is ordered today.  The ekg ordered today demonstrates NSR, 67  Recent Labs: 08/03/2019: ALT 16; BUN 21; Creatinine, Ser 1.26; Hemoglobin 13.1; Magnesium  2.2; Platelets 280; Potassium 4.0; Sodium 137; TSH 1.924  Recent Lipid Panel    Component Value Date/Time   CHOL 227 (H) 08/01/2019 1218   TRIG 91 08/01/2019 1218   HDL 54 08/01/2019 1218   CHOLHDL 4.2 08/01/2019 1218   VLDL 18 08/01/2019 1218   LDLCALC 155 (H) 08/01/2019 1218    Physical Exam:    VS:  BP 136/74   Pulse 67   Ht _0  (1.778 m)   Wt 200 lb (90.7 kg)   SpO2 98%   BMI 28.70 kg/m     Wt Readings from  Last 3 Encounters:  08/11/19 200 lb (90.7 kg)  08/09/19 202 lb (91.6 kg)  08/03/19 199 lb 11.2 oz (90.6 kg)     GEN: Well nourished, well developed AA male in no acute distress HEENT: Normal NECK: No JVD; No carotid bruits CARDIAC: RRR, no murmurs, rubs, gallops RESPIRATORY:  Clear to auscultation without rales, wheezing or rhonchi  ABDOMEN: Soft, non-tender, non-distended MUSCULOSKELETAL:  No edema; No deformity  SKIN: Warm and dry NEUROLOGIC:  Alert and oriented x 3 PSYCHIATRIC:  Normal affect   ASSESSMENT:    Stroke ([address]) Rt pontine stroke 08/01/2019- (old small Rt frontal stroke noted as well on MRI)  Ectopic beats AF was not documented by EKG- check 7 day ZIO  Cardiomyopathy [facility]) Check coronary CTA to r/o ischemic etiology. No CHF on exam.   Essential hypertension Repeat B/P by me 136/66- check BMP and increase Entresto is stable- (SCr was 1.3)  HLD (hyperlipidemia) Mevacor changed to Lipitor 80 mg.   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  Start Coreg 3.125 mg BID.  Check BMP-increase Delene Loll if renal function is stable. Check 7 day ZIO for PAF.  F/U 4-6 weeks with Dr Debara Pickett or myself in the office. Plan f/u echo in 2-3 months.    Medication Adjustments/Labs and Tests Ordered: Current medicines are reviewed at  length with the patient today.  Concerns regarding medicines are outlined above.  Orders Placed This Encounter  Procedures  . CT CORONARY MORPH W/CTA COR W/SCORE W/CA W/CM &/OR WO/CM  . CT CORONARY FRACTIONAL FLOW RESERVE DATA PREP  . CT CORONARY FRACTIONAL FLOW RESERVE FLUID ANALYSIS  . Basic metabolic panel  . LONG TERM MONITOR (3-14 DAYS)   Meds ordered this encounter  Medications  . metoprolol tartrate (LOPRESSOR) 50 MG tablet    Sig: Take 2 hours prior to procedure    Dispense:  1 tablet    Refill:  0  . carvedilol (COREG) 3.125 MG tablet    Sig: Take 1 tablet (3.125 mg total) by mouth 2 (two) times daily with a meal.    Dispense:  180 tablet    Refill:  3    Patient Instructions  Medication Instructions:   START Aspirin 81 mg daily  START Carvedilol (Coreg) 3.125 mg 2 times a day  *If you need a refill on your cardiac medications before your next appointment, please call your pharmacy*  Lab Work: Your physician recommends that you return for lab work TODAY:   BMET If you have labs (blood work) drawn today and your tests are completely normal, you will receive your results only by: Marland Kitchen MyChart Message (if you have MyChart) OR . A paper copy in the mail If you have any lab test that is abnormal or we need to change your treatment, we will call you to review the results.   Testing/Procedures: Your cardiac CT will be scheduled at one of the below locations:   [facility] [address] [place], [place] [address] [phone]  [place] [address] [place], [place] [address] [phone]  If scheduled at [facility], please arrive at the [facility] main entrance of [facility] 30 minutes prior to test start time.  Proceed to the [facility] Radiology Department (first floor) to check-in and test prep.  If scheduled at [facility], please arrive 15 mins  early for check-in and test prep.  Please follow these instructions carefully (unless otherwise directed):  Hold all erectile dysfunction medications at least 3 days (72 hrs) prior to test.  On the Night Before the Test: . Be sure to Drink plenty of water. . Do not consume any caffeinated/decaffeinated beverages or chocolate 12 hours prior to your test. . Do not take any antihistamines 12 hours prior to your test. . If the patient has contrast allergy: ? Patient will need a prescription for Prednisone and very clear instructions (as follows): 1. Prednisone 50 mg - take 13 hours prior to test 2. Take another Prednisone 50 mg 7 hours prior to test 3. Take another Prednisone 50 mg 1 hour prior to test 4. Take Benadryl 50 mg 1 hour prior to test . Patient must complete all four doses of above prophylactic medications. . Patient will need a ride after test due to Benadryl.  On the Day of the Test: . Drink plenty of water. Do not drink any water within one hour of the test. . Do not eat any food 4 hours prior to the test. . You may take your regular medications prior to the test.  . Take metoprolol (Lopressor) two hours prior to test. . HOLD Furosemide/Hydrochlorothiazide morning of the test. . FEMALES- please wear underwire-free bra if available   *For Clinical Staff only. Please instruct patient the following:*        -Drink plenty of water       -Hold Furosemide/hydrochlorothiazide morning of the test       -Take metoprolol (Lopressor) 2 hours prior to test (if applicable).                  -If HR is less than 55 BPM- No Lopressor                -IF HR is greater than 55 BPM and patient is less than or equal to 68 yrs old Lopressor 138m x1.                -If HR is greater than 55 BPM and patient is greater than 781yrs old Lopressor 50 mg x1.     Do not give Lopressor to patients with an allergy to lopressor or anyone with asthma or active COPD symptoms (currently taking  steroids).       After the Test: . Drink plenty of water. . After receiving IV contrast, you may experience a mild flushed feeling. This is normal. . On occasion, you may experience a mild rash up to 24 hours after the test. This is not dangerous. If this occurs, you can take Benadryl 25 mg and increase your fluid intake. . If you experience trouble breathing, this can be serious. If it is severe call 911 IMMEDIATELY. If it is mild, please call our office. . If you take any of these medications: Glipizide/Metformin, Avandament, Glucavance, please do not take 48 hours after completing test unless otherwise instructed.   Once we have confirmed authorization from your insurance company, we will call you to set up a date and time for your test. Based on how quickly your insurance processes prior authorizations requests, please allow up to 4 weeks to be contacted for scheduling your Cardiac CT appointment. Be advised that routine Cardiac CT appointments could be scheduled  as many as 8 weeks after your provider has ordered it.  For non-scheduling related questions, please contact the cardiac imaging nurse navigator should you have any questions/concerns: Marchia Bond, Cardiac Imaging Nurse Navigator Burley Saver, Interim Cardiac Imaging Nurse [place] [facility] Direct Office Dial: [phone]   For scheduling needs, including cancellations and rescheduling, please call Vivien Rota at [phone], option 3.    ZIO XT- Long Term Monitor Instructions   Your physician has requested you wear your ZIO patch monitor 7 days.   This is a single patch monitor.  [employer] supplies one patch monitor per enrollment.  Additional stickers are not available.   Please do not apply patch if you will be having a Nuclear Stress Test, Echocardiogram, Cardiac CT, MRI, or Chest Xray during the time frame you would be wearing the monitor. The patch cannot be worn during these tests.  You cannot  remove and re-apply the ZIO XT patch monitor.   Your ZIO patch monitor will be sent USPS Priority mail from [facility] directly to your home address. The monitor may also be mailed to a PO BOX if home delivery is not available.   It may take 3-5 days to receive your monitor after you have been enrolled.   Once you have received you monitor, please review enclosed instructions.  Your monitor has already been registered assigning a specific monitor serial # to you.   Applying the monitor   Shave hair from upper left chest.   Hold abrader disc by orange tab.  Rub abrader in 40 strokes over left upper chest as indicated in your monitor instructions.   Clean area with 4 enclosed alcohol pads .  Use all pads to assure are is cleaned thoroughly.  Let dry.   Apply patch as indicated in monitor instructions.  Patch will be place under collarbone on left side of chest with arrow pointing upward.   Rub patch adhesive wings for 2 minutes.Remove white label marked "1".  Remove white label marked "2".  Rub patch adhesive wings for 2 additional minutes.   While looking in a mirror, press and release button in center of patch.  A small green light will flash 3-4 times .  This will be your only indicator the monitor has been turned on.     Do not shower for the first 24 hours.  You may shower after the first 24 hours.   Press button if you feel a symptom. You will hear a small click.  Record Date, Time and Symptom in the Patient Log Book.   When you are ready to remove patch, follow instructions on last 2 pages of Patient Log Book.  Stick patch monitor onto last page of Patient Log Book.   Place Patient Log Book in [place] box.  Use locking tab on box and tape box closed securely.  The [employer] has [employer] on it.  Please place in mailbox as soon as possible.  Your physician should have your test results approximately 7 days after the monitor has been mailed back to [facility].    Call [place] at [phone] if you have questions regarding your ZIO XT patch monitor.  Call them immediately if you see an orange light blinking on your monitor.   If your monitor falls off in less than 4 days contact our Monitor department at [phone].  If your monitor becomes loose or falls off after 4 days call [employer] at [phone]  for suggestions on securing your monitor.   Follow-Up: At [facility], you and your health needs are our priority.  As part of our continuing mission to provide you with exceptional heart care, we have created designated Provider Care Teams.  These Care Teams include your primary Cardiologist (physician) and Advanced Practice Providers (APPs -  Physician Assistants and Nurse Practitioners) who all work together to provide you with the care you need, when you need it.  Your next appointment:   4-6 week(s)  The format for your next appointment:   In Person  Provider:   Raliegh Ip Mali Hilty, MD  Other Instructions      Signed, Kerin Ransom, PA-C  08/11/2019 10:05 AM    [place]

## 2019-08-11 NOTE — Assessment & Plan Note (Signed)
Rt pontine stroke 08/01/2019- (old small Rt frontal stroke noted as well on MRI)

## 2019-08-11 NOTE — Progress Notes (Signed)
Patient ID: [name], male   DOB: 01-17-1951, 69 y.o.   MRN: [id number] Patient enrolled for [employer] to ship a 7 day ZIO XT long term holter monitor to his home.

## 2019-08-11 NOTE — Assessment & Plan Note (Signed)
On chemotherapy- followed by Dr Jana Hakim

## 2019-08-11 NOTE — Assessment & Plan Note (Signed)
75-88% LICA disease- will follow up with dopplers in 6 months (per Dr Debara Pickett) 1-39% RICA disease

## 2019-08-11 NOTE — Patient Instructions (Addendum)
Medication Instructions:   START Aspirin 81 mg daily  START Carvedilol (Coreg) 3.125 mg 2 times a day  *If you need a refill on your cardiac medications before your next appointment, please call your pharmacy*  Lab Work: Your physician recommends that you return for lab work TODAY:   BMET If you have labs (blood work) drawn today and your tests are completely normal, you will receive your results only by: Marland Kitchen MyChart Message (if you have MyChart) OR . A paper copy in the mail If you have any lab test that is abnormal or we need to change your treatment, we will call you to review the results.   Testing/Procedures: Your cardiac CT will be scheduled at one of the below locations:   [facility] [address] [place], [place] [address] [phone]  [facility] [address] [place], [place] [address] [phone]  If scheduled at [facility], please arrive at the [facility] main entrance of [facility] 30 minutes prior to test start time. Proceed to the [facility] Radiology Department (first floor) to check-in and test prep.  If scheduled at [facility], please arrive 15 mins early for check-in and test prep.  Please follow these instructions carefully (unless otherwise directed):  Hold all erectile dysfunction medications at least 3 days (72 hrs) prior to test.  On the Night Before the Test: . Be sure to Drink plenty of water. . Do not consume any caffeinated/decaffeinated beverages or chocolate 12 hours prior to your test. . Do not take any antihistamines 12 hours prior to your test.  On the Day of the Test: . Drink plenty of water. Do not drink any water within one hour of the test. . Do not eat any food 4 hours prior to the test. . You may take your regular medications prior to the test.  . Take metoprolol (Lopressor) two hours prior to test. . HOLD  Furosemide/Hydrochlorothiazide morning of the test.        -Drink plenty of water       -Hold Furosemide/hydrochlorothiazide morning of the test       -Take metoprolol (Lopressor) 2 hours prior to test (if applicable).                  -If HR is less than 55 BPM- No Lopressor                -IF HR is greater than 55 BPM and patient is less than or equal to 9 yrs old Lopressor 158m x1.                -If HR is greater than 55 BPM and patient is greater than 749yrs old Lopressor 50 mg x1.     Do not give Lopressor to patients with an allergy to lopressor or anyone with asthma or active COPD symptoms (currently taking steroids).       After the Test: . Drink plenty of water. . After receiving IV contrast, you may experience a mild flushed feeling. This is normal. . On occasion, you may experience a mild rash up to 24 hours after the test. This is not dangerous. If this occurs, you can take Benadryl 25 mg and increase your fluid intake. . If you experience trouble breathing, this can be serious. If it is severe call 911 IMMEDIATELY. If it is mild, please call our office. . If you  take any of these medications: Glipizide/Metformin, Avandament, Glucavance, please do not take 48 hours after completing test unless otherwise instructed.   Once we have confirmed authorization from your insurance company, we will call you to set up a date and time for your test. Based on how quickly your insurance processes prior authorizations requests, please allow up to 4 weeks to be contacted for scheduling your Cardiac CT appointment. Be advised that routine Cardiac CT appointments could be scheduled as many as 8 weeks after your provider has ordered it.  For non-scheduling related questions, please contact the cardiac imaging nurse navigator should you have any questions/concerns: Marchia Bond, Cardiac Imaging Nurse Navigator [place] Saver, Interim Cardiac Imaging Nurse [place] [facility] V[facility]  Services Direct Office Dial: [phone]   For scheduling needs, including cancellations and rescheduling, please call Vivien Rota at [phone], option 3.    ZIO XT- Long Term Monitor Instructions   Your physician has requested you wear your ZIO patch monitor 7 days.   This is a single patch monitor.  [employer] supplies one patch monitor per enrollment.  Additional stickers are not available.   Please do not apply patch if you will be having a Nuclear Stress Test, Echocardiogram, Cardiac CT, MRI, or Chest Xray during the time frame you would be wearing the monitor. The patch cannot be worn during these tests.  You cannot remove and re-apply the ZIO XT patch monitor.   Your ZIO patch monitor will be sent USPS Priority mail from [facility] directly to your home address. The monitor may also be mailed to a PO BOX if home delivery is not available.   It may take 3-5 days to receive your monitor after you have been enrolled.   Once you have received you monitor, please review enclosed instructions.  Your monitor has already been registered assigning a specific monitor serial # to you.   Applying the monitor   Shave hair from upper left chest.   Hold abrader disc by orange tab.  Rub abrader in 40 strokes over left upper chest as indicated in your monitor instructions.   Clean area with 4 enclosed alcohol pads .  Use all pads to assure are is cleaned thoroughly.  Let dry.   Apply patch as indicated in monitor instructions.  Patch will be place under collarbone on left side of chest with arrow pointing upward.   Rub patch adhesive wings for 2 minutes.Remove white label marked "1".  Remove white label marked "2".  Rub patch adhesive wings for 2 additional minutes.   While looking in a mirror, press and release button in center of patch.  A small green light will flash 3-4 times .  This will be your only indicator the monitor has been turned on.     Do not shower for the first 24 hours.   You may shower after the first 24 hours.   Press button if you feel a symptom. You will hear a small click.  Record Date, Time and Symptom in the Patient Log Book.   When you are ready to remove patch, follow instructions on last 2 pages of Patient Log Book.  Stick patch monitor onto last page of Patient Log Book.   Place Patient Log Book in [place] box.  Use locking tab on box and tape box closed securely.  The [employer] has [employer] on it.  Please place in mailbox as soon as possible.  Your physician should have your test  results approximately 7 days after the monitor has been mailed back to [facility].   Call [place] at [phone] if you have questions regarding your ZIO XT patch monitor.  Call them immediately if you see an orange light blinking on your monitor.   If your monitor falls off in less than 4 days contact our Monitor department at [phone].  If your monitor becomes loose or falls off after 4 days call [employer] at [phone] for suggestions on securing your monitor.   Follow-Up: At [facility], you and your health needs are our priority.  As part of our continuing mission to provide you with exceptional heart care, we have created designated Provider Care Teams.  These Care Teams include your primary Cardiologist (physician) and Advanced Practice Providers (APPs -  Physician Assistants and Nurse Practitioners) who all work together to provide you with the care you need, when you need it.  Your next appointment:   4-6 week(s)  The format for your next appointment:   In Person  Provider:   K. Mali Hilty, MD  Other Instructions

## 2019-08-11 NOTE — Assessment & Plan Note (Signed)
Mevacor changed to Lipitor 80 mg.

## 2019-08-15 ENCOUNTER — Ambulatory Visit (INDEPENDENT_AMBULATORY_CARE_PROVIDER_SITE_OTHER): Payer: Medicare HMO

## 2019-08-15 DIAGNOSIS — I480 Paroxysmal atrial fibrillation: Secondary | ICD-10-CM

## 2019-08-19 ENCOUNTER — Telehealth: Payer: Self-pay | Admitting: Cardiology

## 2019-08-19 ENCOUNTER — Telehealth: Payer: Self-pay | Admitting: Family Medicine

## 2019-08-19 NOTE — Telephone Encounter (Signed)
Advised wife, ok per DPR patient only needs to be on ASA 81 mg daily

## 2019-08-19 NOTE — Telephone Encounter (Signed)
Caller:: [name] Call back phone number: [phone]  Wondering if patient needs  to take both aspirin 81 mg. [name] stated one is a prescription the other one is over the counter.

## 2019-08-19 NOTE — Telephone Encounter (Signed)
A single baby aspirin with the Plavix is sufficient. Ty.

## 2019-08-19 NOTE — Telephone Encounter (Signed)
Called informed of PCP instructions. 

## 2019-08-19 NOTE — Telephone Encounter (Signed)
Pt c/o medication issue:  1. Name of Medication: aspirin 81 MG EC tablet  2. How are you currently taking this medication (dosage and times per day)? 2 tablets daily   3. Are you having a reaction (difficulty breathing--STAT)? no  4. What is your medication issue? Patient's wife states the patient was given a prescription for aspirin, but was also told to get an over the counter aspirin. Patient's wife states she did not realize he was taking both aspirins. She states they are both 81 mg. She would like to know if he needs to take both of them.

## 2019-08-24 ENCOUNTER — Other Ambulatory Visit: Payer: Self-pay | Admitting: Oncology

## 2019-08-25 ENCOUNTER — Other Ambulatory Visit: Payer: Self-pay | Admitting: Oncology

## 2019-08-30 ENCOUNTER — Telehealth: Payer: Self-pay | Admitting: Family Medicine

## 2019-08-30 MED ORDER — ATORVASTATIN CALCIUM 80 MG PO TABS: 80.0000 mg | ORAL_TABLET | Freq: Every day | ORAL | 5 refills | Status: DC

## 2019-08-30 NOTE — Telephone Encounter (Signed)
Refill done.  

## 2019-08-30 NOTE — Telephone Encounter (Signed)
Medication: atorvastatin (LIPITOR) 80 MG tablet [[id number]]    Has the patient contacted their pharmacy? No. (If no, request that the patient contact the pharmacy for the refill.) (If yes, when and what did the pharmacy advise?)  Preferred Pharmacy (with phone number or street name):  [id number] - [place], [place] Phone:  [phone]  Fax:  [phone]       Agent: Please be advised that RX refills may take up to 3 business days. We ask that you follow-up with your pharmacy.

## 2019-09-07 ENCOUNTER — Encounter: Payer: Self-pay | Admitting: Adult Health

## 2019-09-07 ENCOUNTER — Telehealth (HOSPITAL_COMMUNITY): Payer: Self-pay | Admitting: *Deleted

## 2019-09-07 ENCOUNTER — Ambulatory Visit: Payer: Medicare HMO | Admitting: Adult Health

## 2019-09-07 ENCOUNTER — Telehealth: Payer: Self-pay | Admitting: Family Medicine

## 2019-09-07 VITALS — BP 137/86 | HR 63 | Ht 70.0 in | Wt 206.0 lb

## 2019-09-07 DIAGNOSIS — I480 Paroxysmal atrial fibrillation: Secondary | ICD-10-CM | POA: Diagnosis not present

## 2019-09-07 DIAGNOSIS — I10 Essential (primary) hypertension: Secondary | ICD-10-CM

## 2019-09-07 DIAGNOSIS — I6522 Occlusion and stenosis of left carotid artery: Secondary | ICD-10-CM | POA: Diagnosis not present

## 2019-09-07 DIAGNOSIS — I6350 Cerebral infarction due to unspecified occlusion or stenosis of unspecified cerebral artery: Secondary | ICD-10-CM | POA: Diagnosis not present

## 2019-09-07 DIAGNOSIS — E785 Hyperlipidemia, unspecified: Secondary | ICD-10-CM | POA: Diagnosis not present

## 2019-09-07 NOTE — Telephone Encounter (Signed)
Attempted to call patient regarding upcoming cardiac CT appointment. Left message on voicemail with name and callback number  Zurich Carreno Tai RN Navigator Cardiac Imaging  Heart and Vascular Services [phone] Office [phone] Cell  

## 2019-09-07 NOTE — Telephone Encounter (Signed)
Received PA approval through [facility] for Atorvastatin 80 mg through 02/03/2020.

## 2019-09-07 NOTE — Patient Instructions (Signed)
Continue aspirin 81 mg daily  and atorvastatin 80 mg daily for secondary stroke prevention  Continue to follow cardiology for ongoing cardiac evaluation as well as monitoring of carotid stenosis  Continue to follow up with PCP regarding cholesterol and blood pressure management  Maintain strict control of hypertension with blood pressure goal below 130/90, diabetes with hemoglobin A1c goal below 6.5% and cholesterol with LDL cholesterol (bad cholesterol) goal below 70 mg/dL.       Followup in the future with me in 4 months or call earlier if needed       Thank you for coming to see [place] at [facility]. I hope we have been able to provide you high quality care today.  You may receive a patient satisfaction survey over the next few weeks. We would appreciate your feedback and comments so that we may continue to improve ourselves and the health of our patients.

## 2019-09-07 NOTE — Progress Notes (Signed)
[facility] [address] [place] [address] [phone]       HOSPITAL FOLLOW UP NOTE  Mr. AKON REINOSO Date of Birth:  1950-06-01 Medical Record Number:  [id number]   Reason for Referral:  hospital stroke follow up    SUBJECTIVE:   CHIEF COMPLAINT:  No chief complaint on file.   HPI:   Mr. REHAN HOLNESS is a 69 y.o. male with history of HTN, HLD, lymphoma on chemo  who presented on 08/01/2019 with left sided numbness and imbalance.  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  Also showed evidence of old right frontal lobe infarct and few scattered old bilateral cerebellar infarcts.  Recommended DAPT for 3 weeks and aspirin alone.  Cardiomyopathy with EF 35 to 40% and recommended follow-up with cardiology for further evaluation.  Carotid stenosis with left ICA 60 to 79% stenosis currently asymptomatic and recommended vascular surgery referral for outpatient follow-up.  Reported PAF but per cardiology no, frequent PVCs and PACs.  HTN stable.  LDL 155 and discontinued Mevacor and initiate atorvastatin 80 mg daily.  Other stroke risk factors include advanced age and history of stroke on imaging.  Other active problems include lymphoma on chemotherapy.  No therapy needed at discharge and discharged home in stable condition.  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 Chronic superior L cerebellar infarct. Small vessel disease. Atrophy. ASPECTS 10.     MRI  R dorsal pontine infarct. Old R frontal lobe infarct. Few scattered old B cerebellar infarcts.  MRA  Unremarkable   Carotid Doppler  L ICA 60-79% (incidental). L VA retrograde flow.  2D Echo EF 35-40%. LA moderately dilated. No source of embolus   LDL 155  HgbA1c 6.1  UDS neg  SCDs for VTE prophylaxis  No antithrombotic prior to admission, now on aspirin 81  mg daily and clopidogrel 75 mg daily. Continue DAPT x 3 weeks then aspirin alone  Therapy recommendations:  none  Disposition:  home  Today, 09/07/2019, Mr. Walmer is being seen for hospital follow-up accompanied by his wife.  He has recovered well without residual deficits and denies new stroke/TIA symptoms.  Wife does report mild cognitive impairment which has been ongoing " for many years".  Completed 3 weeks DAPT and remains on aspirin alone without bleeding or bruising.  Continues on atorvastatin 80 mg daily without myalgias.  Blood pressure today 137/86.  Recent follow-up with cardiology who recommended cardiac monitor for 7 days for possible atrial fibrillation which has been completed and currently awaiting results.  Scheduled for additional cardiac evaluation on Friday.  No concerns at this time.     ROS:   14 system review of systems performed and negative with exception of no complaints  PMH:  Past Medical History:  Diagnosis Date  . Anemia   . Cancer ([place])    Lymphoma  . Essential hypertension   . GERD (gastroesophageal reflux disease)   . GI bleed   . HLD (hyperlipidemia)   . Weakness     PSH:  Past Surgical History:  Procedure Laterality Date  . ESOPHAGOGASTRODUODENOSCOPY Left 01/18/2015   Procedure: ESOPHAGOGASTRODUODENOSCOPY (EGD);  Surgeon: Wilford Corner, MD;  Location[facility] END[facility]COPY;  Service: Endoscopy;  Laterality: Left;  . INGUINAL HERNIA REPAIR Right 01/22/2015   Procedure: RIGHT INGUINAL LYMPH NODE BX;  Surgeon: Donnie Mesa, MD;  Location: [facility];  Service: General;  Laterality: Right;  . MEDIASTINOSCOPY N/A 05/05/2017   Procedure: MEDIASTINOSCOPY;  Surgeon: Grace Isaac, MD;  Location: [facility];  Service: Thoracic;  Laterality: N/A;  . PORTACATH PLACEMENT  01/26/2015    power port with tip SVC/[place]  . SKIN SURGERY     Small benign cysts over left scalp removed  . VIDEO BRONCHOSCOPY WITH ENDOBRONCHIAL ULTRASOUND N/A 05/05/2017   Procedure: VIDEO  BRONCHOSCOPY WITH ENDOBRONCHIAL ULTRASOUND;  Surgeon: Grace Isaac, MD;  Location: [facility] OR;  Service: Thoracic;  Laterality: N/A;    Social History:  Social History   Socioeconomic History  . Marital status: Married    Spouse name: Not on file  . Number of children: Not on file  . Years of education: Not on file  . Highest education level: Not on file  Occupational History  . Occupation: Retired  Tobacco Use  . Smoking status: Never Smoker  . Smokeless tobacco: Never Used  Vaping Use  . Vaping Use: Never used  Substance and Sexual Activity  . Alcohol use: Yes    Comment: occasional beer  . Drug use: No  . Sexual activity: Not on file  Other Topics Concern  . Not on file  Social History Narrative  . Not on file   Social Determinants of Health   Financial Resource Strain:   . Difficulty of Paying Living Expenses:   Food Insecurity:   . Worried About Charity fundraiser in the Last Year:   . Arboriculturist in the Last Year:   Transportation Needs:   . Film/video editor (Medical):   Marland Kitchen Lack of Transportation (Non-Medical):   Physical Activity:   . Days of Exercise per Week:   . Minutes of Exercise per Session:   Stress:   . Feeling of Stress :   Social Connections:   . Frequency of Communication with Friends and Family:   . Frequency of Social Gatherings with Friends and Family:   . Attends Religious Services:   . Active Member of Clubs or Organizations:   . Attends Archivist Meetings:   Marland Kitchen Marital Status:   Intimate Partner Violence:   . Fear of Current or Ex-Partner:   . Emotionally Abused:   Marland Kitchen Physically Abused:   . Sexually Abused:     Family History:  Family History  Problem Relation Age of Onset  . Hypertension Mother   . Hypertension Father   . Kidney failure Father   . Hypertension Sister   . Diabetes Sister   . Prostate cancer Brother   . Lupus Sister     Medications:   Current Outpatient Medications on File Prior to Visit   Medication Sig Dispense Refill  . aspirin 81 MG EC tablet Take 1 tablet (81 mg total) by mouth daily. Swallow whole. 30 tablet 11  . atorvastatin (LIPITOR) 80 MG tablet Take 1 tablet (80 mg total) by mouth daily. 30 tablet 5  . carvedilol (COREG) 3.125 MG tablet Take 1 tablet (3.125 mg total) by mouth 2 (two) times daily with a meal. 180 tablet 3  . IMBRUVICA 420 MG TABS TAKE 1 TABLET BY MOUTH DAILY. 28 tablet 0  . metoprolol tartrate (LOPRESSOR) 50 MG tablet Take 2 hours prior to procedure 1 tablet 0   No current facility-administered medications on file prior to visit.    Allergies:  No Known Allergies    OBJECTIVE:  Physical Exam  Vitals:   09/07/19 1116  BP: 137/86  Pulse: 63  Weight: 206 lb (93.4 kg)  Height: 5\' 10"  (1.778 m)   Body mass index is 29.56 kg/m. No exam data present  General: well developed, well nourished,  pleasant middle-age African-American male, seated, in no evident distress Head: head normocephalic and atraumatic.   Neck: supple with no carotid or supraclavicular bruits Cardiovascular: regular rate and rhythm, no murmurs Musculoskeletal: no deformity Skin:  no rash/petichiae Vascular:  Normal pulses all extremities   Neurologic Exam Mental Status: Awake and fully alert.   Fluent speech and language.  Oriented to place and time. Recent and remote memory intact. Attention span, concentration and fund of knowledge appropriate. Mood and affect appropriate.  Cranial Nerves: Fundoscopic exam reveals sharp disc margins. Pupils equal, briskly reactive to light. Extraocular movements full without nystagmus. Visual fields full to confrontation. Hearing intact. Facial sensation intact. Face, tongue, palate moves normally and symmetrically.  Motor: Normal bulk and tone. Normal strength in all tested extremity muscles. Sensory.: intact to touch , pinprick , position and vibratory sensation.  Coordination: Rapid alternating movements normal in all extremities.  Finger-to-nose and heel-to-shin performed accurately bilaterally. Gait and Station: Arises from chair without difficulty. Stance is normal. Gait demonstrates normal stride length and balance without assistive device.  Able to tandem walk without difficulty.  Romberg negative. Reflexes: 1+ and symmetric. Toes downgoing.     NIHSS  0 Modified Rankin  0     ASSESSMENT: [name] is a 69 y.o. 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 Vascular risk factors include HTN, HLD, cardiomyopathy, carotid stenosis, prior strokes on imaging and advanced age.  Reported atrial fibrillation during admission with recent follow-up by cardiology and recently completed cardiac monitor for further assessment.    PLAN:  1. Right pontine stroke:  -No residual deficit.   -Continue aspirin 81 mg daily  and atorvastatin 80 mg daily for secondary stroke prevention.  -Close PCP follow up for aggressive stroke risk factor management  2. HTN: BP goal <130/90.  Stable today.  Continue f/u with PCP 3. HLD: LDL goal <70. Recent LDL 155.  Continue atorvastatin 80 mg daily.  F/u with PCP for repeat lipid panel and ongoing monitoring and management as well as prescribing of statin 4. Cardiomyopathy: Ongoing follow-up with cardiology for further evaluation 5. Carotid stenosis: Left ICA 60 to 79% stenosis.  Asymptomatic.  Follow with cardiology for surveillance monitoring     Follow up in 4 months or call earlier if needed   I spent 45 minutes of face-to-face and non-face-to-face time with patient and wife.  This included previsit chart review, lab review, study review, order entry, electronic health record documentation, patient education regarding recent stroke, importance of managing stroke risk factors and answered all questions to patient satisfaction     Frann Rider,  [facility]  [facility] [address] [place], [place] [address]  Phone [phone] Fax [phone] Note: This document was prepared with digital dictation and possible smart phrase technology. Any transcriptional errors that result from this process are unintentional.

## 2019-09-08 ENCOUNTER — Other Ambulatory Visit: Payer: Self-pay | Admitting: Oncology

## 2019-09-08 ENCOUNTER — Telehealth (HOSPITAL_COMMUNITY): Payer: Self-pay | Admitting: *Deleted

## 2019-09-08 NOTE — Telephone Encounter (Signed)
Reaching out to patient to offer assistance regarding upcoming cardiac imaging study; pt verbalizes understanding of appt date/time, parking situation and where to check in, pre-test NPO status and medications ordered, and verified current allergies; name and call back number provided for further questions should they arise  Kassidie Hendriks Tai RN Navigator Cardiac Imaging [place] [facility] [phone] office [phone] cell 

## 2019-09-09 ENCOUNTER — Ambulatory Visit (HOSPITAL_COMMUNITY)
Admission: RE | Admit: 2019-09-09 | Discharge: 2019-09-09 | Disposition: A | Discharge status: Home / Self Care | Payer: Medicare HMO | Source: Ambulatory Visit | Attending: Cardiology | Admitting: Cardiology

## 2019-09-09 ENCOUNTER — Other Ambulatory Visit: Payer: Self-pay

## 2019-09-09 DIAGNOSIS — I429 Cardiomyopathy, unspecified: Secondary | ICD-10-CM | POA: Diagnosis not present

## 2019-09-09 IMAGING — CT CT HEART MORP W/ CTA COR W/ SCORE W/ CA W/CM &/OR W/O CM
1 of 8 series · 9 of 20 positions shown, 12 images · IV contrast (APPLIED)
Comparison: PET scan on [DATE]
COMPARISON: PET scan on [DATE]

Addendum:
EXAM:
OVER-READ INTERPRETATION  CT CHEST

The following report is an over-read performed by radiologist Dr.
over-read does not include interpretation of cardiac or coronary
anatomy or pathology. The coronary CTA interpretation by the
cardiologist is attached.
CLINICAL DATA: 68M with non-Hodgkin's lymphoma, carotid stenosis
and stroke with cardiomyopathy.
Cardiac/Coronary  CT
TECHNIQUE: The patient was scanned on a [employer] Force scanner.

[Series 8: multiphase · axial · 0.40mm/px · z∈[-205,-99]mm · 9 of 1768 slices shown, 12 images]
[im 177/1768  vessel]
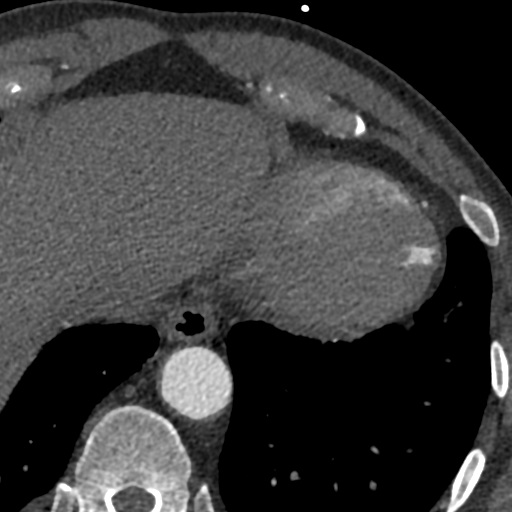
[im 177/1768  lung]
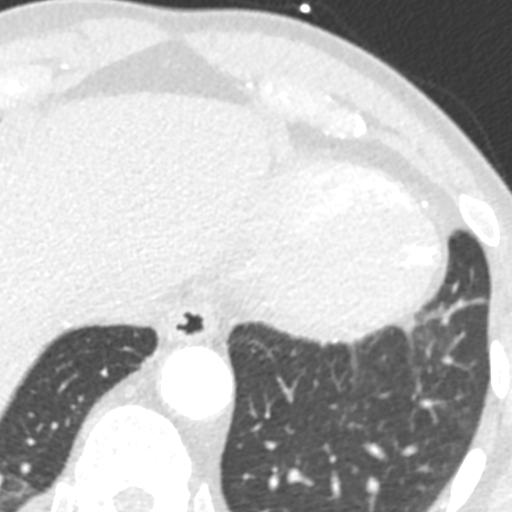
[im 354/1768  vessel]
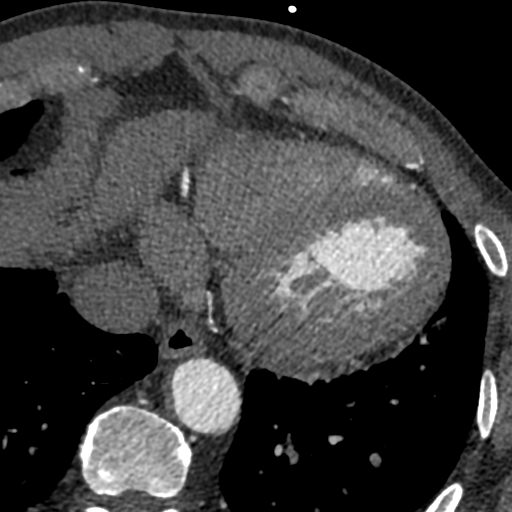
[im 531/1768  vessel]
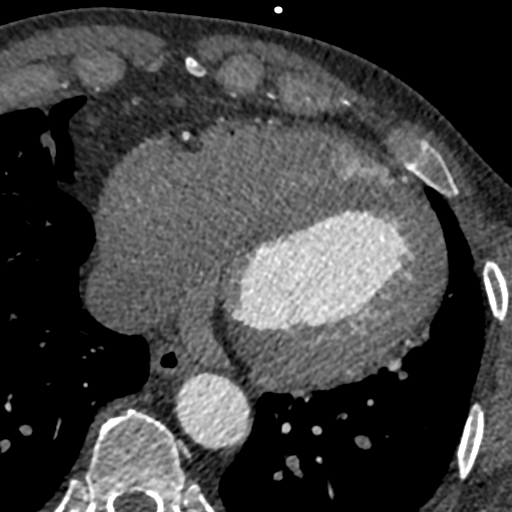
[im 707/1768  vessel]
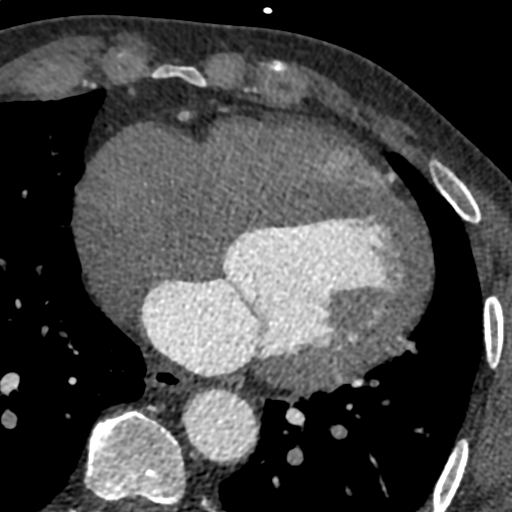
[im 884/1768  vessel]
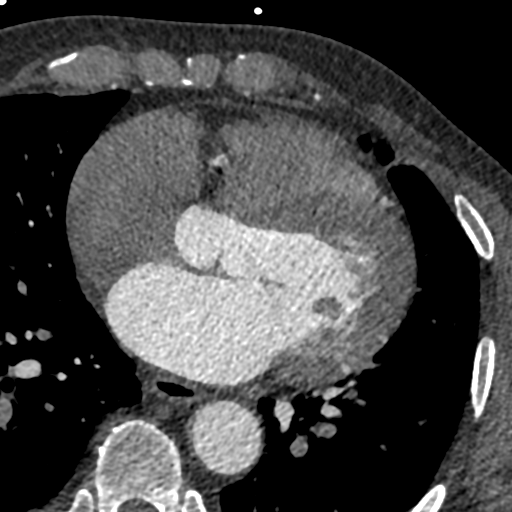
[im 884/1768  lung]
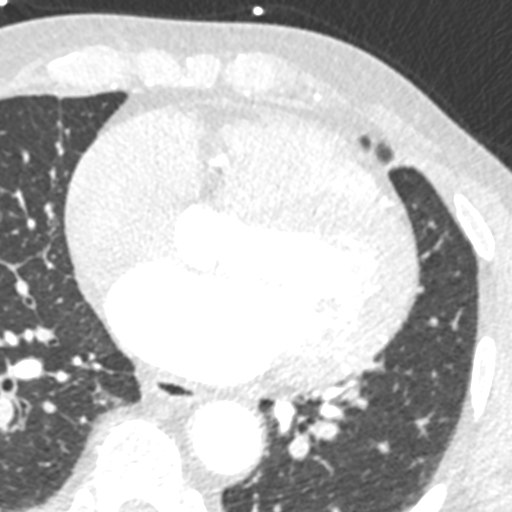
[im 1061/1768  vessel]
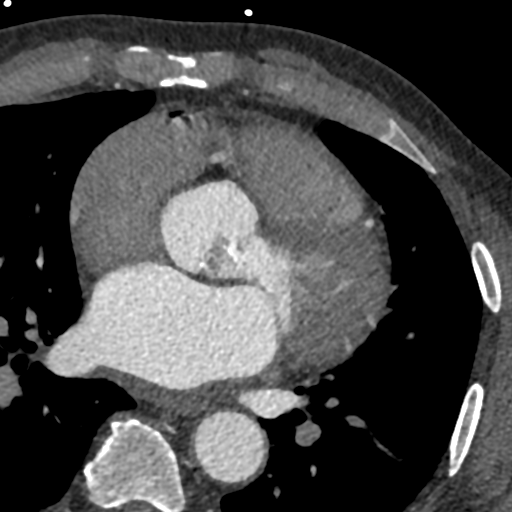
[im 1237/1768  vessel]
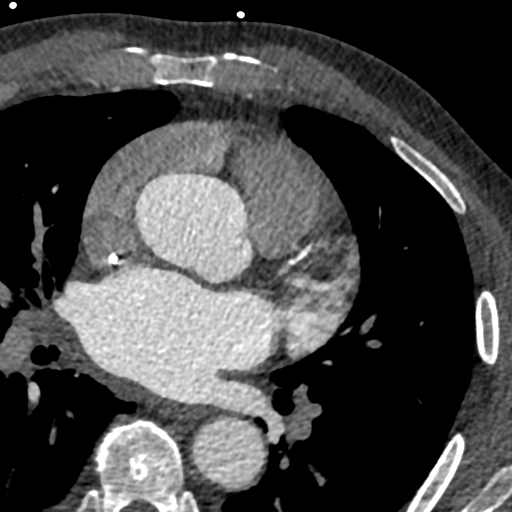
[im 1414/1768  vessel]
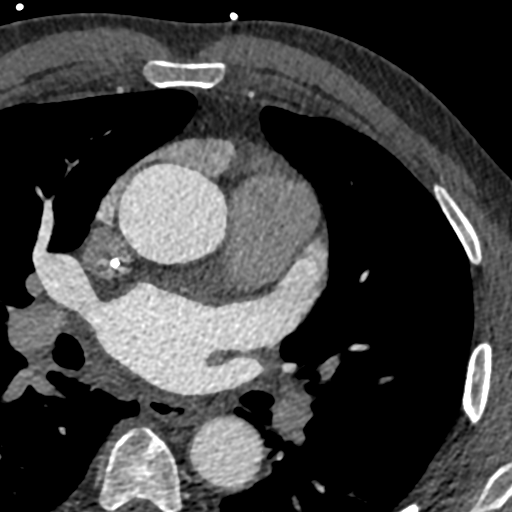
[im 1591/1768  vessel]
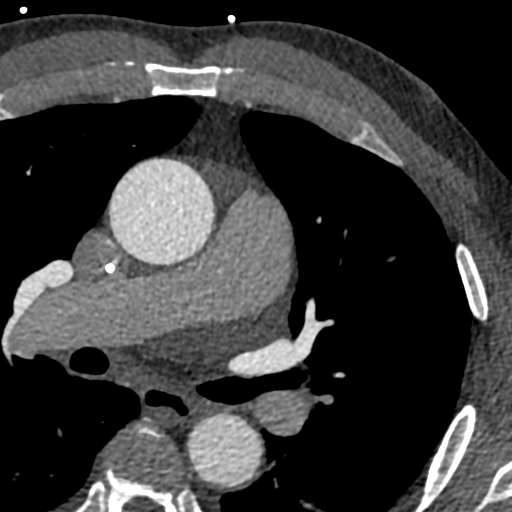
[im 1591/1768  lung]
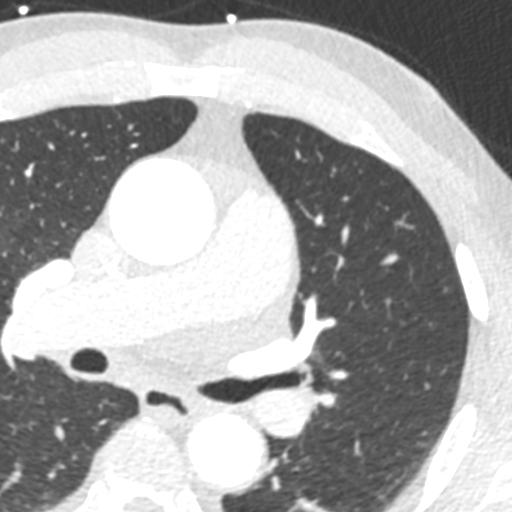

[9 of 20 positions shown; findings below may reference images not displayed]

FINDINGS: Vascular: The visualized ascending thoracic aorta is mildly dilated
measuring up to 4.1 cm. The aortic valve demonstrates thickening and
calcification. The aortic root measures approximately 3.8-4.0 cm.

Mediastinum/Nodes: No visualized lymphadenopathy or masses.

Lungs/Pleura: Trace amount of right pleural fluid. New nodularity
with associated interstitial thickening and geographic ground-glass
airspace opacity in the inferior right lower lobe predominantly in
the lateral and anterior subpleural region. Primary differential is
some type of pneumonia. However, given history of lymphoma, atypical
pattern of pulmonary lymphoma is not excluded and close follow-up is
recommended.

Upper Abdomen: No acute abnormality.

Musculoskeletal: No chest wall mass or suspicious bone lesions
identified.
IMPRESSION: 1. New nodularity with associated interstitial thickening and
geographic ground-glass airspace opacity in the inferior right lower
lobe. Primary differential is some type of pneumonia. However, given
history of lymphoma, atypical pattern of pulmonary lymphoma is not
excluded and close follow-up is recommended.
2. Mildly dilated ascending thoracic aorta measuring up to 4.1 cm.
Consider correlation with a full CTA of the chest with contrast.
Recommend annual imaging followup by CTA or MRA. This recommendation
follows [XX] ACCF/AHA/AATS/ACR/ASA/SCA/CARMELINA/CARMELINA/CARMELINA/CARMELINA Guidelines
for the Diagnosis and Management of Patients with Thoracic Aortic
Disease. Circulation. [XX]; 121: [id number]. Aortic aneurysm NOS
([XX]-[XX])
3. Thickened and calcified aortic valve. Consider correlation with
echocardiography.
4. Trace amount of right pleural fluid.
FINDINGS: A 120 kV retrospective scan was triggered in the descending thoracic
aorta at 111 HU's. Axial non-contrast 3 mm slices were carried out
through the heart. The data set was analyzed on a dedicated work
station and scored using the Agatson method. Gantry rotation speed
was 250 msecs and collimation was .6 mm. No beta blockade and 0.8 mg
of sl NTG was given. The 3D data set was reconstructed in 5%
intervals of the 67-82 % of the R-R cycle. Diastolic phases were
analyzed on a dedicated work station using MPR, MIP and VRT modes.
The patient received 80 cc of contrast.

Aorta: Normal size. Mild ascending aorta aneurysm. Ascending aorta
4.3 cm. No calcifications. No dissection.

Aortic Valve:  Trileaflet.  Mild calcification.

Coronary Arteries:  Normal coronary origin.  Right dominance.

RCA is a dominant artery that gives rise to PDA and PLVB. There is
no mild (25-49%) calcified plaque in the mid RCA. There is calcified
plaque in the PDA. The vessel is too small to determine stenosis
severity in this region.

Left main is a large artery that gives rise to LAD and LCX arteries.

LAD is a large vessel that has minimal (<25%) mixed plaque in the
proximal LAD.

LCX is a non-dominant artery that gives rise to one large OM1
branch. There is minimal (<25%) calcified plaque distally. There is
minimal (<25%) calcified plaque in the mid LCX.

Other findings:

Normal pulmonary vein drainage into the left atrium.

Normal let atrial appendage without a thrombus.

Normal size of the pulmonary artery.

There is a catheter in the SVC terminating at the right atrium.
IMPRESSION: 1. Coronary calcium score of 145. This was 78th percentile for age
and sex matched control.

2. Normal coronary origin with right dominance.

3. Mild plaque in the RCA and minimal plaque in the LAD and LCX.
[XX].

4.  Recommend aggressive risk factor modification.

5.  Mild ascending aorta aneurysm (4.3 cm).

6.  Mild calcification of the aortic valve.

*** End of Addendum ***
EXAM:
OVER-READ INTERPRETATION  CT CHEST

The following report is an over-read performed by radiologist Dr.
over-read does not include interpretation of cardiac or coronary
anatomy or pathology. The coronary CTA interpretation by the
cardiologist is attached.
FINDINGS: Vascular: The visualized ascending thoracic aorta is mildly dilated
measuring up to 4.1 cm. The aortic valve demonstrates thickening and
calcification. The aortic root measures approximately 3.8-4.0 cm.

Mediastinum/Nodes: No visualized lymphadenopathy or masses.

Lungs/Pleura: Trace amount of right pleural fluid. New nodularity
with associated interstitial thickening and geographic ground-glass
airspace opacity in the inferior right lower lobe predominantly in
the lateral and anterior subpleural region. Primary differential is
some type of pneumonia. However, given history of lymphoma, atypical
pattern of pulmonary lymphoma is not excluded and close follow-up is
recommended.

Upper Abdomen: No acute abnormality.

Musculoskeletal: No chest wall mass or suspicious bone lesions
identified.
IMPRESSION: 1. New nodularity with associated interstitial thickening and
geographic ground-glass airspace opacity in the inferior right lower
lobe. Primary differential is some type of pneumonia. However, given
history of lymphoma, atypical pattern of pulmonary lymphoma is not
excluded and close follow-up is recommended.
2. Mildly dilated ascending thoracic aorta measuring up to 4.1 cm.
Consider correlation with a full CTA of the chest with contrast.
Recommend annual imaging followup by CTA or MRA. This recommendation
follows [XX] ACCF/AHA/AATS/ACR/ASA/SCA/CARMELINA/CARMELINA/CARMELINA/CARMELINA Guidelines
for the Diagnosis and Management of Patients with Thoracic Aortic
Disease. Circulation. [XX]; 121: [id number]. Aortic aneurysm NOS
([XX]-[XX])
3. Thickened and calcified aortic valve. Consider correlation with
echocardiography.
4. Trace amount of right pleural fluid.

## 2019-09-09 MED ORDER — NITROGLYCERIN 0.4 MG SL SUBL
0.8000 mg | SUBLINGUAL_TABLET | Freq: Once | SUBLINGUAL | Status: AC
  Administered 2019-09-09: 0.8 mg via SUBLINGUAL

## 2019-09-09 MED ORDER — NITROGLYCERIN 0.4 MG SL SUBL
SUBLINGUAL_TABLET | SUBLINGUAL | Status: AC
  Filled 2019-09-09: qty 2

## 2019-09-09 MED ORDER — IOHEXOL 350 MG/ML SOLN
80.0000 mL | Freq: Once | INTRAVENOUS | Status: AC | PRN
  Administered 2019-09-09: 80 mL via INTRAVENOUS

## 2019-09-09 MED FILL — IMBRUVICA 420 MG TAB: 420 | 28 days supply | Qty: 28 | Fill #0

## 2019-09-29 ENCOUNTER — Encounter: Payer: Self-pay | Admitting: Internal Medicine

## 2019-09-29 ENCOUNTER — Ambulatory Visit: Payer: Medicare HMO | Admitting: Internal Medicine

## 2019-09-29 ENCOUNTER — Other Ambulatory Visit: Payer: Self-pay

## 2019-09-29 VITALS — BP 156/92 | HR 55 | Ht 70.0 in | Wt 208.0 lb

## 2019-09-29 DIAGNOSIS — I6523 Occlusion and stenosis of bilateral carotid arteries: Secondary | ICD-10-CM | POA: Diagnosis not present

## 2019-09-29 DIAGNOSIS — I639 Cerebral infarction, unspecified: Secondary | ICD-10-CM | POA: Diagnosis not present

## 2019-09-29 DIAGNOSIS — I428 Other cardiomyopathies: Secondary | ICD-10-CM

## 2019-09-29 DIAGNOSIS — I10 Essential (primary) hypertension: Secondary | ICD-10-CM

## 2019-09-29 DIAGNOSIS — E782 Mixed hyperlipidemia: Secondary | ICD-10-CM | POA: Diagnosis not present

## 2019-09-29 MED ORDER — SACUBITRIL-VALSARTAN 24-26 MG PO TABS: 1.0000 | ORAL_TABLET | Freq: Two times a day (BID) | ORAL | 11 refills | Status: DC

## 2019-09-29 NOTE — Patient Instructions (Addendum)
Medication Instructions:  START entresto 24/26mg  twice daily CONTINUE all other current medications  *If you need a refill on your cardiac medications before your next appointment, please call your pharmacy*   Lab Work: CMET and LIPID PANEL in 2 weeks If you have labs (blood work) drawn today and your tests are completely normal, you will receive your results only by: Marland Kitchen MyChart Message (if you have MyChart) OR . A paper copy in the mail If you have any lab test that is abnormal or we need to change your treatment, we will call you to review the results.   Follow-Up: At [facility], you and your health needs are our priority.  As part of our continuing mission to provide you with exceptional heart care, we have created designated Provider Care Teams.  These Care Teams include your primary Cardiologist (physician) and Advanced Practice Providers (APPs -  Physician Assistants and Nurse Practitioners) who all work together to provide you with the care you need, when you need it.  We recommend signing up for the patient portal called "MyChart".  Sign up information is provided on this After Visit Summary.  MyChart is used to connect with patients for Virtual Visits (Telemedicine).  Patients are able to view lab/test results, encounter notes, upcoming appointments, etc.  Non-urgent messages can be sent to your provider as well.   To learn more about what you can do with MyChart, go to [email or web address].    Your next appointment:   3 month(s)  The format for your next appointment:   In Person  Provider:   You may see Pixie Casino, MD or one of the following Advanced Practice Providers on your designated Care Team:    Almyra Deforest, PA-C  Fabian Sharp, PA-C or   Roby Lofts, [place]    Other Instructions

## 2019-10-01 ENCOUNTER — Encounter: Payer: Self-pay | Admitting: Internal Medicine

## 2019-10-01 NOTE — Progress Notes (Signed)
OFFICE NOTE  Chief Complaint:  Follow-up  Primary Care Physician: Shelda Pal, DO  HPI:  [name] is a 69 y.o. male with a past medial history significant for non-Hodgkin's lymphoma who presented to the emergency room 08/01/2019 with balance issues.  Patient said he had done his usual workout and was doing some work around the house when he noticed his balance was "off".  He also developed some left arm pain.  He had not had this before.  He went to the emergency room.  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  Carotid Dopplers revealed a 1 to 39% right internal carotid artery narrowing and a 60 to 79% left internal carotid artery narrowing.  Echocardiogram revealed cardiomyopathy with an EF of 35 to 40% with global hypokinesis.  He also had grade 1 diastolic dysfunction, moderate left atrial enlargement.  He was noted to have frequent ectopy with PACs and PVCs.  The hospital notes and discharge summary mention atrial fibrillation, this was not mentioned by Dr. Debara Pickett and I could find no evidence of atrial fibrillation by his EKGs.  He did have frequent ectopy.  The patient was placed on aspirin and Plavix although today he tells me he has not been taking aspirin.  His Mevacor was changed to Lipitor 80 mg. His lisinopril HCTZ was changed to Entresto.  Dr. Lysbeth Penner note also mentions adding low-dose carvedilol but this was not added at discharge.  The plan is for outpatient ischemic evaluation with a coronary CT scan.  Since discharge the patient says he is done well.  The patient is an avid exerciser and goes to the gym.  He used to be a runner but now just walks daily for exercise.  I asked him to back off on his weight lifting and push-ups.  I suggested he could do low weights, 10 pounds or so with high reps.  I suggested he continue walking for exercise as tolerated.  He and his wife have gone on a no sodium diet. He denies any LE edema or orthopnea.    09/29/2019  [name] is seen today in follow-up.  He was seen by Kerin Ransom, PA-C for hospital follow-up.  I saw him first in the hospital.  We were trying to work-up his cardiomyopathy as to whether it was ischemic or nonischemic.  Because of some abnormal beats and other features I recommended outpatient stress testing or cardiac CT.  In addition [name] had some orthostatic symptoms and apparently was taken off of Entresto and placed on a beta-blocker.  Those seem to have resolved and vitals however did not indicate orthostasis.  CT coronary angiogram was performed on 09/09/2019 showed a calcium score 145 which was 78th percentile for age and sex matched control.  There was some mild plaque in the RCA minimal plaque in the LAD and circumflex.  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  The aortic valve was also mildly calcified.  I reviewed those findings with him today.  Overall he seems to be feeling well and I would describe his cardiomyopathy as nonischemic.  Blood pressure is somewhat elevated today 156/92 and seems to be top normal to mildly elevated at home as well.   PMHx:  Past Medical History:  Diagnosis Date  . Anemia   . Cancer ([place])    Lymphoma  . Essential hypertension   . GERD (gastroesophageal reflux disease)   . GI bleed   .  HLD (hyperlipidemia)   . Weakness     Past Surgical History:  Procedure Laterality Date  . ESOPHAGOGASTRODUODENOSCOPY Left 01/18/2015   Procedure: ESOPHAGOGASTRODUODENOSCOPY (EGD);  Surgeon: Wilford Corner, MD;  Location: [facility] ENDOSCOPY;  Service: Endoscopy;  Laterality: Left;  . INGUINAL HERNIA REPAIR Right 01/22/2015   Procedure: RIGHT INGUINAL LYMPH NODE BX;  Surgeon: Donnie Mesa, MD;  Location: [place];  Service: General;  Laterality: Right;  . MEDIASTINOSCOPY N/A 05/05/2017   Procedure: MEDIASTINOSCOPY;  Surgeon: Grace Isaac, MD;  Location: [place];  Service: Thoracic;  Laterality: N/A;  . PORTACATH PLACEMENT  01/26/2015     power port with tip SVC/RA [place]  . SKIN SURGERY     Small benign cysts over left scalp removed  . VIDEO BRONCHOSCOPY WITH ENDOBRONCHIAL ULTRASOUND N/A 05/05/2017   Procedure: VIDEO BRONCHOSCOPY WITH ENDOBRONCHIAL ULTRASOUND;  Surgeon: Grace Isaac, MD;  Location: [facility] OR;  Service: Thoracic;  Laterality: N/A;    FAMHx:  Family History  Problem Relation Age of Onset  . Hypertension Mother   . Hypertension Father   . Kidney failure Father   . Hypertension Sister   . Diabetes Sister   . Prostate cancer Brother   . Lupus Sister     SOCHx:   reports that he has never smoked. He has never used smokeless tobacco. He reports current alcohol use. He reports that he does not use drugs.  ALLERGIES:  No Known Allergies  ROS: Pertinent items noted in HPI and remainder of comprehensive ROS otherwise negative.  HOME MEDS: Current Outpatient Medications on File Prior to Visit  Medication Sig Dispense Refill  . aspirin 81 MG EC tablet Take 1 tablet (81 mg total) by mouth daily. Swallow whole. 30 tablet 11  . atorvastatin (LIPITOR) 80 MG tablet Take 1 tablet (80 mg total) by mouth daily. 30 tablet 5  . carvedilol (COREG) 3.125 MG tablet Take 1 tablet (3.125 mg total) by mouth 2 (two) times daily with a meal. 180 tablet 3  . IMBRUVICA 420 MG TABS TAKE 1 TABLET BY MOUTH DAILY. 28 tablet 0   No current facility-administered medications on file prior to visit.    LABS/IMAGING: No results found for this or any previous visit (from the past 48 hour(s)). No results found.  LIPID PANEL:    Component Value Date/Time   CHOL 227 (H) 08/01/2019 1218   TRIG 91 08/01/2019 1218   HDL 54 08/01/2019 1218   CHOLHDL 4.2 08/01/2019 1218   VLDL 18 08/01/2019 1218   LDLCALC 155 (H) 08/01/2019 1218     WEIGHTS: Wt Readings from Last 3 Encounters:  09/29/19 208 lb (94.3 kg)  09/07/19 206 lb (93.4 kg)  08/11/19 200 lb (90.7 kg)    VITALS: BP (!) 156/92   Pulse (!) 55   Ht 5\' 10"  (1.778 m)    Wt 208 lb (94.3 kg)   BMI 29.84 kg/m   EXAM: General appearance: alert and no distress Neck: no carotid bruit, no JVD and thyroid not enlarged, symmetric, no tenderness/mass/nodules Lungs: clear to auscultation bilaterally Heart: regular rate and rhythm, S1, S2 normal, no murmur, click, rub or gallop Abdomen: soft, non-tender; bowel sounds normal; no masses,  no organomegaly Extremities: extremities normal, atraumatic, no cyanosis or edema Pulses: 2+ and symmetric Skin: Skin color, texture, turgor normal. No rashes or lesions Neurologic: Grossly normal Psych: Pleasant  EKG: Deferred  ASSESSMENT: 1. 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 Mild nonobstructive coronary disease by cardiac CTA (09/2019)-CAC score of  145 3. Dilated ascending aorta to 4.3 cm 4. Recent stroke 5. Ectopy with no evidence of atrial fibrillation by ZIO monitor 6. Hypertension 7. Dyslipidemia 8. Mantle cell lymphoma 9. 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   [name] is doing well although has heart failure and a nonischemic cardiomyopathy.  He may have had some orthostatic symptoms but really feels fairly well and was switched from Entresto back to carvedilol.  Blood pressure appears to likely tolerate the addition of Entresto back to his regimen in combination with carvedilol and will start back on 24/26 mg twice daily.  He will need follow-up carotid Dopplers in 6 months for his significant left internal carotid artery stenosis.  He also need a follow-up CT scan for his dilated ascending aorta.  Will target LDL to less than 70 and monitor cholesterol closely.  He is on high potency atorvastatin 80 mg.  Plan follow-up with me closely in 3 months or sooner as necessary.  Pixie Casino, MD, [facility], [place] Director of the Advanced Lipid Disorders &  Cardiovascular Risk Reduction Clinic Diplomate of the  American Board of Clinical Lipidology Attending Cardiologist  Direct Dial: [phone]  Fax: [phone]  Website:  [email or web address] 10/01/2019, 5:30 PM

## 2019-10-04 ENCOUNTER — Other Ambulatory Visit: Payer: Self-pay | Admitting: Oncology

## 2019-10-06 MED FILL — IMBRUVICA 420 MG TAB: 420 | 28 days supply | Qty: 28 | Fill #0

## 2019-10-12 ENCOUNTER — Telehealth: Payer: Self-pay | Admitting: *Deleted

## 2019-10-12 NOTE — Telephone Encounter (Signed)
This RN received VM from pt stating he has received a letter stating his assistance for [name] will expire in a month-   He has 30 days left of current supply- please call pt at [phone]

## 2019-10-21 ENCOUNTER — Other Ambulatory Visit: Payer: Self-pay

## 2019-10-21 ENCOUNTER — Inpatient Hospital Stay: Payer: Medicare HMO | Attending: Oncology

## 2019-10-21 ENCOUNTER — Inpatient Hospital Stay: Payer: Medicare HMO

## 2019-10-21 DIAGNOSIS — K276 Chronic or unspecified peptic ulcer, site unspecified, with both hemorrhage and perforation: Secondary | ICD-10-CM

## 2019-10-21 DIAGNOSIS — Z7982 Long term (current) use of aspirin: Secondary | ICD-10-CM | POA: Diagnosis not present

## 2019-10-21 DIAGNOSIS — R9389 Abnormal findings on diagnostic imaging of other specified body structures: Secondary | ICD-10-CM | POA: Diagnosis not present

## 2019-10-21 DIAGNOSIS — C8319 Mantle cell lymphoma, extranodal and solid organ sites: Secondary | ICD-10-CM | POA: Diagnosis not present

## 2019-10-21 DIAGNOSIS — E782 Mixed hyperlipidemia: Secondary | ICD-10-CM | POA: Diagnosis not present

## 2019-10-21 DIAGNOSIS — Z95828 Presence of other vascular implants and grafts: Secondary | ICD-10-CM

## 2019-10-21 DIAGNOSIS — D869 Sarcoidosis, unspecified: Secondary | ICD-10-CM | POA: Insufficient documentation

## 2019-10-21 LAB — CBC WITH DIFFERENTIAL/PLATELET
Abs Immature Granulocytes: 0.02 10*3/uL (ref 0.00–0.07)
Basophils Absolute: 0 10*3/uL (ref 0.0–0.1)
Basophils Relative: 1 %
Eosinophils Absolute: 0.1 10*3/uL (ref 0.0–0.5)
Eosinophils Relative: 1 %
HCT: 39.9 % (ref 39.0–52.0)
Hemoglobin: 12.8 g/dL — ABNORMAL LOW (ref 13.0–17.0)
Immature Granulocytes: 1 %
Lymphocytes Relative: 23 %
Lymphs Abs: 0.9 10*3/uL (ref 0.7–4.0)
MCH: 27.8 pg (ref 26.0–34.0)
MCHC: 32.1 g/dL (ref 30.0–36.0)
MCV: 86.6 fL (ref 80.0–100.0)
Monocytes Absolute: 0.7 10*3/uL (ref 0.1–1.0)
Monocytes Relative: 19 %
Neutro Abs: 2.2 10*3/uL (ref 1.7–7.7)
Neutrophils Relative %: 55 %
Platelets: 259 10*3/uL (ref 150–400)
RBC: 4.61 MIL/uL (ref 4.22–5.81)
RDW: 14.2 % (ref 11.5–15.5)
WBC: 3.9 10*3/uL — ABNORMAL LOW (ref 4.0–10.5)
nRBC: 0 % (ref 0.0–0.2)

## 2019-10-21 LAB — COMPREHENSIVE METABOLIC PANEL
ALT: 22 IU/L (ref 0–44)
ALT: 24 U/L (ref 0–44)
AST: 23 IU/L (ref 0–40)
AST: 21 U/L (ref 15–41)
Albumin/Globulin Ratio: 2 (ref 1.2–2.2)
Albumin: 4.4 g/dL (ref 3.8–4.8)
Albumin: 3.8 g/dL (ref 3.5–5.0)
Alkaline Phosphatase: 44 IU/L (ref 44–121)
Alkaline Phosphatase: 39 U/L (ref 38–126)
Anion gap: 7 (ref 5–15)
BUN/Creatinine Ratio: 15 (ref 10–24)
BUN: 17 mg/dL (ref 8–27)
BUN: 17 mg/dL (ref 8–23)
Bilirubin Total: 0.7 mg/dL (ref 0.0–1.2)
CO2: 24 mmol/L (ref 20–29)
CO2: 26 mmol/L (ref 22–32)
Calcium: 9.2 mg/dL (ref 8.6–10.2)
Calcium: 8.7 mg/dL — ABNORMAL LOW (ref 8.9–10.3)
Chloride: 105 mmol/L (ref 96–106)
Chloride: 107 mmol/L (ref 98–111)
Creatinine, Ser: 1.15 mg/dL (ref 0.76–1.27)
Creatinine, Ser: 1 mg/dL (ref 0.61–1.24)
GFR calc Af Amer: 76 mL/min/{1.73_m2} (ref >59)
GFR calc Af Amer: >60 mL/min (ref >60)
GFR calc non Af Amer: 66 mL/min/{1.73_m2} (ref >59)
GFR calc non Af Amer: >60 mL/min (ref >60)
Globulin, Total: 2.2 g/dL (ref 1.5–4.5)
Glucose, Bld: 81 mg/dL (ref 70–99)
Glucose: 90 mg/dL (ref 65–99)
Potassium: 4.7 mmol/L (ref 3.5–5.2)
Potassium: 4.2 mmol/L (ref 3.5–5.1)
Sodium: 142 mmol/L (ref 134–144)
Sodium: 140 mmol/L (ref 135–145)
Total Bilirubin: 0.9 mg/dL (ref 0.3–1.2)
Total Protein: 6.6 g/dL (ref 6.0–8.5)
Total Protein: 6.5 g/dL (ref 6.5–8.1)

## 2019-10-21 LAB — LIPID PANEL
Chol/HDL Ratio: 3 ratio (ref 0.0–5.0)
Cholesterol, Total: 124 mg/dL (ref 100–199)
HDL: 41 mg/dL (ref >39)
LDL Chol Calc (NIH): 67 mg/dL (ref 0–99)
Triglycerides: 84 mg/dL (ref 0–149)
VLDL Cholesterol Cal: 16 mg/dL (ref 5–40)

## 2019-10-21 LAB — LACTATE DEHYDROGENASE: LDH: 213 U/L — ABNORMAL HIGH (ref 98–192)

## 2019-10-21 MED ORDER — SODIUM CHLORIDE 0.9% FLUSH
10.0000 mL | INTRAVENOUS | Status: AC | PRN
  Administered 2019-10-21: 10 mL
  Filled 2019-10-21: qty 10

## 2019-10-21 MED ORDER — HEPARIN SOD (PORK) LOCK FLUSH 100 UNIT/ML IV SOLN
500.0000 [IU] | INTRAVENOUS | Status: AC | PRN
  Administered 2019-10-21: 500 [IU]
  Filled 2019-10-21: qty 5

## 2019-10-21 NOTE — Patient Instructions (Signed)

## 2019-10-23 LAB — BETA 2 MICROGLOBULIN, SERUM: Beta-2 Microglobulin: 1.5 mg/L (ref 0.6–2.4)

## 2019-10-26 NOTE — Progress Notes (Signed)
[place]  [phone] [phone]    ID: [name] DOB: [birth year]  MR#: [id number]  LPF#:[id number]  Patient Care Team: Shelda Pal, DO as PCP - General (Family Medicine) Debara Pickett Nadean Corwin, MD as PCP - Cardiology (Cardiology) Marshel Golubski, Virgie Dad, MD as Consulting Physician (Oncology) Donnie Mesa, MD as Consulting Physician (General Surgery) Wilford Corner, MD as Consulting Physician (Gastroenterology) Ples Specter, MD as Referring Physician (Internal Medicine) Larina Earthly, MD as Referring Physician (Hematology and Oncology) Grace Isaac, MD as Consulting Physician (Cardiothoracic Surgery) OTHER MD: Napoleon Form MD; Dr Glennon Mac [phone]   CHIEF COMPLAINT: 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  Recall biopsy of 1 of these lymph nodes April 2019 showed multiple granulomata, no clear evidence of lymphoma.  He presented to the ED on 08/01/2019 with left-sided numbness and imbalance. 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  He was started on ASA 81 mg (and Plavix 75 mg daily x3 weeks, then stopped).  MRA was unremarkable.  He also had carotid Dopplers and a 2D echo which showed an ejection fraction in the 35-40%.  There was no evidence of embolus.  All the labs included an LDL of 155 and a hemoglobin A1c of 6.1.  He was started on Lipitor 80 mg daily as well as Coreg and Entresto.  He continues on ibrutinib.  He generally does well with this medication but he is having some unusual symptoms and he and his  wife are wondering if the ibrutinib could possibly be the cause.  He is not having bruising or palpitations which would be more likely.  Cardiac chest CT September 09, 2019 shows a calcium score of 145.  There was a small ascending aortic aneurysm measuring 4.3 cm.  REVIEW OF SYSTEMS: [name] has no residual from his recent stroke.  He is doing his workout and all his normal activities although he is not lifting as heavy weights as before.  He is doing more reps on lower weights.  He tells me his blood pressure is still a bit higher than he would like it to be.  He does not know what his LDL is.  He has had no fevers rash bleeding or bruising.  He does have a persistent dry cough.  A detailed review of systems today was otherwise stable.   HISTORY OF PRESENT ILLNESS: From the original intake note:   "[name]" was in his usual good health until early December 2016, when he had a brief syncopal episode. He "blew this off" and it did not happen again until 01/17/2015 when he felt weak at work and later fainted at home. He was brought to the ED where he was found to be guaiac positive (denies melena before that date) with a Hb of 4.5 MCV 82.2, platelets 295K, WBC 9.1 and normal INR and PTT. He was admitted and underwent EGD under Dr Michail Sermon 01/18/2015 showing a very large duodenal ulcer w clot. Abdominal US 01/19/2015 was not very informative but CT abd/pelvis 12/17,2016 showed adenopathy in the anterior mediastinum, retroperitoneum and pelvis, with a large ulcerated mass involving the distal stomach/proximal duodenum and extending into  the gastrohepatic region.   On 01/22/2015 he underwent right inguinal lymph node biopsy, consistent with mantle cell lymphoma (([id number] and [id number])). Specifically the lymph node showed effacement of the architecture by sheets of small to medium sized lymphocytes with no apparent nodularity. The cells were positive for CD20, CD5, BCL-2, CD21, and cyclin D1. He 67 was low  (10-20%). CD21 revealed a few residual germinal centers area at the B cells were negative for CD10, and BCL-6. CD23 was negative.  On 02/06/2015 the patient underwent a bone marrow aspirate and biopsy. The pathology from this procedure (FZB 17-2 and 17-7) was positive for involvement by non-Hodgkin's lymphoma with numerous atypical interstitial and paratrabecular lymphoid aggregates consistent with mantle cell lymphoma. Iron stain showed scant iron flow cytometry confirmed a monoclonal B-cell population, lambda restricted, consistent with the patient's known diagnosis of mantle cell lymphoma.  On 01/18/2015 the patient underwent EGD which found a massive duodenal bulb ulcer with adherent clot, although no active bleeding.his hemoglobin was in the 8.4 range and stable. He was discharged on 01/22/2015, but within 24 hours was readmitted with syncope and altered mental status. He had aspirated and required intubation for his respiratory failure. Hemoglobin was down to 6.3. An NG tube was placed, the patient was multiply transfused, and Surgery was considered but felt not to be feasible.accordingly the patient underwent arteriography 01/23/2015 which found the superior mesenteric and celiac arteries not to be the involved vessels. The gastroduodenal artery showed multiple branches providing supply to the hypervascular duodenal mass, and this artery was coil embolized.. Also a port was placed at the time of this procedure with a view to eventual chemotherapy.  He was discharged 01/26/2015,but required readmission 02/07/2015 for another presyncopal episode. He presented to the emergency room where he was found to have a hemoglobin of 7.5. The patient was transfusedIIA hemoglobin of 10 and was discharged 02/07/2014. He was scheduled to start CHOP right toxin 02/08/2014.  His subsequent history is as detailed below.   PAST MEDICAL HISTORY: Past Medical History:  Diagnosis Date  . Anemia   . Cancer ([place])     Lymphoma  . Essential hypertension   . GERD (gastroesophageal reflux disease)   . GI bleed   . HLD (hyperlipidemia)   . Weakness     PAST SURGICAL HISTORY: Past Surgical History:  Procedure Laterality Date  . ESOPHAGOGASTRODUODENOSCOPY Left 01/18/2015   Procedure: ESOPHAGOGASTRODUODENOSCOPY (EGD);  Surgeon: Wilford Corner, MD;  Location[facility] END[facility]COPY;  Service: Endoscopy;  Laterality: Left;  . INGUINAL HERNIA REPAIR Right 01/22/2015   Procedure: RIGHT INGUINAL LYMPH NODE BX;  Surgeon: Donnie Mesa, MD;  Location: [place];  Service: General;  Laterality: Right;  . MEDIASTINOSCOPY N/A 05/05/2017   Procedure: MEDIASTINOSCOPY;  Surgeon: Grace Isaac, MD;  Location: [place];  Service: Thoracic;  Laterality: N/A;  . PORTACATH PLACEMENT  01/26/2015    power port with tip SVC/RA [place]  . SKIN SURGERY     Small benign cysts over left scalp removed  . VIDEO BRONCHOSCOPY WITH ENDOBRONCHIAL ULTRASOUND N/A 05/05/2017   Procedure: VIDEO BRONCHOSCOPY WITH ENDOBRONCHIAL ULTRASOUND;  Surgeon: Grace Isaac, MD;  Location: [place] [facility];  Service: Thoracic;  Laterality: N/A;    FAMILY HISTORY Family History  Problem Relation Age of Onset  . Hypertension Mother   . Hypertension Father   . Kidney failure Father   . Hypertension Sister   . Diabetes Sister   . Prostate cancer Brother   . Lupus Sister    The patient's  father died from CHF complications age 22. The patient's mother is 41 y/o as of December 2016. The patient has 9 brothers, 8 sisters. One brother has prostate cancer. There is no other cancer history in the family to his knowledge.   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  He retired in 2020.  His wife of 42+ years, [name], is disabled due to RA. Daughter, [name] works as a Nurse, children's for [employer] (she previously worked at the [employer] in records). Son, [name] owns a window washing business. Son, [name] is in Airline pilot at [employer].  All live in [place]. The patient has 3 grandchildren. He attends a [employer].    ADVANCED DIRECTIVES: Wife, [name], is automatically his HCPOA   HEALTH MAINTENANCE: Social History   Tobacco Use  . Smoking status: Never Smoker  . Smokeless tobacco: Never Used  Vaping Use  . Vaping Use: Never used  Substance Use Topics  . Alcohol use: Yes    Comment: occasional beer  . Drug use: No    Colonoscopy:  PSA: 2.27 on 01/22/2015  Hepatitis serologies (B surface antigen, hepatitis B core antibody and hepatitis C IgM) negative 01/18/2015  No Known Allergies  Current Outpatient Medications  Medication Sig Dispense Refill  . aspirin 81 MG EC tablet Take 1 tablet (81 mg total) by mouth daily. Swallow whole. 30 tablet 11  . atorvastatin (LIPITOR) 80 MG tablet Take 1 tablet (80 mg total) by mouth daily. 30 tablet 5  . carvedilol (COREG) 3.125 MG tablet Take 1 tablet (3.125 mg total) by mouth 2 (two) times daily with a meal. 180 tablet 3  . IMBRUVICA 420 MG TABS TAKE 1 TABLET BY MOUTH DAILY. 28 tablet 0  . sacubitril-valsartan (ENTRESTO) 24-26 MG Take 1 tablet by mouth 2 (two) times daily. 60 tablet 11   No current facility-administered medications for this visit.    OBJECTIVE: African-American man who appears younger than stated age Vitals:   10/27/19 1040  BP: (!) 151/92  Pulse: 67  Resp: 18  Temp: 97.6 F (36.4 C)  SpO2: 99%   Wt Readings from Last 3 Encounters:  10/27/19 210 lb 4.8 oz (95.4 kg)  09/29/19 208 lb (94.3 kg)  09/07/19 206 lb (93.4 kg)   Body mass index is 30.17 kg/m.    ECOG FS:1 - Symptomatic but completely ambulatory  Sclerae unicteric, EOMs intact Wearing a mask No cervical or supraclavicular adenopathy, no axillary or inguinal adenopathy Lungs no rales or rhonchi Heart regular rate and rhythm Abd soft, nontender, positive bowel sounds MSK no focal spinal tenderness, no upper extremity lymphedema Neuro: nonfocal, well  oriented, appropriate affect   LAB RESULTS:  CMP     Component Value Date/Time   NA 140 10/21/2019 1028   NA 142 10/21/2019 0904   NA 140 01/16/2017 0837   K 4.2 10/21/2019 1028   K 3.9 01/16/2017 0837   CL 107 10/21/2019 1028   CO2 26 10/21/2019 1028   CO2 24 01/16/2017 0837   GLUCOSE 81 10/21/2019 1028   GLUCOSE 90 10/21/2019 0904   GLUCOSE 102 01/16/2017 0837   BUN 17 10/21/2019 1028   BUN 17 10/21/2019 0904   BUN 20.4 01/16/2017 0837   CREATININE 1.00 10/21/2019 1028   CREATININE 1.2 01/16/2017 0837   CALCIUM 8.7 (L) 10/21/2019 1028   CALCIUM 8.9 01/16/2017 0837   PROT 6.5 10/21/2019 1028   PROT 6.6 10/21/2019  0904   PROT 6.9 01/16/2017 0837   ALBUMIN 3.8 10/21/2019 1028   ALBUMIN 4.4 10/21/2019 0904   ALBUMIN 4.0 01/16/2017 0837   AST 21 10/21/2019 1028   AST 20 01/16/2017 0837   ALT 24 10/21/2019 1028   ALT 22 01/16/2017 0837   ALKPHOS 39 10/21/2019 1028   ALKPHOS 47 01/16/2017 0837   BILITOT 0.9 10/21/2019 1028   BILITOT 0.7 10/21/2019 0904   BILITOT 0.36 01/16/2017 0837   GFRNONAA >60 10/21/2019 1028   GFRAA >60 10/21/2019 1028    No results found for: TOTALPROTELP, ALBUMINELP, A1GS, A2GS, BETS, BETA2SER, GAMS, MSPIKE, SPEI  Lab Results  Component Value Date   WBC 3.9 (L) 10/21/2019   NEUTROABS 2.2 10/21/2019   HGB 12.8 (L) 10/21/2019   HCT 39.9 10/21/2019   MCV 86.6 10/21/2019   PLT 259 10/21/2019    No results found for: LABCA2  No components found for: [id number]  No results for input(s): INR in the last 168 hours.  No results found for: LABCA2  No results found for: DHH078  No results found for: XPB639  No results found for: NRC934  No results found for: CA2729  No components found for: HGQUANT  No results found for: CEA1 / No results found for: CEA1   No results found for: AFPTUMOR  No results found for: CHROMOGRNA  No results found for: KPAFRELGTCHN, LAMBDASER, KAPLAMBRATIO (kappa/lambda light chains)  No results found  for: HGBA, HGBA2QUANT, HGBFQUANT, HGBSQUAN (Hemoglobinopathy evaluation)   Lab Results  Component Value Date   LDH 213 (H) 10/21/2019    Lab Results  Component Value Date   IRON 115 02/16/2015   TIBC 345 02/16/2015   IRONPCTSAT 33 02/16/2015   (Iron and TIBC)  Lab Results  Component Value Date   FERRITIN 85 02/09/2015    Urinalysis    Component Value Date/Time   COLORURINE STRAW (A) 08/01/2019 1426   APPEARANCEUR CLEAR 08/01/2019 1426   LABSPEC 1.008 08/01/2019 1426   PHURINE 7.0 08/01/2019 1426   GLUCOSEU NEGATIVE 08/01/2019 1426   HGBUR NEGATIVE 08/01/2019 1426   BILIRUBINUR NEGATIVE 08/01/2019 1426   KETONESUR NEGATIVE 08/01/2019 1426   PROTEINUR NEGATIVE 08/01/2019 1426   NITRITE NEGATIVE 08/01/2019 1426   LEUKOCYTESUR NEGATIVE 08/01/2019 1426    STUDIES: No results found.    ASSESSMENT: 69 y.o. [place] man with a diagnosis of mantle cell non-Hodgkin's lymphoma of extranodal and solid organ sites presenting with syncope secondary to bleeding from a large ulcerated duodenal ulcer  (a) s/p coil embolization of the feeding (gastro-duodenal) artery 01/23/2015  (b) anemia--scant iron stores on bone marrow biopsy--s/p feraheme 02/13/2015  (1) right inguinal lymph node biopsy 01/22/2015 confirms mantle cell lymphoma  (a) bone marrow biopsy 02/06/2015 positive for involvement by the patient's mantle cell lymphoma  (b) IPI score of 5 (high risk) predicts a 5 year progression free survival of 50% with CHOP-Rituxan chemotherapy  (c) MIPI score of 5 (intermediate risk) predicts a median survival of 58 months  (d) the cells were CD5 positive, CD19 and 20+, CD23 negative, lambda restricted  (2) CHOP/Rituxan started 02/09/2015, completed 8 cycles 07/05/2015  (3)  [facility] consultation 04/06/2015. Patient opted against transplant consolidation  (4) rituximab maintenance started 08/16/2015, repeat every 2 months, discontinued February 2019  (a) PET scan had shown a suggestion  of recurrence, but see (6) below  (5) PSA increase noted July 2017, back to baseline by December 2017  (6) restaging PET scan 11/17/2016 is consistent with disease progression  (a)  repeat PET scan 02/02/2017 confirms increase in lymph nodes  (b) PET scan 04/20/2017 shows increasing hypermetabolic adenopathy  (c) bronchoscopy/mediastinoscopy 05/05/2017 shows granulomatous disease, noncaseating, with negative AFB/ GMS stains; <10% of lymphocytes are B-cell with slight lambda excess  (d) PET scan on 07/06/2017 shows mild increase in mediastinal lymphadenopathy. Increased hypermetabolic supraclavicular lymph node.  (e) PET scan 12/24/2017 interpreted as showing evidence of partial response   (f) PET 09/21/2018 shows decreased size and metabolism of scattered measurable nodes; [place] 4   (7) ibrutinib 420 mg daily started 02/09/2017, discontinued March 2019  (a) resumed April 2019 at 560 mg daily  (8) granulomatous inflammation noted on pathology from 05/05/2017 mediastinoscopy  (a) fungal bacterial and acid-fast studies negative   (b) sarcoidosis is the main differential   PLAN: [name] is now nearly 5 years out from initial diagnosis of mantle cell non-Hodgkin's lymphoma.  There is no clinical evidence of disease activity.  The over read on the recent cardiac CT of the chest is found as some inflammation in the right lower lung.  This is more likely to be related to his sarcoid then to his lymphoma but we are going to obtain a PET scan in October to restage in any case.  I think the cough also [name] be related to that.  We can certainly treat that with steroids if it needs treatment but we have been reluctant to add that to his already immunocompromised state.  The main concern with the imbruvica is bruising and bleeding and of course he is also now on aspirin.  He tolerated the Plavix well.  I am not making any changes in his dosing.  Went over his blood pressure and LDL readings and he  understands what the goals are.  He is working with Dr. Debara Pickett to get that accomplished  Otherwise he [name] see me again in December after his PET scan in October.  He knows to call for any other issue that may develop before the next visit  Total encounter time 30 minutes.*   Tayllor Breitenstein, Virgie Dad, MD  10/27/19 11:16 AM Medical Oncology and Hematology [facility] [place], [place] [address] Tel. [phone]    Fax. [phone]   I, Wilburn Mylar, am acting as scribe for Dr. Virgie Dad. Hildegard Hlavac.  I, Lurline Del MD, have reviewed the above documentation for accuracy and completeness, and I agree with the above.   *Total Encounter Time as defined by the Centers for Medicare and Medicaid Services includes, in addition to the face-to-face time of a patient visit (documented in the note above) non-face-to-face time: obtaining and reviewing outside history, ordering and reviewing medications, tests or procedures, care coordination (communications with other health care professionals or caregivers) and documentation in the medical record.

## 2019-10-27 ENCOUNTER — Other Ambulatory Visit: Payer: Self-pay | Admitting: Pharmacist

## 2019-10-27 ENCOUNTER — Other Ambulatory Visit: Payer: Self-pay

## 2019-10-27 ENCOUNTER — Inpatient Hospital Stay (HOSPITAL_BASED_OUTPATIENT_CLINIC_OR_DEPARTMENT_OTHER): Payer: Medicare HMO | Admitting: Oncology

## 2019-10-27 VITALS — BP 151/92 | HR 67 | Temp 97.6°F | Resp 18 | Ht 70.0 in | Wt 210.3 lb

## 2019-10-27 DIAGNOSIS — G459 Transient cerebral ischemic attack, unspecified: Secondary | ICD-10-CM

## 2019-10-27 DIAGNOSIS — R9389 Abnormal findings on diagnostic imaging of other specified body structures: Secondary | ICD-10-CM | POA: Diagnosis not present

## 2019-10-27 DIAGNOSIS — C8319 Mantle cell lymphoma, extranodal and solid organ sites: Secondary | ICD-10-CM

## 2019-10-27 DIAGNOSIS — I6322 Cerebral infarction due to unspecified occlusion or stenosis of basilar arteries: Secondary | ICD-10-CM | POA: Diagnosis not present

## 2019-10-27 DIAGNOSIS — I6329 Cerebral infarction due to unspecified occlusion or stenosis of other precerebral arteries: Secondary | ICD-10-CM

## 2019-10-27 DIAGNOSIS — Z7982 Long term (current) use of aspirin: Secondary | ICD-10-CM | POA: Diagnosis not present

## 2019-10-27 DIAGNOSIS — D869 Sarcoidosis, unspecified: Secondary | ICD-10-CM

## 2019-10-27 MED ORDER — IMBRUVICA 420 MG PO TABS: 1.0000 | ORAL_TABLET | Freq: Every day | ORAL | 0 refills | Status: DC

## 2019-10-27 NOTE — Progress Notes (Signed)
Oral Oncology Pharmacist Encounter  Prescription refill for Imbruvica sent to in error to [facility]. Patient fills Imbruvica at [facility]. Prescription redirected to [facility].  Leron Croak, PharmD, BCPS Hematology/Oncology Clinical Pharmacist [facility] [phone] 10/27/2019 12:08 PM

## 2019-10-28 ENCOUNTER — Telehealth: Payer: Self-pay | Admitting: Oncology

## 2019-10-28 NOTE — Telephone Encounter (Signed)
Scheduled per 9/23 los. Called and spoke with pt, confirmed 12/9 appts

## 2019-11-02 MED FILL — IMBRUVICA 420 MG TAB: 420 | 28 days supply | Qty: 28 | Fill #0

## 2019-11-08 ENCOUNTER — Telehealth: Payer: Self-pay

## 2019-11-08 NOTE — Telephone Encounter (Signed)
Oral Oncology Patient Advocate Encounter   Was successful in securing patient an $107 grant from [employer]) to provide copayment coverage for Imbruvica.  This will keep the out of pocket expense at $0.     I have spoken with the patient.    The billing information is as follows and has been shared with [place].   Member ID: [id number] Group ID: [id number] RxBin: [id number] Dates of Eligibility: 10/28/19 through 10/26/20  Fund:  [place] Patient [place] Phone [phone] Fax [phone] 11/08/2019 3:57 PM

## 2019-11-15 ENCOUNTER — Telehealth: Payer: Self-pay | Admitting: Adult Health

## 2019-11-15 NOTE — Telephone Encounter (Signed)
Pt's wife, Ashden Sonnenberg,  He's having episodes of memory loss, delusional. Would like a call from the nurse to discuss may be working him in.

## 2019-11-16 NOTE — Telephone Encounter (Signed)
If this is a new onset symptom, highly recommend proceeding to ED for further evaluation to rule out acute stroke or other underlying abnormality contributing to symptoms.  Also recommend following up with PCP in the near future to rule out acute infections such as UTI that could be increasing confusion

## 2019-11-16 NOTE — Telephone Encounter (Signed)
Wife of [name] is a 69 y.o. male called in requesting a sooner appt. Ms. Ore said pt was very confused yesterday. Pt was asking the same question and not making sense. Pt's wife said she did not take him to the hospital to be evaluated because she said there is not much they can do at the hospital  Pt's appt was changed from Dec 6th to October 27 @815am 

## 2019-11-16 NOTE — Telephone Encounter (Signed)
Called wife and advised her of NP'S advice. She stated he is sleeping a lot today, complains of lower back hurting if awake. She stated that he did complain the other day of feeling like he had to urinate but having trouble getting it out. I advised she call PCP to have him checked for UTI. She agreed,  verbalized understanding, appreciation.

## 2019-11-24 ENCOUNTER — Other Ambulatory Visit: Payer: Self-pay | Admitting: Oncology

## 2019-11-24 DIAGNOSIS — C8319 Mantle cell lymphoma, extranodal and solid organ sites: Secondary | ICD-10-CM

## 2019-11-29 ENCOUNTER — Ambulatory Visit (HOSPITAL_COMMUNITY)
Admission: RE | Admit: 2019-11-29 | Discharge: 2019-11-29 | Disposition: A | Discharge status: Home / Self Care | Payer: Medicare HMO | Source: Ambulatory Visit | Attending: Oncology | Admitting: Oncology

## 2019-11-29 ENCOUNTER — Other Ambulatory Visit: Payer: Self-pay

## 2019-11-29 ENCOUNTER — Other Ambulatory Visit: Payer: Self-pay | Admitting: Oncology

## 2019-11-29 DIAGNOSIS — I517 Cardiomegaly: Secondary | ICD-10-CM | POA: Insufficient documentation

## 2019-11-29 DIAGNOSIS — I700 Atherosclerosis of aorta: Secondary | ICD-10-CM | POA: Diagnosis not present

## 2019-11-29 DIAGNOSIS — D1739 Benign lipomatous neoplasm of skin and subcutaneous tissue of other sites: Secondary | ICD-10-CM | POA: Diagnosis not present

## 2019-11-29 DIAGNOSIS — G459 Transient cerebral ischemic attack, unspecified: Secondary | ICD-10-CM | POA: Insufficient documentation

## 2019-11-29 DIAGNOSIS — I6322 Cerebral infarction due to unspecified occlusion or stenosis of basilar arteries: Secondary | ICD-10-CM | POA: Diagnosis not present

## 2019-11-29 DIAGNOSIS — C8310 Mantle cell lymphoma, unspecified site: Secondary | ICD-10-CM | POA: Diagnosis not present

## 2019-11-29 DIAGNOSIS — D171 Benign lipomatous neoplasm of skin and subcutaneous tissue of trunk: Secondary | ICD-10-CM | POA: Diagnosis not present

## 2019-11-29 DIAGNOSIS — C8319 Mantle cell lymphoma, extranodal and solid organ sites: Secondary | ICD-10-CM | POA: Diagnosis not present

## 2019-11-29 DIAGNOSIS — D869 Sarcoidosis, unspecified: Secondary | ICD-10-CM

## 2019-11-29 DIAGNOSIS — I6329 Cerebral infarction due to unspecified occlusion or stenosis of other precerebral arteries: Secondary | ICD-10-CM

## 2019-11-29 DIAGNOSIS — I2510 Atherosclerotic heart disease of native coronary artery without angina pectoris: Secondary | ICD-10-CM | POA: Diagnosis not present

## 2019-11-29 LAB — GLUCOSE, CAPILLARY: Glucose-Capillary: 97 mg/dL (ref 70–99)

## 2019-11-29 IMAGING — CT NM PET TUM IMG RESTAG (PS) SKULL BASE T - THIGH
9 series · 19 of 25 positions shown · non-contrast
Comparison: Multiple exams, including [DATE]

CLINICAL DATA: Subsequent treatment strategy for mantle cell
lymphoma.

EXAM:
NUCLEAR MEDICINE PET SKULL BASE TO THIGH
TECHNIQUE: 10.5 mCi F-18 FDG was injected intravenously. Full-ring PET imaging
was performed from the skull base to thigh after the radiotracer. CT
data was obtained and used for attenuation correction and anatomic
localization.
Fasting blood glucose: 97 mg/dl

[Series 3: pet sk_thigh ac · axial · 5.0mm · 4.07mm/px · z∈[-1108,-196]mm · 3 of 229 slices shown]
[im 1/229]
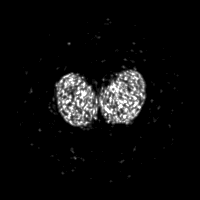
[im 77/229]
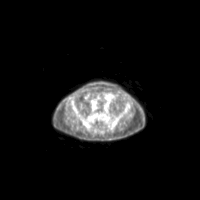
[im 229/229]
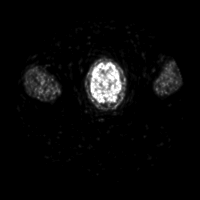

[Series 4: ct sk_thigh 5.0 hd_fov · axial · 5.0mm · 1.17mm/px · z∈[-1108,-196]mm · 4 of 229 slices shown]
[im 1/229]
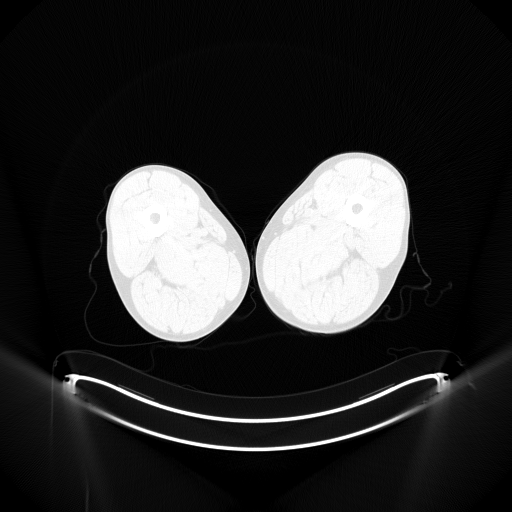
[im 58/229]
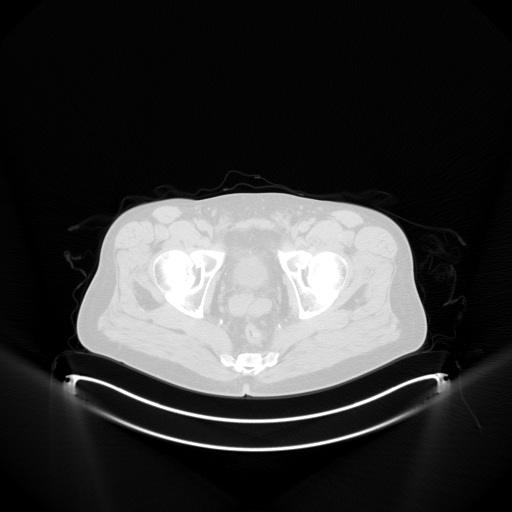
[im 172/229]
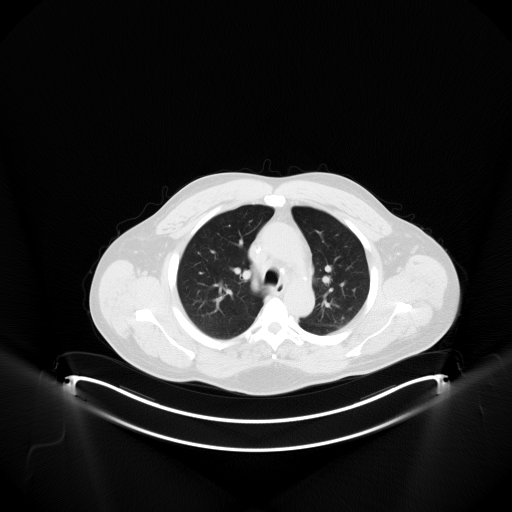
[im 229/229  brain]
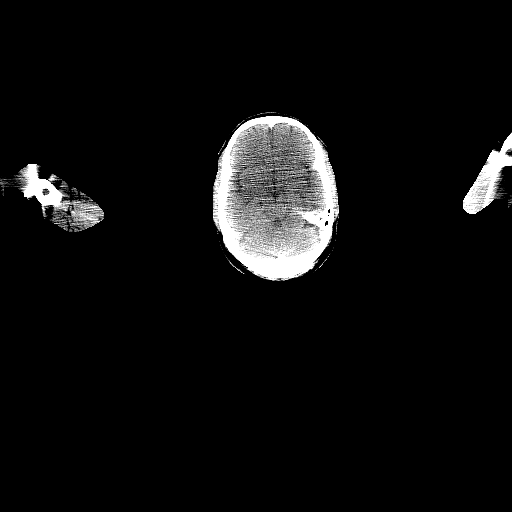

[Series 5: pet sk_thigh nac · axial · 5.0mm · 4.07mm/px · z∈[-1108,-196]mm · 4 of 229 slices shown]
[im 1/229]
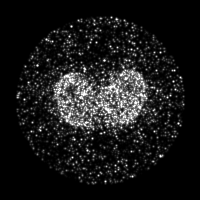
[im 115/229]
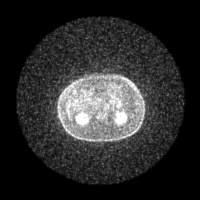
[im 172/229]
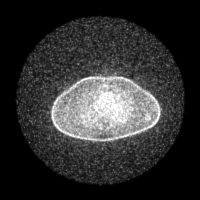
[im 229/229]
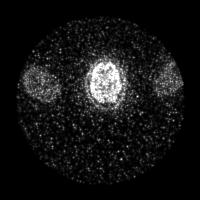

[Series 8: ct sk_thigh 5.0 br59 (id)_bone · axial · 5.0mm · 0.79mm/px · 1 of 77 slices shown]
[im 77/77]
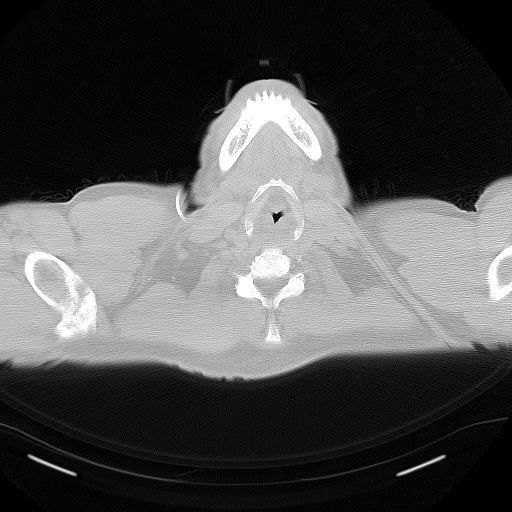

[Series 603: <mip collection> · coronal · 1.89mm/px · 1 of 32 slices shown]
[im 1/32]
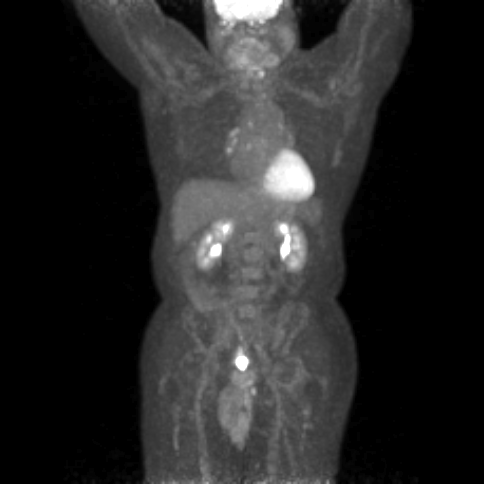

[Series 604: fused cor · 1 of 45 slices shown]
[im 1/45]
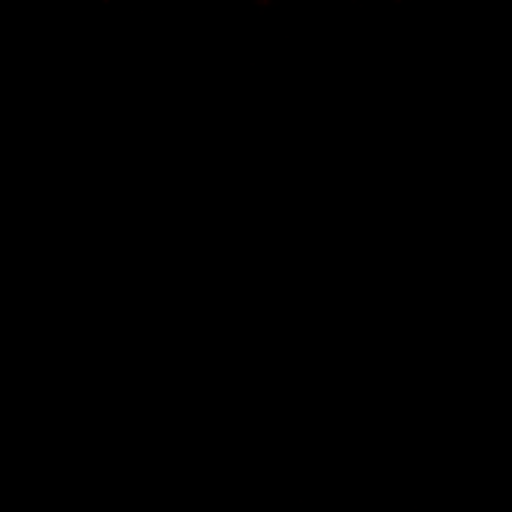

[Series 605: range-ct sk_thigh 5.0 hd_fov-tra-<alpha range> · 3 of 214 slices shown]
[im 54/214]
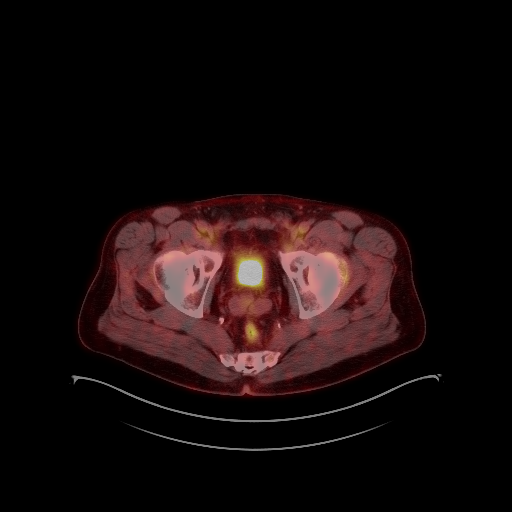
[im 107/214]
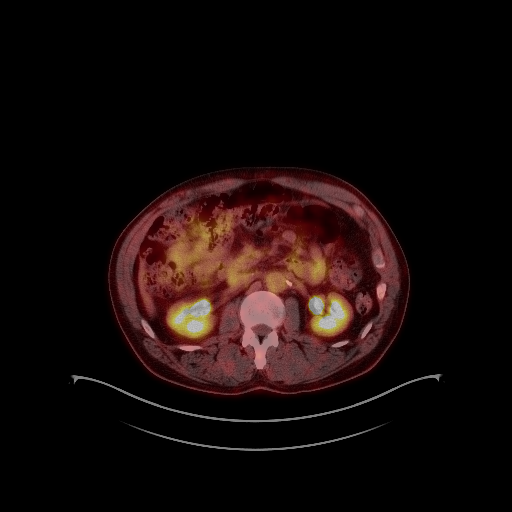
[im 160/214]
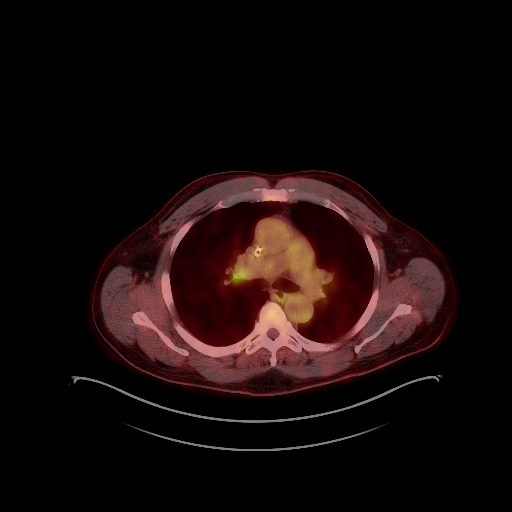

[Series 3266: results mm oncology reading · 5.0mm · 0.97mm/px · 1 of 8 slices shown (1 of 2)]
[im 1/8]
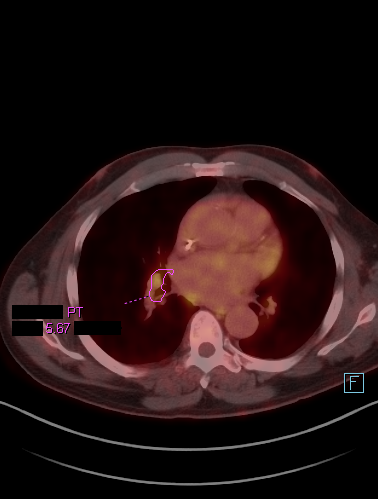

[Series 3606: results mm oncology reading · 5.0mm · 0.50mm/px · 1 of 1 slices shown (2 of 2)]
[im 1/1]
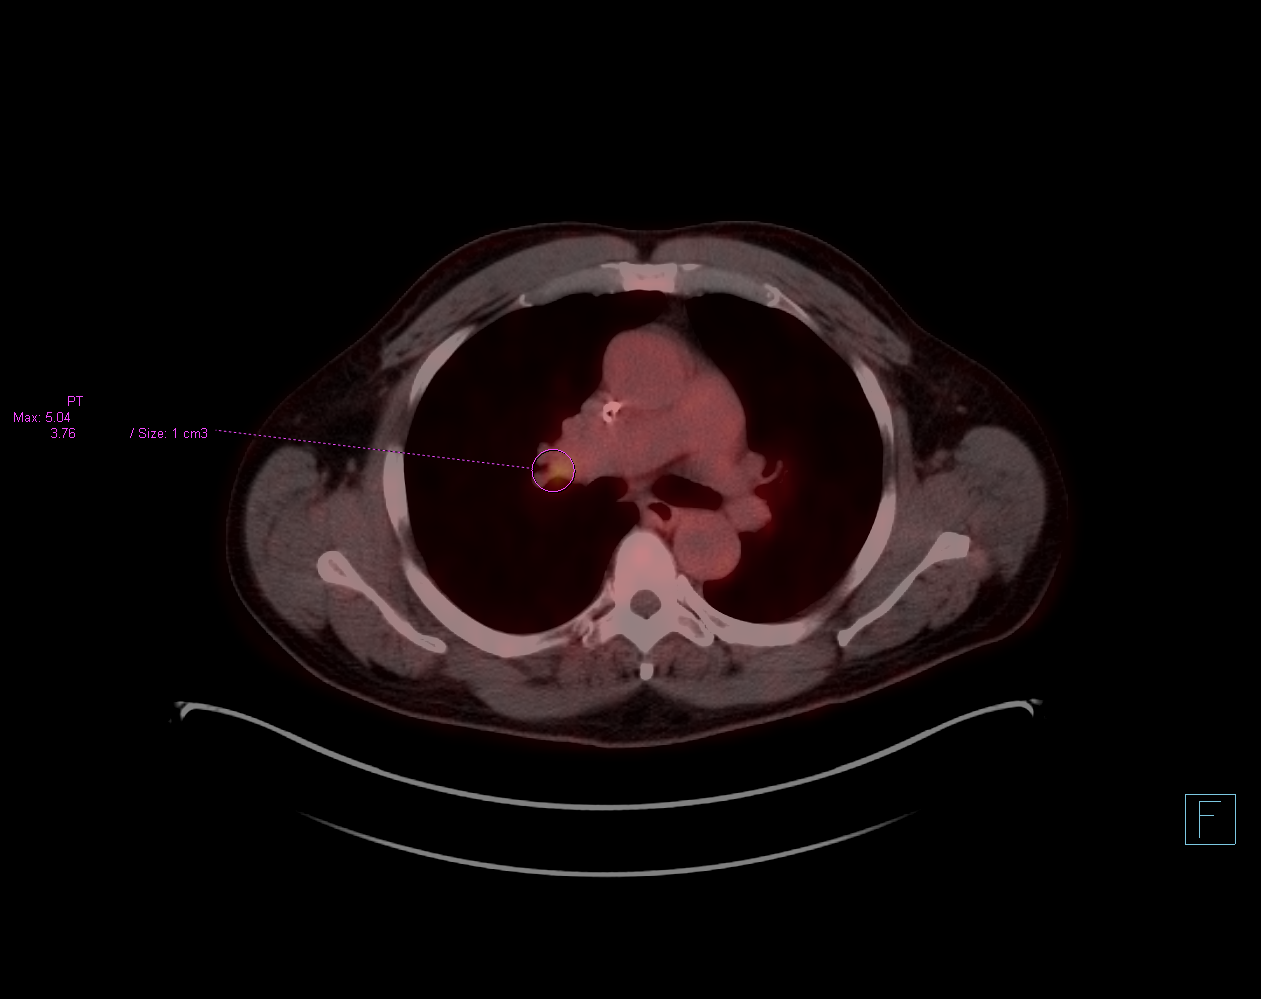

[19 of 25 positions shown; findings below may reference images not displayed]

FINDINGS: Mediastinal blood pool activity: SUV max

Liver activity: SUV max

NECK: Salivary glottic activity is similar to prior and likely
physiologic. No hypermetabolic adenopathy identified.

Incidental CT findings: Bilateral common carotid atherosclerotic
calcification.

CHEST: Small but hypermetabolic paratracheal, AP window, right
hilar, subcarinal, and left infrahilar lymph nodes are identified.
An index small right hilar lymph node has a maximum SUV of
([HOSPITAL] 4), formerly 4.6 ([HOSPITAL] 4). Some of the lymph nodes
were suppressed due to a band of lack of signal on the PET data in
the chest on the prior exam, but accounting for this are probably
grossly similar previous. An index small right paratracheal lymph
node measures 0.7 cm in short axis on image 58 of series 4 (formerly
the same) with maximum SUV of 4.7, [[facility]OSPITAL] 4 (previously not
measurable due to signal loss on the prior exam).

Incidental CT findings: Suspected right paracentral subcutaneous
lipoma along the thoracic back region for example on image 53 of
series 4, no abnormal metabolic activity, overall benign appearance.
Mild cardiomegaly. Coronary, aortic arch, and branch vessel
atherosclerotic vascular disease.

ABDOMEN/PELVIS: Multifocal accentuated activity in the mildly
enlarged prostate gland, including in the anterior left apex where
the maximum SUV is 5.9. Faint stranding around the prostate gland.
Nonspecific for benign versus malignant causes. Previously prostate
activity in this region was less, with maximum SUV of 3.8.

Linear band of density extending caudad from the coccyx in the
subcutaneous tissues of the left medial buttock, medial to the
gluteus maximus muscle, but tapering out in the subcutaneous tissues
rather than extending to the cutaneous surface. This has a maximum
SUV of 6.6 and given the unusual appearance is most likely
inflammatory and less likely to be an unusual elongated lymph node.

No splenomegaly or abnormal splenic activity.

Incidental CT findings: Thick-walled urinary bladder is nondistended
but there is also some mild perivesical stranding, cystitis is a
distinct possibility. Aortoiliac atherosclerotic vascular disease.

SKELETON: No significant abnormal hypermetabolic activity in this
region.

Incidental CT findings: Bridging spurring of the right sacroiliac
joint.
IMPRESSION: 1. Continued [[facility]OSPITAL] 4 activity in small thoracic lymph nodes.
Overall activity is similar to slightly increased compared to prior;
previous assessment of thoracic nodes was limited due to a band of
poor signal to noise ratio through the chest on the previous exam.
2. Newly accentuated activity in the prostate gland compared to the
prior exam, with mild stranding around the prostate gland and
urinary bladder. Cystitis/prostatitis not excluded, although I would
suggest correlating with PSA levels in determining whether further
workup for prostate cancer is indicated.
3. Unusual thin linear band of density extending in the left medial
buttock subcutaneous tissues caudad from the coccyx. This has
accentuated metabolic activity and is probably inflammatory, less
likely to be from a small elongated lymph node. Surveillance
suggested.
4. Other imaging findings of potential clinical significance:
Subcutaneous lipoma in the right paracentral upper back. Mild
cardiomegaly. Aortic Atherosclerosis ([3E]-[3E]). Coronary
atherosclerosis.

## 2019-11-29 MED ORDER — FLUDEOXYGLUCOSE F - 18 (FDG) INJECTION
10.5000 | Freq: Once | INTRAVENOUS | Status: AC | PRN
  Administered 2019-11-29: 10.5 via INTRAVENOUS

## 2019-11-29 MED ORDER — NITROFURANTOIN MONOHYD MACRO 100 MG PO CAPS
100.0000 mg | ORAL_CAPSULE | Freq: Two times a day (BID) | ORAL | 0 refills | Status: DC
Start: 2019-11-29 — End: 2020-07-04

## 2019-11-29 NOTE — Progress Notes (Signed)
I called ahead and discussed the results of the PET scan with him and his wife.  I think he needs another biopsy and I have asked Dr. GERD heart and thoracic surgery to see if he can help [place] with that.  [name] is willing.  We also discussed the prostate issue.  In fact [name] is having some urinary frequency and mild dysuria.  I am going to bring him in for urinalysis and some PSA studies and then he [name] take nitrofurantoin for a week to see if symptoms improve.

## 2019-11-30 ENCOUNTER — Ambulatory Visit: Payer: Medicare HMO | Admitting: Adult Health

## 2019-12-01 ENCOUNTER — Encounter: Payer: Self-pay | Admitting: Oncology

## 2019-12-01 ENCOUNTER — Telehealth: Payer: Self-pay | Admitting: Oncology

## 2019-12-01 ENCOUNTER — Other Ambulatory Visit: Payer: Self-pay

## 2019-12-01 ENCOUNTER — Inpatient Hospital Stay: Payer: Medicare HMO | Attending: Oncology

## 2019-12-01 DIAGNOSIS — Z9221 Personal history of antineoplastic chemotherapy: Secondary | ICD-10-CM | POA: Insufficient documentation

## 2019-12-01 DIAGNOSIS — Z79899 Other long term (current) drug therapy: Secondary | ICD-10-CM | POA: Diagnosis not present

## 2019-12-01 DIAGNOSIS — I10 Essential (primary) hypertension: Secondary | ICD-10-CM | POA: Diagnosis not present

## 2019-12-01 DIAGNOSIS — R972 Elevated prostate specific antigen [PSA]: Secondary | ICD-10-CM | POA: Insufficient documentation

## 2019-12-01 DIAGNOSIS — Z8572 Personal history of non-Hodgkin lymphomas: Secondary | ICD-10-CM | POA: Diagnosis not present

## 2019-12-01 DIAGNOSIS — R300 Dysuria: Secondary | ICD-10-CM | POA: Insufficient documentation

## 2019-12-01 DIAGNOSIS — C8319 Mantle cell lymphoma, extranodal and solid organ sites: Secondary | ICD-10-CM

## 2019-12-01 DIAGNOSIS — Z7982 Long term (current) use of aspirin: Secondary | ICD-10-CM | POA: Diagnosis not present

## 2019-12-01 DIAGNOSIS — K276 Chronic or unspecified peptic ulcer, site unspecified, with both hemorrhage and perforation: Secondary | ICD-10-CM

## 2019-12-01 LAB — URINALYSIS, COMPLETE (UACMP) WITH MICROSCOPIC
Bacteria, UA: NONE SEEN
Bilirubin Urine: NEGATIVE
Glucose, UA: NEGATIVE mg/dL
Hgb urine dipstick: NEGATIVE
Ketones, ur: NEGATIVE mg/dL
Nitrite: NEGATIVE
Protein, ur: 100 mg/dL — AB
Specific Gravity, Urine: 1.021 (ref 1.005–1.030)
WBC, UA: >50 WBC/hpf — ABNORMAL HIGH (ref 0–5)
pH: 6 (ref 5.0–8.0)

## 2019-12-01 LAB — COMPREHENSIVE METABOLIC PANEL
ALT: 23 U/L (ref 0–44)
AST: 19 U/L (ref 15–41)
Albumin: 3.8 g/dL (ref 3.5–5.0)
Alkaline Phosphatase: 42 U/L (ref 38–126)
Anion gap: 8 (ref 5–15)
BUN: 19 mg/dL (ref 8–23)
CO2: 27 mmol/L (ref 22–32)
Calcium: 9.1 mg/dL (ref 8.9–10.3)
Chloride: 107 mmol/L (ref 98–111)
Creatinine, Ser: 1.19 mg/dL (ref 0.61–1.24)
GFR, Estimated: >60 mL/min (ref >60)
Glucose, Bld: 82 mg/dL (ref 70–99)
Potassium: 4.4 mmol/L (ref 3.5–5.1)
Sodium: 142 mmol/L (ref 135–145)
Total Bilirubin: 0.9 mg/dL (ref 0.3–1.2)
Total Protein: 6.6 g/dL (ref 6.5–8.1)

## 2019-12-01 LAB — CBC WITH DIFFERENTIAL/PLATELET
Abs Immature Granulocytes: 0.02 10*3/uL (ref 0.00–0.07)
Basophils Absolute: 0.1 10*3/uL (ref 0.0–0.1)
Basophils Relative: 1 %
Eosinophils Absolute: 0.1 10*3/uL (ref 0.0–0.5)
Eosinophils Relative: 3 %
HCT: 38.9 % — ABNORMAL LOW (ref 39.0–52.0)
Hemoglobin: 12.4 g/dL — ABNORMAL LOW (ref 13.0–17.0)
Immature Granulocytes: 0 %
Lymphocytes Relative: 21 %
Lymphs Abs: 1.1 10*3/uL (ref 0.7–4.0)
MCH: 27.4 pg (ref 26.0–34.0)
MCHC: 31.9 g/dL (ref 30.0–36.0)
MCV: 86.1 fL (ref 80.0–100.0)
Monocytes Absolute: 0.7 10*3/uL (ref 0.1–1.0)
Monocytes Relative: 14 %
Neutro Abs: 3 10*3/uL (ref 1.7–7.7)
Neutrophils Relative %: 61 %
Platelets: 300 10*3/uL (ref 150–400)
RBC: 4.52 MIL/uL (ref 4.22–5.81)
RDW: 14.6 % (ref 11.5–15.5)
WBC: 5 10*3/uL (ref 4.0–10.5)
nRBC: 0 % (ref 0.0–0.2)

## 2019-12-01 LAB — LACTATE DEHYDROGENASE: LDH: 202 U/L — ABNORMAL HIGH (ref 98–192)

## 2019-12-01 MED FILL — IMBRUVICA 420 MG TAB: 420 | 28 days supply | Qty: 28 | Fill #0

## 2019-12-01 NOTE — Telephone Encounter (Signed)
Scheduled appt per 10/26 sch msg - pt is aware of appt date and time   

## 2019-12-02 LAB — PSA, TOTAL AND FREE
PSA, Free Pct: 8.2 %
PSA, Free: 1.07 ng/mL
Prostate Specific Ag, Serum: 13 ng/mL — ABNORMAL HIGH (ref 0.0–4.0)

## 2019-12-03 ENCOUNTER — Other Ambulatory Visit: Payer: Self-pay | Admitting: Oncology

## 2019-12-03 LAB — URINE CULTURE: Culture: 30000 — AB

## 2019-12-03 NOTE — Progress Notes (Signed)
I called will who was at a football game so gave the information to admit his wife.  His PSA is normal.  He has an E. coli urinary tract infection.  This is sensitive to nitrofurantoin.  He will take that for 7 days and then continue daily until he runs out.  Has a referral to [facility] for lymph node biopsy.  I do not see that that has been scheduled yet but I do know that Dr. Servando Snare received the consult and requested the visit.

## 2019-12-06 ENCOUNTER — Encounter: Payer: Medicare HMO | Admitting: Cardiothoracic Surgery

## 2019-12-06 ENCOUNTER — Institutional Professional Consult (permissible substitution): Payer: Medicare HMO | Admitting: Cardiothoracic Surgery

## 2019-12-06 ENCOUNTER — Other Ambulatory Visit: Payer: Self-pay

## 2019-12-06 VITALS — BP 170/80 | HR 58 | Resp 20 | Ht 70.0 in | Wt 210.0 lb

## 2019-12-06 DIAGNOSIS — R590 Localized enlarged lymph nodes: Secondary | ICD-10-CM

## 2019-12-06 NOTE — Progress Notes (Deleted)
[address]       [place] [address]             [phone]                    [name] [place] Medical Record #[id number] Date of Birth: 01/31/1951  Referring: Magrinat, Virgie Dad, MD Primary Care: Shelda Pal, DO Primary Cardiologist: Pixie Casino, MD  Chief Complaint:    No chief complaint on file.   History of Present Illness:    [name] 69 y.o. male is seen in the office  today for consideration of mediastinal biopsy.  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  CURRENT TREATMENT: Maintenance rituximab; added ibrutinib 02/09/2017  Serial PET scans have showed progressive recurrent mediastinal involvement.        Current Activity/ Functional Status:  Patient is independent with mobility/ambulation, transfers, ADL's, IADL's.   Zubrod Score: At the time of surgery this patient's most appropriate activity status/level should be described as: [x]     0    Normal activity, no symptoms []     1    Restricted in physical strenuous activity but ambulatory, able to do out light work []     2    Ambulatory and capable of self care, unable to do work activities, up and about               >50 % of waking hours                              []     3    Only limited self care, in bed greater than 50% of waking hours []     4    Completely disabled, no self care, confined to bed or chair []     5    Moribund   Past Medical History:  Diagnosis Date  . Anemia   . Cancer ([place])    Lymphoma  . Essential hypertension   . GERD (gastroesophageal reflux disease)   . GI bleed   . HLD (hyperlipidemia)   . Weakness     Past Surgical History:  Procedure Laterality Date  . ESOPHAGOGASTRODUODENOSCOPY Left 01/18/2015   Procedure: ESOPHAGOGASTRODUODENOSCOPY (EGD);  Surgeon: Wilford Corner, MD;  Location: [facility] ENDOSCOPY;  Service:  Endoscopy;  Laterality: Left;  . INGUINAL HERNIA REPAIR Right 01/22/2015   Procedure: RIGHT INGUINAL LYMPH NODE BX;  Surgeon: Donnie Mesa, MD;  Location: [facility];  Service: General;  Laterality: Right;  . MEDIASTINOSCOPY N/A 05/05/2017   Procedure: MEDIASTINOSCOPY;  Surgeon: Grace Isaac, MD;  Location: [facility];  Service: Thoracic;  Laterality: N/A;  . PORTACATH PLACEMENT  01/26/2015    power port with tip SVC/[place]  . SKIN SURGERY     Small benign cysts over left scalp removed  . VIDEO BRONCHOSCOPY WITH ENDOBRONCHIAL ULTRASOUND N/A 05/05/2017   Procedure: VIDEO BRONCHOSCOPY WITH ENDOBRONCHIAL ULTRASOUND;  Surgeon: Grace Isaac, MD;  Location: [facility] OR;  Service: Thoracic;  Laterality: N/A;    Family History  Problem Relation Age of Onset  . Hypertension Mother   . Hypertension Father   . Kidney failure Father   . Hypertension Sister   . Diabetes Sister   . Prostate cancer Brother   . Lupus Sister  Social History   Socioeconomic History  . Marital status: Married    Spouse name: Not on file  . Number of children: Not on file  . Years of education: Not on file  . Highest education level: Not on file  Occupational History  . Occupation: Retired  Tobacco Use  . Smoking status: Never Smoker  . Smokeless tobacco: Never Used     Social History   Tobacco Use  Smoking Status Never Smoker  Smokeless Tobacco Never Used    Social History   Substance and Sexual Activity  Alcohol Use Yes   Comment: occasional beer     No Known Allergies  Current Outpatient Medications  Medication Sig Dispense Refill  . aspirin 81 MG EC tablet Take 1 tablet (81 mg total) by mouth daily. Swallow whole. 30 tablet 11  . atorvastatin (LIPITOR) 80 MG tablet Take 1 tablet (80 mg total) by mouth daily. 30 tablet 5  . carvedilol (COREG) 3.125 MG tablet Take 1 tablet (3.125 mg total) by mouth 2 (two) times daily with a meal. 180 tablet 3  . IMBRUVICA 420 MG TABS TAKE 1 TABLET BY  MOUTH DAILY. 28 tablet 0  . nitrofurantoin, macrocrystal-monohydrate, (MACROBID) 100 MG capsule Take 1 capsule (100 mg total) by mouth 2 (two) times daily. 44 capsule 0  . sacubitril-valsartan (ENTRESTO) 24-26 MG Take 1 tablet by mouth 2 (two) times daily. 60 tablet 11   No current facility-administered medications for this visit.     Review of Systems:     Cardiac Review of Systems: [Y] = yes  or   [  ] = no   Chest Pain [  ]  Resting SOB [ ]  Exertional SOB  [  ]  Orthopnea [  ]   Pedal Edema [  ]    Palpitations [  ] Syncope  [  ]   Presyncope [   ]  General Review of Systems: [Y] = yes [  ]=no Constitional: recent weight change [  ];  Wt loss over the last 3 months [   ] anorexia [  ]; fatigue Blue.Reese  ]; nausea [ n ]; night sweats [n  ]; fever [n  ]; or chills [ n ];          Dental: poor dentition[  ]; Last Dentist visit:   Eye : blurred vision [  ]; diplopia [   ]; vision changes [  ];  Amaurosis fugax[  ]; Resp: cough [  ];  wheezing[  ];  hemoptysis[  ]; shortness of breath[  ]; paroxysmal nocturnal dyspnea[  ]; dyspnea on exertion[  ]; or orthopnea[  ];  GI:  gallstones[  ], vomiting[  ];  dysphagia[  ]; melena[  ];  hematochezia [  ]; heartburn[  ];   Hx of  Colonoscopy[  ]; GU: kidney stones [  ]; hematuria[  ];   dysuria [  ];  nocturia[  ];  history of     obstruction [  ]; urinary frequency [  ]             Skin: rash, swelling[  ];, hair loss[  ];  peripheral edema[  ];  or itching[  ]; Musculosketetal: myalgias[  ];  joint swelling[  ];  joint erythema[  ];  joint pain[  ];  back pain[  ];  Heme/Lymph: bruising[  ];  bleeding[  ];  anemia[  ];  Neuro: TIA[  ];  headaches[  ];  stroke[  ];  vertigo[  ];  seizures[  ];   paresthesias[  ];  difficulty walking[  ];  Psych:depression[  ]; anxiety[  ];  Endocrine: diabetes[ ] ;  thyroid dysfunction[] ;  Immunizations: Flu up to date [  ]; Pneumococcal up to date [  ];  Other:  Physical Exam: There were no vitals taken for this  visit.  PHYSICAL EXAMINATION: {physical exam:21449}  Diagnostic Studies & Laboratory data:     Recent Radiology Findings:   NM PET Image Restag (PS) Skull Base To Thigh  Result Date: 11/29/2019 CLINICAL DATA:  Subsequent treatment strategy for mantle cell lymphoma. EXAM: NUCLEAR MEDICINE PET SKULL BASE TO THIGH TECHNIQUE: 10.5 mCi F-18 FDG was injected intravenously. Full-ring PET imaging was performed from the skull base to thigh after the radiotracer. CT data was obtained and used for attenuation correction and anatomic localization. Fasting blood glucose: 97 mg/dl COMPARISON:  Multiple exams, including 05/09/2019 FINDINGS: Mediastinal blood pool activity: SUV max 2.3 Liver activity: SUV max 3.3 NECK: Salivary glottic activity is similar to prior and likely physiologic. No hypermetabolic adenopathy identified. Incidental CT findings: Bilateral common carotid atherosclerotic calcification. CHEST: Small but hypermetabolic paratracheal, AP window, right hilar, subcarinal, and left infrahilar lymph nodes are identified. An index small right hilar lymph node has a maximum SUV of 5.7 ([place] 4), formerly 4.6 ([place] 4). Some of the lymph nodes were suppressed due to a band of lack of signal on the PET data in the chest on the prior exam, but accounting for this are probably grossly similar previous. An index small right paratracheal lymph node measures 0.7 cm in short axis on image 58 of series 4 (formerly the same) with maximum SUV of 4.7, [place] 4 (previously not measurable due to signal loss on the prior exam). Incidental CT findings: Suspected right paracentral subcutaneous lipoma along the thoracic back region for example on image 53 of series 4, no abnormal metabolic activity, overall benign appearance. Mild cardiomegaly. Coronary, aortic arch, and branch vessel atherosclerotic vascular disease. ABDOMEN/PELVIS: Multifocal accentuated activity in the mildly enlarged prostate gland, including in  the anterior left apex where the maximum SUV is 5.9. Faint stranding around the prostate gland. Nonspecific for benign versus malignant causes. Previously prostate activity in this region was less, with maximum SUV of 3.8. Linear band of density extending caudad from the coccyx in the subcutaneous tissues of the left medial buttock, medial to the gluteus maximus muscle, but tapering out in the subcutaneous tissues rather than extending to the cutaneous surface. This has a maximum SUV of 6.6 and given the unusual appearance is most likely inflammatory and less likely to be an unusual elongated lymph node. No splenomegaly or abnormal splenic activity. Incidental CT findings: Thick-walled urinary bladder is nondistended but there is also some mild perivesical stranding, cystitis is a distinct possibility. Aortoiliac atherosclerotic vascular disease. SKELETON: No significant abnormal hypermetabolic activity in this region. Incidental CT findings: Bridging spurring of the right sacroiliac joint. IMPRESSION: 1. Continued [place] 4 activity in small thoracic lymph nodes. Overall activity is similar to slightly increased compared to prior; previous assessment of thoracic nodes was limited due to a band of poor signal to noise ratio through the chest on the previous exam. 2. Newly accentuated activity in the prostate gland compared to the prior exam, with mild stranding around the prostate gland and urinary bladder. Cystitis/prostatitis not excluded, although I would suggest correlating with PSA levels in determining whether further workup for prostate cancer is indicated. 3. Unusual  thin linear band of density extending in the left medial buttock subcutaneous tissues caudad from the coccyx. This has accentuated metabolic activity and is probably inflammatory, less likely to be from a small elongated lymph node. Surveillance suggested. 4. Other imaging findings of potential clinical significance: Subcutaneous lipoma in the  right paracentral upper back. Mild cardiomegaly. Aortic Atherosclerosis (ICD10-I70.0). Coronary atherosclerosis. Electronically Signed   By: Van Clines M.D.   On: 11/29/2019 12:29   I have independently reviewed the above radiology studies  and reviewed the findings with the patient.   Recent Lab Findings: Lab Results  Component Value Date   WBC 5.0 12/01/2019   HGB 12.4 (L) 12/01/2019   HCT 38.9 (L) 12/01/2019   PLT 300 12/01/2019   GLUCOSE 82 12/01/2019   CHOL 124 10/21/2019   TRIG 84 10/21/2019   HDL 41 10/21/2019   LDLCALC 67 10/21/2019   ALT 23 12/01/2019   AST 19 12/01/2019   NA 142 12/01/2019   K 4.4 12/01/2019   CL 107 12/01/2019   CREATININE 1.19 12/01/2019   BUN 19 12/01/2019   CO2 27 12/01/2019   TSH 1.924 08/03/2019   INR 0.9 08/01/2019   HGBA1C 6.1 (H) 08/01/2019      Assessment / Plan:   Patient with previous history of non-Hodgkin's lymphoma originally diagnosed as a duodenal mass, now presents with progressive mediastinal adenopathy hypermetabolic on PET scan.  Patient is referred for consideration of mediastinal biopsy to determine the cause of the mediastinal adenopathy.  I discussed with the patient proceeding with bronchoscopy ebus, and likely mediastinoscopy to to obtain adequate to achieve tissue for evaluation for lymphoma.  Risks and options were discussed with patient and he is agreeable with proceeding.        Grace Isaac MD      [address] [place] [address] Office [phone]   [place] [phone]  12/06/2019 7:08 AM

## 2019-12-06 NOTE — Progress Notes (Signed)
[address]       [place] [address]             [phone]                    Chucky R Birman [place] Medical Record #[id number] Date of Birth: 24-Mar-1950  Referring: Magrinat, Virgie Dad, MD Primary Care: Shelda Pal, DO Primary Cardiologist: Pixie Casino, MD  Chief Complaint:    Chief Complaint  Patient presents with  . Adenopathy    Surgical consult, PET Scan 11/29/2019    History of Present Illness:    [name] 69 y.o. male is seen in the office  today for consideration of mediastinal biopsy.  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  Patient continues to have some activity and small thoracic lymph nodes,   CURRENT TREATMENT: Maintenance rituximab; added ibrutinib 02/09/2017  Recent PET scan was done-PET done recently was of poor quality but did not show activity in small thoracic lymph nodes.  In addition it was suggested that he had evidence of cystitis prostatitis and since has been treated with p.o. antibiotics/   -patient had a previous EBUS followed by mediastinoscopy because of the lack of definitive diagnosis from the EBUS-path showed evidence of sarcoidosis, there was no indication of recurrent mantle cell lymphoma.  In addition patient had a recent stroke manifest by some numbness in the left side that was transient and memory loss, he is currently anticoagulated   Cardiac CT was also done in the work-up of the stroke    Current Activity/ Functional Status:  Patient is independent with mobility/ambulation, transfers, ADL's, IADL's.   Zubrod Score: At the time of surgery this patient's most appropriate activity status/level should be described as: [x]     0    Normal activity, no symptoms []     1    Restricted in physical strenuous activity but ambulatory, able to do out light work []     2    Ambulatory and  capable of self care, unable to do work activities, up and about               >50 % of waking hours                              []     3    Only limited self care, in bed greater than 50% of waking hours []     4    Completely disabled, no self care, confined to bed or chair []     5    Moribund   Past Medical History:  Diagnosis Date  . Anemia   . Cancer ([place])    Lymphoma  . Essential hypertension   . GERD (gastroesophageal reflux disease)   . GI bleed   . HLD (hyperlipidemia)   . Weakness     Past Surgical History:  Procedure Laterality Date  . ESOPHAGOGASTRODUODENOSCOPY Left 01/18/2015   Procedure: ESOPHAGOGASTRODUODENOSCOPY (EGD);  Surgeon: Wilford Corner, MD;  Location: [facility] ENDOSCOPY;  Service: Endoscopy;  Laterality: Left;  . INGUINAL HERNIA REPAIR Right 01/22/2015   Procedure: RIGHT INGUINAL LYMPH NODE BX;  Surgeon: Donnie Mesa, MD;  Location: [facility];  Service: General;  Laterality: Right;  . MEDIASTINOSCOPY N/A 05/05/2017   Procedure: MEDIASTINOSCOPY;  Surgeon:  Grace Isaac, MD;  Location: [facility];  Service: Thoracic;  Laterality: N/A;  . PORTACATH PLACEMENT  01/26/2015    power port with tip SVC/[place]  . SKIN SURGERY     Small benign cysts over left scalp removed  . VIDEO BRONCHOSCOPY WITH ENDOBRONCHIAL ULTRASOUND N/A 05/05/2017   Procedure: VIDEO BRONCHOSCOPY WITH ENDOBRONCHIAL ULTRASOUND;  Surgeon: Grace Isaac, MD;  Location: [facility] OR;  Service: Thoracic;  Laterality: N/A;    Family History  Problem Relation Age of Onset  . Hypertension Mother   . Hypertension Father   . Kidney failure Father   . Hypertension Sister   . Diabetes Sister   . Prostate cancer Brother   . Lupus Sister     Social History   Socioeconomic History  . Marital status: Married    Spouse name: Not on file  . Number of children: Not on file  . Years of education: Not on file  . Highest education level: Not on file  Occupational History  . Occupation: Retired  Tobacco Use   . Smoking status: Never Smoker  . Smokeless tobacco: Never Used     Social History   Tobacco Use  Smoking Status Never Smoker  Smokeless Tobacco Never Used    Social History   Substance and Sexual Activity  Alcohol Use Yes   Comment: occasional beer     No Known Allergies  Current Outpatient Medications  Medication Sig Dispense Refill  . aspirin 81 MG EC tablet Take 1 tablet (81 mg total) by mouth daily. Swallow whole. 30 tablet 11  . atorvastatin (LIPITOR) 80 MG tablet Take 1 tablet (80 mg total) by mouth daily. 30 tablet 5  . carvedilol (COREG) 3.125 MG tablet Take 1 tablet (3.125 mg total) by mouth 2 (two) times daily with a meal. 180 tablet 3  . IMBRUVICA 420 MG TABS TAKE 1 TABLET BY MOUTH DAILY. 28 tablet 0  . nitrofurantoin, macrocrystal-monohydrate, (MACROBID) 100 MG capsule Take 1 capsule (100 mg total) by mouth 2 (two) times daily. 44 capsule 0  . sacubitril-valsartan (ENTRESTO) 24-26 MG Take 1 tablet by mouth 2 (two) times daily. 60 tablet 11   No current facility-administered medications for this visit.     Review of Systems:     Cardiac Review of Systems: [Y] = yes  or   [  ] = no   Chest Pain [ n ]  Resting SOB [n ] Exertional SOB  [  n]  Orthopnea Florencio.Farrier  ]   Pedal Edema Florencio.Farrier  ]    Palpitations Florencio.Farrier ] Syncope  Florencio.Farrier  ]   Presyncope [ n  ]  General Review of Systems: [Y] = yes [  ]=no Constitional: recent weight change [  ];  Wt loss over the last 3 months [   ] anorexia [  ]; fatigue Blue.Reese  ]; nausea [ n ]; night sweats [n  ]; fever [n  ]; or chills [ n ];          Dental: poor dentition[  ]; Last Dentist visit:   Eye : blurred vision [  ]; diplopia [   ]; vision changes [  ];  Amaurosis fugax[  ]; Resp: cough [  ];  wheezing[  ];  hemoptysis[  ]; shortness of breath[  ]; paroxysmal nocturnal dyspnea[  ]; dyspnea on exertion[  ]; or orthopnea[  ];  GI:  gallstones[  ], vomiting[  ];  dysphagia[  ]; melena[  ];  hematochezia [  ]; heartburn[  ];   Hx of  Colonoscopy[   ]; GU: kidney stones [  ]; hematuria[  ];   dysuria [  ];  nocturia[  ];  history of     obstruction [  ]; urinary frequency [  ]             Skin: rash, swelling[  ];, hair loss[  ];  peripheral edema[  ];  or itching[  ]; Musculosketetal: myalgias[  ];  joint swelling[  ];  joint erythema[  ];  joint pain[  ];  back pain[  ];  Heme/Lymph: bruising[  ];  bleeding[  ];  anemia[  ];  Neuro: TIA[  ];  headaches[  ];  stroke[  ];  vertigo[  ];  seizures[  ];   paresthesias[  ];  difficulty walking[  ]; memory problems   Psych:depression[  ]; anxiety[  ];  Endocrine: diabetes[ ] ;  thyroid dysfunction[] ;  Immunizations: Flu up to date [  ]; Pneumococcal up to date [  ];  Other:  Physical Exam: BP (!) 170/80   Pulse (!) 58   Resp 20   Ht 5\' 10"  (1.778 m)   Wt 210 lb (95.3 kg)   SpO2 98% Comment: RA  BMI 30.13 kg/m   PHYSICAL EXAMINATION: General appearance: alert and no distress Head: Normocephalic, without obvious abnormality, atraumatic Neck: no adenopathy, no carotid bruit, no JVD, supple, symmetrical, trachea midline and thyroid not enlarged, symmetric, no tenderness/mass/nodules Lymph nodes: Cervical, supraclavicular, and axillary nodes normal. Resp: clear to auscultation bilaterally Cardio: regular rate and rhythm, S1, S2 normal, no murmur, click, rub or gallop GI: soft, non-tender; bowel sounds normal; no masses,  no organomegaly Extremities: extremities normal, atraumatic, no cyanosis or edema and Homans sign is negative, no sign of DVT Neurologic: Grossly normal  Diagnostic Studies & Laboratory data:     Recent Radiology Findings:   NM PET Image Restag (PS) Skull Base To Thigh  Result Date: 11/29/2019 CLINICAL DATA:  Subsequent treatment strategy for mantle cell lymphoma. EXAM: NUCLEAR MEDICINE PET SKULL BASE TO THIGH TECHNIQUE: 10.5 mCi F-18 FDG was injected intravenously. Full-ring PET imaging was performed from the skull base to thigh after the radiotracer. CT data was  obtained and used for attenuation correction and anatomic localization. Fasting blood glucose: 97 mg/dl COMPARISON:  Multiple exams, including 05/09/2019 FINDINGS: Mediastinal blood pool activity: SUV max 2.3 Liver activity: SUV max 3.3 NECK: Salivary glottic activity is similar to prior and likely physiologic. No hypermetabolic adenopathy identified. Incidental CT findings: Bilateral common carotid atherosclerotic calcification. CHEST: Small but hypermetabolic paratracheal, AP window, right hilar, subcarinal, and left infrahilar lymph nodes are identified. An index small right hilar lymph node has a maximum SUV of 5.7 (Deauville 4), formerly 4.6 (Deauville 4). Some of the lymph nodes were suppressed due to a band of lack of signal on the PET data in the chest on the prior exam, but accounting for this are probably grossly similar previous. An index small right paratracheal lymph node measures 0.7 cm in short axis on image 58 of series 4 (formerly the same) with maximum SUV of 4.7, Deauville 4 (previously not measurable due to signal loss on the prior exam). Incidental CT findings: Suspected right paracentral subcutaneous lipoma along the thoracic back region for example on image 53 of series 4, no abnormal metabolic activity, overall benign appearance. Mild cardiomegaly. Coronary, aortic arch, and branch vessel atherosclerotic vascular disease. ABDOMEN/PELVIS: Multifocal accentuated activity in  the mildly enlarged prostate gland, including in the anterior left apex where the maximum SUV is 5.9. Faint stranding around the prostate gland. Nonspecific for benign versus malignant causes. Previously prostate activity in this region was less, with maximum SUV of 3.8. Linear band of density extending caudad from the coccyx in the subcutaneous tissues of the left medial buttock, medial to the gluteus maximus muscle, but tapering out in the subcutaneous tissues rather than extending to the cutaneous surface. This has a  maximum SUV of 6.6 and given the unusual appearance is most likely inflammatory and less likely to be an unusual elongated lymph node. No splenomegaly or abnormal splenic activity. Incidental CT findings: Thick-walled urinary bladder is nondistended but there is also some mild perivesical stranding, cystitis is a distinct possibility. Aortoiliac atherosclerotic vascular disease. SKELETON: No significant abnormal hypermetabolic activity in this region. Incidental CT findings: Bridging spurring of the right sacroiliac joint. IMPRESSION: 1. Continued Deauville 4 activity in small thoracic lymph nodes. Overall activity is similar to slightly increased compared to prior; previous assessment of thoracic nodes was limited due to a band of poor signal to noise ratio through the chest on the previous exam. 2. Newly accentuated activity in the prostate gland compared to the prior exam, with mild stranding around the prostate gland and urinary bladder. Cystitis/prostatitis not excluded, although I would suggest correlating with PSA levels in determining whether further workup for prostate cancer is indicated. 3. Unusual thin linear band of density extending in the left medial buttock subcutaneous tissues caudad from the coccyx. This has accentuated metabolic activity and is probably inflammatory, less likely to be from a small elongated lymph node. Surveillance suggested. 4. Other imaging findings of potential clinical significance: Subcutaneous lipoma in the right paracentral upper back. Mild cardiomegaly. Aortic Atherosclerosis (ICD10-I70.0). Coronary atherosclerosis. Electronically Signed   By: Van Clines M.D.   On: 11/29/2019 12:29   I have independently reviewed the above radiology studies  and reviewed the findings with the patient.   Recent Lab Findings: Lab Results  Component Value Date   WBC 5.0 12/01/2019   HGB 12.4 (L) 12/01/2019   HCT 38.9 (L) 12/01/2019   PLT 300 12/01/2019   GLUCOSE 82  12/01/2019   CHOL 124 10/21/2019   TRIG 84 10/21/2019   HDL 41 10/21/2019   LDLCALC 67 10/21/2019   ALT 23 12/01/2019   AST 19 12/01/2019   NA 142 12/01/2019   K 4.4 12/01/2019   CL 107 12/01/2019   CREATININE 1.19 12/01/2019   BUN 19 12/01/2019   CO2 27 12/01/2019   TSH 1.924 08/03/2019   INR 0.9 08/01/2019   HGBA1C 6.1 (H) 08/01/2019      Assessment / Plan:   Patient with previous history of non-Hodgkin's lymphoma originally diagnosed as a duodenal mass, presented to 1/2 years ago with with progressive mediastinal adenopathy hypermetabolic on PET scan.  EBUS and ultimately mediastinoscopy was performed showing evidence of sarcoidosis.  Continued follow-up PET scans primary.  Patient comes in today to discuss the options of repeat needle biopsy, however the degree of activity in size of nodes is nothing compared to original presentation  Two 1/2 years ago.  On review of the films especially the most recent PET scan it would be very difficult to obtain in a satisfactory manner lymph node tissue through EBUS for repeat mediastinoscopy.  Continued follow-up until symptom becomes more obvious to biopsy appears to be the only solution   Grace Isaac MD  [address] [place] [address] Office [phone]   [place]  12/06/2019 1:39 PM

## 2019-12-14 ENCOUNTER — Telehealth: Payer: Self-pay | Admitting: Oncology

## 2019-12-14 NOTE — Telephone Encounter (Signed)
Contacted patient to verify mychart video visit for pre reg 

## 2019-12-14 NOTE — Progress Notes (Signed)
[place]  [phone] Fax:(336) [phone]    ID: [name] DOB: 1950-08-21  MR#: [id number]  NTI#:[id number]  Patient Care Team: Shelda Pal, DO as PCP - General (Family Medicine) Debara Pickett Nadean Corwin, MD as PCP - Cardiology (Cardiology) Colum Colt, Virgie Dad, MD as Consulting Physician (Oncology) Donnie Mesa, MD as Consulting Physician (General Surgery) Wilford Corner, MD as Consulting Physician (Gastroenterology) Ples Specter, MD as Referring Physician (Internal Medicine) Larina Earthly, MD as Referring Physician (Hematology and Oncology) Grace Isaac, MD as Consulting Physician (Cardiothoracic Surgery) OTHER MD: Napoleon Form MD; Dr Glennon Mac [phone]  I connected with Barnett Hatter on 12/15/19 at 11:30 AM EST by video enabled telemedicine visit and verified that I am speaking with the correct person using two identifiers.   I discussed the limitations, risks, security and privacy concerns of performing an evaluation and management service by telemedicine and the availability of in-person appointments. I also discussed with the patient that there may be a patient responsible charge related to this service. The patient expressed understanding and agreed to proceed.   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  He is not having bruising or other side effects related to this medication that he is aware of.  Since his last visit, he underwent restaging PET scan on 11/29/2019 showing: continued activity in small  thoracic lymph nodes, overall similar to slightly increased compared to prior; newly accentuated activity in prostate gland compared to prior exam; unusual thin linear band of density extending in left medial buttock subcutaneous tissues caudad from coccyx, with accentuated metabolic activity and is probably inflammatory.  He proceeded to lab work and urinalysis on 12/01/2019. PSA showed elevation to 13, but he was also found to have an E.coli urinary tract infection. It was noted to be sensitive to nitrofurantoin, which was prescribed for him on 12/03/2019.  He took the medication twice daily for 1 week and now continues on daily doses to complete 30 additional days  He was referred to Dr. Servando Snare to discuss the small thoracic lymph node. Per Dr. Everrett Coombe note, "the degree of activity and size of nodes is nothing compared to original presentation 2 1/2 years ago. On review of the films especially the most recent PET scan it would be very difficult to obtain in a satisfactory manner lymph node tissue through EBUS for repeat mediastinoscopy."    REVIEW OF SYSTEMS: [name] feels fine, has had no fever, no drenching sweats, no weight loss, and no unexplained fatigue.  In fact he continues to exercise regularly.  He has had no unusual headaches visual changes balance issues or falls.  He denies cough phlegm production or pleurisy.  A detailed review of systems was otherwise stable.   COVID 19 VACCINATION STATUS: s/p Moderna x2 most recently March 2021   HISTORY OF PRESENT ILLNESS: From the original intake note:   "[name]" was in his usual good health until early December 2016, when he had a brief syncopal episode. He "blew this off" and it did not happen again until 01/17/2015 when he felt weak at work and later fainted  at home. He was brought to the ED where he was found to be guaiac positive (denies melena before that date) with a Hb of 4.5 MCV 82.2, platelets 295K, WBC 9.1 and normal INR and PTT. He was  admitted and underwent EGD under Dr Michail Sermon 01/18/2015 showing a very large duodenal ulcer w clot. Abdominal US 01/19/2015 was not very informative but CT abd/pelvis 12/17,2016 showed adenopathy in the anterior mediastinum, retroperitoneum and pelvis, with a large ulcerated mass involving the distal stomach/proximal duodenum and extending into the gastrohepatic region.   On 01/22/2015 he underwent right inguinal lymph node biopsy, consistent with mantle cell lymphoma (([id number] and [id number])). Specifically the lymph node showed effacement of the architecture by sheets of small to medium sized lymphocytes with no apparent nodularity. The cells were positive for CD20, CD5, BCL-2, CD21, and cyclin D1. He 67 was low (10-20%). CD21 revealed a few residual germinal centers area at the B cells were negative for CD10, and BCL-6. CD23 was negative.  On 02/06/2015 the patient underwent a bone marrow aspirate and biopsy. The pathology from this procedure (FZB 17-2 and 17-7) was positive for involvement by non-Hodgkin's lymphoma with numerous atypical interstitial and paratrabecular lymphoid aggregates consistent with mantle cell lymphoma. Iron stain showed scant iron flow cytometry confirmed a monoclonal B-cell population, lambda restricted, consistent with the patient's known diagnosis of mantle cell lymphoma.  On 01/18/2015 the patient underwent EGD which found a massive duodenal bulb ulcer with adherent clot, although no active bleeding.his hemoglobin was in the 8.4 range and stable. He was discharged on 01/22/2015, but within 24 hours was readmitted with syncope and altered mental status. He had aspirated and required intubation for his respiratory failure. Hemoglobin was down to 6.3. An NG tube was placed, the patient was multiply transfused, and Surgery was considered but felt not to be feasible.accordingly the patient underwent arteriography 01/23/2015 which found the superior mesenteric and celiac arteries  not to be the involved vessels. The gastroduodenal artery showed multiple branches providing supply to the hypervascular duodenal mass, and this artery was coil embolized.. Also a port was placed at the time of this procedure with a view to eventual chemotherapy.  He was discharged 01/26/2015,but required readmission 02/07/2015 for another presyncopal episode. He presented to the emergency room where he was found to have a hemoglobin of 7.5. The patient was transfusedIIA hemoglobin of 10 and was discharged 02/07/2014. He was scheduled to start CHOP right toxin 02/08/2014.  His subsequent history is as detailed below.   PAST MEDICAL HISTORY: Past Medical History:  Diagnosis Date  . Anemia   . Cancer ([place])    Lymphoma  . Essential hypertension   . GERD (gastroesophageal reflux disease)   . GI bleed   . HLD (hyperlipidemia)   . Weakness     PAST SURGICAL HISTORY: Past Surgical History:  Procedure Laterality Date  . ESOPHAGOGASTRODUODENOSCOPY Left 01/18/2015   Procedure: ESOPHAGOGASTRODUODENOSCOPY (EGD);  Surgeon: Wilford Corner, MD;  Location: [facility] ENDOSCOPY;  Service: Endoscopy;  Laterality: Left;  . INGUINAL HERNIA REPAIR Right 01/22/2015   Procedure: RIGHT INGUINAL LYMPH NODE BX;  Surgeon: Donnie Mesa, MD;  Location: [facility];  Service: General;  Laterality: Right;  . MEDIASTINOSCOPY N/A 05/05/2017   Procedure: MEDIASTINOSCOPY;  Surgeon: Grace Isaac, MD;  Location: [facility];  Service: Thoracic;  Laterality: N/A;  . PORTACATH PLACEMENT  01/26/2015    power port with tip SVC/RA Junction  . SKIN SURGERY     Small benign cysts over left scalp  removed  . VIDEO BRONCHOSCOPY WITH ENDOBRONCHIAL ULTRASOUND N/A 05/05/2017   Procedure: VIDEO BRONCHOSCOPY WITH ENDOBRONCHIAL ULTRASOUND;  Surgeon: Grace Isaac, MD;  Location: [facility] OR;  Service: Thoracic;  Laterality: N/A;    FAMILY HISTORY Family History  Problem Relation Age of Onset  . Hypertension Mother   . Hypertension Father   .  Kidney failure Father   . Hypertension Sister   . Diabetes Sister   . Prostate cancer Brother   . Lupus Sister    The patient's father died from CHF complications age 9. The patient's mother is 57 y/o as of December 2016. The patient has 9 brothers, 8 sisters. One brother has prostate cancer. There is no other cancer history in the family to his knowledge.   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  He retired in 2020.  His wife of 42+ years, [name], is disabled due to RA. Daughter, [name] works as a Teacher, early years/pre for [employer] (she previously worked at the [employer] in records). Son, [name] owns a window washing business. Son, [name] is in Press photographer at [employer]. All live in [place]. The patient has 3 grandchildren. He attends a [employer].    ADVANCED DIRECTIVES: Wife, [name], is automatically his HCPOA   HEALTH MAINTENANCE: Social History   Tobacco Use  . Smoking status: Never Smoker  . Smokeless tobacco: Never Used  Vaping Use  . Vaping Use: Never used  Substance Use Topics  . Alcohol use: Yes    Comment: occasional beer  . Drug use: No    Colonoscopy:  PSA: 2.27 on 01/22/2015  Hepatitis serologies (B surface antigen, hepatitis B core antibody and hepatitis C IgM) negative 01/18/2015  No Known Allergies  Current Outpatient Medications  Medication Sig Dispense Refill  . aspirin 81 MG EC tablet Take 1 tablet (81 mg total) by mouth daily. Swallow whole. 30 tablet 11  . atorvastatin (LIPITOR) 80 MG tablet Take 1 tablet (80 mg total) by mouth daily. 30 tablet 5  . carvedilol (COREG) 3.125 MG tablet Take 1 tablet (3.125 mg total) by mouth 2 (two) times daily with a meal. 180 tablet 3  . IMBRUVICA 420 MG TABS TAKE 1 TABLET BY MOUTH DAILY. 28 tablet 0  . nitrofurantoin, macrocrystal-monohydrate, (MACROBID) 100 MG capsule Take 1 capsule (100 mg total) by mouth 2 (two) times daily. 44 capsule 0  .  sacubitril-valsartan (ENTRESTO) 24-26 MG Take 1 tablet by mouth 2 (two) times daily. 60 tablet 11   No current facility-administered medications for this visit.    OBJECTIVE: African-American man who appears younger than stated age There were no vitals filed for this visit. Wt Readings from Last 3 Encounters:  12/06/19 210 lb (95.3 kg)  10/27/19 210 lb 4.8 oz (95.4 kg)  09/29/19 208 lb (94.3 kg)   There is no height or weight on file to calculate BMI.    ECOG FS:1 - Symptomatic but completely ambulatory  Telemedicine visit 12/15/2019   LAB RESULTS:  CMP     Component Value Date/Time   NA 142 12/01/2019 1111   NA 142 10/21/2019 0904   NA 140 01/16/2017 0837   K 4.4 12/01/2019 1111   K 3.9 01/16/2017 0837   CL 107 12/01/2019 1111   CO2 27 12/01/2019 1111   CO2 24 01/16/2017 0837   GLUCOSE 82 12/01/2019 1111   GLUCOSE 102 01/16/2017 0837   BUN 19 12/01/2019 1111  BUN 17 10/21/2019 0904   BUN 20.4 01/16/2017 0837   CREATININE 1.19 12/01/2019 1111   CREATININE 1.2 01/16/2017 0837   CALCIUM 9.1 12/01/2019 1111   CALCIUM 8.9 01/16/2017 0837   PROT 6.6 12/01/2019 1111   PROT 6.6 10/21/2019 0904   PROT 6.9 01/16/2017 0837   ALBUMIN 3.8 12/01/2019 1111   ALBUMIN 4.4 10/21/2019 0904   ALBUMIN 4.0 01/16/2017 0837   AST 19 12/01/2019 1111   AST 20 01/16/2017 0837   ALT 23 12/01/2019 1111   ALT 22 01/16/2017 0837   ALKPHOS 42 12/01/2019 1111   ALKPHOS 47 01/16/2017 0837   BILITOT 0.9 12/01/2019 1111   BILITOT 0.7 10/21/2019 0904   BILITOT 0.36 01/16/2017 0837   GFRNONAA >60 12/01/2019 1111   GFRAA >60 10/21/2019 1028    No results found for: TOTALPROTELP, ALBUMINELP, A1GS, A2GS, BETS, BETA2SER, GAMS, MSPIKE, SPEI  Lab Results  Component Value Date   WBC 5.0 12/01/2019   NEUTROABS 3.0 12/01/2019   HGB 12.4 (L) 12/01/2019   HCT 38.9 (L) 12/01/2019   MCV 86.1 12/01/2019   PLT 300 12/01/2019    No results found for: LABCA2  No components found for:  NIOEVO350  No results for input(s): INR in the last 168 hours.  No results found for: LABCA2  No results found for: KXF818  No results found for: EXH371  No results found for: IRC789  No results found for: CA2729  No components found for: HGQUANT  No results found for: CEA1 / No results found for: CEA1   No results found for: AFPTUMOR  No results found for: CHROMOGRNA  No results found for: KPAFRELGTCHN, LAMBDASER, KAPLAMBRATIO (kappa/lambda light chains)  No results found for: HGBA, HGBA2QUANT, HGBFQUANT, HGBSQUAN (Hemoglobinopathy evaluation)   Lab Results  Component Value Date   LDH 202 (H) 12/01/2019    Lab Results  Component Value Date   IRON 115 02/16/2015   TIBC 345 02/16/2015   IRONPCTSAT 33 02/16/2015   (Iron and TIBC)  Lab Results  Component Value Date   FERRITIN 85 02/09/2015    Urinalysis    Component Value Date/Time   COLORURINE YELLOW 12/01/2019 1130   APPEARANCEUR CLOUDY (A) 12/01/2019 1130   LABSPEC 1.021 12/01/2019 1130   PHURINE 6.0 12/01/2019 1130   GLUCOSEU NEGATIVE 12/01/2019 1130   HGBUR NEGATIVE 12/01/2019 1130   BILIRUBINUR NEGATIVE 12/01/2019 1130   KETONESUR NEGATIVE 12/01/2019 1130   PROTEINUR 100 (A) 12/01/2019 1130   NITRITE NEGATIVE 12/01/2019 1130   LEUKOCYTESUR LARGE (A) 12/01/2019 1130    STUDIES: NM PET Image Restag (PS) Skull Base To Thigh  Result Date: 11/29/2019 CLINICAL DATA:  Subsequent treatment strategy for mantle cell lymphoma. EXAM: NUCLEAR MEDICINE PET SKULL BASE TO THIGH TECHNIQUE: 10.5 mCi F-18 FDG was injected intravenously. Full-ring PET imaging was performed from the skull base to thigh after the radiotracer. CT data was obtained and used for attenuation correction and anatomic localization. Fasting blood glucose: 97 mg/dl COMPARISON:  Multiple exams, including 05/09/2019 FINDINGS: Mediastinal blood pool activity: SUV max 2.3 Liver activity: SUV max 3.3 NECK: Salivary glottic activity is similar to  prior and likely physiologic. No hypermetabolic adenopathy identified. Incidental CT findings: Bilateral common carotid atherosclerotic calcification. CHEST: Small but hypermetabolic paratracheal, AP window, right hilar, subcarinal, and left infrahilar lymph nodes are identified. An index small right hilar lymph node has a maximum SUV of 5.7 (Deauville 4), formerly 4.6 (Deauville 4). Some of the lymph nodes were suppressed due to a band of lack  of signal on the PET data in the chest on the prior exam, but accounting for this are probably grossly similar previous. An index small right paratracheal lymph node measures 0.7 cm in short axis on image 58 of series 4 (formerly the same) with maximum SUV of 4.7, Deauville 4 (previously not measurable due to signal loss on the prior exam). Incidental CT findings: Suspected right paracentral subcutaneous lipoma along the thoracic back region for example on image 53 of series 4, no abnormal metabolic activity, overall benign appearance. Mild cardiomegaly. Coronary, aortic arch, and branch vessel atherosclerotic vascular disease. ABDOMEN/PELVIS: Multifocal accentuated activity in the mildly enlarged prostate gland, including in the anterior left apex where the maximum SUV is 5.9. Faint stranding around the prostate gland. Nonspecific for benign versus malignant causes. Previously prostate activity in this region was less, with maximum SUV of 3.8. Linear band of density extending caudad from the coccyx in the subcutaneous tissues of the left medial buttock, medial to the gluteus maximus muscle, but tapering out in the subcutaneous tissues rather than extending to the cutaneous surface. This has a maximum SUV of 6.6 and given the unusual appearance is most likely inflammatory and less likely to be an unusual elongated lymph node. No splenomegaly or abnormal splenic activity. Incidental CT findings: Thick-walled urinary bladder is nondistended but there is also some mild  perivesical stranding, cystitis is a distinct possibility. Aortoiliac atherosclerotic vascular disease. SKELETON: No significant abnormal hypermetabolic activity in this region. Incidental CT findings: Bridging spurring of the right sacroiliac joint. IMPRESSION: 1. Continued Deauville 4 activity in small thoracic lymph nodes. Overall activity is similar to slightly increased compared to prior; previous assessment of thoracic nodes was limited due to a band of poor signal to noise ratio through the chest on the previous exam. 2. Newly accentuated activity in the prostate gland compared to the prior exam, with mild stranding around the prostate gland and urinary bladder. Cystitis/prostatitis not excluded, although I would suggest correlating with PSA levels in determining whether further workup for prostate cancer is indicated. 3. Unusual thin linear band of density extending in the left medial buttock subcutaneous tissues caudad from the coccyx. This has accentuated metabolic activity and is probably inflammatory, less likely to be from a small elongated lymph node. Surveillance suggested. 4. Other imaging findings of potential clinical significance: Subcutaneous lipoma in the right paracentral upper back. Mild cardiomegaly. Aortic Atherosclerosis (ICD10-I70.0). Coronary atherosclerosis. Electronically Signed   By: Van Clines M.D.   On: 11/29/2019 12:29      ASSESSMENT: 69 y.o. Pink man with a diagnosis of mantle cell non-Hodgkin's lymphoma of extranodal and solid organ sites presenting with syncope secondary to bleeding from a large ulcerated duodenal ulcer  (a) s/p coil embolization of the feeding (gastro-duodenal) artery 01/23/2015  (b) anemia--scant iron stores on bone marrow biopsy--s/p feraheme 02/13/2015  (1) right inguinal lymph node biopsy 01/22/2015 confirms mantle cell lymphoma  (a) bone marrow biopsy 02/06/2015 positive for involvement by the patient's mantle cell lymphoma  (b) IPI  score of 5 (high risk) predicts a 5 year progression free survival of 50% with CHOP-Rituxan chemotherapy  (c) MIPI score of 5 (intermediate risk) predicts a median survival of 58 months  (d) the cells were CD5 positive, CD19 and 20+, CD23 negative, lambda restricted  (2) CHOP/Rituxan started 02/09/2015, completed 8 cycles 07/05/2015  (3)  [facility] consultation 04/06/2015. Patient opted against transplant consolidation  (4) rituximab maintenance started 08/16/2015, repeat every 2 months, discontinued February 2019  (a) PET  scan had shown a suggestion of recurrence, but see (6) below  (5) PSA increase noted July 2017, back to baseline by December 2017  (6) restaging PET scan 11/17/2016 is consistent with disease progression  (a) repeat PET scan 02/02/2017 confirms increase in lymph nodes  (b) PET scan 04/20/2017 shows increasing hypermetabolic adenopathy  (c) bronchoscopy/mediastinoscopy 05/05/2017 shows granulomatous disease, noncaseating, with negative AFB/ GMS stains; <10% of lymphocytes are B-cell with slight lambda excess  (d) PET scan on 07/06/2017 shows mild increase in mediastinal lymphadenopathy. Increased hypermetabolic supraclavicular lymph node.  (e) PET scan 12/24/2017 interpreted as showing evidence of partial response   (f) PET 09/21/2018 shows decreased size and metabolism of scattered measurable nodes; Deauville 4  (g) PET scan 11/29/2019 essentially unchanged except for increased prostate activity (see #10 below).  (h) thoracic surgery (Dr. 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  Intracranial MRA was negative.  (b) aspirin 81 mg started July 2021  (10) E. coli urinary tract infection diagnosed 12/01/2019, sensitive to nitrofurantoin  (a) PSA elevated to 13.0 on 12/01/2019   PLAN: [name] is now just about 5 years out from definitive diagnosis of mantle cell lymphoma.  He continues on ibrutinib which she tolerates well.  Most recent scan shows some scattered lymph nodes which may represent sarcoid instead of lymphoma.  Unfortunately biopsy of these very small nodes would be to invasive and we [name] continue to follow clinically.  His wife was concerned about his high PSA.  I reassured her that in the setting of infection this is expected.  We are going to recheck the PSA in January and March and I expect those numbers to move towards normal.  I would consider repeating a PET scan around May or so, but again right now he is having no symptoms related to his lymphoma or for that matter his sarcoid if that is what the small lymph nodes represent   Jannelle Notaro, Virgie Dad, MD  12/15/19 11:00 AM Medical Oncology and Hematology [facility] [place],  [address] Tel. [phone]    Fax. [phone]   I, Wilburn Mylar, am acting as scribe for Dr. Virgie Dad. Anyiah Coverdale.  I, Lurline Del MD, have reviewed the above documentation for accuracy and completeness, and I agree with the above.   *Total Encounter Time as defined by the Centers for Medicare and Medicaid Services includes, in addition to the face-to-face time of a patient visit (documented in the note above) non-face-to-face time: obtaining and reviewing outside history, ordering and reviewing medications, tests or procedures, care coordination (communications with other health  care professionals or caregivers) and documentation in the medical record.

## 2019-12-15 ENCOUNTER — Other Ambulatory Visit: Payer: Self-pay | Admitting: Oncology

## 2019-12-15 ENCOUNTER — Inpatient Hospital Stay (HOSPITAL_BASED_OUTPATIENT_CLINIC_OR_DEPARTMENT_OTHER): Payer: Medicare HMO | Admitting: Oncology

## 2019-12-15 DIAGNOSIS — Z8572 Personal history of non-Hodgkin lymphomas: Secondary | ICD-10-CM | POA: Insufficient documentation

## 2019-12-15 DIAGNOSIS — C8319 Mantle cell lymphoma, extranodal and solid organ sites: Secondary | ICD-10-CM

## 2019-12-15 DIAGNOSIS — K276 Chronic or unspecified peptic ulcer, site unspecified, with both hemorrhage and perforation: Secondary | ICD-10-CM | POA: Diagnosis not present

## 2019-12-15 DIAGNOSIS — I6322 Cerebral infarction due to unspecified occlusion or stenosis of basilar arteries: Secondary | ICD-10-CM | POA: Diagnosis not present

## 2019-12-15 MED ORDER — IMBRUVICA 420 MG PO TABS: 1.0000 | ORAL_TABLET | Freq: Every day | ORAL | 0 refills | Status: DC

## 2019-12-26 MED FILL — IMBRUVICA 420 MG TAB: 420 | 28 days supply | Qty: 28 | Fill #0

## 2020-01-02 ENCOUNTER — Ambulatory Visit: Payer: Medicare HMO | Admitting: Internal Medicine

## 2020-01-02 ENCOUNTER — Other Ambulatory Visit: Payer: Self-pay

## 2020-01-02 ENCOUNTER — Encounter: Payer: Self-pay | Admitting: Internal Medicine

## 2020-01-02 VITALS — BP 132/80 | HR 65 | Temp 97.8°F | Ht 70.0 in | Wt 208.0 lb

## 2020-01-02 DIAGNOSIS — E782 Mixed hyperlipidemia: Secondary | ICD-10-CM

## 2020-01-02 DIAGNOSIS — I428 Other cardiomyopathies: Secondary | ICD-10-CM | POA: Diagnosis not present

## 2020-01-02 DIAGNOSIS — I10 Essential (primary) hypertension: Secondary | ICD-10-CM

## 2020-01-02 DIAGNOSIS — Z79899 Other long term (current) drug therapy: Secondary | ICD-10-CM | POA: Diagnosis not present

## 2020-01-02 MED ORDER — SACUBITRIL-VALSARTAN 49-51 MG PO TABS: 1.0000 | ORAL_TABLET | Freq: Two times a day (BID) | ORAL | 11 refills | Status: DC

## 2020-01-02 NOTE — Patient Instructions (Addendum)
Medication Instructions:  INCREASE entresto to 49/51mg  twice daily  *If you need a refill on your cardiac medications before your next appointment, please call your pharmacy*   Lab Work: BMET - due one week after entresto dose increase If you have labs (blood work) drawn today and your tests are completely normal, you will receive your results only by: Marland Kitchen MyChart Message (if you have MyChart) OR . A paper copy in the mail If you have any lab test that is abnormal or we need to change your treatment, we will call you to review the results.   Testing/Procedures: Echocardiogram in 6 months (prior to next visit with Dr. Debara Pickett)   Follow-Up: At [facility], you and your health needs are our priority.  As part of our continuing mission to provide you with exceptional heart care, we have created designated Provider Care Teams.  These Care Teams include your primary Cardiologist (physician) and Advanced Practice Providers (APPs -  Physician Assistants and Nurse Practitioners) who all work together to provide you with the care you need, when you need it.  We recommend signing up for the patient portal called "MyChart".  Sign up information is provided on this After Visit Summary.  MyChart is used to connect with patients for Virtual Visits (Telemedicine).  Patients are able to view lab/test results, encounter notes, upcoming appointments, etc.  Non-urgent messages can be sent to your provider as well.   To learn more about what you can do with MyChart, go to [email or web address].    Your next appointment:   6 month(s)  The format for your next appointment:   In Person  Provider:   You may see Pixie Casino, MD or one of the following Advanced Practice Providers on your designated Care Team:    Almyra Deforest, PA-C  Fabian Sharp, PA-C or   Roby Lofts, [place]    Other Instructions

## 2020-01-02 NOTE — Progress Notes (Signed)
OFFICE NOTE  Chief Complaint:  Follow-up  Primary Care Physician: Shelda Pal, DO  HPI:  [name] is a 69 y.o. male with a past medial history significant for non-Hodgkin's lymphoma who presented to the emergency room 08/01/2019 with balance issues.  Patient said he had done his usual workout and was doing some work around the house when he noticed his balance was "off".  He also developed some left arm pain.  He had not had this before.  He went to the emergency room.  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  Carotid Dopplers revealed a 1 to 39% right internal carotid artery narrowing and a 60 to 79% left internal carotid artery narrowing.  Echocardiogram revealed cardiomyopathy with an EF of 35 to 40% with global hypokinesis.  He also had grade 1 diastolic dysfunction, moderate left atrial enlargement.  He was noted to have frequent ectopy with PACs and PVCs.  The hospital notes and discharge summary mention atrial fibrillation, this was not mentioned by Dr. Debara Pickett and I could find no evidence of atrial fibrillation by his EKGs.  He did have frequent ectopy.  The patient was placed on aspirin and Plavix although today he tells me he has not been taking aspirin.  His Mevacor was changed to Lipitor 80 mg. His lisinopril HCTZ was changed to Entresto.  Dr. Lysbeth Penner note also mentions adding low-dose carvedilol but this was not added at discharge.  The plan is for outpatient ischemic evaluation with a coronary CT scan.  Since discharge the patient says he is done well.  The patient is an avid exerciser and goes to the gym.  He used to be a runner but now just walks daily for exercise.  I asked him to back off on his weight lifting and push-ups.  I suggested he could do low weights, 10 pounds or so with high reps.  I suggested he continue walking for exercise as tolerated.  He and his wife have gone on a no sodium diet. He denies any LE edema or orthopnea.    09/29/2019  [name] is seen today in follow-up.  He was seen by Kerin Ransom, PA-C for hospital follow-up.  I saw him first in the hospital.  We were trying to work-up his cardiomyopathy as to whether it was ischemic or nonischemic.  Because of some abnormal beats and other features I recommended outpatient stress testing or cardiac CT.  In addition Mr. Paglia had some orthostatic symptoms and apparently was taken off of Entresto and placed on a beta-blocker.  Those seem to have resolved and vitals however did not indicate orthostasis.  CT coronary angiogram was performed on 09/09/2019 showed a calcium score 145 which was 78th percentile for age and sex matched control.  There was some mild plaque in the RCA minimal plaque in the LAD and circumflex.  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  The aortic valve was also mildly calcified.  I reviewed those findings with him today.  Overall he seems to be feeling well and I would describe his cardiomyopathy as nonischemic.  Blood pressure is somewhat elevated today 156/92 and seems to be top normal to mildly elevated at home as well.  01/02/2020  [name] is seen today in follow-up.  Overall he is doing well.  He seems to be tolerating medication changes.  Blood pressure is very good today 132/80.  He denies any chest pain or  worsening shortness of breath.  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  This is being followed expectantly.  He is currently on some antibiotics.  He did have a lipid profile 2 months ago which showed total cholesterol 124, HDL 41, LDL 67 and triglycerides 84.  PMHx:  Past Medical History:  Diagnosis Date  . Anemia   . Cancer ([place])    Lymphoma  . Essential hypertension   . GERD (gastroesophageal reflux disease)   . GI bleed   . HLD (hyperlipidemia)   . Weakness     Past Surgical History:  Procedure Laterality Date  . ESOPHAGOGASTRODUODENOSCOPY Left 01/18/2015   Procedure:  ESOPHAGOGASTRODUODENOSCOPY (EGD);  Surgeon: Wilford Corner, MD;  Location: [facility] ENDOSCOPY;  Service: Endoscopy;  Laterality: Left;  . INGUINAL HERNIA REPAIR Right 01/22/2015   Procedure: RIGHT INGUINAL LYMPH NODE BX;  Surgeon: Donnie Mesa, MD;  Location: [place];  Service: General;  Laterality: Right;  . MEDIASTINOSCOPY N/A 05/05/2017   Procedure: MEDIASTINOSCOPY;  Surgeon: Grace Isaac, MD;  Location: [place];  Service: Thoracic;  Laterality: N/A;  . PORTACATH PLACEMENT  01/26/2015    power port with tip SVC/[place]  . SKIN SURGERY     Small benign cysts over left scalp removed  . VIDEO BRONCHOSCOPY WITH ENDOBRONCHIAL ULTRASOUND N/A 05/05/2017   Procedure: VIDEO BRONCHOSCOPY WITH ENDOBRONCHIAL ULTRASOUND;  Surgeon: Grace Isaac, MD;  Location[facility];  Service: Thoracic;  Laterality: N/A;    FAMHx:  Family History  Problem Relation Age of Onset  . Hypertension Mother   . Hypertension Father   . Kidney failure Father   . Hypertension Sister   . Diabetes Sister   . Prostate cancer Brother   . Lupus Sister     SOCHx:   reports that he has never smoked. He has never used smokeless tobacco. He reports current alcohol use. He reports that he does not use drugs.  ALLERGIES:  No Known Allergies  ROS: Pertinent items noted in HPI and remainder of comprehensive ROS otherwise negative.  HOME MEDS: Current Outpatient Medications on File Prior to Visit  Medication Sig Dispense Refill  . aspirin 81 MG EC tablet Take 1 tablet (81 mg total) by mouth daily. Swallow whole. 30 tablet 11  . atorvastatin (LIPITOR) 80 MG tablet Take 1 tablet (80 mg total) by mouth daily. 30 tablet 5  . carvedilol (COREG) 3.125 MG tablet Take 1 tablet (3.125 mg total) by mouth 2 (two) times daily with a meal. 180 tablet 3  . Ibrutinib (IMBRUVICA) 420 MG TABS Take 1 tablet by mouth daily. 28 tablet 0  . nitrofurantoin, macrocrystal-monohydrate, (MACROBID) 100 MG capsule Take 1 capsule (100 mg total) by  mouth 2 (two) times daily. 44 capsule 0  . sacubitril-valsartan (ENTRESTO) 24-26 MG Take 1 tablet by mouth 2 (two) times daily. 60 tablet 11   No current facility-administered medications on file prior to visit.    LABS/IMAGING: No results found for this or any previous visit (from the past 48 hour(s)). No results found.  LIPID PANEL:    Component Value Date/Time   CHOL 124 10/21/2019 0904   TRIG 84 10/21/2019 0904   HDL 41 10/21/2019 0904   CHOLHDL 3.0 10/21/2019 0904   CHOLHDL 4.2 08/01/2019 1218   VLDL 18 08/01/2019 1218   LDLCALC 67 10/21/2019 0904     WEIGHTS: Wt Readings from Last 3 Encounters:  01/02/20 208 lb (94.3 kg)  12/06/19 210 lb (95.3 kg)  10/27/19  210 lb 4.8 oz (95.4 kg)    VITALS: BP 132/80   Pulse 65   Temp 97.8 F (36.6 C)   Ht 5\' 10"  (1.778 m)   Wt 208 lb (94.3 kg)   SpO2 98%   BMI 29.84 kg/m   EXAM: General appearance: alert and no distress Neck: no carotid bruit, no JVD and thyroid not enlarged, symmetric, no tenderness/mass/nodules Lungs: clear to auscultation bilaterally Heart: regular rate and rhythm, S1, S2 normal, no murmur, click, rub or gallop Abdomen: soft, non-tender; bowel sounds normal; no masses,  no organomegaly Extremities: extremities normal, atraumatic, no cyanosis or edema Pulses: 2+ and symmetric Skin: Skin color, texture, turgor normal. No rashes or lesions Neurologic: Grossly normal Psych: [place]  EKG: Sinus rhythm with PACs and fusion beats at 65-personally reviewed  ASSESSMENT: 1. 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 Mild nonobstructive coronary disease by cardiac CTA (09/2019)-CAC score of 145 3. Dilated ascending aorta to 4.3 cm 4. Recent stroke 5. Ectopy with no evidence of atrial fibrillation by ZIO monitor 6. Hypertension 7. Dyslipidemia 8. Mantle cell lymphoma 9. 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   Mr. Goody is doing well on his  current regimen of medications.  I do think that there is blood pressure room for small increase in his Entresto.  I like to increase up to 49/51 twice daily.  He will start that once he finishes his current prescription.  We will plan a repeat echo in about 6 months and follow-up with me at that time to reassess his LV function.  Cholesterol is at target with LDL less than 70.  Overall he feels well.  Have encouraged continued exercise and aerobic physical activity.  We will repeat a metabolic profile with his scheduled labs for the cancer center on December 9.  Follow-up in 6 months.  Pixie Casino, MD, [facility], [place] Director of the [facility]  [facility] Diplomate of the American Board of Clinical Lipidology Attending Cardiologist  Direct Dial: [phone]  Fax: [phone]  Website:  [email or web address] 01/02/2020, 8:13 AM

## 2020-01-09 ENCOUNTER — Ambulatory Visit: Payer: Medicare HMO | Admitting: Adult Health

## 2020-01-11 NOTE — Progress Notes (Signed)
[place]  Telephone:([phone]) [phone] Fax:([phone]    ID: ALASSANE KALAFUT DOB: 12-16-1950  MR#: [id number]  JYN#:[id number]  Patient Care Team: Shelda Pal, DO as PCP - General (Family Medicine) Debara Pickett Nadean Corwin, MD as PCP - Cardiology (Cardiology) Glennette Galster, Virgie Dad, MD as Consulting Physician (Oncology) Donnie Mesa, MD as Consulting Physician (General Surgery) Wilford Corner, MD as Consulting Physician (Gastroenterology) Ples Specter, MD as Referring Physician (Internal Medicine) Larina Earthly, MD as Referring Physician (Hematology and Oncology) Grace Isaac, MD as Consulting Physician (Cardiothoracic Surgery) OTHER MD: Napoleon Form MD; Dr Glennon Mac [phone]   CHIEF COMPLAINT: 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  He continues on ibrutinib.  He is not having bruising or other side effects related to this medication that he is aware of.  He has not had any fevers, drenching sweats, unexplained fatigue or unexplained weight loss and he has not noted any adenopathy.  REVIEW OF SYSTEMS: Will fell off the ladder today while putting up Christmas decorations.  He pulled something on his left hip area which is a bit uncomfortable.  He does not think he broke anything.  He tells me he is no longer seeing any blood in his urine and he has no discomfort with urination.  A detailed review of systems today was otherwise stable.   COVID 19 VACCINATION STATUS: s/p Moderna x2 and booster October 2021   HISTORY OF PRESENT ILLNESS: From the original intake note:   "Will" was in his usual good health until early December 2016, when he had a brief syncopal episode. He "blew this off" and it did not happen again until 01/17/2015 when he felt weak at work and later fainted at home. He was  brought to the ED where he was found to be guaiac positive (denies melena before that date) with a Hb of 4.5 MCV 82.2, platelets 295K, WBC 9.1 and normal INR and PTT. He was admitted and underwent EGD under Dr Michail Sermon 01/18/2015 showing a very large duodenal ulcer w clot. Abdominal US 01/19/2015 was not very informative but CT abd/pelvis 12/17,2016 showed adenopathy in the anterior mediastinum, retroperitoneum and pelvis, with a large ulcerated mass involving the distal stomach/proximal duodenum and extending into the gastrohepatic region.   On 01/22/2015 he underwent right inguinal lymph node biopsy, consistent with mantle cell lymphoma (([id number] and [id number])). Specifically the lymph node showed effacement of the architecture by sheets of small to medium sized lymphocytes with no apparent nodularity. The cells were positive for CD20, CD5, BCL-2, CD21, and cyclin D1. He 67 was low (10-20%). CD21 revealed a few residual germinal centers area at the B cells were negative for CD10, and BCL-6. CD23 was negative.  On 02/06/2015 the patient underwent a bone marrow aspirate and biopsy. The pathology from this procedure (FZB 17-2 and 17-7) was positive for involvement by non-Hodgkin's lymphoma with numerous atypical interstitial and paratrabecular lymphoid aggregates consistent with mantle cell lymphoma. Iron stain showed scant iron flow cytometry confirmed a monoclonal B-cell population, lambda restricted, consistent with the patient's known diagnosis of mantle cell lymphoma.  On 01/18/2015 the patient underwent EGD which found a massive duodenal bulb ulcer with adherent clot, although no active bleeding.his hemoglobin was in the 8.4 range and stable. He was discharged on 01/22/2015, but within 24 hours was readmitted with syncope and altered mental status.  He had aspirated and required intubation for his respiratory failure. Hemoglobin was down to 6.3. An NG tube was placed, the patient was multiply  transfused, and Surgery was considered but felt not to be feasible.accordingly the patient underwent arteriography 01/23/2015 which found the superior mesenteric and celiac arteries not to be the involved vessels. The gastroduodenal artery showed multiple branches providing supply to the hypervascular duodenal mass, and this artery was coil embolized.. Also a port was placed at the time of this procedure with a view to eventual chemotherapy.  He was discharged 01/26/2015,but required readmission 02/07/2015 for another presyncopal episode. He presented to the emergency room where he was found to have a hemoglobin of 7.5. The patient was transfusedIIA hemoglobin of 10 and was discharged 02/07/2014. He was scheduled to start CHOP right toxin 02/08/2014.  His subsequent history is as detailed below.   PAST MEDICAL HISTORY: Past Medical History:  Diagnosis Date  . Anemia   . Cancer ([place])    Lymphoma  . Essential hypertension   . GERD (gastroesophageal reflux disease)   . GI bleed   . HLD (hyperlipidemia)   . Weakness     PAST SURGICAL HISTORY: Past Surgical History:  Procedure Laterality Date  . ESOPHAGOGASTRODUODENOSCOPY Left 01/18/2015   Procedure: ESOPHAGOGASTRODUODENOSCOPY (EGD);  Surgeon: Wilford Corner, MD;  Location: [facility] ENDOSCOPY;  Service: Endoscopy;  Laterality: Left;  . INGUINAL HERNIA REPAIR Right 01/22/2015   Procedure: RIGHT INGUINAL LYMPH NODE BX;  Surgeon: Donnie Mesa, MD;  Location: [facility];  Service: General;  Laterality: Right;  . MEDIASTINOSCOPY N/A 05/05/2017   Procedure: MEDIASTINOSCOPY;  Surgeon: Grace Isaac, MD;  Location: [facility];  Service: Thoracic;  Laterality: N/A;  . PORTACATH PLACEMENT  01/26/2015    power port with tip SVC/RA Junction  . SKIN SURGERY     Small benign cysts over left scalp removed  . VIDEO BRONCHOSCOPY WITH ENDOBRONCHIAL ULTRASOUND N/A 05/05/2017   Procedure: VIDEO BRONCHOSCOPY WITH ENDOBRONCHIAL ULTRASOUND;  Surgeon: Grace Isaac,  MD;  Location: [facility] OR;  Service: Thoracic;  Laterality: N/A;    FAMILY HISTORY Family History  Problem Relation Age of Onset  . Hypertension Mother   . Hypertension Father   . Kidney failure Father   . Hypertension Sister   . Diabetes Sister   . Prostate cancer Brother   . Lupus Sister    The patient's father died from CHF complications age 93. The patient's mother is 78 y/o as of December 2016. The patient has 9 brothers, 8 sisters. One brother has prostate cancer. There is no other cancer history in the family to his knowledge.   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  He retired in 2020.  His wife of 42+ years, [name], is disabled due to RA. Daughter, [name] works as a Teacher, early years/pre for [employer] (she previously worked at the [employer] in records). Son, [name] owns a window washing business. Son, [name] is in Press photographer at [employer]. All live in [place]. The patient has 3 grandchildren. He attends a [employer].    ADVANCED DIRECTIVES: Wife, [name], is automatically his HCPOA   HEALTH MAINTENANCE: Social History   Tobacco Use  . Smoking status: Never Smoker  . Smokeless tobacco: Never Used  Vaping Use  . Vaping Use: Never used  Substance Use Topics  . Alcohol use: Yes    Comment: occasional beer  . Drug use: No    Colonoscopy:  PSA: 2.27 on 01/22/2015  Hepatitis serologies (B surface antigen, hepatitis B core antibody and hepatitis C IgM) negative 01/18/2015  No Known Allergies  Current Outpatient Medications  Medication Sig Dispense Refill  . aspirin 81 MG EC tablet Take 1 tablet (81 mg total) by mouth daily. Swallow whole. 30 tablet 11  . atorvastatin (LIPITOR) 80 MG tablet Take 1 tablet (80 mg total) by mouth daily. 30 tablet 5  . carvedilol (COREG) 3.125 MG tablet Take 1 tablet (3.125 mg total) by mouth 2 (two) times daily with a meal. 180 tablet 3  . Ibrutinib (IMBRUVICA) 420 MG TABS Take 1  tablet by mouth daily. 28 tablet 0  . nitrofurantoin, macrocrystal-monohydrate, (MACROBID) 100 MG capsule Take 1 capsule (100 mg total) by mouth 2 (two) times daily. 44 capsule 0  . sacubitril-valsartan (ENTRESTO) 49-51 MG Take 1 tablet by mouth 2 (two) times daily. 60 tablet 11   No current facility-administered medications for this visit.    OBJECTIVE: African-American man who appears younger than stated age 72:   01/12/20 1101  BP: (!) 142/71  Pulse: 61  Resp: 17  Temp: 97.8 F (36.6 C)  SpO2: 100%   Wt Readings from Last 3 Encounters:  01/12/20 207 lb 8 oz (94.1 kg)  01/02/20 208 lb (94.3 kg)  12/06/19 210 lb (95.3 kg)   Body mass index is 29.77 kg/m.    ECOG FS:1 - Symptomatic but completely ambulatory  Sclerae unicteric, EOMs intact Wearing a mask No cervical or supraclavicular adenopathy Lungs no rales or rhonchi Heart regular rate and rhythm Abd soft, nontender, positive bowel sounds MSK no focal spinal tenderness, no tenderness or mass in the area of recent trauma Neuro: nonfocal, well oriented, appropriate affect  LAB RESULTS:  CMP     Component Value Date/Time   NA 142 01/12/2020 1012   NA 142 10/21/2019 0904   NA 140 01/16/2017 0837   K 4.4 01/12/2020 1012   K 3.9 01/16/2017 0837   CL 108 01/12/2020 1012   CO2 26 01/12/2020 1012   CO2 24 01/16/2017 0837   GLUCOSE 96 01/12/2020 1012   GLUCOSE 102 01/16/2017 0837   BUN 19 01/12/2020 1012   BUN 17 10/21/2019 0904   BUN 20.4 01/16/2017 0837   CREATININE 1.24 01/12/2020 1012   CREATININE 1.2 01/16/2017 0837   CALCIUM 9.2 01/12/2020 1012   CALCIUM 8.9 01/16/2017 0837   PROT 6.9 01/12/2020 1012   PROT 6.6 10/21/2019 0904   PROT 6.9 01/16/2017 0837   ALBUMIN 4.0 01/12/2020 1012   ALBUMIN 4.4 10/21/2019 0904   ALBUMIN 4.0 01/16/2017 0837   AST 24 01/12/2020 1012   AST 20 01/16/2017 0837   ALT 29 01/12/2020 1012   ALT 22 01/16/2017 0837   ALKPHOS 39 01/12/2020 1012   ALKPHOS 47 01/16/2017  0837   BILITOT 1.0 01/12/2020 1012   BILITOT 0.7 10/21/2019 0904   BILITOT 0.36 01/16/2017 0837   GFRNONAA >60 01/12/2020 1012   GFRAA >60 10/21/2019 1028    No results found for: TOTALPROTELP, ALBUMINELP, A1GS, A2GS, BETS, BETA2SER, GAMS, MSPIKE, SPEI  Lab Results  Component Value Date   WBC 4.1 01/12/2020   NEUTROABS 2.4 01/12/2020   HGB 13.5 01/12/2020   HCT 41.6 01/12/2020   MCV 85.8 01/12/2020   PLT 304 01/12/2020    No results found for: LABCA2  No components found for: WCHENI778  No results for input(s): INR in the last 168 hours.  No results found for: LABCA2  No  results found for: MWU132  No results found for: GMW102  No results found for: VOZ366  No results found for: CA2729  No components found for: HGQUANT  No results found for: CEA1 / No results found for: CEA1   No results found for: AFPTUMOR  No results found for: CHROMOGRNA  No results found for: KPAFRELGTCHN, LAMBDASER, KAPLAMBRATIO (kappa/lambda light chains)  No results found for: HGBA, HGBA2QUANT, HGBFQUANT, HGBSQUAN (Hemoglobinopathy evaluation)   Lab Results  Component Value Date   LDH 216 (H) 01/12/2020    Lab Results  Component Value Date   IRON 115 02/16/2015   TIBC 345 02/16/2015   IRONPCTSAT 33 02/16/2015   (Iron and TIBC)  Lab Results  Component Value Date   FERRITIN 85 02/09/2015    Urinalysis    Component Value Date/Time   COLORURINE YELLOW 12/01/2019 1130   APPEARANCEUR CLOUDY (A) 12/01/2019 1130   LABSPEC 1.021 12/01/2019 1130   PHURINE 6.0 12/01/2019 1130   GLUCOSEU NEGATIVE 12/01/2019 1130   HGBUR NEGATIVE 12/01/2019 1130   BILIRUBINUR NEGATIVE 12/01/2019 1130   KETONESUR NEGATIVE 12/01/2019 1130   PROTEINUR 100 (A) 12/01/2019 1130   NITRITE NEGATIVE 12/01/2019 1130   LEUKOCYTESUR LARGE (A) 12/01/2019 1130    STUDIES: No results found.    ASSESSMENT: 69 y.o. [place] man with a diagnosis of mantle cell non-Hodgkin's lymphoma of extranodal and  solid organ sites presenting with syncope secondary to bleeding from a large ulcerated duodenal ulcer  (a) s/p coil embolization of the feeding (gastro-duodenal) artery 01/23/2015  (b) anemia--scant iron stores on bone marrow biopsy--s/p feraheme 02/13/2015  (1) right inguinal lymph node biopsy 01/22/2015 confirms mantle cell lymphoma  (a) bone marrow biopsy 02/06/2015 positive for involvement by the patient's mantle cell lymphoma  (b) IPI score of 5 (high risk) predicts a 5 year progression free survival of 50% with CHOP-Rituxan chemotherapy  (c) MIPI score of 5 (intermediate risk) predicts a median survival of 58 months  (d) the cells were CD5 positive, CD19 and 20+, CD23 negative, lambda restricted  (2) CHOP/Rituxan started 02/09/2015, completed 8 cycles 07/05/2015  (3)  [facility] consultation 04/06/2015. Patient opted against transplant consolidation  (4) rituximab maintenance started 08/16/2015, repeat every 2 months, discontinued February 2019  (a) PET scan had shown a suggestion of recurrence, but see (6) below  (5) PSA increase noted July 2017, back to baseline by December 2017  (6) restaging PET scan 11/17/2016 is consistent with disease progression  (a) repeat PET scan 02/02/2017 confirms increase in lymph nodes  (b) PET scan 04/20/2017 shows increasing hypermetabolic adenopathy  (c) bronchoscopy/mediastinoscopy 05/05/2017 shows granulomatous disease, noncaseating, with negative AFB/ GMS stains; <10% of lymphocytes are B-cell with slight lambda excess  (d) PET scan on 07/06/2017 shows mild increase in mediastinal lymphadenopathy. Increased hypermetabolic supraclavicular lymph node.  (e) PET scan 12/24/2017 interpreted as showing evidence of partial response   (f) PET 09/21/2018 shows decreased size and metabolism of scattered measurable nodes; [place] 4  (g) PET scan 11/29/2019 essentially unchanged except for increased prostate activity (see #10 below).  (h) thoracic surgery (Dr.  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  Intracranial MRA was negative.  (b) aspirin 81 mg started July 2021  (10) E. coli urinary tract infection diagnosed 12/01/2019, treated with nitrofurantoin  (a) PSA elevated to 13.0 on 12/01/2019  (b) P PSA 01/12/2020   PLAN: Will is now 4-1/2 years out from her completion of chemotherapy for his mantle cell lymphoma.  He has been on ibrutinib just about 3 years.  He is tolerating this well, with no significant side effects and with good apparent control.  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  I do not think he really hurt himself when the ladder came out from under him earlier today while hanging Christmas decorations but if he develops more pain or any other problems he will let me know  Otherwise he will see me again in 3 months.  We will consider repeating a PET scan in 6 to 12 months  Total encounter time 25 minutes.*    Naly Schwanz, Virgie Dad, MD  01/12/20 2:44 PM Medical Oncology and Hematology [facility] [address], [place] [address] Tel. [phone]    Fax. [phone]   I, Wilburn Mylar, am acting as scribe for Dr. Virgie Dad. Makenzy Krist.  I, Lurline Del MD, have reviewed the above documentation for  accuracy and completeness, and I agree with the above.   *Total Encounter Time as defined by the Centers for Medicare and Medicaid Services includes, in addition to the face-to-face time of a patient visit (documented in the note above) non-face-to-face time: obtaining and reviewing outside history, ordering and reviewing medications, tests or procedures, care coordination (communications with other health care professionals or caregivers) and documentation in the medical record.

## 2020-01-12 ENCOUNTER — Inpatient Hospital Stay: Payer: Medicare HMO | Attending: Oncology | Admitting: Oncology

## 2020-01-12 ENCOUNTER — Other Ambulatory Visit: Payer: Self-pay

## 2020-01-12 ENCOUNTER — Inpatient Hospital Stay: Payer: Medicare HMO

## 2020-01-12 VITALS — BP 142/71 | HR 61 | Temp 97.8°F | Resp 17 | Ht 70.0 in | Wt 207.5 lb

## 2020-01-12 DIAGNOSIS — R972 Elevated prostate specific antigen [PSA]: Secondary | ICD-10-CM | POA: Diagnosis not present

## 2020-01-12 DIAGNOSIS — C8319 Mantle cell lymphoma, extranodal and solid organ sites: Secondary | ICD-10-CM

## 2020-01-12 DIAGNOSIS — I6322 Cerebral infarction due to unspecified occlusion or stenosis of basilar arteries: Secondary | ICD-10-CM | POA: Diagnosis not present

## 2020-01-12 DIAGNOSIS — I6329 Cerebral infarction due to unspecified occlusion or stenosis of other precerebral arteries: Secondary | ICD-10-CM

## 2020-01-12 DIAGNOSIS — K276 Chronic or unspecified peptic ulcer, site unspecified, with both hemorrhage and perforation: Secondary | ICD-10-CM

## 2020-01-12 LAB — COMPREHENSIVE METABOLIC PANEL
ALT: 29 U/L (ref 0–44)
AST: 24 U/L (ref 15–41)
Albumin: 4 g/dL (ref 3.5–5.0)
Alkaline Phosphatase: 39 U/L (ref 38–126)
Anion gap: 8 (ref 5–15)
BUN: 19 mg/dL (ref 8–23)
CO2: 26 mmol/L (ref 22–32)
Calcium: 9.2 mg/dL (ref 8.9–10.3)
Chloride: 108 mmol/L (ref 98–111)
Creatinine, Ser: 1.24 mg/dL (ref 0.61–1.24)
GFR, Estimated: >60 mL/min (ref >60)
Glucose, Bld: 96 mg/dL (ref 70–99)
Potassium: 4.4 mmol/L (ref 3.5–5.1)
Sodium: 142 mmol/L (ref 135–145)
Total Bilirubin: 1 mg/dL (ref 0.3–1.2)
Total Protein: 6.9 g/dL (ref 6.5–8.1)

## 2020-01-12 LAB — CBC WITH DIFFERENTIAL/PLATELET
Abs Immature Granulocytes: 0.02 10*3/uL (ref 0.00–0.07)
Basophils Absolute: 0 10*3/uL (ref 0.0–0.1)
Basophils Relative: 1 %
Eosinophils Absolute: 0 10*3/uL (ref 0.0–0.5)
Eosinophils Relative: 1 %
HCT: 41.6 % (ref 39.0–52.0)
Hemoglobin: 13.5 g/dL (ref 13.0–17.0)
Immature Granulocytes: 1 %
Lymphocytes Relative: 24 %
Lymphs Abs: 1 10*3/uL (ref 0.7–4.0)
MCH: 27.8 pg (ref 26.0–34.0)
MCHC: 32.5 g/dL (ref 30.0–36.0)
MCV: 85.8 fL (ref 80.0–100.0)
Monocytes Absolute: 0.6 10*3/uL (ref 0.1–1.0)
Monocytes Relative: 15 %
Neutro Abs: 2.4 10*3/uL (ref 1.7–7.7)
Neutrophils Relative %: 58 %
Platelets: 304 10*3/uL (ref 150–400)
RBC: 4.85 MIL/uL (ref 4.22–5.81)
RDW: 15.2 % (ref 11.5–15.5)
WBC: 4.1 10*3/uL (ref 4.0–10.5)
nRBC: 0 % (ref 0.0–0.2)

## 2020-01-12 LAB — LACTATE DEHYDROGENASE: LDH: 216 U/L — ABNORMAL HIGH (ref 98–192)

## 2020-01-13 ENCOUNTER — Encounter: Payer: Self-pay | Admitting: Oncology

## 2020-01-13 LAB — PROSTATE-SPECIFIC AG, SERUM (LABCORP): Prostate Specific Ag, Serum: 3.3 ng/mL (ref 0.0–4.0)

## 2020-01-25 ENCOUNTER — Other Ambulatory Visit: Payer: Self-pay | Admitting: Oncology

## 2020-01-25 DIAGNOSIS — C8319 Mantle cell lymphoma, extranodal and solid organ sites: Secondary | ICD-10-CM

## 2020-01-30 MED FILL — IMBRUVICA 420 MG TAB: 420 | 28 days supply | Qty: 28 | Fill #0

## 2020-02-20 ENCOUNTER — Other Ambulatory Visit: Payer: Self-pay | Admitting: Oncology

## 2020-02-20 ENCOUNTER — Other Ambulatory Visit: Payer: Self-pay | Admitting: Adult Health

## 2020-02-20 DIAGNOSIS — C8319 Mantle cell lymphoma, extranodal and solid organ sites: Secondary | ICD-10-CM

## 2020-02-20 NOTE — Progress Notes (Signed)
Referral for evusheld placed per Dr. Magrinat 

## 2020-02-24 MED FILL — IMBRUVICA 420 MG TAB: 420 | 28 days supply | Qty: 28 | Fill #0

## 2020-03-07 ENCOUNTER — Other Ambulatory Visit: Payer: Self-pay | Admitting: Family Medicine

## 2020-03-10 ENCOUNTER — Telehealth: Payer: Self-pay | Admitting: Adult Health

## 2020-03-10 NOTE — Telephone Encounter (Signed)
I connected by phone with [name] on 03/10/2020, 11:41 AM to discuss the potential use of a new treatment for mild to moderate COVID-19 viral infection in non-hospitalized patients.  This patient is a 70 y.o. male that meets the FDA criteria for Emergency Use Authorization of tixagevimab/cilgavimab for pre-exposure prophylaxis of COVID-19 disease. Pt meets following criteria:  Age >12 yr and weight > 40kg  Not currently infected with SARS-CoV-2 and has no known recent exposure to an individual infected with SARS-CoV-2 AND o Who has moderate to severe immune compromise due to a medical condition or receipt of immunosuppressive medications or treatments and may not mount an adequate immune response to COVID-19 vaccination or  o Vaccination with any available COVID-19 vaccine, according to the approved or authorized schedule, is not recommended due to a history of severe adverse reaction (e.g., severe allergic reaction) to a COVID-19 vaccine(s) and/or COVID-19 vaccine component(s).  o Patient meets the following definition of mod-severe immune compromised status: 6. Other actively treated hematologic malignancies or severe congenital immunodeficiency syndromes  I have spoken and communicated the following to the patient or parent/caregiver regarding COVID monoclonal antibody treatment:  1. FDA has authorized the emergency use of tixagevimab/cilgavimab for the pre-exposure prophylaxis of COVID-19 in patients with moderate-severe immunocompromised status, who meet above EUA criteria.  2. The significant known and potential risks and benefits of COVID monoclonal antibody, and the extent to which such potential risks and benefits are unknown.  3. Information on available alternative treatments and the risks and benefits of those alternatives, including clinical trials.  4. The patient or parent/caregiver has the option to accept or refuse COVID monoclonal antibody treatment.  After reviewing this  information with the patient, he would like to receive more information about the medication and discuss with Dr. Jana Hakim about his upcoming appointment on 04/12/2020.    Scot Dock, NP, 03/10/2020, 11:41 AM

## 2020-03-21 ENCOUNTER — Other Ambulatory Visit: Payer: Self-pay | Admitting: Oncology

## 2020-03-21 DIAGNOSIS — C8319 Mantle cell lymphoma, extranodal and solid organ sites: Secondary | ICD-10-CM

## 2020-03-22 MED FILL — IMBRUVICA 420 MG TAB: 420 | 28 days supply | Qty: 28 | Fill #0

## 2020-04-11 NOTE — Progress Notes (Signed)
[facility]  [phone] [phone]) [phone]    ID: [name] DOB: Aug 05, 1950  MR#: [id number]  WPY#:[id number]  Patient Care Team: Shelda Pal, DO as PCP - General (Family Medicine) Debara Pickett Nadean Corwin, MD as PCP - Cardiology (Cardiology) Kaylenn Civil, Virgie Dad, MD as Consulting Physician (Oncology) Donnie Mesa, MD as Consulting Physician (General Surgery) Wilford Corner, MD as Consulting Physician (Gastroenterology) Ples Specter, MD as Referring Physician (Internal Medicine) Larina Earthly, MD as Referring Physician (Hematology and Oncology) Grace Isaac, MD as Consulting Physician (Cardiothoracic Surgery) OTHER MD: Napoleon Form MD; Dr Glennon Mac [phone]   CHIEF COMPLAINT: 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  He is accompanied by his wife [name].  He continues on ibrutinib.  He generally does not have any bruising or bleeding issues but this weekend he was doing some ax work and developed a blood blister on the radial aspect of the right index finger near the base.  He has had no other side effects from this medication that he is aware of  REVIEW OF SYSTEMS: [name] denies fevers, drenching sweats, unexplained fatigue or unexplained fever, or any adenopathy.  His wife says that he complains of feeling tired but he actually works out every morning and doing weights and cardio.  He has a little bit of tearing which is probably unrelated.  Sometimes he feels cold when his wife feels hot.  This is of course common.  A detailed review of systems was otherwise stable   COVID 19 VACCINATION STATUS: s/p Moderna x2 and booster October 2021; to receive Evusheld March or April 2022   HISTORY OF PRESENT ILLNESS: From the original intake note:   "[name]" was in his usual good  health until early December 2016, when he had a brief syncopal episode. He "blew this off" and it did not happen again until 01/17/2015 when he felt weak at work and later fainted at home. He was brought to the ED where he was found to be guaiac positive (denies melena before that date) with a Hb of 4.5 MCV 82.2, platelets 295K, WBC 9.1 and normal INR and PTT. He was admitted and underwent EGD under Dr Michail Sermon 01/18/2015 showing a very large duodenal ulcer w clot. Abdominal US 01/19/2015 was not very informative but CT abd/pelvis 12/17,2016 showed adenopathy in the anterior mediastinum, retroperitoneum and pelvis, with a large ulcerated mass involving the distal stomach/proximal duodenum and extending into the gastrohepatic region.   On 01/22/2015 he underwent right inguinal lymph node biopsy, consistent with mantle cell lymphoma (([id number] and [id number])). Specifically the lymph node showed effacement of the architecture by sheets of small to medium sized lymphocytes with no apparent nodularity. The cells were positive for CD20, CD5, BCL-2, CD21, and cyclin D1. He 67 was low (10-20%). CD21 revealed a few residual germinal centers area at the B cells were negative for CD10, and BCL-6. CD23 was negative.  On 02/06/2015 the patient underwent a bone marrow aspirate and biopsy. The pathology from this procedure (FZB 17-2 and 17-7) was positive for involvement by non-Hodgkin's lymphoma with numerous atypical interstitial and paratrabecular lymphoid aggregates consistent with mantle cell lymphoma. Iron stain showed scant iron flow cytometry confirmed a monoclonal B-cell population, lambda restricted, consistent with the patient's known diagnosis of mantle cell lymphoma.  On 01/18/2015 the patient underwent EGD which found a massive  duodenal bulb ulcer with adherent clot, although no active bleeding.his hemoglobin was in the 8.4 range and stable. He was discharged on 01/22/2015, but within 24 hours was readmitted  with syncope and altered mental status. He had aspirated and required intubation for his respiratory failure. Hemoglobin was down to 6.3. An NG tube was placed, the patient was multiply transfused, and Surgery was considered but felt not to be feasible.accordingly the patient underwent arteriography 01/23/2015 which found the superior mesenteric and celiac arteries not to be the involved vessels. The gastroduodenal artery showed multiple branches providing supply to the hypervascular duodenal mass, and this artery was coil embolized.. Also a port was placed at the time of this procedure with a view to eventual chemotherapy.  He was discharged 01/26/2015,but required readmission 02/07/2015 for another presyncopal episode. He presented to the emergency room where he was found to have a hemoglobin of 7.5. The patient was transfusedIIA hemoglobin of 10 and was discharged 02/07/2014. He was scheduled to start CHOP right toxin 02/08/2014.  His subsequent history is as detailed below.   PAST MEDICAL HISTORY: Past Medical History:  Diagnosis Date  . Anemia   . Cancer ([place])    Lymphoma  . Essential hypertension   . GERD (gastroesophageal reflux disease)   . GI bleed   . HLD (hyperlipidemia)   . Weakness     PAST SURGICAL HISTORY: Past Surgical History:  Procedure Laterality Date  . ESOPHAGOGASTRODUODENOSCOPY Left 01/18/2015   Procedure: ESOPHAGOGASTRODUODENOSCOPY (EGD);  Surgeon: Wilford Corner, MD;  Location: [facility] ENDOSCOPY;  Service: Endoscopy;  Laterality: Left;  . INGUINAL HERNIA REPAIR Right 01/22/2015   Procedure: RIGHT INGUINAL LYMPH NODE BX;  Surgeon: Donnie Mesa, MD;  Location: [place];  Service: General;  Laterality: Right;  . MEDIASTINOSCOPY N/A 05/05/2017   Procedure: MEDIASTINOSCOPY;  Surgeon: Grace Isaac, MD;  Location: [place];  Service: Thoracic;  Laterality: N/A;  . PORTACATH PLACEMENT  01/26/2015    power port with tip SVC/RA Junction  . SKIN SURGERY     Small benign cysts  over left scalp removed  . VIDEO BRONCHOSCOPY WITH ENDOBRONCHIAL ULTRASOUND N/A 05/05/2017   Procedure: VIDEO BRONCHOSCOPY WITH ENDOBRONCHIAL ULTRASOUND;  Surgeon: Grace Isaac, MD;  Location: [facility] OR;  Service: Thoracic;  Laterality: N/A;    FAMILY HISTORY Family History  Problem Relation Age of Onset  . Hypertension Mother   . Hypertension Father   . Kidney failure Father   . Hypertension Sister   . Diabetes Sister   . Prostate cancer Brother   . Lupus Sister    The patient's father died from CHF complications age 70. The patient's mother is 27 y/o as of December 2016. The patient has 9 brothers, 8 sisters. One brother has prostate cancer. There is no other cancer history in the family to his knowledge.   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  He retired in 2020.  His wife of 42+ years, [name], is disabled due to RA. Daughter, [name] works as a Teacher, early years/pre for [employer] (she previously worked at the [employer] in records). Son, [name] owns a window washing business. Son, [name] is in Press photographer at [employer]. All live in [place]. The patient has 3 grandchildren. He attends a [employer].    ADVANCED DIRECTIVES: Wife, [name], is automatically his HCPOA   HEALTH MAINTENANCE: Social History   Tobacco Use  . Smoking status: Never Smoker  . Smokeless tobacco:  Never Used  Vaping Use  . Vaping Use: Never used  Substance Use Topics  . Alcohol use: Yes    Comment: occasional beer  . Drug use: No    Colonoscopy:  PSA: 2.27 on 01/22/2015  Hepatitis serologies (B surface antigen, hepatitis B core antibody and hepatitis C IgM) negative 01/18/2015  No Known Allergies  Current Outpatient Medications  Medication Sig Dispense Refill  . aspirin 81 MG EC tablet Take 1 tablet (81 mg total) by mouth daily. Swallow whole. 30 tablet 11  . atorvastatin (LIPITOR) 80 MG tablet TAKE 1 TABLET BY MOUTH EVERY DAY 90 tablet 1   . carvedilol (COREG) 3.125 MG tablet Take 1 tablet (3.125 mg total) by mouth 2 (two) times daily with a meal. 180 tablet 3  . IMBRUVICA 420 MG TABS TAKE 1 TABLET BY MOUTH DAILY. 28 tablet 0  . nitrofurantoin, macrocrystal-monohydrate, (MACROBID) 100 MG capsule Take 1 capsule (100 mg total) by mouth 2 (two) times daily. 44 capsule 0  . sacubitril-valsartan (ENTRESTO) 49-51 MG Take 1 tablet by mouth 2 (two) times daily. 60 tablet 11   No current facility-administered medications for this visit.    OBJECTIVE: African-American man who appears younger than stated age 70:   04/12/20 0850  BP: (!) 162/82  Pulse: 93  Resp: 20  Temp: (!) 97.3 F (36.3 C)  SpO2: 99%   Wt Readings from Last 3 Encounters:  04/12/20 203 lb 11.2 oz (92.4 kg)  01/12/20 207 lb 8 oz (94.1 kg)  01/02/20 208 lb (94.3 kg)   Body mass index is 29.23 kg/m.    ECOG FS:1 - Symptomatic but completely ambulatory  Sclerae unicteric, EOMs intact Wearing a mask No cervical or supraclavicular adenopathy, no inguinal or axillary adenopathy Lungs no rales or rhonchi Heart regular rate and rhythm Abd soft, nontender, positive bowel sounds MSK no focal spinal tenderness, no upper extremity lymphedema Neuro: nonfocal, well oriented, appropriate affect   LAB RESULTS:  CMP     Component Value Date/Time   NA 142 01/12/2020 1012   NA 142 10/21/2019 0904   NA 140 01/16/2017 0837   K 4.4 01/12/2020 1012   K 3.9 01/16/2017 0837   CL 108 01/12/2020 1012   CO2 26 01/12/2020 1012   CO2 24 01/16/2017 0837   GLUCOSE 96 01/12/2020 1012   GLUCOSE 102 01/16/2017 0837   BUN 19 01/12/2020 1012   BUN 17 10/21/2019 0904   BUN 20.4 01/16/2017 0837   CREATININE 1.24 01/12/2020 1012   CREATININE 1.2 01/16/2017 0837   CALCIUM 9.2 01/12/2020 1012   CALCIUM 8.9 01/16/2017 0837   PROT 6.9 01/12/2020 1012   PROT 6.6 10/21/2019 0904   PROT 6.9 01/16/2017 0837   ALBUMIN 4.0 01/12/2020 1012   ALBUMIN 4.4 10/21/2019 0904    ALBUMIN 4.0 01/16/2017 0837   AST 24 01/12/2020 1012   AST 20 01/16/2017 0837   ALT 29 01/12/2020 1012   ALT 22 01/16/2017 0837   ALKPHOS 39 01/12/2020 1012   ALKPHOS 47 01/16/2017 0837   BILITOT 1.0 01/12/2020 1012   BILITOT 0.7 10/21/2019 0904   BILITOT 0.36 01/16/2017 0837   GFRNONAA >60 01/12/2020 1012   GFRAA >60 10/21/2019 1028    No results found for: TOTALPROTELP, ALBUMINELP, A1GS, A2GS, BETS, BETA2SER, GAMS, MSPIKE, SPEI  Lab Results  Component Value Date   WBC 3.5 (L) 04/12/2020   NEUTROABS 1.9 04/12/2020   HGB 13.0 04/12/2020   HCT 40.9 04/12/2020   MCV 88.0 04/12/2020  PLT 268 04/12/2020    No results found for: LABCA2  No components found for: DGUYQI347  No results for input(s): INR in the last 168 hours.  No results found for: LABCA2  No results found for: QQV956  No results found for: LOV564  No results found for: PPI951  No results found for: CA2729  No components found for: HGQUANT  No results found for: CEA1 / No results found for: CEA1   No results found for: AFPTUMOR  No results found for: CHROMOGRNA  No results found for: KPAFRELGTCHN, LAMBDASER, KAPLAMBRATIO (kappa/lambda light chains)  No results found for: HGBA, HGBA2QUANT, HGBFQUANT, HGBSQUAN (Hemoglobinopathy evaluation)   Lab Results  Component Value Date   LDH 216 (H) 01/12/2020    Lab Results  Component Value Date   IRON 115 02/16/2015   TIBC 345 02/16/2015   IRONPCTSAT 33 02/16/2015   (Iron and TIBC)  Lab Results  Component Value Date   FERRITIN 85 02/09/2015    Urinalysis    Component Value Date/Time   COLORURINE YELLOW 12/01/2019 1130   APPEARANCEUR CLOUDY (A) 12/01/2019 1130   LABSPEC 1.021 12/01/2019 1130   PHURINE 6.0 12/01/2019 1130   GLUCOSEU NEGATIVE 12/01/2019 1130   HGBUR NEGATIVE 12/01/2019 1130   BILIRUBINUR NEGATIVE 12/01/2019 1130   KETONESUR NEGATIVE 12/01/2019 1130   PROTEINUR 100 (A) 12/01/2019 1130   NITRITE NEGATIVE 12/01/2019 1130    LEUKOCYTESUR LARGE (A) 12/01/2019 1130    STUDIES: No results found.    ASSESSMENT: 70 y.o. [place] man with a diagnosis of mantle cell non-Hodgkin's lymphoma of extranodal and solid organ sites presenting with syncope secondary to bleeding from a large ulcerated duodenal ulcer  (a) s/p coil embolization of the feeding (gastro-duodenal) artery 01/23/2015  (b) anemia--scant iron stores on bone marrow biopsy--s/p feraheme 02/13/2015  (1) right inguinal lymph node biopsy 01/22/2015 confirms mantle cell lymphoma  (a) bone marrow biopsy 02/06/2015 positive for involvement by the patient's mantle cell lymphoma  (b) IPI score of 5 (high risk) predicts a 5 year progression free survival of 50% with CHOP-Rituxan chemotherapy  (c) MIPI score of 5 (intermediate risk) predicts a median survival of 58 months  (d) the cells were CD5 positive, CD19 and 20+, CD23 negative, lambda restricted  (2) CHOP/Rituxan started 02/09/2015, completed 8 cycles 07/05/2015  (3)  [facility] consultation 04/06/2015. Patient opted against transplant consolidation  (4) rituximab maintenance started 08/16/2015, repeat every 2 months, discontinued February 2019  (a) PET scan had shown a suggestion of recurrence, but see (6) below  (5) PSA increase noted July 2017, back to baseline by December 2017  (6) restaging PET scan 11/17/2016 is consistent with disease progression  (a) repeat PET scan 02/02/2017 confirms increase in lymph nodes  (b) PET scan 04/20/2017 shows increasing hypermetabolic adenopathy  (c) bronchoscopy/mediastinoscopy 05/05/2017 shows granulomatous disease, noncaseating, with negative AFB/ GMS stains; <10% of lymphocytes are B-cell with slight lambda excess  (d) PET scan on 07/06/2017 shows mild increase in mediastinal lymphadenopathy. Increased hypermetabolic supraclavicular lymph node.  (e) PET scan 12/24/2017 interpreted as showing evidence of partial response   (f) PET 09/21/2018 shows decreased size  and metabolism of scattered measurable nodes; [place] 4  (g) PET scan 11/29/2019 essentially unchanged except for increased prostate activity (see #10 below).  (h) thoracic surgery (Dr. 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  Intracranial MRA was negative.  (b) aspirin 81 mg started July 2021  (10) E. coli urinary tract infection diagnosed 12/01/2019, treated with nitrofurantoin  (a) PSA elevated to 13.0 on 12/01/2019  (b) PSA 01/12/2020 normalized (2.27)   PLAN: [name] is now a little over 5 years out from initial diagnosis of his mantle cell lymphoma.  He has been on ibrutinib for a little over 3 years, with excellent tolerance and no evidence of disease progression.  This is very favorable.  As far as his lymphoma is concerned the plan is to continue ibrutinib indefinitely.  He [name] have a restaging PET scan in October of this year.  He understands that even though he has been vaccinated he likely has no antibodies given his disease and its treatment.  He is a good candidate for Evusheld and today we discussed the possible toxicity side effects and complications of this agent.  The new dose [name] be available after 04/23/2020.  He [name] call and tell me what day he would like to receive it and we [name] set that up for him.  Otherwise he knows to call for any other issue that may develop  before the next visit.  Total encounter time 25 minutes.*  Dafne Nield, Virgie Dad, MD  04/12/20 9:16 AM Medical Oncology and Hematology [facility] [place], [place] [address] Tel. [phone]    Fax. [phone]   I, Wilburn Mylar, am acting as scribe for Dr. Virgie Dad. Rudell Ortman.  I, Lurline Del MD, have reviewed the above documentation for accuracy and completeness, and I agree with the above.   *Total Encounter Time as defined by the Centers for Medicare and Medicaid Services includes, in addition to the face-to-face time of a patient visit (documented in the note above) non-face-to-face time: obtaining and reviewing outside history, ordering and reviewing medications, tests or procedures, care coordination (communications with other health care professionals or caregivers) and documentation in the medical record.

## 2020-04-12 ENCOUNTER — Inpatient Hospital Stay: Payer: Medicare HMO

## 2020-04-12 ENCOUNTER — Other Ambulatory Visit: Payer: Self-pay

## 2020-04-12 ENCOUNTER — Inpatient Hospital Stay: Payer: Medicare HMO | Attending: Oncology | Admitting: Oncology

## 2020-04-12 VITALS — BP 162/82 | HR 93 | Temp 97.3°F | Resp 20 | Ht 70.0 in | Wt 203.7 lb

## 2020-04-12 DIAGNOSIS — I6329 Cerebral infarction due to unspecified occlusion or stenosis of other precerebral arteries: Secondary | ICD-10-CM

## 2020-04-12 DIAGNOSIS — Z79899 Other long term (current) drug therapy: Secondary | ICD-10-CM | POA: Diagnosis not present

## 2020-04-12 DIAGNOSIS — K276 Chronic or unspecified peptic ulcer, site unspecified, with both hemorrhage and perforation: Secondary | ICD-10-CM

## 2020-04-12 DIAGNOSIS — I6322 Cerebral infarction due to unspecified occlusion or stenosis of basilar arteries: Secondary | ICD-10-CM

## 2020-04-12 DIAGNOSIS — C8319 Mantle cell lymphoma, extranodal and solid organ sites: Secondary | ICD-10-CM | POA: Insufficient documentation

## 2020-04-12 LAB — COMPREHENSIVE METABOLIC PANEL
ALT: 23 U/L (ref 0–44)
AST: 20 U/L (ref 15–41)
Albumin: 4.1 g/dL (ref 3.5–5.0)
Alkaline Phosphatase: 36 U/L — ABNORMAL LOW (ref 38–126)
Anion gap: 7 (ref 5–15)
BUN: 16 mg/dL (ref 8–23)
CO2: 26 mmol/L (ref 22–32)
Calcium: 8.9 mg/dL (ref 8.9–10.3)
Chloride: 109 mmol/L (ref 98–111)
Creatinine, Ser: 1.13 mg/dL (ref 0.61–1.24)
GFR, Estimated: >60 mL/min (ref >60)
Glucose, Bld: 92 mg/dL (ref 70–99)
Potassium: 4.8 mmol/L (ref 3.5–5.1)
Sodium: 142 mmol/L (ref 135–145)
Total Bilirubin: 0.9 mg/dL (ref 0.3–1.2)
Total Protein: 6.6 g/dL (ref 6.5–8.1)

## 2020-04-12 LAB — CBC WITH DIFFERENTIAL/PLATELET
Abs Immature Granulocytes: 0.01 10*3/uL (ref 0.00–0.07)
Basophils Absolute: 0 10*3/uL (ref 0.0–0.1)
Basophils Relative: 1 %
Eosinophils Absolute: 0 10*3/uL (ref 0.0–0.5)
Eosinophils Relative: 1 %
HCT: 40.9 % (ref 39.0–52.0)
Hemoglobin: 13 g/dL (ref 13.0–17.0)
Immature Granulocytes: 0 %
Lymphocytes Relative: 27 %
Lymphs Abs: 0.9 10*3/uL (ref 0.7–4.0)
MCH: 28 pg (ref 26.0–34.0)
MCHC: 31.8 g/dL (ref 30.0–36.0)
MCV: 88 fL (ref 80.0–100.0)
Monocytes Absolute: 0.7 10*3/uL (ref 0.1–1.0)
Monocytes Relative: 19 %
Neutro Abs: 1.9 10*3/uL (ref 1.7–7.7)
Neutrophils Relative %: 52 %
Platelets: 268 10*3/uL (ref 150–400)
RBC: 4.65 MIL/uL (ref 4.22–5.81)
RDW: 14.1 % (ref 11.5–15.5)
WBC: 3.5 10*3/uL — ABNORMAL LOW (ref 4.0–10.5)
nRBC: 0 % (ref 0.0–0.2)

## 2020-04-12 LAB — LACTATE DEHYDROGENASE: LDH: 208 U/L — ABNORMAL HIGH (ref 98–192)

## 2020-04-13 ENCOUNTER — Telehealth: Payer: Self-pay | Admitting: Oncology

## 2020-04-13 DIAGNOSIS — H5203 Hypermetropia, bilateral: Secondary | ICD-10-CM | POA: Diagnosis not present

## 2020-04-13 LAB — PROSTATE-SPECIFIC AG, SERUM (LABCORP): Prostate Specific Ag, Serum: 3.7 ng/mL (ref 0.0–4.0)

## 2020-04-13 NOTE — Telephone Encounter (Signed)
Scheduled appts per 3/10 los. Unable to leave voicemail. Mailed appt reminder and calendar.

## 2020-04-17 ENCOUNTER — Other Ambulatory Visit: Payer: Self-pay | Admitting: Oncology

## 2020-04-17 DIAGNOSIS — C8319 Mantle cell lymphoma, extranodal and solid organ sites: Secondary | ICD-10-CM

## 2020-04-25 ENCOUNTER — Other Ambulatory Visit: Payer: Self-pay | Admitting: Adult Health

## 2020-04-25 DIAGNOSIS — C8319 Mantle cell lymphoma, extranodal and solid organ sites: Secondary | ICD-10-CM

## 2020-04-25 NOTE — Progress Notes (Signed)
I connected by phone with [name] on 04/25/2020, 5:55 PM to discuss the potential use of a new treatment, tixagevimab/cilgavimab, for pre-exposure prophylaxis for prevention of coronavirus disease 2019 (COVID-19) caused by the SARS-CoV-2 virus.  This patient is a 70 y.o. male that meets the FDA criteria for Emergency Use Authorization of tixagevimab/cilgavimab for pre-exposure prophylaxis of COVID-19 disease. Pt meets following criteria:  Age >12 yr and weight > 40kg  Not currently infected with SARS-CoV-2 and has no known recent exposure to an individual infected with SARS-CoV-2 AND o Who has moderate to severe immune compromise due to a medical condition or receipt of immunosuppressive medications or treatments and may not mount an adequate immune response to COVID-19 vaccination or  o Vaccination with any available COVID-19 vaccine, according to the approved or authorized schedule, is not recommended due to a history of severe adverse reaction (e.g., severe allergic reaction) to a COVID-19 vaccine(s) and/or COVID-19 vaccine component(s).  o Patient meets the following definition of mod-severe immune compromised status: 6. Other actively treated hematologic malignancies or severe congenital immunodeficiency syndromes  I have spoken and communicated the following to the patient or parent/caregiver regarding COVID monoclonal antibody treatment:  1. FDA has authorized the emergency use of tixagevimab/cilgavimab for the pre-exposure prophylaxis of COVID-19 in patients with moderate-severe immunocompromised status, who meet above EUA criteria.  2. The significant known and potential risks and benefits of COVID monoclonal antibody, and the extent to which such potential risks and benefits are unknown.  3. Information on available alternative treatments and the risks and benefits of those alternatives, including clinical trials.  4. The patient or parent/caregiver has the option to accept or  refuse COVID monoclonal antibody treatment.  After reviewing this information with the patient, agree to receive tixagevimab/cilgavimab.    Scot Dock, NP, 04/25/2020, 5:55 PM

## 2020-04-26 ENCOUNTER — Telehealth: Payer: Self-pay | Admitting: Adult Health

## 2020-04-26 NOTE — Telephone Encounter (Signed)
Scheduled per sch msg. Called and spoke with patient. Confirmed date and time ° °

## 2020-05-01 ENCOUNTER — Other Ambulatory Visit (HOSPITAL_COMMUNITY): Payer: Self-pay

## 2020-05-15 ENCOUNTER — Other Ambulatory Visit (HOSPITAL_COMMUNITY): Payer: Self-pay

## 2020-05-18 ENCOUNTER — Other Ambulatory Visit (HOSPITAL_COMMUNITY): Payer: Self-pay

## 2020-05-21 ENCOUNTER — Other Ambulatory Visit: Payer: Self-pay

## 2020-05-21 ENCOUNTER — Other Ambulatory Visit (HOSPITAL_COMMUNITY): Payer: Self-pay

## 2020-05-21 ENCOUNTER — Other Ambulatory Visit: Payer: Self-pay | Admitting: Oncology

## 2020-05-21 ENCOUNTER — Inpatient Hospital Stay: Payer: Medicare HMO | Attending: Oncology

## 2020-05-21 DIAGNOSIS — C8319 Mantle cell lymphoma, extranodal and solid organ sites: Secondary | ICD-10-CM

## 2020-05-21 MED ORDER — CILGAVIMAB (PART OF EVUSHELD) INJECTION
300.0000 mg | Freq: Once | INTRAMUSCULAR | Status: AC
  Administered 2020-05-21: 300 mg via INTRAMUSCULAR
  Filled 2020-05-21: qty 3

## 2020-05-21 MED ORDER — TIXAGEVIMAB (PART OF EVUSHELD) INJECTION
300.0000 mg | Freq: Once | INTRAMUSCULAR | Status: AC
  Administered 2020-05-21: 300 mg via INTRAMUSCULAR
  Filled 2020-05-21: qty 3

## 2020-05-21 NOTE — Progress Notes (Signed)
Patient received evusheld injection today. Patient declined staying for full 1-hour post-observation d/t appointment schedule. Patient's vital signs retaken and remained stable. Patient tolerated injections well. Evusheld factsheet provided.

## 2020-05-22 ENCOUNTER — Other Ambulatory Visit (HOSPITAL_COMMUNITY): Payer: Self-pay

## 2020-05-22 MED ORDER — IMBRUVICA 420 MG PO TABS
1.0000 | ORAL_TABLET | Freq: Every day | ORAL | 0 refills | Status: DC
  Filled 2020-05-22: qty 28, 28d supply, fill #0

## 2020-06-13 ENCOUNTER — Other Ambulatory Visit: Payer: Self-pay | Admitting: Oncology

## 2020-06-13 ENCOUNTER — Other Ambulatory Visit (HOSPITAL_COMMUNITY): Payer: Self-pay

## 2020-06-13 DIAGNOSIS — C8319 Mantle cell lymphoma, extranodal and solid organ sites: Secondary | ICD-10-CM

## 2020-06-13 MED ORDER — IMBRUVICA 420 MG PO TABS
1.0000 | ORAL_TABLET | Freq: Every day | ORAL | 0 refills | Status: DC
  Filled 2020-06-13: qty 28, 28d supply, fill #0

## 2020-06-14 ENCOUNTER — Other Ambulatory Visit (HOSPITAL_COMMUNITY): Payer: Self-pay

## 2020-06-29 ENCOUNTER — Telehealth: Payer: Self-pay | Admitting: Internal Medicine

## 2020-06-29 ENCOUNTER — Other Ambulatory Visit: Payer: Self-pay

## 2020-06-29 ENCOUNTER — Other Ambulatory Visit: Payer: Self-pay | Admitting: *Deleted

## 2020-06-29 ENCOUNTER — Ambulatory Visit (HOSPITAL_COMMUNITY): Payer: Medicare HMO | Attending: Cardiology

## 2020-06-29 DIAGNOSIS — I428 Other cardiomyopathies: Secondary | ICD-10-CM

## 2020-06-29 DIAGNOSIS — I519 Heart disease, unspecified: Secondary | ICD-10-CM

## 2020-06-29 LAB — ECHOCARDIOGRAM COMPLETE
Area-P 1/2: 3.12 cm2
MV M vel: 5.51 m/s
MV Peak grad: 121.4 mmHg
P 1/2 time: 403 msec
S' Lateral: 5.1 cm

## 2020-06-29 NOTE — Telephone Encounter (Signed)
Spoke with patient about results. Scheduled for OV on 07/04/20

## 2020-06-29 NOTE — Telephone Encounter (Signed)
Called patient at 5:45 pm on 06/29/20 - relayed echo results. He has been scheduled for follow-up with me on 6/1. Says he feels fine - exercises every day, although LVEF is worse. Advised to continue exercise and current meds, will discuss changes next week -probable heart failure clinic referral.  Dr. Lemmie Evens

## 2020-06-29 NOTE — Telephone Encounter (Signed)
Patient returning call for echo results. 

## 2020-07-04 ENCOUNTER — Encounter: Payer: Self-pay | Admitting: Internal Medicine

## 2020-07-04 ENCOUNTER — Ambulatory Visit (INDEPENDENT_AMBULATORY_CARE_PROVIDER_SITE_OTHER): Payer: Medicare HMO | Admitting: Internal Medicine

## 2020-07-04 ENCOUNTER — Other Ambulatory Visit: Payer: Self-pay

## 2020-07-04 VITALS — BP 132/80 | HR 62 | Ht 70.5 in | Wt 199.8 lb

## 2020-07-04 DIAGNOSIS — Z79899 Other long term (current) drug therapy: Secondary | ICD-10-CM

## 2020-07-04 DIAGNOSIS — I10 Essential (primary) hypertension: Secondary | ICD-10-CM | POA: Diagnosis not present

## 2020-07-04 DIAGNOSIS — I428 Other cardiomyopathies: Secondary | ICD-10-CM

## 2020-07-04 DIAGNOSIS — I480 Paroxysmal atrial fibrillation: Secondary | ICD-10-CM

## 2020-07-04 DIAGNOSIS — C8319 Mantle cell lymphoma, extranodal and solid organ sites: Secondary | ICD-10-CM | POA: Diagnosis not present

## 2020-07-04 MED ORDER — EMPAGLIFLOZIN 10 MG PO TABS: 10.0000 mg | ORAL_TABLET | Freq: Every day | ORAL | 11 refills | Status: DC

## 2020-07-04 NOTE — Progress Notes (Signed)
OFFICE NOTE  Chief Complaint:  Follow-up  Primary Care Physician: Shelda Pal, DO  HPI:  [name] is a 70 y.o. male with a past medial history significant for non-Hodgkin's lymphoma who presented to the emergency room 08/01/2019 with balance issues.  Patient said he had done his usual workout and was doing some work around the house when he noticed his balance was "off".  He also developed some left arm pain.  He had not had this before.  He went to the emergency room.  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  Carotid Dopplers revealed a 1 to 39% right internal carotid artery narrowing and a 60 to 79% left internal carotid artery narrowing.  Echocardiogram revealed cardiomyopathy with an EF of 35 to 40% with global hypokinesis.  He also had grade 1 diastolic dysfunction, moderate left atrial enlargement.  He was noted to have frequent ectopy with PACs and PVCs.  The hospital notes and discharge summary mention atrial fibrillation, this was not mentioned by Dr. Debara Pickett and I could find no evidence of atrial fibrillation by his EKGs.  He did have frequent ectopy.  The patient was placed on aspirin and Plavix although today he tells me he has not been taking aspirin.  His Mevacor was changed to Lipitor 80 mg. His lisinopril HCTZ was changed to Entresto.  Dr. Lysbeth Penner note also mentions adding low-dose carvedilol but this was not added at discharge.  The plan is for outpatient ischemic evaluation with a coronary CT scan.  Since discharge the patient says he is done well.  The patient is an avid exerciser and goes to the gym.  He used to be a runner but now just walks daily for exercise.  I asked him to back off on his weight lifting and push-ups.  I suggested he could do low weights, 10 pounds or so with high reps.  I suggested he continue walking for exercise as tolerated.  He and his wife have gone on a no sodium diet. He denies any LE edema or orthopnea.    09/29/2019  [name] is seen today in follow-up.  He was seen by Kerin Ransom, PA-C for hospital follow-up.  I saw him first in the hospital.  We were trying to work-up his cardiomyopathy as to whether it was ischemic or nonischemic.  Because of some abnormal beats and other features I recommended outpatient stress testing or cardiac CT.  In addition [name] had some orthostatic symptoms and apparently was taken off of Entresto and placed on a beta-blocker.  Those seem to have resolved and vitals however did not indicate orthostasis.  CT coronary angiogram was performed on 09/09/2019 showed a calcium score 145 which was 78th percentile for age and sex matched control.  There was some mild plaque in the RCA minimal plaque in the LAD and circumflex.  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  The aortic valve was also mildly calcified.  I reviewed those findings with him today.  Overall he seems to be feeling well and I would describe his cardiomyopathy as nonischemic.  Blood pressure is somewhat elevated today 156/92 and seems to be top normal to mildly elevated at home as well.  01/02/2020  [name] is seen today in follow-up.  Overall he is doing well.  He seems to be tolerating medication changes.  Blood pressure is very good today 132/80.  He denies any chest pain or  worsening shortness of breath.  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  This is being followed expectantly.  He is currently on some antibiotics.  He did have a lipid profile 2 months ago which showed total cholesterol 124, HDL 41, LDL 67 and triglycerides 84.  07/04/2020  [name] returns today for follow-up.  He recently underwent a repeat echo in hopes that his heart function would have improved on Entresto however unfortunately the echo shows some further decline in his EF.  Weight is come down a good bit in fact he is down about 10 pounds.  He says he exercises daily including aerobic activity and  weightlifting.  He endorses no more than NYHA class I symptoms.  His wife says that he does seem a little more fatigued at times and that might be due to the fact that he has worsening cardiomyopathy.  His repeat echo unfortunately showed a decline in LVEF to 25 to 30% from 40% in 2001.  He is on immunotherapy for mantle cell non-Hodgkin's lymphoma.  I am not sure certain whether this could be contributing to it or not.  PMHx:  Past Medical History:  Diagnosis Date  . Anemia   . Cancer ([place])    Lymphoma  . Essential hypertension   . GERD (gastroesophageal reflux disease)   . GI bleed   . HLD (hyperlipidemia)   . Weakness     Past Surgical History:  Procedure Laterality Date  . ESOPHAGOGASTRODUODENOSCOPY Left 01/18/2015   Procedure: ESOPHAGOGASTRODUODENOSCOPY (EGD);  Surgeon: Wilford Corner, MD;  Location: [facility] ENDOSCOPY;  Service: Endoscopy;  Laterality: Left;  . INGUINAL HERNIA REPAIR Right 01/22/2015   Procedure: RIGHT INGUINAL LYMPH NODE BX;  Surgeon: Donnie Mesa, MD;  Location: [place];  Service: General;  Laterality: Right;  . MEDIASTINOSCOPY N/A 05/05/2017   Procedure: MEDIASTINOSCOPY;  Surgeon: Grace Isaac, MD;  Location: [place];  Service: Thoracic;  Laterality: N/A;  . PORTACATH PLACEMENT  01/26/2015    power port with tip SVC/RA [place]  . SKIN SURGERY     Small benign cysts over left scalp removed  . VIDEO BRONCHOSCOPY WITH ENDOBRONCHIAL ULTRASOUND N/A 05/05/2017   Procedure: VIDEO BRONCHOSCOPY WITH ENDOBRONCHIAL ULTRASOUND;  Surgeon: Grace Isaac, MD;  Location: [facility];  Service: Thoracic;  Laterality: N/A;    FAMHx:  Family History  Problem Relation Age of Onset  . Hypertension Mother   . Hypertension Father   . Kidney failure Father   . Hypertension Sister   . Diabetes Sister   . Prostate cancer Brother   . Lupus Sister     SOCHx:   reports that he has never smoked. He has never used smokeless tobacco. He reports current alcohol use. He reports that he  does not use drugs.  ALLERGIES:  No Known Allergies  ROS: Pertinent items noted in HPI and remainder of comprehensive ROS otherwise negative.  HOME MEDS: Current Outpatient Medications on File Prior to Visit  Medication Sig Dispense Refill  . aspirin 81 MG EC tablet Take 1 tablet (81 mg total) by mouth daily. Swallow whole. 30 tablet 11  . atorvastatin (LIPITOR) 80 MG tablet TAKE 1 TABLET BY MOUTH EVERY DAY 90 tablet 1  . carvedilol (COREG) 3.125 MG tablet Take 1 tablet (3.125 mg total) by mouth 2 (two) times daily with a meal. 180 tablet 3  . ibrutinib (IMBRUVICA) 420 MG TABS TAKE 1 TABLET BY MOUTH DAILY. 28 tablet 0  . sacubitril-valsartan (ENTRESTO) 49-51 MG  Take 1 tablet by mouth 2 (two) times daily. 60 tablet 11   No current facility-administered medications on file prior to visit.    LABS/IMAGING: No results found for this or any previous visit (from the past 48 hour(s)). No results found.  LIPID PANEL:    Component Value Date/Time   CHOL 124 10/21/2019 0904   TRIG 84 10/21/2019 0904   HDL 41 10/21/2019 0904   CHOLHDL 3.0 10/21/2019 0904   CHOLHDL 4.2 08/01/2019 1218   VLDL 18 08/01/2019 1218   LDLCALC 67 10/21/2019 0904     WEIGHTS: Wt Readings from Last 3 Encounters:  07/04/20 199 lb 12.8 oz (90.6 kg)  04/12/20 203 lb 11.2 oz (92.4 kg)  01/12/20 207 lb 8 oz (94.1 kg)    VITALS: BP 132/80 (BP Location: Left Arm, Patient Position: Sitting, Cuff Size: Normal)   Pulse 62   Ht 5' 10.5" (1.791 m)   Wt 199 lb 12.8 oz (90.6 kg)   BMI 28.26 kg/m   EXAM: General appearance: alert and no distress Neck: no carotid bruit, no JVD and thyroid not enlarged, symmetric, no tenderness/mass/nodules Lungs: clear to auscultation bilaterally Heart: regular rate and rhythm, S1, S2 normal, no murmur, click, rub or gallop Abdomen: soft, non-tender; bowel sounds normal; no masses,  no organomegaly Extremities: extremities normal, atraumatic, no cyanosis or edema Pulses: 2+ and  symmetric Skin: Skin color, texture, turgor normal. No rashes or lesions Neurologic: Grossly normal Psych: Pleasant  EKG: Deferred  ASSESSMENT: 1. 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 Mild nonobstructive coronary disease by cardiac CTA (09/2019)-CAC score of 145 3. Dilated ascending aorta to 4.3 cm 4. Recent stroke 5. Ectopy with no evidence of atrial fibrillation by ZIO monitor 6. Hypertension 7. Dyslipidemia 8. Mantle cell lymphoma 9. 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   [name] continues to have NYHA Class I symptoms, however, his LVEF continues to decline - ? If this is related to Imbruvica or his mantle cell NHL. Will add Jaridance 10 mg daily to his regimen and refer to advanced HF clinic for further evaluation. He is agreeable to this plan. Advised adequate hydration and monitoring for UTI/perineal infection symptoms. Check BMET in 2 weeks. Follow-up with me in 6 months since he will be seeing HF.  Pixie Casino, MD, [facility], [place] Director of the Advanced Lipid Disorders &  Cardiovascular Risk Reduction Clinic Diplomate of the American Board of Clinical Lipidology Attending Cardiologist  Direct Dial: [phone]  Fax: [phone]  Website:  [email or web address] 07/04/2020, 3:29 PM

## 2020-07-04 NOTE — Patient Instructions (Signed)
Medication Instructions:  START Jardiance 10mg  daily  *If you need a refill on your cardiac medications before your next appointment, please call your pharmacy*   Lab Work: BMET (non-fasting) -- due in 2 weeks If you have labs (blood work) drawn today and your tests are completely normal, you will receive your results only by: Marland Kitchen MyChart Message (if you have MyChart) OR . A paper copy in the mail If you have any lab test that is abnormal or we need to change your treatment, we will call you to review the results.   Testing/Procedures: NONE   Follow-Up: At [facility], you and your health needs are our priority.  As part of our continuing mission to provide you with exceptional heart care, we have created designated Provider Care Teams.  These Care Teams include your primary Cardiologist (physician) and Advanced Practice Providers (APPs -  Physician Assistants and Nurse Practitioners) who all work together to provide you with the care you need, when you need it.  We recommend signing up for the patient portal called "MyChart".  Sign up information is provided on this After Visit Summary.  MyChart is used to connect with patients for Virtual Visits (Telemedicine).  Patients are able to view lab/test results, encounter notes, upcoming appointments, etc.  Non-urgent messages can be sent to your provider as well.   To learn more about what you can do with MyChart, go to [email or web address].    Your next appointment:   6 month(s)  The format for your next appointment:   In Person  Provider:   You may see Pixie Casino, MD or one of the following Advanced Practice Providers on your designated Care Team:    Almyra Deforest, PA-C  Fabian Sharp, [place] or   Roby Lofts, [place]    Other Instructions Heart Failure Clinic should contact you about an appointment

## 2020-07-10 ENCOUNTER — Other Ambulatory Visit: Payer: Self-pay | Admitting: Oncology

## 2020-07-10 ENCOUNTER — Other Ambulatory Visit (HOSPITAL_COMMUNITY): Payer: Self-pay

## 2020-07-10 DIAGNOSIS — C8319 Mantle cell lymphoma, extranodal and solid organ sites: Secondary | ICD-10-CM

## 2020-07-11 ENCOUNTER — Other Ambulatory Visit (HOSPITAL_COMMUNITY): Payer: Self-pay

## 2020-07-12 ENCOUNTER — Other Ambulatory Visit: Payer: Self-pay | Admitting: Oncology

## 2020-07-12 ENCOUNTER — Other Ambulatory Visit (HOSPITAL_COMMUNITY): Payer: Self-pay

## 2020-07-12 DIAGNOSIS — C8319 Mantle cell lymphoma, extranodal and solid organ sites: Secondary | ICD-10-CM

## 2020-07-12 MED ORDER — IMBRUVICA 420 MG PO TABS
1.0000 | ORAL_TABLET | Freq: Every day | ORAL | 3 refills | Status: DC
  Filled 2020-07-12: qty 28, 28d supply, fill #0
  Filled 2020-08-09: qty 28, 28d supply, fill #1
  Filled 2020-09-04: qty 28, 28d supply, fill #2
  Filled 2020-10-02: qty 28, 28d supply, fill #3

## 2020-07-13 ENCOUNTER — Other Ambulatory Visit (HOSPITAL_COMMUNITY): Payer: Self-pay

## 2020-08-02 ENCOUNTER — Other Ambulatory Visit (HOSPITAL_COMMUNITY): Payer: Self-pay

## 2020-08-07 ENCOUNTER — Other Ambulatory Visit (HOSPITAL_COMMUNITY): Payer: Self-pay

## 2020-08-08 ENCOUNTER — Ambulatory Visit (INDEPENDENT_AMBULATORY_CARE_PROVIDER_SITE_OTHER): Payer: Medicare HMO | Admitting: Family Medicine

## 2020-08-08 ENCOUNTER — Encounter: Payer: Self-pay | Admitting: Family Medicine

## 2020-08-08 ENCOUNTER — Other Ambulatory Visit: Payer: Self-pay

## 2020-08-08 VITALS — BP 120/78 | HR 47 | Temp 98.2°F | Ht 70.0 in | Wt 192.0 lb

## 2020-08-08 DIAGNOSIS — E782 Mixed hyperlipidemia: Secondary | ICD-10-CM | POA: Diagnosis not present

## 2020-08-08 DIAGNOSIS — Z0000 Encounter for general adult medical examination without abnormal findings: Secondary | ICD-10-CM

## 2020-08-08 DIAGNOSIS — I779 Disorder of arteries and arterioles, unspecified: Secondary | ICD-10-CM | POA: Diagnosis not present

## 2020-08-08 DIAGNOSIS — Z1211 Encounter for screening for malignant neoplasm of colon: Secondary | ICD-10-CM | POA: Diagnosis not present

## 2020-08-08 DIAGNOSIS — Z23 Encounter for immunization: Secondary | ICD-10-CM | POA: Diagnosis not present

## 2020-08-08 LAB — LIPID PANEL
Cholesterol: 122 mg/dL (ref 0–200)
HDL: 45.4 mg/dL (ref >39.00)
LDL Cholesterol: 55 mg/dL (ref 0–99)
NonHDL: 76.16
Total CHOL/HDL Ratio: 3
Triglycerides: 106 mg/dL (ref 0.0–149.0)
VLDL: 21.2 mg/dL (ref 0.0–40.0)

## 2020-08-08 LAB — COMPREHENSIVE METABOLIC PANEL
ALT: 23 U/L (ref 0–53)
AST: 24 U/L (ref 0–37)
Albumin: 4.3 g/dL (ref 3.5–5.2)
Alkaline Phosphatase: 33 U/L — ABNORMAL LOW (ref 39–117)
BUN: 20 mg/dL (ref 6–23)
CO2: 27 mEq/L (ref 19–32)
Calcium: 9.1 mg/dL (ref 8.4–10.5)
Chloride: 105 mEq/L (ref 96–112)
Creatinine, Ser: 1.17 mg/dL (ref 0.40–1.50)
GFR: 64.44 mL/min (ref >60.00)
Glucose, Bld: 73 mg/dL (ref 70–99)
Potassium: 4.1 mEq/L (ref 3.5–5.1)
Sodium: 140 mEq/L (ref 135–145)
Total Bilirubin: 0.9 mg/dL (ref 0.2–1.2)
Total Protein: 6.1 g/dL (ref 6.0–8.3)

## 2020-08-08 NOTE — Patient Instructions (Addendum)
Give [place] 2-3 business days to get the results of your labs back.   Keep the diet clean and stay active.  If you do not hear anything about your referral in the next 1-2 weeks, call our office and ask for an update.  OK to use Debrox (peroxide) in the ear to loosen up wax. Also recommend using a bulb syringe (for removing boogers from baby's noses) to flush through warm water and vinegar (3-4:1 ratio). An alternative, though more expensive, is an elephant ear washer wax removal kit. Do not use Q-tips as this can impact wax further.  Let us know if you need anything.

## 2020-08-08 NOTE — Progress Notes (Signed)
Chief Complaint  Patient presents with   Annual Exam    Ear fullness     Well Male [name] is here for a complete physical.   His last physical was >1 year ago.  Current diet: in general, a "healthy" diet.   Current exercise: running, lifting weights Weight trend: stable Fatigue out of ordinary? No. Seat belt? Yes.    Health maintenance Shingrix- No Colonoscopy- No Tetanus- No Hep C- Yes Pneumonia vaccine- Yes  Past Medical History:  Diagnosis Date   Anemia    Cancer ([place])    Lymphoma   Essential hypertension    GERD (gastroesophageal reflux disease)    GI bleed    HLD (hyperlipidemia)    Weakness      Past Surgical History:  Procedure Laterality Date   ESOPHAGOGASTRODUODENOSCOPY Left 01/18/2015   Procedure: ESOPHAGOGASTRODUODENOSCOPY (EGD);  Surgeon: Wilford Corner, MD;  Location: [facility] ENDOSCOPY;  Service: Endoscopy;  Laterality: Left;   INGUINAL HERNIA REPAIR Right 01/22/2015   Procedure: RIGHT INGUINAL LYMPH NODE BX;  Surgeon: Donnie Mesa, MD;  Location: [place];  Service: General;  Laterality: Right;   MEDIASTINOSCOPY N/A 05/05/2017   Procedure: MEDIASTINOSCOPY;  Surgeon: Grace Isaac, MD;  Location: [facility] OR;  Service: Thoracic;  Laterality: N/A;   PORTACATH PLACEMENT  01/26/2015    power port with tip SVC/RA [place]   SKIN SURGERY     Small benign cysts over left scalp removed   VIDEO BRONCHOSCOPY WITH ENDOBRONCHIAL ULTRASOUND N/A 05/05/2017   Procedure: VIDEO BRONCHOSCOPY WITH ENDOBRONCHIAL ULTRASOUND;  Surgeon: Grace Isaac, MD;  Location: [facility] OR;  Service: Thoracic;  Laterality: N/A;    Medications  Current Outpatient Medications on File Prior to Visit  Medication Sig Dispense Refill   aspirin 81 MG EC tablet Take 1 tablet (81 mg total) by mouth daily. Swallow whole. 30 tablet 11   atorvastatin (LIPITOR) 80 MG tablet TAKE 1 TABLET BY MOUTH EVERY DAY 90 tablet 1   carvedilol (COREG) 3.125 MG tablet Take 1 tablet (3.125 mg total) by mouth 2  (two) times daily with a meal. 180 tablet 3   empagliflozin (JARDIANCE) 10 MG TABS tablet Take 1 tablet (10 mg total) by mouth daily before breakfast. 30 tablet 11   ibrutinib (IMBRUVICA) 420 MG TABS TAKE 1 TABLET BY MOUTH DAILY. 28 tablet 3   sacubitril-valsartan (ENTRESTO) 49-51 MG Take 1 tablet by mouth 2 (two) times daily. 60 tablet 11   Allergies No Known Allergies  Family History Family History  Problem Relation Age of Onset   Hypertension Mother    Hypertension Father    Kidney failure Father    Hypertension Sister    Diabetes Sister    Prostate cancer Brother    Lupus Sister     Review of Systems: Constitutional:  no fevers Eye:  no recent significant change in vision Ears:  No changes in hearing Nose/Mouth/Throat:  no complaints of nasal congestion, no sore throat Cardiovascular: no chest pain Respiratory:  No shortness of breath Gastrointestinal:  No change in bowel habits GU:  No frequency Integumentary:  no abnormal skin lesions reported Neurologic:  no headaches Endocrine:  denies unexplained weight changes  Exam BP 120/78   Pulse (!) 47   Temp 98.2 F (36.8 C) (Oral)   Ht 5\' 10"  (1.778 m)   Wt 192 lb (87.1 kg)   SpO2 97%   BMI 27.55 kg/m  General:  well developed, well nourished, in no apparent distress Skin:  no significant  moles, warts, or growths Head:  no masses, lesions, or tenderness Eyes:  pupils equal and round, sclera anicteric without injection Ears:  canals without lesions, TMs shiny without retraction, no obvious effusion, no erythema Nose:  nares patent, septum midline, mucosa normal Throat/Pharynx:  lips and gingiva without lesion; tongue and uvula midline; non-inflamed pharynx; no exudates or postnasal drainage Lungs:  clear to auscultation, breath sounds equal bilaterally, no respiratory distress Cardio:  regular rate and rhythm, no LE edema or bruits Rectal: Deferred GI: BS+, S, NT, ND, no masses or organomegaly Musculoskeletal:   symmetrical muscle groups noted without atrophy or deformity Neuro:  gait normal; deep tendon reflexes normal and symmetric Psych: well oriented with normal range of affect and appropriate judgment/insight  Assessment and Plan  Well adult exam  Mixed hyperlipidemia - Plan: Comprehensive metabolic panel, Lipid panel  Carotid artery disease, unspecified laterality, unspecified type ([place]), Chronic  Screen for colon cancer - Plan: Ambulatory referral to Gastroenterology   Well 70 y.o. male. Counseled on diet and exercise. Unable to get Shingrix 2/2 ins.  CCS referral placed to GI. PCV20 today.  Other orders as above. Follow up in 1 yr or prn.  The patient voiced understanding and agreement to the plan.  [place], DO 08/08/20 11:14 AM

## 2020-08-08 NOTE — Addendum Note (Signed)
Addended by: Sharon Seller B on: 08/08/2020 11:51 AM   Modules accepted: Orders

## 2020-08-09 ENCOUNTER — Other Ambulatory Visit: Payer: Self-pay

## 2020-08-09 ENCOUNTER — Other Ambulatory Visit (HOSPITAL_COMMUNITY): Payer: Self-pay

## 2020-08-09 MED ORDER — CARVEDILOL 3.125 MG PO TABS: 3.1250 mg | ORAL_TABLET | Freq: Two times a day (BID) | ORAL | 3 refills | Status: DC

## 2020-08-13 ENCOUNTER — Encounter: Payer: Self-pay | Admitting: Oncology

## 2020-08-13 ENCOUNTER — Other Ambulatory Visit (HOSPITAL_COMMUNITY): Payer: Self-pay

## 2020-08-17 ENCOUNTER — Encounter: Payer: Self-pay | Admitting: Gastroenterology

## 2020-08-23 ENCOUNTER — Other Ambulatory Visit: Payer: Self-pay

## 2020-08-23 ENCOUNTER — Ambulatory Visit (HOSPITAL_COMMUNITY)
Admission: RE | Admit: 2020-08-23 | Discharge: 2020-08-23 | Disposition: A | Discharge status: Home / Self Care | Payer: Medicare HMO | Source: Ambulatory Visit | Attending: Cardiology | Admitting: Cardiology

## 2020-08-23 ENCOUNTER — Encounter (HOSPITAL_COMMUNITY): Payer: Self-pay | Admitting: Cardiology

## 2020-08-23 VITALS — BP 110/70 | HR 59 | Wt 191.4 lb

## 2020-08-23 DIAGNOSIS — Z8673 Personal history of transient ischemic attack (TIA), and cerebral infarction without residual deficits: Secondary | ICD-10-CM | POA: Insufficient documentation

## 2020-08-23 DIAGNOSIS — I498 Other specified cardiac arrhythmias: Secondary | ICD-10-CM | POA: Insufficient documentation

## 2020-08-23 DIAGNOSIS — I6329 Cerebral infarction due to unspecified occlusion or stenosis of other precerebral arteries: Secondary | ICD-10-CM

## 2020-08-23 DIAGNOSIS — I2510 Atherosclerotic heart disease of native coronary artery without angina pectoris: Secondary | ICD-10-CM | POA: Diagnosis not present

## 2020-08-23 DIAGNOSIS — Z9221 Personal history of antineoplastic chemotherapy: Secondary | ICD-10-CM | POA: Diagnosis not present

## 2020-08-23 DIAGNOSIS — I4891 Unspecified atrial fibrillation: Secondary | ICD-10-CM

## 2020-08-23 DIAGNOSIS — I639 Cerebral infarction, unspecified: Secondary | ICD-10-CM

## 2020-08-23 DIAGNOSIS — Z8249 Family history of ischemic heart disease and other diseases of the circulatory system: Secondary | ICD-10-CM | POA: Diagnosis not present

## 2020-08-23 DIAGNOSIS — I6322 Cerebral infarction due to unspecified occlusion or stenosis of basilar arteries: Secondary | ICD-10-CM | POA: Diagnosis not present

## 2020-08-23 DIAGNOSIS — I493 Ventricular premature depolarization: Secondary | ICD-10-CM

## 2020-08-23 DIAGNOSIS — Z79899 Other long term (current) drug therapy: Secondary | ICD-10-CM | POA: Diagnosis not present

## 2020-08-23 DIAGNOSIS — Z7984 Long term (current) use of oral hypoglycemic drugs: Secondary | ICD-10-CM | POA: Diagnosis not present

## 2020-08-23 DIAGNOSIS — Z8572 Personal history of non-Hodgkin lymphomas: Secondary | ICD-10-CM | POA: Diagnosis not present

## 2020-08-23 DIAGNOSIS — I428 Other cardiomyopathies: Secondary | ICD-10-CM | POA: Insufficient documentation

## 2020-08-23 DIAGNOSIS — Z7982 Long term (current) use of aspirin: Secondary | ICD-10-CM | POA: Insufficient documentation

## 2020-08-23 DIAGNOSIS — I5022 Chronic systolic (congestive) heart failure: Secondary | ICD-10-CM | POA: Insufficient documentation

## 2020-08-23 DIAGNOSIS — I6529 Occlusion and stenosis of unspecified carotid artery: Secondary | ICD-10-CM

## 2020-08-23 DIAGNOSIS — I429 Cardiomyopathy, unspecified: Secondary | ICD-10-CM

## 2020-08-23 DIAGNOSIS — E785 Hyperlipidemia, unspecified: Secondary | ICD-10-CM | POA: Insufficient documentation

## 2020-08-23 MED ORDER — SPIRONOLACTONE 25 MG PO TABS: 12.5000 mg | ORAL_TABLET | Freq: Every day | ORAL | 3 refills | Status: DC

## 2020-08-23 MED ORDER — CARVEDILOL 6.25 MG PO TABS: 6.2500 mg | ORAL_TABLET | Freq: Two times a day (BID) | ORAL | 3 refills | Status: DC

## 2020-08-23 NOTE — Progress Notes (Signed)
PCP: Shelda Pal, DO Cardiology: Dr. Debara Pickett HF Cardiology: Dr. Aundra Dubin  70 y.o. with history of mantle cell lymphoma, CVA, PACs, and chronic systolic CHF was referred by Dr. Debara Pickett for followup of CHF.  Patient was diagnosed with mantle cell lymphoma in 2017 and started CHOP-R in 6/17.  He is currently on ibrutinib with good control of lymphoma.  In 6/21, he was admitted with CVA.  Imaging showed acute right pontine infarct but also chronic right mid frontal lobe infarct and superior left cerebellar infarct.  Carotid dopplers showed moderate LICA stenosis, but carotid stenosis was not thought to be the etiology of CVAs.  He worse a Zio patch for 2 wks, showing frequent PACs but no AF.   During the 6/21 hospitalization, echo showed EF 35-40%.  Coronary CT angiogram in 8/21 showed mild nonobstructive disease.  Most recent echo in 5/22 showed further deterioration in LV function with EF 25-30%, moderate LV dilation, and normal RV.    Despite cardiomyopathy, patient has been doing well symptomatically.  He is able to jog 2 miles without dyspnea.  He lifts weights most days.  No orthopnea/PND.  His wife feels like he has had some memory issues since his CVA.  No chest pain.  No lightheadedness.  He does not feel palpitations.  He is retired.  No ETOH or smoking.  His father and 2 brothers have CHF of uncertain etiology. He has 3 children who are healthy.   ECG (personally reviewed): NSR, nonspecific T wave changes, PACs  Labs (7/22): LDL 55, K 4.1, creatinine 1.17  PMH: 1. Non-hodgkin's lymphoma: Mantle cell lymphoma.  He was treated with CHOP-R starting 6/17.  Currently on ibrutinib.  2. Carotid stenosis: Carotid dopplers (8/93) with 73-42% LICA stenosis.  3. CVA (6/21): Acute right pontine infarct but also with chronic right mid frontal lobe infarct and superior left cerebellar infarct.  Carotid stenosis was not thought to be the etiology.   - 2 wk Zio patch in 7/21 showed frequent PACs but no  atrial fibrillation.  4. PACs: Frequent.  5. 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  - Echo (6/21): EF 35-40% - Coronary CT angiography (8/21): mild nonobstructive CAD, ascending aorta 4.3 cm - Echo (5/22): EF 25-30%, moderate LV dilation, mild AI, normal RV.  6. 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  7. Duodenal ulcer with GI bleeding  Social History   Socioeconomic History   Marital status: Married    Spouse name: Not on file   Number of children: Not on file   Years of education: Not on file   Highest education level: Not on file  Occupational History   Occupation: Retired  Tobacco Use   Smoking status: Never   Smokeless tobacco: Never  Vaping Use   Vaping Use: Never used  Substance and Sexual Activity   Alcohol use: Yes    Comment: occasional beer   Drug use: No   Sexual activity: Not on file  Other Topics Concern   Not on file  Social History Narrative   Not on file   Social Determinants of Health   Financial Resource Strain: Not on file  Food Insecurity: Not on file  Transportation Needs: Not on file  Physical Activity: Not on file  Stress: Not on file  Social Connections: Not on file  Intimate Partner Violence: Not on file   Family History  Problem Relation Age of Onset   Hypertension Mother    Hypertension Father  Kidney failure Father    Hypertension Sister    Diabetes Sister    Prostate cancer Brother    Lupus Sister    ROS: All systems reviewed and negative except as per HPI.  Current Outpatient Medications  Medication Sig Dispense Refill   aspirin 81 MG EC tablet Take 1 tablet (81 mg total) by mouth daily. Swallow whole. 30 tablet 11   atorvastatin (LIPITOR) 80 MG tablet TAKE 1 TABLET BY MOUTH EVERY DAY 90 tablet 1   empagliflozin (JARDIANCE) 10 MG TABS tablet Take 1 tablet (10 mg total) by mouth daily before breakfast. 30 tablet 11   ibrutinib (IMBRUVICA) 420 MG TABS TAKE 1 TABLET BY MOUTH DAILY. 28  tablet 3   sacubitril-valsartan (ENTRESTO) 49-51 MG Take 1 tablet by mouth 2 (two) times daily. 60 tablet 11   spironolactone (ALDACTONE) 25 MG tablet Take 0.5 tablets (12.5 mg total) by mouth daily. 45 tablet 3   carvedilol (COREG) 6.25 MG tablet Take 1 tablet (6.25 mg total) by mouth 2 (two) times daily with a meal. 180 tablet 3   No current facility-administered medications for this encounter.   BP 110/70   Pulse (!) 59   Wt 86.8 kg (191 lb 6.4 oz)   SpO2 97%   BMI 27.46 kg/m  General: NAD Neck: No JVD, no thyromegaly or thyroid nodule.  Lungs: Clear to auscultation bilaterally with normal respiratory effort. CV: Nondisplaced PMI.  Heart regular S1/S2, no S3/S4, no murmur.  No peripheral edema.  No carotid bruit.  Normal pedal pulses.  Abdomen: Soft, nontender, no hepatosplenomegaly, no distention.  Skin: Intact without lesions or rashes.  Neurologic: Alert and oriented x 3.  Psych: Normal affect. Extremities: No clubbing or cyanosis.  HEENT: Normal.   Assessment/Plan: 1. Chronic systolic CHF: Nonischemic cardiomyopathy.  Coronary CTA in 8/21 with mild nonobstructive CAD.  Most recent echo in 5/22 with EF 25-30%, moderate LV dilation, normal RV.  I suspect the most likely cause of his cardiomyopathy is prior doxorubicin chemotherapy with initial treatment of his mantle cell lymphoma.  However, there have also been multiple family members with CHF, apparently of uncertain etiology so cannot rule out a role for genetic cardiomyopathy.  Less likely viral myocarditis but possible.  On exam, he is not volume overloaded.  NYHA class I symptoms.   - I will arrange for cardiac MRI to assess for infiltrative disease/prior myocarditis.  - I offered [facility] gene testing given family history of CMP.  He has 3 children.  He will think about it.  - Increase Coreg to 6.25 mg bid.  - Add spironolactone 12.5 mg daily. BMET in 10 days.  - Continue empagliflozin 10 mg daily.  - Continue Entresto 49/51  bid.  2. CVA: Patient had acute CVA in 6/21, imaging also showed evidence for prior CVAs as well.  Cause uncertain.  He had moderate LICA stenosis but this was thought to be a bystander by neurology.  He wore a Zio patch monitor for only 2 weeks, this showed frequent PACs but no AF.  Given evidence for multiple CVAs, frequent PACs, ibrutinib use, and cardiomyopathy, I think that he is at fairly high risk for atrial fibrillation.  - I think that he should have a LINQ monitor placed.  I do not want to miss atrial fibrillation as the cause of his CVAs. I will refer to EP for this.  3. Carotid stenosis: He is due for repeat carotid dopplers, will arrange.  4. Hyperlipidemia: Goal LDL <  70.  - Continue atorvastatin, good lipids in 7/22.  5. PACs: Ibutrinib does tend to increase risk for atrial and ventricular arrhythmias.   Followup in 3 wks with HF pharmacist for med titration, see me in 6 wks.   Loralie Champagne 08/24/2020

## 2020-08-23 NOTE — Patient Instructions (Addendum)
EKG done today.  No Labs done today.   START Spironolactone 12.5mg  (1/2 tablet) by mouth daily.  INCREASE Carvedilol to 6.25(1 tablet) by mouth 2 times daily.   No other medication changes were made. Please continue all current medications as prescribed.  Your physician has requested that you have a cardiac MRI. Cardiac MRI uses a computer to create images of your heart as its beating, producing both still and moving pictures of your heart and major blood vessels. This has to be approved through your insurance company prior to scheduling, once approved we will contact you to schedule an appointment.   You have been referred to Cardiac electrophysiology at [address]. They will contact you to schedule an appointment.  Your physician has requested that you have a carotid duplex. This test is an ultrasound of the carotid arteries in your neck. It looks at blood flow through these arteries that supply the brain with blood. Allow one hour for this exam. There are no restrictions or special instructions. Someone will contact you to schedule an appointment.  Your physician recommends that you schedule a follow-up appointment in: 10 days for a lab only appointment, 3 weeks with out pharmacist and in 6 weeks with Dr. Aundra Dubin.  If you have any questions or concerns before your next appointment please send [place] a message through [place] or call our office at [phone].    TO LEAVE A MESSAGE FOR THE NURSE SELECT OPTION 2, PLEASE LEAVE A MESSAGE INCLUDING: YOUR NAME DATE OF BIRTH CALL BACK NUMBER REASON FOR CALL**this is important as we prioritize the call backs  YOU WILL RECEIVE A CALL BACK THE SAME DAY AS LONG AS YOU CALL BEFORE 4:00 PM   Do the following things EVERYDAY: Weigh yourself in the morning before breakfast. Write it down and keep it in a log. Take your medicines as prescribed Eat low salt foods--Limit salt (sodium) to 2000 mg per day.  Stay as active as you can everyday Limit all  fluids for the day to less than 2 liters   At the [facility], you and your health needs are our priority. As part of our continuing mission to provide you with exceptional heart care, we have created designated Provider Care Teams. These Care Teams include your primary Cardiologist (physician) and Advanced Practice Providers (APPs- Physician Assistants and Nurse Practitioners) who all work together to provide you with the care you need, when you need it.   You may see any of the following providers on your designated Care Team at your next follow up: Dr Glori Bickers Dr Haynes Kerns, NP Lyda Jester, [place] Audry Riles, PharmD   Please be sure to bring in all your medications bottles to every appointment.

## 2020-08-29 ENCOUNTER — Other Ambulatory Visit: Payer: Self-pay | Admitting: Family Medicine

## 2020-08-30 ENCOUNTER — Encounter: Payer: Medicare HMO | Admitting: Gastroenterology

## 2020-09-03 ENCOUNTER — Ambulatory Visit (HOSPITAL_COMMUNITY)
Admission: RE | Admit: 2020-09-03 | Discharge: 2020-09-03 | Disposition: A | Discharge status: Home / Self Care | Payer: Medicare HMO | Source: Ambulatory Visit | Attending: Cardiology | Admitting: Cardiology

## 2020-09-03 ENCOUNTER — Other Ambulatory Visit: Payer: Self-pay

## 2020-09-03 DIAGNOSIS — I429 Cardiomyopathy, unspecified: Secondary | ICD-10-CM | POA: Insufficient documentation

## 2020-09-03 LAB — CBC
HCT: 41.9 % (ref 39.0–52.0)
Hemoglobin: 13.4 g/dL (ref 13.0–17.0)
MCH: 28.5 pg (ref 26.0–34.0)
MCHC: 32 g/dL (ref 30.0–36.0)
MCV: 89 fL (ref 80.0–100.0)
Platelets: 281 10*3/uL (ref 150–400)
RBC: 4.71 MIL/uL (ref 4.22–5.81)
RDW: 13.9 % (ref 11.5–15.5)
WBC: 4.1 10*3/uL (ref 4.0–10.5)
nRBC: 0 % (ref 0.0–0.2)

## 2020-09-03 LAB — BASIC METABOLIC PANEL
Anion gap: 6 (ref 5–15)
BUN: 24 mg/dL — ABNORMAL HIGH (ref 8–23)
CO2: 27 mmol/L (ref 22–32)
Calcium: 9 mg/dL (ref 8.9–10.3)
Chloride: 103 mmol/L (ref 98–111)
Creatinine, Ser: 1.3 mg/dL — ABNORMAL HIGH (ref 0.61–1.24)
GFR, Estimated: >60 mL/min (ref >60)
Glucose, Bld: 90 mg/dL (ref 70–99)
Potassium: 4.9 mmol/L (ref 3.5–5.1)
Sodium: 136 mmol/L (ref 135–145)

## 2020-09-04 ENCOUNTER — Other Ambulatory Visit (HOSPITAL_COMMUNITY): Payer: Self-pay

## 2020-09-06 ENCOUNTER — Ambulatory Visit (HOSPITAL_COMMUNITY)
Admission: RE | Admit: 2020-09-06 | Discharge: 2020-09-06 | Disposition: A | Discharge status: Home / Self Care | Payer: Medicare HMO | Source: Ambulatory Visit | Attending: Cardiology | Admitting: Cardiology

## 2020-09-06 ENCOUNTER — Other Ambulatory Visit (HOSPITAL_COMMUNITY): Payer: Self-pay

## 2020-09-06 ENCOUNTER — Other Ambulatory Visit: Payer: Self-pay

## 2020-09-06 DIAGNOSIS — I6529 Occlusion and stenosis of unspecified carotid artery: Secondary | ICD-10-CM | POA: Diagnosis not present

## 2020-09-06 DIAGNOSIS — I6522 Occlusion and stenosis of left carotid artery: Secondary | ICD-10-CM

## 2020-09-10 NOTE — Progress Notes (Signed)
PCP: Shelda Pal, DO Cardiology: Dr. Debara Pickett HF Cardiology: Dr. Aundra Dubin  HPI:  70 y.o. with history of mantle cell lymphoma, CVA, PACs, and chronic systolic CHF was referred by Dr. Debara Pickett for followup of CHF.  Patient was diagnosed with mantle cell lymphoma in 2017 and started CHOP-R in 07/2015.  He is currently on ibrutinib with good control of lymphoma.  In 07/2019, he was admitted with CVA.  Imaging showed acute right pontine infarct but also chronic right mid frontal lobe infarct and superior left cerebellar infarct.  Carotid dopplers showed moderate LICA stenosis, but carotid stenosis was not thought to be the etiology of CVAs.  He wore a Zio patch for 2 wks, showing frequent PACs but no AF.   During the 07/2019 hospitalization, echo showed EF 35-40%.  Coronary CT angiogram in 09/2019 showed mild nonobstructive disease.  Most recent echo in 06/2020 showed further deterioration in LV function with EF 25-30%, moderate LV dilation, and normal RV.     Recently presented to HF Clinic on 08/23/20 with Dr. Aundra Dubin. Despite cardiomyopathy, patient had been doing well symptomatically.  He reported being able to jog 2 miles without dyspnea.  He lifted weights most days.  No orthopnea/PND.  His wife felt like he had had some memory issues since his CVA.  No chest pain.  No lightheadedness.  He reported not feeling palpitations.  He is retired.  No ETOH or smoking.  His father and 2 brothers have CHF of uncertain etiology. He has 3 children who are healthy.  Today he returns to HF clinic for pharmacist medication titration. At last visit with MD, carvedilol was increased to 6.25 mg BID and spironolactone 12.5 mg daily was initiated. Overall is feeling well today. No dizziness, lightheadedness, chest pain or palpitations. No SOB/DOE. Remains very active. Jogged 3 miles this morning and lifted weights. Also works in his yard. Weight has been stable. Does not take any diuretics. No LEE, PND or orthopnea. Appetite  has been good. Does not follow a low salt diet. Taking all medications as prescribed and tolerating all medications.    HF Medications: Carvedilol 6.25 mg BID Entresto 49/51 mg BID Spironolactone 12.5 mg daily Jardiance 10 mg daily  Has the patient been experiencing any side effects to the medications prescribed?  no  Does the patient have any problems obtaining medications due to transportation or finances?  Has [employer]. Obtained PAN grant today for help with Jardiance and Entresto copays.   Understanding of regimen: good Understanding of indications: good Potential of compliance: good Patient understands to avoid NSAIDs. Patient understands to avoid decongestants.    Pertinent Lab Values: 09/03/20: Serum creatinine 1.30, BUN 24, Potassium 4.9, Sodium 136  Vital Signs: Weight: 190.8 lbs (last clinic weight: 191.4 lbs) Blood pressure: 116/68  Heart rate: 66   Assessment/Plan: 1. Chronic systolic CHF: Nonischemic cardiomyopathy.  Coronary CTA in 09/2019 with mild nonobstructive CAD.  Most recent echo in 06/2020 with EF 25-30%, moderate LV dilation, normal RV.  Most likely cause of his cardiomyopathy is prior doxorubicin chemotherapy with initial treatment of his mantle cell lymphoma.  However, there have also been multiple family members with CHF, apparently of uncertain etiology so cannot rule out a role for genetic cardiomyopathy.  Less likely viral myocarditis but possible.   - On exam, he is not volume overloaded.  NYHA class I symptoms.   - Referred for cardiac MRI to assess for infiltrative disease/prior myocarditis. Scheduled 10/16/20. - Previously offered [facility] gene testing  given family history of CMP.  He has 3 children.  He will think about it. - Continue carvedilol 6.25 mg BID. - Increase Entresto to 97/103 mg BID. Repeat BMET in 1 week at clinic visit.  - Continue spironolactone 12.5 mg daily. - Continue empagliflozin 10 mg daily. 2. CVA: Patient had  acute CVA in 07/2019, imaging also showed evidence for prior CVAs as well.  Cause uncertain.  He had moderate LICA stenosis but this was thought to be a bystander by neurology.  He wore a Zio patch monitor for only 2 weeks, this showed frequent PACs but no AF.  Given evidence for multiple CVAs, frequent PACs, ibrutinib use, and cardiomyopathy,  he is at fairly high risk for atrial fibrillation. - Dr. Aundra Dubin referred him to EP last visit to have a LINQ monitor placed.  Did not want to miss atrial fibrillation as the cause of his CVAs.  3. Carotid stenosis:  - 09/06/20: carotid dopplers showed moderate LICA stenosis, need repeat carotid dopplers in 1 year 4. Hyperlipidemia: Goal LDL < 70. - Continue atorvastatin, good lipids in 08/2020. 5. PACs: Ibutrinib does tend to increase risk for atrial and ventricular arrhythmias.   Return to HF Clinic in 1 week with Dr. Aundra Dubin.   Audry Riles, PharmD, BCPS, BCCP, CPP Heart Failure Clinic Pharmacist [phone]

## 2020-09-13 ENCOUNTER — Telehealth (HOSPITAL_COMMUNITY): Payer: Self-pay | Admitting: *Deleted

## 2020-09-13 ENCOUNTER — Encounter: Payer: Medicare HMO | Admitting: Gastroenterology

## 2020-09-13 NOTE — Telephone Encounter (Signed)
CMRI auth in clinical review

## 2020-09-13 NOTE — Telephone Encounter (Signed)
CM[employer] Josem Kaufmann [id number] exp. 10/19/20. Msg sent to julia piercy to schedule.

## 2020-09-24 ENCOUNTER — Ambulatory Visit (HOSPITAL_COMMUNITY)
Admission: RE | Admit: 2020-09-24 | Discharge: 2020-09-24 | Disposition: A | Discharge status: Home / Self Care | Payer: Medicare HMO | Source: Ambulatory Visit | Attending: Cardiology | Admitting: Cardiology

## 2020-09-24 ENCOUNTER — Telehealth (HOSPITAL_COMMUNITY): Payer: Self-pay | Admitting: Pharmacist

## 2020-09-24 ENCOUNTER — Other Ambulatory Visit: Payer: Self-pay

## 2020-09-24 VITALS — BP 116/68 | HR 66 | Wt 190.8 lb

## 2020-09-24 DIAGNOSIS — E785 Hyperlipidemia, unspecified: Secondary | ICD-10-CM | POA: Insufficient documentation

## 2020-09-24 DIAGNOSIS — I6529 Occlusion and stenosis of unspecified carotid artery: Secondary | ICD-10-CM | POA: Insufficient documentation

## 2020-09-24 DIAGNOSIS — Z7901 Long term (current) use of anticoagulants: Secondary | ICD-10-CM | POA: Diagnosis not present

## 2020-09-24 DIAGNOSIS — Z7984 Long term (current) use of oral hypoglycemic drugs: Secondary | ICD-10-CM | POA: Diagnosis not present

## 2020-09-24 DIAGNOSIS — Z79899 Other long term (current) drug therapy: Secondary | ICD-10-CM | POA: Diagnosis not present

## 2020-09-24 DIAGNOSIS — Z09 Encounter for follow-up examination after completed treatment for conditions other than malignant neoplasm: Secondary | ICD-10-CM | POA: Insufficient documentation

## 2020-09-24 DIAGNOSIS — Z8673 Personal history of transient ischemic attack (TIA), and cerebral infarction without residual deficits: Secondary | ICD-10-CM | POA: Insufficient documentation

## 2020-09-24 DIAGNOSIS — I5022 Chronic systolic (congestive) heart failure: Secondary | ICD-10-CM | POA: Insufficient documentation

## 2020-09-24 DIAGNOSIS — I428 Other cardiomyopathies: Secondary | ICD-10-CM | POA: Diagnosis not present

## 2020-09-24 MED ORDER — SACUBITRIL-VALSARTAN 97-103 MG PO TABS: 1.0000 | ORAL_TABLET | Freq: Two times a day (BID) | ORAL | 11 refills | Status: DC

## 2020-09-24 MED ORDER — EMPAGLIFLOZIN 10 MG PO TABS: 10.0000 mg | ORAL_TABLET | Freq: Every day | ORAL | 11 refills | Status: DC

## 2020-09-24 NOTE — Telephone Encounter (Signed)
Was successful in securing patient an $1000 grant from [employer] [place]) to provide copayment coverage for [employer].  This will keep the out of pocket expense at $0.     I have spoken with the patient.    The billing information is as follows and has been shared with the pharmacy.   Member ID: [id number] Group ID: [id number] RxBin: [id number] PCN: [id number] Dates of Eligibility: 06/26/20 through 09/23/21  Fund:  [place], PharmD, BCPS, BCCP, CPP Heart Failure Clinic Pharmacist [phone]

## 2020-09-24 NOTE — Patient Instructions (Signed)
It was a pleasure seeing you today!  MEDICATIONS: -We are changing your medications today -Increase Entresto to 97/103 mg (1 tablet) twice daily. You may take 2 tablets of the 49/51 mg strength twice daily until you receive the new strength.  -Call if you have questions about your medications.   NEXT APPOINTMENT: Return to clinic in 1 week with Dr. Aundra Dubin.  In general, to take care of your heart failure: -Limit your fluid intake to 2 Liters (half-gallon) per day.   -Limit your salt intake to ideally 2-3 grams (2000-3000 mg) per day. -Weigh yourself daily and record, and bring that "weight diary" to your next appointment.  (Weight gain of 2-3 pounds in 1 day typically means fluid weight.) -The medications for your heart are to help your heart and help you live longer.   -Please contact us before stopping any of your heart medications.  Call the clinic at [phone] with questions or to reschedule future appointments.

## 2020-10-01 ENCOUNTER — Ambulatory Visit: Payer: Medicare HMO | Admitting: Gastroenterology

## 2020-10-01 ENCOUNTER — Institutional Professional Consult (permissible substitution): Payer: Medicare HMO | Admitting: Internal Medicine

## 2020-10-01 DIAGNOSIS — I493 Ventricular premature depolarization: Secondary | ICD-10-CM

## 2020-10-02 ENCOUNTER — Other Ambulatory Visit (HOSPITAL_COMMUNITY): Payer: Self-pay

## 2020-10-03 ENCOUNTER — Telehealth: Payer: Self-pay | Admitting: Family Medicine

## 2020-10-03 NOTE — Telephone Encounter (Signed)
No answer, unable to leave message for patient to call back and schedule Medicare Annual Wellness Visit (AWV) to be done by telephone.  No hx of AWV eligible as of 12/05/18  Please schedule at anytime with [place].      40 Minutes appointment   Any questions, please call me at [phone]

## 2020-10-04 ENCOUNTER — Encounter (HOSPITAL_COMMUNITY): Payer: Self-pay | Admitting: Cardiology

## 2020-10-04 ENCOUNTER — Other Ambulatory Visit (HOSPITAL_COMMUNITY): Payer: Self-pay

## 2020-10-04 ENCOUNTER — Other Ambulatory Visit: Payer: Self-pay

## 2020-10-04 ENCOUNTER — Ambulatory Visit (HOSPITAL_COMMUNITY)
Admission: RE | Admit: 2020-10-04 | Discharge: 2020-10-04 | Disposition: A | Discharge status: Home / Self Care | Payer: Medicare HMO | Source: Ambulatory Visit | Attending: Cardiology | Admitting: Cardiology

## 2020-10-04 VITALS — BP 120/78 | HR 65 | Wt 190.6 lb

## 2020-10-04 DIAGNOSIS — I6322 Cerebral infarction due to unspecified occlusion or stenosis of basilar arteries: Secondary | ICD-10-CM

## 2020-10-04 DIAGNOSIS — Z8673 Personal history of transient ischemic attack (TIA), and cerebral infarction without residual deficits: Secondary | ICD-10-CM | POA: Diagnosis not present

## 2020-10-04 DIAGNOSIS — I6522 Occlusion and stenosis of left carotid artery: Secondary | ICD-10-CM | POA: Diagnosis not present

## 2020-10-04 DIAGNOSIS — I2510 Atherosclerotic heart disease of native coronary artery without angina pectoris: Secondary | ICD-10-CM | POA: Insufficient documentation

## 2020-10-04 DIAGNOSIS — I429 Cardiomyopathy, unspecified: Secondary | ICD-10-CM | POA: Diagnosis not present

## 2020-10-04 DIAGNOSIS — Z7982 Long term (current) use of aspirin: Secondary | ICD-10-CM | POA: Insufficient documentation

## 2020-10-04 DIAGNOSIS — Z79899 Other long term (current) drug therapy: Secondary | ICD-10-CM | POA: Insufficient documentation

## 2020-10-04 DIAGNOSIS — Z8572 Personal history of non-Hodgkin lymphomas: Secondary | ICD-10-CM | POA: Diagnosis not present

## 2020-10-04 DIAGNOSIS — Z7984 Long term (current) use of oral hypoglycemic drugs: Secondary | ICD-10-CM | POA: Diagnosis not present

## 2020-10-04 DIAGNOSIS — I5022 Chronic systolic (congestive) heart failure: Secondary | ICD-10-CM | POA: Insufficient documentation

## 2020-10-04 DIAGNOSIS — I428 Other cardiomyopathies: Secondary | ICD-10-CM | POA: Insufficient documentation

## 2020-10-04 DIAGNOSIS — I491 Atrial premature depolarization: Secondary | ICD-10-CM | POA: Insufficient documentation

## 2020-10-04 DIAGNOSIS — E785 Hyperlipidemia, unspecified: Secondary | ICD-10-CM | POA: Insufficient documentation

## 2020-10-04 DIAGNOSIS — I6329 Cerebral infarction due to unspecified occlusion or stenosis of other precerebral arteries: Secondary | ICD-10-CM

## 2020-10-04 LAB — BASIC METABOLIC PANEL
Anion gap: 7 (ref 5–15)
BUN: 18 mg/dL (ref 8–23)
CO2: 27 mmol/L (ref 22–32)
Calcium: 9.3 mg/dL (ref 8.9–10.3)
Chloride: 106 mmol/L (ref 98–111)
Creatinine, Ser: 1.25 mg/dL — ABNORMAL HIGH (ref 0.61–1.24)
GFR, Estimated: >60 mL/min (ref >60)
Glucose, Bld: 109 mg/dL — ABNORMAL HIGH (ref 70–99)
Potassium: 4.7 mmol/L (ref 3.5–5.1)
Sodium: 140 mmol/L (ref 135–145)

## 2020-10-04 MED ORDER — CARVEDILOL 12.5 MG PO TABS
12.5000 mg | ORAL_TABLET | Freq: Two times a day (BID) | ORAL | 3 refills | Status: DC
Start: 2020-10-04 — End: 2021-01-09

## 2020-10-04 NOTE — Progress Notes (Signed)
PCP: Shelda Pal, DO Cardiology: Dr. Debara Pickett HF Cardiology: Dr. Aundra Dubin  70 y.o. with history of mantle cell lymphoma, CVA, PACs, and chronic systolic CHF was referred by Dr. Debara Pickett for followup of CHF.  Patient was diagnosed with mantle cell lymphoma in 2017 and started CHOP-R in 6/17.  He is currently on ibrutinib with good control of lymphoma.  In 6/21, he was admitted with CVA.  Imaging showed acute right pontine infarct but also chronic right mid frontal lobe infarct and superior left cerebellar infarct.  Carotid dopplers showed moderate LICA stenosis, but carotid stenosis was not thought to be the etiology of CVAs.  He worse a Zio patch for 2 wks, showing frequent PACs but no AF.   During the 6/21 hospitalization, echo showed EF 35-40%.  Coronary CT angiogram in 8/21 showed mild nonobstructive disease.  Most recent echo in 5/22 showed further deterioration in LV function with EF 25-30%, moderate LV dilation, and normal RV.    Patient has been doing well recently.  He ran 3 miles this morning.  No significant exertional dyspnea or chest pain.  No lightheadedness.  Weight down 1 lb.    Labs (7/22): LDL 55, K 4.1, creatinine 1.17, LDL 55 Labs (8/22): K 4.9, creatinine 1.3  PMH: 1. Non-hodgkin's lymphoma: Mantle cell lymphoma.  He was treated with CHOP-R starting 6/17.  Currently on ibrutinib.  2. Carotid stenosis: Carotid dopplers (99991111) with A999333 LICA stenosis.  - Carotid dopplers (123456): A999333 LICA.  3. CVA (6/21): Acute right pontine infarct but also with chronic right mid frontal lobe infarct and superior left cerebellar infarct.  Carotid stenosis was not thought to be the etiology.   - 2 wk Zio patch in 7/21 showed frequent PACs but no atrial fibrillation.  4. PACs: Frequent.  5. 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  - Echo (6/21): EF 35-40% - Coronary CT angiography (8/21): mild nonobstructive CAD, ascending aorta 4.3 cm - Echo (5/22): EF 25-30%,  moderate LV dilation, mild AI, normal RV.  6. 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  7. Duodenal ulcer with GI bleeding  Social History   Socioeconomic History   Marital status: Married    Spouse name: Not on file   Number of children: Not on file   Years of education: Not on file   Highest education level: Not on file  Occupational History   Occupation: Retired  Tobacco Use   Smoking status: Never   Smokeless tobacco: Never  Vaping Use   Vaping Use: Never used  Substance and Sexual Activity   Alcohol use: Yes    Comment: occasional beer   Drug use: No   Sexual activity: Not on file  Other Topics Concern   Not on file  Social History Narrative   Not on file   Social Determinants of Health   Financial Resource Strain: Not on file  Food Insecurity: Not on file  Transportation Needs: Not on file  Physical Activity: Not on file  Stress: Not on file  Social Connections: Not on file  Intimate Partner Violence: Not on file   Family History  Problem Relation Age of Onset   Hypertension Mother    Hypertension Father    Kidney failure Father    Hypertension Sister    Diabetes Sister    Prostate cancer Brother    Lupus Sister    ROS: All systems reviewed and negative except as per HPI.  Current Outpatient Medications  Medication Sig Dispense  Refill   aspirin 81 MG EC tablet Take 1 tablet (81 mg total) by mouth daily. Swallow whole. 30 tablet 11   atorvastatin (LIPITOR) 80 MG tablet TAKE 1 TABLET BY MOUTH EVERY DAY 90 tablet 1   empagliflozin (JARDIANCE) 10 MG TABS tablet Take 1 tablet (10 mg total) by mouth daily before breakfast. 30 tablet 11   ibrutinib (IMBRUVICA) 420 MG TABS TAKE 1 TABLET BY MOUTH DAILY. 28 tablet 3   sacubitril-valsartan (ENTRESTO) 97-103 MG Take 1 tablet by mouth 2 (two) times daily. 60 tablet 11   spironolactone (ALDACTONE) 25 MG tablet Take 0.5 tablets (12.5 mg total) by mouth daily. 45 tablet 3   carvedilol (COREG) 12.5 MG  tablet Take 1 tablet (12.5 mg total) by mouth 2 (two) times daily with a meal. 180 tablet 3   No current facility-administered medications for this encounter.   BP 120/78   Pulse 65   Wt 86.5 kg (190 lb 9.6 oz)   SpO2 100%   BMI 27.35 kg/m  General: NAD Neck: No JVD, no thyromegaly or thyroid nodule.  Lungs: Clear to auscultation bilaterally with normal respiratory effort. CV: Nondisplaced PMI.  Heart regular S1/S2, no S3/S4, no murmur.  No peripheral edema.  No carotid bruit.  Normal pedal pulses.  Abdomen: Soft, nontender, no hepatosplenomegaly, no distention.  Skin: Intact without lesions or rashes.  Neurologic: Alert and oriented x 3.  Psych: Normal affect. Extremities: No clubbing or cyanosis.  HEENT: Normal.   Assessment/Plan: 1. Chronic systolic CHF: Nonischemic cardiomyopathy.  Coronary CTA in 8/21 with mild nonobstructive CAD.  Most recent echo in 5/22 with EF 25-30%, moderate LV dilation, normal RV.  I suspect the most likely cause of his cardiomyopathy is prior doxorubicin chemotherapy with initial treatment of his mantle cell lymphoma.  However, there have also been multiple family members with CHF, apparently of uncertain etiology so cannot rule out a role for genetic cardiomyopathy.  Less likely viral myocarditis but possible.  On exam, he is not volume overloaded.  NYHA class I symptoms.   - I will arrange for cardiac MRI to assess for infiltrative disease/prior myocarditis => this is scheduled for 9/13.  - I will arrange for [facility] gene testing given family history of CMP.  He has 3 children.   - Increase Coreg to 12.5 mg bid.  - Continue spironolactone 12.5 mg daily. BMET today.  - Continue empagliflozin 10 mg daily.  - Continue Entresto 97/103 bid.  - Repeat echo after he is on optimal GDMT, consider ICD if EF < 35%.  Narrow QRS, not CRT candidate.  2. CVA: Patient had acute CVA in 6/21, imaging also showed evidence for prior CVAs as well.  Cause uncertain.  He had  moderate LICA stenosis but this was thought to be a bystander by neurology.  He wore a Zio patch monitor for only 2 weeks, this showed frequent PACs but no AF.  Given evidence for multiple CVAs, frequent PACs, ibrutinib use, and cardiomyopathy, I think that he is at fairly high risk for atrial fibrillation.  - I think that he should have a LINQ monitor placed.  I do not want to miss atrial fibrillation as the cause of his CVAs. He still needs an appointment with EP to do this, will schedule today.  3. Carotid stenosis: Moderate LICA stenosis, repeat dopplers in 8/23.  4. Hyperlipidemia: Goal LDL < 70.  - Continue atorvastatin, good lipids in 7/22.  5. PACs: Ibutrinib does tend to increase risk for  atrial and ventricular arrhythmias.   Followup in 3 wks with HF pharmacist for med titration, see me in 3 months.   Loralie Champagne 10/04/2020

## 2020-10-04 NOTE — Patient Instructions (Addendum)
Labs done today. We will contact you only if your labs are abnormal.  INCREASE Carvedilol to 12.'5mg'$  (1 tablet) by mouth 2 times daily.   No other medication changes were made. Please continue all current medications as prescribed.  You have been referred to Electrophysiology. They will contact you to schedule an appointment.    Genetic test has been done, this has to be sent to [place] to be processed and can take 1-2 weeks to get results back.  We will let you know the results.   Your physician recommends that you schedule a follow-up appointment in: 3 weeks with our Clinic Pharmacist and in 3 months with Dr. Aundra Dubin.  If you have any questions or concerns before your next appointment please send [place] a message through [place] or call our office at [phone].    TO LEAVE A MESSAGE FOR THE NURSE SELECT OPTION 2, PLEASE LEAVE A MESSAGE INCLUDING: YOUR NAME DATE OF BIRTH CALL BACK NUMBER REASON FOR CALL**this is important as we prioritize the call backs  YOU WILL RECEIVE A CALL BACK THE SAME DAY AS LONG AS YOU CALL BEFORE 4:00 PM   Do the following things EVERYDAY: Weigh yourself in the morning before breakfast. Write it down and keep it in a log. Take your medicines as prescribed Eat low salt foods--Limit salt (sodium) to 2000 mg per day.  Stay as active as you can everyday Limit all fluids for the day to less than 2 liters   At the [facility], you and your health needs are our priority. As part of our continuing mission to provide you with exceptional heart care, we have created designated Provider Care Teams. These Care Teams include your primary Cardiologist (physician) and Advanced Practice Providers (APPs- Physician Assistants and Nurse Practitioners) who all work together to provide you with the care you need, when you need it.   You may see any of the following providers on your designated Care Team at your next follow up: Dr Glori Bickers Dr Haynes Kerns, NP Lyda Jester, [place] Audry Riles, PharmD   Please be sure to bring in all your medications bottles to every appointment.

## 2020-10-11 ENCOUNTER — Telehealth (HOSPITAL_COMMUNITY): Payer: Self-pay | Admitting: Emergency Medicine

## 2020-10-11 NOTE — Telephone Encounter (Signed)
Reaching out to patient to offer assistance regarding upcoming cardiac imaging study; pt verbalizes understanding of appt date/time, parking situation and where to check in, and verified current allergies; name and call back number provided for further questions should they arise Marchia Bond RN Navigator Cardiac [facility] [place] [facility] [phone] office [phone] cell   Severely claustro- will reach out to ordering MD for med Denies implants other than port-a-cath

## 2020-10-11 NOTE — Telephone Encounter (Signed)
Have him get valium 2.5 mg one dose 30 min prior

## 2020-10-15 ENCOUNTER — Other Ambulatory Visit (HOSPITAL_COMMUNITY): Payer: Self-pay | Admitting: *Deleted

## 2020-10-15 MED ORDER — DIAZEPAM 5 MG PO TABS: ORAL_TABLET | ORAL | 0 refills | Status: DC

## 2020-10-15 NOTE — Telephone Encounter (Signed)
Pts wife calling to inquire about one time dose of valium for CMR claustrophobia. She went to pharmacy today and theres no prescription available for her to pick up. Pts appt is tomorrow 10/16/20 at [facility] [phone] Office  [phone] Cell

## 2020-10-16 ENCOUNTER — Ambulatory Visit (HOSPITAL_COMMUNITY)
Admission: RE | Admit: 2020-10-16 | Discharge: 2020-10-16 | Disposition: A | Discharge status: Home / Self Care | Payer: Medicare HMO | Source: Ambulatory Visit | Attending: Cardiology | Admitting: Cardiology

## 2020-10-16 ENCOUNTER — Other Ambulatory Visit: Payer: Self-pay

## 2020-10-16 DIAGNOSIS — I429 Cardiomyopathy, unspecified: Secondary | ICD-10-CM | POA: Insufficient documentation

## 2020-10-16 IMAGING — MR MR CARD MORPHOLOGY WO/W CM
45 of 48 series · 45 of 48 positions shown · IV contrast (gadavist)
Comparison: none

EXAM:
CARDIAC MRI
TECHNIQUE: The patient was scanned on a 1.5 Tesla GE magnet. A dedicated
cardiac coil was used. Functional imaging was done using [employer]iest[employer]
sequences. [DATE], and 4 chamber views were done to assess for RWMA's.
Modified WIEDERHOLD rule using a short axis stack was used to
calculate an ejection fraction on a dedicated work station using
Circle software. The patient received 9 cc of Gadavist. After 10
minutes inversion recovery sequences were used to assess for
infiltration and scar tissue.

CONTRAST:  Gadavist 9 cc

[Series 4: t2_haste_db_tra_bh · axial · 8.0mm · 1.41mm/px · 1 of 20 slices shown]
[im 1/20]
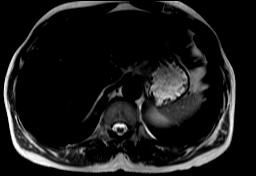

[Series 9: bSSFP · oblique · 8.0mm · 1.61mm/px · 1 of 25 slices shown (1 of 19)]
[im 1/25]
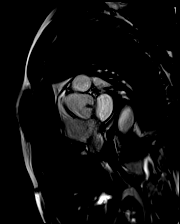

[Series 10: bSSFP · oblique · 8.0mm · 1.61mm/px · 1 of 25 slices shown (2 of 19)]
[im 1/25]
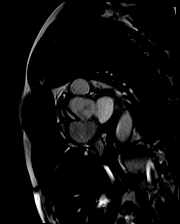

[Series 11: bSSFP · oblique · 8.0mm · 1.61mm/px · 1 of 25 slices shown (3 of 19)]
[im 1/25]
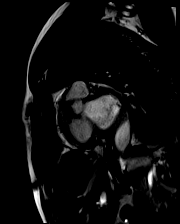

[Series 12: bSSFP · oblique · 8.0mm · 1.61mm/px · 1 of 25 slices shown (4 of 19)]
[im 1/25]
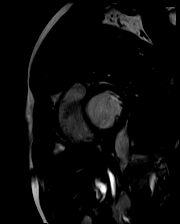

[Series 13: bSSFP · oblique · 8.0mm · 1.61mm/px · 1 of 25 slices shown (5 of 19)]
[im 1/25]
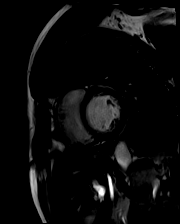

[Series 14: bSSFP · oblique · 8.0mm · 1.61mm/px · 1 of 25 slices shown (6 of 19)]
[im 1/25]
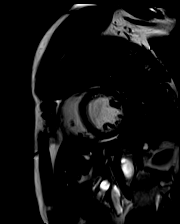

[Series 15: bSSFP · oblique · 8.0mm · 1.61mm/px · 1 of 25 slices shown (7 of 19)]
[im 1/25]
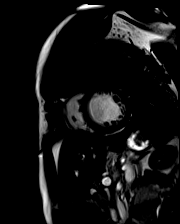

[Series 16: bSSFP · oblique · 8.0mm · 1.61mm/px · 1 of 25 slices shown (8 of 19)]
[im 1/25]
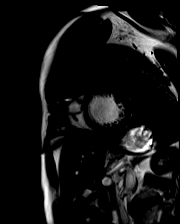

[Series 17: bSSFP · oblique · 8.0mm · 1.61mm/px · 1 of 25 slices shown (9 of 19)]
[im 1/25]
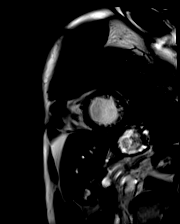

[Series 18: bSSFP · oblique · 8.0mm · 1.61mm/px · 1 of 25 slices shown (10 of 19)]
[im 1/25]
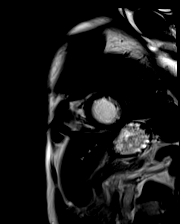

[Series 19: bSSFP · oblique · 8.0mm · 1.61mm/px · 1 of 25 slices shown (11 of 19)]
[im 1/25]
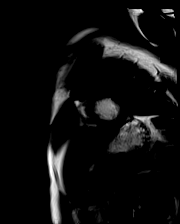

[Series 20: bSSFP · oblique · 8.0mm · 1.61mm/px · 1 of 25 slices shown (12 of 19)]
[im 1/25]
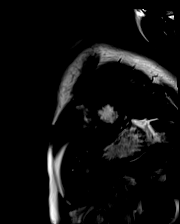

[Series 21: bSSFP · oblique · 8.0mm · 1.61mm/px · 1 of 25 slices shown (13 of 19)]
[im 1/25]
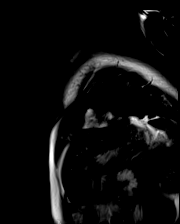

[Series 22: bSSFP · oblique · 8.0mm · 1.61mm/px · 1 of 25 slices shown (14 of 19)]
[im 1/25]
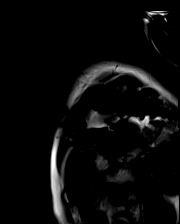

[Series 23: bSSFP · oblique · 8.0mm · 1.61mm/px · 1 of 25 slices shown (15 of 19)]
[im 1/25]
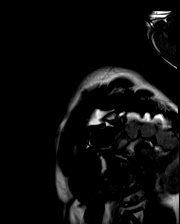

[Series 24: bSSFP · oblique · 6.0mm · 1.41mm/px · 1 of 25 slices shown (16 of 19)]
[im 1/25]
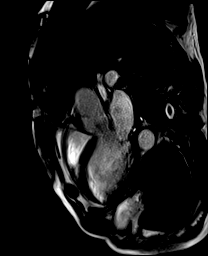

[Series 25: bSSFP · coronal · 6.0mm · 1.41mm/px · 1 of 25 slices shown (17 of 19)]
[im 1/25]
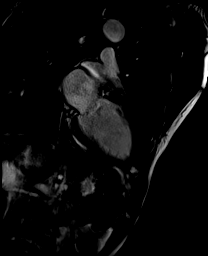

[Series 26: bSSFP · axial · 6.0mm · 1.41mm/px · 1 of 25 slices shown (18 of 19)]
[im 1/25]
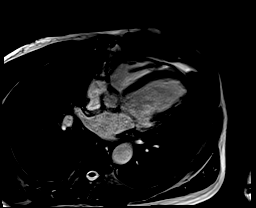

[Series 27: bSSFP · oblique · 6.0mm · 1.41mm/px · 1 of 25 slices shown (19 of 19)]
[im 1/25]
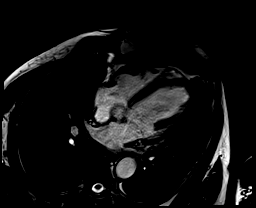

[Series 28: cine_trufi_cs_rt_short axis · oblique · 8.0mm · 1.73mm/px · 1 of 49 slices shown (1 of 17)]
[im 1/49]
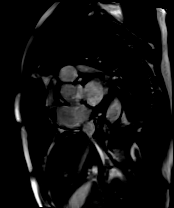

[Series 28: cine_trufi_cs_rt_short axis · oblique · 8.0mm · 1.73mm/px · 1 of 49 slices shown (2 of 17)]
[im 1/49]
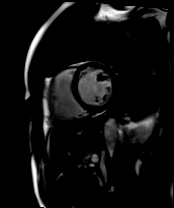

[Series 28: cine_trufi_cs_rt_short axis · oblique · 8.0mm · 1.73mm/px · 1 of 49 slices shown (3 of 17)]
[im 1/49]
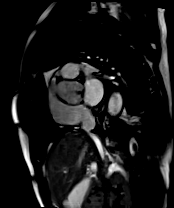

[Series 28: cine_trufi_cs_rt_short axis · oblique · 8.0mm · 1.73mm/px · 1 of 49 slices shown (4 of 17)]
[im 1/49]
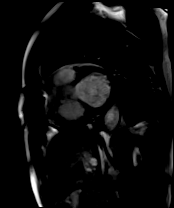

[Series 28: cine_trufi_cs_rt_short axis · oblique · 8.0mm · 1.73mm/px · 1 of 49 slices shown (5 of 17)]
[im 1/49]
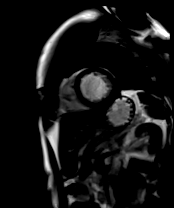

[Series 28: cine_trufi_cs_rt_short axis · oblique · 8.0mm · 1.73mm/px · 1 of 49 slices shown (6 of 17)]
[im 1/49]
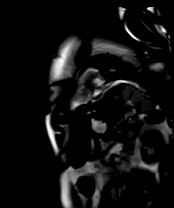

[Series 28: cine_trufi_cs_rt_short axis · oblique · 8.0mm · 1.73mm/px · 1 of 49 slices shown (7 of 17)]
[im 1/49]
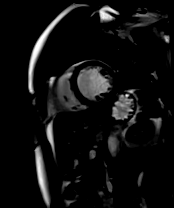

[Series 28: cine_trufi_cs_rt_short axis · oblique · 8.0mm · 1.73mm/px · 1 of 49 slices shown (8 of 17)]
[im 1/49]
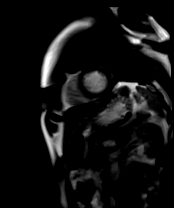

[Series 28: cine_trufi_cs_rt_short axis · oblique · 8.0mm · 1.73mm/px · 1 of 49 slices shown (9 of 17)]
[im 1/49]
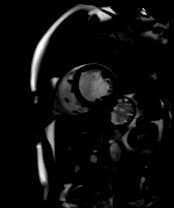

[Series 28: cine_trufi_cs_rt_short axis · oblique · 8.0mm · 1.73mm/px · 1 of 49 slices shown (10 of 17)]
[im 1/49]
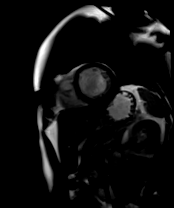

[Series 28: cine_trufi_cs_rt_short axis · oblique · 8.0mm · 1.73mm/px · 1 of 49 slices shown (11 of 17)]
[im 1/49]
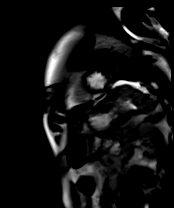

[Series 28: cine_trufi_cs_rt_short axis · oblique · 8.0mm · 1.73mm/px · 1 of 49 slices shown (12 of 17)]
[im 1/49]
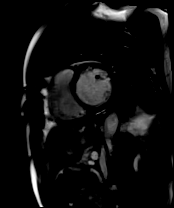

[Series 28: cine_trufi_cs_rt_short axis · oblique · 8.0mm · 1.73mm/px · 1 of 49 slices shown (13 of 17)]
[im 1/49]
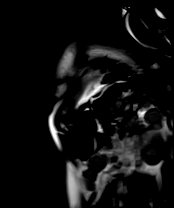

[Series 28: cine_trufi_cs_rt_short axis · oblique · 8.0mm · 1.73mm/px · 1 of 49 slices shown (14 of 17)]
[im 1/49]
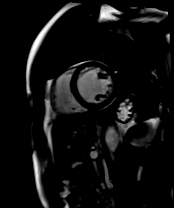

[Series 29: (id)_long_t1 · oblique · 8.0mm · 1.56mm/px · 1 of 24 slices shown]
[im 1/24]
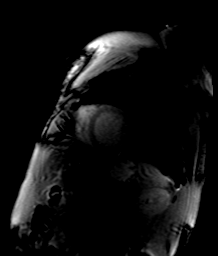

[Series 30: (id)_long_t1_moco · oblique · 8.0mm · 1.56mm/px · 1 of 24 slices shown]
[im 1/24]
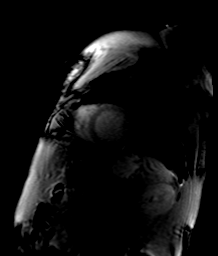

[Series 31: (id)_long_t1_moco_t1 · oblique · 8.0mm · 1.56mm/px · 1 of 6 slices shown]
[im 1/6]
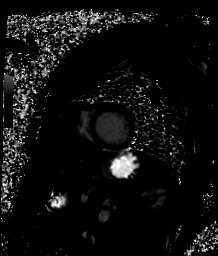

[Series 33: (id)_trufi · oblique · 8.0mm · 2.08mm/px · 1 of 9 slices shown]
[im 1/9]
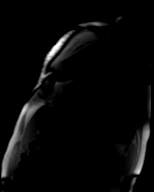

[Series 34: (id)_trufi_moco · oblique · 8.0mm · 2.08mm/px · 1 of 9 slices shown]
[im 1/9]
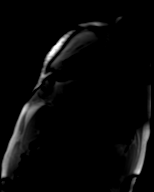

[Series 35: (id)_trufi_moco_t2 · oblique · 8.0mm · 2.08mm/px · 1 of 3 slices shown]
[im 1/3]
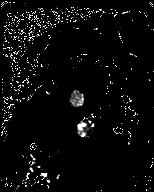

[Series 37: cine_trufi_cs_rt_short axis · oblique · 6.0mm · 1.73mm/px · 1 of 19 slices shown (15 of 17)]
[im 1/19]
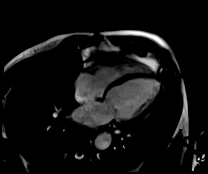

[Series 38: cine_trufi_cs_rt_short axis · coronal · 6.0mm · 1.73mm/px · 1 of 48 slices shown (16 of 17)]
[im 1/48]
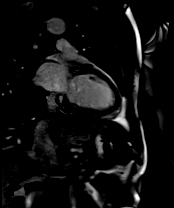

[Series 39: cine_trufi_cs_rt_short axis · oblique · 6.0mm · 1.73mm/px · 1 of 48 slices shown (17 of 17)]
[im 1/48]
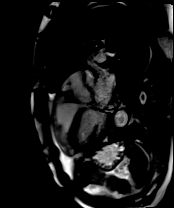

[Series 40: STIR · oblique · 8.0mm · 1.97mm/px · 1 of 14 slices shown]
[im 1/14]
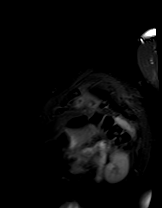

[Series 41: pre short axis · oblique · non-contrast · 8.0mm · 2.25mm/px · 1 of 10 slices shown]
[im 1/10]
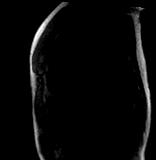

[45 of 48 positions shown; findings below may reference images not displayed]

FINDINGS: Limited images of the lung fields show no gross abnormalities.

Mildly dilated left ventricle with normal wall thickness. EF 43%
with diffuse hypokinesis. Normal right ventricular size and systolic
function, EF 48%. The left atrium was mildly dilated. Normal right
atrium. Abnormal aortic valve, unable to comment on cusp number or
degree of aortic stenosis. Mild aortic insufficiency. Mild mitral
regurgitation.

On delayed enhancement imaging, there were small areas of mid-wall
late gadolinium enhancement (LGE) in the basal inferior wall and the
basal inferoseptal wall.

MEASUREMENTS:
MEASUREMENTS
LVEDV 229 mL
LVSV 98 mL
LVEF 43%

RVEDV 163 mL
RVSV 78 mL
RVEF 48%

ECV 34%
IMPRESSION: 1.  Mildly dilated LV with EF 43%, diffuse hypokinesis.

2.  Normal RV size with EF 48%.

3. On delayed enhancement imaging, there were small areas of
mid-wall LGE in the basal inferior and basal inferoseptal walls.
This is not a pattern that is suggestive of prior MI or amyloidosis.
Possible prior myocarditis, consider sarcoidosis.

4. ECV percentage mildly elevated, not in a range concerning for
amyloidosis.

WIEDERHOLD

## 2020-10-16 MED ORDER — LORAZEPAM 2 MG/ML IJ SOLN
0.5000 mg | Freq: Once | INTRAMUSCULAR | Status: DC | PRN
  Filled 2020-10-16: qty 0.25

## 2020-10-16 MED ORDER — GADOBUTROL 1 MMOL/ML IV SOLN
9.0000 mL | Freq: Once | INTRAVENOUS | Status: AC | PRN
  Administered 2020-10-16: 9 mL via INTRAVENOUS

## 2020-10-16 MED ORDER — LORAZEPAM 2 MG/ML IJ SOLN
INTRAMUSCULAR | Status: AC
  Administered 2020-10-16: 0.5 mg via INTRAVENOUS
  Filled 2020-10-16: qty 1

## 2020-10-29 ENCOUNTER — Ambulatory Visit (HOSPITAL_COMMUNITY)
Admission: RE | Admit: 2020-10-29 | Discharge: 2020-10-29 | Disposition: A | Discharge status: Home / Self Care | Payer: Medicare HMO | Source: Ambulatory Visit | Attending: Cardiology | Admitting: Cardiology

## 2020-10-29 ENCOUNTER — Other Ambulatory Visit: Payer: Self-pay

## 2020-10-29 VITALS — BP 108/70 | HR 59 | Wt 189.6 lb

## 2020-10-29 DIAGNOSIS — Z5181 Encounter for therapeutic drug level monitoring: Secondary | ICD-10-CM | POA: Insufficient documentation

## 2020-10-29 DIAGNOSIS — E785 Hyperlipidemia, unspecified: Secondary | ICD-10-CM | POA: Diagnosis not present

## 2020-10-29 DIAGNOSIS — Z79899 Other long term (current) drug therapy: Secondary | ICD-10-CM | POA: Insufficient documentation

## 2020-10-29 DIAGNOSIS — Z8673 Personal history of transient ischemic attack (TIA), and cerebral infarction without residual deficits: Secondary | ICD-10-CM | POA: Diagnosis not present

## 2020-10-29 DIAGNOSIS — Z7984 Long term (current) use of oral hypoglycemic drugs: Secondary | ICD-10-CM | POA: Insufficient documentation

## 2020-10-29 DIAGNOSIS — C8310 Mantle cell lymphoma, unspecified site: Secondary | ICD-10-CM | POA: Diagnosis not present

## 2020-10-29 DIAGNOSIS — I6522 Occlusion and stenosis of left carotid artery: Secondary | ICD-10-CM | POA: Insufficient documentation

## 2020-10-29 DIAGNOSIS — I5022 Chronic systolic (congestive) heart failure: Secondary | ICD-10-CM | POA: Insufficient documentation

## 2020-10-29 MED ORDER — SPIRONOLACTONE 25 MG PO TABS: 25.0000 mg | ORAL_TABLET | Freq: Every day | ORAL | 3 refills | Status: DC

## 2020-10-29 NOTE — Progress Notes (Addendum)
PCP: Shelda Pal, DO Cardiology: Dr. Debara Pickett HF Cardiology: Dr. Aundra Dubin  HPI:  70 y.o. with history of mantle cell lymphoma, CVA, PACs, and chronic systolic CHF was referred by Dr. Debara Pickett for followup of CHF. Patient was diagnosed with mantle cell lymphoma in 2017 and started CHOP-R in 07/2015. He is currently on ibrutinib with good control of lymphoma. In 07/2019, he was admitted with CVA.  Imaging showed acute right pontine infarct but also chronic right mid frontal lobe infarct and superior left cerebellar infarct. Carotid dopplers showed moderate LICA stenosis, but carotid stenosis was not thought to be the etiology of CVAs. He wore a Zio patch for 2 weeks, showing frequent PACs but no AF. During the 07/2019 hospitalization, echo showed EF 35-40%.  Coronary CT angiogram in 09/2019 showed mild nonobstructive disease. Most recent echo in 06/2020 showed further deterioration in LV function with EF 25-30%, moderate LV dilation, and normal RV.     He returned to HF Clinic on 10/04/20 for follow-up with Dr. Aundra Dubin. Patient doing well recently. He continued to run 3 miles every day. Denied significant exertional dyspnea or chest pain. No lightheadedness. Weight was down 1 lb.   Cardiac MRI 10/16/20, mildly dilated LV with EF 43%, diffuse hypokinesis; normal RV size with EF 48%. Small areas of mid-wall LGE in the basal inferior and basal inferoseptal walls.    Today he returns to HF clinic for pharmacist medication titration. At last visit with MD, carvedilol was increased to 12.5 mg BID. Overall, feeling great. Occassionally experiences slight dizziness when he stands up quickly, but this is not new and not bothersome. Denies fatigue, chest pain, and palpitations. Breathing is good. Runs 3 miles every day and lifts weights. Denies LEE, PND, and orthopnea. Recommended a low K diet. Patient is taking and tolerating all his medications.  HF Medications: Carvedilol 12.5 mg BID Entresto 97/103 mg  BID Spironolactone 12.5 mg daily Jardiance 10 mg daily  Has the patient been experiencing any side effects to the medications prescribed? No, patient is tolerating all medications.  Does the patient have any problems obtaining medications due to transportation or finances? Has [employer]. Has [employer] grant to help with Vania Rea and Entresto.  Understanding of regimen: excellent Understanding of indications: excellent Potential of compliance: excellent Patient understands to avoid NSAIDs. Patient understands to avoid decongestants.   Pertinent Lab Values: 10/04/20: Serum creatinine 1.25, BUN 18, Potassium 4.7, Sodium 140  Vital Signs: Weight: 189.6 lbs (last clinic weight: 190.6 lbs) Blood pressure: 108/70  Heart rate: 59   Assessment/Plan: 1. Chronic systolic CHF: Nonischemic cardiomyopathy. Coronary CTA in 09/2019 with mild nonobstructive CAD. Most recent echo in 06/2020 with EF 25-30%, moderate LV dilation, normal RV. Suspect the most likely cause of his cardiomyopathy is prior doxorubicin chemotherapy with initial treatment of his mantle cell lymphoma. However, there have also been multiple family members with CHF, apparently of uncertain etiology so cannot rule out a role for genetic cardiomyopathy. Less likely viral myocarditis but possible.  - Cardiac MRI done on 10/16/20. Mildly dilated LV with EF 43%, diffuse hypokinesis; normal RV size with EF 48%. Small areas of mid-wall LGE in the basal inferior and basal inferoseptal walls.  - Previously offered for [employer] gene testing given family history of CMP. He has 3 children. - On exam, he is not volume overloaded. NYHA class I symptoms.     - Continue carvedilol 12.5 mg BID - Continue Entresto 97/103 mg BID - Increase spironolactone to 25 mg daily.  BMET in 1 week. We discussed starting a low K diet today as K has been on higher end of normal range recently. Patient noted he currently eats ~6 bananas every week, he will limit  intake of bananas and other high potassium containing foods.  - Continue Jardiance 10 mg daily.  - Repeat echo after he is on optimal GDMT, consider ICD if EF < 35%. Narrow QRS, not CRT candidate.   2. CVA: Patient had acute CVA in 07/2019, imaging also showed evidence for prior CVAs as well. Cause uncertain. He had moderate LICA stenosis but this was thought to be a bystander by neurology. He wore a Zio patch monitor for only 2 weeks, this showed frequent PACs but no AF. Given evidence for multiple CVAs, frequent PACs, ibrutinib use, and cardiomyopathy, potentially fairly high risk for atrial fibrillation.  - LINQ monitor placement scheduled for 11/19/20. Did not want to miss atrial fibrillation as the cause of his CVAs.  3. Carotid stenosis: Moderate LICA stenosis, repeat dopplers in 09/2021.   4. Hyperlipidemia: Goal LDL < 70.  - Continue atorvastatin, good lipids in 08/2020.   5. PACs: Ibutrinib does tend to increase risk for atrial and ventricular arrhythmias.   Follow-up on 11/05/20 for BMET and 01/09/21 with Dr. Rush Farmer, PharmD, BCPS, [facility], [facility] Pharmacist [phone]

## 2020-10-29 NOTE — Patient Instructions (Signed)
It was a pleasure seeing you today!  MEDICATIONS: -We are changing your medications today -Increase spironolactone to 25 mg (1 tablet) daily -Call if you have questions about your medications.   NEXT APPOINTMENT: Return to clinic in 2 months with Dr. McLean.  In general, to take care of your heart failure: -Limit your fluid intake to 2 Liters (half-gallon) per day.   -Limit your salt intake to ideally 2-3 grams (2000-3000 mg) per day. -Weigh yourself daily and record, and bring that "weight diary" to your next appointment.  (Weight gain of 2-3 pounds in 1 day typically means fluid weight.) -The medications for your heart are to help your heart and help you live longer.   -Please contact us before stopping any of your heart medications.  Call the clinic at [phone] with questions or to reschedule future appointments.  

## 2020-10-29 NOTE — Addendum Note (Signed)
Encounter addended by: Orma Render, RPH-CPP on: 10/29/2020 2:06 PM  Actions taken: Order list changed, Diagnosis association updated

## 2020-10-30 ENCOUNTER — Other Ambulatory Visit (HOSPITAL_COMMUNITY): Payer: Self-pay

## 2020-10-30 ENCOUNTER — Other Ambulatory Visit: Payer: Self-pay | Admitting: Adult Health

## 2020-10-30 DIAGNOSIS — C8319 Mantle cell lymphoma, extranodal and solid organ sites: Secondary | ICD-10-CM

## 2020-10-31 ENCOUNTER — Encounter: Payer: Self-pay | Admitting: Oncology

## 2020-10-31 ENCOUNTER — Other Ambulatory Visit (HOSPITAL_COMMUNITY): Payer: Self-pay

## 2020-10-31 MED ORDER — IMBRUVICA 420 MG PO TABS
1.0000 | ORAL_TABLET | Freq: Every day | ORAL | 3 refills | Status: DC
  Filled 2020-10-31: qty 28, 28d supply, fill #0

## 2020-11-02 ENCOUNTER — Telehealth: Payer: Self-pay

## 2020-11-02 ENCOUNTER — Other Ambulatory Visit (HOSPITAL_COMMUNITY): Payer: Self-pay

## 2020-11-02 ENCOUNTER — Encounter: Payer: Self-pay | Admitting: Oncology

## 2020-11-02 NOTE — Telephone Encounter (Signed)
Oral Oncology Patient Advocate Encounter  Was successful in securing patient a $8500 grant from [employer] to provide copayment coverage for Imbruvica.  This will keep the out of pocket expense at $0.     [employer] ID: [id number]  I have spoken with the patient.   The billing information is as follows and has been shared with [place]: [id number] PCN: [id number] Member ID: [id number] Group ID: [id number]  Dates of Eligibility: 10/03/20 through 10/02/21  Fund:  [place] Patient [name] Phone [phone] Fax [phone] 11/02/2020 8:31 AM

## 2020-11-05 ENCOUNTER — Other Ambulatory Visit: Payer: Self-pay

## 2020-11-05 ENCOUNTER — Ambulatory Visit (HOSPITAL_COMMUNITY)
Admission: RE | Admit: 2020-11-05 | Discharge: 2020-11-05 | Disposition: A | Discharge status: Home / Self Care | Payer: Medicare HMO | Source: Ambulatory Visit | Attending: Internal Medicine | Admitting: Internal Medicine

## 2020-11-05 DIAGNOSIS — I5022 Chronic systolic (congestive) heart failure: Secondary | ICD-10-CM | POA: Diagnosis not present

## 2020-11-05 LAB — BASIC METABOLIC PANEL
Anion gap: 8 (ref 5–15)
BUN: 18 mg/dL (ref 8–23)
CO2: 24 mmol/L (ref 22–32)
Calcium: 9 mg/dL (ref 8.9–10.3)
Chloride: 109 mmol/L (ref 98–111)
Creatinine, Ser: 1.43 mg/dL — ABNORMAL HIGH (ref 0.61–1.24)
GFR, Estimated: 54 mL/min — ABNORMAL LOW (ref >60)
Glucose, Bld: 98 mg/dL (ref 70–99)
Potassium: 3.6 mmol/L (ref 3.5–5.1)
Sodium: 141 mmol/L (ref 135–145)

## 2020-11-08 ENCOUNTER — Other Ambulatory Visit: Payer: Self-pay | Admitting: *Deleted

## 2020-11-13 ENCOUNTER — Inpatient Hospital Stay: Payer: Medicare HMO | Attending: Oncology

## 2020-11-13 ENCOUNTER — Ambulatory Visit (HOSPITAL_COMMUNITY)
Admission: RE | Admit: 2020-11-13 | Discharge: 2020-11-13 | Disposition: A | Discharge status: Home / Self Care | Payer: Medicare HMO | Source: Ambulatory Visit | Attending: Oncology | Admitting: Oncology

## 2020-11-13 ENCOUNTER — Other Ambulatory Visit: Payer: Self-pay

## 2020-11-13 DIAGNOSIS — I428 Other cardiomyopathies: Secondary | ICD-10-CM | POA: Insufficient documentation

## 2020-11-13 DIAGNOSIS — Z841 Family history of disorders of kidney and ureter: Secondary | ICD-10-CM | POA: Insufficient documentation

## 2020-11-13 DIAGNOSIS — Z8249 Family history of ischemic heart disease and other diseases of the circulatory system: Secondary | ICD-10-CM | POA: Diagnosis not present

## 2020-11-13 DIAGNOSIS — Z833 Family history of diabetes mellitus: Secondary | ICD-10-CM | POA: Insufficient documentation

## 2020-11-13 DIAGNOSIS — C8590 Non-Hodgkin lymphoma, unspecified, unspecified site: Secondary | ICD-10-CM | POA: Diagnosis not present

## 2020-11-13 DIAGNOSIS — C8319 Mantle cell lymphoma, extranodal and solid organ sites: Secondary | ICD-10-CM | POA: Diagnosis not present

## 2020-11-13 DIAGNOSIS — Z79899 Other long term (current) drug therapy: Secondary | ICD-10-CM | POA: Diagnosis not present

## 2020-11-13 DIAGNOSIS — Z832 Family history of diseases of the blood and blood-forming organs and certain disorders involving the immune mechanism: Secondary | ICD-10-CM | POA: Diagnosis not present

## 2020-11-13 DIAGNOSIS — Z8042 Family history of malignant neoplasm of prostate: Secondary | ICD-10-CM | POA: Diagnosis not present

## 2020-11-13 DIAGNOSIS — E785 Hyperlipidemia, unspecified: Secondary | ICD-10-CM | POA: Insufficient documentation

## 2020-11-13 DIAGNOSIS — I6329 Cerebral infarction due to unspecified occlusion or stenosis of other precerebral arteries: Secondary | ICD-10-CM | POA: Insufficient documentation

## 2020-11-13 DIAGNOSIS — I700 Atherosclerosis of aorta: Secondary | ICD-10-CM | POA: Diagnosis not present

## 2020-11-13 DIAGNOSIS — R972 Elevated prostate specific antigen [PSA]: Secondary | ICD-10-CM | POA: Insufficient documentation

## 2020-11-13 DIAGNOSIS — K276 Chronic or unspecified peptic ulcer, site unspecified, with both hemorrhage and perforation: Secondary | ICD-10-CM | POA: Insufficient documentation

## 2020-11-13 DIAGNOSIS — C8315 Mantle cell lymphoma, lymph nodes of inguinal region and lower limb: Secondary | ICD-10-CM | POA: Diagnosis not present

## 2020-11-13 DIAGNOSIS — I2510 Atherosclerotic heart disease of native coronary artery without angina pectoris: Secondary | ICD-10-CM | POA: Diagnosis not present

## 2020-11-13 DIAGNOSIS — N433 Hydrocele, unspecified: Secondary | ICD-10-CM | POA: Diagnosis not present

## 2020-11-13 LAB — GLUCOSE, CAPILLARY: Glucose-Capillary: 97 mg/dL (ref 70–99)

## 2020-11-13 LAB — CBC WITH DIFFERENTIAL/PLATELET
Abs Immature Granulocytes: 0.01 10*3/uL (ref 0.00–0.07)
Basophils Absolute: 0 10*3/uL (ref 0.0–0.1)
Basophils Relative: 1 %
Eosinophils Absolute: 0 10*3/uL (ref 0.0–0.5)
Eosinophils Relative: 1 %
HCT: 41.2 % (ref 39.0–52.0)
Hemoglobin: 13.3 g/dL (ref 13.0–17.0)
Immature Granulocytes: 0 %
Lymphocytes Relative: 22 %
Lymphs Abs: 0.9 10*3/uL (ref 0.7–4.0)
MCH: 28.4 pg (ref 26.0–34.0)
MCHC: 32.3 g/dL (ref 30.0–36.0)
MCV: 88 fL (ref 80.0–100.0)
Monocytes Absolute: 0.6 10*3/uL (ref 0.1–1.0)
Monocytes Relative: 13 %
Neutro Abs: 2.7 10*3/uL (ref 1.7–7.7)
Neutrophils Relative %: 63 %
Platelets: 256 10*3/uL (ref 150–400)
RBC: 4.68 MIL/uL (ref 4.22–5.81)
RDW: 13.9 % (ref 11.5–15.5)
WBC: 4.3 10*3/uL (ref 4.0–10.5)
nRBC: 0 % (ref 0.0–0.2)

## 2020-11-13 LAB — COMPREHENSIVE METABOLIC PANEL
ALT: 25 U/L (ref 0–44)
AST: 22 U/L (ref 15–41)
Albumin: 4.2 g/dL (ref 3.5–5.0)
Alkaline Phosphatase: 34 U/L — ABNORMAL LOW (ref 38–126)
Anion gap: 9 (ref 5–15)
BUN: 22 mg/dL (ref 8–23)
CO2: 25 mmol/L (ref 22–32)
Calcium: 9.2 mg/dL (ref 8.9–10.3)
Chloride: 107 mmol/L (ref 98–111)
Creatinine, Ser: 1.27 mg/dL — ABNORMAL HIGH (ref 0.61–1.24)
GFR, Estimated: >60 mL/min (ref >60)
Glucose, Bld: 83 mg/dL (ref 70–99)
Potassium: 4.7 mmol/L (ref 3.5–5.1)
Sodium: 141 mmol/L (ref 135–145)
Total Bilirubin: 0.8 mg/dL (ref 0.3–1.2)
Total Protein: 6.7 g/dL (ref 6.5–8.1)

## 2020-11-13 LAB — LACTATE DEHYDROGENASE: LDH: 238 U/L — ABNORMAL HIGH (ref 98–192)

## 2020-11-13 IMAGING — CT NM PET TUM IMG RESTAG (PS) SKULL BASE T - THIGH
1 of 8 series · 2 of 25 positions shown · non-contrast
Comparison: PET-CT dated [DATE]

CLINICAL DATA: Follow-up treatment strategy for lymphoma.

EXAM:
NUCLEAR MEDICINE PET SKULL BASE TO THIGH
TECHNIQUE: 9.2 mCi F-18 FDG was injected intravenously. Full-ring PET imaging
was performed from the skull base to thigh after the radiotracer. CT
data was obtained and used for attenuation correction and anatomic
localization.
Fasting blood glucose: 97 mg/dl

[Series 4: ct sk_thigh 5.0 bf37 · axial · 5.0mm · 0.98mm/px · z∈[-56,+412]mm · 2 of 234 slices shown]
[im 117/234  brain]
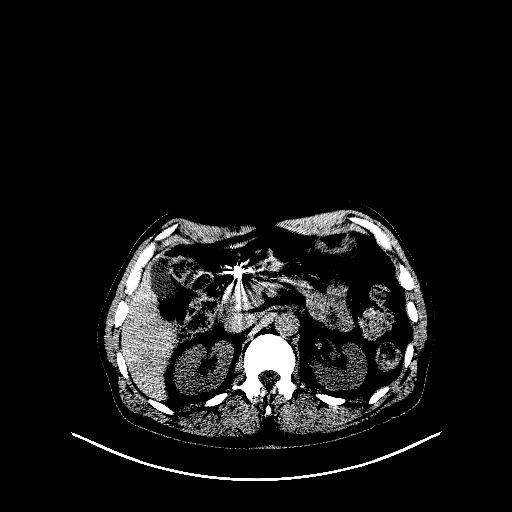
[im 234/234  brain]
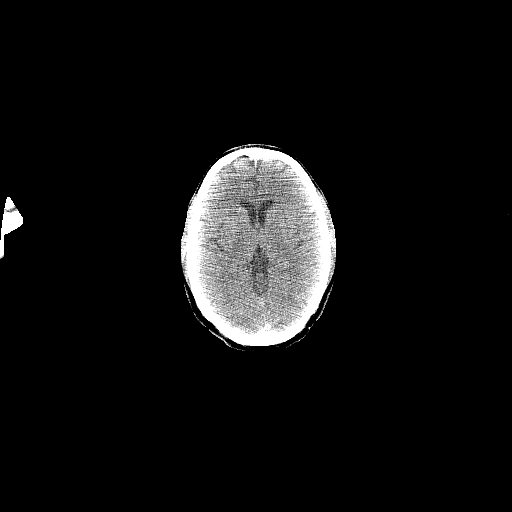

[2 of 25 positions shown; findings below may reference images not displayed]

FINDINGS: Mediastinal blood pool activity: SUV max

Liver activity: SUV max

NECK: No hypermetabolic lymph nodes in the neck.

Incidental CT findings: none

CHEST: Subcentimeter hypermetabolic mediastinal and hilar lymph
nodes are unchanged compared to prior exam. Reference precarinal
lymph node measuring 8 mm on series 4, image 68 with an SUV max of
3.5, previously SUV max was 4.7. Right hilar lymph nodes which are
difficult to accurately measure demonstrate an SUV max of 4.3,
previously 5.7 when measured at a similar location. No new or
enlarging lymphadenopathy seen in the chest. New clustered nodular
opacities of the posterior right upper lobe with mild FDG uptake
seen on series 8, image 21.

Incidental CT findings: Atherosclerotic disease of the thoracic
aorta. Right chest wall port with tip in the right atrium coronary
artery calcifications of the LAD and RCA. No new or enlarging
pulmonary nodules.

ABDOMEN/PELVIS: No abnormal hypermetabolic activity within the
liver, pancreas, adrenal glands, or spleen. No hypermetabolic lymph
nodes in the abdomen or pelvis. Interval resolution of
hypermetabolic prostate activity. Focal FDG uptake of the
superficial right groin with no soft tissue correlate, likely due to
contamination.

Incidental CT findings: Metallic densities which are likely
embolization coils noted in the upper abdomen. Aorta iliac
atherosclerotic disease. Mild prostatomegaly. Small left hydrocele

SKELETON: No focal hypermetabolic activity to suggest skeletal
metastasis.

Incidental CT findings: none
IMPRESSION: Small [HOSPITAL] 4 mediastinal and hilar lymph nodes demonstrate
slightly decreased overall activity when compared with prior exam.
No new or enlarging lymph nodes seen in the chest, abdomen or
pelvis.

Interval resolution of hypermetabolic prostate activity.

Previously described metabolic activity of the left medial buttocks
subcutaneous soft tissues is no longer apparent, favor resolved
inflammatory process.

New clustered nodular opacities of the posterior right upper lobe
with mild FDG uptake, likely due to infection or aspiration.
Recommend attention on follow-up.

## 2020-11-13 MED ORDER — FLUDEOXYGLUCOSE F - 18 (FDG) INJECTION
9.5000 | Freq: Once | INTRAVENOUS | Status: AC
  Administered 2020-11-13: 9.22 via INTRAVENOUS

## 2020-11-14 ENCOUNTER — Encounter: Payer: Self-pay | Admitting: Oncology

## 2020-11-14 LAB — PROSTATE-SPECIFIC AG, SERUM (LABCORP): Prostate Specific Ag, Serum: 3.4 ng/mL (ref 0.0–4.0)

## 2020-11-14 NOTE — Progress Notes (Signed)
[place]  [phone] [phone]    ID: [name] DOB: 1951/01/03  MR#: [id number]  VXY#:[id number]  Patient Care Team: Shelda Pal, DO as PCP - General (Family Medicine) Debara Pickett Nadean Corwin, MD as PCP - Cardiology (Cardiology) Alishba Naples, Virgie Dad, MD as Consulting Physician (Oncology) Donnie Mesa, MD as Consulting Physician (General Surgery) Wilford Corner, MD as Consulting Physician (Gastroenterology) Ples Specter, MD as Referring Physician (Internal Medicine) Larina Earthly, MD as Referring Physician (Hematology and Oncology) Grace Isaac, MD (Inactive) as Consulting Physician (Cardiothoracic Surgery) OTHER MD: Napoleon Form MD; Dr Glennon Mac [phone]   CHIEF COMPLAINT: 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   Since his last visit, he underwent restaging PET on 11/13/2020 showing: slightly decreased overall activity in small mediastinal and hilar lymph nodes; no new or enlarging lymph nodes; interval resolution of hypermetabolic prostate activity; new clustered nodular opacities of posterior right upper lobe with mild FDG uptake, likely due to infection or aspiration.  He continues on ibrutinib.  He generally does not have any bruising or bleeding problems on this medication.  He has not noted any palpitations or any other side effects that he is aware of  Of note, he underwent repeat echocardiogram on 06/29/2020 showing decline in ejection fraction to 25-30%. This prompted referral to Dr. Aundra Dubin on 08/23/2020 and cardiac MRI on 10/16/2020 showing: mildly dilated LV with EF 43%, diffuse hypokinesis; normal RV size with EF 48%; findings not concerning for amyloidosis, instead possible myocarditis or sarcoidosis.  At the most recent cardiac visit on 10/29/2020  his spironolactone was increased and repeat echo is planned.   REVIEW OF SYSTEMS: [name] is running about 3 miles most days.  He mows his own lawn and his children's wants and spends a lot of time with grandchildren which is also a very active time.  He he also does some weightlifting but not with very heavy weights as he did previously.  He has no dysuria or hematuria or significant issues with urinary stream.  He has had no drenching sweats, fever, rash, or pruritus.  He is not aware of any adenopathy.  He denies gastritis symptoms.  A detailed review of systems was otherwise stable.   COVID 19 VACCINATION STATUS: s/p Moderna x2 and booster October 2021; received Evusheld April 2022   HISTORY OF PRESENT ILLNESS: From the original intake note:   "[name]" was in his usual good health until early December 2016, when he had a brief syncopal episode. He "blew this off" and it did not happen again until 01/17/2015 when he felt weak at work and later fainted at home. He was brought to the ED where he was found to be guaiac positive (denies melena before that date) with a Hb of 4.5 MCV 82.2, platelets 295K, WBC 9.1 and normal INR and PTT. He was admitted and underwent EGD under Dr Michail Sermon 01/18/2015 showing a very large duodenal ulcer w clot. Abdominal US 01/19/2015 was not very informative but CT abd/pelvis 12/17,2016 showed adenopathy in the anterior mediastinum, retroperitoneum and pelvis, with a large ulcerated mass involving the distal stomach/proximal duodenum and extending into the gastrohepatic region.   On 01/22/2015 he underwent right inguinal lymph node biopsy, consistent with mantle cell lymphoma (([id number] and [id number])). Specifically the lymph node showed effacement of the architecture by sheets of small to medium  sized lymphocytes with no apparent nodularity. The cells were positive for CD20, CD5, BCL-2, CD21, and cyclin D1. He 67 was low (10-20%). CD21 revealed a few residual germinal  centers area at the B cells were negative for CD10, and BCL-6. CD23 was negative.  On 02/06/2015 the patient underwent a bone marrow aspirate and biopsy. The pathology from this procedure (FZB 17-2 and 17-7) was positive for involvement by non-Hodgkin's lymphoma with numerous atypical interstitial and paratrabecular lymphoid aggregates consistent with mantle cell lymphoma. Iron stain showed scant iron flow cytometry confirmed a monoclonal B-cell population, lambda restricted, consistent with the patient's known diagnosis of mantle cell lymphoma.  On 01/18/2015 the patient underwent EGD which found a massive duodenal bulb ulcer with adherent clot, although no active bleeding.his hemoglobin was in the 8.4 range and stable. He was discharged on 01/22/2015, but within 24 hours was readmitted with syncope and altered mental status. He had aspirated and required intubation for his respiratory failure. Hemoglobin was down to 6.3. An NG tube was placed, the patient was multiply transfused, and Surgery was considered but felt not to be feasible.accordingly the patient underwent arteriography 01/23/2015 which found the superior mesenteric and celiac arteries not to be the involved vessels. The gastroduodenal artery showed multiple branches providing supply to the hypervascular duodenal mass, and this artery was coil embolized.. Also a port was placed at the time of this procedure with a view to eventual chemotherapy.  He was discharged 01/26/2015,but required readmission 02/07/2015 for another presyncopal episode. He presented to the emergency room where he was found to have a hemoglobin of 7.5. The patient was transfusedIIA hemoglobin of 10 and was discharged 02/07/2014. He was scheduled to start CHOP right toxin 02/08/2014.  His subsequent history is as detailed below.   PAST MEDICAL HISTORY: Past Medical History:  Diagnosis Date   Anemia    Cancer ([place])    Lymphoma   Essential hypertension    GERD  (gastroesophageal reflux disease)    GI bleed    HLD (hyperlipidemia)    Weakness     PAST SURGICAL HISTORY: Past Surgical History:  Procedure Laterality Date   ESOPHAGOGASTRODUODENOSCOPY Left 01/18/2015   Procedure: ESOPHAGOGASTRODUODENOSCOPY (EGD);  Surgeon: Wilford Corner, MD;  Location: [facility] ENDOSCOPY;  Service: Endoscopy;  Laterality: Left;   INGUINAL HERNIA REPAIR Right 01/22/2015   Procedure: RIGHT INGUINAL LYMPH NODE BX;  Surgeon: Donnie Mesa, MD;  Location: [place];  Service: General;  Laterality: Right;   MEDIASTINOSCOPY N/A 05/05/2017   Procedure: MEDIASTINOSCOPY;  Surgeon: Grace Isaac, MD;  Location[facility] OR;  Service: Thoracic;  Laterality: N/A;   PORTACATH PLACEMENT  01/26/2015    power port with tip SVC/RA [place]   SKIN SURGERY     Small benign cysts over left scalp removed   VIDEO BRONCHOSCOPY WITH ENDOBRONCHIAL ULTRASOUND N/A 05/05/2017   Procedure: VIDEO BRONCHOSCOPY WITH ENDOBRONCHIAL ULTRASOUND;  Surgeon: Grace Isaac, MD;  Location: [place];  Service: Thoracic;  Laterality: N/A;    FAMILY HISTORY Family History  Problem Relation Age of Onset   Hypertension Mother    Hypertension Father    Kidney failure Father    Hypertension Sister    Diabetes Sister    Prostate cancer Brother    Lupus Sister   The patient's father died from CHF complications age 36. The patient's mother is 81 y/o as of December 2016. The patient has 9 brothers, 8 sisters. One brother has prostate cancer. There is no other cancer history in the family to  his knowledge.   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  He retired in 2020.  His wife of 42+ years, [name], is disabled due to RA but recently went back to work. Daughter, [name] works as a Teacher, early years/pre for [employer] (she previously worked at the [employer] in records). Son, [name] owns a window washing business. Son, [name] is in Press photographer at [employer]. All live in [place]. The patient  has 3 grandchildren. He attends a [employer].                ADVANCED DIRECTIVES: Wife, [name], is automatically his HCPOA   HEALTH MAINTENANCE: Social History   Tobacco Use   Smoking status: Never   Smokeless tobacco: Never  Vaping Use   Vaping Use: Never used  Substance Use Topics   Alcohol use: Yes    Comment: occasional beer   Drug use: No    Colonoscopy:  PSA: 2.27 on 01/22/2015  Hepatitis serologies (B surface antigen, hepatitis B core antibody and hepatitis C IgM) negative 01/18/2015  No Known Allergies  Current Outpatient Medications  Medication Sig Dispense Refill   aspirin 81 MG EC tablet Take 1 tablet (81 mg total) by mouth daily. Swallow whole. 30 tablet 11   atorvastatin (LIPITOR) 80 MG tablet TAKE 1 TABLET BY MOUTH EVERY DAY 90 tablet 1   carvedilol (COREG) 12.5 MG tablet Take 1 tablet (12.5 mg total) by mouth 2 (two) times daily with a meal. 180 tablet 3   empagliflozin (JARDIANCE) 10 MG TABS tablet Take 1 tablet (10 mg total) by mouth daily before breakfast. 30 tablet 11   ibrutinib (IMBRUVICA) 420 MG TABS TAKE 1 TABLET BY MOUTH DAILY. 28 tablet 6   sacubitril-valsartan (ENTRESTO) 97-103 MG Take 1 tablet by mouth 2 (two) times daily. 60 tablet 11   spironolactone (ALDACTONE) 25 MG tablet Take 1 tablet (25 mg total) by mouth daily. 90 tablet 3   No current facility-administered medications for this visit.    OBJECTIVE: African-American man in no acute distress Vitals:   11/15/20 1055  BP: 125/63  Pulse: 63  Resp: 18  Temp: 97.9 F (36.6 C)  SpO2: 99%   Wt Readings from Last 3 Encounters:  11/15/20 188 lb 8 oz (85.5 kg)  10/29/20 189 lb 9.6 oz (86 kg)  10/04/20 190 lb 9.6 oz (86.5 kg)   Body mass index is 27.05 kg/m.    ECOG FS:1 - Symptomatic but completely ambulatory  Sclerae unicteric, EOMs intact Wearing a mask No cervical or supraclavicular adenopathy, no axillary or inguinal adenopathy Lungs no rales or rhonchi Heart regular  rate and rhythm Abd soft, nontender, positive bowel sounds MSK no focal spinal tenderness, no upper extremity lymphedema Neuro: nonfocal, well oriented, appropriate affect   LAB RESULTS:  CMP     Component Value Date/Time   NA 141 11/13/2020 0830   NA 142 10/21/2019 0904   NA 140 01/16/2017 0837   K 4.7 11/13/2020 0830   K 3.9 01/16/2017 0837   CL 107 11/13/2020 0830   CO2 25 11/13/2020 0830   CO2 24 01/16/2017 0837   GLUCOSE 83 11/13/2020 0830   GLUCOSE 102 01/16/2017 0837   BUN 22 11/13/2020 0830   BUN 17 10/21/2019 0904   BUN 20.4 01/16/2017 0837   CREATININE 1.27 (H) 11/13/2020 0830   CREATININE 1.2 01/16/2017 0837   CALCIUM 9.2 11/13/2020 0830   CALCIUM 8.9 01/16/2017  0837   PROT 6.7 11/13/2020 0830   PROT 6.6 10/21/2019 0904   PROT 6.9 01/16/2017 0837   ALBUMIN 4.2 11/13/2020 0830   ALBUMIN 4.4 10/21/2019 0904   ALBUMIN 4.0 01/16/2017 0837   AST 22 11/13/2020 0830   AST 20 01/16/2017 0837   ALT 25 11/13/2020 0830   ALT 22 01/16/2017 0837   ALKPHOS 34 (L) 11/13/2020 0830   ALKPHOS 47 01/16/2017 0837   BILITOT 0.8 11/13/2020 0830   BILITOT 0.7 10/21/2019 0904   BILITOT 0.36 01/16/2017 0837   GFRNONAA >60 11/13/2020 0830   GFRAA >60 10/21/2019 1028    No results found for: TOTALPROTELP, ALBUMINELP, A1GS, A2GS, BETS, BETA2SER, GAMS, MSPIKE, SPEI  Lab Results  Component Value Date   WBC 4.3 11/13/2020   NEUTROABS 2.7 11/13/2020   HGB 13.3 11/13/2020   HCT 41.2 11/13/2020   MCV 88.0 11/13/2020   PLT 256 11/13/2020    No results found for: LABCA2  No components found for: ZDGLOV564  No results for input(s): INR in the last 168 hours.  No results found for: LABCA2  No results found for: PPI951  No results found for: OAC166  No results found for: AYT016  No results found for: CA2729  No components found for: HGQUANT  No results found for: CEA1 / No results found for: CEA1   No results found for: AFPTUMOR  No results found for:  CHROMOGRNA  No results found for: KPAFRELGTCHN, LAMBDASER, KAPLAMBRATIO (kappa/lambda light chains)  No results found for: HGBA, HGBA2QUANT, HGBFQUANT, HGBSQUAN (Hemoglobinopathy evaluation)   Lab Results  Component Value Date   LDH 238 (H) 11/13/2020    Lab Results  Component Value Date   IRON 115 02/16/2015   TIBC 345 02/16/2015   IRONPCTSAT 33 02/16/2015   (Iron and TIBC)  Lab Results  Component Value Date   FERRITIN 85 02/09/2015    Urinalysis    Component Value Date/Time   COLORURINE YELLOW 12/01/2019 1130   APPEARANCEUR CLOUDY (A) 12/01/2019 1130   LABSPEC 1.021 12/01/2019 1130   PHURINE 6.0 12/01/2019 1130   GLUCOSEU NEGATIVE 12/01/2019 1130   HGBUR NEGATIVE 12/01/2019 1130   BILIRUBINUR NEGATIVE 12/01/2019 1130   KETONESUR NEGATIVE 12/01/2019 1130   PROTEINUR 100 (A) 12/01/2019 1130   NITRITE NEGATIVE 12/01/2019 1130   LEUKOCYTESUR LARGE (A) 12/01/2019 1130    STUDIES: NM PET Image Restag (PS) Skull Base To Thigh  Result Date: 11/14/2020 CLINICAL DATA:  Follow-up treatment strategy for lymphoma. EXAM: NUCLEAR MEDICINE PET SKULL BASE TO THIGH TECHNIQUE: 9.2 mCi F-18 FDG was injected intravenously. Full-ring PET imaging was performed from the skull base to thigh after the radiotracer. CT data was obtained and used for attenuation correction and anatomic localization. Fasting blood glucose: 97 mg/dl COMPARISON:  PET-CT dated November 29, 2019 FINDINGS: Mediastinal blood pool activity: SUV max 2.1 Liver activity: SUV max 2.7 NECK: No hypermetabolic lymph nodes in the neck. Incidental CT findings: none CHEST: Subcentimeter hypermetabolic mediastinal and hilar lymph nodes are unchanged compared to prior exam. Reference precarinal lymph node measuring 8 mm on series 4, image 68 with an SUV max of 3.5, previously SUV max was 4.7. Right hilar lymph nodes which are difficult to accurately measure demonstrate an SUV max of 4.3, previously 5.7 when measured at a similar  location. No new or enlarging lymphadenopathy seen in the chest. New clustered nodular opacities of the posterior right upper lobe with mild FDG uptake seen on series 8, image 21. Incidental CT findings: Atherosclerotic disease  of the thoracic aorta. Right chest wall port with tip in the right atrium coronary artery calcifications of the LAD and RCA. No new or enlarging pulmonary nodules. ABDOMEN/PELVIS: No abnormal hypermetabolic activity within the liver, pancreas, adrenal glands, or spleen. No hypermetabolic lymph nodes in the abdomen or pelvis. Interval resolution of hypermetabolic prostate activity. Focal FDG uptake of the superficial right groin with no soft tissue correlate, likely due to contamination. Incidental CT findings: Metallic densities which are likely embolization coils noted in the upper abdomen. Aorta iliac atherosclerotic disease. Mild prostatomegaly. Small left hydrocele SKELETON: No focal hypermetabolic activity to suggest skeletal metastasis. Incidental CT findings: none IMPRESSION: Small Deauville 4 mediastinal and hilar lymph nodes demonstrate slightly decreased overall activity when compared with prior exam. No new or enlarging lymph nodes seen in the chest, abdomen or pelvis. Interval resolution of hypermetabolic prostate activity. Previously described metabolic activity of the left medial buttocks subcutaneous soft tissues is no longer apparent, favor resolved inflammatory process. New clustered nodular opacities of the posterior right upper lobe with mild FDG uptake, likely due to infection or aspiration. Recommend attention on follow-up. Electronically Signed   By: Yetta Glassman M.D.   On: 11/14/2020 09:50      ASSESSMENT: 70 y.o. [place] man with a diagnosis of mantle cell non-Hodgkin's lymphoma of extranodal and solid organ sites presenting with syncope secondary to bleeding from a large ulcerated duodenal ulcer  (a) s/p coil embolization of the feeding (gastro-duodenal)  artery 01/23/2015  (b) anemia--scant iron stores on bone marrow biopsy--s/p feraheme 02/13/2015  (1) right inguinal lymph node biopsy 01/22/2015 confirms mantle cell lymphoma  (a) bone marrow biopsy 02/06/2015 positive for involvement by the patient's mantle cell lymphoma  (b) IPI score of 5 (high risk) predicts a 5 year progression free survival of 50% with CHOP-Rituxan chemotherapy  (c) MIPI score of 5 (intermediate risk) predicts a median survival of 58 months  (d) the cells were CD5 positive, CD19 and 20+, CD23 negative, lambda restricted  (2) CHOP/Rituxan started 02/09/2015, completed 8 cycles 07/05/2015; total doxorubicin dose 300 mg/M2  (3)  [facility] consultation 04/06/2015. Patient opted against transplant consolidation  (4) rituximab maintenance started 08/16/2015, repeat every 2 months, discontinued February 2019  (a) PET scan had shown a suggestion of recurrence, but see (6) below  (5) PSA increase noted July 2017, back to baseline by December 2017  (a) also noted to be high (13.0) October 2021, resolved with antibiotics  (6) restaging PET scan 11/17/2016 is consistent with disease progression  (a) repeat PET scan 02/02/2017 confirms increase in lymph nodes  (b) PET scan 04/20/2017 shows increasing hypermetabolic adenopathy  (c) bronchoscopy/mediastinoscopy 05/05/2017 shows granulomatous disease, noncaseating, with negative AFB/ GMS stains; <10% of lymphocytes are B-cell with slight lambda excess  (d) PET scan on 07/06/2017 shows mild increase in mediastinal lymphadenopathy. Increased hypermetabolic supraclavicular lymph node.  (e) PET scan 12/24/2017 interpreted as showing evidence of partial response   (f) PET 09/21/2018 shows decreased size and metabolism of scattered measurable nodes; Deauville 4  (g) PET scan 11/29/2019 essentially unchanged except for increased prostate activity (see #10 below).  (h) thoracic surgery (Dr. Minus Breeding) feels it would be very difficult to obtain  a biopsy of the small positive areas in the most recent scan  (i) PET scan 11/13/2020 shows decreased activity in the previously noted subcentimeter Deauville-4 mediastinal and hilar adenopathy   (7) ibrutinib 420 mg daily started 02/09/2017, discontinued March 2019  (a) resumed April 2019 at 560 mg daily  (  b) dose back to 420 mg daily as of May 2020  (8) granulomatous inflammation noted on pathology from 05/05/2017 mediastinoscopy  (a) fungal bacterial and acid-fast studies negative   (b) sarcoidosis is the main differential  (9) 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  Intracranial MRA was negative.  (b) aspirin 81 mg started July 2021  (10) E. coli urinary tract infection diagnosed 12/01/2019, treated with nitrofurantoin  (a) PSA elevated to 13.0 on 12/01/2019  (b) PSA 01/12/2020 normalized (2.27)  (11) nonischemic cardiomyopathy: Followed by cardiology   PLAN: [name] is now just about 6 years out from initial diagnosis of mantle cell lymphoma, presenting with stomach ulcer and stroke.  Clinically his disease is in remission and the current PET shows the few subcentimeter lymph nodes we have been following to have decreased activity.  These may be related to sarcoidosis rather than lymphoma as per prior biopsy.  Accordingly as far as the cancer is concerned the plan is to continue ibrutinib until there is evidence of disease progression.  He is tolerating this well.  He [name] receive his second dose of evusheld this month as he is unlikely to make a good response to his COVID vaccines.  I commended his exercise program and certainly despite his cardiomyopathy he is normally active.  He is prostatitis has resolved and I am going to stop checking PSAs.  He is due for an echo later this year and  that is being arranged through cardiology  He [name] see [place] again in about 6 months.  Total encounter time 25 minutes.*   Clotiel Troop, Virgie Dad, MD  11/15/20 1:41 PM Medical Oncology and Hematology [facility] [place], [place] [address] Tel. [phone]    Fax. [phone]   I, Wilburn Mylar, am acting as scribe for Dr. Virgie Dad. [place] Every.  I, Lurline Del MD, have reviewed the above documentation for accuracy and completeness, and I agree with the above.   *Total Encounter Time as defined by the Centers for Medicare and Medicaid Services includes, in addition to the face-to-face time of a patient visit (documented in the note above) non-face-to-face time: obtaining and reviewing outside history, ordering and reviewing medications, tests or procedures, care coordination (communications with other health care professionals or caregivers) and documentation in the medical record.

## 2020-11-15 ENCOUNTER — Encounter: Payer: Self-pay | Admitting: Oncology

## 2020-11-15 ENCOUNTER — Inpatient Hospital Stay (HOSPITAL_BASED_OUTPATIENT_CLINIC_OR_DEPARTMENT_OTHER): Payer: Medicare HMO | Admitting: Oncology

## 2020-11-15 ENCOUNTER — Other Ambulatory Visit: Payer: Self-pay

## 2020-11-15 VITALS — BP 125/63 | HR 63 | Temp 97.9°F | Resp 18 | Ht 70.0 in | Wt 188.5 lb

## 2020-11-15 DIAGNOSIS — C8315 Mantle cell lymphoma, lymph nodes of inguinal region and lower limb: Secondary | ICD-10-CM | POA: Diagnosis not present

## 2020-11-15 DIAGNOSIS — R972 Elevated prostate specific antigen [PSA]: Secondary | ICD-10-CM | POA: Diagnosis not present

## 2020-11-15 DIAGNOSIS — Z8249 Family history of ischemic heart disease and other diseases of the circulatory system: Secondary | ICD-10-CM | POA: Diagnosis not present

## 2020-11-15 DIAGNOSIS — Z841 Family history of disorders of kidney and ureter: Secondary | ICD-10-CM | POA: Diagnosis not present

## 2020-11-15 DIAGNOSIS — Z79899 Other long term (current) drug therapy: Secondary | ICD-10-CM | POA: Diagnosis not present

## 2020-11-15 DIAGNOSIS — I428 Other cardiomyopathies: Secondary | ICD-10-CM | POA: Diagnosis not present

## 2020-11-15 DIAGNOSIS — Z8042 Family history of malignant neoplasm of prostate: Secondary | ICD-10-CM | POA: Diagnosis not present

## 2020-11-15 DIAGNOSIS — E785 Hyperlipidemia, unspecified: Secondary | ICD-10-CM | POA: Diagnosis not present

## 2020-11-15 DIAGNOSIS — C8319 Mantle cell lymphoma, extranodal and solid organ sites: Secondary | ICD-10-CM

## 2020-11-15 DIAGNOSIS — Z833 Family history of diabetes mellitus: Secondary | ICD-10-CM | POA: Diagnosis not present

## 2020-11-15 MED ORDER — CILGAVIMAB (PART OF EVUSHELD) INJECTION: 300.0000 mg | Freq: Once | INTRAMUSCULAR | Status: DC

## 2020-11-15 MED ORDER — TIXAGEVIMAB (PART OF EVUSHELD) INJECTION: 300.0000 mg | Freq: Once | INTRAMUSCULAR | Status: DC

## 2020-11-15 MED ORDER — METHYLPREDNISOLONE SODIUM SUCC 125 MG IJ SOLR: 125.0000 mg | Freq: Once | INTRAMUSCULAR | Status: DC | PRN

## 2020-11-15 MED ORDER — DIPHENHYDRAMINE HCL 50 MG/ML IJ SOLN: 50.0000 mg | Freq: Once | INTRAMUSCULAR | Status: DC | PRN

## 2020-11-15 MED ORDER — ALBUTEROL SULFATE HFA 108 (90 BASE) MCG/ACT IN AERS: 2.0000 | INHALATION_SPRAY | Freq: Once | RESPIRATORY_TRACT | Status: DC | PRN

## 2020-11-15 MED ORDER — IMBRUVICA 420 MG PO TABS: 1.0000 | ORAL_TABLET | Freq: Every day | ORAL | 6 refills | Status: DC

## 2020-11-15 MED ORDER — EPINEPHRINE 0.3 MG/0.3ML IJ SOAJ: 0.3000 mg | Freq: Once | INTRAMUSCULAR | Status: DC | PRN

## 2020-11-15 NOTE — Addendum Note (Signed)
Addended by: Tora Kindred on: 11/15/2020 02:21 PM   Modules accepted: Orders

## 2020-11-15 NOTE — Addendum Note (Signed)
Addended by: Chauncey Cruel on: 11/15/2020 05:25 PM   Modules accepted: Orders, SmartSet

## 2020-11-15 NOTE — Telephone Encounter (Signed)
No entry 

## 2020-11-16 ENCOUNTER — Other Ambulatory Visit (HOSPITAL_COMMUNITY): Payer: Self-pay

## 2020-11-16 MED ORDER — IBRUTINIB 420 MG PO TABS
420.0000 mg | ORAL_TABLET | Freq: Every day | ORAL | 6 refills | Status: DC
  Filled 2020-11-27: qty 28, 28d supply, fill #0
  Filled 2020-12-25: qty 28, 28d supply, fill #1

## 2020-11-18 NOTE — Progress Notes (Deleted)
[facility]  Electrophysiology Office Note:    Date:  11/18/2020   ID:  [name], DOB 1950/05/29, MRN [id number]  PCP:  Shelda Pal, DO  [facility] Cardiologist:  Pixie Casino, MD  [facility] Electrophysiologist:  None   Referring MD: Larey Dresser, MD   Chief Complaint: Cryptogenic stroke  History of Present Illness:    [name] is a 70 y.o. male who presents for an evaluation of cryptogenic stroke at the request of Dr. Aundra Dubin. Their medical history includes mantle cell lymphoma, hyperlipidemia, GERD, hypertension, multiple strokes.  The patient saw Dr. Aundra Dubin October 04, 2020.  He was seen by Dr. Aundra Dubin for his chronic systolic heart failure with an ejection fraction of 25% in May of this year.  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  Genetic testing is pending.  A cardiac MRI has also been performed which showed an ejection fraction of 43%.  There was mid wall LGE in the basal inferior and basal inferoseptum.  He previously wore a 2-week ZIO monitor after his strokes which were showed significant PAC burden but no atrial fibrillation was reported.  He is referred for loop recorder implantation for ongoing surveillance for atrial fibrillation.     Past Medical History:  Diagnosis Date   Anemia    Cancer ([place])    Lymphoma   Essential hypertension    GERD (gastroesophageal reflux disease)    GI bleed    HLD (hyperlipidemia)    Weakness     Past Surgical History:  Procedure Laterality Date   ESOPHAGOGASTRODUODENOSCOPY Left 01/18/2015   Procedure: ESOPHAGOGASTRODUODENOSCOPY (EGD);  Surgeon: Wilford Corner, MD;  Location[facility] ENDOSCOPY;  Service: Endoscopy;  Laterality: Left;   INGUINAL HERNIA REPAIR Right 01/22/2015   Procedure: RIGHT INGUINAL LYMPH NODE BX;  Surgeon: Donnie Mesa, MD;  Location: [place];  Service: General;  Laterality: Right;   MEDIASTINOSCOPY N/A 05/05/2017   Procedure:  MEDIASTINOSCOPY;  Surgeon: Grace Isaac, MD;  Location: [facility] OR;  Service: Thoracic;  Laterality: N/A;   PORTACATH PLACEMENT  01/26/2015    power port with tip SVC/[place]   SKIN SURGERY     Small benign cysts over left scalp removed   VIDEO BRONCHOSCOPY WITH ENDOBRONCHIAL ULTRASOUND N/A 05/05/2017   Procedure: VIDEO BRONCHOSCOPY WITH ENDOBRONCHIAL ULTRASOUND;  Surgeon: Grace Isaac, MD;  Location: [place];  Service: Thoracic;  Laterality: N/A;    Current Medications: No outpatient medications have been marked as taking for the 11/19/20 encounter (Appointment) with Vickie Epley, MD.     Allergies:   Patient has no known allergies.   Social History   Socioeconomic History   Marital status: Married    Spouse name: Not on file   Number of children: Not on file   Years of education: Not on file   Highest education level: Not on file  Occupational History   Occupation: Retired  Tobacco Use   Smoking status: Never   Smokeless tobacco: Never  Vaping Use   Vaping Use: Never used  Substance and Sexual Activity   Alcohol use: Yes    Comment: occasional beer   Drug use: No   Sexual activity: Not on file  Other Topics Concern   Not on file  Social History Narrative   Not on file   Social Determinants of Health   Financial Resource Strain: Not on file  Food Insecurity: Not on file  Transportation Needs: Not on file  Physical Activity: Not on file  Stress: Not on file  Social Connections: Not on file     Family History: The patient's family history includes Diabetes in his sister; Hypertension in his father, mother, and sister; Kidney failure in his father; Lupus in his sister; Prostate cancer in his brother.  ROS:   Please see the history of present illness.    All other systems reviewed and are negative.  EKGs/Labs/Other Studies Reviewed:    The following studies were reviewed today:  October 16, 2020 cardiac MRI IMPRESSION: 1.  Mildly dilated LV  with EF 43%, diffuse hypokinesis. 2.  Normal RV size with EF 48%. 3. On delayed enhancement imaging, there were small areas of mid-wall LGE in the basal inferior and basal inferoseptal walls. This is not a pattern that is suggestive of prior MI or amyloidosis. Possible prior myocarditis, consider sarcoidosis. 4. ECV percentage mildly elevated, not in a range concerning for amyloidosis.   September 12, 2019 ZIO 14% PAC       EKG:  The ekg ordered today demonstrates ***   Recent Labs: 11/13/2020: ALT 25; BUN 22; Creatinine, Ser 1.27; Hemoglobin 13.3; Platelets 256; Potassium 4.7; Sodium 141  Recent Lipid Panel    Component Value Date/Time   CHOL 122 08/08/2020 1129   CHOL 124 10/21/2019 0904   TRIG 106.0 08/08/2020 1129   HDL 45.40 08/08/2020 1129   HDL 41 10/21/2019 0904   CHOLHDL 3 08/08/2020 1129   VLDL 21.2 08/08/2020 1129   LDLCALC 55 08/08/2020 1129   LDLCALC 67 10/21/2019 0904    Physical Exam:    VS:  There were no vitals taken for this visit.    Wt Readings from Last 3 Encounters:  11/15/20 188 lb 8 oz (85.5 kg)  10/29/20 189 lb 9.6 oz (86 kg)  10/04/20 190 lb 9.6 oz (86.5 kg)     GEN: *** Well nourished, well developed in no acute distress HEENT: Normal NECK: No JVD; No carotid bruits LYMPHATICS: No lymphadenopathy CARDIAC: ***RRR, no murmurs, rubs, gallops RESPIRATORY:  Clear to auscultation without rales, wheezing or rhonchi  ABDOMEN: Soft, non-tender, non-distended MUSCULOSKELETAL:  No edema; No deformity  SKIN: Warm and dry NEUROLOGIC:  Alert and oriented x 3 PSYCHIATRIC:  Normal affect       ASSESSMENT:    No diagnosis found. PLAN:    In order of problems listed above:   Eliquis start No loop      Total time spent with patient today *** minutes. This includes reviewing records, evaluating the patient and coordinating care.  Medication Adjustments/Labs and Tests Ordered: Current medicines are reviewed at length with the patient  today.  Concerns regarding medicines are outlined above.  No orders of the defined types were placed in this encounter.  No orders of the defined types were placed in this encounter.    Signed, Hilton Cork. Quentin Ore, MD, [facility], [facility] 11/18/2020 7:27 PM    Electrophysiology [facility]

## 2020-11-19 ENCOUNTER — Other Ambulatory Visit: Payer: Self-pay

## 2020-11-19 ENCOUNTER — Ambulatory Visit: Payer: Medicare HMO | Admitting: Cardiology

## 2020-11-19 ENCOUNTER — Other Ambulatory Visit: Payer: Self-pay | Admitting: Adult Health

## 2020-11-19 ENCOUNTER — Institutional Professional Consult (permissible substitution): Payer: Medicare HMO | Admitting: Cardiology

## 2020-11-19 ENCOUNTER — Other Ambulatory Visit (HOSPITAL_COMMUNITY): Payer: Self-pay

## 2020-11-19 VITALS — BP 114/74 | HR 57 | Ht 72.0 in | Wt 188.0 lb

## 2020-11-19 DIAGNOSIS — I4892 Unspecified atrial flutter: Secondary | ICD-10-CM

## 2020-11-19 DIAGNOSIS — I5022 Chronic systolic (congestive) heart failure: Secondary | ICD-10-CM

## 2020-11-19 DIAGNOSIS — C8319 Mantle cell lymphoma, extranodal and solid organ sites: Secondary | ICD-10-CM

## 2020-11-19 DIAGNOSIS — I6329 Cerebral infarction due to unspecified occlusion or stenosis of other precerebral arteries: Secondary | ICD-10-CM

## 2020-11-19 MED ORDER — APIXABAN 5 MG PO TABS
5.0000 mg | ORAL_TABLET | Freq: Two times a day (BID) | ORAL | 11 refills | Status: DC
Start: 2020-11-19 — End: 2021-12-16

## 2020-11-19 NOTE — Progress Notes (Signed)
Signed and held evusheld orders.

## 2020-11-19 NOTE — Progress Notes (Signed)
Electrophysiology Office Note:    Date:  11/19/2020   ID:  [name], DOB 09-Jan-1951, MRN [id number]  PCP:  [name], [place] Cardiologist:  Gary Casino, MD  [facility] Electrophysiologist:  Gary Epley, MD   Referring MD: Gary Dresser, MD   Chief Complaint: Stroke  History of Present Illness:    [name] is a 70 y.o. male who presents for an evaluation of stroke at the request of Dr. Aundra Taylor. Their medical history includes mantle cell lymphoma, GERD, GI bleeding, hypertension.  He saw Dr. Aundra Taylor October 04, 2020.  In June 2021 he was admitted to the hospital with a stroke.  He had an acute pontine infarct but chronic infarcts in the right mid frontal lobe and superior left cerebellum.  Zio patch was worn for 2 weeks.  He was referred to me to discuss possible loop implant but on my review of his prior ZIO monitor I see evidence of atrial arrhythmias. He is in clinic today with his wife.    Past Medical History:  Diagnosis Date   Anemia    Cancer ([place])    Lymphoma   Essential hypertension    GERD (gastroesophageal reflux disease)    GI bleed    HLD (hyperlipidemia)    Weakness     Past Surgical History:  Procedure Laterality Date   ESOPHAGOGASTRODUODENOSCOPY Left 01/18/2015   Procedure: ESOPHAGOGASTRODUODENOSCOPY (EGD);  Surgeon: Gary Corner, MD;  Location: [facility] ENDOSCOPY;  Service: Endoscopy;  Laterality: Left;   INGUINAL HERNIA REPAIR Right 01/22/2015   Procedure: RIGHT INGUINAL LYMPH NODE BX;  Surgeon: Gary Mesa, MD;  Location: [place];  Service: General;  Laterality: Right;   MEDIASTINOSCOPY N/A 05/05/2017   Procedure: MEDIASTINOSCOPY;  Surgeon: Gary Isaac, MD;  Location: [facility] OR;  Service: Thoracic;  Laterality: N/A;   PORTACATH PLACEMENT  01/26/2015    power port with tip SVC/RA [place]   SKIN SURGERY     Small benign cysts over left scalp removed   VIDEO BRONCHOSCOPY WITH ENDOBRONCHIAL ULTRASOUND  N/A 05/05/2017   Procedure: VIDEO BRONCHOSCOPY WITH ENDOBRONCHIAL ULTRASOUND;  Surgeon: Gary Isaac, MD;  Location: [facility] OR;  Service: Thoracic;  Laterality: N/A;    Current Medications: Current Meds  Medication Sig   aspirin 81 MG EC tablet Take 1 tablet (81 mg total) by mouth daily. Swallow whole.   atorvastatin (LIPITOR) 80 MG tablet TAKE 1 TABLET BY MOUTH EVERY DAY   carvedilol (COREG) 12.5 MG tablet Take 1 tablet (12.5 mg total) by mouth 2 (two) times daily with a meal.   empagliflozin (JARDIANCE) 10 MG TABS tablet Take 1 tablet (10 mg total) by mouth daily before breakfast.   ibrutinib (IMBRUVICA) 420 MG TABS TAKE 1 TABLET BY MOUTH DAILY.   ibrutinib 420 MG TABS Take 1 tablet (420 mg) by mouth daily.   sacubitril-valsartan (ENTRESTO) 97-103 MG Take 1 tablet by mouth 2 (two) times daily.   spironolactone (ALDACTONE) 25 MG tablet Take 1 tablet (25 mg total) by mouth daily.     Allergies:   Patient has no known allergies.   Social History   Socioeconomic History   Marital status: Married    Spouse name: Not on file   Number of children: Not on file   Years of education: Not on file   Highest education level: Not on file  Occupational History   Occupation: Retired  Tobacco Use   Smoking status: Never   Smokeless tobacco: Never  Vaping Use   Vaping Use: Never used  Substance and Sexual Activity   Alcohol use: Yes    Comment: occasional beer   Drug use: No   Sexual activity: Not on file  Other Topics Concern   Not on file  Social History Narrative   Not on file   Social Determinants of Health   Financial Resource Strain: Not on file  Food Insecurity: Not on file  Transportation Needs: Not on file  Physical Activity: Not on file  Stress: Not on file  Social Connections: Not on file     Family History: The patient's family history includes Diabetes in his sister; Hypertension in his father, mother, and sister; Kidney failure in his father; Lupus in his sister;  Prostate cancer in his brother.  ROS:   Please see the history of present illness.    All other systems reviewed and are negative.  EKGs/Labs/Other Studies Reviewed:    The following studies were reviewed today:  September 08, 2019 ZIO monitor personally reviewed Atrial fibrillation/flutter present  Evidence of atrial flutter and possible atrial fibrillation.  Best seen in the 2 strips above.  In the first rib, there is regular atrial activity consistent with atrial flutter.  This is also seen around the PVCs and the first rib.  In the second strip, after the flutter terminates, you can see resumption of sinus activity.   EKG:  The ekg ordered today demonstrates sinus arrhythmia.   Recent Labs: 11/13/2020: ALT 25; BUN 22; Creatinine, Ser 1.27; Hemoglobin 13.3; Platelets 256; Potassium 4.7; Sodium 141  Recent Lipid Panel    Component Value Date/Time   CHOL 122 08/08/2020 1129   CHOL 124 10/21/2019 0904   TRIG 106.0 08/08/2020 1129   HDL 45.40 08/08/2020 1129   HDL 41 10/21/2019 0904   CHOLHDL 3 08/08/2020 1129   VLDL 21.2 08/08/2020 1129   LDLCALC 55 08/08/2020 1129   LDLCALC 67 10/21/2019 0904    Physical Exam:    VS:  BP 114/74   Pulse (!) 57   Ht 6' (1.829 m)   Wt 188 lb (85.3 kg)   BMI 25.50 kg/m     Wt Readings from Last 3 Encounters:  11/19/20 188 lb (85.3 kg)  11/15/20 188 lb 8 oz (85.5 kg)  10/29/20 189 lb 9.6 oz (86 kg)     GEN:  Well nourished, well developed in no acute distress HEENT: Normal NECK: No JVD; No carotid bruits LYMPHATICS: No lymphadenopathy CARDIAC: RRR, no murmurs, rubs, gallops RESPIRATORY:  Clear to auscultation without rales, wheezing or rhonchi  ABDOMEN: Soft, non-tender, non-distended MUSCULOSKELETAL:  No edema; No deformity  SKIN: Warm and dry NEUROLOGIC:  Alert and oriented x 3 PSYCHIATRIC:  Normal affect       ASSESSMENT:    1. Chronic systolic heart failure ([place])   2. 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 Atrial flutter, unspecified type ([place])    PLAN:    In order of problems listed above:   #Atrial flutter New diagnosis.  He has had prior strokes.  Anticoagulation is indicated.  Will start Eliquis 5 mg by mouth twice daily.  I will also run this by the pharmacist to make sure there is no drug interactions between this and his oral chemotherapy agents.  #Chronic systolic heart failure NYHA class II today.  Very active.  Warm and dry on exam.  Follows with Dr. Aundra Taylor in the heart failure clinic.    #  Mantle cell lymphoma On oral chemo.  Follow-up with heart failure clinic.   Medication Adjustments/Labs and Tests Ordered: Current medicines are reviewed at length with the patient today.  Concerns regarding medicines are outlined above.  No orders of the defined types were placed in this encounter.  No orders of the defined types were placed in this encounter.    Signed, Hilton Cork. Quentin Ore, MD, [facility], [facility] 11/19/2020 10:33 AM    Electrophysiology [facility]

## 2020-11-19 NOTE — Patient Instructions (Signed)
Medication Instructions:  Your physician has recommended you make the following change in your medication:    STOP aspirin  2.    START taking eliquis 5 mg-  Take one tablet by mouth twice a day  *If you need a refill on your cardiac medications before your next appointment, please call your pharmacy*  Lab Work: None ordered. If you have labs (blood work) drawn today and your tests are completely normal, you will receive your results only by: [place] (if you have MyChart) OR A paper copy in the mail If you have any lab test that is abnormal or we need to change your treatment, we will call you to review the results.  Testing/Procedures: None ordered.  Follow-Up: At [facility], you and your health needs are our priority.  As part of our continuing mission to provide you with exceptional heart care, we have created designated Provider Care Teams.  These Care Teams include your primary Cardiologist (physician) and Advanced Practice Providers (APPs -  Physician Assistants and Nurse Practitioners) who all work together to provide you with the care you need, when you need it.  Your next appointment:   Your physician wants you to follow-up in: AS NEEDED  Apixaban Tablets What is this medication? APIXABAN (a PIX a ban) prevents or treats blood clots. It is also used to lower the risk of stroke in people with AFib (atrial fibrillation). It belongs to a group of medications called blood thinners. This medicine may be used for other purposes; ask your health care provider or pharmacist if you have questions. COMMON BRAND NAME(S): Eliquis What should I tell my care team before I take this medication? They need to know if you have any of these conditions: Antiphospholipid antibody syndrome Bleeding disorder History of bleeding in the brain History of blood clots History of stomach bleeding Kidney disease Liver disease Mechanical heart valve Spinal surgery An unusual or  allergic reaction to apixaban, other medications, foods, dyes, or preservatives Pregnant or trying to get pregnant Breast-feeding How should I use this medication? Take this medication by mouth. For your therapy to work as well as possible, take each dose exactly as prescribed on the prescription label. Do not skip doses. Skipping doses or stopping this medication can increase your risk of a blood clot or stroke. Keep taking this medication unless your care team tells you to stop. Take it as directed on the prescription label at the same time every day. You can take it with or without food. If it upsets your stomach, take it with food. A special MedGuide will be given to you by the pharmacist with each prescription and refill. Be sure to read this information carefully each time. Talk to your care team about the use of this medication in children. Special care may be needed. Overdosage: If you think you have taken too much of this medicine contact a poison control center or emergency room at once. NOTE: This medicine is only for you. Do not share this medicine with others. What if I miss a dose? If you miss a dose, take it as soon as you can. If it is almost time for your next dose, take only that dose. Do not take double or extra doses. What may interact with this medication? This medication may interact with the following: Aspirin and aspirin-like medications Certain medications for fungal infections like itraconazole and ketoconazole Certain medications for seizures like carbamazepine and phenytoin Certain medications for blood clots like enoxaparin, dalteparin,  heparin, and warfarin Clarithromycin NSAIDs, medications for pain and inflammation, like ibuprofen or naproxen Rifampin Ritonavir St. John's wort This list may not describe all possible interactions. Give your health care provider a list of all the medicines, herbs, non-prescription drugs, or dietary supplements you use. Also tell  them if you smoke, drink alcohol, or use illegal drugs. Some items may interact with your medicine. What should I watch for while using this medication? Visit your healthcare professional for regular checks on your progress. You may need blood work done while you are taking this medication. Your condition will be monitored carefully while you are receiving this medication. It is important not to miss any appointments. Avoid sports and activities that might cause injury while you are using this medication. Severe falls or injuries can cause unseen bleeding. Be careful when using sharp tools or knives. Consider using an Copy. Take special care brushing or flossing your teeth. Report any injuries, bruising, or red spots on the skin to your healthcare professional. If you are going to need surgery or other procedure, tell your healthcare professional that you are taking this medication. Wear a medical ID bracelet or chain. Carry a card that describes your disease and details of your medication and dosage times. What side effects may I notice from receiving this medication? Side effects that you should report to your care team as soon as possible: Allergic reactions-skin rash, itching, hives, swelling of the face, lips, tongue, or throat Bleeding-bloody or black, tar-like stools, vomiting blood or brown material that looks like coffee grounds, red or dark brown urine, small red or purple spots on the skin, unusual bruising or bleeding Bleeding in the brain-severe headache, stiff neck, confusion, dizziness, change in vision, numbness or weakness of the face, arm, or leg, trouble speaking, trouble walking, vomiting Heavy periods This list may not describe all possible side effects. Call your doctor for medical advice about side effects. You may report side effects to FDA at 1-800-FDA-1088. Where should I keep my medication? Keep out of the reach of children and pets. Store at room temperature  between 20 and 25 degrees C (68 and 77 degrees F). Get rid of any unused medication after the expiration date. To get rid of medications that are no longer needed or expired: Take the medication to a medication take-back program. Check with your pharmacy or law enforcement to find a location. If you cannot return the medication, check the label or package insert to see if the medication should be thrown out in the garbage or flushed down the toilet. If you are not sure, ask your care team. If it is safe to put in the trash, empty the medication out of the container. Mix the medication with cat litter, dirt, coffee grounds, or other unwanted substance. Seal the mixture in a bag or container. Put it in the trash. NOTE: This sheet is a summary. It may not cover all possible information. If you have questions about this medicine, talk to your doctor, pharmacist, or health care provider.  2022 Elsevier/Gold Standard (2020-02-17 12:37:23)

## 2020-11-27 ENCOUNTER — Other Ambulatory Visit (HOSPITAL_COMMUNITY): Payer: Self-pay

## 2020-11-28 ENCOUNTER — Emergency Department (HOSPITAL_COMMUNITY): Payer: Medicare HMO

## 2020-11-28 ENCOUNTER — Emergency Department (HOSPITAL_COMMUNITY)
Admission: EM | Admit: 2020-11-28 | Discharge: 2020-11-28 | Disposition: A | Discharge status: Home / Self Care | Payer: Medicare HMO | Attending: Emergency Medicine | Admitting: Emergency Medicine

## 2020-11-28 DIAGNOSIS — Z79899 Other long term (current) drug therapy: Secondary | ICD-10-CM | POA: Insufficient documentation

## 2020-11-28 DIAGNOSIS — I10 Essential (primary) hypertension: Secondary | ICD-10-CM | POA: Diagnosis not present

## 2020-11-28 DIAGNOSIS — R9431 Abnormal electrocardiogram [ECG] [EKG]: Secondary | ICD-10-CM | POA: Diagnosis not present

## 2020-11-28 DIAGNOSIS — Z8572 Personal history of non-Hodgkin lymphomas: Secondary | ICD-10-CM | POA: Insufficient documentation

## 2020-11-28 DIAGNOSIS — Z7901 Long term (current) use of anticoagulants: Secondary | ICD-10-CM | POA: Insufficient documentation

## 2020-11-28 DIAGNOSIS — L568 Other specified acute skin changes due to ultraviolet radiation: Secondary | ICD-10-CM

## 2020-11-28 DIAGNOSIS — H538 Other visual disturbances: Secondary | ICD-10-CM | POA: Diagnosis not present

## 2020-11-28 DIAGNOSIS — H5371 Glare sensitivity: Secondary | ICD-10-CM | POA: Diagnosis not present

## 2020-11-28 DIAGNOSIS — H5319 Other subjective visual disturbances: Secondary | ICD-10-CM | POA: Diagnosis not present

## 2020-11-28 DIAGNOSIS — R7989 Other specified abnormal findings of blood chemistry: Secondary | ICD-10-CM

## 2020-11-28 DIAGNOSIS — R970 Elevated carcinoembryonic antigen [CEA]: Secondary | ICD-10-CM | POA: Diagnosis not present

## 2020-11-28 LAB — URINALYSIS, ROUTINE W REFLEX MICROSCOPIC
Bacteria, UA: NONE SEEN
Bilirubin Urine: NEGATIVE
Glucose, UA: >=500 mg/dL — AB
Hgb urine dipstick: NEGATIVE
Ketones, ur: 5 mg/dL — AB
Leukocytes,Ua: NEGATIVE
Nitrite: NEGATIVE
Protein, ur: 30 mg/dL — AB
Specific Gravity, Urine: 1.025 (ref 1.005–1.030)
pH: 5 (ref 5.0–8.0)

## 2020-11-28 LAB — CBC
HCT: 42.8 % (ref 39.0–52.0)
Hemoglobin: 14 g/dL (ref 13.0–17.0)
MCH: 28.9 pg (ref 26.0–34.0)
MCHC: 32.7 g/dL (ref 30.0–36.0)
MCV: 88.2 fL (ref 80.0–100.0)
Platelets: 289 10*3/uL (ref 150–400)
RBC: 4.85 MIL/uL (ref 4.22–5.81)
RDW: 13.7 % (ref 11.5–15.5)
WBC: 4.8 10*3/uL (ref 4.0–10.5)
nRBC: 0 % (ref 0.0–0.2)

## 2020-11-28 LAB — COMPREHENSIVE METABOLIC PANEL
ALT: 21 U/L (ref 0–44)
AST: 25 U/L (ref 15–41)
Albumin: 3.9 g/dL (ref 3.5–5.0)
Alkaline Phosphatase: 33 U/L — ABNORMAL LOW (ref 38–126)
Anion gap: 7 (ref 5–15)
BUN: 25 mg/dL — ABNORMAL HIGH (ref 8–23)
CO2: 28 mmol/L (ref 22–32)
Calcium: 9 mg/dL (ref 8.9–10.3)
Chloride: 103 mmol/L (ref 98–111)
Creatinine, Ser: 1.7 mg/dL — ABNORMAL HIGH (ref 0.61–1.24)
GFR, Estimated: 44 mL/min — ABNORMAL LOW (ref >60)
Glucose, Bld: 101 mg/dL — ABNORMAL HIGH (ref 70–99)
Potassium: 3.8 mmol/L (ref 3.5–5.1)
Sodium: 138 mmol/L (ref 135–145)
Total Bilirubin: 1.4 mg/dL — ABNORMAL HIGH (ref 0.3–1.2)
Total Protein: 6.1 g/dL — ABNORMAL LOW (ref 6.5–8.1)

## 2020-11-28 MED ORDER — SODIUM CHLORIDE 0.9 % IV BOLUS
1000.0000 mL | Freq: Once | INTRAVENOUS | Status: AC
  Administered 2020-11-28: 1000 mL via INTRAVENOUS

## 2020-11-28 NOTE — Discharge Instructions (Signed)
1.  Please continue taking your medications as prescribed. 2.  Follow-up with your primary care physician to recheck your serum creatinine 3.  Follow-up with your eye doctor regarding your photosensitivity 4.  Drink half your body weight in ounces of water daily, less she began having swelling or shortness of breath.  Return for any new or worsening symptoms.

## 2020-11-28 NOTE — ED Provider Notes (Signed)
[facility] EMERGENCY DEPARTMENT Provider Note   CSN: [id number] Arrival date & time: 11/28/20  1200     History No chief complaint on file.   [name] is a 70 y.o. male who presents emergency department with chief complaint of visual disturbance.  Patient has a past medical history of lymphoma, previous CVA atrial flutter. Patient states that he was placed on Eliquis about a week ago for atrial flutter by his cardiologist Dr. Quentin Ore.  Patient states that he has had 2 episodes after taking Eliquis in the morning where he has had photosensitivity lasting about 15 minutes.  He has no other associated vision changes.  He denies flashes of light or brightness as discussed in the triage note.  Patient states that he has had some issues with memory recall at times since he had his stroke a year ago.  He states that this week there were couple of episodes where he had trouble with recall. He states that today his wife dropped off some paperwork at his doctor's office and when she came back out to the car she was hysterical as someone suggested he could be having a stroke and she made him come to the emergency department.  He has no complaints at this time.  He denies any confusion, eye pain, headache, unilateral weakness, difficulty with speech, difficulty with swallowing, ataxia.  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 ([place]) 08/11/2019   Ectopic beats 08/11/2019   Cardiomyopathy ([place]) 08/11/2019   Carotid artery disease ([place]) 08/11/2019   TIA (transient ischemic attack) 08/01/2019   Left sided numbness 08/01/2019   Cognitive and behavioral changes 04/25/2019   Pre-syncope  04/25/2019   Bleeding ulcer with perforation ([place]) 02/09/2015   Anemia in neoplastic disease 02/09/2015   GI bleed 02/07/2015   Symptomatic anemia 02/07/2015   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 ([place]) 01/25/2015   Acute GI bleeding    Intraabdominal mass 01/21/2015   Mediastinal mass    Essential hypertension    HLD (hyperlipidemia)    Acute blood loss anemia     Past Surgical History:  Procedure Laterality Date   ESOPHAGOGASTRODUODENOSCOPY Left 01/18/2015   Procedure: ESOPHAGOGASTRODUODENOSCOPY (EGD);  Surgeon: Wilford Corner, MD;  Location: [facility] ENDOSCOPY;  Service: Endoscopy;  Laterality: Left;   INGUINAL HERNIA REPAIR Right 01/22/2015   Procedure: RIGHT INGUINAL LYMPH NODE BX;  Surgeon: Donnie Mesa, MD;  Location: [facility];  Service: General;  Laterality: Right;   MEDIASTINOSCOPY N/A 05/05/2017   Procedure: MEDIASTINOSCOPY;  Surgeon: Grace Isaac, MD;  Location: [facility];  Service: Thoracic;  Laterality: N/A;   PORTACATH PLACEMENT  01/26/2015    power port with tip SVC/RA [place]   SKIN SURGERY     Small benign cysts over left scalp removed   VIDEO BRONCHOSCOPY WITH ENDOBRONCHIAL ULTRASOUND N/A 05/05/2017   Procedure: VIDEO BRONCHOSCOPY WITH ENDOBRONCHIAL ULTRASOUND;  Surgeon: Grace Isaac, MD;  Location: [facility] OR;  Service: Thoracic;  Laterality: N/A;       Family History  Problem Relation Age of Onset   Hypertension Mother    Hypertension  Father    Kidney failure Father    Hypertension Sister    Diabetes Sister    Prostate cancer Brother    Lupus Sister     Social History   Tobacco Use   Smoking status: Never   Smokeless tobacco: Never  Vaping Use   Vaping Use: Never used  Substance Use Topics   Alcohol use: Yes    Comment: occasional beer   Drug use: No    Home Medications Prior to Admission medications   Medication Sig Start Date End Date Taking? Authorizing Provider  apixaban (ELIQUIS) 5 MG TABS tablet Take 1 tablet (5 mg  total) by mouth 2 (two) times daily. 11/19/20   Vickie Epley, MD  atorvastatin (LIPITOR) 80 MG tablet TAKE 1 TABLET BY MOUTH EVERY DAY 08/29/20   Shelda Pal, DO  carvedilol (COREG) 12.5 MG tablet Take 1 tablet (12.5 mg total) by mouth 2 (two) times daily with a meal. 10/04/20   Larey Dresser, MD  empagliflozin (JARDIANCE) 10 MG TABS tablet Take 1 tablet (10 mg total) by mouth daily before breakfast. 09/24/20   Larey Dresser, MD  ibrutinib (IMBRUVICA) 420 MG TABS TAKE 1 TABLET BY MOUTH DAILY. 11/15/20 11/15/21  Magrinat, Virgie Dad, MD  ibrutinib 420 MG TABS Take 1 tablet (420 mg) by mouth daily. 11/15/20   Magrinat, Virgie Dad, MD  sacubitril-valsartan (ENTRESTO) 97-103 MG Take 1 tablet by mouth 2 (two) times daily. 09/24/20   Larey Dresser, MD  spironolactone (ALDACTONE) 25 MG tablet Take 1 tablet (25 mg total) by mouth daily. 10/29/20   Larey Dresser, MD    Allergies    Patient has no known allergies.  Review of Systems   Review of Systems Ten systems reviewed and are negative for acute change, except as noted in the HPI.   Physical Exam Updated Vital Signs BP 104/66   Pulse (!) 48   Temp 98.1 F (36.7 C) (Oral)   Resp 15   SpO2 100%   Physical Exam Vitals and nursing note reviewed.  Constitutional:      General: He is not in acute distress.    Appearance: He is well-developed. He is not diaphoretic.  HENT:     Head: Normocephalic and atraumatic.  Eyes:     General: No scleral icterus.    Extraocular Movements: Extraocular movements intact.     Conjunctiva/sclera: Conjunctivae normal.     Pupils: Pupils are equal, round, and reactive to light.     Funduscopic exam:    Right eye: No hemorrhage or exudate.        Left eye: No hemorrhage or exudate.  Cardiovascular:     Rate and Rhythm: Normal rate and regular rhythm.     Heart sounds: Normal heart sounds.  Pulmonary:     Effort: Pulmonary effort is normal. No respiratory distress.     Breath sounds:  Normal breath sounds.  Abdominal:     Palpations: Abdomen is soft.     Tenderness: There is no abdominal tenderness.  Musculoskeletal:     Cervical back: Normal range of motion and neck supple.  Skin:    General: Skin is warm and dry.  Neurological:     General: No focal deficit present.     Mental Status: He is alert and oriented to person, place, and time.     Cranial Nerves: No cranial nerve deficit.     Sensory: No sensory deficit.     Motor: No weakness.  Coordination: Coordination normal.     Gait: Gait normal.     Deep Tendon Reflexes: Reflexes normal.  Psychiatric:        Behavior: Behavior normal.    ED Results / Procedures / Treatments   Labs (all labs ordered are listed, but only abnormal results are displayed) Labs Reviewed  COMPREHENSIVE METABOLIC PANEL - Abnormal; Notable for the following components:      Result Value   Glucose, Bld 101 (*)    BUN 25 (*)    Creatinine, Ser 1.70 (*)    Total Protein 6.1 (*)    Alkaline Phosphatase 33 (*)    Total Bilirubin 1.4 (*)    GFR, Estimated 44 (*)    All other components within normal limits  URINALYSIS, ROUTINE W REFLEX MICROSCOPIC - Abnormal; Notable for the following components:   APPearance HAZY (*)    Glucose, UA >=500 (*)    Ketones, ur 5 (*)    Protein, ur 30 (*)    All other components within normal limits  CBC  CBG MONITORING, ED    EKG None  Radiology No results found.  Procedures Procedures   Medications Ordered in ED Medications - No data to display  ED Course  I have reviewed the triage vital signs and the nursing notes.  Pertinent labs & imaging results that were available during my care of the patient were reviewed by me and considered in my medical decision making (see chart for details).  Clinical Course as of 11/28/20 1804  Wed Nov 28, 2020  1702 Creatinine(!): 1.70 [AH]    Clinical Course User Index [AH] Margarita Mail, PA-C   MDM Rules/Calculators/A&P                          Patient here with complaint of photosensitivity.  He had some recall issues but this does not sound concerning for altered mental status, difficulty with speech. I have discontinued the patient's MRI.  I reviewed a head CT which shows no acute abnormalities.  Patient's symptoms are not consistent with TIA, CVA, ICH, altered mental status.  Patient has no eye pain and I have no concern for acute angle-closure glaucoma. Patient is alert, oriented.  His vision is baseline with his reading glasses and grossly intact.  He has a normal funduscopic eye exam. Patient also has a normal neurologic exam. I discussed findings with the patient and his wife at bedside who both feel reassured at this point.  Patient will be discharged.  We discussed his elevated creatinine and need for close follow-up.  Patient appears otherwise appropriate for discharge at this time. Final Clinical Impression(s) / ED Diagnoses Final diagnoses:  None    Rx / DC Orders ED Discharge Orders     None        Margarita Mail, PA-C 11/28/20 1806    Truddie Hidden, MD 11/28/20 2220

## 2020-11-28 NOTE — ED Provider Notes (Signed)
Emergency Medicine Provider Triage Evaluation Note  [name] , a 70 y.o. male  was evaluated in triage.  Pt complains of vision changes x1 week.  Patient was recently started on Eliquis.  States that he has been having intermittent flashes of "light "brightness", these episodes last about 15 to 20 minutes and resolve.  He is having multiple episodes per day.  Family member states that he is also having increasing confusion, has history of stroke and TIAs in the past.  No symptoms in triage.  Review of Systems  Positive: Confusion, vision changes Negative: Headache, lightheadedness, numbness, tingling, weakness  Physical Exam  BP 104/61 (BP Location: Left Arm)   Pulse 66   Temp 98.1 F (36.7 C) (Oral)   Resp 18   SpO2 99%  Gen:   Awake, no distress   Resp:  Normal effort  MSK:   Moves extremities without difficulty  Other:  Neuro exam grossly normal, PERRLA, EOMI intact  Medical Decision Making  Medically screening exam initiated at 12:56 PM.  Appropriate orders placed.  Barnett Hatter was informed that the remainder of the evaluation will be completed by another provider, this initial triage assessment does not replace that evaluation, and the importance of remaining in the ED until their evaluation is complete.  Concern for altered mental status and history of TIAs, will obtain MRI brain   Kateri Plummer, PA-C 11/28/20 1258    Pattricia Boss, MD 11/29/20 1624

## 2020-11-28 NOTE — ED Triage Notes (Addendum)
Per patient and family member, pt was started on eliquis last week. Patient states that everyday he has a "flash of light or brightness" that occurs after taking his med. It lasts a few minutes and then resolves. Family states that he is also having increase in confusion, hx of TIAs in past. Pt is answering questions appropriately at triage and no neuro deficits noted. Currently under treatment for lymphoma.

## 2020-11-29 ENCOUNTER — Inpatient Hospital Stay: Payer: Medicare HMO

## 2020-11-29 ENCOUNTER — Telehealth: Payer: Self-pay

## 2020-11-29 ENCOUNTER — Other Ambulatory Visit: Payer: Self-pay

## 2020-11-29 DIAGNOSIS — Z8249 Family history of ischemic heart disease and other diseases of the circulatory system: Secondary | ICD-10-CM | POA: Diagnosis not present

## 2020-11-29 DIAGNOSIS — Z833 Family history of diabetes mellitus: Secondary | ICD-10-CM | POA: Diagnosis not present

## 2020-11-29 DIAGNOSIS — I428 Other cardiomyopathies: Secondary | ICD-10-CM | POA: Diagnosis not present

## 2020-11-29 DIAGNOSIS — Z79899 Other long term (current) drug therapy: Secondary | ICD-10-CM | POA: Diagnosis not present

## 2020-11-29 DIAGNOSIS — R972 Elevated prostate specific antigen [PSA]: Secondary | ICD-10-CM | POA: Diagnosis not present

## 2020-11-29 DIAGNOSIS — Z832 Family history of diseases of the blood and blood-forming organs and certain disorders involving the immune mechanism: Secondary | ICD-10-CM | POA: Diagnosis not present

## 2020-11-29 DIAGNOSIS — C8319 Mantle cell lymphoma, extranodal and solid organ sites: Secondary | ICD-10-CM

## 2020-11-29 DIAGNOSIS — Z8042 Family history of malignant neoplasm of prostate: Secondary | ICD-10-CM | POA: Diagnosis not present

## 2020-11-29 DIAGNOSIS — C8315 Mantle cell lymphoma, lymph nodes of inguinal region and lower limb: Secondary | ICD-10-CM | POA: Diagnosis not present

## 2020-11-29 DIAGNOSIS — E785 Hyperlipidemia, unspecified: Secondary | ICD-10-CM | POA: Diagnosis not present

## 2020-11-29 DIAGNOSIS — Z841 Family history of disorders of kidney and ureter: Secondary | ICD-10-CM | POA: Diagnosis not present

## 2020-11-29 MED ORDER — TIXAGEVIMAB (PART OF EVUSHELD) INJECTION
300.0000 mg | Freq: Once | INTRAMUSCULAR | Status: AC
  Administered 2020-11-29: 150 mg via INTRAMUSCULAR
  Filled 2020-11-29: qty 3

## 2020-11-29 MED ORDER — CILGAVIMAB (PART OF EVUSHELD) INJECTION
300.0000 mg | Freq: Once | INTRAMUSCULAR | Status: AC
  Administered 2020-11-29: 150 mg via INTRAMUSCULAR
  Filled 2020-11-29: qty 3

## 2020-11-29 NOTE — Telephone Encounter (Signed)
**Note De-Identified  Obfuscation** The pts BMSPAF application for Eliquis was left at the office with a request to complete the providers page, have Dr Quentin Ore sign, date it, and to then mail it back to the pt in the self addressed envelope that was left with the application.  I have competed the providers page and have emailed all to Dr Claudie Revering nurse so she can place in the envelope provided by the pt and to mail to the back to him as requested.

## 2020-11-29 NOTE — Patient Instructions (Signed)
Tixagevimab; Cilgavimab Solutions for Injection What is this medication? TIXAGEVIMAB; CILGAVIMAB (tix a jev i mab; sil gav i mab) reduces the risk of getting COVID-19 in persons with immune system problems who may not respond properly to the COVID-19 vaccine or persons who can't receive the COVID-19 vaccine. It belongs to a group of medications called monoclonal antibodies. It may decrease the risk of developing severe symptoms of COVID-19. It may also decrease the chance of going to the hospital. This medication is not approved by the FDA. The FDA has authorized emergency use of this medication during the COVID-19 pandemic. This medicine may be used for other purposes; ask your health care provider or pharmacist if you have questions. COMMON BRAND NAME(S): EVUSHELD What should I tell my care team before I take this medication? They need to know if you have any of these conditions: any allergies any serious illness bleeding disorder have received a COVID-19 vaccine heart disease an unusual or allergic reaction to tixagevimab, cilgavimab, other medications, foods, dyes, or preservatives pregnant or trying to get pregnant breast-feeding How should I use this medication? This medication is injected into a muscle. It is given by your care team in a hospital or clinic setting. Talk to your care team about the use of this medication in children. While it may be given to children as young as 12 years, precautions do apply. Overdosage: If you think you have taken too much of this medicine contact a poison control center or emergency room at once. NOTE: This medicine is only for you. Do not share this medicine with others. What if I miss a dose? Keep appointments for follow-up doses. It is important not to miss your dose. Call your care team if you are unable to keep an appointment. What may interact with this medication? COVID-19 vaccines This list may not describe all possible interactions. Give  your health care provider a list of all the medicines, herbs, non-prescription drugs, or dietary supplements you use. Also tell them if you smoke, drink alcohol, or use illegal drugs. Some items may interact with your medicine. What should I watch for while using this medication? Your condition will be monitored carefully while you are receiving this medication. Visit your care team for regular checks on your progress. Tell your care team if your symptoms do not start to get better or if they get worse. After receiving this medication, wait at least 14 days before getting the COVID-19 vaccine. What side effects may I notice from receiving this medication? Side effects that you should report to your care team as soon as possible: Allergic reactions-skin rash, itching, hives, swelling of the face, lips, tongue, or throat Heart attack-pain or tightness in the chest, shoulders, arms, or jaw, nausea, shortness of breath, cold or clammy skin, feeling faint or lightheaded Heart failure-shortness of breath, swelling of the ankles, feet, or hands, sudden weight gain, unusual weakness or fatigue Heart rhythm changes-fast or irregular heartbeat, dizziness, feeling faint or lightheaded, chest pain, trouble breathing Side effects that usually do not require medical attention (report these to your care team if they continue or are bothersome): Cough Fatigue Headache Pain, redness, or irritation at site where injected This list may not describe all possible side effects. Call your doctor for medical advice about side effects. You may report side effects to FDA at 1-800-FDA-1088. Where should I keep my medication? This medication is given in a hospital or clinic. It will not be stored at home. NOTE: This sheet is  a summary. It may not cover all possible information. If you have questions about this medicine, talk to your doctor, pharmacist, or health care provider.  2022 Elsevier/Gold Standard (2020-01-12  16:22:16)

## 2020-11-29 NOTE — Telephone Encounter (Signed)
Application signed and will MAIL back to Pt per family request.  Unable to find envelope provided to return documentation.

## 2020-11-30 ENCOUNTER — Telehealth: Payer: Self-pay

## 2020-11-30 ENCOUNTER — Other Ambulatory Visit (HOSPITAL_COMMUNITY): Payer: Self-pay

## 2020-11-30 NOTE — Telephone Encounter (Signed)
Spoke with pt and pt's wife regarding application for Eliquis. Pt was informed that his paperwork was mailed out in the pre stamped envelope that his wife provided, as well as a copy mailed out to him. Pt wife stated pt was recently in the hospital and is feeling much better. Pt questions and concerns were address.

## 2020-12-25 ENCOUNTER — Other Ambulatory Visit (HOSPITAL_COMMUNITY): Payer: Self-pay

## 2020-12-28 ENCOUNTER — Other Ambulatory Visit (HOSPITAL_COMMUNITY): Payer: Self-pay

## 2021-01-09 ENCOUNTER — Ambulatory Visit (HOSPITAL_COMMUNITY)
Admission: RE | Admit: 2021-01-09 | Discharge: 2021-01-09 | Disposition: A | Discharge status: Home / Self Care | Payer: Medicare HMO | Source: Ambulatory Visit | Attending: Cardiology | Admitting: Cardiology

## 2021-01-09 ENCOUNTER — Encounter (HOSPITAL_COMMUNITY): Payer: Self-pay | Admitting: Cardiology

## 2021-01-09 ENCOUNTER — Other Ambulatory Visit: Payer: Self-pay

## 2021-01-09 VITALS — BP 130/80 | HR 65 | Wt 193.0 lb

## 2021-01-09 DIAGNOSIS — Z8673 Personal history of transient ischemic attack (TIA), and cerebral infarction without residual deficits: Secondary | ICD-10-CM | POA: Diagnosis not present

## 2021-01-09 DIAGNOSIS — E785 Hyperlipidemia, unspecified: Secondary | ICD-10-CM | POA: Insufficient documentation

## 2021-01-09 DIAGNOSIS — Z7901 Long term (current) use of anticoagulants: Secondary | ICD-10-CM | POA: Insufficient documentation

## 2021-01-09 DIAGNOSIS — I480 Paroxysmal atrial fibrillation: Secondary | ICD-10-CM | POA: Diagnosis not present

## 2021-01-09 DIAGNOSIS — I2510 Atherosclerotic heart disease of native coronary artery without angina pectoris: Secondary | ICD-10-CM | POA: Insufficient documentation

## 2021-01-09 DIAGNOSIS — Z9221 Personal history of antineoplastic chemotherapy: Secondary | ICD-10-CM | POA: Insufficient documentation

## 2021-01-09 DIAGNOSIS — I5022 Chronic systolic (congestive) heart failure: Secondary | ICD-10-CM | POA: Insufficient documentation

## 2021-01-09 DIAGNOSIS — Z79899 Other long term (current) drug therapy: Secondary | ICD-10-CM | POA: Insufficient documentation

## 2021-01-09 DIAGNOSIS — Z8572 Personal history of non-Hodgkin lymphomas: Secondary | ICD-10-CM | POA: Diagnosis not present

## 2021-01-09 DIAGNOSIS — I428 Other cardiomyopathies: Secondary | ICD-10-CM | POA: Insufficient documentation

## 2021-01-09 DIAGNOSIS — Z8249 Family history of ischemic heart disease and other diseases of the circulatory system: Secondary | ICD-10-CM | POA: Diagnosis not present

## 2021-01-09 LAB — BASIC METABOLIC PANEL
Anion gap: 6 (ref 5–15)
BUN: 16 mg/dL (ref 8–23)
CO2: 26 mmol/L (ref 22–32)
Calcium: 8.9 mg/dL (ref 8.9–10.3)
Chloride: 108 mmol/L (ref 98–111)
Creatinine, Ser: 1.13 mg/dL (ref 0.61–1.24)
GFR, Estimated: >60 mL/min (ref >60)
Glucose, Bld: 81 mg/dL (ref 70–99)
Potassium: 4.3 mmol/L (ref 3.5–5.1)
Sodium: 140 mmol/L (ref 135–145)

## 2021-01-09 LAB — LIPID PANEL
Cholesterol: 123 mg/dL (ref 0–200)
HDL: 46 mg/dL (ref >40)
LDL Cholesterol: 61 mg/dL (ref 0–99)
Total CHOL/HDL Ratio: 2.7 RATIO
Triglycerides: 80 mg/dL (ref <150)
VLDL: 16 mg/dL (ref 0–40)

## 2021-01-09 LAB — BRAIN NATRIURETIC PEPTIDE: B Natriuretic Peptide: 69.4 pg/mL (ref 0.0–100.0)

## 2021-01-09 MED ORDER — CARVEDILOL 12.5 MG PO TABS: 18.7500 mg | ORAL_TABLET | Freq: Two times a day (BID) | ORAL | 3 refills | Status: DC

## 2021-01-09 NOTE — Progress Notes (Addendum)
PCP: Shelda Pal, DO Cardiology: Dr. Debara Pickett HF Cardiology: Dr. Aundra Dubin  70 y.o. with history of mantle cell lymphoma, CVA, PACs, and chronic systolic CHF was referred by Dr. Debara Pickett for followup of CHF.  Patient was diagnosed with mantle cell lymphoma in 2017 and started CHOP-R in 6/17.  He is currently on ibrutinib with good control of lymphoma.  In 6/21, he was admitted with CVA.  Imaging showed acute right pontine infarct but also chronic right mid frontal lobe infarct and superior left cerebellar infarct.  Carotid dopplers showed moderate LICA stenosis, but carotid stenosis was not thought to be the etiology of CVAs.  He worse a Zio patch for 2 wks, showing frequent PACs but no AF.   During the 6/21 hospitalization, echo showed EF 35-40%.  Coronary CT angiogram in 8/21 showed mild nonobstructive disease.  Most recent echo in 5/22 showed further deterioration in LV function with EF 25-30%, moderate LV dilation, and normal RV.    Cardiac MRI in 9/22 showed LV EF 43%, RV EF 48%, small areas of mid-wall LGE in the basal inferior and basal inferoseptal walls.   Atrial flutter has been noted, he is now on Eliquis.   Patient returns for followup of CHF.  He has been doing well.  Lifts weights, denies significant exertional dyspnea.  No chest pain.  No orthopnea/PND. No palpitations.  Weight up 3 lbs.    Labs (7/22): LDL 55, K 4.1, creatinine 1.17, LDL 55 Labs (8/22): K 4.9, creatinine 1.3 Labs (10/22): hgb 14, K 3.8, creatinine 1.7  ECG (personally reviewed): sinus brady at 51, anterolateral TWIs  PMH: 1. Non-hodgkin's lymphoma: Mantle cell lymphoma.  He was treated with CHOP-R starting 6/17.  Currently on ibrutinib.  2. Carotid stenosis: Carotid dopplers (5/68) with 12-75% LICA stenosis.  - Carotid dopplers (1/70): 01-74% LICA.  3. CVA (6/21): Acute right pontine infarct but also with chronic right mid frontal lobe infarct and superior left cerebellar infarct.  Carotid stenosis was not  thought to be the etiology.   - 2 wk Zio patch in 7/21 showed frequent PACs but no atrial fibrillation.  4. PACs: Frequent.  5. 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  - Echo (6/21): EF 35-40% - Coronary CT angiography (8/21): mild nonobstructive CAD, ascending aorta 4.3 cm - Echo (5/22): EF 25-30%, moderate LV dilation, mild AI, normal RV.  - cMRI (9/22): LV EF 43%, RV EF 48%, small areas of mid-wall LGE in the basal inferior and basal inferoseptal walls. 6. 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  7. Duodenal ulcer with GI bleeding 8. Atrial flutter: Paroxysmal.   Social History   Socioeconomic History   Marital status: Married    Spouse name: Not on file   Number of children: Not on file   Years of education: Not on file   Highest education level: Not on file  Occupational History   Occupation: Retired  Tobacco Use   Smoking status: Never   Smokeless tobacco: Never  Vaping Use   Vaping Use: Never used  Substance and Sexual Activity   Alcohol use: Yes    Comment: occasional beer   Drug use: No   Sexual activity: Not on file  Other Topics Concern   Not on file  Social History Narrative   Not on file   Social Determinants of Health   Financial Resource Strain: Not on file  Food Insecurity: Not on file  Transportation Needs: Not on file  Physical Activity: Not  on file  Stress: Not on file  Social Connections: Not on file  Intimate Partner Violence: Not on file   Family History  Problem Relation Age of Onset   Hypertension Mother    Hypertension Father    Kidney failure Father    Hypertension Sister    Diabetes Sister    Prostate cancer Brother    Lupus Sister    ROS: All systems reviewed and negative except as per HPI.  Current Outpatient Medications  Medication Sig Dispense Refill   apixaban (ELIQUIS) 5 MG TABS tablet Take 1 tablet (5 mg total) by mouth 2 (two) times daily. 60 tablet 11   atorvastatin (LIPITOR) 80 MG  tablet TAKE 1 TABLET BY MOUTH EVERY DAY 90 tablet 1   empagliflozin (JARDIANCE) 10 MG TABS tablet Take 1 tablet (10 mg total) by mouth daily before breakfast. 30 tablet 11   ibrutinib (IMBRUVICA) 420 MG TABS TAKE 1 TABLET BY MOUTH DAILY. 28 tablet 6   sacubitril-valsartan (ENTRESTO) 97-103 MG Take 1 tablet by mouth 2 (two) times daily. 60 tablet 11   spironolactone (ALDACTONE) 25 MG tablet Take 1 tablet (25 mg total) by mouth daily. 90 tablet 3   carvedilol (COREG) 12.5 MG tablet Take 1.5 tablets (18.75 mg total) by mouth 2 (two) times daily with a meal. 240 tablet 3   No current facility-administered medications for this encounter.   BP 130/80   Pulse 65   Wt 87.5 kg (193 lb)   SpO2 97%   BMI 26.18 kg/m  General: NAD Neck: No JVD, no thyromegaly or thyroid nodule.  Lungs: Clear to auscultation bilaterally with normal respiratory effort. CV: Nondisplaced PMI.  Heart regular S1/S2, no S3/S4, no murmur.  No peripheral edema.  No carotid bruit.  Normal pedal pulses.  Abdomen: Soft, nontender, no hepatosplenomegaly, no distention.  Skin: Intact without lesions or rashes.  Neurologic: Alert and oriented x 3.  Psych: Normal affect. Extremities: No clubbing or cyanosis.  HEENT: Normal.   Assessment/Plan: 1. Chronic systolic CHF: Nonischemic cardiomyopathy.  Coronary CTA in 8/21 with mild nonobstructive CAD.  Most recent echo in 5/22 with EF 25-30%, moderate LV dilation, normal RV.  I suspect the most likely cause of his cardiomyopathy is prior doxorubicin chemotherapy with initial treatment of his mantle cell lymphoma.  However, there have also been multiple family members with CHF, apparently of uncertain etiology so cannot rule out a role for genetic cardiomyopathy.  Cardiac MRI in 9/22 showed LV EF 43%, RV EF 48%, small areas of mid-wall LGE in the basal inferior and basal inferoseptal walls => cannot rule out myocarditis as cause of cardiomyopathy.  He is not volume overloaded, NYHA class I  symptoms.   - Increase Coreg to 18.75 mg bid.  - Continue spironolactone 25 mg daily. BMET/BNP today.  - Continue empagliflozin 10 mg daily.  - Continue Entresto 97/103 bid.  - Repeat echo after he is on optimal GDMT, will do in 3 months.  Narrow QRS, not CRT candidate.  Cardiac MRI showed EF 43%, out of range for ICD.  2. CVA: Patient had acute CVA in 6/21, imaging also showed evidence for prior CVAs as well.  He had moderate LICA stenosis but this was thought to be a bystander by neurology.  CVA likely related to atrial fibrillation.  - Continue Eliquis.  3. Carotid stenosis: Moderate LICA stenosis, repeat dopplers in 8/23.  4. Hyperlipidemia: Goal LDL < 70.  - Continue atorvastatin, good lipids in 7/22.  5. Atrial  fibrillation: Paroxysmal.   - Continue apixaban.   Followup + echo in 3 months.   Loralie Champagne 01/09/2021

## 2021-01-09 NOTE — Addendum Note (Signed)
Encounter addended by: Larey Dresser, MD on: 01/09/2021 10:50 PM  Actions taken: Clinical Note Signed

## 2021-01-09 NOTE — Patient Instructions (Signed)
INCREASE Coreg to 18.75 mg, (one and one half tab) twice a day   Labs today We will only contact you if something comes back abnormal or we need to make some changes. Otherwise no news is good news!  Your physician recommends that you schedule a follow-up appointment in: 3 months with Dr Aundra Dubin and echo  Your physician has requested that you have an echocardiogram. Echocardiography is a painless test that uses sound waves to create images of your heart. It provides your doctor with information about the size and shape of your heart and how well your heart's chambers and valves are working. This procedure takes approximately one hour. There are no restrictions for this procedure.  Do the following things EVERYDAY: Weigh yourself in the morning before breakfast. Write it down and keep it in a log. Take your medicines as prescribed Eat low salt foods--Limit salt (sodium) to 2000 mg per day.  Stay as active as you can everyday Limit all fluids for the day to less than 2 liters  At the [facility], you and your health needs are our priority. As part of our continuing mission to provide you with exceptional heart care, we have created designated Provider Care Teams. These Care Teams include your primary Cardiologist (physician) and Advanced Practice Providers (APPs- Physician Assistants and Nurse Practitioners) who all work together to provide you with the care you need, when you need it.   You may see any of the following providers on your designated Care Team at your next follow up: Dr Glori Bickers Dr Haynes Kerns, NP Lyda Jester, [place] [facility] [place], [place] Audry Riles, PharmD   Please be sure to bring in all your medications bottles to every appointment.

## 2021-01-16 ENCOUNTER — Telehealth (HOSPITAL_COMMUNITY): Payer: Self-pay

## 2021-01-16 NOTE — Telephone Encounter (Signed)
Received a fax requesting medical clearance from [facility]. Records were successfully faxed to: [phone] ,which was the number provided.. Medical request form will be scanned into patients chart.

## 2021-01-18 NOTE — Telephone Encounter (Signed)
Received fax from [facility] stating that pt is not eligible for Eliquis Pt Asst because there is no documentation of 3% out of pocket expense.

## 2021-01-23 ENCOUNTER — Other Ambulatory Visit (HOSPITAL_COMMUNITY): Payer: Self-pay

## 2021-01-23 ENCOUNTER — Other Ambulatory Visit: Payer: Self-pay | Admitting: Oncology

## 2021-01-23 MED ORDER — IMBRUVICA 420 MG PO TABS
420.0000 mg | ORAL_TABLET | Freq: Every day | ORAL | 6 refills | Status: DC
  Filled 2021-01-23: qty 28, 28d supply, fill #0
  Filled 2021-02-19: qty 28, 28d supply, fill #1
  Filled 2021-03-18: qty 28, 28d supply, fill #2
  Filled 2021-04-12: qty 28, 28d supply, fill #3
  Filled 2021-05-07: qty 28, 28d supply, fill #4
  Filled 2021-06-06: qty 28, 28d supply, fill #5
  Filled 2021-07-04: qty 28, 28d supply, fill #6

## 2021-01-24 ENCOUNTER — Other Ambulatory Visit (HOSPITAL_COMMUNITY): Payer: Self-pay

## 2021-02-11 ENCOUNTER — Encounter: Payer: Self-pay | Admitting: Oncology

## 2021-02-18 ENCOUNTER — Other Ambulatory Visit: Payer: Self-pay

## 2021-02-18 ENCOUNTER — Ambulatory Visit: Payer: Medicare PPO | Admitting: Internal Medicine

## 2021-02-18 ENCOUNTER — Encounter: Payer: Self-pay | Admitting: Internal Medicine

## 2021-02-18 VITALS — BP 130/73 | HR 55 | Ht 70.0 in | Wt 194.6 lb

## 2021-02-18 DIAGNOSIS — I639 Cerebral infarction, unspecified: Secondary | ICD-10-CM

## 2021-02-18 DIAGNOSIS — I428 Other cardiomyopathies: Secondary | ICD-10-CM | POA: Diagnosis not present

## 2021-02-18 DIAGNOSIS — I6529 Occlusion and stenosis of unspecified carotid artery: Secondary | ICD-10-CM

## 2021-02-18 DIAGNOSIS — I480 Paroxysmal atrial fibrillation: Secondary | ICD-10-CM

## 2021-02-18 DIAGNOSIS — I5022 Chronic systolic (congestive) heart failure: Secondary | ICD-10-CM

## 2021-02-18 NOTE — Progress Notes (Signed)
OFFICE NOTE  Chief Complaint:  Follow-up heart failure  Primary Care Physician: Shelda Pal, DO  HPI:  [name] is a 71 y.o. male with a past medial history significant for non-Hodgkin's lymphoma who presented to the emergency room 08/01/2019 with balance issues.  Patient said he had done his usual workout and was doing some work around the house when he noticed his balance was "off".  He also developed some left arm pain.  He had not had this before.  He went to the emergency room.  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  Carotid Dopplers revealed a 1 to 39% right internal carotid artery narrowing and a 60 to 79% left internal carotid artery narrowing.  Echocardiogram revealed cardiomyopathy with an EF of 35 to 40% with global hypokinesis.  He also had grade 1 diastolic dysfunction, moderate left atrial enlargement.  He was noted to have frequent ectopy with PACs and PVCs.  The hospital notes and discharge summary mention atrial fibrillation, this was not mentioned by Dr. Debara Pickett and I could find no evidence of atrial fibrillation by his EKGs.  He did have frequent ectopy.  The patient was placed on aspirin and Plavix although today he tells me he has not been taking aspirin.  His Mevacor was changed to Lipitor 80 mg. His lisinopril HCTZ was changed to Entresto.  Dr. Lysbeth Penner note also mentions adding low-dose carvedilol but this was not added at discharge.  The plan is for outpatient ischemic evaluation with a coronary CT scan.  Since discharge the patient says he is done well.  The patient is an avid exerciser and goes to the gym.  He used to be a runner but now just walks daily for exercise.  I asked him to back off on his weight lifting and push-ups.  I suggested he could do low weights, 10 pounds or so with high reps.  I suggested he continue walking for exercise as tolerated.  He and his wife have gone on a no sodium diet. He denies any LE edema  or orthopnea.   09/29/2019  [name] is seen today in follow-up.  He was seen by Kerin Ransom, PA-C for hospital follow-up.  I saw him first in the hospital.  We were trying to work-up his cardiomyopathy as to whether it was ischemic or nonischemic.  Because of some abnormal beats and other features I recommended outpatient stress testing or cardiac CT.  In addition [name] had some orthostatic symptoms and apparently was taken off of Entresto and placed on a beta-blocker.  Those seem to have resolved and vitals however did not indicate orthostasis.  CT coronary angiogram was performed on 09/09/2019 showed a calcium score 145 which was 78th percentile for age and sex matched control.  There was some mild plaque in the RCA minimal plaque in the LAD and circumflex.  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  The aortic valve was also mildly calcified.  I reviewed those findings with him today.  Overall he seems to be feeling well and I would describe his cardiomyopathy as nonischemic.  Blood pressure is somewhat elevated today 156/92 and seems to be top normal to mildly elevated at home as well.  01/02/2020  [name] is seen today in follow-up.  Overall he is doing well.  He seems to be tolerating medication changes.  Blood pressure is very good today 132/80.  He denies any chest  pain or worsening shortness of breath.  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  This is being followed expectantly.  He is currently on some antibiotics.  He did have a lipid profile 2 months ago which showed total cholesterol 124, HDL 41, LDL 67 and triglycerides 84.  07/04/2020  [name] returns today for follow-up.  He recently underwent a repeat echo in hopes that his heart function would have improved on Entresto however unfortunately the echo shows some further decline in his EF.  Weight is come down a good bit in fact he is down about 10 pounds.  He says he exercises daily including aerobic  activity and weightlifting.  He endorses no more than NYHA class I symptoms.  His wife says that he does seem a little more fatigued at times and that might be due to the fact that he has worsening cardiomyopathy.  His repeat echo unfortunately showed a decline in LVEF to 25 to 30% from 40% in 2001.  He is on immunotherapy for mantle cell non-Hodgkin's lymphoma.  I am not sure certain whether this could be contributing to it or not.  02/18/2021  [name] returns today for follow-up.  He seems to be doing well.  I had referred him to the heart failure clinic and has been followed by Dr. Aundra Taylor who did a cardiac MRI and is optimized his medications.  Most recent LVEF is now up to 43%.  He is scheduled for repeat echo in about 3 months.  Unfortunately he has had stroke in the past and was found to have moderate to severe left internal carotid artery stenosis, not thought to be the cause of his stroke rather was possibly A. fib.  Because of paroxysmal A. fib he has been seen by Dr. Quentin Ore and recommended to be on Eliquis 5 mg twice daily.  This has been an issue because of cost.  He had received support from the company but this requires him to pay at least a certain amount to meet a deductible before it is covered.  As it is January he has not met his deductible.  PMHx:  Past Medical History:  Diagnosis Date   Anemia    Cancer ([place])    Lymphoma   Essential hypertension    GERD (gastroesophageal reflux disease)    GI bleed    HLD (hyperlipidemia)    Weakness     Past Surgical History:  Procedure Laterality Date   ESOPHAGOGASTRODUODENOSCOPY Left 01/18/2015   Procedure: ESOPHAGOGASTRODUODENOSCOPY (EGD);  Surgeon: Wilford Corner, MD;  Location[facility] ENDOSCOPY;  Service: Endoscopy;  Laterality: Left;   INGUINAL HERNIA REPAIR Right 01/22/2015   Procedure: RIGHT INGUINAL LYMPH NODE BX;  Surgeon: Donnie Mesa, MD;  Location: [facility];  Service: General;  Laterality: Right;   MEDIASTINOSCOPY N/A 05/05/2017    Procedure: MEDIASTINOSCOPY;  Surgeon: Grace Isaac, MD;  Location: [facility];  Service: Thoracic;  Laterality: N/A;   PORTACATH PLACEMENT  01/26/2015    power port with tip SVC/RA [place]   SKIN SURGERY     Small benign cysts over left scalp removed   VIDEO BRONCHOSCOPY WITH ENDOBRONCHIAL ULTRASOUND N/A 05/05/2017   Procedure: VIDEO BRONCHOSCOPY WITH ENDOBRONCHIAL ULTRASOUND;  Surgeon: Grace Isaac, MD;  Location: [facility] OR;  Service: Thoracic;  Laterality: N/A;    FAMHx:  Family History  Problem Relation Age of Onset   Hypertension Mother    Hypertension Father    Kidney failure Father  Hypertension Sister    Diabetes Sister    Prostate cancer Brother    Lupus Sister     SOCHx:   reports that he has never smoked. He has never used smokeless tobacco. He reports current alcohol use. He reports that he does not use drugs.  ALLERGIES:  No Known Allergies  ROS: Pertinent items noted in HPI and remainder of comprehensive ROS otherwise negative.  HOME MEDS: Current Outpatient Medications on File Prior to Visit  Medication Sig Dispense Refill   apixaban (ELIQUIS) 5 MG TABS tablet Take 1 tablet (5 mg total) by mouth 2 (two) times daily. 60 tablet 11   atorvastatin (LIPITOR) 80 MG tablet TAKE 1 TABLET BY MOUTH EVERY DAY 90 tablet 1   carvedilol (COREG) 12.5 MG tablet Take 1.5 tablets (18.75 mg total) by mouth 2 (two) times daily with a meal. 240 tablet 3   empagliflozin (JARDIANCE) 10 MG TABS tablet Take 1 tablet (10 mg total) by mouth daily before breakfast. 30 tablet 11   ibrutinib (IMBRUVICA) 420 MG TABS TAKE 1 TABLET BY MOUTH DAILY. 28 tablet 6   ibrutinib (IMBRUVICA) 420 MG TABS Take 1 tablet (420 mg) by mouth daily. 28 tablet 6   sacubitril-valsartan (ENTRESTO) 97-103 MG Take 1 tablet by mouth 2 (two) times daily. 60 tablet 11   spironolactone (ALDACTONE) 25 MG tablet Take 1 tablet (25 mg total) by mouth daily. 90 tablet 3   No current facility-administered medications  on file prior to visit.    LABS/IMAGING: No results found for this or any previous visit (from the past 48 hour(s)). No results found.  LIPID PANEL:    Component Value Date/Time   CHOL 123 01/09/2021 1116   CHOL 124 10/21/2019 0904   TRIG 80 01/09/2021 1116   HDL 46 01/09/2021 1116   HDL 41 10/21/2019 0904   CHOLHDL 2.7 01/09/2021 1116   VLDL 16 01/09/2021 1116   LDLCALC 61 01/09/2021 1116   LDLCALC 67 10/21/2019 0904     WEIGHTS: Wt Readings from Last 3 Encounters:  02/18/21 194 lb 9.6 oz (88.3 kg)  01/09/21 193 lb (87.5 kg)  11/19/20 188 lb (85.3 kg)    VITALS: BP 130/73    Pulse (!) 55    Ht $R'5\' 10"'Eh$  (1.778 m)    Wt 194 lb 9.6 oz (88.3 kg)    SpO2 97%    BMI 27.92 kg/m   EXAM: General appearance: alert and no distress Neck: no carotid bruit, no JVD and thyroid not enlarged, symmetric, no tenderness/mass/nodules Lungs: clear to auscultation bilaterally Heart: regular rate and rhythm, S1, S2 normal, no murmur, click, rub or gallop Abdomen: soft, non-tender; bowel sounds normal; no masses,  no organomegaly Extremities: extremities normal, atraumatic, no cyanosis or edema Pulses: 2+ and symmetric Skin: Skin color, texture, turgor normal. 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   Mr. Pare seems to be optimized on his current medicines from heart failure standpoint.  He has a repeat echo coming up in March.  Last LVEF was 43% by cardiac MRI in September.  He has repeat Dopplers of the carotids next  summer to follow-up on  moderate left internal carotid artery stenosis.  He had stroke but was subsequently found to have A. fib and is now anticoagulated on Eliquis.  Cost may be an issue with the medicine.  Was provided with some samples today.  We provided him information about patient assistance.  He will continue to follow with heart failure clinic and can follow-up with me annually or sooner as necessary.  Pixie Casino, MD, [facility] [place] Director of the Advanced Lipid Disorders &  Cardiovascular Risk Reduction Clinic Diplomate of the American Board of Clinical Lipidology Attending Cardiologist  Direct Dial: [phone]   Fax: [phone]  Website:  [email or web address] Jarious Lyon 02/18/2021, 9:30 AM

## 2021-02-18 NOTE — Patient Instructions (Signed)

## 2021-02-19 ENCOUNTER — Other Ambulatory Visit (HOSPITAL_COMMUNITY): Payer: Self-pay

## 2021-02-21 ENCOUNTER — Other Ambulatory Visit (HOSPITAL_COMMUNITY): Payer: Self-pay

## 2021-02-24 ENCOUNTER — Other Ambulatory Visit: Payer: Self-pay | Admitting: Family Medicine

## 2021-03-11 ENCOUNTER — Other Ambulatory Visit: Payer: Self-pay | Admitting: Family Medicine

## 2021-03-12 ENCOUNTER — Telehealth (HOSPITAL_COMMUNITY): Payer: Self-pay | Admitting: Pharmacy Technician

## 2021-03-12 NOTE — Telephone Encounter (Signed)
Advanced Heart Failure Patient Advocate Encounter  Spoke with patient and wife about Eliquis assistance. They are aware of the 3% requirement. Another office looks to manage that medication but will give a month of samples in the meantime.  Reminded the patient that he has an active HF grant with the [employer] foundation that will help pay for [place]. This will help offset paying for Eliquis. The current amount is $555 set to expire 09/2021. Sent a copy of the grant information to him as well.  Advised him to call back with issues.  Charlann Boxer, CPhT

## 2021-03-14 MED ORDER — SACUBITRIL-VALSARTAN 97-103 MG PO TABS: 1.0000 | ORAL_TABLET | Freq: Two times a day (BID) | ORAL | 3 refills | Status: DC

## 2021-03-14 NOTE — Telephone Encounter (Signed)
Faxed entresto PAP to [employer] at [phone]

## 2021-03-14 NOTE — Telephone Encounter (Signed)
Faxed jardiance PAP to [employer] at [phone]

## 2021-03-18 ENCOUNTER — Other Ambulatory Visit (HOSPITAL_COMMUNITY): Payer: Self-pay

## 2021-03-19 ENCOUNTER — Telehealth: Payer: Self-pay

## 2021-03-19 NOTE — Telephone Encounter (Signed)
[employer] PAF approved from 03/18/2021 to 02/02/2022 by Felton Clinton @ 03/19/2021 11:30 AM.

## 2021-03-19 NOTE — Telephone Encounter (Addendum)
[employer] Patient Assistance Program PAP approved from 03/15/2021 to 02/02/2022.

## 2021-03-20 ENCOUNTER — Other Ambulatory Visit (HOSPITAL_COMMUNITY): Payer: Self-pay

## 2021-03-29 ENCOUNTER — Encounter: Payer: Self-pay | Admitting: Oncology

## 2021-04-01 ENCOUNTER — Telehealth: Payer: Self-pay | Admitting: Hematology and Oncology

## 2021-04-01 NOTE — Telephone Encounter (Signed)
R/s due to provider pal, message has been left with pt

## 2021-04-02 ENCOUNTER — Ambulatory Visit (INDEPENDENT_AMBULATORY_CARE_PROVIDER_SITE_OTHER): Payer: Medicare PPO

## 2021-04-02 VITALS — Ht 70.0 in | Wt 194.0 lb

## 2021-04-02 DIAGNOSIS — Z0000 Encounter for general adult medical examination without abnormal findings: Secondary | ICD-10-CM

## 2021-04-02 NOTE — Patient Instructions (Signed)
[name] , Thank you for taking time to complete your Medicare Wellness Visit. I appreciate your ongoing commitment to your health goals. Please review the following plan we discussed and let me know if I can assist you in the future.   Screening recommendations/referrals: Colonoscopy: Due-Declined today. Recommended yearly ophthalmology/optometry visit for glaucoma screening and checkup Recommended yearly dental visit for hygiene and checkup  Vaccinations: Influenza vaccine: Up to date Pneumococcal vaccine: Up to date Tdap vaccine: May obtain vaccine at your local pharmacy. Shingles vaccine: May obtain vaccine at your local pharmacy.  Advanced directives: Please bring a copy of Living Will and/or Healthcare Power of Attorney for your chart.   Conditions/risks identified: See problem list  Next appointment: Follow up in one year for your annual wellness visit.   Preventive Care 69 Years and Older, Male Preventive care refers to lifestyle choices and visits with your health care provider that can promote health and wellness. What does preventive care include? A yearly physical exam. This is also called an annual well check. Dental exams once or twice a year. Routine eye exams. Ask your health care provider how often you should have your eyes checked. Personal lifestyle choices, including: Daily care of your teeth and gums. Regular physical activity. Eating a healthy diet. Avoiding tobacco and drug use. Limiting alcohol use. Practicing safe sex. Taking low doses of aspirin every day. Taking vitamin and mineral supplements as recommended by your health care provider. What happens during an annual well check? The services and screenings done by your health care provider during your annual well check will depend on your age, overall health, lifestyle risk factors, and family history of disease. Counseling  Your health care provider may ask you questions about your: Alcohol  use. Tobacco use. Drug use. Emotional well-being. Home and relationship well-being. Sexual activity. Eating habits. History of falls. Memory and ability to understand (cognition). Work and work Statistician. Screening  You may have the following tests or measurements: Height, weight, and BMI. Blood pressure. Lipid and cholesterol levels. These may be checked every 5 years, or more frequently if you are over 69 years old. Skin check. Lung cancer screening. You may have this screening every year starting at age 69 if you have a 30-pack-year history of smoking and currently smoke or have quit within the past 15 years. Fecal occult blood test (FOBT) of the stool. You may have this test every year starting at age 69. Flexible sigmoidoscopy or colonoscopy. You may have a sigmoidoscopy every 5 years or a colonoscopy every 10 years starting at age 69. Prostate cancer screening. Recommendations will vary depending on your family history and other risks. Hepatitis C blood test. Hepatitis B blood test. Sexually transmitted disease (STD) testing. Diabetes screening. This is done by checking your blood sugar (glucose) after you have not eaten for a while (fasting). You may have this done every 1-3 years. Abdominal aortic aneurysm (AAA) screening. You may need this if you are a current or former smoker. Osteoporosis. You may be screened starting at age 69 if you are at high risk. Talk with your health care provider about your test results, treatment options, and if necessary, the need for more tests. Vaccines  Your health care provider may recommend certain vaccines, such as: Influenza vaccine. This is recommended every year. Tetanus, diphtheria, and acellular pertussis (Tdap, Td) vaccine. You may need a Td booster every 10 years. Zoster vaccine. You may need this after age 56. Pneumococcal 13-valent conjugate (PCV13) vaccine. One  dose is recommended after age 24. Pneumococcal polysaccharide  (PPSV23) vaccine. One dose is recommended after age 54. Talk to your health care provider about which screenings and vaccines you need and how often you need them. This information is not intended to replace advice given to you by your health care provider. Make sure you discuss any questions you have with your health care provider. Document Released: 02/16/2015 Document Revised: 10/10/2015 Document Reviewed: 11/21/2014 Elsevier Interactive Patient Education  2017 [place] Prevention in the Home Falls can cause injuries. They can happen to people of all ages. There are many things you can do to make your home safe and to help prevent falls. What can I do on the outside of my home? Regularly fix the edges of walkways and driveways and fix any cracks. Remove anything that might make you trip as you walk through a door, such as a raised step or threshold. Trim any bushes or trees on the path to your home. Use bright outdoor lighting. Clear any walking paths of anything that might make someone trip, such as rocks or tools. Regularly check to see if handrails are loose or broken. Make sure that both sides of any steps have handrails. Any raised decks and porches should have guardrails on the edges. Have any leaves, snow, or ice cleared regularly. Use sand or salt on walking paths during winter. Clean up any spills in your garage right away. This includes oil or grease spills. What can I do in the bathroom? Use night lights. Install grab bars by the toilet and in the tub and shower. Do not use towel bars as grab bars. Use non-skid mats or decals in the tub or shower. If you need to sit down in the shower, use a plastic, non-slip stool. Keep the floor dry. Clean up any water that spills on the floor as soon as it happens. Remove soap buildup in the tub or shower regularly. Attach bath mats securely with double-sided non-slip rug tape. Do not have throw rugs and other things on the  floor that can make you trip. What can I do in the bedroom? Use night lights. Make sure that you have a light by your bed that is easy to reach. Do not use any sheets or blankets that are too big for your bed. They should not hang down onto the floor. Have a firm chair that has side arms. You can use this for support while you get dressed. Do not have throw rugs and other things on the floor that can make you trip. What can I do in the kitchen? Clean up any spills right away. Avoid walking on wet floors. Keep items that you use a lot in easy-to-reach places. If you need to reach something above you, use a strong step stool that has a grab bar. Keep electrical cords out of the way. Do not use floor polish or wax that makes floors slippery. If you must use wax, use non-skid floor wax. Do not have throw rugs and other things on the floor that can make you trip. What can I do with my stairs? Do not leave any items on the stairs. Make sure that there are handrails on both sides of the stairs and use them. Fix handrails that are broken or loose. Make sure that handrails are as long as the stairways. Check any carpeting to make sure that it is firmly attached to the stairs. Fix any carpet that is loose or worn.  Avoid having throw rugs at the top or bottom of the stairs. If you do have throw rugs, attach them to the floor with carpet tape. Make sure that you have a light switch at the top of the stairs and the bottom of the stairs. If you do not have them, ask someone to add them for you. What else can I do to help prevent falls? Wear shoes that: Do not have high heels. Have rubber bottoms. Are comfortable and fit you well. Are closed at the toe. Do not wear sandals. If you use a stepladder: Make sure that it is fully opened. Do not climb a closed stepladder. Make sure that both sides of the stepladder are locked into place. Ask someone to hold it for you, if possible. Clearly mark and make  sure that you can see: Any grab bars or handrails. First and last steps. Where the edge of each step is. Use tools that help you move around (mobility aids) if they are needed. These include: Canes. Walkers. Scooters. Crutches. Turn on the lights when you go into a dark area. Replace any light bulbs as soon as they burn out. Set up your furniture so you have a clear path. Avoid moving your furniture around. If any of your floors are uneven, fix them. If there are any pets around you, be aware of where they are. Review your medicines with your doctor. Some medicines can make you feel dizzy. This can increase your chance of falling. Ask your doctor what other things that you can do to help prevent falls. This information is not intended to replace advice given to you by your health care provider. Make sure you discuss any questions you have with your health care provider. Document Released: 11/16/2008 Document Revised: 06/28/2015 Document Reviewed: 02/24/2014 Elsevier Interactive Patient Education  2017 [employer].

## 2021-04-02 NOTE — Progress Notes (Signed)
Subjective:   [name] is a 71 y.o. male who presents for an Initial Medicare Annual Wellness Visit.   I connected with [name] today by telephone and verified that I am speaking with the correct person using two identifiers. Location patient: home Location provider: work Persons participating in the virtual visit: patient, Marine scientist.    I discussed the limitations, risks, security and privacy concerns of performing an evaluation and management service by telephone and the availability of in person appointments. I also discussed with the patient that there may be a patient responsible charge related to this service. The patient expressed understanding and verbally consented to this telephonic visit.    Interactive audio and video telecommunications were attempted between this provider and patient, however failed, due to patient having technical difficulties OR patient did not have access to video capability.  We continued and completed visit with audio only.  Some vital signs may be absent or patient reported.   Time Spent with patient on telephone encounter: 20 minutes  Review of Systems     Cardiac Risk Factors include: advanced age (>97men, >26 women);male gender;hypertension;dyslipidemia     Objective:    Today's Vitals   04/02/21 1142  Weight: 194 lb (88 kg)  Height: 5\' 10"  (1.778 m)   Body mass index is 27.84 kg/m.  Advanced Directives 04/02/2021 08/01/2019 08/01/2019 05/05/2017 05/04/2017 03/26/2017 12/06/2015  Does Patient Have a Medical Advance Directive? Yes Yes No No No No No  Type of Paramedic of [place];Living will [place];Living will - - - - -  Does patient want to make changes to medical advance directive? - No - Patient declined - - - - -  Copy of [place] in Chart? No - copy requested No - copy requested - - - - -  Would patient like information on creating a medical advance directive? - No -  Patient declined No - Patient declined No - Patient declined No - Patient declined - -    Current Medications (verified) Outpatient Encounter Medications as of 04/02/2021  Medication Sig   apixaban (ELIQUIS) 5 MG TABS tablet Take 1 tablet (5 mg total) by mouth 2 (two) times daily.   atorvastatin (LIPITOR) 80 MG tablet TAKE 1 TABLET BY MOUTH EVERY DAY   carvedilol (COREG) 12.5 MG tablet Take 1.5 tablets (18.75 mg total) by mouth 2 (two) times daily with a meal.   empagliflozin (JARDIANCE) 10 MG TABS tablet Take 1 tablet (10 mg total) by mouth daily before breakfast.   ibrutinib (IMBRUVICA) 420 MG tablet Take 1 tablet (420 mg) by mouth daily.   sacubitril-valsartan (ENTRESTO) 97-103 MG Take 1 tablet by mouth 2 (two) times daily.   spironolactone (ALDACTONE) 25 MG tablet Take 1 tablet (25 mg total) by mouth daily.   ibrutinib (IMBRUVICA) 420 MG TABS TAKE 1 TABLET BY MOUTH DAILY.   No facility-administered encounter medications on file as of 04/02/2021.    Allergies (verified) Patient has no known allergies.   History: Past Medical History:  Diagnosis Date   Anemia    Cancer ([place])    Lymphoma   Essential hypertension    GERD (gastroesophageal reflux disease)    GI bleed    HLD (hyperlipidemia)    Weakness    Past Surgical History:  Procedure Laterality Date   ESOPHAGOGASTRODUODENOSCOPY Left 01/18/2015   Procedure: ESOPHAGOGASTRODUODENOSCOPY (EGD);  Surgeon: Wilford Corner, MD;  Location: [facility] ENDOSCOPY;  Service: Endoscopy;  Laterality: Left;  INGUINAL HERNIA REPAIR Right 01/22/2015   Procedure: RIGHT INGUINAL LYMPH NODE BX;  Surgeon: Donnie Mesa, MD;  Location: [facility];  Service: General;  Laterality: Right;   MEDIASTINOSCOPY N/A 05/05/2017   Procedure: MEDIASTINOSCOPY;  Surgeon: Grace Isaac, MD;  Location: [place];  Service: Thoracic;  Laterality: N/A;   PORTACATH PLACEMENT  01/26/2015    power port with tip SVC/[place]   SKIN SURGERY     Small benign cysts over left  scalp removed   VIDEO BRONCHOSCOPY WITH ENDOBRONCHIAL ULTRASOUND N/A 05/05/2017   Procedure: VIDEO BRONCHOSCOPY WITH ENDOBRONCHIAL ULTRASOUND;  Surgeon: Grace Isaac, MD;  Location: [place];  Service: Thoracic;  Laterality: N/A;   Family History  Problem Relation Age of Onset   Hypertension Mother    Hypertension Father    Kidney failure Father    Hypertension Sister    Diabetes Sister    Prostate cancer Brother    Lupus Sister    Social History   Socioeconomic History   Marital status: Married    Spouse name: Not on file   Number of children: Not on file   Years of education: Not on file   Highest education level: Not on file  Occupational History   Occupation: Retired  Tobacco Use   Smoking status: Never   Smokeless tobacco: Never  Vaping Use   Vaping Use: Never used  Substance and Sexual Activity   Alcohol use: Not Currently   Drug use: No   Sexual activity: Not on file  Other Topics Concern   Not on file  Social History Narrative   Not on file   Social Determinants of Health   Financial Resource Strain: Low Risk    Difficulty of Paying Living Expenses: Not very hard  Food Insecurity: No Food Insecurity   Worried About Charity fundraiser in the Last Year: Never true   Ran Out of Food in the Last Year: Never true  Transportation Needs: No Transportation Needs   Lack of Transportation (Medical): No   Lack of Transportation (Non-Medical): No  Physical Activity: Sufficiently Active   Days of Exercise per Week: 7 days   Minutes of Exercise per Session: 60 min  Stress: No Stress Concern Present   Feeling of Stress : Not at all  Social Connections: Moderately Isolated   Frequency of Communication with Friends and Family: More than three times a week   Frequency of Social Gatherings with Friends and Family: More than three times a week   Attends Religious Services: Never   Marine scientist or Organizations: No   Attends Music therapist:  Never   Marital Status: Married    Tobacco Counseling Counseling given: Not Answered   Clinical Intake:  Pre-visit preparation completed: Yes  Pain : No/denies pain     BMI - recorded: 27.84 Nutritional Status: BMI 25 -29 Overweight Nutritional Risks: None Diabetes: No  How often do you need to have someone help you when you read instructions, pamphlets, or other written materials from your doctor or pharmacy?: 1 - Never  Diabetic?No  Interpreter Needed?: No  Information entered by :: Caroleen Hamman LPN   Activities of Daily Living In your present state of health, do you have any difficulty performing the following activities: 04/02/2021 08/08/2020  Hearing? N N  Vision? N N  Difficulty concentrating or making decisions? N N  Walking or climbing stairs? N N  Dressing or bathing? N N  Doing errands, shopping? N N  Preparing Food and eating ? N -  Using the Toilet? N -  In the past six months, have you accidently leaked urine? N -  Do you have problems with loss of bowel control? N -  Managing your Medications? N -  Managing your Finances? N -  Housekeeping or managing your Housekeeping? N -  Some recent data might be hidden    Patient Care Team: Shelda Pal, DO as PCP - General (Family Medicine) Debara Pickett Nadean Corwin, MD as PCP - Cardiology (Cardiology) Vickie Epley, MD as PCP - Electrophysiology (Cardiology) Magrinat, Virgie Dad, MD (Inactive) as Consulting Physician (Oncology) Donnie Mesa, MD as Consulting Physician (General Surgery) Wilford Corner, MD as Consulting Physician (Gastroenterology) Ples Specter, MD as Referring Physician (Internal Medicine) Larina Earthly, MD as Referring Physician (Hematology and Oncology) Grace Isaac, MD (Inactive) as Consulting Physician (Cardiothoracic Surgery)  Indicate any recent Medical Services you may have received from other than [facility] providers in the past year (date may be  approximate).     Assessment:   This is a routine wellness examination for [name].  Hearing/Vision screen Hearing Screening - Comments:: No issues Vision Screening - Comments:: Last eye exam-2022-Dr. [place]  Dietary issues and exercise activities discussed: Current Exercise Habits: Home exercise routine, Type of exercise: calisthenics;strength training/weights, Time (Minutes): 60, Frequency (Times/Week): 7, Weekly Exercise (Minutes/Week): 420, Intensity: Mild, Exercise limited by: None identified   Goals Addressed             This Visit's Progress    Patient Stated       Maintain current healthy lifestyle       Depression Screen PHQ 2/9 Scores 04/02/2021 08/08/2020  PHQ - 2 Score 0 0    Fall Risk Fall Risk  04/02/2021  Falls in the past year? 0  Number falls in past yr: 0  Injury with Fall? 0  Follow up Falls prevention discussed    FALL RISK PREVENTION PERTAINING TO THE HOME:  Any stairs in or around the home? No  Home free of loose throw rugs in walkways, pet beds, electrical cords, etc? Yes  Adequate lighting in your home to reduce risk of falls? Yes   ASSISTIVE DEVICES UTILIZED TO PREVENT FALLS:  Life alert? No  Use of a cane, walker or w/c? No  Grab bars in the bathroom? No  Shower chair or bench in shower? No  Elevated toilet seat or a handicapped toilet? No   TIMED UP AND GO:  Was the test performed? No . Phone visit   Cognitive Function:Normal cognitive status assessed by this Nurse Health Advisor. No abnormalities found.          Immunizations Immunization History  Administered Date(s) Administered   Influenza, High Dose Seasonal PF 11/22/2020   Influenza,inj,Quad PF,6+ Mos 01/25/2015   Moderna Sars-Covid-2 Vaccination 03/20/2019, 04/17/2019, 11/24/2019   PNEUMOCOCCAL CONJUGATE-20 08/08/2020    TDAP status: Due, Education has been provided regarding the importance of this vaccine. Advised may receive this vaccine at local pharmacy or Health  Dept. Aware to provide a copy of the vaccination record if obtained from local pharmacy or Health Dept. Verbalized acceptance and understanding.  Flu Vaccine status: Up to date  Pneumococcal vaccine status: Up to date  Covid-19 vaccine status: Information provided on how to obtain vaccines.   Qualifies for Shingles Vaccine? Yes   Zostavax completed No   Shingrix Completed?: No.    Education has been provided regarding the importance of this vaccine. Patient  has been advised to call insurance company to determine out of pocket expense if they have not yet received this vaccine. Advised may also receive vaccine at local pharmacy or Health Dept. Verbalized acceptance and understanding.  Screening Tests Health Maintenance  Topic Date Due   COLONOSCOPY (Pts 45-46yrs Insurance coverage will need to be confirmed)  Never done   COVID-19 Vaccine (4 - Booster for Moderna series) 01/19/2020   Pneumonia Vaccine 82+ Years old  Completed   INFLUENZA VACCINE  Completed   Hepatitis C Screening  Completed   HPV VACCINES  Aged Out   TETANUS/TDAP  Discontinued   Zoster Vaccines- Shingrix  Discontinued    Health Maintenance  Health Maintenance Due  Topic Date Due   COLONOSCOPY (Pts 45-70yrs Insurance coverage will need to be confirmed)  Never done   COVID-19 Vaccine (4 - Booster for Moderna series) 01/19/2020    Colorectal cancer screening: Due-Declned  Lung Cancer Screening: (Low Dose CT Chest recommended if Age 66-80 years, 30 pack-year currently smoking OR have quit w/in 15years.) does not qualify.      Additional Screening:  Hepatitis C Screening: Completed 01/18/2015  Vision Screening: Recommended annual ophthalmology exams for early detection of glaucoma and other disorders of the eye. Is the patient up to date with their annual eye exam?  Yes  Who is the provider or what is the name of the office in which the patient attends annual eye exams? Dr. [place]  Dental Screening: Recommended  annual dental exams for proper oral hygiene  Community Resource Referral / Chronic Care Management: CRR required this visit?  No   CCM required this visit?  No      Plan:     I have personally reviewed and noted the following in the patients chart:   Medical and social history Use of alcohol, tobacco or illicit drugs  Current medications and supplements including opioid prescriptions. Patient is not currently taking opioid prescriptions. Functional ability and status Nutritional status Physical activity Advanced directives List of other physicians Hospitalizations, surgeries, and ER visits in previous 12 months Vitals Screenings to include cognitive, depression, and falls Referrals and appointments  In addition, I have reviewed and discussed with patient certain preventive protocols, quality metrics, and best practice recommendations. A written personalized care plan for preventive services as well as general preventive health recommendations were provided to patient.   Due to this being a telephonic visit, the after visit summary with patients personalized plan was offered to patient via mail or my-chart. Patient would like to access on my-chart.   Marta Antu, LPN   [id number]  Nurse Health Advisor  Nurse Notes: None

## 2021-04-08 NOTE — Progress Notes (Signed)
[place] Telephone:([phone]) [phone]   Fax:([phone]  PROGRESS NOTE  Patient Care Team: Shelda Pal, DO as PCP - General (Family Medicine) Debara Pickett Nadean Corwin, MD as PCP - Cardiology (Cardiology) Vickie Epley, MD as PCP - Electrophysiology (Cardiology) Magrinat, Virgie Dad, MD (Inactive) as Consulting Physician (Oncology) Donnie Mesa, MD as Consulting Physician (General Surgery) Wilford Corner, MD as Consulting Physician (Gastroenterology) Ples Specter, MD as Referring Physician (Internal Medicine) Larina Earthly, MD as Referring Physician (Hematology and Oncology) Grace Isaac, MD (Inactive) as Consulting Physician (Cardiothoracic Surgery)  Hematological/Oncological History # Marginal Zone Lymphoma, In Remission  (a) s/p coil embolization of the feeding (gastro-duodenal) artery 01/23/2015             (b) anemia--scant iron stores on bone marrow biopsy--s/p feraheme 02/13/2015   (1) right inguinal lymph node biopsy 01/22/2015 confirms mantle cell lymphoma             (a) bone marrow biopsy 02/06/2015 positive for involvement by the patient's mantle cell lymphoma             (b) IPI score of 5 (high risk) predicts a 5 year progression free survival of 50% with CHOP-Rituxan chemotherapy             (c) MIPI score of 5 (intermediate risk) predicts a median survival of 58 months             (d) the cells were CD5 positive, CD19 and 20+, CD23 negative, lambda restricted   (2) CHOP/Rituxan started 02/09/2015, completed 8 cycles 07/05/2015; total doxorubicin dose 300 mg/M2   (3)  [facility] consultation 04/06/2015. Patient opted against transplant consolidation   (4) rituximab maintenance started 08/16/2015, repeat every 2 months, discontinued February 2019             (a) PET scan had shown a suggestion of recurrence, but see (6) below   (5) PSA increase noted July 2017, back to baseline by December 2017             (a) also noted to be  high (13.0) October 2021, resolved with antibiotics   (6) restaging PET scan 11/17/2016 is consistent with disease progression             (a) repeat PET scan 02/02/2017 confirms increase in lymph nodes             (b) PET scan 04/20/2017 shows increasing hypermetabolic adenopathy             (c) bronchoscopy/mediastinoscopy 05/05/2017 shows granulomatous disease, noncaseating, with negative AFB/ GMS stains; <10% of lymphocytes are B-cell with slight lambda excess             (d) PET scan on 07/06/2017 shows mild increase in mediastinal lymphadenopathy. Increased hypermetabolic supraclavicular lymph node.             (e) PET scan 12/24/2017 interpreted as showing evidence of partial response                    (f) PET 09/21/2018 shows decreased size and metabolism of scattered measurable nodes; Deauville 4             (g) PET scan 11/29/2019 essentially unchanged except for increased prostate activity (see #10 below).             (h) thoracic surgery (Dr. Minus Breeding) feels it would be very difficult to obtain a biopsy of the small positive areas in the most  recent scan             (i) PET scan 11/13/2020 shows decreased activity in the previously noted subcentimeter Deauville-4 mediastinal and hilar adenopathy              (7) ibrutinib 420 mg daily started 02/09/2017, discontinued March 2019             (a) resumed April 2019 at 560 mg daily             (b) dose back to 420 mg daily as of May 2020  04/09/2021: Transition care to Dr. Lorenso Courier due to Dr. Virgie Dad retirement.  Patient continuing on ibrutinib 420 mg p.o. daily  Interval History:  [name] 71 y.o. male with medical history significant for cell lymphoma in remission who presents for a follow up visit. The patient's last visit was on 11/15/2020 with Dr. Jana Hakim. In the interim since the last visit he has continued on ibrutinib therapy without difficulty.  On exam today [name] reports he has been well in the interim since  his last visit 6 months ago.  He notes that he is "living his best life".  He notes that he has been having large amounts of energy and cannot wait for the weather to improve so that he can continue running.  He notes that he quit running about 2 months ago because of the cold weather but could run approximately 3 miles at a time.  He is also looking forward to cutting his grass.  He reports that his appetite has been good.  He notes that he has not had any issues with lymphadenopathy, fevers, chills, sweats, nausea, vomiting, or diarrhea.  On further discussion he notes he is not having any issues with bleeding, bruising, or dark stools.  He notes that if he does get a cut when shaving it does take mildly longer to stanch the bleeding.  He reports he is not having any palpitations and no signs of infection.  He is having some financial issues obtaining Eliquis therapy and would be open to discussing it with one of our financial counselors.  He otherwise denies any new or concerning issues.  A full 10 point ROS is listed below.  MEDICAL HISTORY:  Past Medical History:  Diagnosis Date   Anemia    Cancer ([place])    Lymphoma   Essential hypertension    GERD (gastroesophageal reflux disease)    GI bleed    HLD (hyperlipidemia)    Weakness     SURGICAL HISTORY: Past Surgical History:  Procedure Laterality Date   ESOPHAGOGASTRODUODENOSCOPY Left 01/18/2015   Procedure: ESOPHAGOGASTRODUODENOSCOPY (EGD);  Surgeon: Wilford Corner, MD;  Location[facility] ENDOSCOPY;  Service: Endoscopy;  Laterality: Left;   INGUINAL HERNIA REPAIR Right 01/22/2015   Procedure: RIGHT INGUINAL LYMPH NODE BX;  Surgeon: Donnie Mesa, MD;  Location: [place];  Service: General;  Laterality: Right;   MEDIASTINOSCOPY N/A 05/05/2017   Procedure: MEDIASTINOSCOPY;  Surgeon: Grace Isaac, MD;  Location[facility] OR;  Service: Thoracic;  Laterality: N/A;   PORTACATH PLACEMENT  01/26/2015    power port with tip SVC/[place]   SKIN  SURGERY     Small benign cysts over left scalp removed   VIDEO BRONCHOSCOPY WITH ENDOBRONCHIAL ULTRASOUND N/A 05/05/2017   Procedure: VIDEO BRONCHOSCOPY WITH ENDOBRONCHIAL ULTRASOUND;  Surgeon: Grace Isaac, MD;  Location: [place];  Service: Thoracic;  Laterality: N/A;    SOCIAL HISTORY: Social History   Socioeconomic History  Marital status: Married    Spouse name: Not on file   Number of children: Not on file   Years of education: Not on file   Highest education level: Not on file  Occupational History   Occupation: Retired  Tobacco Use   Smoking status: Never   Smokeless tobacco: Never  Vaping Use   Vaping Use: Never used  Substance and Sexual Activity   Alcohol use: Not Currently   Drug use: No   Sexual activity: Not on file  Other Topics Concern   Not on file  Social History Narrative   Not on file   Social Determinants of Health   Financial Resource Strain: Low Risk    Difficulty of Paying Living Expenses: Not very hard  Food Insecurity: No Food Insecurity   Worried About Running Out of Food in the Last Year: Never true   Ran Out of Food in the Last Year: Never true  Transportation Needs: No Transportation Needs   Lack of Transportation (Medical): No   Lack of Transportation (Non-Medical): No  Physical Activity: Sufficiently Active   Days of Exercise per Week: 7 days   Minutes of Exercise per Session: 60 min  Stress: No Stress Concern Present   Feeling of Stress : Not at all  Social Connections: Moderately Isolated   Frequency of Communication with Friends and Family: More than three times a week   Frequency of Social Gatherings with Friends and Family: More than three times a week   Attends Religious Services: Never   Marine scientist or Organizations: No   Attends Music therapist: Never   Marital Status: Married  Human resources officer Violence: Not At Risk   Fear of Current or Ex-Partner: No   Emotionally Abused: No   Physically  Abused: No   Sexually Abused: No    FAMILY HISTORY: Family History  Problem Relation Age of Onset   Hypertension Mother    Hypertension Father    Kidney failure Father    Hypertension Sister    Diabetes Sister    Prostate cancer Brother    Lupus Sister     ALLERGIES:  has No Known Allergies.  MEDICATIONS:  Current Outpatient Medications  Medication Sig Dispense Refill   apixaban (ELIQUIS) 5 MG TABS tablet Take 1 tablet (5 mg total) by mouth 2 (two) times daily. 60 tablet 11   atorvastatin (LIPITOR) 80 MG tablet TAKE 1 TABLET BY MOUTH EVERY DAY 90 tablet 1   carvedilol (COREG) 12.5 MG tablet Take 1.5 tablets (18.75 mg total) by mouth 2 (two) times daily with a meal. 240 tablet 3   empagliflozin (JARDIANCE) 10 MG TABS tablet Take 1 tablet (10 mg total) by mouth daily before breakfast. 30 tablet 11   ibrutinib (IMBRUVICA) 420 MG tablet Take 1 tablet (420 mg) by mouth daily. 28 tablet 6   ibrutinib (IMBRUVICA) 420 MG TABS TAKE 1 TABLET BY MOUTH DAILY. 28 tablet 6   sacubitril-valsartan (ENTRESTO) 97-103 MG Take 1 tablet by mouth 2 (two) times daily. 180 tablet 3   spironolactone (ALDACTONE) 25 MG tablet Take 1 tablet (25 mg total) by mouth daily. 90 tablet 3   No current facility-administered medications for this visit.    REVIEW OF SYSTEMS:   Constitutional: ( - ) fevers, ( - )  chills , ( - ) night sweats Eyes: ( - ) blurriness of vision, ( - ) double vision, ( - ) watery eyes Ears, nose, mouth, throat, and face: ( - )  mucositis, ( - ) sore throat Respiratory: ( - ) cough, ( - ) dyspnea, ( - ) wheezes Cardiovascular: ( - ) palpitation, ( - ) chest discomfort, ( - ) lower extremity swelling Gastrointestinal:  ( - ) nausea, ( - ) heartburn, ( - ) change in bowel habits Skin: ( - ) abnormal skin rashes Lymphatics: ( - ) new lymphadenopathy, ( - ) easy bruising Neurological: ( - ) numbness, ( - ) tingling, ( - ) new weaknesses Behavioral/Psych: ( - ) mood change, ( - ) new  changes  All other systems were reviewed with the patient and are negative.  PHYSICAL EXAMINATION:  Vitals:   04/09/21 0943  BP: 123/75  Pulse: 60  Resp: 16  Temp: 97.8 F (36.6 C)  SpO2: 100%   Filed Weights   04/09/21 0943  Weight: 192 lb 8 oz (87.3 kg)    GENERAL: Well-appearing middle-aged African-American male, alert, no distress and comfortable SKIN: skin color, texture, turgor are normal, no rashes or significant lesions EYES: conjunctiva are pink and non-injected, sclera clear LUNGS: clear to auscultation and percussion with normal breathing effort HEART: regular rate & rhythm and no murmurs and no lower extremity edema Musculoskeletal: no cyanosis of digits and no clubbing  PSYCH: alert & oriented x 3, fluent speech NEURO: no focal motor/sensory deficits  LABORATORY DATA:  I have reviewed the data as listed CBC Latest Ref Rng & Units 04/09/2021 11/28/2020 11/13/2020  WBC 4.0 - 10.5 K/uL 4.4 4.8 4.3  Hemoglobin 13.0 - 17.0 g/dL 12.3(L) 14.0 13.3  Hematocrit 39.0 - 52.0 % 38.2(L) 42.8 41.2  Platelets 150 - 400 K/uL 242 289 256    CMP Latest Ref Rng & Units 04/09/2021 01/09/2021 11/28/2020  Glucose 70 - 99 mg/dL 98 81 101(H)  BUN 8 - 23 mg/dL 17 16 25(H)  Creatinine 0.61 - 1.24 mg/dL 1.23 1.13 1.70(H)  Sodium 135 - 145 mmol/L 139 140 138  Potassium 3.5 - 5.1 mmol/L 4.3 4.3 3.8  Chloride 98 - 111 mmol/L 106 108 103  CO2 22 - 32 mmol/L _0 Calcium 8.9 - 10.3 mg/dL 9.0 8.9 9.0  Total Protein 6.5 - 8.1 g/dL 6.4(L) - 6.1(L)  Total Bilirubin 0.3 - 1.2 mg/dL 0.9 - 1.4(H)  Alkaline Phos 38 - 126 U/L 31(L) - 33(L)  AST 15 - 41 U/L 25 - 25  ALT 0 - 44 U/L 21 - 21    RADIOGRAPHIC STUDIES: No results found.  ASSESSMENT & PLAN [name] 71 y.o. male with medical history significant for cell lymphoma in remission who presents for a follow up visit.  #Mantle Cell Lymphoma in Remission -- At this time would recommend repeat imaging based on symptoms and blood  work.  Prior PET CT scan in October 2022 showed no evidence of residual or recurrent disease, though there were some lymph nodes concerning for sarcoidosis --Patient has been remission since at least 06/11/2015 at which time his PET CT scan showed no evidence of recurrent or residual disease. --Continue ibrutinib 420 mg p.o. daily --Would recommend return to clinic every 3 months with labs.  No orders of the defined types were placed in this encounter.   All questions were answered. The patient knows to call the clinic with any problems, questions or concerns.  A total of more than 40 minutes were spent on this encounter with face-to-face time and non-face-to-face time, including preparing to see the patient, ordering tests and/or medications, counseling the patient and  coordination of care as outlined above.   Ledell Peoples, MD Department of Hematology/Oncology [place] at [facility] Phone: [phone] Pager: [phone] Email: [email or web address]  04/09/2021 1:25 PM

## 2021-04-09 ENCOUNTER — Other Ambulatory Visit: Payer: Self-pay

## 2021-04-09 ENCOUNTER — Inpatient Hospital Stay: Payer: Medicare PPO | Attending: Hematology and Oncology

## 2021-04-09 ENCOUNTER — Inpatient Hospital Stay: Payer: Medicare PPO | Admitting: Hematology and Oncology

## 2021-04-09 VITALS — BP 123/75 | HR 60 | Temp 97.8°F | Resp 16 | Wt 192.5 lb

## 2021-04-09 DIAGNOSIS — I6329 Cerebral infarction due to unspecified occlusion or stenosis of other precerebral arteries: Secondary | ICD-10-CM | POA: Diagnosis not present

## 2021-04-09 DIAGNOSIS — I10 Essential (primary) hypertension: Secondary | ICD-10-CM | POA: Diagnosis not present

## 2021-04-09 DIAGNOSIS — C884 Extranodal marginal zone B-cell lymphoma of mucosa-associated lymphoid tissue [MALT-lymphoma]: Secondary | ICD-10-CM | POA: Diagnosis not present

## 2021-04-09 DIAGNOSIS — K276 Chronic or unspecified peptic ulcer, site unspecified, with both hemorrhage and perforation: Secondary | ICD-10-CM

## 2021-04-09 DIAGNOSIS — E785 Hyperlipidemia, unspecified: Secondary | ICD-10-CM | POA: Insufficient documentation

## 2021-04-09 DIAGNOSIS — C8319 Mantle cell lymphoma, extranodal and solid organ sites: Secondary | ICD-10-CM

## 2021-04-09 DIAGNOSIS — D649 Anemia, unspecified: Secondary | ICD-10-CM | POA: Diagnosis not present

## 2021-04-09 DIAGNOSIS — Z7984 Long term (current) use of oral hypoglycemic drugs: Secondary | ICD-10-CM | POA: Insufficient documentation

## 2021-04-09 LAB — CBC WITH DIFFERENTIAL/PLATELET
Abs Immature Granulocytes: 0.02 10*3/uL (ref 0.00–0.07)
Basophils Absolute: 0 10*3/uL (ref 0.0–0.1)
Basophils Relative: 1 %
Eosinophils Absolute: 0 10*3/uL (ref 0.0–0.5)
Eosinophils Relative: 1 %
HCT: 38.2 % — ABNORMAL LOW (ref 39.0–52.0)
Hemoglobin: 12.3 g/dL — ABNORMAL LOW (ref 13.0–17.0)
Immature Granulocytes: 1 %
Lymphocytes Relative: 18 %
Lymphs Abs: 0.8 10*3/uL (ref 0.7–4.0)
MCH: 28.4 pg (ref 26.0–34.0)
MCHC: 32.2 g/dL (ref 30.0–36.0)
MCV: 88.2 fL (ref 80.0–100.0)
Monocytes Absolute: 0.6 10*3/uL (ref 0.1–1.0)
Monocytes Relative: 13 %
Neutro Abs: 3 10*3/uL (ref 1.7–7.7)
Neutrophils Relative %: 66 %
Platelets: 242 10*3/uL (ref 150–400)
RBC: 4.33 MIL/uL (ref 4.22–5.81)
RDW: 13.7 % (ref 11.5–15.5)
WBC: 4.4 10*3/uL (ref 4.0–10.5)
nRBC: 0 % (ref 0.0–0.2)

## 2021-04-09 LAB — HEMOGLOBIN A1C
Hgb A1c MFr Bld: 5.6 % (ref 4.8–5.6)
Mean Plasma Glucose: 114.02 mg/dL

## 2021-04-09 LAB — COMPREHENSIVE METABOLIC PANEL
ALT: 21 U/L (ref 0–44)
AST: 25 U/L (ref 15–41)
Albumin: 4.2 g/dL (ref 3.5–5.0)
Alkaline Phosphatase: 31 U/L — ABNORMAL LOW (ref 38–126)
Anion gap: 5 (ref 5–15)
BUN: 17 mg/dL (ref 8–23)
CO2: 28 mmol/L (ref 22–32)
Calcium: 9 mg/dL (ref 8.9–10.3)
Chloride: 106 mmol/L (ref 98–111)
Creatinine, Ser: 1.23 mg/dL (ref 0.61–1.24)
GFR, Estimated: >60 mL/min (ref >60)
Glucose, Bld: 98 mg/dL (ref 70–99)
Potassium: 4.3 mmol/L (ref 3.5–5.1)
Sodium: 139 mmol/L (ref 135–145)
Total Bilirubin: 0.9 mg/dL (ref 0.3–1.2)
Total Protein: 6.4 g/dL — ABNORMAL LOW (ref 6.5–8.1)

## 2021-04-09 LAB — LACTATE DEHYDROGENASE: LDH: 235 U/L — ABNORMAL HIGH (ref 98–192)

## 2021-04-12 ENCOUNTER — Other Ambulatory Visit (HOSPITAL_COMMUNITY): Payer: Self-pay

## 2021-04-15 ENCOUNTER — Inpatient Hospital Stay: Payer: Medicare PPO | Admitting: Hematology and Oncology

## 2021-04-15 ENCOUNTER — Inpatient Hospital Stay: Payer: Medicare PPO

## 2021-04-16 ENCOUNTER — Other Ambulatory Visit: Payer: Self-pay

## 2021-04-16 ENCOUNTER — Encounter (HOSPITAL_COMMUNITY): Payer: Self-pay | Admitting: Cardiology

## 2021-04-16 ENCOUNTER — Ambulatory Visit (HOSPITAL_BASED_OUTPATIENT_CLINIC_OR_DEPARTMENT_OTHER)
Admission: RE | Admit: 2021-04-16 | Discharge: 2021-04-16 | Disposition: A | Discharge status: Home / Self Care | Payer: Medicare PPO | Source: Ambulatory Visit | Attending: Cardiology | Admitting: Cardiology

## 2021-04-16 ENCOUNTER — Ambulatory Visit (HOSPITAL_COMMUNITY)
Admission: RE | Admit: 2021-04-16 | Discharge: 2021-04-16 | Disposition: A | Discharge status: Home / Self Care | Payer: Medicare PPO | Source: Ambulatory Visit | Attending: Cardiology | Admitting: Cardiology

## 2021-04-16 VITALS — BP 110/70 | HR 50 | Wt 195.2 lb

## 2021-04-16 DIAGNOSIS — I5022 Chronic systolic (congestive) heart failure: Secondary | ICD-10-CM

## 2021-04-16 DIAGNOSIS — Z79899 Other long term (current) drug therapy: Secondary | ICD-10-CM | POA: Insufficient documentation

## 2021-04-16 DIAGNOSIS — Z9221 Personal history of antineoplastic chemotherapy: Secondary | ICD-10-CM | POA: Insufficient documentation

## 2021-04-16 DIAGNOSIS — I340 Nonrheumatic mitral (valve) insufficiency: Secondary | ICD-10-CM | POA: Insufficient documentation

## 2021-04-16 DIAGNOSIS — E785 Hyperlipidemia, unspecified: Secondary | ICD-10-CM | POA: Diagnosis not present

## 2021-04-16 DIAGNOSIS — I7121 Aneurysm of the ascending aorta, without rupture: Secondary | ICD-10-CM

## 2021-04-16 DIAGNOSIS — Z7984 Long term (current) use of oral hypoglycemic drugs: Secondary | ICD-10-CM | POA: Diagnosis not present

## 2021-04-16 DIAGNOSIS — I2510 Atherosclerotic heart disease of native coronary artery without angina pectoris: Secondary | ICD-10-CM | POA: Insufficient documentation

## 2021-04-16 DIAGNOSIS — Z8572 Personal history of non-Hodgkin lymphomas: Secondary | ICD-10-CM | POA: Insufficient documentation

## 2021-04-16 DIAGNOSIS — I480 Paroxysmal atrial fibrillation: Secondary | ICD-10-CM | POA: Diagnosis not present

## 2021-04-16 DIAGNOSIS — R9431 Abnormal electrocardiogram [ECG] [EKG]: Secondary | ICD-10-CM | POA: Diagnosis not present

## 2021-04-16 DIAGNOSIS — I6522 Occlusion and stenosis of left carotid artery: Secondary | ICD-10-CM | POA: Diagnosis not present

## 2021-04-16 DIAGNOSIS — I428 Other cardiomyopathies: Secondary | ICD-10-CM | POA: Diagnosis not present

## 2021-04-16 DIAGNOSIS — I110 Hypertensive heart disease with heart failure: Secondary | ICD-10-CM | POA: Insufficient documentation

## 2021-04-16 DIAGNOSIS — I700 Atherosclerosis of aorta: Secondary | ICD-10-CM | POA: Diagnosis not present

## 2021-04-16 DIAGNOSIS — Q231 Congenital insufficiency of aortic valve: Secondary | ICD-10-CM | POA: Diagnosis not present

## 2021-04-16 DIAGNOSIS — Z8673 Personal history of transient ischemic attack (TIA), and cerebral infarction without residual deficits: Secondary | ICD-10-CM | POA: Diagnosis not present

## 2021-04-16 DIAGNOSIS — I4892 Unspecified atrial flutter: Secondary | ICD-10-CM | POA: Diagnosis not present

## 2021-04-16 DIAGNOSIS — Z7901 Long term (current) use of anticoagulants: Secondary | ICD-10-CM | POA: Diagnosis not present

## 2021-04-16 DIAGNOSIS — I493 Ventricular premature depolarization: Secondary | ICD-10-CM | POA: Diagnosis not present

## 2021-04-16 DIAGNOSIS — Z8249 Family history of ischemic heart disease and other diseases of the circulatory system: Secondary | ICD-10-CM | POA: Diagnosis not present

## 2021-04-16 LAB — ECHOCARDIOGRAM COMPLETE
Area-P 1/2: 1.95 cm2
P 1/2 time: 721 ms
S' Lateral: 3.4 cm

## 2021-04-16 NOTE — Progress Notes (Signed)
PCP: Shelda Pal, DO ?Cardiology: Dr. Debara Pickett ?HF Cardiology: Dr. Aundra Dubin ? ?71 y.o. with history of mantle cell lymphoma, CVA, PACs, and chronic systolic CHF was referred by Dr. Debara Pickett for followup of CHF.  Patient was diagnosed with mantle cell lymphoma in 2017 and started CHOP-R in 6/17.  He is currently on ibrutinib with good control of lymphoma.  In 6/21, he was admitted with CVA.  Imaging showed acute right pontine infarct but also chronic right mid frontal lobe infarct and superior left cerebellar infarct.  Carotid dopplers showed moderate LICA stenosis, but carotid stenosis was not thought to be the etiology of CVAs.  He worse a Zio patch for 2 wks, showing frequent PACs but no AF.   During the 6/21 hospitalization, echo showed EF 35-40%.  Coronary CT angiogram in 8/21 showed mild nonobstructive disease.  Most recent echo in 5/22 showed further deterioration in LV function with EF 25-30%, moderate LV dilation, and normal RV.   ? ?Cardiac MRI in 9/22 showed LV EF 43%, RV EF 48%, small areas of mid-wall LGE in the basal inferior and basal inferoseptal walls.  ? ?Atrial flutter has been noted, he is now on Eliquis.  ? ?Echo was done today and reviewed, EF 45-50%, mild global hypokinesis, normal RV, normal IVC, bicuspid aortic valve with mild AI.  ? ?Patient returns for followup of CHF.  Weight is up 2 lbs.  He feels "great."  Push mows his grass and the grass at his kids' houses as well.  No significant exertional dyspnea.  No palpitations.  No chest pain.  Lightheadedness at times if he stands too fast.    ? ?Labs (7/22): LDL 55, K 4.1, creatinine 1.17, LDL 55 ?Labs (8/22): K 4.9, creatinine 1.3 ?Labs (10/22): hgb 14, K 3.8, creatinine 1.7 ?Labs (12/22): LDL 61 ?Labs (3/23): K 4.3, creatinine 1.23, LFTs normal, hgb 12.3 ? ?ECG (personally reviewed): NSR, PVCs, inferolateral TWIs ? ?PMH: ?1. Non-hodgkin's lymphoma: Mantle cell lymphoma.  He was treated with CHOP-R starting 6/17.  Currently on ibrutinib.   ?2. Carotid stenosis: Carotid dopplers (2/53) with 66-44% LICA stenosis.  ?- Carotid dopplers (0/34): 74-25% LICA.  ?3. CVA (6/21): Acute right pontine infarct but also with chronic right mid frontal lobe infarct and superior left cerebellar infarct.  Carotid stenosis was not thought to be the etiology.   ?- 2 wk Zio patch in 7/21 showed frequent PACs but no atrial fibrillation.  ?4. PACs: Frequent.  ?5. 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  ?- Echo (6/21): EF 35-40% ?- Coronary CT angiography (8/21): mild nonobstructive CAD, ascending aorta 4.3 cm ?- Echo (5/22): EF 25-30%, moderate LV dilation, mild AI, normal RV.  ?- cMRI (9/22): LV EF 43%, RV EF 48%, small areas of mid-wall LGE in the basal inferior and basal inferoseptal walls. ?- Echo (3/23): EF 45-50%, mild global hypokinesis, normal RV, normal IVC, bicuspid aortic valve with mild AI.  ?6. 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  ?7. Duodenal ulcer with GI bleeding ?8. Atrial flutter: Paroxysmal.  ?9. Functionally bicuspid aortic valve: Mild AI on 3/23 echo.  ? ?Social History  ? ?Socioeconomic History  ? Marital status: Married  ?  Spouse name: Not on file  ? Number of children: Not on file  ? Years of education: Not on file  ? Highest education level: Not on file  ?Occupational History  ? Occupation: Retired  ?Tobacco Use  ? Smoking status: Never  ? Smokeless tobacco: Never  ?  Vaping Use  ? Vaping Use: Never used  ?Substance and Sexual Activity  ? Alcohol use: Not Currently  ? Drug use: No  ? Sexual activity: Not on file  ?Other Topics Concern  ? Not on file  ?Social History Narrative  ? Not on file  ? ?Social Determinants of Health  ? ?Financial Resource Strain: Low Risk   ? Difficulty of Paying Living Expenses: Not very hard  ?Food Insecurity: No Food Insecurity  ? Worried About Charity fundraiser in the Last Year: Never true  ? Ran Out of Food in the Last Year: Never true  ?Transportation Needs: No Transportation  Needs  ? Lack of Transportation (Medical): No  ? Lack of Transportation (Non-Medical): No  ?Physical Activity: Sufficiently Active  ? Days of Exercise per Week: 7 days  ? Minutes of Exercise per Session: 60 min  ?Stress: No Stress Concern Present  ? Feeling of Stress : Not at all  ?Social Connections: Moderately Isolated  ? Frequency of Communication with Friends and Family: More than three times a week  ? Frequency of Social Gatherings with Friends and Family: More than three times a week  ? Attends Religious Services: Never  ? Active Member of Clubs or Organizations: No  ? Attends Archivist Meetings: Never  ? Marital Status: Married  ?Intimate Partner Violence: Not At Risk  ? Fear of Current or Ex-Partner: No  ? Emotionally Abused: No  ? Physically Abused: No  ? Sexually Abused: No  ? ?Family History  ?Problem Relation Age of Onset  ? Hypertension Mother   ? Hypertension Father   ? Kidney failure Father   ? Hypertension Sister   ? Diabetes Sister   ? Prostate cancer Brother   ? Lupus Sister   ? ?ROS: All systems reviewed and negative except as per HPI. ? ?Current Outpatient Medications  ?Medication Sig Dispense Refill  ? apixaban (ELIQUIS) 5 MG TABS tablet Take 1 tablet (5 mg total) by mouth 2 (two) times daily. 60 tablet 11  ? atorvastatin (LIPITOR) 80 MG tablet TAKE 1 TABLET BY MOUTH EVERY DAY 90 tablet 1  ? carvedilol (COREG) 12.5 MG tablet Take 1.5 tablets (18.75 mg total) by mouth 2 (two) times daily with a meal. 240 tablet 3  ? empagliflozin (JARDIANCE) 10 MG TABS tablet Take 1 tablet (10 mg total) by mouth daily before breakfast. 30 tablet 11  ? ibrutinib (IMBRUVICA) 420 MG tablet Take 1 tablet (420 mg) by mouth daily. 28 tablet 6  ? sacubitril-valsartan (ENTRESTO) 97-103 MG Take 1 tablet by mouth 2 (two) times daily. 180 tablet 3  ? spironolactone (ALDACTONE) 25 MG tablet Take 1 tablet (25 mg total) by mouth daily. 90 tablet 3  ? ?No current facility-administered medications for this encounter.   ? ?BP 110/70   Pulse (!) 50   Wt 88.5 kg (195 lb 3.2 oz)   SpO2 99%   BMI 28.01 kg/m?  ?General: NAD ?Neck: No JVD, no thyromegaly or thyroid nodule.  ?Lungs: Clear to auscultation bilaterally with normal respiratory effort. ?CV: Nondisplaced PMI.  Heart regular S1/S2, no S3/S4, 1/6 SEM RUSB.  No peripheral edema.  No carotid bruit.  Normal pedal pulses.  ?Abdomen: Soft, nontender, no hepatosplenomegaly, no distention.  ?Skin: Intact without lesions or rashes.  ?Neurologic: Alert and oriented x 3.  ?Psych: Normal affect. ?Extremities: No clubbing or cyanosis.  ?HEENT: Normal.   ? ?Assessment/Plan: ?1. Chronic systolic CHF: Nonischemic cardiomyopathy.  Coronary CTA in 8/21 with  mild nonobstructive CAD.  Most recent echo in 5/22 with EF 25-30%, moderate LV dilation, normal RV.  I suspect the most likely cause of his cardiomyopathy is prior doxorubicin chemotherapy with initial treatment of his mantle cell lymphoma.  However, there have also been multiple family members with CHF, apparently of uncertain etiology so cannot rule out a role for genetic cardiomyopathy.  Cardiac MRI in 9/22 showed LV EF 43%, RV EF 48%, small areas of mid-wall LGE in the basal inferior and basal inferoseptal walls => cannot rule out myocarditis as cause of cardiomyopathy.  Echo today showed EF 45-50%.  He is not volume overloaded, NYHA class I symptoms.   ?- Continue Coreg 18.75 mg bid, will not increase with bradycardia.   ?- Continue spironolactone 25 mg daily. Recent BMET was stable.  ?- Continue empagliflozin 10 mg daily.  ?- Continue Entresto 97/103 bid.  ?- EF is out of range for ICD.  ?2. CVA: Patient had acute CVA in 6/21, imaging also showed evidence for prior CVAs as well.  He had moderate LICA stenosis but this was thought to be a bystander by neurology.  CVA likely related to atrial fibrillation.  ?- Continue Eliquis.  ?3. Carotid stenosis: Moderate LICA stenosis, repeat dopplers in 8/23.  ?4. Hyperlipidemia: Goal LDL < 70.   ?- Continue atorvastatin, good lipids in 12/22.  ?5. Atrial fibrillation: Paroxysmal.  NSR today.  ?- Continue apixaban.  ?6. Functionally bicuspid aortic valve: Echo today showed no AS, mild AI.  ?7. A

## 2021-04-16 NOTE — Progress Notes (Signed)
Medication Samples have been provided to the patient. ? ?Drug name: Eliquis       Strength: '5mg'$         Qty: 4 boxes  LOT: [id number]  Exp.Date: 04/2023 ? ?Dosing instructions: take 1 tablet Twice daily ? ? ?The patient has been instructed regarding the correct time, dose, and frequency of taking this medication, including desired effects and most common side effects.  ? ?[name] M [name] ?11:06 AM ?04/16/2021 ? ?

## 2021-04-16 NOTE — Patient Instructions (Addendum)
Thank you for your visit today. ? ? ?Blood work in 3 months ? ?Your provider has ordered a angiogram chest for you. ? ?Your physician recommends that you schedule a follow-up appointment in: 6 months. ? ?If you have any questions or concerns before your next appointment please send [place] a message through [place] or call our office at [phone].   ? ?TO LEAVE A MESSAGE FOR THE NURSE SELECT OPTION 2, PLEASE LEAVE A MESSAGE INCLUDING: ?YOUR NAME ?DATE OF BIRTH ?CALL BACK NUMBER ?REASON FOR CALL**this is important as we prioritize the call backs ? ?YOU WILL RECEIVE A CALL BACK THE SAME DAY AS LONG AS YOU CALL BEFORE 4:00 PM ? ?At the [facility], you and your health needs are our priority. As part of our continuing mission to provide you with exceptional heart care, we have created designated Provider Care Teams. These Care Teams include your primary Cardiologist (physician) and Advanced Practice Providers (APPs- Physician Assistants and Nurse Practitioners) who all work together to provide you with the care you need, when you need it.  ? ?You may see any of the following providers on your designated Care Team at your next follow up: ?Dr Glori Bickers ?Dr Loralie Champagne ?Darrick Grinder, NP ?Lyda Jester, PA ?Jessica Milford,NP ?Marlyce Huge, PA ?Audry Riles, PharmD ? ? ?Please be sure to bring in all your medications bottles to every appointment.  ? ? ? ?

## 2021-04-17 ENCOUNTER — Other Ambulatory Visit (HOSPITAL_COMMUNITY): Payer: Self-pay

## 2021-04-18 ENCOUNTER — Telehealth (HOSPITAL_COMMUNITY): Payer: Self-pay | Admitting: *Deleted

## 2021-04-22 ENCOUNTER — Other Ambulatory Visit (HOSPITAL_COMMUNITY): Payer: Self-pay

## 2021-04-22 DIAGNOSIS — I7121 Aneurysm of the ascending aorta, without rupture: Secondary | ICD-10-CM

## 2021-05-01 ENCOUNTER — Telehealth (HOSPITAL_COMMUNITY): Payer: Self-pay | Admitting: Vascular Surgery

## 2021-05-01 NOTE — Telephone Encounter (Signed)
Left pt detailed message giving MRA of chest 4/13 @ 2pm.. aske dpt o call back to confirm appt ?

## 2021-05-07 ENCOUNTER — Other Ambulatory Visit (HOSPITAL_COMMUNITY): Payer: Self-pay

## 2021-05-16 ENCOUNTER — Other Ambulatory Visit (HOSPITAL_COMMUNITY): Payer: Self-pay

## 2021-05-16 ENCOUNTER — Other Ambulatory Visit (HOSPITAL_COMMUNITY): Payer: Medicare PPO

## 2021-05-28 ENCOUNTER — Telehealth (HOSPITAL_COMMUNITY): Payer: Self-pay | Admitting: *Deleted

## 2021-05-28 NOTE — Telephone Encounter (Signed)
MRA auth extended. Valid 4/26 for 30days  ?

## 2021-05-29 ENCOUNTER — Ambulatory Visit (HOSPITAL_COMMUNITY)
Admission: RE | Admit: 2021-05-29 | Discharge: 2021-05-29 | Disposition: A | Discharge status: Home / Self Care | Payer: Medicare PPO | Source: Ambulatory Visit | Attending: Cardiology | Admitting: Cardiology

## 2021-05-29 DIAGNOSIS — I7121 Aneurysm of the ascending aorta, without rupture: Secondary | ICD-10-CM | POA: Diagnosis not present

## 2021-05-29 DIAGNOSIS — R918 Other nonspecific abnormal finding of lung field: Secondary | ICD-10-CM | POA: Diagnosis not present

## 2021-05-29 DIAGNOSIS — I712 Thoracic aortic aneurysm, without rupture, unspecified: Secondary | ICD-10-CM | POA: Diagnosis not present

## 2021-05-29 IMAGING — MR MR MRA CHEST W/ OR W/O CM
21 series · 21 of 21 positions shown · IV contrast (Gadavist)
Comparison: PET-CT [DATE]

CLINICAL DATA: Ascending aortic aneurysm

EXAM:
MRA CHEST WITH OR WITHOUT CONTRAST
TECHNIQUE: Angiographic images of the chest were obtained using MRA technique
without and with intravenous contrast.
CONTRAST:  9mL GADAVIST GADOBUTROL 1 MMOL/ML IV SOLN

[Series 2: t2_trufi_tra_p2_bh · axial · 8.0mm · 0.62mm/px · 1 of 22 slices shown]
[im 1/22]
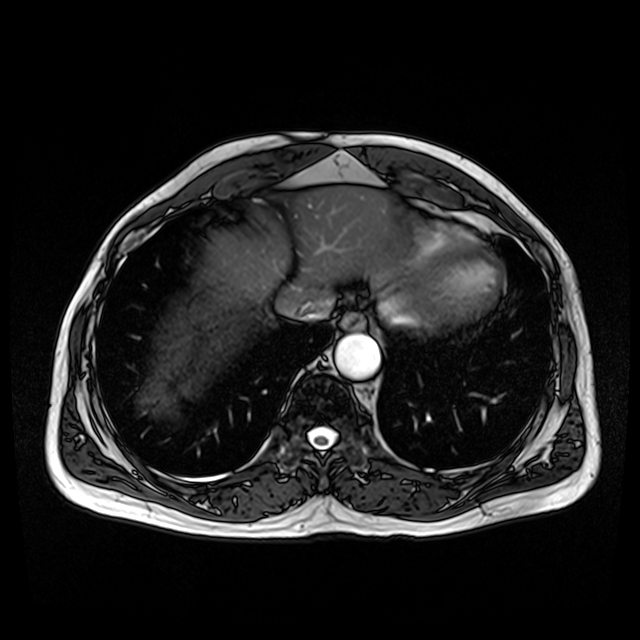

[Series 3: axial_db_haste_loc · axial · 7.0mm · 1.48mm/px · 1 of 22 slices shown]
[im 1/22]
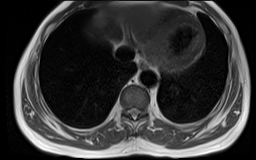

[Series 4: (person_name)_(person_name)_(person_name) · sagittal · 8.0mm · 1.79mm/px · 1 of 25 slices shown (1 of 10)]
[im 1/25]
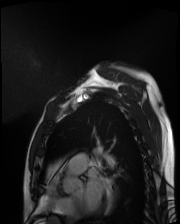

[Series 4: (person_name)_(person_name)_(person_name) · sagittal · 8.0mm · 1.79mm/px · 1 of 25 slices shown (2 of 10)]
[im 1/25]
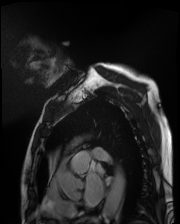

[Series 4: (person_name)_(person_name)_(person_name) · sagittal · 8.0mm · 1.79mm/px · 1 of 25 slices shown (3 of 10)]
[im 1/25]
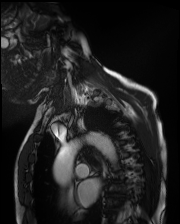

[Series 4: (person_name)_(person_name)_(person_name) · sagittal · 8.0mm · 1.79mm/px · 1 of 25 slices shown (4 of 10)]
[im 1/25]
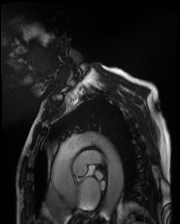

[Series 4: (person_name)_(person_name)_(person_name) · sagittal · 8.0mm · 1.79mm/px · 1 of 25 slices shown (5 of 10)]
[im 1/25]
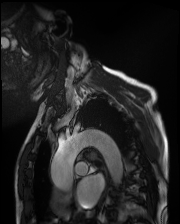

[Series 5: T1 dynamic · axial · non-contrast · 3.3mm · 1.18mm/px · 1 of 64 slices shown]
[im 1/64]
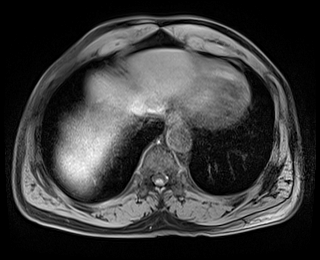

[Series 6: (person_name)_(person_name)_(person_name) · sagittal · 8.0mm · 1.79mm/px · 1 of 25 slices shown (6 of 10)]
[im 1/25]
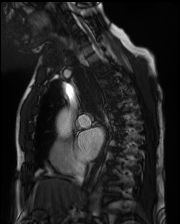

[Series 6: (person_name)_(person_name)_(person_name) · sagittal · 8.0mm · 1.79mm/px · 1 of 25 slices shown (7 of 10)]
[im 1/25]
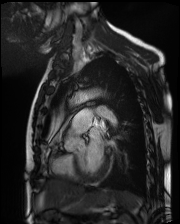

[Series 6: (person_name)_(person_name)_(person_name) · sagittal · 8.0mm · 1.79mm/px · 1 of 25 slices shown (8 of 10)]
[im 1/25]
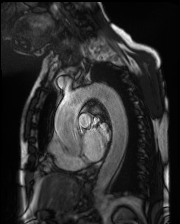

[Series 6: (person_name)_(person_name)_(person_name) · sagittal · 8.0mm · 1.79mm/px · 1 of 25 slices shown (9 of 10)]
[im 1/25]
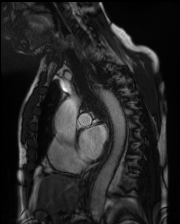

[Series 6: (person_name)_(person_name)_(person_name) · sagittal · 8.0mm · 1.79mm/px · 1 of 25 slices shown (10 of 10)]
[im 1/25]
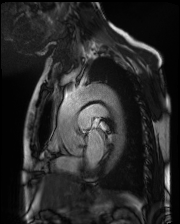

[Series 7: angio_fl3d_sag_pre · sagittal · 1.1mm · 1.17mm/px · 1 of 112 slices shown]
[im 1/112]
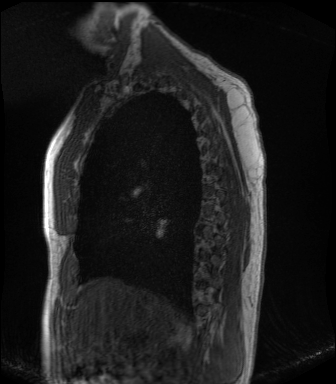

[Series 8: care_bolus_sag · sagittal · 20.0mm · 1.56mm/px · 1 of 44 slices shown]
[im 1/44]
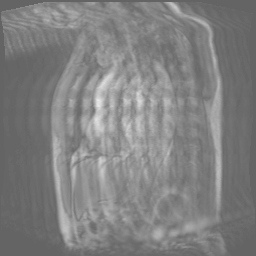

[Series 9: candy cane ce-arterial · sagittal · arterial · 1.1mm · 1.17mm/px · 1 of 112 slices shown]
[im 1/112]
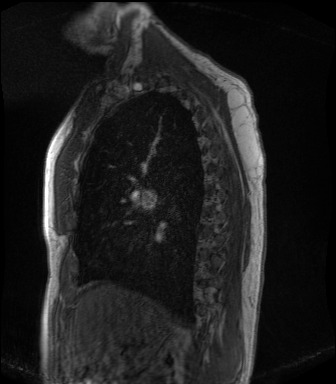

[Series 10: candy cane ce-arterial_sub · sagittal · 1.1mm · 1.17mm/px · 1 of 112 slices shown]
[im 1/112]
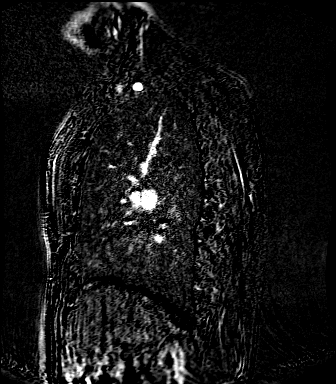

[Series 12: candy cane ce-venous · sagittal · portal-venous · 1.1mm · 1.17mm/px · 1 of 112 slices shown]
[im 1/112]
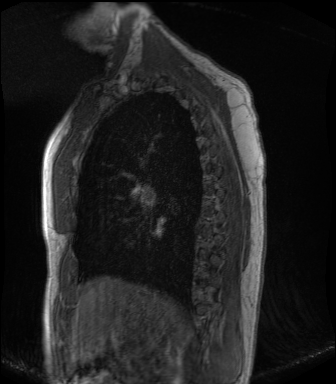

[Series 13: candy cane ce-venous_sub · sagittal · 1.1mm · 1.17mm/px · 1 of 112 slices shown]
[im 1/112]
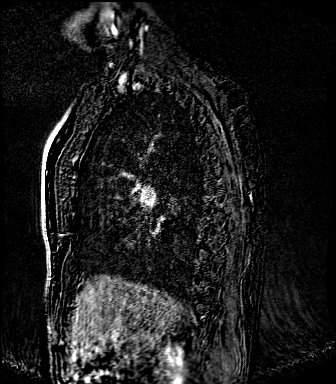

[Series 15: T1 dynamic post-contrast · axial · 3.3mm · 1.18mm/px · 1 of 64 slices shown]
[im 1/64]
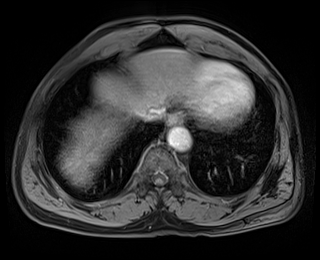

[Series 1020: mip rotate · sagittal · 1.1mm · 0.32mm/px · 1 of 16 slices shown]
[im 1/16]
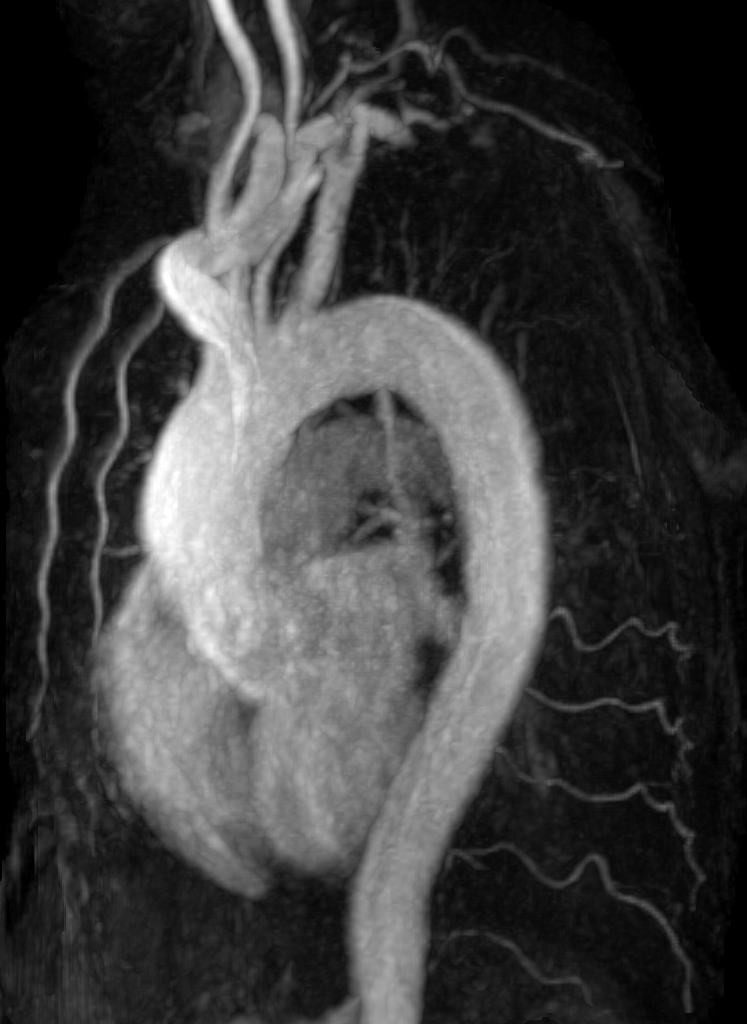

[21 of 21 positions shown; findings below may reference images not displayed]

FINDINGS: VASCULAR

Aorta: Mild fusiform aneurysmal dilation of the tubular portion of
the ascending thoracic aorta. The maximal diameter is 4.2 cm,
unchanged compared to prior imaging. No evidence of dissection.
Conventional 3 vessel arch anatomy.

Heart: The heart is normal in size.  No pericardial effusion.

Pulmonary Arteries: Normal size main and central pulmonary arteries.
No evidence of PE.

Other: None.

NON-VASCULAR

Lungs/pleura: Small 4 mm subpleural nodular focus of T2 signal
hyperintensity and potential enhancement along the posterior aspect
of the right upper lobe. Findings are most consistent with a small
subpleural pulmonary nodule. Similar findings were present on PET-CT
imaging from [DATE]. No evidence of pleural effusion.

Mediastinum: No mediastinal mass or suspicious adenopathy.

Upper abdomen: Limited visualization of the upper abdomen
demonstrates no focal abnormality.

Bones: No focal signal abnormality or abnormal enhancement.
IMPRESSION: VASCULAR

1. Stable mild aneurysmal dilation of the ascending thoracic aorta
with a maximal diameter of 4.2 cm. Recommend annual imaging followup
by CTA or MRA. This recommendation follows [6U]
ACCF/AHA/AATS/ACR/ASA/SCA/JUNIOR/JUNIOR/JUNIOR/JUNIOR Guidelines for the
Diagnosis and Management of Patients with Thoracic Aortic Disease.
Circulation. [6U]; 121: [id number]. Aortic aneurysm NOS
([6U]-[6U]).

NON-VASCULAR

1. No acute or clinically significant abnormality.

## 2021-05-29 MED ORDER — GADOBUTROL 1 MMOL/ML IV SOLN
9.0000 mL | Freq: Once | INTRAVENOUS | Status: AC | PRN
  Administered 2021-05-29: 9 mL via INTRAVENOUS

## 2021-06-06 ENCOUNTER — Other Ambulatory Visit (HOSPITAL_COMMUNITY): Payer: Self-pay

## 2021-06-13 ENCOUNTER — Other Ambulatory Visit (HOSPITAL_COMMUNITY): Payer: Self-pay

## 2021-07-04 ENCOUNTER — Other Ambulatory Visit (HOSPITAL_COMMUNITY): Payer: Self-pay

## 2021-07-09 ENCOUNTER — Other Ambulatory Visit: Payer: Self-pay | Admitting: Physician Assistant

## 2021-07-09 DIAGNOSIS — C8319 Mantle cell lymphoma, extranodal and solid organ sites: Secondary | ICD-10-CM

## 2021-07-10 ENCOUNTER — Other Ambulatory Visit (HOSPITAL_COMMUNITY): Payer: Self-pay

## 2021-07-10 ENCOUNTER — Other Ambulatory Visit: Payer: Self-pay

## 2021-07-10 ENCOUNTER — Inpatient Hospital Stay: Payer: Medicare PPO | Attending: Hematology and Oncology

## 2021-07-10 ENCOUNTER — Inpatient Hospital Stay: Payer: Medicare PPO | Admitting: Physician Assistant

## 2021-07-10 VITALS — BP 131/76 | HR 64 | Temp 98.0°F | Resp 17 | Ht 70.0 in | Wt 191.4 lb

## 2021-07-10 DIAGNOSIS — C8310 Mantle cell lymphoma, unspecified site: Secondary | ICD-10-CM | POA: Diagnosis not present

## 2021-07-10 DIAGNOSIS — C8319 Mantle cell lymphoma, extranodal and solid organ sites: Secondary | ICD-10-CM

## 2021-07-10 LAB — CMP (CANCER CENTER ONLY)
ALT: 14 U/L (ref 0–44)
AST: 18 U/L (ref 15–41)
Albumin: 4.3 g/dL (ref 3.5–5.0)
Alkaline Phosphatase: 33 U/L — ABNORMAL LOW (ref 38–126)
Anion gap: 6 (ref 5–15)
BUN: 18 mg/dL (ref 8–23)
CO2: 27 mmol/L (ref 22–32)
Calcium: 9.2 mg/dL (ref 8.9–10.3)
Chloride: 106 mmol/L (ref 98–111)
Creatinine: 1.27 mg/dL — ABNORMAL HIGH (ref 0.61–1.24)
GFR, Estimated: >60 mL/min (ref >60)
Glucose, Bld: 88 mg/dL (ref 70–99)
Potassium: 4.1 mmol/L (ref 3.5–5.1)
Sodium: 139 mmol/L (ref 135–145)
Total Bilirubin: 0.8 mg/dL (ref 0.3–1.2)
Total Protein: 6.6 g/dL (ref 6.5–8.1)

## 2021-07-10 LAB — CBC WITH DIFFERENTIAL (CANCER CENTER ONLY)
Abs Immature Granulocytes: 0.03 10*3/uL (ref 0.00–0.07)
Basophils Absolute: 0 10*3/uL (ref 0.0–0.1)
Basophils Relative: 1 %
Eosinophils Absolute: 0.1 10*3/uL (ref 0.0–0.5)
Eosinophils Relative: 1 %
HCT: 37.1 % — ABNORMAL LOW (ref 39.0–52.0)
Hemoglobin: 12.2 g/dL — ABNORMAL LOW (ref 13.0–17.0)
Immature Granulocytes: 1 %
Lymphocytes Relative: 23 %
Lymphs Abs: 0.9 10*3/uL (ref 0.7–4.0)
MCH: 28.9 pg (ref 26.0–34.0)
MCHC: 32.9 g/dL (ref 30.0–36.0)
MCV: 87.9 fL (ref 80.0–100.0)
Monocytes Absolute: 0.7 10*3/uL (ref 0.1–1.0)
Monocytes Relative: 18 %
Neutro Abs: 2.3 10*3/uL (ref 1.7–7.7)
Neutrophils Relative %: 56 %
Platelet Count: 231 10*3/uL (ref 150–400)
RBC: 4.22 MIL/uL (ref 4.22–5.81)
RDW: 14 % (ref 11.5–15.5)
WBC Count: 4.1 10*3/uL (ref 4.0–10.5)
nRBC: 0 % (ref 0.0–0.2)

## 2021-07-10 LAB — LACTATE DEHYDROGENASE: LDH: 206 U/L — ABNORMAL HIGH (ref 98–192)

## 2021-07-10 NOTE — Progress Notes (Signed)
[place] [phone]   Fax:([phone]  PROGRESS NOTE  Patient Care Team: Shelda Pal, DO as PCP - General (Family Medicine) Debara Pickett Nadean Corwin, MD as PCP - Cardiology (Cardiology) Vickie Epley, MD as PCP - Electrophysiology (Cardiology) Donnie Mesa, MD as Consulting Physician (General Surgery) Wilford Corner, MD as Consulting Physician (Gastroenterology) Ples Specter, MD as Referring Physician (Internal Medicine) Larina Earthly, MD as Referring Physician (Hematology and Oncology) Grace Isaac, MD (Inactive) as Consulting Physician (Cardiothoracic Surgery)  Hematological/Oncological History # Marginal Zone Lymphoma, In Remission  (a) s/p coil embolization of the feeding (gastro-duodenal) artery 01/23/2015             (b) anemia--scant iron stores on bone marrow biopsy--s/p feraheme 02/13/2015   (1) right inguinal lymph node biopsy 01/22/2015 confirms mantle cell lymphoma             (a) bone marrow biopsy 02/06/2015 positive for involvement by the patient's mantle cell lymphoma             (b) IPI score of 5 (high risk) predicts a 5 year progression free survival of 50% with CHOP-Rituxan chemotherapy             (c) MIPI score of 5 (intermediate risk) predicts a median survival of 58 months             (d) the cells were CD5 positive, CD19 and 20+, CD23 negative, lambda restricted   (2) CHOP/Rituxan started 02/09/2015, completed 8 cycles 07/05/2015; total doxorubicin dose 300 mg/M2   (3)  [facility] consultation 04/06/2015. Patient opted against transplant consolidation   (4) rituximab maintenance started 08/16/2015, repeat every 2 months, discontinued February 2019             (a) PET scan had shown a suggestion of recurrence, but see (6) below   (5) PSA increase noted July 2017, back to baseline by December 2017             (a) also noted to be high (13.0) October 2021, resolved with antibiotics   (6) restaging  PET scan 11/17/2016 is consistent with disease progression             (a) repeat PET scan 02/02/2017 confirms increase in lymph nodes             (b) PET scan 04/20/2017 shows increasing hypermetabolic adenopathy             (c) bronchoscopy/mediastinoscopy 05/05/2017 shows granulomatous disease, noncaseating, with negative AFB/ GMS stains; <10% of lymphocytes are B-cell with slight lambda excess             (d) PET scan on 07/06/2017 shows mild increase in mediastinal lymphadenopathy. Increased hypermetabolic supraclavicular lymph node.             (e) PET scan 12/24/2017 interpreted as showing evidence of partial response                    (f) PET 09/21/2018 shows decreased size and metabolism of scattered measurable nodes; Deauville 4             (g) PET scan 11/29/2019 essentially unchanged except for increased prostate activity (see #10 below).             (h) thoracic surgery (Dr. Minus Breeding) feels it would be very difficult to obtain a biopsy of the small positive areas in the most recent scan             (  i) PET scan 11/13/2020 shows decreased activity in the previously noted subcentimeter Deauville-4 mediastinal and hilar adenopathy              (7) ibrutinib 420 mg daily started 02/09/2017, discontinued March 2019             (a) resumed April 2019 at 560 mg daily             (b) dose back to 420 mg daily as of May 2020  04/09/2021: Transition care to Dr. Lorenso Courier due to Dr. Virgie Dad retirement.  Patient continuing on ibrutinib 420 mg p.o. daily  Interval History:  [name] 71 y.o. male with medical history significant for cell lymphoma in remission who presents for a follow up visit. The patient's last visit was on 04/09/2021 with Dr. Lorenso Courier. In the interim since the last visit he has continued on ibrutinib therapy without difficulty.  On exam today [name] reports that he continues to do well without any significant changes to his health.  He is staying active and tries to  exercise regularly.  He has a good appetite and denies any weight loss.  He denies any GI symptoms including nausea, vomiting, diarrhea or constipation.  He has no pain including abdominal pain back pain.  He notes that he has not had any issues with lymphadenopathy, fevers, chills, sweats, shortness of breath, chest pain or cough.  He has no other complaints.  A full 10 point ROS is listed below.  MEDICAL HISTORY:  Past Medical History:  Diagnosis Date   Anemia    Cancer ([place])    Lymphoma   Essential hypertension    GERD (gastroesophageal reflux disease)    GI bleed    HLD (hyperlipidemia)    Weakness     SURGICAL HISTORY: Past Surgical History:  Procedure Laterality Date   ESOPHAGOGASTRODUODENOSCOPY Left 01/18/2015   Procedure: ESOPHAGOGASTRODUODENOSCOPY (EGD);  Surgeon: Wilford Corner, MD;  Location: [facility] ENDOSCOPY;  Service: Endoscopy;  Laterality: Left;   INGUINAL HERNIA REPAIR Right 01/22/2015   Procedure: RIGHT INGUINAL LYMPH NODE BX;  Surgeon: Donnie Mesa, MD;  Location: [place];  Service: General;  Laterality: Right;   MEDIASTINOSCOPY N/A 05/05/2017   Procedure: MEDIASTINOSCOPY;  Surgeon: Grace Isaac, MD;  Location: [place];  Service: Thoracic;  Laterality: N/A;   PORTACATH PLACEMENT  01/26/2015    power port with tip SVC/RA [place]   SKIN SURGERY     Small benign cysts over left scalp removed   VIDEO BRONCHOSCOPY WITH ENDOBRONCHIAL ULTRASOUND N/A 05/05/2017   Procedure: VIDEO BRONCHOSCOPY WITH ENDOBRONCHIAL ULTRASOUND;  Surgeon: Grace Isaac, MD;  Location: [place];  Service: Thoracic;  Laterality: N/A;    SOCIAL HISTORY: Social History   Socioeconomic History   Marital status: Married    Spouse name: Not on file   Number of children: Not on file   Years of education: Not on file   Highest education level: Not on file  Occupational History   Occupation: Retired  Tobacco Use   Smoking status: Never   Smokeless tobacco: Never  Vaping Use   Vaping Use:  Never used  Substance and Sexual Activity   Alcohol use: Not Currently   Drug use: No   Sexual activity: Not on file  Other Topics Concern   Not on file  Social History Narrative   Not on file   Social Determinants of Health   Financial Resource Strain: Low Risk    Difficulty of Paying Living Expenses: Not  very hard  Food Insecurity: No Food Insecurity   Worried About Charity fundraiser in the Last Year: Never true   Ran Out of Food in the Last Year: Never true  Transportation Needs: No Transportation Needs   Lack of Transportation (Medical): No   Lack of Transportation (Non-Medical): No  Physical Activity: Sufficiently Active   Days of Exercise per Week: 7 days   Minutes of Exercise per Session: 60 min  Stress: No Stress Concern Present   Feeling of Stress : Not at all  Social Connections: Moderately Isolated   Frequency of Communication with Friends and Family: More than three times a week   Frequency of Social Gatherings with Friends and Family: More than three times a week   Attends Religious Services: Never   Marine scientist or Organizations: No   Attends Music therapist: Never   Marital Status: Married  Human resources officer Violence: Not At Risk   Fear of Current or Ex-Partner: No   Emotionally Abused: No   Physically Abused: No   Sexually Abused: No    FAMILY HISTORY: Family History  Problem Relation Age of Onset   Hypertension Mother    Hypertension Father    Kidney failure Father    Hypertension Sister    Diabetes Sister    Prostate cancer Brother    Lupus Sister     ALLERGIES:  has No Known Allergies.  MEDICATIONS:  Current Outpatient Medications  Medication Sig Dispense Refill   apixaban (ELIQUIS) 5 MG TABS tablet Take 1 tablet (5 mg total) by mouth 2 (two) times daily. 60 tablet 11   atorvastatin (LIPITOR) 80 MG tablet TAKE 1 TABLET BY MOUTH EVERY DAY 90 tablet 1   carvedilol (COREG) 12.5 MG tablet Take 1.5 tablets (18.75 mg  total) by mouth 2 (two) times daily with a meal. 240 tablet 3   empagliflozin (JARDIANCE) 10 MG TABS tablet Take 1 tablet (10 mg total) by mouth daily before breakfast. 30 tablet 11   ibrutinib (IMBRUVICA) 420 MG tablet Take 1 tablet (420 mg) by mouth daily. 28 tablet 6   sacubitril-valsartan (ENTRESTO) 97-103 MG Take 1 tablet by mouth 2 (two) times daily. 180 tablet 3   spironolactone (ALDACTONE) 25 MG tablet Take 1 tablet (25 mg total) by mouth daily. 90 tablet 3   No current facility-administered medications for this visit.    REVIEW OF SYSTEMS:   Constitutional: ( - ) fevers, ( - )  chills , ( - ) night sweats Eyes: ( - ) blurriness of vision, ( - ) double vision, ( - ) watery eyes Ears, nose, mouth, throat, and face: ( - ) mucositis, ( - ) sore throat Respiratory: ( - ) cough, ( - ) dyspnea, ( - ) wheezes Cardiovascular: ( - ) palpitation, ( - ) chest discomfort, ( - ) lower extremity swelling Gastrointestinal:  ( - ) nausea, ( - ) heartburn, ( - ) change in bowel habits Skin: ( - ) abnormal skin rashes Lymphatics: ( - ) new lymphadenopathy, ( - ) easy bruising Neurological: ( - ) numbness, ( - ) tingling, ( - ) new weaknesses Behavioral/Psych: ( - ) mood change, ( - ) new changes  All other systems were reviewed with the patient and are negative.  PHYSICAL EXAMINATION:  Vitals:   07/10/21 1023  BP: 131/76  Pulse: 64  Resp: 17  Temp: 98 F (36.7 C)  SpO2: 100%   Filed Weights   07/10/21 1023  Weight: 191 lb 6.4 oz (86.8 kg)    GENERAL: Well-appearing middle-aged African-American male, alert, no distress and comfortable SKIN: skin color, texture, turgor are normal, no rashes or significant lesions EYES: conjunctiva are pink and non-injected, sclera clear LUNGS: clear to auscultation and percussion with normal breathing effort HEART: regular rate & rhythm and no murmurs and no lower extremity edema Musculoskeletal: no cyanosis of digits and no clubbing. Large mobile,  soft mass on mid back most consistent with lipoma.  PSYCH: alert & oriented x 3, fluent speech NEURO: no focal motor/sensory deficits  LABORATORY DATA:  I have reviewed the data as listed    Latest Ref Rng & Units 07/10/2021    9:57 AM 04/09/2021    9:28 AM 11/28/2020   12:56 PM  CBC  WBC 4.0 - 10.5 K/uL 4.1   4.4   4.8    Hemoglobin 13.0 - 17.0 g/dL 12.2   12.3   14.0    Hematocrit 39.0 - 52.0 % 37.1   38.2   42.8    Platelets 150 - 400 K/uL 231   242   289         Latest Ref Rng & Units 04/09/2021    9:28 AM 01/09/2021   11:16 AM 11/28/2020   12:56 PM  CMP  Glucose 70 - 99 mg/dL 98   81   101    BUN 8 - 23 mg/dL _0 Creatinine 0.61 - 1.24 mg/dL 1.23   1.13   1.70    Sodium 135 - 145 mmol/L 139   140   138    Potassium 3.5 - 5.1 mmol/L 4.3   4.3   3.8    Chloride 98 - 111 mmol/L 106   108   103    CO2 22 - 32 mmol/L _1 Calcium 8.9 - 10.3 mg/dL 9.0   8.9   9.0    Total Protein 6.5 - 8.1 g/dL 6.4    6.1    Total Bilirubin 0.3 - 1.2 mg/dL 0.9    1.4    Alkaline Phos 38 - 126 U/L 31    33    AST 15 - 41 U/L 25    25    ALT 0 - 44 U/L 21    21      RADIOGRAPHIC STUDIES: No results found.  ASSESSMENT & PLAN [name] 71 y.o. male with medical history significant for mantle cell lymphoma in remission who presents for a follow up visit.  #Mantle Cell Lymphoma in Remission -- Prior PET CT scan in October 2022 showed no evidence of residual or recurrent disease, though there were some lymph nodes concerning for sarcoidosis --Patient has been remission since at least 06/11/2015 at which time his PET CT scan showed no evidence of recurrent or residual disease. --Labs from today were reviewed and require no intervention. Mild anemia with Hgb 12.2 that we will monitor.  --Continue ibrutinib 420 mg p.o. daily --Return to clinic 3 months with labs. Will be due for repeat PET scan prior to next visit.  No orders of the defined types were placed in this  encounter.   All questions were answered. The patient knows to call the clinic with any problems, questions or concerns.  I have spent a total of 30 minutes minutes of face-to-face and non-face-to-face time, preparing to see the patient performing a medically appropriate  examination, counseling and educating the patient, ordering tests, documenting clinical information in the electronic health record, and care coordination.    Gary Brigham PA-C Department of Hematology/Oncology [place] [facility] Phone: [phone]   07/10/2021 10:39 AM

## 2021-07-17 ENCOUNTER — Ambulatory Visit (HOSPITAL_COMMUNITY)
Admission: RE | Admit: 2021-07-17 | Discharge: 2021-07-17 | Disposition: A | Discharge status: Home / Self Care | Payer: Medicare PPO | Source: Ambulatory Visit | Attending: Internal Medicine | Admitting: Internal Medicine

## 2021-07-17 DIAGNOSIS — I7121 Aneurysm of the ascending aorta, without rupture: Secondary | ICD-10-CM | POA: Diagnosis not present

## 2021-07-17 LAB — BASIC METABOLIC PANEL
Anion gap: 5 (ref 5–15)
BUN: 18 mg/dL (ref 8–23)
CO2: 26 mmol/L (ref 22–32)
Calcium: 8.9 mg/dL (ref 8.9–10.3)
Chloride: 108 mmol/L (ref 98–111)
Creatinine, Ser: 1.28 mg/dL — ABNORMAL HIGH (ref 0.61–1.24)
GFR, Estimated: >60 mL/min (ref >60)
Glucose, Bld: 96 mg/dL (ref 70–99)
Potassium: 4.4 mmol/L (ref 3.5–5.1)
Sodium: 139 mmol/L (ref 135–145)

## 2021-08-05 ENCOUNTER — Other Ambulatory Visit: Payer: Self-pay | Admitting: Oncology

## 2021-08-05 ENCOUNTER — Other Ambulatory Visit (HOSPITAL_COMMUNITY): Payer: Self-pay

## 2021-08-05 MED ORDER — IBRUTINIB 420 MG PO TABS
420.0000 mg | ORAL_TABLET | Freq: Every day | ORAL | 6 refills | Status: DC
  Filled 2021-08-05 (×2): qty 28, 28d supply, fill #0
  Filled 2021-09-03: qty 28, 28d supply, fill #1
  Filled 2021-10-01: qty 28, 28d supply, fill #2
  Filled 2021-10-28 – 2021-11-27 (×2): qty 28, 28d supply, fill #3
  Filled 2021-12-24: qty 28, 28d supply, fill #4
  Filled 2022-01-21 – 2022-02-04 (×2): qty 28, 28d supply, fill #5
  Filled 2022-03-05: qty 28, 28d supply, fill #6

## 2021-08-05 NOTE — Telephone Encounter (Signed)
HI [name], it looks like this was a Dr. Jana Hakim pt that now follows you and Dr. Lorenso Courier if you could please review and refill if needed.

## 2021-08-08 ENCOUNTER — Other Ambulatory Visit (HOSPITAL_COMMUNITY): Payer: Self-pay

## 2021-08-30 ENCOUNTER — Other Ambulatory Visit (HOSPITAL_COMMUNITY): Payer: Self-pay

## 2021-09-01 ENCOUNTER — Other Ambulatory Visit: Payer: Self-pay | Admitting: Family Medicine

## 2021-09-03 ENCOUNTER — Other Ambulatory Visit (HOSPITAL_COMMUNITY): Payer: Self-pay

## 2021-09-09 ENCOUNTER — Other Ambulatory Visit (HOSPITAL_COMMUNITY): Payer: Self-pay

## 2021-10-01 ENCOUNTER — Other Ambulatory Visit (HOSPITAL_COMMUNITY): Payer: Self-pay

## 2021-10-02 ENCOUNTER — Other Ambulatory Visit (HOSPITAL_COMMUNITY): Payer: Self-pay

## 2021-10-09 ENCOUNTER — Other Ambulatory Visit: Payer: Self-pay

## 2021-10-09 DIAGNOSIS — C8319 Mantle cell lymphoma, extranodal and solid organ sites: Secondary | ICD-10-CM

## 2021-10-10 ENCOUNTER — Other Ambulatory Visit: Payer: Self-pay

## 2021-10-10 ENCOUNTER — Inpatient Hospital Stay: Payer: Medicare PPO | Attending: Hematology and Oncology

## 2021-10-10 ENCOUNTER — Inpatient Hospital Stay (HOSPITAL_BASED_OUTPATIENT_CLINIC_OR_DEPARTMENT_OTHER): Payer: Medicare PPO | Admitting: Hematology and Oncology

## 2021-10-10 ENCOUNTER — Encounter (HOSPITAL_COMMUNITY)
Admission: RE | Admit: 2021-10-10 | Discharge: 2021-10-10 | Disposition: A | Discharge status: Home / Self Care | Payer: Medicare PPO | Source: Ambulatory Visit | Attending: Physician Assistant | Admitting: Physician Assistant

## 2021-10-10 VITALS — BP 121/82 | HR 57 | Temp 97.3°F | Resp 15 | Wt 188.3 lb

## 2021-10-10 DIAGNOSIS — C8319 Mantle cell lymphoma, extranodal and solid organ sites: Secondary | ICD-10-CM

## 2021-10-10 DIAGNOSIS — Z08 Encounter for follow-up examination after completed treatment for malignant neoplasm: Secondary | ICD-10-CM | POA: Insufficient documentation

## 2021-10-10 DIAGNOSIS — Z8572 Personal history of non-Hodgkin lymphomas: Secondary | ICD-10-CM | POA: Diagnosis not present

## 2021-10-10 DIAGNOSIS — R918 Other nonspecific abnormal finding of lung field: Secondary | ICD-10-CM | POA: Diagnosis not present

## 2021-10-10 LAB — CBC WITH DIFFERENTIAL (CANCER CENTER ONLY)
Abs Immature Granulocytes: 0.01 10*3/uL (ref 0.00–0.07)
Basophils Absolute: 0 10*3/uL (ref 0.0–0.1)
Basophils Relative: 1 %
Eosinophils Absolute: 0 10*3/uL (ref 0.0–0.5)
Eosinophils Relative: 0 %
HCT: 37.6 % — ABNORMAL LOW (ref 39.0–52.0)
Hemoglobin: 12.1 g/dL — ABNORMAL LOW (ref 13.0–17.0)
Immature Granulocytes: 0 %
Lymphocytes Relative: 20 %
Lymphs Abs: 1 10*3/uL (ref 0.7–4.0)
MCH: 28.3 pg (ref 26.0–34.0)
MCHC: 32.2 g/dL (ref 30.0–36.0)
MCV: 88.1 fL (ref 80.0–100.0)
Monocytes Absolute: 0.7 10*3/uL (ref 0.1–1.0)
Monocytes Relative: 13 %
Neutro Abs: 3.3 10*3/uL (ref 1.7–7.7)
Neutrophils Relative %: 66 %
Platelet Count: 214 10*3/uL (ref 150–400)
RBC: 4.27 MIL/uL (ref 4.22–5.81)
RDW: 14.1 % (ref 11.5–15.5)
WBC Count: 5 10*3/uL (ref 4.0–10.5)
nRBC: 0 % (ref 0.0–0.2)

## 2021-10-10 LAB — CMP (CANCER CENTER ONLY)
ALT: 19 U/L (ref 0–44)
AST: 19 U/L (ref 15–41)
Albumin: 4.4 g/dL (ref 3.5–5.0)
Alkaline Phosphatase: 30 U/L — ABNORMAL LOW (ref 38–126)
Anion gap: 5 (ref 5–15)
BUN: 20 mg/dL (ref 8–23)
CO2: 28 mmol/L (ref 22–32)
Calcium: 9.4 mg/dL (ref 8.9–10.3)
Chloride: 106 mmol/L (ref 98–111)
Creatinine: 1.29 mg/dL — ABNORMAL HIGH (ref 0.61–1.24)
GFR, Estimated: >60 mL/min (ref >60)
Glucose, Bld: 94 mg/dL (ref 70–99)
Potassium: 4.5 mmol/L (ref 3.5–5.1)
Sodium: 139 mmol/L (ref 135–145)
Total Bilirubin: 1.1 mg/dL (ref 0.3–1.2)
Total Protein: 6.7 g/dL (ref 6.5–8.1)

## 2021-10-10 LAB — LACTATE DEHYDROGENASE: LDH: 211 U/L — ABNORMAL HIGH (ref 98–192)

## 2021-10-10 LAB — GLUCOSE, CAPILLARY: Glucose-Capillary: 101 mg/dL — ABNORMAL HIGH (ref 70–99)

## 2021-10-10 MED ORDER — FLUDEOXYGLUCOSE F - 18 (FDG) INJECTION
8.7000 | Freq: Once | INTRAVENOUS | Status: AC | PRN
  Administered 2021-10-10: 9.48 via INTRAVENOUS

## 2021-10-10 NOTE — Progress Notes (Signed)
[facility] [facility] [phone]   [phone]  PROGRESS NOTE  Patient Care Team: Shelda Pal, DO as PCP - General (Family Medicine) Debara Pickett Nadean Corwin, MD as PCP - Cardiology (Cardiology) Vickie Epley, MD as PCP - Electrophysiology (Cardiology) Donnie Mesa, MD as Consulting Physician (General Surgery) Wilford Corner, MD as Consulting Physician (Gastroenterology) Ples Specter, MD as Referring Physician (Internal Medicine) Larina Earthly, MD as Referring Physician (Hematology and Oncology) Grace Isaac, MD (Inactive) as Consulting Physician (Cardiothoracic Surgery) Orson Slick, MD as Consulting Physician (Hematology and Oncology)  Hematological/Oncological History # Marginal Zone Lymphoma, In Remission  (a) s/p coil embolization of the feeding (gastro-duodenal) artery 01/23/2015             (b) anemia--scant iron stores on bone marrow biopsy--s/p feraheme 02/13/2015   (1) right inguinal lymph node biopsy 01/22/2015 confirms mantle cell lymphoma             (a) bone marrow biopsy 02/06/2015 positive for involvement by the patient's mantle cell lymphoma             (b) IPI score of 5 (high risk) predicts a 5 year progression free survival of 50% with CHOP-Rituxan chemotherapy             (c) MIPI score of 5 (intermediate risk) predicts a median survival of 58 months             (d) the cells were CD5 positive, CD19 and 20+, CD23 negative, lambda restricted   (2) CHOP/Rituxan started 02/09/2015, completed 8 cycles 07/05/2015; total doxorubicin dose 300 mg/M2   (3)  [facility] consultation 04/06/2015. Patient opted against transplant consolidation   (4) rituximab maintenance started 08/16/2015, repeat every 2 months, discontinued February 2019             (a) PET scan had shown a suggestion of recurrence, but see (6) below   (5) PSA increase noted July 2017, back to baseline by December 2017             (a) also noted to  be high (13.0) October 2021, resolved with antibiotics   (6) restaging PET scan 11/17/2016 is consistent with disease progression             (a) repeat PET scan 02/02/2017 confirms increase in lymph nodes             (b) PET scan 04/20/2017 shows increasing hypermetabolic adenopathy             (c) bronchoscopy/mediastinoscopy 05/05/2017 shows granulomatous disease, noncaseating, with negative AFB/ GMS stains; <10% of lymphocytes are B-cell with slight lambda excess             (d) PET scan on 07/06/2017 shows mild increase in mediastinal lymphadenopathy. Increased hypermetabolic supraclavicular lymph node.             (e) PET scan 12/24/2017 interpreted as showing evidence of partial response                    (f) PET 09/21/2018 shows decreased size and metabolism of scattered measurable nodes; Deauville 4             (g) PET scan 11/29/2019 essentially unchanged except for increased prostate activity (see #10 below).             (h) thoracic surgery (Dr. Minus Breeding) feels it would be very difficult to obtain a biopsy of the small positive areas in the  most recent scan             (i) PET scan 11/13/2020 shows decreased activity in the previously noted subcentimeter Deauville-4 mediastinal and hilar adenopathy              (7) ibrutinib 420 mg daily started 02/09/2017, discontinued March 2019             (a) resumed April 2019 at 560 mg daily             (b) dose back to 420 mg daily as of May 2020  04/09/2021: Transition care to Dr. Lorenso Courier due to Dr. Virgie Dad retirement.  Patient continuing on ibrutinib 420 mg p.o. daily  Interval History:  [name] 70 y.o. Gary with medical history significant for mantle cell lymphoma in remission who presents for a follow up visit. The patient's last visit was on 07/10/2021 with Dede Query. In the interim since the last visit he has continued on ibrutinib therapy without difficulty and underwent a PET CT scan this AM.  On exam today [name]  reports she has been well overall interim since her last visit.  His weight is 188 pounds though he notes he like to put on more muscle weight.  Does his best to lift the radicular large bruise on his left arm during a recent lifting episode.  He reports that he continues to get approved him for 20 mg p.o. daily without difficulty.  He notes that he is not having any overt bleeding such as nosebleeds, gum bleeding, blood in stool or urine.  He reports he also does not have any palpitations.  Has had no recent infections.  His energy levels are good and he maintains a very active lifestyle including mowing the yard.  He denies any bumps or lumps or findings concerning for lymphadenopathy or relapse disease.  He denies any fevers, chills, sweats, nausea, vomiting or diarrhea.  A full 10 point ROS is listed below.  MEDICAL HISTORY:  Past Medical History:  Diagnosis Date   Anemia    Cancer ([place])    Lymphoma   Essential hypertension    GERD (gastroesophageal reflux disease)    GI bleed    HLD (hyperlipidemia)    Weakness     SURGICAL HISTORY: Past Surgical History:  Procedure Laterality Date   ESOPHAGOGASTRODUODENOSCOPY Left 01/18/2015   Procedure: ESOPHAGOGASTRODUODENOSCOPY (EGD);  Surgeon: Wilford Corner, MD;  Location: [facility] ENDOSCOPY;  Service: Endoscopy;  Laterality: Left;   INGUINAL HERNIA REPAIR Right 01/22/2015   Procedure: RIGHT INGUINAL LYMPH NODE BX;  Surgeon: Donnie Mesa, MD;  Location: [facility];  Service: General;  Laterality: Right;   MEDIASTINOSCOPY N/A 05/05/2017   Procedure: MEDIASTINOSCOPY;  Surgeon: Grace Isaac, MD;  Location: [facility] OR;  Service: Thoracic;  Laterality: N/A;   PORTACATH PLACEMENT  01/26/2015    power port with tip SVC/RA [place]   SKIN SURGERY     Small benign cysts over left scalp removed   VIDEO BRONCHOSCOPY WITH ENDOBRONCHIAL ULTRASOUND N/A 05/05/2017   Procedure: VIDEO BRONCHOSCOPY WITH ENDOBRONCHIAL ULTRASOUND;  Surgeon: Grace Isaac, MD;   Location: [facility];  Service: Thoracic;  Laterality: N/A;    SOCIAL HISTORY: Social History   Socioeconomic History   Marital status: Married    Spouse name: Not on file   Number of children: Not on file   Years of education: Not on file   Highest education level: Not on file  Occupational History   Occupation: Retired  Tobacco  Use   Smoking status: Never   Smokeless tobacco: Never  Vaping Use   Vaping Use: Never used  Substance and Sexual Activity   Alcohol use: Not Currently   Drug use: No   Sexual activity: Not on file  Other Topics Concern   Not on file  Social History Narrative   Not on file   Social Determinants of Health   Financial Resource Strain: Low Risk  (04/02/2021)   Overall Financial Resource Strain (CARDIA)    Difficulty of Paying Living Expenses: Not very hard  Food Insecurity: No Food Insecurity (04/02/2021)   Hunger Vital Sign    Worried About Running Out of Food in the Last Year: Never true    Ran Out of Food in the Last Year: Never true  Transportation Needs: No Transportation Needs (04/02/2021)   PRAPARE - Hydrologist (Medical): No    Lack of Transportation (Non-Medical): No  Physical Activity: Sufficiently Active (04/02/2021)   Exercise Vital Sign    Days of Exercise per Week: 7 days    Minutes of Exercise per Session: 60 min  Stress: No Stress Concern Present (04/02/2021)   [place]    Feeling of Stress : Not at all  Social Connections: Moderately Isolated (04/02/2021)   Social Connection and Isolation Panel [NHANES]    Frequency of Communication with Friends and Family: More than three times a week    Frequency of Social Gatherings with Friends and Family: More than three times a week    Attends Religious Services: Never    Marine scientist or Organizations: No    Attends Archivist Meetings: Never    Marital Status: Married  Arboriculturist Violence: Not At Risk (04/02/2021)   Humiliation, Afraid, Rape, and Kick questionnaire    Fear of Current or Ex-Partner: No    Emotionally Abused: No    Physically Abused: No    Sexually Abused: No    FAMILY HISTORY: Family History  Problem Relation Age of Onset   Hypertension Mother    Hypertension Father    Kidney failure Father    Hypertension Sister    Diabetes Sister    Prostate cancer Brother    Lupus Sister     ALLERGIES:  has No Known Allergies.  MEDICATIONS:  Current Outpatient Medications  Medication Sig Dispense Refill   apixaban (ELIQUIS) 5 MG TABS tablet Take 1 tablet (5 mg total) by mouth 2 (two) times daily. 60 tablet 11   atorvastatin (LIPITOR) 80 MG tablet TAKE 1 TABLET BY MOUTH EVERY DAY 90 tablet 1   carvedilol (COREG) 12.5 MG tablet Take 1.5 tablets (18.75 mg total) by mouth 2 (two) times daily with a meal. 240 tablet 3   empagliflozin (JARDIANCE) 10 MG TABS tablet Take 1 tablet (10 mg total) by mouth daily before breakfast. 30 tablet 11   ibrutinib (IMBRUVICA) 420 MG tablet Take 1 tablet (420 mg) by mouth daily. 28 tablet 6   sacubitril-valsartan (ENTRESTO) 97-103 MG Take 1 tablet by mouth 2 (two) times daily. 180 tablet 3   spironolactone (ALDACTONE) 25 MG tablet Take 1 tablet (25 mg total) by mouth daily. 90 tablet 3   No current facility-administered medications for this visit.    REVIEW OF SYSTEMS:   Constitutional: ( - ) fevers, ( - )  chills , ( - ) night sweats Eyes: ( - ) blurriness of vision, ( - ) double  vision, ( - ) watery eyes Ears, nose, mouth, throat, and face: ( - ) mucositis, ( - ) sore throat Respiratory: ( - ) cough, ( - ) dyspnea, ( - ) wheezes Cardiovascular: ( - ) palpitation, ( - ) chest discomfort, ( - ) lower extremity swelling Gastrointestinal:  ( - ) nausea, ( - ) heartburn, ( - ) change in bowel habits Skin: ( - ) abnormal skin rashes Lymphatics: ( - ) new lymphadenopathy, ( - ) easy bruising Neurological: ( - )  numbness, ( - ) tingling, ( - ) new weaknesses Behavioral/Psych: ( - ) mood change, ( - ) new changes  All other systems were reviewed with the patient and are negative.  PHYSICAL EXAMINATION:  Vitals:   10/10/21 1110  BP: 121/82  Pulse: (!) 57  Resp: 15  Temp: (!) 97.3 F (36.3 C)  SpO2: 99%    Filed Weights   10/10/21 1110  Weight: 188 lb 4.8 oz (85.4 kg)     GENERAL: Well-appearing middle-aged African-American Gary, alert, no distress and comfortable SKIN: skin color, texture, turgor are normal, no rashes or significant lesions EYES: conjunctiva are pink and non-injected, sclera clear LUNGS: clear to auscultation and percussion with normal breathing effort HEART: regular rate & rhythm and no murmurs and no lower extremity edema Musculoskeletal: no cyanosis of digits and no clubbing. Large mobile, soft mass on mid back most consistent with lipoma.  PSYCH: alert & oriented x 3, fluent speech NEURO: no focal motor/sensory deficits  LABORATORY DATA:  I have reviewed the data as listed    Latest Ref Rng & Units 10/10/2021   10:48 AM 07/10/2021    9:57 AM 04/09/2021    9:28 AM  CBC  WBC 4.0 - 10.5 K/uL 5.0  4.1  4.4   Hemoglobin 13.0 - 17.0 g/dL 12.1  12.2  12.3   Hematocrit 39.0 - 52.0 % 37.6  37.1  38.2   Platelets 150 - 400 K/uL 214  231  242        Latest Ref Rng & Units 10/10/2021   10:48 AM 07/17/2021    8:37 AM 07/10/2021    9:57 AM  CMP  Glucose 70 - 99 mg/dL 94  96  88   BUN 8 - 23 mg/dL _0 Creatinine 0.61 - 1.24 mg/dL 1.29  1.28  1.27   Sodium 135 - 145 mmol/L 139  139  139   Potassium 3.5 - 5.1 mmol/L 4.5  4.4  4.1   Chloride 98 - 111 mmol/L 106  108  106   CO2 22 - 32 mmol/L _1 Calcium 8.9 - 10.3 mg/dL 9.4  8.9  9.2   Total Protein 6.5 - 8.1 g/dL 6.7   6.6   Total Bilirubin 0.3 - 1.2 mg/dL 1.1   0.8   Alkaline Phos 38 - 126 U/L 30   33   AST 15 - 41 U/L 19   18   ALT 0 - 44 U/L 19   14     RADIOGRAPHIC STUDIES: No results  found.  ASSESSMENT & PLAN [name] 71 y.o. Gary with medical history significant for mantle cell lymphoma in remission who presents for a follow up visit.  #Mantle Cell Lymphoma in Remission -- Prior PET CT scan in October 2022 showed no evidence of residual or recurrent disease, though there were some lymph nodes concerning for sarcoidosis --Patient has been remission  since at least 06/11/2015 at which time his PET CT scan showed no evidence of recurrent or residual disease. --PET CT on 10/10/2021.  Awaiting formal read.  Based on my assessment no clear evidence of relapsed/recurrent disease --Labs from today were reviewed and require no intervention. Labs show blood cell 5.0, hemoglobin 12.1, MCV 88.1, and platelets of 214 --Continue ibrutinib 420 mg p.o. daily --Return to clinic 3 months with labs.   No orders of the defined types were placed in this encounter.   All questions were answered. The patient knows to call the clinic with any problems, questions or concerns.  I have spent a total of 30 minutes minutes of face-to-face and non-face-to-face time, preparing to see the patient performing a medically appropriate examination, counseling and educating the patient, ordering tests, documenting clinical information in the electronic health record, and care coordination.   Ledell Peoples, MD Department of Hematology/Oncology [place] [facility] Phone: [phone] Pager: [phone] Email: [email or web address]  10/10/2021 11:37 AM

## 2021-10-12 ENCOUNTER — Other Ambulatory Visit (HOSPITAL_COMMUNITY): Payer: Self-pay | Admitting: Cardiology

## 2021-10-16 ENCOUNTER — Ambulatory Visit (HOSPITAL_COMMUNITY)
Admission: RE | Admit: 2021-10-16 | Discharge: 2021-10-16 | Disposition: A | Discharge status: Home / Self Care | Payer: Medicare PPO | Source: Ambulatory Visit | Attending: Family Medicine | Admitting: Family Medicine

## 2021-10-16 ENCOUNTER — Encounter (HOSPITAL_COMMUNITY): Payer: Self-pay

## 2021-10-16 VITALS — BP 108/78 | HR 59 | Wt 187.8 lb

## 2021-10-16 DIAGNOSIS — I428 Other cardiomyopathies: Secondary | ICD-10-CM | POA: Insufficient documentation

## 2021-10-16 DIAGNOSIS — I2510 Atherosclerotic heart disease of native coronary artery without angina pectoris: Secondary | ICD-10-CM | POA: Insufficient documentation

## 2021-10-16 DIAGNOSIS — I480 Paroxysmal atrial fibrillation: Secondary | ICD-10-CM | POA: Insufficient documentation

## 2021-10-16 DIAGNOSIS — Z79899 Other long term (current) drug therapy: Secondary | ICD-10-CM | POA: Insufficient documentation

## 2021-10-16 DIAGNOSIS — Z8249 Family history of ischemic heart disease and other diseases of the circulatory system: Secondary | ICD-10-CM | POA: Insufficient documentation

## 2021-10-16 DIAGNOSIS — E782 Mixed hyperlipidemia: Secondary | ICD-10-CM

## 2021-10-16 DIAGNOSIS — Z8673 Personal history of transient ischemic attack (TIA), and cerebral infarction without residual deficits: Secondary | ICD-10-CM | POA: Insufficient documentation

## 2021-10-16 DIAGNOSIS — I639 Cerebral infarction, unspecified: Secondary | ICD-10-CM

## 2021-10-16 DIAGNOSIS — I7121 Aneurysm of the ascending aorta, without rupture: Secondary | ICD-10-CM | POA: Insufficient documentation

## 2021-10-16 DIAGNOSIS — I5022 Chronic systolic (congestive) heart failure: Secondary | ICD-10-CM | POA: Diagnosis not present

## 2021-10-16 DIAGNOSIS — Q231 Congenital insufficiency of aortic valve: Secondary | ICD-10-CM | POA: Insufficient documentation

## 2021-10-16 DIAGNOSIS — Z7901 Long term (current) use of anticoagulants: Secondary | ICD-10-CM | POA: Insufficient documentation

## 2021-10-16 DIAGNOSIS — I6523 Occlusion and stenosis of bilateral carotid arteries: Secondary | ICD-10-CM

## 2021-10-16 DIAGNOSIS — E785 Hyperlipidemia, unspecified: Secondary | ICD-10-CM | POA: Insufficient documentation

## 2021-10-16 NOTE — Progress Notes (Signed)
PCP: Shelda Pal, DO Cardiology: Dr. Debara Pickett HF Cardiology: Dr. Aundra Dubin  71 y.o. with history of mantle cell lymphoma, CVA, PACs, and chronic systolic CHF was referred by Dr. Debara Pickett for followup of CHF.  Patient was diagnosed with mantle cell lymphoma in 2017 and started CHOP-R in 6/17.  He is currently on ibrutinib with good control of lymphoma.  In 6/21, he was admitted with CVA.  Imaging showed acute right pontine infarct but also chronic right mid frontal lobe infarct and superior left cerebellar infarct.  Carotid dopplers showed moderate LICA stenosis, but carotid stenosis was not thought to be the etiology of CVAs.  He worse a Zio patch for 2 wks, showing frequent PACs but no AF.   During the 6/21 hospitalization, echo showed EF 35-40%.  Coronary CT angiogram in 8/21 showed mild nonobstructive disease.  Most recent echo in 5/22 showed further deterioration in LV function with EF 25-30%, moderate LV dilation, and normal RV.    Cardiac MRI in 9/22 showed LV EF 43%, RV EF 48%, small areas of mid-wall LGE in the basal inferior and basal inferoseptal walls.   Atrial flutter has been noted, he is now on Eliquis.   Echo 3/23 showed EF 45-50%, mild global hypokinesis, normal RV, normal IVC, bicuspid aortic valve with mild AI.   Today he returns for HF follow up. Overall feeling fine. Works out 6 days a week (lifts weights), push mows his grass and does his children's yards too. He is not short of breath with working out. Occasional light headedness if he stands up too fast, no falls. Denies palpitations, abnormal bleeding, CP,edema, or PND/Orthopnea. Appetite ok. No fever or chills. Weight stable. Taking all medications.   Labs (7/22): LDL 55, K 4.1, creatinine 1.17, LDL 55 Labs (8/22): K 4.9, creatinine 1.3 Labs (10/22): hgb 14, K 3.8, creatinine 1.7 Labs (12/22): LDL 61 Labs (3/23): K 4.3, creatinine 1.23, LFTs normal, hgb 12.3 Labs (9/23): K 4.5, creatinine 1.29  ECG (personally  reviewed): SB with 1st degree AVB, 59 bpm, lateral TWI  PMH: 1. Non-hodgkin's lymphoma: Mantle cell lymphoma.  He was treated with CHOP-R starting 6/17.  Currently on ibrutinib.  2. Carotid stenosis: Carotid dopplers (0/97) with 35-32% LICA stenosis.  - Carotid dopplers (9/92): 42-68% LICA.  3. CVA (6/21): Acute right pontine infarct but also with chronic right mid frontal lobe infarct and superior left cerebellar infarct.  Carotid stenosis was not thought to be the etiology.   - 2 wk Zio patch in 7/21 showed frequent PACs but no atrial fibrillation.  4. PACs: Frequent.  5. 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  - Echo (6/21): EF 35-40% - Coronary CT angiography (8/21): mild nonobstructive CAD, ascending aorta 4.3 cm - Echo (5/22): EF 25-30%, moderate LV dilation, mild AI, normal RV.  - cMRI (9/22): LV EF 43%, RV EF 48%, small areas of mid-wall LGE in the basal inferior and basal inferoseptal walls. - Echo (3/23): EF 45-50%, mild global hypokinesis, normal RV, normal IVC, bicuspid aortic valve with mild AI.  6. 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  7. Duodenal ulcer with GI bleeding 8. Atrial flutter: Paroxysmal.  9. Functionally bicuspid aortic valve: Mild AI on 3/23 echo.   Social History   Socioeconomic History   Marital status: Married    Spouse name: Not on file   Number of children: Not on file   Years of education: Not on file   Highest education level: Not on file  Occupational History   Occupation: Retired  Tobacco Use   Smoking status: Never   Smokeless tobacco: Never  Vaping Use   Vaping Use: Never used  Substance and Sexual Activity   Alcohol use: Not Currently   Drug use: No   Sexual activity: Not on file  Other Topics Concern   Not on file  Social History Narrative   Not on file   Social Determinants of Health   Financial Resource Strain: Low Risk  (04/02/2021)   Overall Financial Resource Strain (CARDIA)     Difficulty of Paying Living Expenses: Not very hard  Food Insecurity: No Food Insecurity (04/02/2021)   Hunger Vital Sign    Worried About Running Out of Food in the Last Year: Never true    Ran Out of Food in the Last Year: Never true  Transportation Needs: No Transportation Needs (04/02/2021)   PRAPARE - Hydrologist (Medical): No    Lack of Transportation (Non-Medical): No  Physical Activity: Sufficiently Active (04/02/2021)   Exercise Vital Sign    Days of Exercise per Week: 7 days    Minutes of Exercise per Session: 60 min  Stress: No Stress Concern Present (04/02/2021)   [place]    Feeling of Stress : Not at all  Social Connections: Moderately Isolated (04/02/2021)   Social Connection and Isolation Panel [NHANES]    Frequency of Communication with Friends and Family: More than three times a week    Frequency of Social Gatherings with Friends and Family: More than three times a week    Attends Religious Services: Never    Marine scientist or Organizations: No    Attends Archivist Meetings: Never    Marital Status: Married  Human resources officer Violence: Not At Risk (04/02/2021)   Humiliation, Afraid, Rape, and Kick questionnaire    Fear of Current or Ex-Partner: No    Emotionally Abused: No    Physically Abused: No    Sexually Abused: No   Family History  Problem Relation Age of Onset   Hypertension Mother    Hypertension Father    Kidney failure Father    Hypertension Sister    Diabetes Sister    Prostate cancer Brother    Lupus Sister    ROS: All systems reviewed and negative except as per HPI.  Current Outpatient Medications  Medication Sig Dispense Refill   apixaban (ELIQUIS) 5 MG TABS tablet Take 1 tablet (5 mg total) by mouth 2 (two) times daily. 60 tablet 11   atorvastatin (LIPITOR) 80 MG tablet TAKE 1 TABLET BY MOUTH EVERY DAY 90 tablet 1   carvedilol  (COREG) 12.5 MG tablet Take 1.5 tablets (18.75 mg total) by mouth 2 (two) times daily with a meal. 240 tablet 3   empagliflozin (JARDIANCE) 10 MG TABS tablet Take 1 tablet (10 mg total) by mouth daily before breakfast. 30 tablet 11   ibrutinib (IMBRUVICA) 420 MG tablet Take 1 tablet (420 mg) by mouth daily. 28 tablet 6   sacubitril-valsartan (ENTRESTO) 97-103 MG Take 1 tablet by mouth 2 (two) times daily. 180 tablet 3   spironolactone (ALDACTONE) 25 MG tablet TAKE 1 TABLET (25 MG TOTAL) BY MOUTH DAILY. 90 tablet 3   No current facility-administered medications for this encounter.   Wt Readings from Last 3 Encounters:  10/16/21 85.2 kg (187 lb 12.8 oz)  10/10/21 85.4 kg (188 lb 4.8 oz)  07/10/21 86.8 kg (  191 lb 6.4 oz)   BP 108/78   Pulse (!) 59   Wt 85.2 kg (187 lb 12.8 oz)   SpO2 97%   BMI 26.95 kg/m  Physical Exam: General:  NAD. No resp difficulty HEENT: Normal Neck: Supple. No JVD. Carotids 2+ bilat; no bruits. No lymphadenopathy or thryomegaly appreciated. Cor: PMI nondisplaced. Regular rate & rhythm. No rubs, gallops or murmurs. Lungs: Clear Abdomen: Soft, nontender, nondistended. No hepatosplenomegaly. No bruits or masses. Good bowel sounds. Extremities: No cyanosis, clubbing, rash, edema Neuro: Alert & oriented x 3, cranial nerves grossly intact. Moves all 4 extremities w/o difficulty. Affect pleasant.  Assessment/Plan: 1. Chronic systolic CHF: Nonischemic cardiomyopathy.  Coronary CTA in 8/21 with mild nonobstructive CAD.  Most recent echo in 5/22 with EF 25-30%, moderate LV dilation, normal RV.  I suspect the most likely cause of his cardiomyopathy is prior doxorubicin chemotherapy with initial treatment of his mantle cell lymphoma.  However, there have also been multiple family members with CHF, apparently of uncertain etiology so cannot rule out a role for genetic cardiomyopathy.  Cardiac MRI in 9/22 showed LV EF 43%, RV EF 48%, small areas of mid-wall LGE in the basal  inferior and basal inferoseptal walls => cannot rule out myocarditis as cause of cardiomyopathy.  Echo 3/23 showed EF 45-50%.  He is not volume overloaded, NYHA class I symptoms.   - Continue Coreg 18.75 mg bid, will not increase with bradycardia.   - Continue spironolactone 25 mg daily. Recent BMET was stable.  - Continue empagliflozin 10 mg daily.  - Continue Entresto 97/103 mg bid.  - EF is out of range for ICD.  2. CVA: Patient had acute CVA in 6/21, imaging also showed evidence for prior CVAs as well.  He had moderate LICA stenosis but this was thought to be a bystander by neurology.  CVA likely related to atrial fibrillation.  - Continue Eliquis. No bleeding issues. 3. Carotid stenosis: Moderate LICA stenosis, repeat carotid dopplers. 4. Hyperlipidemia: Goal LDL < 70.  - Continue atorvastatin, good lipids in 12/22.  5. Atrial fibrillation: Paroxysmal.  NSR today.  - Continue apixaban.  6. Functionally bicuspid aortic valve: Echo 3/23 showed no AS, mild AI.  7. Ascending aortic aneurysm: 4.3 cm ascending aorta on CTA in 2021.  Stable 4.2 cm on CTA 4/23 - Repeat in 1 year (4/24).  Follow up in 6 months with Dr. Aundra Dubin.  Maricela Bo [facility] FNP-BC 10/16/2021

## 2021-10-16 NOTE — Patient Instructions (Signed)
It was great to see you today! No medication changes are needed at this time.   Your physician has requested that you have a carotid duplex. This test is an ultrasound of the carotid arteries in your neck. It looks at blood flow through these arteries that supply the brain with blood. Allow one hour for this exam. There are no restrictions or special instructions. -they will contact you with an appointment  Your physician wants you to follow-up in: 6 months with Dr Kendall Flack will receive a reminder letter in the mail two months in advance. If you don't receive a letter, please call our office to schedule the follow-up appointment.   Do the following things EVERYDAY: Weigh yourself in the morning before breakfast. Write it down and keep it in a log. Take your medicines as prescribed Eat low salt foods--Limit salt (sodium) to 2000 mg per day.  Stay as active as you can everyday Limit all fluids for the day to less than 2 liters  At the [facility], you and your health needs are our priority. As part of our continuing mission to provide you with exceptional heart care, we have created designated Provider Care Teams. These Care Teams include your primary Cardiologist (physician) and Advanced Practice Providers (APPs- Physician Assistants and Nurse Practitioners) who all work together to provide you with the care you need, when you need it.   You may see any of the following providers on your designated Care Team at your next follow up: Dr Glori Bickers Dr Loralie Champagne Dr. Roxana Hires, NP Lyda Jester, [place] [facility] [place], [place] Forestine Na, NP Audry Riles, PharmD   Please be sure to bring in all your medications bottles to every appointment.   If you have any questions or concerns before your next appointment please send [place] a message through [place] or call our office at [phone].    TO LEAVE A MESSAGE FOR THE NURSE SELECT  OPTION 2, PLEASE LEAVE A MESSAGE INCLUDING: YOUR NAME DATE OF BIRTH CALL BACK NUMBER REASON FOR CALL**this is important as we prioritize the call backs  YOU WILL RECEIVE A CALL BACK THE SAME DAY AS LONG AS YOU CALL BEFORE 4:00 PM

## 2021-10-25 ENCOUNTER — Other Ambulatory Visit (HOSPITAL_COMMUNITY): Payer: Self-pay

## 2021-10-25 ENCOUNTER — Telehealth: Payer: Self-pay

## 2021-10-25 NOTE — Telephone Encounter (Signed)
Oral Oncology Patient Advocate Encounter   Began application for assistance for Imbruvica through [employer].   Application will be submitted upon completion of necessary supporting documentation.   [employer] phone number [phone].   I will continue to check the status until final determination.   Berdine Addison, [facility] Patient [place]  [phone] (phone) [phone] (fax)

## 2021-10-28 ENCOUNTER — Other Ambulatory Visit (HOSPITAL_COMMUNITY): Payer: Self-pay

## 2021-10-28 NOTE — Telephone Encounter (Signed)
Oral Oncology Patient Advocate Encounter  Reached out and spoke with patient regarding PAP paperwork, explained that I would send it to their preferred email via DocuSign.   Confirmed email address: [email or web address].    Patient expressed understanding and consent.  Will follow up once paperwork has been signed and returned.   Berdine Addison, [facility] Patient [place]  [phone] (phone) [phone] (fax)

## 2021-10-28 NOTE — Telephone Encounter (Signed)
Oral Oncology Patient Advocate Encounter   Submitted application for assistance for Imbruvica to [employer].   Application submitted via e-fax to [phone]   [employer]'s phone number [phone].   I will continue to check the status until final determination.   Berdine Addison, [facility] Patient [place]  [phone] (phone) [phone] (fax)

## 2021-10-30 ENCOUNTER — Other Ambulatory Visit (HOSPITAL_COMMUNITY): Payer: Self-pay

## 2021-10-30 NOTE — Telephone Encounter (Signed)
Oral Oncology Patient Advocate Encounter  Received update via fax from [place] regarding patient assistance application for Imbruvica.  Status is pending copy of insurance cards and physical patient signature.   Copy of insurance card submitted to program via e-fax [phone]  [employer] phone [phone]  I will continue to check status until final determination.  Lady Deutscher, CPhT-Adv Oncology Pharmacy Patient [place] Direct Number: [phone]  Fax: [phone]

## 2021-11-01 ENCOUNTER — Other Ambulatory Visit (HOSPITAL_COMMUNITY): Payer: Self-pay

## 2021-11-01 NOTE — Telephone Encounter (Signed)
Oral Oncology Patient Advocate Encounter   Received notification that the application for assistance for Imbruvica through [employer] has been temporarily approved pending a signed attestation that was mailed to patient. Should receive those in 7 - 10 days.    [employer]'s phone number [phone].   Effective dates: 09.27.23 through 11.26.23 (end of the year upon receipt of signature)  [employer] will be reaching out to coordinate medication delivery. [phone]  Left a voicemail with patient and waiting for call back.   Berdine Addison, [facility] Patient [place]  [phone] (phone) [phone] (fax)

## 2021-11-04 ENCOUNTER — Other Ambulatory Visit (HOSPITAL_COMMUNITY): Payer: Self-pay

## 2021-11-04 ENCOUNTER — Telehealth: Payer: Self-pay | Admitting: Pharmacy Technician

## 2021-11-04 ENCOUNTER — Encounter: Payer: Self-pay | Admitting: Oncology

## 2021-11-04 NOTE — Telephone Encounter (Signed)
Oral Oncology Patient Advocate Encounter   Was successful in securing patient a $ 10,000 grant from [employer] [place] [employer] to provide copayment coverage for his Imbruvica.  This will keep the out of pocket expense at $0.    The billing information is as follows and has been shared with [facility].   Member ID: [id number] Group ID: [id number] RxBin: [id number] Dates of Eligibility: 08/06/2021 through 11/04/2022  Fund:  [place], [place] Patient [place] Direct Number: [phone]  Fax: [phone]

## 2021-11-11 ENCOUNTER — Ambulatory Visit (HOSPITAL_COMMUNITY)
Admission: RE | Admit: 2021-11-11 | Discharge: 2021-11-11 | Disposition: A | Discharge status: Home / Self Care | Payer: Medicare PPO | Source: Ambulatory Visit | Attending: Internal Medicine | Admitting: Internal Medicine

## 2021-11-11 DIAGNOSIS — I6523 Occlusion and stenosis of bilateral carotid arteries: Secondary | ICD-10-CM | POA: Diagnosis not present

## 2021-11-12 ENCOUNTER — Telehealth (HOSPITAL_COMMUNITY): Payer: Self-pay

## 2021-11-12 NOTE — Telephone Encounter (Addendum)
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

## 2021-11-19 ENCOUNTER — Telehealth: Payer: Self-pay | Admitting: Pharmacy Technician

## 2021-11-19 ENCOUNTER — Other Ambulatory Visit (HOSPITAL_COMMUNITY): Payer: Self-pay

## 2021-11-19 ENCOUNTER — Encounter: Payer: Self-pay | Admitting: Oncology

## 2021-11-19 NOTE — Telephone Encounter (Signed)
Oral Oncology Patient Advocate Encounter  Was successful in securing patient a $8,500 grant from [employer] to provide copayment coverage for Imbruvica.  This will keep the out of pocket expense at $0.     [employer] ID: [id number]  I have spoken with the patient.   The billing information is as follows and has been shared with [facility].    RxBin: [id number] PCN: [id number] Member ID: [id number] Group ID: [id number] Dates of Eligibility: 10/20/2021 through 10/20/2022  Fund:  [place], CPhT-Adv Oncology Pharmacy Patient [place] Direct Number: [phone]  Fax: [phone]

## 2021-11-26 ENCOUNTER — Other Ambulatory Visit (HOSPITAL_COMMUNITY): Payer: Self-pay

## 2021-11-27 ENCOUNTER — Other Ambulatory Visit (HOSPITAL_COMMUNITY): Payer: Self-pay

## 2021-12-03 ENCOUNTER — Telehealth: Payer: Self-pay | Admitting: Internal Medicine

## 2021-12-03 NOTE — Telephone Encounter (Signed)
LM for patient that [place] application for Jardiance assistance has been completed - ready for pick up at [facility] office

## 2021-12-16 ENCOUNTER — Other Ambulatory Visit: Payer: Self-pay | Admitting: Cardiology

## 2021-12-16 DIAGNOSIS — I480 Paroxysmal atrial fibrillation: Secondary | ICD-10-CM

## 2021-12-16 NOTE — Telephone Encounter (Signed)
Eliquis '5mg'$  refill request received. Patient is 71 years old, weight-85.2kg, Crea-1.29 on 10/10/2021, Diagnosis-Afib & CVA, and last seen by [facility] on 10/16/2021. Dose is appropriate based on dosing criteria. Will send in refill to requested pharmacy.

## 2021-12-17 ENCOUNTER — Other Ambulatory Visit: Payer: Self-pay | Admitting: *Deleted

## 2021-12-17 ENCOUNTER — Telehealth: Payer: Self-pay | Admitting: *Deleted

## 2021-12-17 ENCOUNTER — Other Ambulatory Visit (HOSPITAL_COMMUNITY): Payer: Self-pay

## 2021-12-17 NOTE — Telephone Encounter (Signed)
Patient assistance application faxed to [employer] at [phone]

## 2021-12-17 NOTE — Telephone Encounter (Signed)
Entresto PAP left for MD to complete This has been done but patient needs to sign application  MyChart message sent about this

## 2021-12-24 ENCOUNTER — Other Ambulatory Visit (HOSPITAL_COMMUNITY): Payer: Self-pay

## 2021-12-27 ENCOUNTER — Other Ambulatory Visit (HOSPITAL_COMMUNITY): Payer: Self-pay

## 2021-12-30 ENCOUNTER — Other Ambulatory Visit (HOSPITAL_COMMUNITY): Payer: Self-pay | Admitting: Cardiology

## 2021-12-30 DIAGNOSIS — I6523 Occlusion and stenosis of bilateral carotid arteries: Secondary | ICD-10-CM

## 2022-01-09 NOTE — Telephone Encounter (Signed)
Patient assistance approved for Jardiance from 02/03/2022 - 02/03/2023

## 2022-01-13 ENCOUNTER — Other Ambulatory Visit: Payer: Self-pay | Admitting: Hematology and Oncology

## 2022-01-13 DIAGNOSIS — C8319 Mantle cell lymphoma, extranodal and solid organ sites: Secondary | ICD-10-CM

## 2022-01-13 NOTE — Progress Notes (Signed)
[facility] [phone]   [phone]  PROGRESS NOTE  Patient Care Team: Shelda Pal, DO as PCP - General (Family Medicine) Debara Pickett Nadean Corwin, MD as PCP - Cardiology (Cardiology) Vickie Epley, MD as PCP - Electrophysiology (Cardiology) Donnie Mesa, MD as Consulting Physician (General Surgery) Wilford Corner, MD as Consulting Physician (Gastroenterology) Ples Specter, MD as Referring Physician (Internal Medicine) Larina Earthly, MD as Referring Physician (Hematology and Oncology) Grace Isaac, MD (Inactive) as Consulting Physician (Cardiothoracic Surgery) Orson Slick, MD as Consulting Physician (Hematology and Oncology)  Hematological/Oncological History # Marginal Zone Lymphoma, In Remission  (a) s/p coil embolization of the feeding (gastro-duodenal) artery 01/23/2015             (b) anemia--scant iron stores on bone marrow biopsy--s/p feraheme 02/13/2015   (1) right inguinal lymph node biopsy 01/22/2015 confirms mantle cell lymphoma             (a) bone marrow biopsy 02/06/2015 positive for involvement by the patient's mantle cell lymphoma             (b) IPI score of 5 (high risk) predicts a 5 year progression free survival of 50% with CHOP-Rituxan chemotherapy             (c) MIPI score of 5 (intermediate risk) predicts a median survival of 58 months             (d) the cells were CD5 positive, CD19 and 20+, CD23 negative, lambda restricted   (2) CHOP/Rituxan started 02/09/2015, completed 8 cycles 07/05/2015; total doxorubicin dose 300 mg/M2   (3)  [facility] consultation 04/06/2015. Patient opted against transplant consolidation   (4) rituximab maintenance started 08/16/2015, repeat every 2 months, discontinued February 2019             (a) PET scan had shown a suggestion of recurrence, but see (6) below   (5) PSA increase noted July 2017, back to baseline by December 2017             (a) also noted to  be high (13.0) October 2021, resolved with antibiotics   (6) restaging PET scan 11/17/2016 is consistent with disease progression             (a) repeat PET scan 02/02/2017 confirms increase in lymph nodes             (b) PET scan 04/20/2017 shows increasing hypermetabolic adenopathy             (c) bronchoscopy/mediastinoscopy 05/05/2017 shows granulomatous disease, noncaseating, with negative AFB/ GMS stains; <10% of lymphocytes are B-cell with slight lambda excess             (d) PET scan on 07/06/2017 shows mild increase in mediastinal lymphadenopathy. Increased hypermetabolic supraclavicular lymph node.             (e) PET scan 12/24/2017 interpreted as showing evidence of partial response                    (f) PET 09/21/2018 shows decreased size and metabolism of scattered measurable nodes; Deauville 4             (g) PET scan 11/29/2019 essentially unchanged except for increased prostate activity (see #10 below).             (h) thoracic surgery (Dr. Minus Breeding) feels it would be very difficult to obtain a biopsy of the small positive areas in the  most recent scan             (i) PET scan 11/13/2020 shows decreased activity in the previously noted subcentimeter Deauville-4 mediastinal and hilar adenopathy              (7) ibrutinib 420 mg daily started 02/09/2017, discontinued March 2019             (a) resumed April 2019 at 560 mg daily             (b) dose back to 420 mg daily as of May 2020  04/09/2021: Transition care to Dr. Lorenso Courier due to Dr. Virgie Dad retirement.  Patient continuing on ibrutinib 420 mg p.o. daily  Interval History:  [name] 71 y.o. male with medical history significant for mantle cell lymphoma in remission who presents for a follow up visit. The patient's last visit was on 10/10/2021. In the interim since the last visit he has had no major changes in his health.  On exam today [name] reports he has been well overall interim since her last visit.  He is  developed no bumps or lumps concerning for lymphadenopathy.  He has no abdominal swelling or early satiety concerning for splenomegaly.  He reports he does feel more cold than usual and he thinks this is due to "thin blood".  He notes he does have some occasional blood when blowing his nose.  He reports his appetite has been good and his weight is stable.  He notes he works out approximately 5 to 6 days/week about 1 hour/day.  He normally lifts weights.  He notes he is tolerating ibrutinib well with no side effects.  He is not having any palpitations or recent infections.  He is willing and able to continue on this medication at this time.  He denies any bumps or lumps or findings concerning for lymphadenopathy or relapse disease.  He denies any fevers, chills, sweats, nausea, vomiting or diarrhea.  A full 10 point ROS is listed below.  MEDICAL HISTORY:  Past Medical History:  Diagnosis Date   Anemia    Cancer ([place])    Lymphoma   Essential hypertension    GERD (gastroesophageal reflux disease)    GI bleed    HLD (hyperlipidemia)    Weakness     SURGICAL HISTORY: Past Surgical History:  Procedure Laterality Date   ESOPHAGOGASTRODUODENOSCOPY Left 01/18/2015   Procedure: ESOPHAGOGASTRODUODENOSCOPY (EGD);  Surgeon: Wilford Corner, MD;  Location: [facility] ENDOSCOPY;  Service: Endoscopy;  Laterality: Left;   INGUINAL HERNIA REPAIR Right 01/22/2015   Procedure: RIGHT INGUINAL LYMPH NODE BX;  Surgeon: Donnie Mesa, MD;  Location: [facility];  Service: General;  Laterality: Right;   IR REMOVAL TUN ACCESS W/ PORT W/O FL MOD SED  01/24/2022   MEDIASTINOSCOPY N/A 05/05/2017   Procedure: MEDIASTINOSCOPY;  Surgeon: Grace Isaac, MD;  Location: [facility];  Service: Thoracic;  Laterality: N/A;   PORTACATH PLACEMENT  01/26/2015    power port with tip SVC/RA [place]   SKIN SURGERY     Small benign cysts over left scalp removed   VIDEO BRONCHOSCOPY WITH ENDOBRONCHIAL ULTRASOUND N/A 05/05/2017   Procedure: VIDEO  BRONCHOSCOPY WITH ENDOBRONCHIAL ULTRASOUND;  Surgeon: Grace Isaac, MD;  Location: [place];  Service: Thoracic;  Laterality: N/A;    SOCIAL HISTORY: Social History   Socioeconomic History   Marital status: Married    Spouse name: Not on file   Number of children: Not on file   Years of education:  Not on file   Highest education level: Not on file  Occupational History   Occupation: Retired  Tobacco Use   Smoking status: Never   Smokeless tobacco: Never  Vaping Use   Vaping Use: Never used  Substance and Sexual Activity   Alcohol use: Not Currently   Drug use: No   Sexual activity: Not on file  Other Topics Concern   Not on file  Social History Narrative   Not on file   Social Determinants of Health   Financial Resource Strain: Low Risk  (04/02/2021)   Overall Financial Resource Strain (CARDIA)    Difficulty of Paying Living Expenses: Not very hard  Food Insecurity: No Food Insecurity (04/02/2021)   Hunger Vital Sign    Worried About Running Out of Food in the Last Year: Never true    Ran Out of Food in the Last Year: Never true  Transportation Needs: No Transportation Needs (04/02/2021)   PRAPARE - Hydrologist (Medical): No    Lack of Transportation (Non-Medical): No  Physical Activity: Sufficiently Active (04/02/2021)   Exercise Vital Sign    Days of Exercise per Week: 7 days    Minutes of Exercise per Session: 60 min  Stress: No Stress Concern Present (04/02/2021)   [place]    Feeling of Stress : Not at all  Social Connections: Moderately Isolated (04/02/2021)   Social Connection and Isolation Panel [NHANES]    Frequency of Communication with Friends and Family: More than three times a week    Frequency of Social Gatherings with Friends and Family: More than three times a week    Attends Religious Services: Never    Marine scientist or Organizations: No     Attends Archivist Meetings: Never    Marital Status: Married  Human resources officer Violence: Not At Risk (04/02/2021)   Humiliation, Afraid, Rape, and Kick questionnaire    Fear of Current or Ex-Partner: No    Emotionally Abused: No    Physically Abused: No    Sexually Abused: No    FAMILY HISTORY: Family History  Problem Relation Age of Onset   Hypertension Mother    Hypertension Father    Kidney failure Father    Hypertension Sister    Diabetes Sister    Prostate cancer Brother    Lupus Sister     ALLERGIES:  has No Known Allergies.  MEDICATIONS:  Current Outpatient Medications  Medication Sig Dispense Refill   apixaban (ELIQUIS) 5 MG TABS tablet TAKE 1 TABLET BY MOUTH TWICE A DAY 60 tablet 5   atorvastatin (LIPITOR) 80 MG tablet TAKE 1 TABLET BY MOUTH EVERY DAY 90 tablet 1   carvedilol (COREG) 12.5 MG tablet Take 1.5 tablets (18.75 mg total) by mouth 2 (two) times daily with a meal. 240 tablet 3   empagliflozin (JARDIANCE) 10 MG TABS tablet Take 1 tablet (10 mg total) by mouth daily before breakfast. 30 tablet 11   ibrutinib (IMBRUVICA) 420 MG tablet Take 1 tablet (420 mg) by mouth daily. 28 tablet 6   sacubitril-valsartan (ENTRESTO) 97-103 MG Take 1 tablet by mouth 2 (two) times daily. 180 tablet 3   spironolactone (ALDACTONE) 25 MG tablet TAKE 1 TABLET (25 MG TOTAL) BY MOUTH DAILY. 90 tablet 3   No current facility-administered medications for this visit.    REVIEW OF SYSTEMS:   Constitutional: ( - ) fevers, ( - )  chills , ( - )  night sweats Eyes: ( - ) blurriness of vision, ( - ) double vision, ( - ) watery eyes Ears, nose, mouth, throat, and face: ( - ) mucositis, ( - ) sore throat Respiratory: ( - ) cough, ( - ) dyspnea, ( - ) wheezes Cardiovascular: ( - ) palpitation, ( - ) chest discomfort, ( - ) lower extremity swelling Gastrointestinal:  ( - ) nausea, ( - ) heartburn, ( - ) change in bowel habits Skin: ( - ) abnormal skin rashes Lymphatics: ( - ) new  lymphadenopathy, ( - ) easy bruising Neurological: ( - ) numbness, ( - ) tingling, ( - ) new weaknesses Behavioral/Psych: ( - ) mood change, ( - ) new changes  All other systems were reviewed with the patient and are negative.  PHYSICAL EXAMINATION:  Vitals:   01/15/22 0932  BP: 130/87  Pulse: 76  Resp: 15  Temp: 97.8 F (36.6 C)  SpO2: 99%     Filed Weights   01/15/22 0932  Weight: 189 lb 12.8 oz (86.1 kg)   GENERAL: Well-appearing middle-aged African-American male, alert, no distress and comfortable SKIN: skin color, texture, turgor are normal, no rashes or significant lesions EYES: conjunctiva are pink and non-injected, sclera clear LUNGS: clear to auscultation and percussion with normal breathing effort HEART: regular rate & rhythm and no murmurs and no lower extremity edema Musculoskeletal: no cyanosis of digits and no clubbing. Large mobile, soft mass on mid back most consistent with lipoma.  PSYCH: alert & oriented x 3, fluent speech NEURO: no focal motor/sensory deficits  LABORATORY DATA:  I have reviewed the data as listed    Latest Ref Rng & Units 01/15/2022    9:13 AM 10/10/2021   10:48 AM 07/10/2021    9:57 AM  CBC  WBC 4.0 - 10.5 K/uL 4.2  5.0  4.1   Hemoglobin 13.0 - 17.0 g/dL 12.4  12.1  12.2   Hematocrit 39.0 - 52.0 % 38.7  37.6  37.1   Platelets 150 - 400 K/uL 234  214  231        Latest Ref Rng & Units 01/15/2022    9:13 AM 10/10/2021   10:48 AM 07/17/2021    8:37 AM  CMP  Glucose 70 - 99 mg/dL 107  94  96   BUN 8 - 23 mg/dL _0 Creatinine 0.61 - 1.24 mg/dL 1.21  1.29  1.28   Sodium 135 - 145 mmol/L 141  139  139   Potassium 3.5 - 5.1 mmol/L 4.5  4.5  4.4   Chloride 98 - 111 mmol/L 107  106  108   CO2 22 - 32 mmol/L _1 Calcium 8.9 - 10.3 mg/dL 9.3  9.4  8.9   Total Protein 6.5 - 8.1 g/dL 6.2  6.7    Total Bilirubin 0.3 - 1.2 mg/dL 0.7  1.1    Alkaline Phos 38 - 126 U/L 31  30    AST 15 - 41 U/L 20  19    ALT 0 - 44 U/L 18   19      RADIOGRAPHIC STUDIES: IR Removal Tun Access W/ Port W/O FL  Result Date: 01/24/2022 CLINICAL DATA:  History of lymphoma. Completed chemotherapy. No longer in need of Port a Catheter. Port a catheter was placed on 01/26/2015 by Dr Annamaria Boots. The port a catheter has functioned well throughout duration of usage. There is no clinical sign or  concern of infection. EXAM: REMOVAL OF IMPLANTED TUNNELED PORT-A-CATH MEDICATIONS: None ANESTHESIA/SEDATION: None, per patient request. FLUOROSCOPY: None PROCEDURE: Informed written consent was obtained from the patient after a discussion of the risk, benefits and alternatives to the procedure. The patient was positioned supine on the fluoroscopy table and the right chest Port-A-Cath site was prepped with chlorhexidine. A sterile gown and gloves were worn during the procedure. Local anesthesia was provided with 1% lidocaine with epinephrine. A timeout was performed prior to the initiation of the procedure. An incision was made overlying the Port-A-Cath with a #15 scalpel. Utilizing sharp and blunt dissection, the Port-A-Cath was removed completely. The pocked was irrigated with sterile saline. Wound closure was performed with interrupted subcutaneous 2-0 Vicryl sutures, a running subcuticular 4-0 Vicryl, Dermabond and Steri-Strips. Dressings were applied. The patient tolerated the procedure well without immediate post procedural complication. FINDINGS: Successful removal of implant Port-A-Cath without immediate post procedural complication. IMPRESSION: Successful removal of implanted Port-A-Cath. Electronically Signed   By: Sandi Mariscal M.D.   On: 01/24/2022 11:06    ASSESSMENT & PLAN [name] 71 y.o. male with medical history significant for mantle cell lymphoma in remission who presents for a follow up visit.  #Mantle Cell Lymphoma in Remission -- Prior PET CT scan in October 2022 showed no evidence of residual or recurrent disease, though there were some  lymph nodes concerning for sarcoidosis --Patient has been remission since at least 06/11/2015 at which time his PET CT scan showed no evidence of recurrent or residual disease. --PET CT on 10/10/2021,  no clear evidence of relapsed/recurrent disease. Recommend standard CT scan q 6 months.  --Labs from today were reviewed and require no intervention. Labs show white blood cell 4.2, hemoglobin 12.4, MCV 90, and platelets of 234. --Continue ibrutinib 420 mg p.o. daily --Return to clinic 3 months with labs.   Orders Placed This Encounter  Procedures   IR Removal Tun Access W/ Port W/O FL    Standing Status:   Future    Number of Occurrences:   1    Standing Expiration Date:   01/16/2023    Order Specific Question:   Reason for exam:    Answer:   port removal, no longer needed    Order Specific Question:   Preferred Imaging Location?    Answer:   [facility]    All questions were answered. The patient knows to call the clinic with any problems, questions or concerns.  I have spent a total of 30 minutes minutes of face-to-face and non-face-to-face time, preparing to see the patient performing a medically appropriate examination, counseling and educating the patient, ordering tests, documenting clinical information in the electronic health record, and care coordination.   Ledell Peoples, MD Department of Hematology/Oncology [place] at [facility] Phone: [phone] Pager: [phone] Email: [email or web address]  01/27/2022 4:36 PM

## 2022-01-15 ENCOUNTER — Inpatient Hospital Stay: Payer: Medicare PPO | Attending: Hematology and Oncology

## 2022-01-15 ENCOUNTER — Inpatient Hospital Stay (HOSPITAL_BASED_OUTPATIENT_CLINIC_OR_DEPARTMENT_OTHER): Payer: Medicare PPO | Admitting: Hematology and Oncology

## 2022-01-15 VITALS — BP 130/87 | HR 76 | Temp 97.8°F | Resp 15 | Wt 189.8 lb

## 2022-01-15 DIAGNOSIS — Z8572 Personal history of non-Hodgkin lymphomas: Secondary | ICD-10-CM | POA: Insufficient documentation

## 2022-01-15 DIAGNOSIS — C8319 Mantle cell lymphoma, extranodal and solid organ sites: Secondary | ICD-10-CM

## 2022-01-15 DIAGNOSIS — Z08 Encounter for follow-up examination after completed treatment for malignant neoplasm: Secondary | ICD-10-CM | POA: Insufficient documentation

## 2022-01-15 LAB — CBC WITH DIFFERENTIAL (CANCER CENTER ONLY)
Abs Immature Granulocytes: 0.02 10*3/uL (ref 0.00–0.07)
Basophils Absolute: 0 10*3/uL (ref 0.0–0.1)
Basophils Relative: 1 %
Eosinophils Absolute: 0 10*3/uL (ref 0.0–0.5)
Eosinophils Relative: 1 %
HCT: 38.7 % — ABNORMAL LOW (ref 39.0–52.0)
Hemoglobin: 12.4 g/dL — ABNORMAL LOW (ref 13.0–17.0)
Immature Granulocytes: 1 %
Lymphocytes Relative: 25 %
Lymphs Abs: 1 10*3/uL (ref 0.7–4.0)
MCH: 28.8 pg (ref 26.0–34.0)
MCHC: 32 g/dL (ref 30.0–36.0)
MCV: 90 fL (ref 80.0–100.0)
Monocytes Absolute: 0.7 10*3/uL (ref 0.1–1.0)
Monocytes Relative: 17 %
Neutro Abs: 2.4 10*3/uL (ref 1.7–7.7)
Neutrophils Relative %: 55 %
Platelet Count: 234 10*3/uL (ref 150–400)
RBC: 4.3 MIL/uL (ref 4.22–5.81)
RDW: 13.6 % (ref 11.5–15.5)
WBC Count: 4.2 10*3/uL (ref 4.0–10.5)
nRBC: 0 % (ref 0.0–0.2)

## 2022-01-15 LAB — CMP (CANCER CENTER ONLY)
ALT: 18 U/L (ref 0–44)
AST: 20 U/L (ref 15–41)
Albumin: 4.2 g/dL (ref 3.5–5.0)
Alkaline Phosphatase: 31 U/L — ABNORMAL LOW (ref 38–126)
Anion gap: 4 — ABNORMAL LOW (ref 5–15)
BUN: 15 mg/dL (ref 8–23)
CO2: 30 mmol/L (ref 22–32)
Calcium: 9.3 mg/dL (ref 8.9–10.3)
Chloride: 107 mmol/L (ref 98–111)
Creatinine: 1.21 mg/dL (ref 0.61–1.24)
GFR, Estimated: >60 mL/min (ref >60)
Glucose, Bld: 107 mg/dL — ABNORMAL HIGH (ref 70–99)
Potassium: 4.5 mmol/L (ref 3.5–5.1)
Sodium: 141 mmol/L (ref 135–145)
Total Bilirubin: 0.7 mg/dL (ref 0.3–1.2)
Total Protein: 6.2 g/dL — ABNORMAL LOW (ref 6.5–8.1)

## 2022-01-15 LAB — LACTATE DEHYDROGENASE: LDH: 206 U/L — ABNORMAL HIGH (ref 98–192)

## 2022-01-21 ENCOUNTER — Other Ambulatory Visit: Payer: Self-pay

## 2022-01-23 ENCOUNTER — Other Ambulatory Visit: Payer: Self-pay | Admitting: Student

## 2022-01-24 ENCOUNTER — Ambulatory Visit (HOSPITAL_COMMUNITY)
Admission: RE | Admit: 2022-01-24 | Discharge: 2022-01-24 | Disposition: A | Discharge status: Home / Self Care | Payer: Medicare PPO | Source: Ambulatory Visit | Attending: Hematology and Oncology | Admitting: Hematology and Oncology

## 2022-01-24 DIAGNOSIS — Z452 Encounter for adjustment and management of vascular access device: Secondary | ICD-10-CM | POA: Diagnosis not present

## 2022-01-24 DIAGNOSIS — C8319 Mantle cell lymphoma, extranodal and solid organ sites: Secondary | ICD-10-CM | POA: Insufficient documentation

## 2022-01-24 HISTORY — PX: IR REMOVAL TUN ACCESS W/ PORT W/O FL MOD SED: IMG2290

## 2022-01-24 MED ORDER — LIDOCAINE-EPINEPHRINE 1 %-1:100000 IJ SOLN
INTRAMUSCULAR | Status: AC
  Filled 2022-01-24: qty 1

## 2022-01-24 NOTE — Procedures (Signed)
Pre Procedural Dx: Completed Chemotherapy Post Procedural Dx: Same  Successful removal of anterior chest wall port-a-cath.  EBL: Trace No immediate post procedural complications.   Ronny Bacon, MD Pager #: [phone]

## 2022-01-27 ENCOUNTER — Encounter: Payer: Self-pay | Admitting: Oncology

## 2022-02-04 ENCOUNTER — Other Ambulatory Visit (HOSPITAL_COMMUNITY): Payer: Self-pay

## 2022-02-06 ENCOUNTER — Telehealth: Payer: Self-pay | Admitting: Pharmacy Technician

## 2022-02-06 ENCOUNTER — Encounter: Payer: Self-pay | Admitting: Oncology

## 2022-02-06 ENCOUNTER — Other Ambulatory Visit (HOSPITAL_COMMUNITY): Payer: Self-pay

## 2022-02-06 NOTE — Telephone Encounter (Signed)
Oral Oncology Patient Advocate Encounter   Was successful in securing patient an $3,250 grant from Patient [place] [facility]) to provide copayment coverage for Imbruvica.  This will keep the out of pocket expense at $0.     I have spoken with the patient.    The billing information is as follows and has been shared with [place].   Member ID: [id number] Group ID: [id number] RxBin: [id number] Dates of Eligibility: 11/08/21 through 11/08/22  Fund:  [place], [place] Patient [place] Direct Number: [phone]  Fax: [phone]

## 2022-02-10 ENCOUNTER — Telehealth: Payer: Self-pay | Admitting: Hematology and Oncology

## 2022-02-10 ENCOUNTER — Other Ambulatory Visit: Payer: Self-pay

## 2022-02-10 NOTE — Telephone Encounter (Signed)
Called patient to r/s 3/11 appointment due to provider PAL. Patient r/s and notified.

## 2022-02-20 ENCOUNTER — Other Ambulatory Visit (HOSPITAL_COMMUNITY): Payer: Self-pay | Admitting: Cardiology

## 2022-02-28 ENCOUNTER — Other Ambulatory Visit: Payer: Self-pay | Admitting: Family Medicine

## 2022-02-28 NOTE — Telephone Encounter (Signed)
Called the patient to inform he needs an appointment with PCP to continue refills Last OV was 08/08/2020, He will let his wife take care of when he gets home,

## 2022-02-28 NOTE — Telephone Encounter (Signed)
Appointment scheduled for 03/03/2022 Sent in refill

## 2022-03-03 ENCOUNTER — Ambulatory Visit (INDEPENDENT_AMBULATORY_CARE_PROVIDER_SITE_OTHER): Payer: Medicare PPO | Admitting: Family Medicine

## 2022-03-03 ENCOUNTER — Encounter: Payer: Self-pay | Admitting: Family Medicine

## 2022-03-03 VITALS — BP 120/82 | HR 60 | Temp 99.1°F | Ht 70.0 in | Wt 187.0 lb

## 2022-03-03 DIAGNOSIS — Z1211 Encounter for screening for malignant neoplasm of colon: Secondary | ICD-10-CM

## 2022-03-03 DIAGNOSIS — E782 Mixed hyperlipidemia: Secondary | ICD-10-CM

## 2022-03-03 LAB — COMPREHENSIVE METABOLIC PANEL
ALT: 14 U/L (ref 0–53)
AST: 15 U/L (ref 0–37)
Albumin: 4 g/dL (ref 3.5–5.2)
Alkaline Phosphatase: 42 U/L (ref 39–117)
BUN: 18 mg/dL (ref 6–23)
CO2: 27 mEq/L (ref 19–32)
Calcium: 8.4 mg/dL (ref 8.4–10.5)
Chloride: 104 mEq/L (ref 96–112)
Creatinine, Ser: 1.23 mg/dL (ref 0.40–1.50)
GFR: 60.03 mL/min (ref >60.00)
Glucose, Bld: 88 mg/dL (ref 70–99)
Potassium: 4.2 mEq/L (ref 3.5–5.1)
Sodium: 140 mEq/L (ref 135–145)
Total Bilirubin: 0.7 mg/dL (ref 0.2–1.2)
Total Protein: 6.1 g/dL (ref 6.0–8.3)

## 2022-03-03 LAB — LIPID PANEL
Cholesterol: 122 mg/dL (ref 0–200)
HDL: 31.1 mg/dL — ABNORMAL LOW (ref >39.00)
LDL Cholesterol: 75 mg/dL (ref 0–99)
NonHDL: 91.36
Total CHOL/HDL Ratio: 4
Triglycerides: 83 mg/dL (ref 0.0–149.0)
VLDL: 16.6 mg/dL (ref 0.0–40.0)

## 2022-03-03 MED ORDER — ATORVASTATIN CALCIUM 80 MG PO TABS: 80.0000 mg | ORAL_TABLET | Freq: Every day | ORAL | 3 refills | Status: DC

## 2022-03-03 NOTE — Patient Instructions (Addendum)
Give [place] 2-3 business days to get the results of your labs back.   Keep the diet clean and stay active.  Go to your pharmacy to ask about a tetanus booster.   Let us know if you need anything.

## 2022-03-03 NOTE — Progress Notes (Signed)
Chief Complaint  Patient presents with   Follow-up    Needs Refills    Subjective: Hyperlipidemia Patient presents for Hyperlipidemia follow up. Currently taking Lipitor 80 mg/d and compliance with treatment thus far has been good. He denies myalgias. He is adhering to a healthy diet. Exercise: lifting wts The patient is not known to have coexisting coronary artery disease.  Past Medical History:  Diagnosis Date   Anemia    Cancer ([place])    Lymphoma   Essential hypertension    GERD (gastroesophageal reflux disease)    GI bleed    HLD (hyperlipidemia)    Weakness     Objective: BP 120/82 (BP Location: Left Arm, Patient Position: Sitting, Cuff Size: Normal)   Pulse 60   Temp 99.1 F (37.3 C) (Oral)   Ht '5\' 10"'$  (1.778 m)   Wt 187 lb (84.8 kg)   SpO2 94%   BMI 26.83 kg/m  General: Awake, appears stated age HEENT: MMM Heart: RRR, no LE edema, no bruits Lungs: CTAB, no rales, wheezes or rhonchi. No accessory muscle use Psych: Age appropriate judgment and insight, normal affect and mood  Assessment and Plan: Mixed hyperlipidemia - Plan: Lipid panel, Comprehensive metabolic panel  Screen for colon cancer - Plan: Ambulatory referral to Gastroenterology  Chronic, stable. Cont Lipitor 80 mg/d. Counseled on diet/exercise. Ck labs.  Refer GI for CCS.  Rec'd he get the tetanus booster at his pharmacy. F/u in 6 mo. The patient voiced understanding and agreement to the plan.  [place], DO 03/03/22  1:57 PM

## 2022-03-05 ENCOUNTER — Other Ambulatory Visit (HOSPITAL_COMMUNITY): Payer: Self-pay

## 2022-03-11 ENCOUNTER — Other Ambulatory Visit: Payer: Self-pay

## 2022-03-13 ENCOUNTER — Encounter: Payer: Self-pay | Admitting: Physician Assistant

## 2022-03-26 ENCOUNTER — Telehealth: Payer: Self-pay | Admitting: Family Medicine

## 2022-03-26 NOTE — Telephone Encounter (Signed)
Contacted [name] to schedule their annual wellness visit. Patient declined to schedule AWV at this time.  REQ CB this afternoon  [place] [facility] Direct Dial: [phone]

## 2022-04-03 ENCOUNTER — Other Ambulatory Visit (HOSPITAL_COMMUNITY): Payer: Self-pay

## 2022-04-03 ENCOUNTER — Other Ambulatory Visit: Payer: Self-pay

## 2022-04-03 ENCOUNTER — Other Ambulatory Visit: Payer: Self-pay | Admitting: Hematology and Oncology

## 2022-04-03 MED ORDER — IBRUTINIB 420 MG PO TABS
420.0000 mg | ORAL_TABLET | Freq: Every day | ORAL | 6 refills | Status: DC
  Filled 2022-04-03: qty 28, 28d supply, fill #0
  Filled 2022-05-06: qty 28, 28d supply, fill #1
  Filled 2022-06-05: qty 28, 28d supply, fill #2
  Filled 2022-07-09: qty 28, 28d supply, fill #3
  Filled 2022-08-04: qty 28, 28d supply, fill #4
  Filled 2022-09-04: qty 28, 28d supply, fill #5
  Filled 2022-10-02: qty 28, 28d supply, fill #6

## 2022-04-08 ENCOUNTER — Other Ambulatory Visit (HOSPITAL_COMMUNITY): Payer: Self-pay

## 2022-04-13 NOTE — Progress Notes (Unsigned)
Cardiology Clinic Note   Date: 04/14/2022 ID: [name], [name] 08/08/1950, MRN [id number]  Primary Cardiologist:  Pixie Casino, MD  Patient Profile    [name] is a 72 y.o. male who presents to the clinic today for routine follow-up of chronic cardiac conditions.  Past medical history significant for: Nonobstructive CAD. Coronary CTA 09/09/2019: Calcium score 145 (78th percentile).  Mild plaque in RCA and minimal plaque in LAD and LCx.  Recommend aggressive risk factor modifications. Chronic systolic heart failure. Cardiac MRI 10/16/2020: Mildly dilated LV with EF 43%, diffuse hypokinesis.  Small areas of mid wall late gadolinium enhancement in the basal inferior and basal inferoseptal walls, possible prior myocarditis consider sarcoidosis. Echo 04/16/2021: EF 45 to 50%.  Global hypokinesis.  Mild LVH.  Grade I DD.  Mild BAE.  Mild MR.  Aortic valve is functionally bicuspid with fused left and noncoronary cusps, moderate calcification, mild AR.  Mild dilatation of the aortic root 38 mm. PAF. 7-day ZIO 08/15/2019: Sinus rhythm with frequent atrial ectopy (14% PACs), numerous runs of SVT and possibly NSVT versus SVT with aberrancy.  No A-fib. Review of 7-day ZIO by Dr. Quentin Ore 11/19/2020: Evidence of atrial flutter and possible atrial fibrillation. Carotid artery disease. Carotid ultrasound 11/11/2021: Right ICA 1 to 39%.  Left ICA 60 to 79%.  Stable compared to prior exam.  Recommend repeat 1 year. Ascending aortic aneurysm. MRA chest with/without 05/29/2021: Stable mild aneurysmal dilation of the ascending thoracic aorta with maximal diameter 4.2 cm.  Recommend annual imaging. Hypertension. Hyperlipidemia. Lipid panel 03/03/2022 LDL 75, HDL 31, TG 83, total 122. CVA. 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  Small remote cortical infarct involving the high anterior right frontal lobe corresponding with abnormality seen on prior  CT.  Few additional scattered small remote bilateral cerebellar infarcts.  No large vessel occlusion, hemodynamically significant stenosis, or other acute vascular abnormality. 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  Controlled on ibrutinib. Duodenal ulcer with GI bleeding.   History of Present Illness    [name] was first evaluated by Dr. Debara Pickett on 08/03/2019 during hospitalization for CVA.  He was found to have moderate carotid stenosis on the left and new cardiomyopathy with an EF of 35 to 40%.  He underwent coronary CTA for ischemic evaluation secondary to cardiovascular disease, hypertension, and uncontrolled dyslipidemia.  It was thought that cardiomyopathy may be related to prior chemotherapy.  Patient had frequent PACs and PVCs during echo.  Patient was transitioned off lisinopril to [facility] and started on carvedilol.  LifeVest was not indicated at that time.  Repeat echo May 2022 showed EF 25 to 30%, global hypokinesis, Grade I DD, mild LAE, mild to moderate aortic valve sclerosis/calcification without stenosis, mild dilatation of aortic root 41 mm and mild dilatation of ascending aorta 42 mm.  Patient was evaluated by Dr. Aundra Dubin advanced heart failure clinic on 08/23/2020.  At that time patient reported he was able to jog 2 miles without dyspnea, lift weights most days without symptoms.  He denied orthopnea or PND.  Patient underwent cardiac MRI as outlined above.  Patient was evaluated by Dr. Quentin Ore at the request of Dr. Aundra Dubin for possible loop recorder implantation.  Dr. Quentin Ore reviewed 7-day ZIO and felt there was evidence of atrial arrhythmias.  Aspirin was discontinued.  And patient was started on Eliquis.  Was last seen by Dr. Debara Pickett on 02/18/2021 for routine follow-up.  He  was provided with patient assistance forms for Eliquis.  No other changes were made.  Patient was last seen by Allena Katz, FNP in the advanced heart failure  clinic 10/16/2021 for follow-up.  No changes were made at that time.  Today, patient companied by his wife.  He reports overall he is doing very well.  He does report some positional dizziness particularly when he is laying on his recliner and then gets up to walk around house.  His wife states he is not jumping up and going he is getting up slowly but still becoming dizzy.  He has not fallen but they are concerned because he is on Eliquis.  Patient is very active working out 5 days a week lifting weights and doing yard work. Patient denies shortness of breath or dyspnea on exertion. No chest pain, pressure, or tightness. Denies lower extremity edema, orthopnea, or PND. No palpitations.  He denies blood in stool or urine or any other bleeding issues.  His wife is very concerned because he seems to be having a lot of memory issues.  She reports they can have an entire conversation and 5 minutes later he is asking her questions pertaining to the conversation they just had like a never occurred.  She states she noticed this when he had a CVA in 2021 but it seems to be progressing.  Patient mentions that he has GI appointment coming up so he can be set up for a colonoscopy.    ROS: All other systems reviewed and are otherwise negative except as noted in History of Present Illness.  Studies Reviewed    ECG not ordered today.  Risk Assessment/Calculations     CHA2DS2-VASc Score = 6   This indicates a 9.7% annual risk of stroke. The patient's score is based upon: CHF History: 1 HTN History: 1 Diabetes History: 0 Stroke History: 2 Vascular Disease History: 1 Age Score: 1 Gender Score: 0            Revised Cardiac Risk Index (RCRI)  High Risk Surgery?NoHistory of Ischemic Heart Disease?NoHistory of Congestive Heart Failure?YesHistory of Cerebrovascular Disease?YesPre-Op Treatment w/ Insulin?NoPre-Operative Creatinine > 2 mg/dL?NoTotal Points2Perioperative Risk of Major Cardiac Event is  (%)6.6  Physical Exam    VS:  BP 108/62 (BP Location: Left Arm, Patient Position: Sitting, Cuff Size: Large)   Pulse 61   Ht '5\' 10"'$  (1.778 m)   Wt 189 lb 12.8 oz (86.1 kg)   SpO2 95%   BMI 27.23 kg/m  , BMI Body mass index is 27.23 kg/m.  GEN: Well nourished, well developed, in no acute distress. Neck: No JVD or carotid bruits. Cardiac:  RRR. No murmurs. No rubs or gallops.   Respiratory:  Respirations regular and unlabored. Clear to auscultation without rales, wheezing or rhonchi. GI: Soft, nontender, nondistended. Extremities: Radials/DP/PT 2+ and equal bilaterally. No clubbing or cyanosis. No edema.  Skin: Warm and dry, no rash. Neuro: Strength intact.  Assessment & Plan    Nonobstructive CAD.  Coronary CTA August 2021 showed calcium score 145 with mild plaque in the RCA and minimal plaque in LAD and LCx.  Patient denies chest pain, tightness, or pressure.  Very active lifting weights 5 days a week and doing yard work.  Continue Eliquis, atorvastatin, carvedilol (see #2). Chronic systolic heart failure.  Echo March 2023 showed EF 45 to 50%, global hypokinesis, mild LVH, Grade I DD.  Patient denies DOE, lower extremity edema, orthopnea, or PND.  Euvolemic and well compensated on exam.  Patient does have some positional dizziness particularly when getting up from the recliner.  Wife reports he does not jump up but does get up slowly.  Decrease carvedilol to 12.5 mg twice daily. Continue Jardiance, Entresto, and spironolactone. PAF.  Found on 7-day ZIO July 2021.  Patient denies palpitations.  No spontaneous bleeding issues.  Continue carvedilol and Eliquis. Ascending aortic aneurysm.  MRA April 2023 showed stable mild aneurysmal dilatation of the ascending thoracic aorta 4.2 cm.  Patient is due for repeat MRA in April 2024.  It appears the order is in.  Patient and wife aware that this will need to be scheduled. Hypertension.  BP today 108/62.  Patient denies headaches or dizziness.   Continue carvedilol (see #2), Entresto, spironolactone. Hyperlipidemia.  LDL January 2024 75.  Continue atorvastatin.  Followed by PCP. Mantle cell lymphoma.  Controlled on ibrutinib.  Followed by heme-onc. Preoperative risk assessment.  Patient has an upcoming GI appointment to schedule colonoscopy.  RCRI 6.6%.  Patient is able to achieve >4.0 METS with activity such as weightlifting 5 days a week and doing yard work. Based on ACC/AHA guidelines, BRIAR STPIERRE would be at acceptable risk for the planned procedure without further cardiovascular testing.  Per Pharm.D. through secure chat: "Pt with history of CVA 07/2019. Scan showed right pontine CVA and a small, old, Rt frontal CVA. Monitor showed no indication of AF. CrCl 69 and platelet count 234. Would suggest 1 day hold on Eliquis."   Disposition: Decrease carvedilol to 12.5 mg twice daily.  Neurology referral for memory loss.  Follow-up in 1 year or sooner as needed.        Signed, Justice Britain. Sander Remedios, DNP, NP-C

## 2022-04-14 ENCOUNTER — Encounter: Payer: Self-pay | Admitting: Student

## 2022-04-14 ENCOUNTER — Ambulatory Visit: Payer: Medicare PPO | Admitting: Hematology and Oncology

## 2022-04-14 ENCOUNTER — Other Ambulatory Visit: Payer: Medicare PPO

## 2022-04-14 ENCOUNTER — Ambulatory Visit: Payer: Medicare PPO | Attending: Student | Admitting: Student

## 2022-04-14 VITALS — BP 108/62 | HR 61 | Ht 70.0 in | Wt 189.8 lb

## 2022-04-14 DIAGNOSIS — I10 Essential (primary) hypertension: Secondary | ICD-10-CM | POA: Diagnosis not present

## 2022-04-14 DIAGNOSIS — Z8673 Personal history of transient ischemic attack (TIA), and cerebral infarction without residual deficits: Secondary | ICD-10-CM

## 2022-04-14 DIAGNOSIS — I480 Paroxysmal atrial fibrillation: Secondary | ICD-10-CM | POA: Diagnosis not present

## 2022-04-14 DIAGNOSIS — R413 Other amnesia: Secondary | ICD-10-CM

## 2022-04-14 DIAGNOSIS — I2510 Atherosclerotic heart disease of native coronary artery without angina pectoris: Secondary | ICD-10-CM

## 2022-04-14 DIAGNOSIS — Z01810 Encounter for preprocedural cardiovascular examination: Secondary | ICD-10-CM

## 2022-04-14 DIAGNOSIS — I7121 Aneurysm of the ascending aorta, without rupture: Secondary | ICD-10-CM | POA: Diagnosis not present

## 2022-04-14 DIAGNOSIS — I5022 Chronic systolic (congestive) heart failure: Secondary | ICD-10-CM | POA: Diagnosis not present

## 2022-04-14 MED ORDER — CARVEDILOL 12.5 MG PO TABS: 12.5000 mg | ORAL_TABLET | Freq: Two times a day (BID) | ORAL | 3 refills | Status: DC

## 2022-04-14 NOTE — Patient Instructions (Signed)
Medication Instructions:  Your physician has recommended you make the following change in your medication:  DECREASE: Carvedilol 12.'5mg'$  twice daily.  *If you need a refill on your cardiac medications before your next appointment, please call your pharmacy*   Lab Work: NONE If you have labs (blood work) drawn today and your tests are completely normal, you will receive your results only by: [place] (if you have MyChart) OR A paper copy in the mail If you have any lab test that is abnormal or we need to change your treatment, we will call you to review the results.   Testing/Procedures: NONE   Follow-Up: At [facility], you and your health needs are our priority.  As part of our continuing mission to provide you with exceptional heart care, we have created designated Provider Care Teams.  These Care Teams include your primary Cardiologist (physician) and Advanced Practice Providers (APPs -  Physician Assistants and Nurse Practitioners) who all work together to provide you with the care you need, when you need it.  We recommend signing up for the patient portal called "MyChart".  Sign up information is provided on this After Visit Summary.  MyChart is used to connect with patients for Virtual Visits (Telemedicine).  Patients are able to view lab/test results, encounter notes, upcoming appointments, etc.  Non-urgent messages can be sent to your provider as well.   To learn more about what you can do with MyChart, go to [email or web address].    Your next appointment:   1 year(s)  Provider:   Pixie Casino, MD

## 2022-04-15 ENCOUNTER — Telehealth: Payer: Self-pay

## 2022-04-15 ENCOUNTER — Encounter: Payer: Self-pay | Admitting: Physician Assistant

## 2022-04-15 ENCOUNTER — Ambulatory Visit: Payer: Medicare PPO | Admitting: Physician Assistant

## 2022-04-15 VITALS — BP 124/78 | HR 66 | Ht 70.0 in | Wt 188.0 lb

## 2022-04-15 DIAGNOSIS — Z1211 Encounter for screening for malignant neoplasm of colon: Secondary | ICD-10-CM

## 2022-04-15 MED ORDER — NA SULFATE-K SULFATE-MG SULF 17.5-3.13-1.6 GM/177ML PO SOLN
1.0000 | ORAL | 0 refills | Status: DC
Start: 2022-04-15 — End: 2022-06-20

## 2022-04-15 NOTE — Telephone Encounter (Signed)
   Patient Name: [name]  DOB: May 08, 1950 MRN: [id number]  Primary Cardiologist: Pixie Casino, MD  Clinical pharmacists have reviewed the patient's past medical history, labs, and current medications as part of preoperative protocol coverage. The following recommendations have been made:   Would prefer 1 day Eliquis hold prior to procedure given prior CVA.    I will route this recommendation to the requesting party via Epic fax function and remove from pre-op pool.  Please call with questions.  Mable Fill, Marissa Nestle, NP 04/15/2022, 4:37 PM

## 2022-04-15 NOTE — Telephone Encounter (Signed)
Pharmacy please advise on holding Eliquis prior to colonoscopy scheduled for 06/20/2022. Thank you.

## 2022-04-15 NOTE — Progress Notes (Signed)
PCP: Shelda Pal, DO Cardiology: Dr. Debara Pickett HF Cardiology: Dr. Aundra Dubin  72 y.o. with history of mantle cell lymphoma, CVA, PACs, and chronic systolic CHF was referred by Dr. Debara Pickett for followup of CHF.  Patient was diagnosed with mantle cell lymphoma in 2017 and started CHOP-R in 6/17.  He is currently on ibrutinib with good control of lymphoma.  In 6/21, he was admitted with CVA.  Imaging showed acute right pontine infarct but also chronic right mid frontal lobe infarct and superior left cerebellar infarct.  Carotid dopplers showed moderate LICA stenosis, but carotid stenosis was not thought to be the etiology of CVAs.  He worse a Zio patch for 2 wks, showing frequent PACs but no AF.   During the 6/21 hospitalization, echo showed EF 35-40%.  Coronary CT angiogram in 8/21 showed mild nonobstructive disease.  Most recent echo in 5/22 showed further deterioration in LV function with EF 25-30%, moderate LV dilation, and normal RV.    Cardiac MRI in 9/22 showed LV EF 43%, RV EF 48%, small areas of mid-wall LGE in the basal inferior and basal inferoseptal walls.   Atrial flutter has been noted, he is now on Eliquis.   Echo 3/23 showed EF 45-50%, mild global hypokinesis, normal RV, normal IVC, bicuspid aortic valve with mild AI.   Today he returns for HF follow up. Overall feeling fine. Works out 6 days a week (lifts weights), push mows his grass and does his children's yards too. He is not short of breath with working out. Occasional light headedness if he stands up too fast, no falls. Denies palpitations, abnormal bleeding, CP,edema, or PND/Orthopnea. Appetite ok. No fever or chills. Weight stable. Taking all medications.   Labs (7/22): LDL 55, K 4.1, creatinine 1.17, LDL 55 Labs (8/22): K 4.9, creatinine 1.3 Labs (10/22): hgb 14, K 3.8, creatinine 1.7 Labs (12/22): LDL 61 Labs (3/23): K 4.3, creatinine 1.23, LFTs normal, hgb 12.3 Labs (9/23): K 4.5, creatinine 1.29  ECG (personally  reviewed): SB with 1st degree AVB, 59 bpm, lateral TWI  PMH: 1. Non-hodgkin's lymphoma: Mantle cell lymphoma.  He was treated with CHOP-R starting 6/17.  Currently on ibrutinib.  2. Carotid stenosis: Carotid dopplers (99991111) with A999333 LICA stenosis.  - Carotid dopplers (123456): A999333 LICA.  3. CVA (6/21): Acute right pontine infarct but also with chronic right mid frontal lobe infarct and superior left cerebellar infarct.  Carotid stenosis was not thought to be the etiology.   - 2 wk Zio patch in 7/21 showed frequent PACs but no atrial fibrillation.  4. PACs: Frequent.  5. 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  - Echo (6/21): EF 35-40% - Coronary CT angiography (8/21): mild nonobstructive CAD, ascending aorta 4.3 cm - Echo (5/22): EF 25-30%, moderate LV dilation, mild AI, normal RV.  - cMRI (9/22): LV EF 43%, RV EF 48%, small areas of mid-wall LGE in the basal inferior and basal inferoseptal walls. - Echo (3/23): EF 45-50%, mild global hypokinesis, normal RV, normal IVC, bicuspid aortic valve with mild AI.  6. 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  7. Duodenal ulcer with GI bleeding 8. Atrial flutter: Paroxysmal.  9. Functionally bicuspid aortic valve: Mild AI on 3/23 echo.   Social History   Socioeconomic History   Marital status: Married    Spouse name: Not on file   Number of children: Not on file   Years of education: Not on file   Highest education level: Not on file  Occupational History   Occupation: Retired  Tobacco Use   Smoking status: Never   Smokeless tobacco: Never  Vaping Use   Vaping Use: Never used  Substance and Sexual Activity   Alcohol use: Never    Alcohol/week: 6.0 standard drinks of alcohol    Types: 3 Cans of beer, 3 Shots of liquor per week   Drug use: No   Sexual activity: Yes    Partners: Female  Other Topics Concern   Not on file  Social History Narrative   Not on file   Social Determinants of Health    Financial Resource Strain: Low Risk  (04/02/2021)   Overall Financial Resource Strain (CARDIA)    Difficulty of Paying Living Expenses: Not very hard  Food Insecurity: No Food Insecurity (04/02/2021)   Hunger Vital Sign    Worried About Running Out of Food in the Last Year: Never true    Ran Out of Food in the Last Year: Never true  Transportation Needs: No Transportation Needs (04/02/2021)   PRAPARE - Hydrologist (Medical): No    Lack of Transportation (Non-Medical): No  Physical Activity: Sufficiently Active (04/02/2021)   Exercise Vital Sign    Days of Exercise per Week: 7 days    Minutes of Exercise per Session: 60 min  Stress: No Stress Concern Present (04/02/2021)   [place]    Feeling of Stress : Not at all  Social Connections: Moderately Isolated (04/02/2021)   Social Connection and Isolation Panel [NHANES]    Frequency of Communication with Friends and Family: More than three times a week    Frequency of Social Gatherings with Friends and Family: More than three times a week    Attends Religious Services: Never    Marine scientist or Organizations: No    Attends Archivist Meetings: Never    Marital Status: Married  Human resources officer Violence: Not At Risk (04/02/2021)   Humiliation, Afraid, Rape, and Kick questionnaire    Fear of Current or Ex-Partner: No    Emotionally Abused: No    Physically Abused: No    Sexually Abused: No   Family History  Problem Relation Age of Onset   Hypertension Mother    Hypertension Father    Kidney failure Father    Hypertension Sister    Diabetes Sister    Lupus Sister    Prostate cancer Brother    Lung cancer Brother    Colon cancer Neg Hx    Stomach cancer Neg Hx    ROS: All systems reviewed and negative except as per HPI.  Current Outpatient Medications  Medication Sig Dispense Refill   apixaban (ELIQUIS) 5 MG TABS  tablet TAKE 1 TABLET BY MOUTH TWICE A DAY 60 tablet 5   atorvastatin (LIPITOR) 80 MG tablet Take 1 tablet (80 mg total) by mouth daily. 90 tablet 3   carvedilol (COREG) 12.5 MG tablet Take 1 tablet (12.5 mg total) by mouth 2 (two) times daily with a meal. 180 tablet 3   empagliflozin (JARDIANCE) 10 MG TABS tablet Take 1 tablet (10 mg total) by mouth daily before breakfast. 30 tablet 11   ibrutinib (IMBRUVICA) 420 MG tablet Take 1 tablet (420 mg) by mouth daily. 28 tablet 6   Na Sulfate-K Sulfate-Mg Sulf (SUPREP BOWEL PREP KIT) 17.5-3.13-1.6 GM/177ML SOLN Take 1 kit by mouth as directed. For colonoscopy prep 354 mL 0   sacubitril-valsartan (ENTRESTO) 97-103  MG Take 1 tablet by mouth 2 (two) times daily. 180 tablet 3   spironolactone (ALDACTONE) 25 MG tablet TAKE 1 TABLET (25 MG TOTAL) BY MOUTH DAILY. 90 tablet 3   No current facility-administered medications for this visit.   Wt Readings from Last 3 Encounters:  04/15/22 85.3 kg (188 lb)  04/14/22 86.1 kg (189 lb 12.8 oz)  03/03/22 84.8 kg (187 lb)   There were no vitals taken for this visit. Physical Exam: General:  NAD. No resp difficulty HEENT: Normal Neck: Supple. No JVD. Carotids 2+ bilat; no bruits. No lymphadenopathy or thryomegaly appreciated. Cor: PMI nondisplaced. Regular rate & rhythm. No rubs, gallops or murmurs. Lungs: Clear Abdomen: Soft, nontender, nondistended. No hepatosplenomegaly. No bruits or masses. Good bowel sounds. Extremities: No cyanosis, clubbing, rash, edema Neuro: Alert & oriented x 3, cranial nerves grossly intact. Moves all 4 extremities w/o difficulty. Affect pleasant.  Assessment/Plan: 1. Chronic systolic CHF: Nonischemic cardiomyopathy.  Coronary CTA in 8/21 with mild nonobstructive CAD.  Most recent echo in 5/22 with EF 25-30%, moderate LV dilation, normal RV.  I suspect the most likely cause of his cardiomyopathy is prior doxorubicin chemotherapy with initial treatment of his mantle cell lymphoma.   However, there have also been multiple family members with CHF, apparently of uncertain etiology so cannot rule out a role for genetic cardiomyopathy.  Cardiac MRI in 9/22 showed LV EF 43%, RV EF 48%, small areas of mid-wall LGE in the basal inferior and basal inferoseptal walls => cannot rule out myocarditis as cause of cardiomyopathy.  Echo 3/23 showed EF 45-50%.  He is not volume overloaded, NYHA class I symptoms.   - Continue Coreg 18.75 mg bid, will not increase with bradycardia.   - Continue spironolactone 25 mg daily. Recent BMET was stable.  - Continue empagliflozin 10 mg daily.  - Continue Entresto 97/103 mg bid.  - EF is out of range for ICD.  2. CVA: Patient had acute CVA in 6/21, imaging also showed evidence for prior CVAs as well.  He had moderate LICA stenosis but this was thought to be a bystander by neurology.  CVA likely related to atrial fibrillation.  - Continue Eliquis. No bleeding issues. 3. Carotid stenosis: Moderate LICA stenosis, repeat carotid dopplers. 4. Hyperlipidemia: Goal LDL < 70.  - Continue atorvastatin, good lipids in 12/22.  5. Atrial fibrillation: Paroxysmal.  NSR today.  - Continue apixaban.  6. Functionally bicuspid aortic valve: Echo 3/23 showed no AS, mild AI.  7. Ascending aortic aneurysm: 4.3 cm ascending aorta on CTA in 2021.  Stable 4.2 cm on CTA 4/23 - Repeat in 1 year (4/24).  Follow up in 6 months with Dr. Aundra Dubin.  Maricela Bo [facility] FNP-BC 04/15/2022

## 2022-04-15 NOTE — Telephone Encounter (Signed)
Patient with diagnosis of afib on Eliquis for anticoagulation.    Procedure: colonoscopy Date of procedure: 06/20/22  CHA2DS2-VASc Score = 6  This indicates a 9.7% annual risk of stroke. The patient's score is based upon: CHF History: 1 HTN History: 1 Diabetes History: 0 Stroke History: 2 Vascular Disease History: 1 Age Score: 1 Gender Score: 0   CrCl 24m/min Platelet count 234K  Would prefer 1 day Eliquis hold prior to procedure given prior CVA.  **This guidance is not considered finalized until pre-operative APP has relayed final recommendations.**

## 2022-04-15 NOTE — Progress Notes (Signed)
I agree with the assessment and plan as outlined by Ms. Esterwood. ?

## 2022-04-15 NOTE — Patient Instructions (Signed)
We have sent the following medications to your pharmacy for you to pick up at your convenience: [facility] have been scheduled for a colonoscopy. Please follow written instructions given to you at your visit today.  Please pick up your prep supplies at the pharmacy within the next 1-3 days. If you use inhalers (even only as needed), please bring them with you on the day of your procedure.  _______________________________________________________  If your blood pressure at your visit was 140/90 or greater, please contact your primary care physician to follow up on this.  _______________________________________________________  If you are age 70 or older, your body mass index should be between 23-30. Your Body mass index is 26.98 kg/m. If this is out of the aforementioned range listed, please consider follow up with your Primary Care Provider.  If you are age 29 or younger, your body mass index should be between 19-25. Your Body mass index is 26.98 kg/m. If this is out of the aformentioned range listed, please consider follow up with your Primary Care Provider.   ________________________________________________________  The [facility] providers would like to encourage you to use [facility] to communicate with providers for non-urgent requests or questions.  Due to long hold times on the telephone, sending your provider a message by [facility] may be a faster and more efficient way to get a response.  Please allow 48 business hours for a response.  Please remember that this is for non-urgent requests.  _______________________________________________________  Due to recent changes in healthcare laws, you may see the results of your imaging and laboratory studies on MyChart before your provider has had a chance to review them.  We understand that in some cases there may be results that are confusing or concerning to you. Not all laboratory results come back in the same time frame and the provider may  be waiting for multiple results in order to interpret others.  Please give [place] 48 hours in order for your provider to thoroughly review all the results before contacting the office for clarification of your results.   Thank you for choosing me and [facility].  Amy Esterwood, PA-C

## 2022-04-15 NOTE — Telephone Encounter (Signed)
Request for surgical clearance:     Endoscopy Procedure  What type of surgery is being performed?     Colonoscopy  When is this surgery scheduled?     06/20/22  What type of clearance is required ?   Pharmacy  Are there any medications that need to be held prior to surgery and how long? Eliquis x2 days  Practice name and name of physician performing surgery?      [facility]  What is your office phone and fax number?      Phone- [phone]  [phone]  Anesthesia type (None, local, MAC, general) ?       MAC

## 2022-04-15 NOTE — Progress Notes (Signed)
Subjective:    Patient ID: [name], male    DOB: [birth year], 72 y.o.   MRN: [id number]  HPI [name] is a pleasant 73 year old African-American male, new to GI today referred by Dr. Riki Sheer, DO for colon cancer screening.  Patient has not had a prior colonoscopy. He does have multiple comorbidities, with history of atrial fibrillation for which she is on chronic Eliquis, hypertension, history of CVA 2021, history of carotid stenosis, ascending aortic aneurysm last measured at 4.2 cm, congestive heart failure/cardiomyopathy with most recent EF in 2000 2345 to 50%, he is on Entresto.  He also has history of stage IV mantle cell lymphoma diagnosed in 2017.  He completed aggressive therapy and is currently being maintained on ibrutinib and is felt to be in remission.  He is followed by Dr. Lorenso Courier.  Patient was seen in follow-up by cardiology yesterday, felt to be stable from cardiac perspective.  Followed by Dr. Debara Pickett.  He is due for a follow-up MR angio of the chest to reevaluate his aorta and this is to be done in early April.  Patient wife also mentioned that he is to have 2 teeth extracted in early April and will also be off Eliquis for that..  Cardiology notes indicate that patient is cleared for colonoscopy, and for holding Eliquis periprocedure. He has history of an acute upper GI bleed in 2016, had undergone EGD while hospitalized per Dr. Jeralyn Bennett was found to have a massive duodenal bulb ulcer which ultimately required coil embolization for control of bleeding, and was then diagnosed with the lymphoma shortly thereafter with right inguinal lymph node biopsy.  Patient is not currently on PPI, has had no issues with upper GI symptoms in the past several years. He denies any problems with abdominal pain, no recent changes in bowel habits, no melena or hematochezia.  No family history of colon cancer that he is aware of.  Review of Systems Pertinent positive and negative review  of systems were noted in the above HPI section.  All other review of systems was otherwise negative.   Outpatient Encounter Medications as of 04/15/2022  Medication Sig   apixaban (ELIQUIS) 5 MG TABS tablet TAKE 1 TABLET BY MOUTH TWICE A DAY   atorvastatin (LIPITOR) 80 MG tablet Take 1 tablet (80 mg total) by mouth daily.   carvedilol (COREG) 12.5 MG tablet Take 1 tablet (12.5 mg total) by mouth 2 (two) times daily with a meal.   empagliflozin (JARDIANCE) 10 MG TABS tablet Take 1 tablet (10 mg total) by mouth daily before breakfast.   ibrutinib (IMBRUVICA) 420 MG tablet Take 1 tablet (420 mg) by mouth daily.   Na Sulfate-K Sulfate-Mg Sulf (SUPREP BOWEL PREP KIT) 17.5-3.13-1.6 GM/177ML SOLN Take 1 kit by mouth as directed. For colonoscopy prep   sacubitril-valsartan (ENTRESTO) 97-103 MG Take 1 tablet by mouth 2 (two) times daily.   spironolactone (ALDACTONE) 25 MG tablet TAKE 1 TABLET (25 MG TOTAL) BY MOUTH DAILY.   No facility-administered encounter medications on file as of 04/15/2022.   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/09/2015   GI bleed 02/07/2015   Symptomatic anemia 02/07/2015   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 [facility]) 01/25/2015   Acute GI bleeding    Intraabdominal mass 01/21/2015   Mediastinal mass    Essential hypertension    HLD (hyperlipidemia)    Acute blood loss anemia    Social History   Socioeconomic History   Marital status: Married     Spouse name: Not on file   Number of children: Not on file   Years of education: Not on file   Highest education level: Not on file  Occupational History   Occupation: Retired  Tobacco Use   Smoking status: Never   Smokeless tobacco: Never  Vaping Use   Vaping Use: Never used  Substance and Sexual Activity   Alcohol use: Never    Alcohol/week: 6.0 standard drinks of alcohol    Types: 3 Cans of beer, 3 Shots of liquor per week   Drug use: No   Sexual activity: Yes    Partners: Female  Other Topics Concern   Not on file  Social History Narrative   Not on file   Social Determinants of Health   Financial Resource Strain: Low Risk  (04/02/2021)   Overall Financial Resource Strain (CARDIA)    Difficulty of Paying Living Expenses: Not very hard  Food Insecurity: No Food Insecurity (04/02/2021)   Hunger Vital Sign    Worried About Running Out of Food in the Last Year: Never true    Ran Out of Food in the Last Year: Never true  Transportation Needs: No Transportation Needs (04/02/2021)   PRAPARE - Hydrologist (Medical): No    Lack of Transportation (Non-Medical): No  Physical Activity: Sufficiently Active (04/02/2021)   Exercise Vital Sign    Days of Exercise per Week: 7 days    Minutes of Exercise per Session: 60 min  Stress: No Stress Concern Present (04/02/2021)   [place]    Feeling of Stress : Not at all  Social Connections: Moderately Isolated (04/02/2021)   Social Connection and Isolation Panel [NHANES]    Frequency of Communication with Friends and Family: More than three times a week    Frequency of Social Gatherings with Friends and Family: More than three times a week    Attends Religious Services: Never    Marine scientist or Organizations: No    Attends Archivist Meetings: Never    Marital Status: Married  Human resources officer Violence: Not At Risk (04/02/2021)    Humiliation, Afraid, Rape, and Kick questionnaire    Fear of Current or Ex-Partner: No    Emotionally Abused: No    Physically Abused: No    Sexually Abused: No    [name] family history includes Diabetes in his sister; Hypertension in his father, mother, and sister; Kidney failure in his father; Lung cancer in his brother; Lupus in his sister; Prostate cancer in his brother.      Objective:    Vitals:   04/15/22 1438  BP: 124/78  Pulse: 66  SpO2: 98%    Physical Exam Well-developed well-nourished older AA male  in no acute distress.  Does not, accompanied by wife height, Weight, 188 BMI 26.98  HEENT; nontraumatic normocephalic, EOMI, PE R LA, sclera anicteric. Oropharynx; not examined today Neck; supple, no JVD Cardiovascular; regular  rate and rhythm with S1-S2, no murmur rub or gallop Pulmonary; Clear bilaterally Abdomen; soft, nontender, nondistended, no palpable mass or hepatosplenomegaly, bowel sounds are active Rectal; not done today Skin; benign exam, no jaundice rash or appreciable lesions Extremities; no clubbing cyanosis or edema skin warm and dry Neuro/Psych; alert and oriented x4, grossly nonfocal mood and affect appropriate        Assessment & Plan:   #58 72 year old African-American male, referred for colon cancer screening, no prior colonoscopy.  Patient is asymptomatic  #2 chronic anticoagulation-on Eliquis #3 history of atrial fibrillation #4.  History of CVA 2021 #5.  Carotid disease #6.  Cardiomyopathy with most recent EF 45 to 50% 2023/on Entresto #7.  History of stage IV mantle cell lymphoma diagnosed 2017 status post aggressive treatment and currently in remission on ibrutinib # 8history of massive duodenal bulb ulcer with bleed 2016-required coil embolization for control of bleeding #9 ascending aortic aneurysm-due  for follow-up imaging with MRI of the chest to be done in early April #10 hypertension  Plan; Patient will be scheduled for  colonoscopy with Dr. Christia Reading in the [facility].  Procedure was discussed in detail with the patient and his wife including indications risk and benefits and they are agreeable to proceed. Per cardiology notes from yesterday/PA with Dr. Debara Pickett patient is cleared from a cardiac perspective and to hold Eliquis.  We will also send formal communication to document.  Plan to hold Eliquis for 48 hours prior to procedure Will purposely schedule colonoscopy for late April early May, to allow him time to recover after tooth extractions and to have follow-up MR angio of the ascending aortic aneurysm to assure stability.  I have asked patient'Taylor wife to call should he have any changes in status after the follow-up MR angio.   [name] Xayvier Vallez PA-C 04/15/2022   Cc: Shelda Pal*

## 2022-04-16 ENCOUNTER — Encounter: Payer: Self-pay | Admitting: Oncology

## 2022-04-16 ENCOUNTER — Ambulatory Visit (HOSPITAL_COMMUNITY)
Admission: RE | Admit: 2022-04-16 | Discharge: 2022-04-16 | Disposition: A | Discharge status: Home / Self Care | Payer: Medicare PPO | Source: Ambulatory Visit | Attending: Family Medicine | Admitting: Family Medicine

## 2022-04-16 ENCOUNTER — Encounter (HOSPITAL_COMMUNITY): Payer: Self-pay

## 2022-04-16 VITALS — BP 128/76 | HR 52 | Wt 189.8 lb

## 2022-04-16 DIAGNOSIS — Q2381 Bicuspid aortic valve: Secondary | ICD-10-CM

## 2022-04-16 DIAGNOSIS — I480 Paroxysmal atrial fibrillation: Secondary | ICD-10-CM | POA: Diagnosis not present

## 2022-04-16 DIAGNOSIS — Z8673 Personal history of transient ischemic attack (TIA), and cerebral infarction without residual deficits: Secondary | ICD-10-CM | POA: Diagnosis not present

## 2022-04-16 DIAGNOSIS — I6523 Occlusion and stenosis of bilateral carotid arteries: Secondary | ICD-10-CM | POA: Diagnosis not present

## 2022-04-16 DIAGNOSIS — I639 Cerebral infarction, unspecified: Secondary | ICD-10-CM

## 2022-04-16 DIAGNOSIS — Z79899 Other long term (current) drug therapy: Secondary | ICD-10-CM | POA: Diagnosis not present

## 2022-04-16 DIAGNOSIS — I5022 Chronic systolic (congestive) heart failure: Secondary | ICD-10-CM

## 2022-04-16 DIAGNOSIS — R42 Dizziness and giddiness: Secondary | ICD-10-CM | POA: Insufficient documentation

## 2022-04-16 DIAGNOSIS — E782 Mixed hyperlipidemia: Secondary | ICD-10-CM | POA: Diagnosis not present

## 2022-04-16 DIAGNOSIS — I7121 Aneurysm of the ascending aorta, without rupture: Secondary | ICD-10-CM

## 2022-04-16 DIAGNOSIS — E785 Hyperlipidemia, unspecified: Secondary | ICD-10-CM | POA: Insufficient documentation

## 2022-04-16 DIAGNOSIS — Q231 Congenital insufficiency of aortic valve: Secondary | ICD-10-CM

## 2022-04-16 DIAGNOSIS — Z7901 Long term (current) use of anticoagulants: Secondary | ICD-10-CM | POA: Diagnosis not present

## 2022-04-16 DIAGNOSIS — Z8249 Family history of ischemic heart disease and other diseases of the circulatory system: Secondary | ICD-10-CM | POA: Insufficient documentation

## 2022-04-16 DIAGNOSIS — I428 Other cardiomyopathies: Secondary | ICD-10-CM | POA: Diagnosis not present

## 2022-04-16 DIAGNOSIS — I2510 Atherosclerotic heart disease of native coronary artery without angina pectoris: Secondary | ICD-10-CM | POA: Diagnosis not present

## 2022-04-16 DIAGNOSIS — Z7984 Long term (current) use of oral hypoglycemic drugs: Secondary | ICD-10-CM | POA: Insufficient documentation

## 2022-04-16 DIAGNOSIS — Z8572 Personal history of non-Hodgkin lymphomas: Secondary | ICD-10-CM | POA: Insufficient documentation

## 2022-04-16 NOTE — Patient Instructions (Signed)
It was great to see you today! No medication changes are needed at this time.   You have been referred to [facility] Radiology -they will be in contact with an appointment  Your physician recommends that you schedule a follow-up appointment in: 4 months with Dr Aundra Dubin and echo  Your physician has requested that you have an echocardiogram. Echocardiography is a painless test that uses sound waves to create images of your heart. It provides your doctor with information about the size and shape of your heart and how well your heart's chambers and valves are working. This procedure takes approximately one hour. There are no restrictions for this procedure. Please do NOT wear cologne, perfume, aftershave, or lotions (deodorant is allowed). Please arrive 15 minutes prior to your appointment time.   Do the following things EVERYDAY: Weigh yourself in the morning before breakfast. Write it down and keep it in a log. Take your medicines as prescribed Eat low salt foods--Limit salt (sodium) to 2000 mg per day.  Stay as active as you can everyday Limit all fluids for the day to less than 2 liters  At the [facility], you and your health needs are our priority. As part of our continuing mission to provide you with exceptional heart care, we have created designated Provider Care Teams. These Care Teams include your primary Cardiologist (physician) and Advanced Practice Providers (APPs- Physician Assistants and Nurse Practitioners) who all work together to provide you with the care you need, when you need it.   You may see any of the following providers on your designated Care Team at your next follow up: Dr Glori Bickers Dr Loralie Champagne Dr. Roxana Hires, NP Lyda Jester, [facility] [place], [place] Forestine Na, NP Audry Riles, PharmD   Please be sure to bring in all your medications bottles to every appointment.    Thank you for choosing  [facility]   If you have any questions or concerns before your next appointment please send [place] a message through [place] or call our office at [phone].    TO LEAVE A MESSAGE FOR THE NURSE SELECT OPTION 2, PLEASE LEAVE A MESSAGE INCLUDING: YOUR NAME DATE OF BIRTH CALL BACK NUMBER REASON FOR CALL**this is important as we prioritize the call backs  YOU WILL RECEIVE A CALL BACK THE SAME DAY AS LONG AS YOU CALL BEFORE 4:00 PM

## 2022-04-16 NOTE — Addendum Note (Signed)
Encounter addended by: Kerry Dory, CMA on: 04/16/2022 10:07 AM  Actions taken: Charge Capture section accepted

## 2022-04-17 ENCOUNTER — Other Ambulatory Visit (HOSPITAL_COMMUNITY): Payer: Self-pay | Admitting: Cardiology

## 2022-04-20 ENCOUNTER — Other Ambulatory Visit: Payer: Self-pay | Admitting: Hematology and Oncology

## 2022-04-20 DIAGNOSIS — C8319 Mantle cell lymphoma, extranodal and solid organ sites: Secondary | ICD-10-CM

## 2022-04-20 NOTE — Progress Notes (Unsigned)
[place] [facility] [phone]   [phone]  PROGRESS NOTE  Patient Care Team: Shelda Pal, DO as PCP - General (Family Medicine) Debara Pickett Nadean Corwin, MD as PCP - Cardiology (Cardiology) Vickie Epley, MD as PCP - Electrophysiology (Cardiology) Donnie Mesa, MD as Consulting Physician (General Surgery) Wilford Corner, MD as Consulting Physician (Gastroenterology) Ples Specter, MD as Referring Physician (Internal Medicine) Larina Earthly, MD as Referring Physician (Hematology and Oncology) Grace Isaac, MD (Inactive) as Consulting Physician (Cardiothoracic Surgery) Orson Slick, MD as Consulting Physician (Hematology and Oncology)  Hematological/Oncological History # Marginal Zone Lymphoma, In Remission  (a) s/p coil embolization of the feeding (gastro-duodenal) artery 01/23/2015             (b) anemia--scant iron stores on bone marrow biopsy--s/p feraheme 02/13/2015   (1) right inguinal lymph node biopsy 01/22/2015 confirms mantle cell lymphoma             (a) bone marrow biopsy 02/06/2015 positive for involvement by the patient's mantle cell lymphoma             (b) IPI score of 5 (high risk) predicts a 5 year progression free survival of 50% with CHOP-Rituxan chemotherapy             (c) MIPI score of 5 (intermediate risk) predicts a median survival of 58 months             (d) the cells were CD5 positive, CD19 and 20+, CD23 negative, lambda restricted   (2) CHOP/Rituxan started 02/09/2015, completed 8 cycles 07/05/2015; total doxorubicin dose 300 mg/M2   (3)  [facility] consultation 04/06/2015. Patient opted against transplant consolidation   (4) rituximab maintenance started 08/16/2015, repeat every 2 months, discontinued February 2019             (a) PET scan had shown a suggestion of recurrence, but see (6) below   (5) PSA increase noted July 2017, back to baseline by December 2017             (a) also noted to  be high (13.0) October 2021, resolved with antibiotics   (6) restaging PET scan 11/17/2016 is consistent with disease progression             (a) repeat PET scan 02/02/2017 confirms increase in lymph nodes             (b) PET scan 04/20/2017 shows increasing hypermetabolic adenopathy             (c) bronchoscopy/mediastinoscopy 05/05/2017 shows granulomatous disease, noncaseating, with negative AFB/ GMS stains; <10% of lymphocytes are B-cell with slight lambda excess             (d) PET scan on 07/06/2017 shows mild increase in mediastinal lymphadenopathy. Increased hypermetabolic supraclavicular lymph node.             (e) PET scan 12/24/2017 interpreted as showing evidence of partial response                    (f) PET 09/21/2018 shows decreased size and metabolism of scattered measurable nodes; Deauville 4             (g) PET scan 11/29/2019 essentially unchanged except for increased prostate activity (see #10 below).             (h) thoracic surgery (Dr. Minus Breeding) feels it would be very difficult to obtain a biopsy of the small positive areas in the  most recent scan             (i) PET scan 11/13/2020 shows decreased activity in the previously noted subcentimeter Deauville-4 mediastinal and hilar adenopathy              (7) ibrutinib 420 mg daily started 02/09/2017, discontinued March 2019             (a) resumed April 2019 at 560 mg daily             (b) dose back to 420 mg daily as of May 2020  04/09/2021: Transition care to Dr. Lorenso Courier due to Dr. Virgie Dad retirement.  Patient continuing on ibrutinib 420 mg p.o. daily  Interval History:  [name] 72 y.o. male with medical history significant for mantle cell lymphoma in remission who presents for a follow up visit. The patient's last visit was on 01/15/2022. In the interim since the last visit he has had no major changes in his health.  On exam today [name] reports he has been well overall interim since her last visit and he  "feels great".  He reports that he is planning to fertilize and mow his lawn this morning.  He has been able to stay very active.  He wakes about 4 AM every day and exercises.  He reports his energy levels are great and his appetite is strong with a stable weight.  He denies any lymphadenopathy.  He notes that he is faithfully taking his ibrutinib therapy and not having any side effects.  He denies any bleeding, bruising, or dark stools.  He has had no infections in the interim since her last visit.  Overall he is tolerating her Bruton therapy well and is willing and able to proceed at this time.  He denies any bumps or lumps or findings concerning for lymphadenopathy or relapse disease.  He denies any fevers, chills, sweats, nausea, vomiting or diarrhea.  A full 10 point ROS is listed below.  MEDICAL HISTORY:  Past Medical History:  Diagnosis Date   Anemia    Cancer ([place])    Lymphoma   Essential hypertension    GERD (gastroesophageal reflux disease)    GI bleed    HLD (hyperlipidemia)    Weakness     SURGICAL HISTORY: Past Surgical History:  Procedure Laterality Date   ESOPHAGOGASTRODUODENOSCOPY Left 01/18/2015   Procedure: ESOPHAGOGASTRODUODENOSCOPY (EGD);  Surgeon: Wilford Corner, MD;  [facility] END[facility]COPY;  Service: Endoscopy;  Laterality: Left;   INGUINAL HERNIA REPAIR Right 01/22/2015   Procedure: RIGHT INGUINAL LYMPH NODE BX;  Surgeon: Donnie Mesa, MD;  Location: [place];  Service: General;  Laterality: Right;   IR REMOVAL TUN ACCESS W/ PORT W/O FL MOD SED  01/24/2022   MEDIASTINOSCOPY N/A 05/05/2017   Procedure: MEDIASTINOSCOPY;  Surgeon: Grace Isaac, MD;  Location: [place];  Service: Thoracic;  Laterality: N/A;   PORTACATH PLACEMENT  01/26/2015    power port with tip SVC/RA [place]   SKIN SURGERY     Small benign cysts over left scalp removed   VIDEO BRONCHOSCOPY WITH ENDOBRONCHIAL ULTRASOUND N/A 05/05/2017   Procedure: VIDEO BRONCHOSCOPY WITH ENDOBRONCHIAL ULTRASOUND;   Surgeon: Grace Isaac, MD;  Location: [place];  Service: Thoracic;  Laterality: N/A;    SOCIAL HISTORY: Social History   Socioeconomic History   Marital status: Married    Spouse name: Not on file   Number of children: Not on file   Years of education: Not on file  Highest education level: Not on file  Occupational History   Occupation: Retired  Tobacco Use   Smoking status: Never   Smokeless tobacco: Never  Vaping Use   Vaping Use: Never used  Substance and Sexual Activity   Alcohol use: Never    Alcohol/week: 6.0 standard drinks of alcohol    Types: 3 Cans of beer, 3 Shots of liquor per week   Drug use: No   Sexual activity: Yes    Partners: Female  Other Topics Concern   Not on file  Social History Narrative   Not on file   Social Determinants of Health   Financial Resource Strain: Low Risk  (04/02/2021)   Overall Financial Resource Strain (CARDIA)    Difficulty of Paying Living Expenses: Not very hard  Food Insecurity: No Food Insecurity (04/02/2021)   Hunger Vital Sign    Worried About Running Out of Food in the Last Year: Never true    Ran Out of Food in the Last Year: Never true  Transportation Needs: No Transportation Needs (04/02/2021)   PRAPARE - Hydrologist (Medical): No    Lack of Transportation (Non-Medical): No  Physical Activity: Sufficiently Active (04/02/2021)   Exercise Vital Sign    Days of Exercise per Week: 7 days    Minutes of Exercise per Session: 60 min  Stress: No Stress Concern Present (04/02/2021)   [place]    Feeling of Stress : Not at all  Social Connections: Moderately Isolated (04/02/2021)   Social Connection and Isolation Panel [NHANES]    Frequency of Communication with Friends and Family: More than three times a week    Frequency of Social Gatherings with Friends and Family: More than three times a week    Attends Religious Services:  Never    Marine scientist or Organizations: No    Attends Archivist Meetings: Never    Marital Status: Married  Human resources officer Violence: Not At Risk (04/02/2021)   Humiliation, Afraid, Rape, and Kick questionnaire    Fear of Current or Ex-Partner: No    Emotionally Abused: No    Physically Abused: No    Sexually Abused: No    FAMILY HISTORY: Family History  Problem Relation Age of Onset   Hypertension Mother    Hypertension Father    Kidney failure Father    Hypertension Sister    Diabetes Sister    Lupus Sister    Prostate cancer Brother    Lung cancer Brother    Colon cancer Neg Hx    Stomach cancer Neg Hx     ALLERGIES:  has No Known Allergies.  MEDICATIONS:  Current Outpatient Medications  Medication Sig Dispense Refill   apixaban (ELIQUIS) 5 MG TABS tablet TAKE 1 TABLET BY MOUTH TWICE A DAY 60 tablet 5   atorvastatin (LIPITOR) 80 MG tablet Take 1 tablet (80 mg total) by mouth daily. 90 tablet 3   carvedilol (COREG) 12.5 MG tablet Take 1 tablet (12.5 mg total) by mouth 2 (two) times daily with a meal. 180 tablet 3   ibrutinib (IMBRUVICA) 420 MG tablet Take 1 tablet (420 mg) by mouth daily. 28 tablet 6   JARDIANCE 10 MG TABS tablet TAKE 1 TABLET BY MOUTH DAILY BEFORE BREAKFAST. 30 tablet 11   Na Sulfate-K Sulfate-Mg Sulf (SUPREP BOWEL PREP KIT) 17.5-3.13-1.6 GM/177ML SOLN Take 1 kit by mouth as directed. For colonoscopy prep (Patient not taking: Reported  on 04/16/2022) 354 mL 0   sacubitril-valsartan (ENTRESTO) 97-103 MG Take 1 tablet by mouth 2 (two) times daily. 180 tablet 3   spironolactone (ALDACTONE) 25 MG tablet TAKE 1 TABLET (25 MG TOTAL) BY MOUTH DAILY. 90 tablet 3   No current facility-administered medications for this visit.    REVIEW OF SYSTEMS:   Constitutional: ( - ) fevers, ( - )  chills , ( - ) night sweats Eyes: ( - ) blurriness of vision, ( - ) double vision, ( - ) watery eyes Ears, nose, mouth, throat, and face: ( - ) mucositis, ( -  ) sore throat Respiratory: ( - ) cough, ( - ) dyspnea, ( - ) wheezes Cardiovascular: ( - ) palpitation, ( - ) chest discomfort, ( - ) lower extremity swelling Gastrointestinal:  ( - ) nausea, ( - ) heartburn, ( - ) change in bowel habits Skin: ( - ) abnormal skin rashes Lymphatics: ( - ) new lymphadenopathy, ( - ) easy bruising Neurological: ( - ) numbness, ( - ) tingling, ( - ) new weaknesses Behavioral/Psych: ( - ) mood change, ( - ) new changes  All other systems were reviewed with the patient and are negative.  PHYSICAL EXAMINATION:  Vitals:   04/21/22 0809  BP: (!) 149/84  Pulse: 98  Resp: 13  Temp: 98 F (36.7 C)  SpO2: 98%      Filed Weights   04/21/22 0809  Weight: 189 lb (85.7 kg)    GENERAL: Well-appearing middle-aged African-American male, alert, no distress and comfortable SKIN: skin color, texture, turgor are normal, no rashes or significant lesions EYES: conjunctiva are pink and non-injected, sclera clear LUNGS: clear to auscultation and percussion with normal breathing effort HEART: regular rate & rhythm and no murmurs and no lower extremity edema Musculoskeletal: no cyanosis of digits and no clubbing. Large mobile, soft mass on mid back most consistent with lipoma.  PSYCH: alert & oriented x 3, fluent speech NEURO: no focal motor/sensory deficits  LABORATORY DATA:  I have reviewed the data as listed    Latest Ref Rng & Units 04/21/2022    7:31 AM 01/15/2022    9:13 AM 10/10/2021   10:48 AM  CBC  WBC 4.0 - 10.5 K/uL 3.2  4.2  5.0   Hemoglobin 13.0 - 17.0 g/dL 12.2  12.4  12.1   Hematocrit 39.0 - 52.0 % 38.3  38.7  37.6   Platelets 150 - 400 K/uL 252  234  214        Latest Ref Rng & Units 04/21/2022    7:31 AM 03/03/2022    1:51 PM 01/15/2022    9:13 AM  CMP  Glucose 70 - 99 mg/dL 87  88  107   BUN 8 - 23 mg/dL 22  18  15    Creatinine 0.61 - 1.24 mg/dL 1.47  1.23  1.21   Sodium 135 - 145 mmol/L 141  140  141   Potassium 3.5 - 5.1 mmol/L 4.4  4.2   4.5   Chloride 98 - 111 mmol/L 108  104  107   CO2 22 - 32 mmol/L 27  27  30    Calcium 8.9 - 10.3 mg/dL 8.8  8.4  9.3   Total Protein 6.5 - 8.1 g/dL 6.7  6.1  6.2   Total Bilirubin 0.3 - 1.2 mg/dL 0.6  0.7  0.7   Alkaline Phos 38 - 126 U/L 33  42  31   AST 15 -  41 U/L 19  15  20    ALT 0 - 44 U/L 17  14  18      RADIOGRAPHIC STUDIES: No results found.  ASSESSMENT & PLAN VARNER CANCILLA 72 y.o. male with medical history significant for mantle cell lymphoma in remission who presents for a follow up visit.  #Mantle Cell Lymphoma in Remission -- Prior PET CT scan in October 2022 showed no evidence of residual or recurrent disease, though there were some lymph nodes concerning for sarcoidosis --Patient has been remission since at least 06/11/2015 at which time his PET CT scan showed no evidence of recurrent or residual disease. --PET CT on 10/10/2021,  no clear evidence of relapsed/recurrent disease. Recommend standard CT scan q 6 months. CT scan due now.  --Labs from today were reviewed and require no intervention. Labs show white blood cell 3.2, hemoglobin 12.2, MCV 88.7, and platelets of 252 --Continue ibrutinib 420 mg p.o. daily --Return to clinic 3 months with labs.   Orders Placed This Encounter  Procedures   CT CHEST ABDOMEN PELVIS W CONTRAST    Standing Status:   Future    Standing Expiration Date:   04/21/2023    Order Specific Question:   If indicated for the ordered procedure, I authorize the administration of contrast media per Radiology protocol    Answer:   Yes    Order Specific Question:   Does the patient have a contrast media/X-ray dye allergy?    Answer:   No    Order Specific Question:   Preferred imaging location?    Answer:   [facility]    Order Specific Question:   Is Oral Contrast requested for this exam?    Answer:   Yes, Per Radiology protocol    All questions were answered. The patient knows to call the clinic with any problems, questions or  concerns.  I have spent a total of 30 minutes minutes of face-to-face and non-face-to-face time, preparing to see the patient performing a medically appropriate examination, counseling and educating the patient, ordering tests, documenting clinical information in the electronic health record, and care coordination.   Ledell Peoples, MD Department of Hematology/Oncology [place] at [facility] Phone: [phone] Pager: [phone] Email: [email or web address]  04/21/2022 8:42 AM

## 2022-04-21 ENCOUNTER — Inpatient Hospital Stay (HOSPITAL_BASED_OUTPATIENT_CLINIC_OR_DEPARTMENT_OTHER): Payer: Medicare PPO | Admitting: Hematology and Oncology

## 2022-04-21 ENCOUNTER — Inpatient Hospital Stay: Payer: Medicare PPO | Attending: Hematology and Oncology

## 2022-04-21 VITALS — BP 149/84 | HR 98 | Temp 98.0°F | Resp 13 | Wt 189.0 lb

## 2022-04-21 DIAGNOSIS — Z9221 Personal history of antineoplastic chemotherapy: Secondary | ICD-10-CM | POA: Insufficient documentation

## 2022-04-21 DIAGNOSIS — C8319 Mantle cell lymphoma, extranodal and solid organ sites: Secondary | ICD-10-CM | POA: Diagnosis not present

## 2022-04-21 DIAGNOSIS — Z8572 Personal history of non-Hodgkin lymphomas: Secondary | ICD-10-CM | POA: Diagnosis not present

## 2022-04-21 DIAGNOSIS — Z08 Encounter for follow-up examination after completed treatment for malignant neoplasm: Secondary | ICD-10-CM | POA: Diagnosis not present

## 2022-04-21 LAB — CMP (CANCER CENTER ONLY)
ALT: 17 U/L (ref 0–44)
AST: 19 U/L (ref 15–41)
Albumin: 4.3 g/dL (ref 3.5–5.0)
Alkaline Phosphatase: 33 U/L — ABNORMAL LOW (ref 38–126)
Anion gap: 6 (ref 5–15)
BUN: 22 mg/dL (ref 8–23)
CO2: 27 mmol/L (ref 22–32)
Calcium: 8.8 mg/dL — ABNORMAL LOW (ref 8.9–10.3)
Chloride: 108 mmol/L (ref 98–111)
Creatinine: 1.47 mg/dL — ABNORMAL HIGH (ref 0.61–1.24)
GFR, Estimated: 51 mL/min — ABNORMAL LOW (ref >60)
Glucose, Bld: 87 mg/dL (ref 70–99)
Potassium: 4.4 mmol/L (ref 3.5–5.1)
Sodium: 141 mmol/L (ref 135–145)
Total Bilirubin: 0.6 mg/dL (ref 0.3–1.2)
Total Protein: 6.7 g/dL (ref 6.5–8.1)

## 2022-04-21 LAB — LACTATE DEHYDROGENASE: LDH: 208 U/L — ABNORMAL HIGH (ref 98–192)

## 2022-04-21 LAB — CBC WITH DIFFERENTIAL (CANCER CENTER ONLY)
Abs Immature Granulocytes: 0 10*3/uL (ref 0.00–0.07)
Basophils Absolute: 0 10*3/uL (ref 0.0–0.1)
Basophils Relative: 1 %
Eosinophils Absolute: 0 10*3/uL (ref 0.0–0.5)
Eosinophils Relative: 1 %
HCT: 38.3 % — ABNORMAL LOW (ref 39.0–52.0)
Hemoglobin: 12.2 g/dL — ABNORMAL LOW (ref 13.0–17.0)
Immature Granulocytes: 0 %
Lymphocytes Relative: 27 %
Lymphs Abs: 0.9 10*3/uL (ref 0.7–4.0)
MCH: 28.2 pg (ref 26.0–34.0)
MCHC: 31.9 g/dL (ref 30.0–36.0)
MCV: 88.7 fL (ref 80.0–100.0)
Monocytes Absolute: 0.6 10*3/uL (ref 0.1–1.0)
Monocytes Relative: 18 %
Neutro Abs: 1.8 10*3/uL (ref 1.7–7.7)
Neutrophils Relative %: 53 %
Platelet Count: 252 10*3/uL (ref 150–400)
RBC: 4.32 MIL/uL (ref 4.22–5.81)
RDW: 14.5 % (ref 11.5–15.5)
WBC Count: 3.2 10*3/uL — ABNORMAL LOW (ref 4.0–10.5)
nRBC: 0 % (ref 0.0–0.2)

## 2022-04-21 NOTE — Telephone Encounter (Signed)
Patient has been informed okay to hold Eliquis for 1 day prior to procedure. Pt voiced understanding.

## 2022-04-24 ENCOUNTER — Ambulatory Visit (HOSPITAL_COMMUNITY)
Admission: RE | Admit: 2022-04-24 | Discharge: 2022-04-24 | Disposition: A | Discharge status: Home / Self Care | Payer: Medicare PPO | Source: Ambulatory Visit | Attending: Cardiology | Admitting: Cardiology

## 2022-04-24 DIAGNOSIS — I6523 Occlusion and stenosis of bilateral carotid arteries: Secondary | ICD-10-CM | POA: Diagnosis not present

## 2022-04-25 NOTE — Progress Notes (Signed)
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  He will need to continue to have 6 monthly carotid ultrasound studies for follow-up

## 2022-05-05 ENCOUNTER — Telehealth: Payer: Self-pay | Admitting: *Deleted

## 2022-05-06 ENCOUNTER — Other Ambulatory Visit (HOSPITAL_COMMUNITY): Payer: Self-pay

## 2022-05-07 ENCOUNTER — Ambulatory Visit (HOSPITAL_COMMUNITY)
Admission: RE | Admit: 2022-05-07 | Discharge: 2022-05-07 | Disposition: A | Discharge status: Home / Self Care | Payer: Medicare PPO | Source: Ambulatory Visit | Attending: Hematology and Oncology | Admitting: Hematology and Oncology

## 2022-05-07 DIAGNOSIS — C8319 Mantle cell lymphoma, extranodal and solid organ sites: Secondary | ICD-10-CM | POA: Diagnosis not present

## 2022-05-07 DIAGNOSIS — Z0389 Encounter for observation for other suspected diseases and conditions ruled out: Secondary | ICD-10-CM | POA: Diagnosis not present

## 2022-05-07 DIAGNOSIS — R911 Solitary pulmonary nodule: Secondary | ICD-10-CM | POA: Diagnosis not present

## 2022-05-07 MED ORDER — IOHEXOL 300 MG/ML  SOLN
100.0000 mL | Freq: Once | INTRAMUSCULAR | Status: AC | PRN
  Administered 2022-05-07: 100 mL via INTRAVENOUS

## 2022-05-07 MED ORDER — IOHEXOL 9 MG/ML PO SOLN
1000.0000 mL | ORAL | Status: AC
  Administered 2022-05-07: 1000 mL via ORAL

## 2022-05-07 MED ORDER — SODIUM CHLORIDE (PF) 0.9 % IJ SOLN
INTRAMUSCULAR | Status: AC
  Filled 2022-05-07: qty 50

## 2022-05-08 ENCOUNTER — Telehealth: Payer: Self-pay | Admitting: *Deleted

## 2022-05-08 ENCOUNTER — Telehealth: Payer: Self-pay | Admitting: Internal Medicine

## 2022-05-08 ENCOUNTER — Other Ambulatory Visit: Payer: Self-pay | Admitting: Hematology and Oncology

## 2022-05-08 MED ORDER — LORAZEPAM 1 MG PO TABS: 1.0000 mg | ORAL_TABLET | Freq: Once | ORAL | 0 refills | Status: AC

## 2022-05-08 NOTE — Telephone Encounter (Signed)
Wife states Mr Torbeck is very claustrophobic and needs something to take prior to MRI tomorrow.

## 2022-05-08 NOTE — Telephone Encounter (Signed)
Routing to HF clinic. Dr Aundra Dubin ordered this test 04/16/22. Pt requesting pre-procedure meds.

## 2022-05-08 NOTE — Telephone Encounter (Signed)
Notified that Dr Lorenso Courier sent in [place] 1 mg to take 30-60 minutes prior to MRI. Pt confirmed that his wife will be driving him.

## 2022-05-08 NOTE — Telephone Encounter (Signed)
-----   Message from Orson Slick, MD sent at 05/08/2022  8:57 AM EDT ----- Please let Mr. Buruca know that his CT scan did not show any signs of residual/recurrent mantle cell lymphoma.  There was a small area of mucus impaction in his lungs which we will keep an eye on with the neck CT scan.  We will plan to see him back as scheduled on 07/21/2022. ----- Message ----- From: Interface, Rad Results In Sent: 05/08/2022   8:20 AM EDT To: Orson Slick, MD

## 2022-05-08 NOTE — Telephone Encounter (Signed)
Notified of message below. Verbalized understanding 

## 2022-05-08 NOTE — Telephone Encounter (Signed)
Pt's wife is calling because the pt is having an MRI tomorrow and is very claustrophobic and would like to know if the pt can be prescribed something to help with his nerves for his MRI tomorrow. Please advise

## 2022-05-09 ENCOUNTER — Ambulatory Visit (HOSPITAL_COMMUNITY)
Admission: RE | Admit: 2022-05-09 | Discharge: 2022-05-09 | Disposition: A | Discharge status: Home / Self Care | Payer: Medicare PPO | Source: Ambulatory Visit | Attending: Cardiology | Admitting: Cardiology

## 2022-05-09 DIAGNOSIS — I7121 Aneurysm of the ascending aorta, without rupture: Secondary | ICD-10-CM | POA: Insufficient documentation

## 2022-05-09 MED ORDER — DIAZEPAM 2 MG PO TABS: 2.0000 mg | ORAL_TABLET | Freq: Once | ORAL | 0 refills | Status: AC

## 2022-05-09 MED ORDER — GADOBUTROL 1 MMOL/ML IV SOLN
8.5000 mL | Freq: Once | INTRAVENOUS | Status: AC | PRN
  Administered 2022-05-09: 8.5 mL via INTRAVENOUS

## 2022-05-09 NOTE — Telephone Encounter (Signed)
Script is not needed-meds ordered by oncology Shredded

## 2022-05-09 NOTE — Telephone Encounter (Signed)
Script printed  Called wife to notify  wife reports oncologist maybe working on a script as well Will return call to let us know if she still needs shortly

## 2022-05-16 ENCOUNTER — Other Ambulatory Visit: Payer: Self-pay

## 2022-05-16 ENCOUNTER — Telehealth: Payer: Self-pay | Admitting: Pharmacist

## 2022-05-16 NOTE — Telephone Encounter (Signed)
Patient was on 2023 insurance list as failing MAD - diabetes medication adherence however he does not have diabetes. He is taking Jardiance for cardiomyopathy.   He is on several brand medications so I called to see if he needed any assistance with cost - Jardiance 10mg  (LR 05/16/2022 for 30 days), Eliquis, Dominica and Entresto.  I think that he is getting Entresto from medication assistance program with [employer].   Spoke with patient and he reports that his wife manages his medications. He will have her call back when she returns home.

## 2022-05-22 ENCOUNTER — Other Ambulatory Visit (HOSPITAL_COMMUNITY): Payer: Self-pay | Admitting: Neurology

## 2022-05-22 DIAGNOSIS — I6522 Occlusion and stenosis of left carotid artery: Secondary | ICD-10-CM

## 2022-05-26 ENCOUNTER — Telehealth: Payer: Self-pay | Admitting: *Deleted

## 2022-05-26 NOTE — Telephone Encounter (Signed)
TCT patient regarding the lumps in his groin. Per Dr. Leonides Schanz, pt needs to keep his appt with his PCP as it is unlikely the lump is his lymphoma but could be infectious. Pt voiced understanding and will keep his appt on Wednesday, 05/28/22

## 2022-05-26 NOTE — Telephone Encounter (Signed)
Received vm message from pt's wife. She states that pt has new bruising in bilateral groin areas and has a new lump in his groin that is draining fluid. He has an appt with his primary PCP on Wednesday of this week. She is very concerned and wonders if we can see him sooner.  Returned call to her. Spoke with her. She said the main groin lump is quite large-the size of three fingers. There are  some smaller ones nearby   They were oozing clearish yellow  fluid. They have been keeping it clean with antiseptic solution.  They are very worried about this.  Please advise

## 2022-05-28 ENCOUNTER — Ambulatory Visit (INDEPENDENT_AMBULATORY_CARE_PROVIDER_SITE_OTHER): Payer: Medicare PPO | Admitting: Family Medicine

## 2022-05-28 ENCOUNTER — Encounter: Payer: Self-pay | Admitting: Family Medicine

## 2022-05-28 VITALS — BP 128/85 | HR 73 | Temp 97.9°F | Ht 70.0 in | Wt 185.4 lb

## 2022-05-28 DIAGNOSIS — L739 Follicular disorder, unspecified: Secondary | ICD-10-CM

## 2022-05-28 MED ORDER — DOXYCYCLINE HYCLATE 100 MG PO TABS
100.0000 mg | ORAL_TABLET | Freq: Two times a day (BID) | ORAL | 0 refills | Status: DC
Start: 2022-05-28 — End: 2022-06-20

## 2022-05-28 NOTE — Progress Notes (Signed)
Chief Complaint  Patient presents with   Rash    Right thigh-started 2 weeks ago    [name] is a 72 y.o. male here for a skin complaint.  Here with his wife.  Duration: 2 week Location: R thigh Pruritic? No Painful? Yes Drainage? Yes- clear New soaps/lotions/topicals/detergents? No Sick contacts? No Other associated symptoms: fevers, not spreading, not on genitals Therapies tried thus far: topical listerine  Past Medical History:  Diagnosis Date   Anemia    Cancer    Lymphoma   Essential hypertension    GERD (gastroesophageal reflux disease)    GI bleed    HLD (hyperlipidemia)    Weakness     BP 128/85 (BP Location: Left Arm, Patient Position: Sitting, Cuff Size: Normal)   Pulse 73   Temp 97.9 F (36.6 C) (Oral)   Ht  (1.778 m)   Wt 185 lb 6 oz (84.1 kg)   SpO2 97%   BMI 26.60 kg/m  Gen: awake, alert, appearing stated age Lungs: No accessory muscle use Skin: Over the right anterior and lateral thigh proximally, there are various pustules and areas of excoriation with surrounding erythema surrounding the follicles.. No drainage, fluctuance, scaling Psych: Age appropriate judgment and insight  Folliculitis - Plan: doxycycline (VIBRA-TABS) 100 MG tablet  7 days of doxycycline as encompasses a wide area.  Warning signs and symptoms verbalized and written down.  Sent message in 2 days if no improvement. F/u prn. The patient and his spouse voiced understanding and agreement to the plan.  Jilda Roche [place], DO 05/28/22 3:18 PM

## 2022-05-28 NOTE — Patient Instructions (Signed)
When you do wash it, use only soap and water. Do not vigorously scrub. Keep the area clean and dry.   Things to look out for: increasing pain not relieved by ibuprofen/acetaminophen, fevers, spreading redness, drainage of pus, or foul odor.  Let us know if you need anything.  

## 2022-06-05 ENCOUNTER — Other Ambulatory Visit (HOSPITAL_COMMUNITY): Payer: Self-pay

## 2022-06-18 ENCOUNTER — Telehealth: Payer: Self-pay | Admitting: Internal Medicine

## 2022-06-18 ENCOUNTER — Other Ambulatory Visit (HOSPITAL_COMMUNITY): Payer: Self-pay

## 2022-06-18 NOTE — Telephone Encounter (Signed)
Inbound call from patient, he have a procedure on 06/20/2022 and want to know when he need to stop taking his Eliquis.Please advise.

## 2022-06-18 NOTE — Telephone Encounter (Signed)
Returned the patient's phone call regarding his Eliquis. According to his cardiac clearance he is to hold for 1 day prior to his colonoscopy. Informed the patient and he verbalized understanding.

## 2022-06-19 ENCOUNTER — Telehealth: Payer: Self-pay | Admitting: Family Medicine

## 2022-06-19 DIAGNOSIS — L739 Follicular disorder, unspecified: Secondary | ICD-10-CM

## 2022-06-19 NOTE — Telephone Encounter (Signed)
Prescription Request  06/19/2022  Is this a "Controlled Substance" medicine? No  LOV: 05/28/2022  What is the name of the medication or equipment?   doxycycline (VIBRA-TABS) 100 MG tablet   Have you contacted your pharmacy to request a refill? No   Which pharmacy would you like this sent to?  [id number] - [place], [place] - [address] AT [facility] OF [address] [place] [address] Phone: [phone] Fax: [phone]      Patient notified that their request is being sent to the clinical staff for review and that they should receive a response within 2 business days.   Please advise at Mobile [phone] (mobile)

## 2022-06-20 ENCOUNTER — Encounter: Payer: Self-pay | Admitting: Internal Medicine

## 2022-06-20 ENCOUNTER — Ambulatory Visit (AMBULATORY_SURGERY_CENTER): Payer: Medicare PPO | Admitting: Internal Medicine

## 2022-06-20 VITALS — BP 96/63 | HR 60 | Temp 97.1°F | Resp 13 | Ht 70.0 in | Wt 188.0 lb

## 2022-06-20 DIAGNOSIS — D122 Benign neoplasm of ascending colon: Secondary | ICD-10-CM

## 2022-06-20 DIAGNOSIS — I10 Essential (primary) hypertension: Secondary | ICD-10-CM | POA: Diagnosis not present

## 2022-06-20 DIAGNOSIS — Z1211 Encounter for screening for malignant neoplasm of colon: Secondary | ICD-10-CM

## 2022-06-20 DIAGNOSIS — D123 Benign neoplasm of transverse colon: Secondary | ICD-10-CM

## 2022-06-20 DIAGNOSIS — K635 Polyp of colon: Secondary | ICD-10-CM | POA: Diagnosis not present

## 2022-06-20 MED ORDER — SODIUM CHLORIDE 0.9 % IV SOLN
500.0000 mL | Freq: Once | INTRAVENOUS | Status: DC
Start: 2022-06-20 — End: 2022-06-20

## 2022-06-20 MED ORDER — DOXYCYCLINE HYCLATE 100 MG PO TABS
100.0000 mg | ORAL_TABLET | Freq: Two times a day (BID) | ORAL | 0 refills | Status: AC
Start: 2022-06-20 — End: 2022-06-30

## 2022-06-20 NOTE — Progress Notes (Signed)
Pt's states no medical or surgical changes since previsit or office visit. 

## 2022-06-20 NOTE — Addendum Note (Signed)
Addended by: Radene Gunning on: 06/20/2022 11:03 AM   Modules accepted: Orders

## 2022-06-20 NOTE — Telephone Encounter (Signed)
He was seen fairly recently. What is the situation?

## 2022-06-20 NOTE — Telephone Encounter (Signed)
10 d sent, needs to come back if this still doesn't do the trick. Ty.

## 2022-06-20 NOTE — Patient Instructions (Signed)
Await pathology results.    Restart Eliquis tomorrow.  Handouts on polyps and hemorrhoids provided.  YOU HAD AN ENDOSCOPIC PROCEDURE TODAY AT THE [facility]:   Refer to the procedure report that was given to you for any specific questions about what was found during the examination.  If the procedure report does not answer your questions, please call your gastroenterologist to clarify.  If you requested that your care partner not be given the details of your procedure findings, then the procedure report has been included in a sealed envelope for you to review at your convenience later.  YOU SHOULD EXPECT: Some feelings of bloating in the abdomen. Passage of more gas than usual.  Walking can help get rid of the air that was put into your GI tract during the procedure and reduce the bloating. If you had a lower endoscopy (such as a colonoscopy or flexible sigmoidoscopy) you may notice spotting of blood in your stool or on the toilet paper. If you underwent a bowel prep for your procedure, you may not have a normal bowel movement for a few days.  Please Note:  You might notice some irritation and congestion in your nose or some drainage.  This is from the oxygen used during your procedure.  There is no need for concern and it should clear up in a day or so.  SYMPTOMS TO REPORT IMMEDIATELY:  Following lower endoscopy (colonoscopy or flexible sigmoidoscopy):  Excessive amounts of blood in the stool  Significant tenderness or worsening of abdominal pains  Swelling of the abdomen that is new, acute  Fever of 100F or higher   For urgent or emergent issues, a gastroenterologist can be reached at any hour by calling [phone]. Do not use MyChart messaging for urgent concerns.    DIET:  We do recommend a small meal at first, but then you may proceed to your regular diet.  Drink plenty of fluids but you should avoid alcoholic beverages for 24 hours.  ACTIVITY:  You should plan to  take it easy for the rest of today and you should NOT DRIVE or use heavy machinery until tomorrow (because of the sedation medicines used during the test).    FOLLOW UP: Our staff will call the number listed on your records the next business day following your procedure.  We will call around 7:15- 8:00 am to check on you and address any questions or concerns that you may have regarding the information given to you following your procedure. If we do not reach you, we will leave a message.     If any biopsies were taken you will be contacted by phone or by letter within the next 1-3 weeks.  Please call us at [phone] if you have not heard about the biopsies in 3 weeks.    SIGNATURES/CONFIDENTIALITY: You and/or your care partner have signed paperwork which will be entered into your electronic medical record.  These signatures attest to the fact that that the information above on your After Visit Summary has been reviewed and is understood.  Full responsibility of the confidentiality of this discharge information lies with you and/or your care-partner.

## 2022-06-20 NOTE — Telephone Encounter (Signed)
Patient's wife informed

## 2022-06-20 NOTE — Progress Notes (Signed)
Uneventful anesthetic. Report to pacu rn. Vss. Care resumed by rn. 

## 2022-06-20 NOTE — Telephone Encounter (Signed)
Spoke to his wife and area still looks infected has not completely cleared up

## 2022-06-20 NOTE — Telephone Encounter (Signed)
Yes there has been improvement just not completely healed

## 2022-06-20 NOTE — Progress Notes (Signed)
Called to room to assist during endoscopic procedure.  Patient ID and intended procedure confirmed with present staff. Received instructions for my participation in the procedure from the performing physician.  

## 2022-06-20 NOTE — Progress Notes (Signed)
GASTROENTEROLOGY PROCEDURE H&P NOTE   Primary Care Physician: Sharlene Dory, DO    Reason for Procedure:   Colon cancer screening  Plan:    Colonoscopy  Patient is appropriate for endoscopic procedure(s) in the ambulatory ([facility]) setting.  The nature of the procedure, as well as the risks, benefits, and alternatives were carefully and thoroughly reviewed with the patient. Ample time for discussion and questions allowed. The patient understood, was satisfied, and agreed to proceed.     HPI: [name] is a 72 y.o. male who presents for colonoscopy for evaluation of colon cancer screening .  Patient was most recently seen in the Gastroenterology Clinic on 04/15/22.  No interval change in medical history since that appointment. Please refer to that note for full details regarding GI history and clinical presentation.   Past Medical History:  Diagnosis Date   Anemia    Cancer (HCC)    Lymphoma   Essential hypertension    GERD (gastroesophageal reflux disease)    GI bleed    HLD (hyperlipidemia)    Weakness     Past Surgical History:  Procedure Laterality Date   ESOPHAGOGASTRODUODENOSCOPY Left 01/18/2015   Procedure: ESOPHAGOGASTRODUODENOSCOPY (EGD);  Surgeon: Charlott Rakes, MD;  Location[facility] END[facility]COPY;  Service: Endoscopy;  Laterality: Left;   INGUINAL HERNIA REPAIR Right 01/22/2015   Procedure: RIGHT INGUINAL LYMPH NODE BX;  Surgeon: Manus Rudd, MD;  Location: [facility] OR;  Service: General;  Laterality: Right;   IR REMOVAL TUN ACCESS W/ PORT W/O FL MOD SED  01/24/2022   MEDIASTINOSCOPY N/A 05/05/2017   Procedure: MEDIASTINOSCOPY;  Surgeon: Delight Ovens, MD;  Location: [facility] OR;  Service: Thoracic;  Laterality: N/A;   PORTACATH PLACEMENT  01/26/2015    power port with tip SVC/RA Junction   SKIN SURGERY     Small benign cysts over left scalp removed   VIDEO BRONCHOSCOPY WITH ENDOBRONCHIAL ULTRASOUND N/A 05/05/2017   Procedure: VIDEO BRONCHOSCOPY WITH  ENDOBRONCHIAL ULTRASOUND;  Surgeon: Delight Ovens, MD;  Location: [facility] OR;  Service: Thoracic;  Laterality: N/A;    Prior to Admission medications   Medication Sig Start Date End Date Taking? Authorizing Provider  atorvastatin (LIPITOR) 80 MG tablet Take 1 tablet (80 mg total) by mouth daily. 03/03/22  Yes Sharlene Dory, DO  carvedilol (COREG) 12.5 MG tablet Take 1 tablet (12.5 mg total) by mouth 2 (two) times daily with a meal. 04/14/22  Yes Wittenborn, Deborah, NP  ibrutinib (IMBRUVICA) 420 MG tablet Take 1 tablet (420 mg) by mouth daily. 04/03/22  Yes Jaci Standard, MD  JARDIANCE 10 MG TABS tablet TAKE 1 TABLET BY MOUTH DAILY BEFORE BREAKFAST. 04/17/22  Yes Laurey Morale, MD  sacubitril-valsartan (ENTRESTO) 97-103 MG Take 1 tablet by mouth 2 (two) times daily. 03/14/21  Yes Hilty, Lisette Abu, MD  spironolactone (ALDACTONE) 25 MG tablet TAKE 1 TABLET (25 MG TOTAL) BY MOUTH DAILY. 10/14/21  Yes Laurey Morale, MD  apixaban (ELIQUIS) 5 MG TABS tablet TAKE 1 TABLET BY MOUTH TWICE A DAY 12/16/21   Lanier Prude, MD    Current Outpatient Medications  Medication Sig Dispense Refill   atorvastatin (LIPITOR) 80 MG tablet Take 1 tablet (80 mg total) by mouth daily. 90 tablet 3   carvedilol (COREG) 12.5 MG tablet Take 1 tablet (12.5 mg total) by mouth 2 (two) times daily with a meal. 180 tablet 3   ibrutinib (IMBRUVICA) 420 MG tablet Take 1 tablet (420 mg) by mouth daily.  28 tablet 6   JARDIANCE 10 MG TABS tablet TAKE 1 TABLET BY MOUTH DAILY BEFORE BREAKFAST. 30 tablet 11   sacubitril-valsartan (ENTRESTO) 97-103 MG Take 1 tablet by mouth 2 (two) times daily. 180 tablet 3   spironolactone (ALDACTONE) 25 MG tablet TAKE 1 TABLET (25 MG TOTAL) BY MOUTH DAILY. 90 tablet 3   apixaban (ELIQUIS) 5 MG TABS tablet TAKE 1 TABLET BY MOUTH TWICE A DAY 60 tablet 5   Current Facility-Administered Medications  Medication Dose Route Frequency Provider Last Rate Last Admin   0.9 %  sodium chloride  infusion  500 mL Intravenous Once Imogene Burn, MD       0.9 %  sodium chloride infusion  500 mL Intravenous Once Imogene Burn, MD        Allergies as of 06/20/2022   (No Known Allergies)    Family History  Problem Relation Age of Onset   Hypertension Mother    Hypertension Father    Kidney failure Father    Hypertension Sister    Diabetes Sister    Lupus Sister    Prostate cancer Brother    Lung cancer Brother    Colon cancer Neg Hx    Stomach cancer Neg Hx     Social History   Socioeconomic History   Marital status: Married    Spouse name: Not on file   Number of children: Not on file   Years of education: Not on file   Highest education level: Bachelor's degree (e.g., BA, AB, BS)  Occupational History   Occupation: Retired  Tobacco Use   Smoking status: Never   Smokeless tobacco: Never  Vaping Use   Vaping Use: Never used  Substance and Sexual Activity   Alcohol use: Never    Alcohol/week: 6.0 standard drinks of alcohol    Types: 3 Cans of beer, 3 Shots of liquor per week   Drug use: No   Sexual activity: Yes    Partners: Female  Other Topics Concern   Not on file  Social History Narrative   Not on file   Social Determinants of Health   Financial Resource Strain: Low Risk  (05/23/2022)   Overall Financial Resource Strain (CARDIA)    Difficulty of Paying Living Expenses: Not hard at all  Food Insecurity: No Food Insecurity (05/23/2022)   Hunger Vital Sign    Worried About Running Out of Food in the Last Year: Never true    Ran Out of Food in the Last Year: Never true  Transportation Needs: No Transportation Needs (05/23/2022)   PRAPARE - Administrator, Civil Service (Medical): No    Lack of Transportation (Non-Medical): No  Physical Activity: Sufficiently Active (05/23/2022)   Exercise Vital Sign    Days of Exercise per Week: 5 days    Minutes of Exercise per Session: 30 min  Stress: No Stress Concern Present (04/02/2021)   [employer] of Occupational [employer] - Occupational Stress Questionnaire    Feeling of Stress : Not at all  Social Connections: Moderately Isolated (05/23/2022)   Social Connection and Isolation Panel [NHANES]    Frequency of Communication with Friends and Family: More than three times a week    Frequency of Social Gatherings with Friends and Family: Once a week    Attends Religious Services: Never    Database administrator or Organizations: No    Attends Engineer, structural: Not on file    Marital Status: Married  Intimate Partner Violence: Not At Risk (04/02/2021)   Humiliation, Afraid, Rape, and Kick questionnaire    Fear of Current or Ex-Partner: No    Emotionally Abused: No    Physically Abused: No    Sexually Abused: No    Physical Exam: Vital signs in last 24 hours: BP 98/65   Pulse 71   Temp (!) 97.1 F (36.2 C)   Resp 16   Ht 5\' 10"  (1.778 m)   Wt 188 lb (85.3 kg)   SpO2 99%   BMI 26.98 kg/m  GEN: NAD EYE: Sclerae anicteric ENT: MMM CV: Non-tachycardic Pulm: No increased WOB GI: Soft NEURO:  Alert & Oriented   Eulah Pont, MD [facility]   06/20/2022 8:12 AM

## 2022-06-20 NOTE — Op Note (Signed)
[facility] Patient Name: [name] Procedure Date: 06/20/2022 7:13 AM MRN: [id number] Endoscopist: Particia Lather , , [id number] Age: 72 Referring MD:  Date of Birth: [birth year] Gender: Male Account #: [id number] Procedure:                Colonoscopy Indications:              Screening for colorectal malignant neoplasm, This                            is the patient's first colonoscopy Medicines:                Monitored Anesthesia Care Procedure:                Pre-Anesthesia Assessment:                           - Prior to the procedure, a History and Physical                            was performed, and patient medications and                            allergies were reviewed. The patient's tolerance of                            previous anesthesia was also reviewed. The risks                            and benefits of the procedure and the sedation                            options and risks were discussed with the patient.                            All questions were answered, and informed consent                            was obtained. Prior Anticoagulants: The patient has                            taken Eliquis (apixaban), last dose was 2 days                            prior to procedure. ASA Grade Assessment: III - A                            patient with severe systemic disease. After                            reviewing the risks and benefits, the patient was                            deemed in satisfactory condition to undergo the  procedure.                           After obtaining informed consent, the colonoscope                            was passed under direct vision. Throughout the                            procedure, the patient's blood pressure, pulse, and                            oxygen saturations were monitored continuously. The                            Olympus CF-HQ190L ([id number]) Colonoscope was                             introduced through the anus and advanced to the the                            cecum, identified by appendiceal orifice and                            ileocecal valve. Scope In: 8:14:59 AM Scope Out: 8:33:52 AM Scope Withdrawal Time: 0 hours 13 minutes 8 seconds  Total Procedure Duration: 0 hours 18 minutes 53 seconds  Findings:                 Two sessile polyps were found in the ascending                            colon. The polyps were 1 to 2 mm in size. These                            polyps were removed with a cold snare. Resection                            and retrieval were complete.                           A 4 mm polyp was found in the transverse colon. The                            polyp was sessile. The polyp was removed with a                            cold snare. Resection and retrieval were complete.                           Non-bleeding internal hemorrhoids were found during                            retroflexion. Complications:  No immediate complications. Estimated Blood Loss:     Estimated blood loss was minimal. Impression:               - Two 1 to 2 mm polyps in the ascending colon,                            removed with a cold snare. Resected and retrieved.                           - One 4 mm polyp in the transverse colon, removed                            with a cold snare. Resected and retrieved.                           - Non-bleeding internal hemorrhoids. Recommendation:           - Discharge patient to home (with escort).                           - Await pathology results.                           - Okay to restart your Eliquis tomorrow.                           - The findings and recommendations were discussed                            with the patient. Dr Particia Lather "[place]" [place],  06/20/2022 8:41:07 AM

## 2022-06-20 NOTE — Telephone Encounter (Signed)
Did he get better with it at all?

## 2022-06-23 ENCOUNTER — Telehealth: Payer: Self-pay

## 2022-06-23 NOTE — Telephone Encounter (Signed)
  Follow up Call-     06/20/2022    7:18 AM  Call back number  Post procedure Call Back phone  # [phone]  Permission to leave phone message Yes     Patient questions:  Do you have a fever, pain , or abdominal swelling? No. Pain Score  0 *  Have you tolerated food without any problems? Yes.    Have you been able to return to your normal activities? Yes.    Do you have any questions about your discharge instructions: Diet   No. Medications  No. Follow up visit  No.  Do you have questions or concerns about your Care? No.  Actions: * If pain score is 4 or above: No action needed, pain <4.

## 2022-06-24 DIAGNOSIS — H5203 Hypermetropia, bilateral: Secondary | ICD-10-CM | POA: Diagnosis not present

## 2022-06-24 DIAGNOSIS — H40013 Open angle with borderline findings, low risk, bilateral: Secondary | ICD-10-CM | POA: Diagnosis not present

## 2022-06-26 ENCOUNTER — Encounter: Payer: Self-pay | Admitting: Internal Medicine

## 2022-06-26 ENCOUNTER — Other Ambulatory Visit: Payer: Self-pay | Admitting: Cardiology

## 2022-06-26 DIAGNOSIS — I480 Paroxysmal atrial fibrillation: Secondary | ICD-10-CM

## 2022-06-26 NOTE — Telephone Encounter (Signed)
Prescription refill request for Eliquis received. Indication: a flutter Last office visit: 04/16/22 Scr: 1.47 epic 04/21/22 Age: 72 Weight:  84kg

## 2022-07-01 ENCOUNTER — Telehealth: Payer: Self-pay | Admitting: Neurology

## 2022-07-01 ENCOUNTER — Encounter: Payer: Self-pay | Admitting: Neurology

## 2022-07-01 ENCOUNTER — Ambulatory Visit: Payer: Medicare PPO | Admitting: Neurology

## 2022-07-01 ENCOUNTER — Institutional Professional Consult (permissible substitution): Payer: Medicare PPO | Admitting: Neurology

## 2022-07-01 VITALS — BP 101/54 | HR 66 | Ht 70.0 in | Wt 184.0 lb

## 2022-07-01 DIAGNOSIS — R413 Other amnesia: Secondary | ICD-10-CM | POA: Diagnosis not present

## 2022-07-01 DIAGNOSIS — Z8673 Personal history of transient ischemic attack (TIA), and cerebral infarction without residual deficits: Secondary | ICD-10-CM | POA: Diagnosis not present

## 2022-07-01 DIAGNOSIS — I6522 Occlusion and stenosis of left carotid artery: Secondary | ICD-10-CM

## 2022-07-01 DIAGNOSIS — G3184 Mild cognitive impairment, so stated: Secondary | ICD-10-CM

## 2022-07-01 NOTE — Patient Instructions (Signed)
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  I encouraged him to increase participation in cognitively challenging activities like solving crossword puzzles, playing bridge and sudoku.  We also discussed memory compensation strategies.  Continue Eliquis for stroke prevention given prior history of paroxysmal A-fib aggressive risk factor modification with strict control of hypertension with blood pressure goal below 130/90, lipids with LDL cholesterol goal below 70 mg percent and diabetes with hemoglobin A1c goal below 6.5%.  He was also encouraged to eat healthy diet with lots of fruits, vegetables, cereals and whole grains.  He will return for follow-up in the future in 3 months with nurse practitioner or call earlier if necessary   Memory Compensation Strategies  Use "WARM" strategy.  W= write it down  A= associate it  R= repeat it  M= make a mental note  2.   You can keep a Glass blower/designer.  Use a 3-ring notebook with sections for the following: calendar, important names and phone numbers,  medications, doctors' names/phone numbers, lists/reminders, and a section to journal what you did  each day.   3.    Use a calendar to write appointments down.  4.    Write yourself a schedule for the day.  This can be placed on the calendar or in a separate section of the Memory Notebook.  Keeping a  regular schedule can help memory.  5.    Use medication organizer with sections for each day or morning/evening pills.  You may need help loading it  6.    Keep a basket, or pegboard by the door.  Place items that you need to take out with you in the basket or on the pegboard.  You may also want to  include a message board for reminders.  7.    Use sticky notes.  Place  sticky notes with reminders in a place where the task is performed.  For example: " turn off the  stove" placed by the stove, "lock the door" placed on the door at eye level, " take your medications" on  the bathroom mirror or by the place where you normally take your medications.  8.    Use alarms/timers.  Use while cooking to remind yourself to check on food or as a reminder to take your medicine, or as a  reminder to make a call, or as a reminder to perform another task, etc.

## 2022-07-01 NOTE — Telephone Encounter (Signed)
Ethlyn Gallery: [phone] exp. 07/01/22-07/31/22 sent to GI [phone]

## 2022-07-01 NOTE — Progress Notes (Signed)
[facility] [address] [place]. [place] [address] [phone]       OFFICE CONSULT NOTE  [name] Date of Birth:  September 10, 1950 Medical Record Number:  [id number]   Referring MD: Evalyn Casco, NP   Reason for Referral: Memory loss    HPI: [name] is a pleasant 72 year old African-American male seen today for consultation visit for memory loss.  He is accompanied by his wife.  History is obtained from them and review of electronic medical records.  I personally reviewed pertinent available imaging films in PACS.  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  Patient states for the last 1 year has noticed memory difficulties.  Wife has noticed that he is at times forgets people's names remember it later.  He also has trouble remembering directions and often forgets so he is Back to driving.  He is otherwise independent in activities of daily living.  He can feed himself, dress himself use the restroom.  In fact works out in the yard.  Patient denies significant headaches, seizures, loss of consciousness or any recent stroke or TIA episodes.  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  He is asymptomatic moderate left carotid stenosis which has remained stable over the years and last carotid ultrasound on 05/01/2022 showed 60 to 79% left ICA stenosis.  Lab work on 02/23/2022 showed LDL cholesterol to be 75 mg percent.  Hemoglobin A1c on 04/09/2021 was 5.6.  Patient remains on Eliquis which is tolerating well with only minor bruising and no bleeding.  States his tolerating Lipitor well without side effects.  He does follow-up with his cardiologist regularly for his congestive  heart failure and last echo on 04/16/2021 had shown improved ejection fraction to 45 to 50%.  He has no family history of Alzheimer's dementia.  He has had no recent brain imaging studies done or any lab work for reversible causes checked. Prior office visit with Shanda Bumps, NP 8//2021 : Mr. [name] is a 72 y.o. male with history of HTN, HLD, lymphoma on chemo  who presented on 08/01/2019 with left sided numbness and imbalance.  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  Also showed evidence of old right frontal lobe infarct and few scattered old bilateral cerebellar infarcts.  Recommended DAPT for 3 weeks and aspirin alone.  Cardiomyopathy with EF 35 to 40% and recommended follow-up with cardiology for further evaluation.  Carotid stenosis with left ICA 60 to 79% stenosis currently asymptomatic and recommended vascular surgery referral for outpatient follow-up.  Reported PAF but per cardiology no, frequent PVCs and PACs.  HTN stable.  LDL 155 and discontinued Mevacor and initiate atorvastatin 80 mg daily.  Other stroke risk factors include advanced age and history of stroke on imaging.  Other active problems include lymphoma on chemotherapy.  No therapy needed at discharge and discharged home in stable condition.   Stroke:   R pontine infarct secondary to small vessel disease source Code Stroke CT head likely chronic mid R frontal lobe infarct. Chronic superior L cerebellar infarct. Small vessel disease. Atrophy. ASPECTS 10.    MRI  R dorsal pontine infarct. Old R frontal lobe infarct. Few scattered old B cerebellar infarcts. MRA  Unremarkable  Carotid Doppler  L ICA 60-79% (incidental). L VA retrograde flow. 2D Echo EF 35-40%. LA moderately dilated. No source of embolus  LDL 155 HgbA1c 6.1 UDS neg SCDs for VTE prophylaxis No antithrombotic prior to admission, now on aspirin 81 mg daily and clopidogrel 75 mg daily. Continue DAPT x 3 weeks then aspirin  alone Therapy recommendations:  none Disposition:  home   Today, 09/07/2019, Mr. [name] is being seen for hospital follow-up accompanied by his wife.  He has recovered well without residual deficits and denies new stroke/TIA symptoms.  Wife does report mild cognitive impairment which has been ongoing " for many years".  Completed 3 weeks DAPT and remains on aspirin alone without bleeding or bruising.  Continues on atorvastatin 80 mg daily without myalgias.  Blood pressure today 137/86.  Recent follow-up with cardiology who recommended cardiac monitor for 7 days for possible atrial fibrillation which has been completed and currently awaiting results.  Scheduled for additional cardiac evaluation on Friday.  No concerns at this time. ROS:   14 system review of systems is positive for memory loss, word finding difficulty, difficulty with names, bruising all other systems negative  PMH:  Past Medical History:  Diagnosis Date   Anemia    Cancer (HCC)    Lymphoma   Essential hypertension    GERD (gastroesophageal reflux disease)    GI bleed    HLD (hyperlipidemia)    Weakness     Social History:  Social History   Socioeconomic History   Marital status: Married    Spouse name: Not on file   Number of children: Not on file   Years of education: Not on file   Highest education level: Bachelor's degree (e.g., BA, AB, BS)  Occupational History   Occupation: Retired  Tobacco Use   Smoking status: Never   Smokeless tobacco: Never  Vaping Use   Vaping Use: Never used  Substance and Sexual Activity   Alcohol use: Never    Alcohol/week: 6.0 standard drinks of alcohol    Types: 3 Cans of beer, 3 Shots of liquor per week   Drug use: No   Sexual activity: Yes    Partners: Female  Other Topics Concern   Not on file  Social History Narrative   Not on file   Social Determinants of Health   Financial Resource Strain: Low Risk  (05/23/2022)   Overall Financial Resource Strain (CARDIA)     Difficulty of Paying Living Expenses: Not hard at all  Food Insecurity: No Food Insecurity (05/23/2022)   Hunger Vital Sign    Worried About Running Out of Food in the Last Year: Never true    Ran Out of Food in the Last Year: Never true  Transportation Needs: No Transportation Needs (05/23/2022)   PRAPARE - Administrator, Civil Service (Medical): No    Lack of Transportation (Non-Medical): No  Physical Activity: Sufficiently Active (05/23/2022)   Exercise Vital Sign    Days of Exercise per Week: 5 days    Minutes of Exercise per Session: 30 min  Stress: No Stress Concern Present (04/02/2021)   [employer] of Occupational Health - Occupational Stress Questionnaire    Feeling of Stress : Not at all  Social Connections: Moderately Isolated (05/23/2022)   Social Connection and Isolation Panel [NHANES]    Frequency of Communication with Friends and Family: More than three times a week    Frequency of Social Gatherings with Friends and Family: Once a week    Attends  Religious Services: Never    Active Member of Clubs or Organizations: No    Attends Banker Meetings: Not on file    Marital Status: Married  Intimate Partner Violence: Not At Risk (04/02/2021)   Humiliation, Afraid, Rape, and Kick questionnaire    Fear of Current or Ex-Partner: No    Emotionally Abused: No    Physically Abused: No    Sexually Abused: No    Medications:   Current Outpatient Medications on File Prior to Visit  Medication Sig Dispense Refill   apixaban (ELIQUIS) 5 MG TABS tablet TAKE 1 TABLET BY MOUTH TWICE A DAY 180 tablet 1   atorvastatin (LIPITOR) 80 MG tablet Take 1 tablet (80 mg total) by mouth daily. 90 tablet 3   carvedilol (COREG) 12.5 MG tablet Take 1 tablet (12.5 mg total) by mouth 2 (two) times daily with a meal. 180 tablet 3   ibrutinib (IMBRUVICA) 420 MG tablet Take 1 tablet (420 mg) by mouth daily. 28 tablet 6   JARDIANCE 10 MG TABS tablet TAKE 1 TABLET BY MOUTH DAILY  BEFORE BREAKFAST. 30 tablet 11   sacubitril-valsartan (ENTRESTO) 97-103 MG Take 1 tablet by mouth 2 (two) times daily. 180 tablet 3   spironolactone (ALDACTONE) 25 MG tablet TAKE 1 TABLET (25 MG TOTAL) BY MOUTH DAILY. 90 tablet 3   No current facility-administered medications on file prior to visit.    Allergies:  No Known Allergies  Physical Exam General: well developed, well nourished pleasant middle-aged African-American male, seated, in no evident distress Head: head normocephalic and atraumatic.   Neck: supple with no carotid or supraclavicular bruits Cardiovascular: regular rate and rhythm, no murmurs Musculoskeletal: no deformity Skin:  no rash/petichiae Vascular:  Normal pulses all extremities  Neurologic Exam Mental Status: Awake and fully alert. Oriented to place and time. Recent and remote memory intact. Attention span, concentration and fund of knowledge appropriate. Mood and affect appropriate.  Diminished recall 0/3.  Able to name 11 animals which can walk on 4 legs.  Clock drawing 4/4. Cranial Nerves: Fundoscopic exam reveals sharp disc margins. Pupils equal, briskly reactive to light. Extraocular movements full without nystagmus. Visual fields full to confrontation. Hearing intact. Facial sensation intact. Face, tongue, palate moves normally and symmetrically.  Motor: Normal bulk and tone. Normal strength in all tested extremity muscles. Sensory.: intact to touch , pinprick , position and vibratory sensation.  Coordination: Rapid alternating movements normal in all extremities. Finger-to-nose and heel-to-shin performed accurately bilaterally. Gait and Station: Arises from chair without difficulty. Stance is normal. Gait demonstrates normal stride length and balance . Able to heel, toe and tandem walk without difficulty.  Reflexes: 1+ and symmetric. Toes downgoing.      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  Remote history of right pontine stroke in June 2021.  Vascular risk factors paroxysmal A-fib, heart failure, diabetes and moderate asymptomatic left carotid stenosis.     PLAN: I had a  long discussion with the patient and his wife regarding his new complaints of memory loss likely due to his age-related mild cognitive impairment but given his prior history of stroke recommend further evaluation by checking memory panel labs, EEG and MRI scan of the brain.  I encouraged him to increase participation in cognitively challenging activities like solving crossword puzzles, playing bridge and sudoku.  We also discussed memory compensation strategies.  Continue Eliquis for stroke prevention given prior history of paroxysmal A-fib aggressive risk factor modification with strict control of hypertension with blood pressure goal below 130/90, lipids with LDL cholesterol goal below 70 mg percent and diabetes with hemoglobin A1c goal below 6.5%.  He was also encouraged to eat healthy diet with lots of fruits, vegetables, cereals and whole grains.  Continue conservative follow-up for his asymptomatic left carotid stenosis with 6 monthly carotid ultrasound. Marland Kitchen He will return for follow-up in the future in 3 months with nurse practitioner or call earlier if necessary.  Greater than 50% time during this 45-minute consultation visit on counseling and coordination of care about his memory loss and cognitive impairment and answering questions  Delia Heady, MD Note: This document was prepared with  digital dictation and possible smart phrase technology. Any transcriptional errors that result from this process are unintentional.

## 2022-07-02 ENCOUNTER — Encounter: Payer: Self-pay | Admitting: Anesthesiology

## 2022-07-02 NOTE — Progress Notes (Signed)
Kindly inform the patient that vitamin B12, thyroid hormone and test for syphilis were normal

## 2022-07-03 ENCOUNTER — Other Ambulatory Visit: Payer: Self-pay | Admitting: Neurology

## 2022-07-03 LAB — DEMENTIA PANEL
Homocysteine: 22.8 umol/L — ABNORMAL HIGH (ref 0.0–17.2)
RPR Ser Ql: NONREACTIVE
TSH: 3.09 u[IU]/mL (ref 0.450–4.500)
Vitamin B-12: 424 pg/mL (ref 232–1245)

## 2022-07-03 MED ORDER — FOLIC ACID 1 MG PO TABS: 1.0000 mg | ORAL_TABLET | Freq: Every day | ORAL | 3 refills | Status: DC

## 2022-07-03 NOTE — Progress Notes (Signed)
Kindly inform the patient that homocystine which is a protein in the blood levels are little high.  This may affect his memory.  In case he smokes he needs to quit smoking.  I am also going to prescribe folic acid 1 mg daily which he needs to take and we will repeat blood levels in 2 to 3 months.

## 2022-07-09 ENCOUNTER — Other Ambulatory Visit (HOSPITAL_COMMUNITY): Payer: Self-pay

## 2022-07-11 ENCOUNTER — Other Ambulatory Visit (HOSPITAL_COMMUNITY): Payer: Self-pay

## 2022-07-14 ENCOUNTER — Institutional Professional Consult (permissible substitution): Payer: Medicare PPO | Admitting: Neurology

## 2022-07-17 ENCOUNTER — Ambulatory Visit: Payer: Medicare PPO | Admitting: Neurology

## 2022-07-17 DIAGNOSIS — R4182 Altered mental status, unspecified: Secondary | ICD-10-CM | POA: Diagnosis not present

## 2022-07-17 DIAGNOSIS — R413 Other amnesia: Secondary | ICD-10-CM

## 2022-07-17 NOTE — Progress Notes (Signed)
Kindly inform the patient that EEG or brainwave study was normal.  No seizure activity noted.

## 2022-07-21 ENCOUNTER — Inpatient Hospital Stay (HOSPITAL_BASED_OUTPATIENT_CLINIC_OR_DEPARTMENT_OTHER): Payer: Medicare PPO | Admitting: Hematology and Oncology

## 2022-07-21 ENCOUNTER — Inpatient Hospital Stay: Payer: Medicare PPO | Attending: Hematology and Oncology

## 2022-07-21 ENCOUNTER — Other Ambulatory Visit: Payer: Self-pay

## 2022-07-21 VITALS — BP 133/80 | HR 56 | Temp 97.5°F | Resp 16 | Wt 186.7 lb

## 2022-07-21 DIAGNOSIS — C8319 Mantle cell lymphoma, extranodal and solid organ sites: Secondary | ICD-10-CM | POA: Diagnosis not present

## 2022-07-21 DIAGNOSIS — C8315 Mantle cell lymphoma, lymph nodes of inguinal region and lower limb: Secondary | ICD-10-CM | POA: Diagnosis not present

## 2022-07-21 LAB — LACTATE DEHYDROGENASE: LDH: 209 U/L — ABNORMAL HIGH (ref 98–192)

## 2022-07-21 LAB — CBC WITH DIFFERENTIAL (CANCER CENTER ONLY)
Abs Immature Granulocytes: 0.02 10*3/uL (ref 0.00–0.07)
Basophils Absolute: 0.1 10*3/uL (ref 0.0–0.1)
Basophils Relative: 2 %
Eosinophils Absolute: 0 10*3/uL (ref 0.0–0.5)
Eosinophils Relative: 1 %
HCT: 38.2 % — ABNORMAL LOW (ref 39.0–52.0)
Hemoglobin: 12.4 g/dL — ABNORMAL LOW (ref 13.0–17.0)
Immature Granulocytes: 1 %
Lymphocytes Relative: 26 %
Lymphs Abs: 0.8 10*3/uL (ref 0.7–4.0)
MCH: 28.7 pg (ref 26.0–34.0)
MCHC: 32.5 g/dL (ref 30.0–36.0)
MCV: 88.4 fL (ref 80.0–100.0)
Monocytes Absolute: 0.5 10*3/uL (ref 0.1–1.0)
Monocytes Relative: 14 %
Neutro Abs: 1.8 10*3/uL (ref 1.7–7.7)
Neutrophils Relative %: 56 %
Platelet Count: 236 10*3/uL (ref 150–400)
RBC: 4.32 MIL/uL (ref 4.22–5.81)
RDW: 13.9 % (ref 11.5–15.5)
WBC Count: 3.2 10*3/uL — ABNORMAL LOW (ref 4.0–10.5)
nRBC: 0 % (ref 0.0–0.2)

## 2022-07-21 LAB — CMP (CANCER CENTER ONLY)
ALT: 20 U/L (ref 0–44)
AST: 21 U/L (ref 15–41)
Albumin: 4.1 g/dL (ref 3.5–5.0)
Alkaline Phosphatase: 32 U/L — ABNORMAL LOW (ref 38–126)
Anion gap: 7 (ref 5–15)
BUN: 19 mg/dL (ref 8–23)
CO2: 28 mmol/L (ref 22–32)
Calcium: 9.2 mg/dL (ref 8.9–10.3)
Chloride: 105 mmol/L (ref 98–111)
Creatinine: 1.37 mg/dL — ABNORMAL HIGH (ref 0.61–1.24)
GFR, Estimated: 56 mL/min — ABNORMAL LOW (ref >60)
Glucose, Bld: 111 mg/dL — ABNORMAL HIGH (ref 70–99)
Potassium: 4.4 mmol/L (ref 3.5–5.1)
Sodium: 140 mmol/L (ref 135–145)
Total Bilirubin: 0.7 mg/dL (ref 0.3–1.2)
Total Protein: 6.2 g/dL — ABNORMAL LOW (ref 6.5–8.1)

## 2022-07-21 MED ORDER — LORAZEPAM 1 MG PO TABS: 1.0000 mg | ORAL_TABLET | Freq: Once | ORAL | 0 refills | Status: AC

## 2022-07-21 NOTE — Progress Notes (Signed)
[place] [facility] [phone]   [phone]  PROGRESS NOTE  Patient Care Team: Shelda Pal, DO as PCP - General (Family Medicine) Debara Pickett Nadean Corwin, MD as PCP - Cardiology (Cardiology) Vickie Epley, MD as PCP - Electrophysiology (Cardiology) Donnie Mesa, MD as Consulting Physician (General Surgery) Wilford Corner, MD as Consulting Physician (Gastroenterology) Ples Specter, MD as Referring Physician (Internal Medicine) Larina Earthly, MD as Referring Physician (Hematology and Oncology) Grace Isaac, MD (Inactive) as Consulting Physician (Cardiothoracic Surgery) Orson Slick, MD as Consulting Physician (Hematology and Oncology)  Hematological/Oncological History # Marginal Zone Lymphoma, In Remission  (a) s/p coil embolization of the feeding (gastro-duodenal) artery 01/23/2015             (b) anemia--scant iron stores on bone marrow biopsy--s/p feraheme 02/13/2015   (1) right inguinal lymph node biopsy 01/22/2015 confirms mantle cell lymphoma             (a) bone marrow biopsy 02/06/2015 positive for involvement by the patient's mantle cell lymphoma             (b) IPI score of 5 (high risk) predicts a 5 year progression free survival of 50% with CHOP-Rituxan chemotherapy             (c) MIPI score of 5 (intermediate risk) predicts a median survival of 58 months             (d) the cells were CD5 positive, CD19 and 20+, CD23 negative, lambda restricted   (2) CHOP/Rituxan started 02/09/2015, completed 8 cycles 07/05/2015; total doxorubicin dose 300 mg/M2   (3)  [facility] consultation 04/06/2015. Patient opted against transplant consolidation   (4) rituximab maintenance started 08/16/2015, repeat every 2 months, discontinued February 2019             (a) PET scan had shown a suggestion of recurrence, but see (6) below   (5) PSA increase noted July 2017, back to baseline by December 2017             (a) also noted to  be high (13.0) October 2021, resolved with antibiotics   (6) restaging PET scan 11/17/2016 is consistent with disease progression             (a) repeat PET scan 02/02/2017 confirms increase in lymph nodes             (b) PET scan 04/20/2017 shows increasing hypermetabolic adenopathy             (c) bronchoscopy/mediastinoscopy 05/05/2017 shows granulomatous disease, noncaseating, with negative AFB/ GMS stains; <10% of lymphocytes are B-cell with slight lambda excess             (d) PET scan on 07/06/2017 shows mild increase in mediastinal lymphadenopathy. Increased hypermetabolic supraclavicular lymph node.             (e) PET scan 12/24/2017 interpreted as showing evidence of partial response                    (f) PET 09/21/2018 shows decreased size and metabolism of scattered measurable nodes; Deauville 4             (g) PET scan 11/29/2019 essentially unchanged except for increased prostate activity (see #10 below).             (h) thoracic surgery (Dr. Minus Breeding) feels it would be very difficult to obtain a biopsy of the small positive areas in the  most recent scan             (i) PET scan 11/13/2020 shows decreased activity in the previously noted subcentimeter Deauville-4 mediastinal and hilar adenopathy              (7) ibrutinib 420 mg daily started 02/09/2017, discontinued March 2019             (a) resumed April 2019 at 560 mg daily             (b) dose back to 420 mg daily as of May 2020  04/09/2021: Transition care to Dr. Leonides Schanz due to Dr. Darrall Dears retirement.  Patient continuing on ibrutinib 420 mg p.o. daily  Interval History:  [name] 72 y.o. male with medical history significant for mantle cell lymphoma in remission who presents for a follow up visit. The patient's last visit was on 04/21/2022. In the interim since the last visit he has had no major changes in his health.  On exam today [name] reports he has been well overall interim since her last visit 3 months  ago.  He notes his energy levels are good and he wakes about 4 AM to workout.  He notes he did have a small skin infection on the right side of his leg which is "clearing up" on antibiotic therapy prescribed by his PCP.  He notes that he is not having any infectious symptoms though he is a little "hoarse".  He continues taking his ibrutinib 420 mg p.o. daily without any difficulty.  He is not having any missed dosages.  He is not have any palpitations, bleeding, and also reports no enlarged lymph nodes.  He reports he underwent a colonoscopy recently which showed 2 benign polyps.  Overall he feels well and is willing and able to continue on ibrutinib therapy at this time.  He denies any fevers, chills, sweats, nausea, vomiting or diarrhea.  A full 10 point ROS is listed below.  MEDICAL HISTORY:  Past Medical History:  Diagnosis Date   Anemia    Cancer (HCC)    Lymphoma   Essential hypertension    GERD (gastroesophageal reflux disease)    GI bleed    HLD (hyperlipidemia)    Weakness     SURGICAL HISTORY: Past Surgical History:  Procedure Laterality Date   ESOPHAGOGASTRODUODENOSCOPY Left 01/18/2015   Procedure: ESOPHAGOGASTRODUODENOSCOPY (EGD);  Surgeon: Charlott Rakes, MD;  Location: [facility];  Service: Endoscopy;  Laterality: Left;   INGUINAL HERNIA REPAIR Right 01/22/2015   Procedure: RIGHT INGUINAL LYMPH NODE BX;  Surgeon: Manus Rudd, MD;  Location: [facility] OR;  Service: General;  Laterality: Right;   IR REMOVAL TUN ACCESS W/ PORT W/O FL MOD SED  01/24/2022   MEDIASTINOSCOPY N/A 05/05/2017   Procedure: MEDIASTINOSCOPY;  Surgeon: Delight Ovens, MD;  Location: [facility] OR;  Service: Thoracic;  Laterality: N/A;   PORTACATH PLACEMENT  01/26/2015    power port with tip SVC/RA Junction   SKIN SURGERY     Small benign cysts over left scalp removed   VIDEO BRONCHOSCOPY WITH ENDOBRONCHIAL ULTRASOUND N/A 05/05/2017   Procedure: VIDEO BRONCHOSCOPY WITH ENDOBRONCHIAL ULTRASOUND;  Surgeon:  Delight Ovens, MD;  Location: [facility] OR;  Service: Thoracic;  Laterality: N/A;    SOCIAL HISTORY: Social History   Socioeconomic History   Marital status: Married    Spouse name: Not on file   Number of children: Not on file   Years of education: Not on file   Highest education  level: Bachelor's degree (e.g., BA, AB, BS)  Occupational History   Occupation: Retired  Tobacco Use   Smoking status: Never   Smokeless tobacco: Never  Vaping Use   Vaping Use: Never used  Substance and Sexual Activity   Alcohol use: Never    Alcohol/week: 6.0 standard drinks of alcohol    Types: 3 Cans of beer, 3 Shots of liquor per week   Drug use: No   Sexual activity: Yes    Partners: Female  Other Topics Concern   Not on file  Social History Narrative   Not on file   Social Determinants of Health   Financial Resource Strain: Low Risk  (05/23/2022)   Overall Financial Resource Strain (CARDIA)    Difficulty of Paying Living Expenses: Not hard at all  Food Insecurity: No Food Insecurity (05/23/2022)   Hunger Vital Sign    Worried About Running Out of Food in the Last Year: Never true    Ran Out of Food in the Last Year: Never true  Transportation Needs: No Transportation Needs (05/23/2022)   PRAPARE - Administrator, Civil Service (Medical): No    Lack of Transportation (Non-Medical): No  Physical Activity: Sufficiently Active (05/23/2022)   Exercise Vital Sign    Days of Exercise per Week: 5 days    Minutes of Exercise per Session: 30 min  Stress: No Stress Concern Present (04/02/2021)   [employer] of Occupational Health - Occupational Stress Questionnaire    Feeling of Stress : Not at all  Social Connections: Moderately Isolated (05/23/2022)   Social Connection and Isolation Panel [NHANES]    Frequency of Communication with Friends and Family: More than three times a week    Frequency of Social Gatherings with Friends and Family: Once a week    Attends Religious  Services: Never    Database administrator or Organizations: No    Attends Engineer, structural: Not on file    Marital Status: Married  Catering manager Violence: Not At Risk (04/02/2021)   Humiliation, Afraid, Rape, and Kick questionnaire    Fear of Current or Ex-Partner: No    Emotionally Abused: No    Physically Abused: No    Sexually Abused: No    FAMILY HISTORY: Family History  Problem Relation Age of Onset   Hypertension Mother    Hypertension Father    Kidney failure Father    Hypertension Sister    Diabetes Sister    Lupus Sister    Prostate cancer Brother    Lung cancer Brother    Colon cancer Neg Hx    Stomach cancer Neg Hx     ALLERGIES:  has No Known Allergies.  MEDICATIONS:  Current Outpatient Medications  Medication Sig Dispense Refill   folic acid (FOLVITE) 1 MG tablet Take 1 tablet (1 mg total) by mouth daily. 100 tablet 3   LORazepam (ATIVAN) 1 MG tablet Take 1 tablet (1 mg total) by mouth once for 1 dose. 1 tablet 0   apixaban (ELIQUIS) 5 MG TABS tablet TAKE 1 TABLET BY MOUTH TWICE A DAY 180 tablet 1   atorvastatin (LIPITOR) 80 MG tablet Take 1 tablet (80 mg total) by mouth daily. 90 tablet 3   carvedilol (COREG) 12.5 MG tablet Take 1 tablet (12.5 mg total) by mouth 2 (two) times daily with a meal. 180 tablet 3   ibrutinib (IMBRUVICA) 420 MG tablet Take 1 tablet (420 mg) by mouth daily. 28 tablet 6  JARDIANCE 10 MG TABS tablet TAKE 1 TABLET BY MOUTH DAILY BEFORE BREAKFAST. 30 tablet 11   sacubitril-valsartan (ENTRESTO) 97-103 MG Take 1 tablet by mouth 2 (two) times daily. 180 tablet 3   spironolactone (ALDACTONE) 25 MG tablet TAKE 1 TABLET (25 MG TOTAL) BY MOUTH DAILY. 90 tablet 3   No current facility-administered medications for this visit.    REVIEW OF SYSTEMS:   Constitutional: ( - ) fevers, ( - )  chills , ( - ) night sweats Eyes: ( - ) blurriness of vision, ( - ) double vision, ( - ) watery eyes Ears, nose, mouth, throat, and face: ( -  ) mucositis, ( - ) sore throat Respiratory: ( - ) cough, ( - ) dyspnea, ( - ) wheezes Cardiovascular: ( - ) palpitation, ( - ) chest discomfort, ( - ) lower extremity swelling Gastrointestinal:  ( - ) nausea, ( - ) heartburn, ( - ) change in bowel habits Skin: ( - ) abnormal skin rashes Lymphatics: ( - ) new lymphadenopathy, ( - ) easy bruising Neurological: ( - ) numbness, ( - ) tingling, ( - ) new weaknesses Behavioral/Psych: ( - ) mood change, ( - ) new changes  All other systems were reviewed with the patient and are negative.  PHYSICAL EXAMINATION:  Vitals:   07/21/22 0818  BP: 133/80  Pulse: (!) 56  Resp: 16  Temp: (!) 97.5 F (36.4 C)  SpO2: 100%       Filed Weights   07/21/22 0818  Weight: 186 lb 11.2 oz (84.7 kg)     GENERAL: Well-appearing middle-aged African-American male, alert, no distress and comfortable SKIN: skin color, texture, turgor are normal, no rashes or significant lesions EYES: conjunctiva are pink and non-injected, sclera clear LUNGS: clear to auscultation and percussion with normal breathing effort HEART: regular rate & rhythm and no murmurs and no lower extremity edema Musculoskeletal: no cyanosis of digits and no clubbing. Large mobile, soft mass on mid back most consistent with lipoma.  PSYCH: alert & oriented x 3, fluent speech NEURO: no focal motor/sensory deficits  LABORATORY DATA:  I have reviewed the data as listed    Latest Ref Rng & Units 07/21/2022    7:37 AM 04/21/2022    7:31 AM 01/15/2022    9:13 AM  CBC  WBC 4.0 - 10.5 K/uL 3.2  3.2  4.2   Hemoglobin 13.0 - 17.0 g/dL 16.1  09.6  04.5   Hematocrit 39.0 - 52.0 % 38.2  38.3  38.7   Platelets 150 - 400 K/uL 236  252  234        Latest Ref Rng & Units 07/21/2022    7:37 AM 04/21/2022    7:31 AM 03/03/2022    1:51 PM  CMP  Glucose 70 - 99 mg/dL 409  87  88   BUN 8 - 23 mg/dL 19  22  18    Creatinine 0.61 - 1.24 mg/dL 8.11  9.14  7.82   Sodium 135 - 145 mmol/L 140  141  140    Potassium 3.5 - 5.1 mmol/L 4.4  4.4  4.2   Chloride 98 - 111 mmol/L 105  108  104   CO2 22 - 32 mmol/L 28  27  27    Calcium 8.9 - 10.3 mg/dL 9.2  8.8  8.4   Total Protein 6.5 - 8.1 g/dL 6.2  6.7  6.1   Total Bilirubin 0.3 - 1.2 mg/dL 0.7  0.6  0.7  Alkaline Phos 38 - 126 U/L 32  33  42   AST 15 - 41 U/L 21  19  15    ALT 0 - 44 U/L 20  17  14      RADIOGRAPHIC STUDIES: EEG adult  Result Date: 2022-07-26       [facility] [address] [place]. [place] [address] [phone]      Electroencephalogram Procedure Note [name] Date of Birth:  1950/02/22 Medical Record Number:  [id number] Indications: Diagnostic Date of Procedure 26-Jul-2022 Medications: none Clinical history : 72 year old patient being evaluated for memory loss and seizures Technical Description This study was performed using 17 channel digital electroencephalographic recording equipment. International 10-20 electrode placement was used. The record was obtained with the patient awake, drowsy, and asleep.  The record is of good technical quality for purposes of interpretation. Activation Procedures:  hyperventilation and photic stimulation . EEG Description Awake: Alpha Activity: The waking state record contains a well-defined bi-occipital alpha rhythm of  low amplitude with a dominant frequency of 10 hz. Reactivity is uncertain. No paroxsymal activity, spikes, or sharp waves are noted. Technical component of study is adequate. EKG tracing shows regular sinus rhythm Length of this recording is 23 minutes and 15 seconds Sleep: With drowsiness, there is attenuation of the background alpha activity. As the patient enters into light sleep, vertex waves and symmetrical spindles are noted. K complexes are noted in sleep. Transition to the waking state is unremarkable. Result of Activation Procedures: Hyperventilation: Hyperventilation for three minutes fails to activate the recording. Photo Stimulation: No photic  driving response is noted. Summary Normal electroencephalogram, awake, asleep and with activation procedures. There are no focal lateralizing or epileptiform features.    ASSESSMENT & PLAN [name] 72 y.o. male with medical history significant for mantle cell lymphoma in remission who presents for a follow up visit.  #Mantle Cell Lymphoma in Remission -- Prior PET CT scan in October 2022 showed no evidence of residual or recurrent disease, though there were some lymph nodes concerning for sarcoidosis --Patient has been remission since at least 06/11/2015 at which time his PET CT scan showed no evidence of recurrent or residual disease. --PET CT on 05/07/2022,  no clear evidence of relapsed/recurrent disease. Recommend standard CT scan q 6 months. CT scan due Oct 2024 --Labs from today were reviewed and require no intervention. Labs show white blood cell 3.2, hemoglobin 12.4, MCV 88.4, and platelets of 236 --Continue ibrutinib 420 mg p.o. daily --Return to clinic 3 months with labs.   No orders of the defined types were placed in this encounter.   All questions were answered. The patient knows to call the clinic with any problems, questions or concerns.  I have spent a total of 30 minutes minutes of face-to-face and non-face-to-face time, preparing to see the patient performing a medically appropriate examination, counseling and educating the patient, ordering tests, documenting clinical information in the electronic health record, and care coordination.   Gary Barns, MD Department of Hematology/Oncology [facility] Phone: [phone] Pager: [phone] Email: [email or web address]  07/21/2022 2:13 PM

## 2022-07-22 ENCOUNTER — Telehealth: Payer: Self-pay | Admitting: Hematology and Oncology

## 2022-07-23 ENCOUNTER — Ambulatory Visit
Admission: RE | Admit: 2022-07-23 | Discharge: 2022-07-23 | Disposition: A | Discharge status: Home / Self Care | Payer: Medicare PPO | Source: Ambulatory Visit | Attending: Neurology | Admitting: Neurology

## 2022-07-23 DIAGNOSIS — R413 Other amnesia: Secondary | ICD-10-CM | POA: Diagnosis not present

## 2022-07-23 MED ORDER — GADOPICLENOL 0.5 MMOL/ML IV SOLN
9.0000 mL | Freq: Once | INTRAVENOUS | Status: AC | PRN
  Administered 2022-07-23: 9 mL via INTRAVENOUS

## 2022-08-01 NOTE — Progress Notes (Signed)
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 No new findings

## 2022-08-04 ENCOUNTER — Telehealth: Payer: Self-pay

## 2022-08-04 ENCOUNTER — Other Ambulatory Visit (HOSPITAL_COMMUNITY): Payer: Self-pay

## 2022-08-04 NOTE — Telephone Encounter (Signed)
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 No new findings

## 2022-08-12 ENCOUNTER — Other Ambulatory Visit (HOSPITAL_COMMUNITY): Payer: Self-pay

## 2022-09-04 ENCOUNTER — Other Ambulatory Visit (HOSPITAL_COMMUNITY): Payer: Self-pay

## 2022-09-09 ENCOUNTER — Other Ambulatory Visit: Payer: Self-pay

## 2022-10-02 ENCOUNTER — Other Ambulatory Visit (HOSPITAL_COMMUNITY): Payer: Self-pay

## 2022-10-03 ENCOUNTER — Other Ambulatory Visit (HOSPITAL_COMMUNITY): Payer: Self-pay

## 2022-10-29 ENCOUNTER — Other Ambulatory Visit: Payer: Self-pay

## 2022-10-30 NOTE — Progress Notes (Signed)
[facility] [address] [place]. [place] [address] [phone]       OFFICE  FOLLOW UP NOTE  [name] Date of Birth:  03-Jan-1951 Medical Record Number:  [id number]     Primary neurologist: Dr. Pearlean Brownie Reason for visit: Memory loss complaints   Chief Complaint  Patient presents with   Follow-up    Patient in room #3 and alone. Patient states he is well and stable, with no new concerns. Patient states he's here to foloow on his stroke and memory.      HPI:   Update 11/03/2022 JM: Patient returns for follow-up visit unaccompanied.  Reports overall stable since prior visit.  Denies any worsening of memory. More difficulty with short term memory and with remembering names, usually will come back to him eventually.  Continues to maintain ADLs and IADLs independently.  Still drives, does have some difficulty with directions but denies being lost. Still doing yard work, enjoys mowing yards. Routinely works out with light weights 5x per week at his home. Sleeping well. Good appetite. MRI brain showed old strokes and small vessel disease but no new or acute findings. EEG negative.  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 Denies new stroke/TIA symptoms.  Routinely follows with PCP and cardiology for stroke risk factor management.  Continues to follow with oncology for hx of mantle cell lymphoma in remission since 2017, has repeat scan next month for surveillance monitoring.    History provided for reference purposes only Consult visit 07/01/2022 Dr. Pearlean Brownie: [name] is a pleasant 72 year old African-American male seen today for consultation visit for memory loss.  He is accompanied by his wife.  History is obtained from them and review of electronic medical records.  I personally reviewed pertinent available imaging films in PACS.  He has past medical  history of nonobstructive coronary artery disease, chronic systolic heart failure, paroxysmal A-fib, asymptomatic left carotid stenosis, ascending aortic aneurysm, hypertension, hyperlipidemia, right pontine infarct in June 2021, mantle cell lymphoma controlled on chemotherapy.  Patient states for the last 1 year has noticed memory difficulties.  Wife has noticed that he is at times forgets people's names remember it later.  He also has trouble remembering directions and often forgets so he is Back to driving.  He is otherwise independent in activities of daily living.  He can feed himself, dress himself use the restroom.  In fact works out in the yard.  Patient denies significant headaches, seizures, loss of consciousness or any recent stroke or TIA episodes.  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  He is asymptomatic moderate left carotid stenosis which has remained stable over the years and last carotid ultrasound on 05/01/2022 showed 60 to 79% left ICA stenosis.  Lab work on 02/23/2022 showed LDL cholesterol to be 75 mg percent.  Hemoglobin A1c on 04/09/2021 was 5.6.  Patient remains on Eliquis which is tolerating well with only minor bruising and no bleeding.  States his tolerating Lipitor well without side effects.  He does follow-up with his cardiologist regularly for his congestive heart failure and last echo on 04/16/2021 had shown improved ejection fraction to 45 to 50%.  He has no family history of Alzheimer's dementia.  He has had no recent brain imaging studies done or any lab work for reversible causes checked.    ROS:  14 system review of systems is positive for those listed in HPI and all other systems negative  PMH:  Past Medical History:  Diagnosis Date   Anemia    Cancer (HCC)    Lymphoma   Essential hypertension    GERD (gastroesophageal reflux disease)    GI bleed    HLD (hyperlipidemia)    Weakness     Social History:  Social  History   Socioeconomic History   Marital status: Married    Spouse name: Not on file   Number of children: Not on file   Years of education: Not on file   Highest education level: Bachelor's degree (e.g., BA, AB, BS)  Occupational History   Occupation: Retired  Tobacco Use   Smoking status: Never   Smokeless tobacco: Never  Vaping Use   Vaping status: Never Used  Substance and Sexual Activity   Alcohol use: Never    Alcohol/week: 6.0 standard drinks of alcohol    Types: 3 Cans of beer, 3 Shots of liquor per week   Drug use: No   Sexual activity: Yes    Partners: Female  Other Topics Concern   Not on file  Social History Narrative   Not on file   Social Determinants of Health   Financial Resource Strain: Low Risk  (05/23/2022)   Overall Financial Resource Strain (CARDIA)    Difficulty of Paying Living Expenses: Not hard at all  Food Insecurity: No Food Insecurity (05/23/2022)   Hunger Vital Sign    Worried About Running Out of Food in the Last Year: Never true    Ran Out of Food in the Last Year: Never true  Transportation Needs: No Transportation Needs (05/23/2022)   PRAPARE - Administrator, Civil Service (Medical): No    Lack of Transportation (Non-Medical): No  Physical Activity: Sufficiently Active (05/23/2022)   Exercise Vital Sign    Days of Exercise per Week: 5 days    Minutes of Exercise per Session: 30 min  Stress: No Stress Concern Present (04/02/2021)   [employer] of Occupational Health - Occupational Stress Questionnaire    Feeling of Stress : Not at all  Social Connections: Moderately Isolated (05/23/2022)   Social Connection and Isolation Panel [NHANES]    Frequency of Communication with Friends and Family: More than three times a week    Frequency of Social Gatherings with Friends and Family: Once a week    Attends Religious Services: Never    Database administrator or Organizations: No    Attends Engineer, structural: Not on  file    Marital Status: Married  Catering manager Violence: Not At Risk (04/02/2021)   Humiliation, Afraid, Rape, and Kick questionnaire    Fear of Current or Ex-Partner: No    Emotionally Abused: No    Physically Abused: No    Sexually Abused: No    Medications:   Current Outpatient Medications on File Prior to Visit  Medication Sig Dispense Refill   apixaban (ELIQUIS) 5 MG TABS tablet TAKE 1 TABLET BY MOUTH TWICE A DAY 180 tablet 1   atorvastatin (LIPITOR) 80 MG tablet Take 1 tablet (80 mg total) by mouth daily. 90 tablet 3   carvedilol (COREG) 12.5 MG tablet Take 1 tablet (12.5 mg total) by mouth 2 (two) times daily with a meal. 180 tablet 3   folic acid (FOLVITE) 1 MG tablet Take 1 tablet (1 mg total) by mouth daily. 100 tablet 3   ibrutinib (  IMBRUVICA) 420 MG tablet Take 1 tablet (420 mg) by mouth daily. 28 tablet 6   JARDIANCE 10 MG TABS tablet TAKE 1 TABLET BY MOUTH DAILY BEFORE BREAKFAST. 30 tablet 11   sacubitril-valsartan (ENTRESTO) 97-103 MG Take 1 tablet by mouth 2 (two) times daily. 180 tablet 3   spironolactone (ALDACTONE) 25 MG tablet TAKE 1 TABLET (25 MG TOTAL) BY MOUTH DAILY. 90 tablet 3   No current facility-administered medications on file prior to visit.    Allergies:  No Known Allergies  Physical Exam Today's Vitals   11/03/22 0729  BP: 115/63  Pulse: (!) 53  Weight: 186 lb 6.4 oz (84.6 kg)  Height: 5\' 10"  (1.778 m)   Body mass index is 26.75 kg/m.   General: well developed, well nourished very pleasant middle-aged African-American male, seated, in no evident distress Head: head normocephalic and atraumatic.   Neck: supple with no carotid or supraclavicular bruits Cardiovascular: regular rate and rhythm, no murmurs Musculoskeletal: no deformity Skin:  no rash/petichiae Vascular:  Normal pulses all extremities  Neurologic Exam Mental Status: Awake and fully alert.  Fluent speech and language.  Oriented to place and time. Recent and remote memory  intact. Attention span, concentration and fund of knowledge appropriate. Mood and affect appropriate.   Cranial Nerves: Pupils equal, briskly reactive to light. Extraocular movements full without nystagmus. Visual fields full to confrontation. Hearing intact. Facial sensation intact. Face, tongue, palate moves normally and symmetrically.  Motor: Normal bulk and tone. Normal strength in all tested extremity muscles. Sensory.: intact to touch , pinprick , position and vibratory sensation.  Coordination: Rapid alternating movements normal in all extremities. Finger-to-nose and heel-to-shin performed accurately bilaterally. Gait and Station: Arises from chair without difficulty. Stance is normal. Gait demonstrates normal stride length and balance . Able to heel, toe and tandem walk without difficulty.  Reflexes: 1+ and symmetric. Toes downgoing.      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  Remote history of right pontine stroke in June 2021.  Vascular risk factors paroxysmal A-fib, heart failure, and moderate asymptomatic left carotid stenosis.     PLAN:  Mild cognitive impairment -Reported mild cognitive impairment "for many years" in 2021 -MMSE today 29/30 (prior 30/30) -if cognition starts to progress, could consider trial of cholinesterase inhibitors and/or neurocognitive evaluation -Discussed importance of continued routine physical and cognitive exercises as  well as managing stroke risk factors, ensuring good sleep and healthy diet -Dementia panel showed slightly elevated homocystine, continue folic acid 1 mg daily, repeat level today -MRI brain 07/2022 chronic infarcts, chronic small vessel disease, no new findings since 2021 imaging -EEG 07/2022 normal   History of stroke -Continue Eliquis and atorvastatin for secondary stroke prevention measures managed/prescribed by PCP/cardiology -Continue to follow with cardiology and PCP for aggressive stroke risk factor management including BP goal<130/90 and HLD with LDL  goal<70   Left carotid stenosis -Followed by cardiology Dr. Shirlee Latch, previously planned on repeating this month but may not be needed now as completed 6 months ago - advised to contact their office to schedule f/u visit -Carotid ultrasound 04/2022 left ICA 60 to 79% stenosis -Carotid ultrasound 11/2021 60 to 79% stenosis    Follow-up in 1 year or call earlier if needed, if remains stable, can f/u as needed    I spent 30 minutes of face-to-face and non-face-to-face time with patient.  This included previsit chart review, lab review, study review, order entry, electronic health record documentation, patient education and discussion regarding above diagnoses and treatment plan and answered all other questions to patient's satisfaction  Ihor Austin, [facility]  [facility] [address] [place], [place] [address]  Phone [phone] Fax [phone] Note: This document was prepared with digital dictation and possible smart phrase technology. Any transcriptional errors that result from this process are unintentional.

## 2022-11-03 ENCOUNTER — Other Ambulatory Visit: Payer: Self-pay

## 2022-11-03 ENCOUNTER — Other Ambulatory Visit: Payer: Self-pay | Admitting: Hematology and Oncology

## 2022-11-03 ENCOUNTER — Encounter: Payer: Self-pay | Admitting: Adult Health

## 2022-11-03 ENCOUNTER — Ambulatory Visit: Payer: Medicare PPO | Admitting: Adult Health

## 2022-11-03 VITALS — BP 115/63 | HR 53 | Ht 70.0 in | Wt 186.4 lb

## 2022-11-03 DIAGNOSIS — Z8673 Personal history of transient ischemic attack (TIA), and cerebral infarction without residual deficits: Secondary | ICD-10-CM | POA: Diagnosis not present

## 2022-11-03 DIAGNOSIS — R7983 Abnormal findings of blood amino-acid level: Secondary | ICD-10-CM | POA: Diagnosis not present

## 2022-11-03 DIAGNOSIS — G3184 Mild cognitive impairment, so stated: Secondary | ICD-10-CM

## 2022-11-03 MED ORDER — IBRUTINIB 420 MG PO TABS
420.0000 mg | ORAL_TABLET | Freq: Every day | ORAL | 6 refills | Status: DC
  Filled 2022-11-03: qty 28, 28d supply, fill #0
  Filled 2022-11-24: qty 28, 28d supply, fill #1
  Filled 2022-12-23: qty 28, 28d supply, fill #2
  Filled 2023-01-12: qty 28, 28d supply, fill #3
  Filled 2023-02-05: qty 28, 28d supply, fill #4
  Filled 2023-03-03: qty 28, 28d supply, fill #5
  Filled 2023-03-30: qty 28, 28d supply, fill #6

## 2022-11-03 NOTE — Progress Notes (Signed)
Specialty Pharmacy Refill Coordination Note  [name] is a 72 y.o. male contacted today regarding refills of specialty medication(s) No data recorded.  Patient requested Delivery  on 11/06/22  to verified address [address]   Medication is pending refill approval and will be filled on 11/05/22. Patient will be notified of any delays

## 2022-11-03 NOTE — Patient Instructions (Addendum)
Your Plan:  Your repeat memory test today is stable compared to prior visit's testing   Continue folic acid 1 mg daily, will recheck homocystine level today  Ensure routine physical and cognitive activities, ensure healthy diet, good sleep and routine socialization  Continue to follow with your PCP and cardiologist for continued stroke risk factor management  Please contact your cardiologist regarding repeat carotid ultrasound and ongoing monitoring     Follow up in 1 year or call earlier if needed     Thank you for coming to see [place] at [facility]. I hope we have been able to provide you high quality care today.  You may receive a patient satisfaction survey over the next few weeks. We would appreciate your feedback and comments so that we may continue to improve ourselves and the health of our patients.

## 2022-11-04 LAB — HOMOCYSTEINE: Homocysteine: 15.7 umol/L (ref 0.0–17.2)

## 2022-11-06 ENCOUNTER — Telehealth: Payer: Self-pay | Admitting: *Deleted

## 2022-11-06 NOTE — Telephone Encounter (Signed)
Received call from pt. He states he is scheduled to have a CT scan on the 10th. He states he normally takes an Ativan prior to scan due to claustrophobia. He is asking for  this to be prescribed to his [facility]  Dr. Leonides Schanz made aware.

## 2022-11-07 ENCOUNTER — Other Ambulatory Visit: Payer: Self-pay | Admitting: Hematology and Oncology

## 2022-11-07 MED ORDER — LORAZEPAM 1 MG PO TABS: 1.0000 mg | ORAL_TABLET | Freq: Once | ORAL | 0 refills | Status: AC

## 2022-11-13 ENCOUNTER — Ambulatory Visit (HOSPITAL_COMMUNITY)
Admission: RE | Admit: 2022-11-13 | Discharge: 2022-11-13 | Disposition: A | Discharge status: Home / Self Care | Payer: Medicare PPO | Source: Ambulatory Visit | Attending: Hematology and Oncology | Admitting: Hematology and Oncology

## 2022-11-13 DIAGNOSIS — N400 Enlarged prostate without lower urinary tract symptoms: Secondary | ICD-10-CM | POA: Diagnosis not present

## 2022-11-13 DIAGNOSIS — I77810 Thoracic aortic ectasia: Secondary | ICD-10-CM | POA: Diagnosis not present

## 2022-11-13 DIAGNOSIS — C8319 Mantle cell lymphoma, extranodal and solid organ sites: Secondary | ICD-10-CM | POA: Diagnosis not present

## 2022-11-13 DIAGNOSIS — Z8572 Personal history of non-Hodgkin lymphomas: Secondary | ICD-10-CM | POA: Diagnosis not present

## 2022-11-13 MED ORDER — IOHEXOL 300 MG/ML  SOLN
100.0000 mL | Freq: Once | INTRAMUSCULAR | Status: AC | PRN
  Administered 2022-11-13: 100 mL via INTRAVENOUS

## 2022-11-13 MED ORDER — IOHEXOL 9 MG/ML PO SOLN
ORAL | Status: AC
  Filled 2022-11-13: qty 1000

## 2022-11-13 MED ORDER — IOHEXOL 9 MG/ML PO SOLN
1000.0000 mL | ORAL | Status: AC
  Administered 2022-11-13: 1000 mL via ORAL

## 2022-11-13 MED ORDER — SODIUM CHLORIDE (PF) 0.9 % IJ SOLN
INTRAMUSCULAR | Status: AC
  Filled 2022-11-13: qty 50

## 2022-11-16 NOTE — Progress Notes (Unsigned)
[facility] [phone]   [phone]  PROGRESS NOTE  Patient Care Team: Sharlene Dory, DO as PCP - General (Family Medicine) Rennis Golden Lisette Abu, MD as PCP - Cardiology (Cardiology) Lanier Prude, MD as PCP - Electrophysiology (Cardiology) Manus Rudd, MD as Consulting Physician (General Surgery) Charlott Rakes, MD as Consulting Physician (Gastroenterology) Brown Human, MD as Referring Physician (Internal Medicine) Rolanda Lundborg, MD as Referring Physician (Hematology and Oncology) Delight Ovens, MD (Inactive) as Consulting Physician (Cardiothoracic Surgery) Jaci Standard, MD as Consulting Physician (Hematology and Oncology)  Hematological/Oncological History # Marginal Zone Lymphoma, In Remission  (a) s/p coil embolization of the feeding (gastro-duodenal) artery 01/23/2015             (b) anemia--scant iron stores on bone marrow biopsy--s/p feraheme 02/13/2015   (1) right inguinal lymph node biopsy 01/22/2015 confirms mantle cell lymphoma             (a) bone marrow biopsy 02/06/2015 positive for involvement by the patient's mantle cell lymphoma             (b) IPI score of 5 (high risk) predicts a 5 year progression free survival of 50% with CHOP-Rituxan chemotherapy             (c) MIPI score of 5 (intermediate risk) predicts a median survival of 58 months             (d) the cells were CD5 positive, CD19 and 20+, CD23 negative, lambda restricted   (2) CHOP/Rituxan started 02/09/2015, completed 8 cycles 07/05/2015; total doxorubicin dose 300 mg/M2   (3)  [facility] consultation 04/06/2015. Patient opted against transplant consolidation   (4) rituximab maintenance started 08/16/2015, repeat every 2 months, discontinued February 2019             (a) PET scan had shown a suggestion of recurrence, but see (6) below   (5) PSA increase noted July 2017, back to baseline by December 2017             (a) also noted to  be high (13.0) October 2021, resolved with antibiotics   (6) restaging PET scan 11/17/2016 is consistent with disease progression             (a) repeat PET scan 02/02/2017 confirms increase in lymph nodes             (b) PET scan 04/20/2017 shows increasing hypermetabolic adenopathy             (c) bronchoscopy/mediastinoscopy 05/05/2017 shows granulomatous disease, noncaseating, with negative AFB/ GMS stains; <10% of lymphocytes are B-cell with slight lambda excess             (d) PET scan on 07/06/2017 shows mild increase in mediastinal lymphadenopathy. Increased hypermetabolic supraclavicular lymph node.             (e) PET scan 12/24/2017 interpreted as showing evidence of partial response                    (f) PET 09/21/2018 shows decreased size and metabolism of scattered measurable nodes; Deauville 4             (g) PET scan 11/29/2019 essentially unchanged except for increased prostate activity (see #10 below).             (h) thoracic surgery (Dr. Dewaine Conger) feels it would be very difficult to obtain a biopsy of the small positive areas in the  most recent scan             (i) PET scan 11/13/2020 shows decreased activity in the previously noted subcentimeter Deauville-4 mediastinal and hilar adenopathy              (7) ibrutinib 420 mg daily started 02/09/2017, discontinued March 2019             (a) resumed April 2019 at 560 mg daily             (b) dose back to 420 mg daily as of May 2020  04/09/2021: Transition care to Dr. Leonides Schanz due to Dr. Darrall Dears retirement.  Patient continuing on ibrutinib 420 mg p.o. daily  Interval History:  [name] 72 y.o. male with medical history significant for mantle cell lymphoma in remission who presents for a follow up visit. The patient's last visit was on 07/21/2022. In the interim since the last visit he has had no major changes in his health.  On exam today [name] reports he has been well overall in the interim since her last visit 3  months ago.  He reports he is cutting the grass and working outside.  He reports he is keeping quite active and working out.  He notes that he is trying not to work out as heavy as he was previously.  His energy is about an 8 out of 10 today.  His appetite is quite good and he feels like he is "eating constantly".  He notes that he is tolerating his ibrutinib well with no bleeding, bruising, or dark stools.  He reports that he is not having any palpitations or recent infections.  Overall he feels well and is willing and able to continue on ibrutinib therapy at this time.  He denies any fevers, chills, sweats, nausea, vomiting or diarrhea.  A full 10 point ROS is listed below.  MEDICAL HISTORY:  Past Medical History:  Diagnosis Date   Anemia    Cancer (HCC)    Lymphoma   Essential hypertension    GERD (gastroesophageal reflux disease)    GI bleed    HLD (hyperlipidemia)    Weakness     SURGICAL HISTORY: Past Surgical History:  Procedure Laterality Date   ESOPHAGOGASTRODUODENOSCOPY Left 01/18/2015   Procedure: ESOPHAGOGASTRODUODENOSCOPY (EGD);  Surgeon: Charlott Rakes, MD;  Location: [facility] ENDOSCOPY;  Service: Endoscopy;  Laterality: Left;   INGUINAL HERNIA REPAIR Right 01/22/2015   Procedure: RIGHT INGUINAL LYMPH NODE BX;  Surgeon: Manus Rudd, MD;  Location: [facility] OR;  Service: General;  Laterality: Right;   IR REMOVAL TUN ACCESS W/ PORT W/O FL MOD SED  01/24/2022   MEDIASTINOSCOPY N/A 05/05/2017   Procedure: MEDIASTINOSCOPY;  Surgeon: Delight Ovens, MD;  Location: [facility] OR;  Service: Thoracic;  Laterality: N/A;   PORTACATH PLACEMENT  01/26/2015    power port with tip SVC/RA Junction   SKIN SURGERY     Small benign cysts over left scalp removed   VIDEO BRONCHOSCOPY WITH ENDOBRONCHIAL ULTRASOUND N/A 05/05/2017   Procedure: VIDEO BRONCHOSCOPY WITH ENDOBRONCHIAL ULTRASOUND;  Surgeon: Delight Ovens, MD;  Location: [facility] OR;  Service: Thoracic;  Laterality: N/A;    SOCIAL HISTORY: Social  History   Socioeconomic History   Marital status: Married    Spouse name: Not on file   Number of children: Not on file   Years of education: Not on file   Highest education level: Bachelor's degree (e.g., BA, AB, BS)  Occupational History   Occupation: Retired  Tobacco Use   Smoking status: Never   Smokeless tobacco: Never  Vaping Use   Vaping status: Never Used  Substance and Sexual Activity   Alcohol use: Never    Alcohol/week: 6.0 standard drinks of alcohol    Types: 3 Cans of beer, 3 Shots of liquor per week   Drug use: No   Sexual activity: Yes    Partners: Female  Other Topics Concern   Not on file  Social History Narrative   Not on file   Social Determinants of Health   Financial Resource Strain: Low Risk  (05/23/2022)   Overall Financial Resource Strain (CARDIA)    Difficulty of Paying Living Expenses: Not hard at all  Food Insecurity: No Food Insecurity (05/23/2022)   Hunger Vital Sign    Worried About Running Out of Food in the Last Year: Never true    Ran Out of Food in the Last Year: Never true  Transportation Needs: No Transportation Needs (05/23/2022)   PRAPARE - Administrator, Civil Service (Medical): No    Lack of Transportation (Non-Medical): No  Physical Activity: Sufficiently Active (05/23/2022)   Exercise Vital Sign    Days of Exercise per Week: 5 days    Minutes of Exercise per Session: 30 min  Stress: No Stress Concern Present (04/02/2021)   [employer] of Occupational Health - Occupational Stress Questionnaire    Feeling of Stress : Not at all  Social Connections: Moderately Isolated (05/23/2022)   Social Connection and Isolation Panel [NHANES]    Frequency of Communication with Friends and Family: More than three times a week    Frequency of Social Gatherings with Friends and Family: Once a week    Attends Religious Services: Never    Database administrator or Organizations: No    Attends Engineer, structural: Not on  file    Marital Status: Married  Catering manager Violence: Not At Risk (04/02/2021)   Humiliation, Afraid, Rape, and Kick questionnaire    Fear of Current or Ex-Partner: No    Emotionally Abused: No    Physically Abused: No    Sexually Abused: No    FAMILY HISTORY: Family History  Problem Relation Age of Onset   Hypertension Mother    Hypertension Father    Kidney failure Father    Hypertension Sister    Diabetes Sister    Lupus Sister    Prostate cancer Brother    Lung cancer Brother    Colon cancer Neg Hx    Stomach cancer Neg Hx     ALLERGIES:  has No Known Allergies.  MEDICATIONS:  Current Outpatient Medications  Medication Sig Dispense Refill   apixaban (ELIQUIS) 5 MG TABS tablet TAKE 1 TABLET BY MOUTH TWICE A DAY 180 tablet 1   atorvastatin (LIPITOR) 80 MG tablet Take 1 tablet (80 mg total) by mouth daily. 90 tablet 3   carvedilol (COREG) 12.5 MG tablet Take 1 tablet (12.5 mg total) by mouth 2 (two) times daily with a meal. 180 tablet 3   folic acid (FOLVITE) 1 MG tablet Take 1 tablet (1 mg total) by mouth daily. 100 tablet 3   ibrutinib (IMBRUVICA) 420 MG tablet Take 1 tablet (420 mg) by mouth daily. 28 tablet 6   JARDIANCE 10 MG TABS tablet TAKE 1 TABLET BY MOUTH DAILY BEFORE BREAKFAST. 30 tablet 11   sacubitril-valsartan (ENTRESTO) 97-103 MG Take 1 tablet by mouth 2 (two) times daily. 180 tablet 3   spironolactone (  ALDACTONE) 25 MG tablet TAKE 1 TABLET (25 MG TOTAL) BY MOUTH DAILY. 90 tablet 3   No current facility-administered medications for this visit.    REVIEW OF SYSTEMS:   Constitutional: ( - ) fevers, ( - )  chills , ( - ) night sweats Eyes: ( - ) blurriness of vision, ( - ) double vision, ( - ) watery eyes Ears, nose, mouth, throat, and face: ( - ) mucositis, ( - ) sore throat Respiratory: ( - ) cough, ( - ) dyspnea, ( - ) wheezes Cardiovascular: ( - ) palpitation, ( - ) chest discomfort, ( - ) lower extremity swelling Gastrointestinal:  ( - ) nausea,  ( - ) heartburn, ( - ) change in bowel habits Skin: ( - ) abnormal skin rashes Lymphatics: ( - ) new lymphadenopathy, ( - ) easy bruising Neurological: ( - ) numbness, ( - ) tingling, ( - ) new weaknesses Behavioral/Psych: ( - ) mood change, ( - ) new changes  All other systems were reviewed with the patient and are negative.  PHYSICAL EXAMINATION:  Vitals:   11/17/22 1022  BP: 137/77  Pulse: (!) 59  Resp: 16  Temp: 97.8 F (36.6 C)  SpO2: 99%   Filed Weights   11/17/22 1022  Weight: 186 lb 11.2 oz (84.7 kg)    GENERAL: Well-appearing middle-aged African-American male, alert, no distress and comfortable SKIN: skin color, texture, turgor are normal, no rashes or significant lesions EYES: conjunctiva are pink and non-injected, sclera clear LUNGS: clear to auscultation and percussion with normal breathing effort HEART: regular rate & rhythm and no murmurs and no lower extremity edema Musculoskeletal: no cyanosis of digits and no clubbing. Large mobile, soft mass on mid back most consistent with lipoma.  PSYCH: alert & oriented x 3, fluent speech NEURO: no focal motor/sensory deficits  LABORATORY DATA:  I have reviewed the data as listed    Latest Ref Rng & Units 11/17/2022    9:21 AM 07/21/2022    7:37 AM 04/21/2022    7:31 AM  CBC  WBC 4.0 - 10.5 K/uL 4.0  3.2  3.2   Hemoglobin 13.0 - 17.0 g/dL 81.1  91.4  78.2   Hematocrit 39.0 - 52.0 % 35.5  38.2  38.3   Platelets 150 - 400 K/uL 233  236  252        Latest Ref Rng & Units 11/17/2022    9:21 AM 07/21/2022    7:37 AM 04/21/2022    7:31 AM  CMP  Glucose 70 - 99 mg/dL 98  956  87   BUN 8 - 23 mg/dL 19  19  22    Creatinine 0.61 - 1.24 mg/dL 2.13  0.86  5.78   Sodium 135 - 145 mmol/L 139  140  141   Potassium 3.5 - 5.1 mmol/L 4.3  4.4  4.4   Chloride 98 - 111 mmol/L 106  105  108   CO2 22 - 32 mmol/L 28  28  27    Calcium 8.9 - 10.3 mg/dL 9.3  9.2  8.8   Total Protein 6.5 - 8.1 g/dL 6.6  6.2  6.7   Total Bilirubin  0.3 - 1.2 mg/dL 1.0  0.7  0.6   Alkaline Phos 38 - 126 U/L 28  32  33   AST 15 - 41 U/L 19  21  19    ALT 0 - 44 U/L 17  20  17      RADIOGRAPHIC STUDIES: CT  CHEST ABDOMEN PELVIS W CONTRAST  Result Date: 11/13/2022 CLINICAL DATA:  History of mantle cell lymphoma, monitor. * Tracking Code: BO * EXAM: CT CHEST, ABDOMEN, AND PELVIS WITH CONTRAST TECHNIQUE: Multidetector CT imaging of the chest, abdomen and pelvis was performed following the standard protocol during bolus administration of intravenous contrast. RADIATION DOSE REDUCTION: This exam was performed according to the departmental dose-optimization program which includes automated exposure control, adjustment of the mA and/or kV according to patient size and/or use of iterative reconstruction technique. CONTRAST:  OMNIPAQUE IOHEXOL 300 MG/ML  SOLN COMPARISON:  Multiple priors including CT May 07, 2022 FINDINGS: CT CHEST FINDINGS Cardiovascular: Aortic atherosclerosis. 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 No central pulmonary embolus on this nondedicated study. Normal size heart. No significant pericardial effusion/thickening. Three-vessel coronary artery calcifications. Mediastinum/Nodes: No suspicious thyroid nodule. No pathologically enlarged mediastinal, hilar or axillary lymph nodes. The esophagus is grossly unremarkable. Lungs/Pleura: Irregular shaped area of nodularity measuring 12 x 6 mm in the right upper lobe on image 48/4 previously measured 13 x 7 mm. No new suspicious pulmonary nodules or masses. Similar nodular band of scarring in the right middle lobe on image 115/4. No pleural effusion. No pneumothorax. Musculoskeletal: No aggressive lytic or blastic lesion of bone. CT ABDOMEN PELVIS FINDINGS Hepatobiliary: No suspicious hepatic lesion. Gallbladder is unremarkable. No biliary ductal dilation. Pancreas: No pancreatic ductal dilation or evidence of acute inflammation. Spleen: No splenomegaly.  Adrenals/Urinary Tract: Bilateral adrenal glands appear normal. No hydronephrosis. Kidneys demonstrate symmetric enhancement. Urinary bladder is unremarkable for degree of distension. Stomach/Bowel: Radiopaque enteric contrast material traverses the ascending colon. Stomach is minimally distended limiting evaluation. No pathologic dilation of small or large bowel. Normal appendix. No evidence of acute bowel inflammation. Vascular/Lymphatic: Aortic atherosclerosis. Smooth IVC contours. The portal, splenic and superior mesenteric veins are patent. Embolization coils are noted in the upper abdomen adjacent to the head of the pancreas in the gastric pylorus. No pathologically enlarged abdominal or pelvic lymph nodes. Reproductive: Enlarged prostate gland. Other: No significant abdominopelvic free fluid. Musculoskeletal: No aggressive lytic or blastic lesion of bone. Multilevel degenerative change of the spine IMPRESSION: 1. No pathologically enlarged lymph nodes in the chest, abdomen or pelvis. No splenomegaly. 2. Irregular shaped area of nodularity measuring 12 x 6 mm in the right upper lobe previously measured 13 x 7 mm, nonspecific. Suggest continued attention on follow-up imaging. 3. 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 4.  Aortic Atherosclerosis (ICD10-I70.0). Electronically Signed   By: Maudry Mayhew M.D.   On: 11/13/2022 10:42    ASSESSMENT & PLAN MYNOR WITKOP 72 y.o. male with medical history significant for mantle cell lymphoma in remission who presents for a follow up visit.  #Mantle Cell Lymphoma in Remission -- Prior PET CT scan in October 2022 showed no evidence of residual or recurrent disease, though there were some lymph nodes concerning for sarcoidosis --Patient has been remission since at least 06/11/2015 at which time his PET CT scan showed no evidence of recurrent or residual  disease. --PET CT on 05/07/2022,  no clear evidence of relapsed/recurrent disease. Recommend standard CT scan q 6 months. CT scan due Oct 2024 --Labs from today were reviewed and require no intervention. Labs show white blood cell 4.0, hemoglobin 11.6, MCV 89.9, and platelets of 233 --Continue ibrutinib 420 mg p.o. daily --Return to clinic 3 months with labs.   No orders  of the defined types were placed in this encounter.   All questions were answered. The patient knows to call the clinic with any problems, questions or concerns.  I have spent a total of 30 minutes minutes of face-to-face and non-face-to-face time, preparing to see the patient performing a medically appropriate examination, counseling and educating the patient, ordering tests, documenting clinical information in the electronic health record, and care coordination.   Ulysees Barns, MD Department of Hematology/Oncology [facility] Phone: [phone] Pager: [phone] Email: [email or web address]  11/17/2022 1:52 PM

## 2022-11-17 ENCOUNTER — Inpatient Hospital Stay (HOSPITAL_BASED_OUTPATIENT_CLINIC_OR_DEPARTMENT_OTHER): Payer: Medicare PPO | Admitting: Hematology and Oncology

## 2022-11-17 ENCOUNTER — Inpatient Hospital Stay: Payer: Medicare PPO | Attending: Hematology and Oncology

## 2022-11-17 VITALS — BP 137/77 | HR 59 | Temp 97.8°F | Resp 16 | Wt 186.7 lb

## 2022-11-17 DIAGNOSIS — I10 Essential (primary) hypertension: Secondary | ICD-10-CM | POA: Insufficient documentation

## 2022-11-17 DIAGNOSIS — C884 Extranodal marginal zone b-cell lymphoma of mucosa-associated lymphoid tissue (malt-lymphoma) not having achieved remission: Secondary | ICD-10-CM | POA: Diagnosis present

## 2022-11-17 DIAGNOSIS — Z841 Family history of disorders of kidney and ureter: Secondary | ICD-10-CM | POA: Diagnosis not present

## 2022-11-17 DIAGNOSIS — Z8249 Family history of ischemic heart disease and other diseases of the circulatory system: Secondary | ICD-10-CM | POA: Diagnosis not present

## 2022-11-17 DIAGNOSIS — Z7901 Long term (current) use of anticoagulants: Secondary | ICD-10-CM | POA: Diagnosis not present

## 2022-11-17 DIAGNOSIS — J984 Other disorders of lung: Secondary | ICD-10-CM | POA: Diagnosis not present

## 2022-11-17 DIAGNOSIS — C8319 Mantle cell lymphoma, extranodal and solid organ sites: Secondary | ICD-10-CM | POA: Diagnosis not present

## 2022-11-17 DIAGNOSIS — I700 Atherosclerosis of aorta: Secondary | ICD-10-CM | POA: Diagnosis not present

## 2022-11-17 DIAGNOSIS — Z79899 Other long term (current) drug therapy: Secondary | ICD-10-CM | POA: Diagnosis not present

## 2022-11-17 DIAGNOSIS — Z832 Family history of diseases of the blood and blood-forming organs and certain disorders involving the immune mechanism: Secondary | ICD-10-CM | POA: Diagnosis not present

## 2022-11-17 DIAGNOSIS — N400 Enlarged prostate without lower urinary tract symptoms: Secondary | ICD-10-CM | POA: Diagnosis not present

## 2022-11-17 DIAGNOSIS — E785 Hyperlipidemia, unspecified: Secondary | ICD-10-CM | POA: Diagnosis not present

## 2022-11-17 DIAGNOSIS — Z833 Family history of diabetes mellitus: Secondary | ICD-10-CM | POA: Diagnosis not present

## 2022-11-17 DIAGNOSIS — Z801 Family history of malignant neoplasm of trachea, bronchus and lung: Secondary | ICD-10-CM | POA: Insufficient documentation

## 2022-11-17 LAB — CBC WITH DIFFERENTIAL (CANCER CENTER ONLY)
Abs Immature Granulocytes: 0.03 10*3/uL (ref 0.00–0.07)
Basophils Absolute: 0 10*3/uL (ref 0.0–0.1)
Basophils Relative: 1 %
Eosinophils Absolute: 0 10*3/uL (ref 0.0–0.5)
Eosinophils Relative: 0 %
HCT: 35.5 % — ABNORMAL LOW (ref 39.0–52.0)
Hemoglobin: 11.6 g/dL — ABNORMAL LOW (ref 13.0–17.0)
Immature Granulocytes: 1 %
Lymphocytes Relative: 24 %
Lymphs Abs: 1 10*3/uL (ref 0.7–4.0)
MCH: 29.4 pg (ref 26.0–34.0)
MCHC: 32.7 g/dL (ref 30.0–36.0)
MCV: 89.9 fL (ref 80.0–100.0)
Monocytes Absolute: 0.7 10*3/uL (ref 0.1–1.0)
Monocytes Relative: 17 %
Neutro Abs: 2.3 10*3/uL (ref 1.7–7.7)
Neutrophils Relative %: 57 %
Platelet Count: 233 10*3/uL (ref 150–400)
RBC: 3.95 MIL/uL — ABNORMAL LOW (ref 4.22–5.81)
RDW: 14 % (ref 11.5–15.5)
WBC Count: 4 10*3/uL (ref 4.0–10.5)
nRBC: 0 % (ref 0.0–0.2)

## 2022-11-17 LAB — CMP (CANCER CENTER ONLY)
ALT: 17 U/L (ref 0–44)
AST: 19 U/L (ref 15–41)
Albumin: 4.4 g/dL (ref 3.5–5.0)
Alkaline Phosphatase: 28 U/L — ABNORMAL LOW (ref 38–126)
Anion gap: 5 (ref 5–15)
BUN: 19 mg/dL (ref 8–23)
CO2: 28 mmol/L (ref 22–32)
Calcium: 9.3 mg/dL (ref 8.9–10.3)
Chloride: 106 mmol/L (ref 98–111)
Creatinine: 1.32 mg/dL — ABNORMAL HIGH (ref 0.61–1.24)
GFR, Estimated: 58 mL/min — ABNORMAL LOW (ref >60)
Glucose, Bld: 98 mg/dL (ref 70–99)
Potassium: 4.3 mmol/L (ref 3.5–5.1)
Sodium: 139 mmol/L (ref 135–145)
Total Bilirubin: 1 mg/dL (ref 0.3–1.2)
Total Protein: 6.6 g/dL (ref 6.5–8.1)

## 2022-11-17 LAB — LACTATE DEHYDROGENASE: LDH: 224 U/L — ABNORMAL HIGH (ref 98–192)

## 2022-11-21 ENCOUNTER — Other Ambulatory Visit: Payer: Self-pay

## 2022-11-24 ENCOUNTER — Other Ambulatory Visit (HOSPITAL_COMMUNITY): Payer: Self-pay

## 2022-11-24 NOTE — Progress Notes (Signed)
Specialty Pharmacy Ongoing Clinical Assessment Note  [name] is a 72 y.o. male who is being followed by the specialty pharmacy service for RxSp Oncology   Patient's specialty medication(s) reviewed today: Ibrutinib   Missed doses in the last 4 weeks: 0   Patient/Caregiver did not have any additional questions or concerns.   Therapeutic benefit summary: Patient is achieving benefit   Adverse events/side effects summary: Experienced adverse events/side effects (unusual dreams, tolerable)   Patient's therapy is appropriate to: Continue    Goals Addressed             This Visit's Progress    Achieve or maintain remission       Patient is on track. Patient will maintain adherence. Last PET scan from 05/07/22 showed no clear evidence of relapse, CT scan scheduled in 6 months.          Follow up:  6 months  Servando Snare Specialty Pharmacist

## 2022-11-24 NOTE — Progress Notes (Signed)
Specialty Pharmacy Refill Coordination Note  [name] is a 72 y.o. male contacted today regarding refills of specialty medication(s) Ibrutinib   Patient requested Delivery   Delivery date: 11/28/22   Verified address: [address] [place] [address]   Medication will be filled on 11/27/22.

## 2022-11-25 ENCOUNTER — Other Ambulatory Visit (HOSPITAL_COMMUNITY): Payer: Self-pay | Admitting: Cardiology

## 2022-11-26 ENCOUNTER — Telehealth: Payer: Self-pay | Admitting: Internal Medicine

## 2022-11-26 NOTE — Telephone Encounter (Signed)
Pt c/o medication issue:  1. Name of Medication:   JARDIANCE 10 MG TABS tablet   2. How are you currently taking this medication (dosage and times per day)?   As prescribed  3. Are you having a reaction (difficulty breathing--STAT)?   No  4. What is your medication issue?   Patient stated he has now run out of this medication and will need assistance getting back on the program to get it.

## 2022-11-26 NOTE — Telephone Encounter (Signed)
Spoke with pt and informed him we do not have samples of jardiance available right now but I can provide him with pt assistance form. He asked for form to be mailed to him. Sent out today.

## 2022-12-11 ENCOUNTER — Other Ambulatory Visit: Payer: Self-pay

## 2022-12-19 ENCOUNTER — Other Ambulatory Visit: Payer: Self-pay

## 2022-12-19 DIAGNOSIS — E1110 Type 2 diabetes mellitus with ketoacidosis without coma: Secondary | ICD-10-CM

## 2022-12-22 ENCOUNTER — Telehealth: Payer: Self-pay | Admitting: *Deleted

## 2022-12-22 NOTE — Progress Notes (Unsigned)
  Care Coordination  Outreach Note  12/22/2022 Name: [name] MRN: [id number] DOB: 1950/06/03   Care Coordination Outreach Attempts: An unsuccessful telephone outreach was attempted today to offer the patient information about available care coordination services.  Follow Up Plan:  Additional outreach attempts will be made to offer the patient care coordination information and services.   Encounter Outcome:  Patient Request to Call Back  Burman Nieves, [facility] Care Coordination Care Guide Direct Dial: [phone]

## 2022-12-23 ENCOUNTER — Other Ambulatory Visit: Payer: Self-pay

## 2022-12-23 ENCOUNTER — Other Ambulatory Visit: Payer: Self-pay | Admitting: Internal Medicine

## 2022-12-23 DIAGNOSIS — I480 Paroxysmal atrial fibrillation: Secondary | ICD-10-CM

## 2022-12-23 NOTE — Telephone Encounter (Signed)
Eliquis 5mg  refill request received. Patient is 72 years old, weight-84.7kg, Crea-1.32 on 11/17/22, Diagnosis-Afib/flutter, and last seen by Dorena Cookey on 04/16/22. Dose is appropriate based on dosing criteria. Will send in refill to requested pharmacy.

## 2022-12-23 NOTE — Progress Notes (Signed)
Specialty Pharmacy Refill Coordination Note  [name] is a 72 y.o. male contacted today regarding refills of specialty medication(s) Ibrutinib   Patient requested Delivery   Delivery date: 12/25/22   Verified address: [address] [place] [address]   Medication will be filled on 12/24/22.

## 2022-12-25 NOTE — Progress Notes (Signed)
  Care Coordination   Note   12/25/2022 Name: [name] MRN: [id number] DOB: 1950-08-08  [name] is a 72 y.o. year old male who sees Carmelia Roller, Jilda Roche, DO for primary care. I reached out to [name] by phone today to offer care coordination services.  Mr. Dabdoub was given information about Care Coordination services today including:   The Care Coordination services include support from the care team which includes your Nurse Coordinator, Clinical Social Worker, or Pharmacist.  The Care Coordination team is here to help remove barriers to the health concerns and goals most important to you. Care Coordination services are voluntary, and the patient may decline or stop services at any time by request to their care team member.   Care Coordination Consent Status: Patient agreed to services and verbal consent obtained.   Follow up plan:  Telephone appointment with care coordination team member scheduled for:  01/05/2023  Encounter Outcome:  Patient Scheduled from referral   Burman Nieves, [facility] Care Coordination Care Guide Direct Dial: [phone]

## 2023-01-05 ENCOUNTER — Ambulatory Visit: Payer: Self-pay

## 2023-01-05 ENCOUNTER — Telehealth: Payer: Self-pay | Admitting: Internal Medicine

## 2023-01-05 MED ORDER — SACUBITRIL-VALSARTAN 97-103 MG PO TABS: 1.0000 | ORAL_TABLET | Freq: Two times a day (BID) | ORAL | 0 refills | Status: DC

## 2023-01-05 NOTE — Patient Instructions (Signed)
Visit Information  Thank you for taking time to visit with me today. Please don't hesitate to contact me if I can be of assistance to you.   Following are the goals we discussed today:  Continue to take medications as prescribed. Continue to attend provider visits as scheduled Continue to eat healthy, lean meats, vegetables, fruits, avoid saturated and transfats Contact provider with health questions or concerns as needed  Our next appointment is by telephone on 02/08/22 at 10:00 am  Please call the care guide team at [phone] if you need to cancel or reschedule your appointment.   If you are experiencing a Mental Health or Behavioral Health Crisis or need someone to talk to, please call the Suicide and Crisis Lifeline: 988 call the [place] National Suicide Prevention Lifeline: 505-184-4815 or TTY: 682-329-0820 TTY 416-881-1368) to talk to a trained counselor  Kathyrn Sheriff, RN, MSN, BSN, CCM Care Management Coordinator [phone]

## 2023-01-05 NOTE — Patient Outreach (Signed)
  Care Coordination   Initial Visit Note   01/05/2023 Name: [name] MRN: [id number] DOB: February 09, 1950  [name] is a 72 y.o. year old male who sees Carmelia Roller, Jilda Roche, DO for primary care. I spoke with  [name] by phone today.  What matters to the patients health and wellness today?  Mr. Mcinerny reports he is doing well. He states history of cancer which he report is in remission. He states he is active and works out 5 days/week. He reports he eats healthy and has a good appetite. He denies any specific questions or concerns at this time. He is receptive to educational material to assist with management of health.   Goals Addressed             This Visit's Progress    health management-education       Interventions Today    Flowsheet Row Most Recent Value  Chronic Disease   Chronic disease during today's visit Other  General Interventions   General Interventions Discussed/Reviewed General Interventions Discussed, Doctor Visits  [Evaluation of current treatment plan for health condition and patient's adherence to plan.]  Doctor Visits Discussed/Reviewed Doctor Visits Discussed, PCP  PCP/Specialist Visits Compliance with follow-up visit  Exercise Interventions   Exercise Discussed/Reviewed Exercise Discussed  [assessed level of activity]  Education Interventions   Education Provided Provided Education  [provided emmi education: high blood pressure in adults,  cholesterol test,  can foods or supplements lower cholesterol]  Provided Verbal Education On When to see the doctor, Nutrition, Exercise, Medication  [advised to take medications as prescribed,  attend provider visits as recommended,  contact provider with health questions or concerns as needed]  Nutrition Interventions   Nutrition Discussed/Reviewed Nutrition Discussed  Pharmacy Interventions   Pharmacy Dicussed/Reviewed Pharmacy Topics Discussed  [medications reviewed,  advised to contact RNCM and/or  provider if any difficulty obtaining any medications]            SDOH assessments and interventions completed:  Yes  SDOH Interventions Today    Flowsheet Row Most Recent Value  SDOH Interventions   Food Insecurity Interventions Intervention Not Indicated  Housing Interventions Intervention Not Indicated  Transportation Interventions Intervention Not Indicated  Utilities Interventions Intervention Not Indicated     Care Coordination Interventions:  No, not indicated   Follow up plan: Follow up call scheduled for 02/08/22    Encounter Outcome:  Patient Visit Completed   Kathyrn Sheriff, RN, MSN, BSN, CCM Care Management Coordinator [phone]

## 2023-01-05 NOTE — Telephone Encounter (Signed)
Rx sent to pharmacy   

## 2023-01-05 NOTE — Addendum Note (Signed)
Addended by: Daphane Shepherd on: 01/05/2023 03:58 PM   Modules accepted: Orders

## 2023-01-05 NOTE — Telephone Encounter (Signed)
*  STAT* If patient is at the pharmacy, call can be transferred to refill team.   1. Which medications need to be refilled? (please list name of each medication and dose if known) sacubitril-valsartan (ENTRESTO) 97-103 MG [[id number]]    2. Would you like to learn more about the convenience, safety, & potential cost savings by using the [facility]? NA    3. Are you open to using the [facility] (Type [facility]. Na .   4. Which pharmacy/location (including street and city if local pharmacy) is medication to be sent to? [address]    5. Do they need a 30 day or 90 day supply? 90

## 2023-01-06 ENCOUNTER — Telehealth: Payer: Self-pay | Admitting: Internal Medicine

## 2023-01-06 MED ORDER — SACUBITRIL-VALSARTAN 97-103 MG PO TABS: 1.0000 | ORAL_TABLET | Freq: Two times a day (BID) | ORAL | 1 refills | Status: DC

## 2023-01-06 NOTE — Telephone Encounter (Signed)
Patient's Entresto prescription was sent to pharmacy. Previous prescription was set to print. Patient notified prescription sent.

## 2023-01-06 NOTE — Telephone Encounter (Signed)
  Pt c/o medication issue:  1. Name of Medication:   sacubitril-valsartan (ENTRESTO) 97-103 MG    2. How are you currently taking this medication (dosage and times per day)?   Take 1 tablet by mouth 2 (two) times daily.    3. Are you having a reaction (difficulty breathing--STAT)? No   4. What is your medication issue? The patient requested a refill for his Entresto yesterday to [facility]. He went there today and was told they did not receive the prescription. Additionally, the patient is enrolled in patient assistance through [employer]. When he called [employer], he was told he is no longer eligible for a refill, but his patient assistance is valid until 02/03/23. He mentioned that he and his wife are working to re-enroll with [employer], and he is almost out of medication. He also said he is willing to pay out of pocket as long as it is not too expensive so that he can continue taking his medication.

## 2023-01-12 ENCOUNTER — Other Ambulatory Visit: Payer: Self-pay

## 2023-01-12 NOTE — Progress Notes (Signed)
Specialty Pharmacy Refill Coordination Note  [name] is a 72 y.o. male contacted today regarding refills of specialty medication(s) Ibrutinib   Patient requested Delivery   Delivery date: 01/19/23   Verified address: [address] [place] [address]   Medication will be filled on 01/16/23.

## 2023-01-14 ENCOUNTER — Telehealth: Payer: Self-pay | Admitting: Internal Medicine

## 2023-01-14 NOTE — Telephone Encounter (Signed)
Paper Work Dropped Off: Patient Assistance forms for Sherryll Burger and London Pepper  Date: 01/14/2023  Location of paper:  Provider Mailbox

## 2023-01-15 NOTE — Telephone Encounter (Signed)
Faxed completed PAP for Entresto and Jardiance on 01/15/23

## 2023-01-15 NOTE — Telephone Encounter (Signed)
MD has completed his portion of each PAP. Patient needs to sign his portion of Entresto PAP. Notified wife -- he will stop by today.

## 2023-01-20 NOTE — Telephone Encounter (Signed)
Patient approved for Entresto patient assistance until 02/03/24

## 2023-02-05 ENCOUNTER — Other Ambulatory Visit (HOSPITAL_COMMUNITY): Payer: Self-pay | Admitting: Pharmacy Technician

## 2023-02-05 ENCOUNTER — Telehealth: Payer: Self-pay | Admitting: Pharmacy Technician

## 2023-02-05 ENCOUNTER — Encounter: Payer: Self-pay | Admitting: Oncology

## 2023-02-05 ENCOUNTER — Other Ambulatory Visit (HOSPITAL_COMMUNITY): Payer: Self-pay

## 2023-02-05 NOTE — Telephone Encounter (Signed)
 Oral Oncology Patient Advocate Encounter  Was successful in securing patient a $8,500 grant from [facility] to provide copayment coverage for Imbruvica .  This will keep the out of pocket expense at $0.     [employer] ID: [id number]   The billing information is as follows and has been shared with [facility].    RxBin: [id number] PCN: [id number] Member ID: [id number] Group ID: [id number] Dates of Eligibility: 01/06/23 through 01/05/24  Fund:  Mantle Cell  [name], CPhT-Adv Oncology Pharmacy Patient Advocate [facility] Direct Number: [phone]  Fax: [phone]

## 2023-02-05 NOTE — Progress Notes (Signed)
 Specialty Pharmacy Refill Coordination Note  [name] is a 73 y.o. male contacted today regarding refills of specialty medication(s) Ibrutinib  (IMBRUVICA )   Patient requested Delivery   Delivery date: 02/12/23   Verified address: [address] [place]   Medication will be filled on 02/11/23.

## 2023-02-08 ENCOUNTER — Other Ambulatory Visit (HOSPITAL_COMMUNITY): Payer: Self-pay | Admitting: Cardiology

## 2023-02-08 NOTE — Progress Notes (Signed)
 [facility] [phone]   [phone]  PROGRESS NOTE  Patient Care Team: Frann Mabel Mt, DO as PCP - General (Family Medicine) Mona Vinie BROCKS, MD as PCP - Cardiology (Cardiology) Cindie Ole DASEN, MD as PCP - Electrophysiology (Cardiology) Belinda Cough, MD as Consulting Physician (General Surgery) Dianna Specking, MD as Consulting Physician (Gastroenterology) Debarah Elsie LABOR, MD as Referring Physician (Internal Medicine) Claudean Laneta Mclean, MD as Referring Physician (Hematology and Oncology) Army Dallas NOVAK, MD (Inactive) as Consulting Physician (Cardiothoracic Surgery) Federico Norleen DASEN MADISON, MD as Consulting Physician (Hematology and Oncology) Wallace, Juana M, RN as [facility] Care Management  Hematological/Oncological History # Marginal Zone Lymphoma, In Remission  (a) s/p coil embolization of the feeding (gastro-duodenal) artery 01/23/2015             (b) anemia--scant iron stores on bone marrow biopsy--s/p feraheme  02/13/2015   (1) right inguinal lymph node biopsy 01/22/2015 confirms mantle cell lymphoma             (a) bone marrow biopsy 02/06/2015 positive for involvement by the patient's mantle cell lymphoma             (b) IPI score of 5 (high risk) predicts a 5 year progression free survival of 50% with CHOP-Rituxan  chemotherapy             (c) MIPI score of 5 (intermediate risk) predicts a median survival of 58 months             (d) the cells were CD5 positive, CD19 and 20+, CD23 negative, lambda restricted   (2) CHOP/Rituxan  started 02/09/2015, completed 8 cycles 07/05/2015; total doxorubicin  dose 300 mg/M2   (3)  [facility] consultation 04/06/2015. Patient opted against transplant consolidation   (4) rituximab  maintenance started 08/16/2015, repeat every 2 months, discontinued February 2019             (a) PET scan had shown a suggestion of recurrence, but see (6) below   (5) PSA increase noted July 2017, back to baseline by  December 2017             (a) also noted to be high (13.0) October 2021, resolved with antibiotics   (6) restaging PET scan 11/17/2016 is consistent with disease progression             (a) repeat PET scan 02/02/2017 confirms increase in lymph nodes             (b) PET scan 04/20/2017 shows increasing hypermetabolic adenopathy             (c) bronchoscopy/mediastinoscopy 05/05/2017 shows granulomatous disease, noncaseating, with negative AFB/ GMS stains; <10% of lymphocytes are B-cell with slight lambda excess             (d) PET scan on 07/06/2017 shows mild increase in mediastinal lymphadenopathy. Increased hypermetabolic supraclavicular lymph node.             (e) PET scan 12/24/2017 interpreted as showing evidence of partial response                    (f) PET 09/21/2018 shows decreased size and metabolism of scattered measurable nodes; Deauville 4             (g) PET scan 11/29/2019 essentially unchanged except for increased prostate activity (see #10 below).             (h) thoracic surgery (Dr. Carleton Gaskins) feels it would be very difficult to obtain a  biopsy of the small positive areas in the most recent scan             (i) PET scan 11/13/2020 shows decreased activity in the previously noted subcentimeter Deauville-4 mediastinal and hilar adenopathy              (7) ibrutinib  420 mg daily started 02/09/2017, discontinued March 2019             (a) resumed April 2019 at 560 mg daily             (b) dose back to 420 mg daily as of May 2020  04/09/2021: Transition care to Dr. Federico due to Dr. Tauna retirement.  Patient continuing on ibrutinib  420 mg p.o. daily  Interval History:  [name] 73 y.o. male with medical history significant for mantle cell lymphoma in remission who presents for a follow up visit. The patient's last visit was on 11/17/2022. In the interim since the last visit he has had no major changes in his health.  On exam today [name] reports in regards to the  holiday season as he is glad it is over.  He reports he has been hosting a lot of events and his wife is a public house manager.  He notes that overall he has been well in the interim since her last visit.  He is had no hospitalizations or ER visits.  He reports he continues working out about 4 days/week.  His energy levels are good and his exercise endurance is strong.  He reports he is tolerating his ibrutinib  well with no missed dosages.  He reports he is taking it faithfully daily.  He is had no trouble with the medications including no palpitations or bleeding, bruising, or dark stools.  He reports his weight has been steady.. Overall he feels well and is willing and able to continue on ibrutinib  therapy at this time.  He denies any fevers, chills, sweats, nausea, vomiting or diarrhea.  A full 10 point ROS is listed below.  MEDICAL HISTORY:  Past Medical History:  Diagnosis Date   Anemia    Cancer (HCC)    Lymphoma   Essential hypertension    GERD (gastroesophageal reflux disease)    GI bleed    HLD (hyperlipidemia)    Weakness     SURGICAL HISTORY: Past Surgical History:  Procedure Laterality Date   ESOPHAGOGASTRODUODENOSCOPY Left 01/18/2015   Procedure: ESOPHAGOGASTRODUODENOSCOPY (EGD);  Surgeon: Jerrell Sol, MD;  Location: [facility] ENDOSCOPY;  Service: Endoscopy;  Laterality: Left;   INGUINAL HERNIA REPAIR Right 01/22/2015   Procedure: RIGHT INGUINAL LYMPH NODE BX;  Surgeon: Donnice Lima, MD;  Location: [facility] OR;  Service: General;  Laterality: Right;   IR REMOVAL TUN ACCESS W/ PORT W/O FL MOD SED  01/24/2022   MEDIASTINOSCOPY N/A 05/05/2017   Procedure: MEDIASTINOSCOPY;  Surgeon: Army Dallas NOVAK, MD;  Location: [facility] OR;  Service: Thoracic;  Laterality: N/A;   PORTACATH PLACEMENT  01/26/2015    power port with tip SVC/RA [place]   SKIN SURGERY     Small benign cysts over left scalp removed   VIDEO BRONCHOSCOPY WITH ENDOBRONCHIAL ULTRASOUND N/A 05/05/2017   Procedure: VIDEO BRONCHOSCOPY  WITH ENDOBRONCHIAL ULTRASOUND;  Surgeon: Army Dallas NOVAK, MD;  Location: [facility] OR;  Service: Thoracic;  Laterality: N/A;    SOCIAL HISTORY: Social History   Socioeconomic History   Marital status: Married    Spouse name: Not on file   Number of children: Not on file  Years of education: Not on file   Highest education level: Bachelor's degree (e.g., BA, AB, BS)  Occupational History   Occupation: Retired  Tobacco Use   Smoking status: Never   Smokeless tobacco: Never  Vaping Use   Vaping status: Never Used  Substance and Sexual Activity   Alcohol use: Never    Alcohol/week: 6.0 standard drinks of alcohol    Types: 3 Cans of beer, 3 Shots of liquor per week   Drug use: No   Sexual activity: Yes    Partners: Female  Other Topics Concern   Not on file  Social History Narrative   Not on file   Social Drivers of Health   Financial Resource Strain: Low Risk  (05/23/2022)   Overall Financial Resource Strain (CARDIA)    Difficulty of Paying Living Expenses: Not hard at all  Food Insecurity: No Food Insecurity (01/05/2023)   Hunger Vital Sign    Worried About Running Out of Food in the Last Year: Never true    Ran Out of Food in the Last Year: Never true  Transportation Needs: No Transportation Needs (01/05/2023)   PRAPARE - Administrator, Civil Service (Medical): No    Lack of Transportation (Non-Medical): No  Physical Activity: Sufficiently Active (05/23/2022)   Exercise Vital Sign    Days of Exercise per Week: 5 days    Minutes of Exercise per Session: 30 min  Stress: No Stress Concern Present (04/02/2021)   [employer] of Occupational Health - Occupational Stress Questionnaire    Feeling of Stress : Not at all  Social Connections: Moderately Isolated (05/23/2022)   Social Connection and Isolation Panel [NHANES]    Frequency of Communication with Friends and Family: More than three times a week    Frequency of Social Gatherings with Friends and Family:  Once a week    Attends Religious Services: Never    Database Administrator or Organizations: No    Attends Engineer, Structural: Not on file    Marital Status: Married  Catering Manager Violence: Not At Risk (01/05/2023)   Humiliation, Afraid, Rape, and Kick questionnaire    Fear of Current or Ex-Partner: No    Emotionally Abused: No    Physically Abused: No    Sexually Abused: No    FAMILY HISTORY: Family History  Problem Relation Age of Onset   Hypertension Mother    Hypertension Father    Kidney failure Father    Hypertension Sister    Diabetes Sister    Lupus Sister    Prostate cancer Brother    Lung cancer Brother    Colon cancer Neg Hx    Stomach cancer Neg Hx     ALLERGIES:  has no known allergies.  MEDICATIONS:  Current Outpatient Medications  Medication Sig Dispense Refill   atorvastatin  (LIPITOR ) 80 MG tablet Take 1 tablet (80 mg total) by mouth daily. 90 tablet 3   carvedilol  (COREG ) 12.5 MG tablet Take 1 tablet (12.5 mg total) by mouth 2 (two) times daily with a meal. 180 tablet 3   ELIQUIS  5 MG TABS tablet TAKE 1 TABLET BY MOUTH TWICE A DAY 180 tablet 1   folic acid  (FOLVITE ) 1 MG tablet Take 1 tablet (1 mg total) by mouth daily. 100 tablet 3   ibrutinib  (IMBRUVICA ) 420 MG tablet Take 1 tablet (420 mg) by mouth daily. 28 tablet 6   JARDIANCE  10 MG TABS tablet TAKE 1 TABLET BY MOUTH DAILY BEFORE BREAKFAST.  30 tablet 11   sacubitril -valsartan  (ENTRESTO ) 97-103 MG Take 1 tablet by mouth 2 (two) times daily. 180 tablet 1   spironolactone  (ALDACTONE ) 25 MG tablet Take 1 tablet (25 mg total) by mouth daily. NEEDS FOLLOW UP APPOINTMENT FOR MORE REFILLS 90 tablet 0   No current facility-administered medications for this visit.    REVIEW OF SYSTEMS:   Constitutional: ( - ) fevers, ( - )  chills , ( - ) night sweats Eyes: ( - ) blurriness of vision, ( - ) double vision, ( - ) watery eyes Ears, nose, mouth, throat, and face: ( - ) mucositis, ( - ) sore  throat Respiratory: ( - ) cough, ( - ) dyspnea, ( - ) wheezes Cardiovascular: ( - ) palpitation, ( - ) chest discomfort, ( - ) lower extremity swelling Gastrointestinal:  ( - ) nausea, ( - ) heartburn, ( - ) change in bowel habits Skin: ( - ) abnormal skin rashes Lymphatics: ( - ) new lymphadenopathy, ( - ) easy bruising Neurological: ( - ) numbness, ( - ) tingling, ( - ) new weaknesses Behavioral/Psych: ( - ) mood change, ( - ) new changes  All other systems were reviewed with the patient and are negative.  PHYSICAL EXAMINATION:  There were no vitals filed for this visit.  There were no vitals filed for this visit.   GENERAL: Well-appearing middle-aged African-American male, alert, no distress and comfortable SKIN: skin color, texture, turgor are normal, no rashes or significant lesions EYES: conjunctiva are pink and non-injected, sclera clear LUNGS: clear to auscultation and percussion with normal breathing effort HEART: regular rate & rhythm and no murmurs and no lower extremity edema Musculoskeletal: no cyanosis of digits and no clubbing. Large mobile, soft mass on mid back most consistent with lipoma.  PSYCH: alert & oriented x 3, fluent speech NEURO: no focal motor/sensory deficits  LABORATORY DATA:  I have reviewed the data as listed    Latest Ref Rng & Units 11/17/2022    9:21 AM 07/21/2022    7:37 AM 04/21/2022    7:31 AM  CBC  WBC 4.0 - 10.5 K/uL 4.0  3.2  3.2   Hemoglobin 13.0 - 17.0 g/dL 88.3  87.5  87.7   Hematocrit 39.0 - 52.0 % 35.5  38.2  38.3   Platelets 150 - 400 K/uL 233  236  252        Latest Ref Rng & Units 11/17/2022    9:21 AM 07/21/2022    7:37 AM 04/21/2022    7:31 AM  CMP  Glucose 70 - 99 mg/dL 98  888  87   BUN 8 - 23 mg/dL 19  19  22    Creatinine 0.61 - 1.24 mg/dL 8.67  8.62  8.52   Sodium 135 - 145 mmol/L 139  140  141   Potassium 3.5 - 5.1 mmol/L 4.3  4.4  4.4   Chloride 98 - 111 mmol/L 106  105  108   CO2 22 - 32 mmol/L 28  28  27     Calcium  8.9 - 10.3 mg/dL 9.3  9.2  8.8   Total Protein 6.5 - 8.1 g/dL 6.6  6.2  6.7   Total Bilirubin 0.3 - 1.2 mg/dL 1.0  0.7  0.6   Alkaline Phos 38 - 126 U/L 28  32  33   AST 15 - 41 U/L 19  21  19    ALT 0 - 44 U/L 17  20  17  RADIOGRAPHIC STUDIES: No results found.  ASSESSMENT & PLAN [name] 73 y.o. male with medical history significant for mantle cell lymphoma in remission who presents for a follow up visit.  #Mantle Cell Lymphoma in Remission -- Prior PET CT scan in October 2022 showed no evidence of residual or recurrent disease, though there were some lymph nodes concerning for sarcoidosis --Patient has been remission since at least 06/11/2015 at which time his PET CT scan showed no evidence of recurrent or residual disease. --PET CT on 11/13/2022,  no clear evidence of relapsed/recurrent disease. Recommend standard CT scan q 6 months. CT scan due April 2025 --Labs from today were reviewed and require no intervention. Labs show white blood cell 4.1, Hgb 12.9, MCV 87.9, Plt 222 --Continue ibrutinib  420 mg p.o. daily --Return to clinic 3 months with labs.   No orders of the defined types were placed in this encounter.   All questions were answered. The patient knows to call the clinic with any problems, questions or concerns.  I have spent a total of 30 minutes minutes of face-to-face and non-face-to-face time, preparing to see the patient performing a medically appropriate examination, counseling and educating the patient, ordering tests, documenting clinical information in the electronic health record, and care coordination.   Norleen IVAR Kidney, MD Department of Hematology/Oncology [facility] Phone: [phone] Pager: [phone] Email: [email or web address]  02/08/2023 12:50 PM

## 2023-02-09 ENCOUNTER — Inpatient Hospital Stay: Payer: Medicare PPO | Attending: Hematology and Oncology

## 2023-02-09 ENCOUNTER — Inpatient Hospital Stay: Payer: Medicare PPO | Admitting: Hematology and Oncology

## 2023-02-09 ENCOUNTER — Ambulatory Visit: Payer: Self-pay

## 2023-02-09 VITALS — BP 147/74 | HR 65 | Temp 98.0°F | Resp 13 | Wt 187.5 lb

## 2023-02-09 DIAGNOSIS — C8319 Mantle cell lymphoma, extranodal and solid organ sites: Secondary | ICD-10-CM

## 2023-02-09 DIAGNOSIS — Z08 Encounter for follow-up examination after completed treatment for malignant neoplasm: Secondary | ICD-10-CM | POA: Insufficient documentation

## 2023-02-09 DIAGNOSIS — I6329 Cerebral infarction due to unspecified occlusion or stenosis of other precerebral arteries: Secondary | ICD-10-CM | POA: Diagnosis not present

## 2023-02-09 DIAGNOSIS — Z8572 Personal history of non-Hodgkin lymphomas: Secondary | ICD-10-CM | POA: Insufficient documentation

## 2023-02-09 LAB — CBC WITH DIFFERENTIAL (CANCER CENTER ONLY)
Abs Immature Granulocytes: 0 10*3/uL (ref 0.00–0.07)
Basophils Absolute: 0 10*3/uL (ref 0.0–0.1)
Basophils Relative: 1 %
Eosinophils Absolute: 0 10*3/uL (ref 0.0–0.5)
Eosinophils Relative: 1 %
HCT: 39.8 % (ref 39.0–52.0)
Hemoglobin: 12.9 g/dL — ABNORMAL LOW (ref 13.0–17.0)
Immature Granulocytes: 0 %
Lymphocytes Relative: 25 %
Lymphs Abs: 1.1 10*3/uL (ref 0.7–4.0)
MCH: 28.5 pg (ref 26.0–34.0)
MCHC: 32.4 g/dL (ref 30.0–36.0)
MCV: 87.9 fL (ref 80.0–100.0)
Monocytes Absolute: 0.6 10*3/uL (ref 0.1–1.0)
Monocytes Relative: 16 %
Neutro Abs: 2.4 10*3/uL (ref 1.7–7.7)
Neutrophils Relative %: 57 %
Platelet Count: 222 10*3/uL (ref 150–400)
RBC: 4.53 MIL/uL (ref 4.22–5.81)
RDW: 13.6 % (ref 11.5–15.5)
WBC Count: 4.1 10*3/uL (ref 4.0–10.5)
nRBC: 0 % (ref 0.0–0.2)

## 2023-02-09 LAB — CMP (CANCER CENTER ONLY)
ALT: 17 U/L (ref 0–44)
AST: 17 U/L (ref 15–41)
Albumin: 4.4 g/dL (ref 3.5–5.0)
Alkaline Phosphatase: 28 U/L — ABNORMAL LOW (ref 38–126)
Anion gap: 5 (ref 5–15)
BUN: 19 mg/dL (ref 8–23)
CO2: 29 mmol/L (ref 22–32)
Calcium: 9.3 mg/dL (ref 8.9–10.3)
Chloride: 109 mmol/L (ref 98–111)
Creatinine: 1.44 mg/dL — ABNORMAL HIGH (ref 0.61–1.24)
GFR, Estimated: 52 mL/min — ABNORMAL LOW (ref >60)
Glucose, Bld: 79 mg/dL (ref 70–99)
Potassium: 4.5 mmol/L (ref 3.5–5.1)
Sodium: 143 mmol/L (ref 135–145)
Total Bilirubin: 0.7 mg/dL (ref 0.0–1.2)
Total Protein: 6.6 g/dL (ref 6.5–8.1)

## 2023-02-09 LAB — LACTATE DEHYDROGENASE: LDH: 207 U/L — ABNORMAL HIGH (ref 98–192)

## 2023-02-09 NOTE — Patient Outreach (Signed)
  Care Coordination   Follow Up Visit Note   02/09/2023 Name: [name] MRN: [id number] DOB: 03-16-1950  [name] is a 73 y.o. year old male who sees [name], Gary Mt, DO for primary care. I spoke with  [name] by phone today.  What matters to the patients health and wellness today?  [name] reports he is doing well. He reports an office visit with oncologist after working out this morning that went well. He reports he has all his medications; continues to exercise regularly, and states appetite is very good. He denies any  questions, concerns or educational needs. No care management needs identified at this time. [name] states he will call RNCM if care management needs in the future.   Goals Addressed             This Visit's Progress    COMPLETED: health management-education       Interventions Today    Flowsheet Row Most Recent Value  Chronic Disease   Chronic disease during today's visit Hypertension (HTN), Other  [lymphoma]  General Interventions   General Interventions Discussed/Reviewed General Interventions Reviewed, Doctor Visits  [Evaluation of current treatment plan for health condition and patient's adherence to plan. reviewed care managent program and encouraged patient to contact RNCM or PCP if care management needs in the future.]  Doctor Visits Discussed/Reviewed Doctor Visits Discussed  [name] provider visit with oncologist with lab completed today.]  Exercise Interventions   Exercise Discussed/Reviewed Physical Activity, Exercise Reviewed  Physical Activity Discussed/Reviewed Physical Activity Discussed  [discussed activity level patient reports he continues his regular work out schedule 5 days/week]  Nutrition Interventions   Nutrition Discussed/Reviewed Nutrition Reviewed  [assessed appetite and if any nutritional needs]  Pharmacy Interventions   Pharmacy Dicussed/Reviewed Pharmacy Topics Reviewed  [name] if any medications  assistance needs]            SDOH assessments and interventions completed:  No  Care Coordination Interventions:  Yes, provided   Follow up plan: No further intervention required.   Encounter Outcome:  Patient Visit Completed   Gary Shutter, RN, MSN, BSN, CCM Care Management Coordinator [phone]

## 2023-02-09 NOTE — Patient Instructions (Signed)
 Visit Information  Thank you for taking time to visit with me today. Please don't hesitate to contact me if I can be of assistance to you.   Following are the goals we discussed today:  Continue to take medications as prescribed. Continue to attend provider visits as scheduled Continue to eat healthy, lean meats, vegetables, fruits, avoid saturated and transfats Contact provider with health questions or concerns as needed  Please call the care guide team at [phone] if you need to cancel or reschedule your appointment.   If you are experiencing a Mental Health or Behavioral Health Crisis or need someone to talk to, please call the Suicide and Crisis Lifeline: 988 call the [place]  National Suicide Prevention Lifeline: (231)103-2687 or TTY: 8787665881 TTY 726-518-5786) to talk to a trained counselor   Heddy Shutter, RN, MSN, BSN, CCM Care Management Coordinator [phone]

## 2023-02-11 ENCOUNTER — Other Ambulatory Visit: Payer: Self-pay

## 2023-02-23 ENCOUNTER — Other Ambulatory Visit (HOSPITAL_COMMUNITY): Payer: Self-pay | Admitting: Cardiology

## 2023-03-03 ENCOUNTER — Other Ambulatory Visit (HOSPITAL_COMMUNITY): Payer: Self-pay

## 2023-03-03 ENCOUNTER — Other Ambulatory Visit (HOSPITAL_COMMUNITY): Payer: Self-pay | Admitting: Pharmacy Technician

## 2023-03-03 NOTE — Progress Notes (Signed)
Specialty Pharmacy Refill Coordination Note  [name] is a 73 y.o. male contacted today regarding refills of specialty medication(s) Ibrutinib [name])   Patient requested Delivery   Delivery date: 03/11/23   Verified address: Patient address [address]  [place]   Medication will be filled on 03/10/23.

## 2023-03-30 ENCOUNTER — Other Ambulatory Visit: Payer: Self-pay | Admitting: Pharmacy Technician

## 2023-03-30 ENCOUNTER — Other Ambulatory Visit: Payer: Self-pay

## 2023-03-30 NOTE — Progress Notes (Signed)
 Specialty Pharmacy Refill Coordination Note  [name] is a 73 y.o. male contacted today regarding refills of specialty medication(s) Ibrutinib [name])   Patient requested Delivery   Delivery date: 04/02/23   Verified address: [address] [name]   Medication will be filled on 04/01/23.

## 2023-04-01 ENCOUNTER — Other Ambulatory Visit: Payer: Self-pay

## 2023-04-14 DIAGNOSIS — H21263 Iris atrophy (essential) (progressive), bilateral: Secondary | ICD-10-CM | POA: Diagnosis not present

## 2023-04-14 DIAGNOSIS — H5203 Hypermetropia, bilateral: Secondary | ICD-10-CM | POA: Diagnosis not present

## 2023-04-14 DIAGNOSIS — H2513 Age-related nuclear cataract, bilateral: Secondary | ICD-10-CM | POA: Diagnosis not present

## 2023-04-14 DIAGNOSIS — H40023 Open angle with borderline findings, high risk, bilateral: Secondary | ICD-10-CM | POA: Diagnosis not present

## 2023-04-14 DIAGNOSIS — H524 Presbyopia: Secondary | ICD-10-CM | POA: Diagnosis not present

## 2023-04-20 ENCOUNTER — Other Ambulatory Visit: Payer: Self-pay

## 2023-04-20 ENCOUNTER — Other Ambulatory Visit: Payer: Self-pay | Admitting: Hematology and Oncology

## 2023-04-20 DIAGNOSIS — I10 Essential (primary) hypertension: Secondary | ICD-10-CM | POA: Diagnosis not present

## 2023-04-20 DIAGNOSIS — C8119 Nodular sclerosis classical Hodgkin lymphoma, extranodal and solid organ sites: Secondary | ICD-10-CM | POA: Diagnosis not present

## 2023-04-20 DIAGNOSIS — E785 Hyperlipidemia, unspecified: Secondary | ICD-10-CM | POA: Diagnosis not present

## 2023-04-20 DIAGNOSIS — E663 Overweight: Secondary | ICD-10-CM | POA: Diagnosis not present

## 2023-04-20 DIAGNOSIS — Z0000 Encounter for general adult medical examination without abnormal findings: Secondary | ICD-10-CM | POA: Diagnosis not present

## 2023-04-20 DIAGNOSIS — I779 Disorder of arteries and arterioles, unspecified: Secondary | ICD-10-CM | POA: Diagnosis not present

## 2023-04-20 DIAGNOSIS — Z8673 Personal history of transient ischemic attack (TIA), and cerebral infarction without residual deficits: Secondary | ICD-10-CM | POA: Diagnosis not present

## 2023-04-20 DIAGNOSIS — Z6826 Body mass index (BMI) 26.0-26.9, adult: Secondary | ICD-10-CM | POA: Diagnosis not present

## 2023-04-20 DIAGNOSIS — R011 Cardiac murmur, unspecified: Secondary | ICD-10-CM | POA: Diagnosis not present

## 2023-04-20 MED ORDER — IBRUTINIB 420 MG PO TABS
420.0000 mg | ORAL_TABLET | Freq: Every day | ORAL | 6 refills | Status: DC
  Filled 2023-04-21: qty 28, 28d supply, fill #0
  Filled 2023-05-19: qty 28, 28d supply, fill #1
  Filled 2023-06-11 (×2): qty 28, 28d supply, fill #2
  Filled 2023-07-17: qty 28, 28d supply, fill #3
  Filled 2023-08-13: qty 28, 28d supply, fill #4
  Filled 2023-09-10: qty 28, 28d supply, fill #5
  Filled 2023-10-06 (×2): qty 28, 28d supply, fill #6

## 2023-04-20 NOTE — Progress Notes (Signed)
 Specialty Pharmacy Refill Coordination Note  DAMYN WEITZEL is a 73 y.o. male contacted today regarding refills of specialty medication(s) Ibrutinib (IMBRUVICA)   Patient requested (Patient-Rptd) Delivery   Delivery date:  (patient selected invalid delivery date. updated delivery date is 3.21. pt will be notified via mychart)   Verified address: (Patient-Rptd) [address] [place] [address]   Medication will be filled on 03.20.25.   This fill date is pending response to refill request from provider. Patient is aware and if they have not received fill by intended date they must follow up with pharmacy.

## 2023-04-21 ENCOUNTER — Other Ambulatory Visit: Payer: Self-pay

## 2023-04-21 ENCOUNTER — Other Ambulatory Visit (HOSPITAL_COMMUNITY): Payer: Self-pay

## 2023-04-27 ENCOUNTER — Other Ambulatory Visit (HOSPITAL_COMMUNITY): Payer: Self-pay | Admitting: Cardiology

## 2023-04-27 ENCOUNTER — Other Ambulatory Visit: Payer: Self-pay | Admitting: Hematology and Oncology

## 2023-04-27 ENCOUNTER — Telehealth: Payer: Self-pay | Admitting: *Deleted

## 2023-04-27 MED ORDER — DIAZEPAM 2 MG PO TABS: 2.0000 mg | ORAL_TABLET | Freq: Once | ORAL | 0 refills | Status: DC

## 2023-04-27 NOTE — Telephone Encounter (Signed)
 Received call from pt's wife, [name], requesting Diazepam 2 mg tablet . Pt takes this 1 hour prior to CT scan on 3/04/04/23. This was done per Dr. Leonides Schanz. Wife aware.

## 2023-05-01 ENCOUNTER — Encounter: Payer: Self-pay | Admitting: Oncology

## 2023-05-04 ENCOUNTER — Ambulatory Visit (HOSPITAL_COMMUNITY)
Admission: RE | Admit: 2023-05-04 | Discharge: 2023-05-04 | Disposition: A | Discharge status: Home / Self Care | Payer: Medicare PPO | Source: Ambulatory Visit | Attending: Hematology and Oncology | Admitting: Hematology and Oncology

## 2023-05-04 DIAGNOSIS — I700 Atherosclerosis of aorta: Secondary | ICD-10-CM | POA: Diagnosis not present

## 2023-05-04 DIAGNOSIS — R911 Solitary pulmonary nodule: Secondary | ICD-10-CM | POA: Diagnosis not present

## 2023-05-04 DIAGNOSIS — C8319 Mantle cell lymphoma, extranodal and solid organ sites: Secondary | ICD-10-CM | POA: Insufficient documentation

## 2023-05-04 DIAGNOSIS — C8310 Mantle cell lymphoma, unspecified site: Secondary | ICD-10-CM | POA: Diagnosis not present

## 2023-05-04 DIAGNOSIS — N400 Enlarged prostate without lower urinary tract symptoms: Secondary | ICD-10-CM | POA: Diagnosis not present

## 2023-05-04 MED ORDER — SODIUM CHLORIDE (PF) 0.9 % IJ SOLN
INTRAMUSCULAR | Status: AC
Start: 2023-05-04 — End: ?
  Filled 2023-05-04: qty 50

## 2023-05-04 MED ORDER — IOHEXOL 300 MG/ML  SOLN
100.0000 mL | Freq: Once | INTRAMUSCULAR | Status: AC | PRN
  Administered 2023-05-04: 100 mL via INTRAVENOUS

## 2023-05-04 MED ORDER — IOHEXOL 300 MG/ML  SOLN
30.0000 mL | Freq: Once | INTRAMUSCULAR | Status: AC | PRN
  Administered 2023-05-04: 30 mL via ORAL

## 2023-05-07 ENCOUNTER — Telehealth: Payer: Self-pay | Admitting: *Deleted

## 2023-05-07 NOTE — Telephone Encounter (Signed)
 TCT patient regarding recent lab results. Spoke to him. 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 We will continue on his current treatment and plan to see him at our clinic later this month. Pt voiced understanding. No further questions or concerns. He is aware of his upcoming appts.

## 2023-05-07 NOTE — Telephone Encounter (Signed)
-----   Message from Ulysees Barns IV sent at 05/06/2023  9:28 AM EDT ----- Please let Mr. 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  We will continue on his current treatment and plan to see him at our clinic later this month. ----- Message ----- From: Interface, Rad Results In Sent: 05/04/2023   4:16 PM EDT To: Jaci Standard, MD

## 2023-05-09 ENCOUNTER — Other Ambulatory Visit (HOSPITAL_COMMUNITY): Payer: Self-pay | Admitting: Cardiology

## 2023-05-13 ENCOUNTER — Other Ambulatory Visit: Payer: Self-pay

## 2023-05-13 ENCOUNTER — Other Ambulatory Visit: Payer: Self-pay | Admitting: Family Medicine

## 2023-05-14 ENCOUNTER — Ambulatory Visit (HOSPITAL_COMMUNITY)
Admission: RE | Admit: 2023-05-14 | Discharge: 2023-05-14 | Disposition: A | Discharge status: Home / Self Care | Source: Ambulatory Visit | Attending: Internal Medicine | Admitting: Internal Medicine

## 2023-05-14 DIAGNOSIS — I6522 Occlusion and stenosis of left carotid artery: Secondary | ICD-10-CM | POA: Insufficient documentation

## 2023-05-15 ENCOUNTER — Telehealth (HOSPITAL_COMMUNITY): Payer: Self-pay

## 2023-05-15 ENCOUNTER — Other Ambulatory Visit: Payer: Self-pay | Admitting: Internal Medicine

## 2023-05-15 DIAGNOSIS — I6523 Occlusion and stenosis of bilateral carotid arteries: Secondary | ICD-10-CM

## 2023-05-15 DIAGNOSIS — I6522 Occlusion and stenosis of left carotid artery: Secondary | ICD-10-CM

## 2023-05-15 NOTE — Telephone Encounter (Signed)
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  Repeat study in 1 year.

## 2023-05-15 NOTE — Telephone Encounter (Signed)
 Spoke with patient regarding the following results. Patient made aware and patient verbalized understanding.   Repeat Carotid ultrasound ordered for  next year.   Advised patient to call back to office with any issues, questions, or concerns. Patient verbalized understanding.

## 2023-05-18 ENCOUNTER — Inpatient Hospital Stay: Payer: Medicare PPO | Attending: Hematology and Oncology

## 2023-05-18 ENCOUNTER — Telehealth: Payer: Self-pay | Admitting: Internal Medicine

## 2023-05-18 ENCOUNTER — Inpatient Hospital Stay (HOSPITAL_BASED_OUTPATIENT_CLINIC_OR_DEPARTMENT_OTHER): Payer: Medicare PPO | Admitting: Hematology and Oncology

## 2023-05-18 ENCOUNTER — Other Ambulatory Visit: Payer: Self-pay | Admitting: Hematology and Oncology

## 2023-05-18 ENCOUNTER — Other Ambulatory Visit: Payer: Self-pay

## 2023-05-18 VITALS — BP 111/65 | HR 55 | Temp 98.0°F | Resp 14 | Wt 183.6 lb

## 2023-05-18 DIAGNOSIS — C8319 Mantle cell lymphoma, extranodal and solid organ sites: Secondary | ICD-10-CM

## 2023-05-18 DIAGNOSIS — C830A Small cell b-cell lymphoma, in remission: Secondary | ICD-10-CM | POA: Diagnosis not present

## 2023-05-18 LAB — CBC WITH DIFFERENTIAL (CANCER CENTER ONLY)
Abs Immature Granulocytes: 0.01 10*3/uL (ref 0.00–0.07)
Basophils Absolute: 0 10*3/uL (ref 0.0–0.1)
Basophils Relative: 1 %
Eosinophils Absolute: 0 10*3/uL (ref 0.0–0.5)
Eosinophils Relative: 0 %
HCT: 40.3 % (ref 39.0–52.0)
Hemoglobin: 12.9 g/dL — ABNORMAL LOW (ref 13.0–17.0)
Immature Granulocytes: 0 %
Lymphocytes Relative: 24 %
Lymphs Abs: 1 10*3/uL (ref 0.7–4.0)
MCH: 28.1 pg (ref 26.0–34.0)
MCHC: 32 g/dL (ref 30.0–36.0)
MCV: 87.8 fL (ref 80.0–100.0)
Monocytes Absolute: 0.5 10*3/uL (ref 0.1–1.0)
Monocytes Relative: 12 %
Neutro Abs: 2.7 10*3/uL (ref 1.7–7.7)
Neutrophils Relative %: 63 %
Platelet Count: 226 10*3/uL (ref 150–400)
RBC: 4.59 MIL/uL (ref 4.22–5.81)
RDW: 14 % (ref 11.5–15.5)
WBC Count: 4.2 10*3/uL (ref 4.0–10.5)
nRBC: 0 % (ref 0.0–0.2)

## 2023-05-18 LAB — CMP (CANCER CENTER ONLY)
ALT: 24 U/L (ref 0–44)
AST: 22 U/L (ref 15–41)
Albumin: 4.4 g/dL (ref 3.5–5.0)
Alkaline Phosphatase: 32 U/L — ABNORMAL LOW (ref 38–126)
Anion gap: 5 (ref 5–15)
BUN: 22 mg/dL (ref 8–23)
CO2: 29 mmol/L (ref 22–32)
Calcium: 8.9 mg/dL (ref 8.9–10.3)
Chloride: 105 mmol/L (ref 98–111)
Creatinine: 1.59 mg/dL — ABNORMAL HIGH (ref 0.61–1.24)
GFR, Estimated: 46 mL/min — ABNORMAL LOW (ref >60)
Glucose, Bld: 104 mg/dL — ABNORMAL HIGH (ref 70–99)
Potassium: 4.5 mmol/L (ref 3.5–5.1)
Sodium: 139 mmol/L (ref 135–145)
Total Bilirubin: 0.9 mg/dL (ref 0.0–1.2)
Total Protein: 6.6 g/dL (ref 6.5–8.1)

## 2023-05-18 LAB — LACTATE DEHYDROGENASE: LDH: 220 U/L — ABNORMAL HIGH (ref 98–192)

## 2023-05-18 MED ORDER — SACUBITRIL-VALSARTAN 97-103 MG PO TABS: 1.0000 | ORAL_TABLET | Freq: Two times a day (BID) | ORAL | 0 refills | Status: DC

## 2023-05-18 NOTE — Progress Notes (Signed)
 [facility] Telephone:([phone]) [phone]   [phone]  PROGRESS NOTE  Patient Care Team: Gary Mulder, DO as PCP - General (Family Medicine) Gary Spar Aviva Lemmings, MD as PCP - Cardiology (Cardiology) Gary Byes, MD as PCP - Electrophysiology (Cardiology) Gary Ebbing, MD as Consulting Physician (General Surgery) Gary Bonds, MD as Consulting Physician (Gastroenterology) Gary Lamer, MD as Referring Physician (Internal Medicine) Gary Linger, MD as Referring Physician (Hematology and Oncology) Gary Beauvais, MD (Inactive) as Consulting Physician (Cardiothoracic Surgery) Gary Bame, MD as Consulting Physician (Hematology and Oncology)  Hematological/Oncological History # Marginal Zone Lymphoma, In Remission  (a) s/p coil embolization of the feeding (gastro-duodenal) artery 01/23/2015             (b) anemia--scant iron stores on bone marrow biopsy--s/p feraheme 02/13/2015   (1) right inguinal lymph node biopsy 01/22/2015 confirms mantle cell lymphoma             (a) bone marrow biopsy 02/06/2015 positive for involvement by the patient's mantle cell lymphoma             (b) IPI score of 5 (high risk) predicts a 5 year progression free survival of 50% with CHOP-Rituxan chemotherapy             (c) MIPI score of 5 (intermediate risk) predicts a median survival of 58 months             (d) the cells were CD5 positive, CD19 and 20+, CD23 negative, lambda restricted   (2) CHOP/Rituxan started 02/09/2015, completed 8 cycles 07/05/2015; total doxorubicin dose 300 mg/M2   (3)  [facility] consultation 04/06/2015. Patient opted against transplant consolidation   (4) rituximab maintenance started 08/16/2015, repeat every 2 months, discontinued February 2019             (a) PET scan had shown a suggestion of recurrence, but see (6) below   (5) PSA increase noted July 2017, back to baseline by December 2017             (a) also noted to  be high (13.0) October 2021, resolved with antibiotics   (6) restaging PET scan 11/17/2016 is consistent with disease progression             (a) repeat PET scan 02/02/2017 confirms increase in lymph nodes             (b) PET scan 04/20/2017 shows increasing hypermetabolic adenopathy             (c) bronchoscopy/mediastinoscopy 05/05/2017 shows granulomatous disease, noncaseating, with negative AFB/ GMS stains; <10% of lymphocytes are B-cell with slight lambda excess             (d) PET scan on 07/06/2017 shows mild increase in mediastinal lymphadenopathy. Increased hypermetabolic supraclavicular lymph node.             (e) PET scan 12/24/2017 interpreted as showing evidence of partial response                    (f) PET 09/21/2018 shows decreased size and metabolism of scattered measurable nodes; Deauville 4             (g) PET scan 11/29/2019 essentially unchanged except for increased prostate activity (see #10 below).             (h) thoracic surgery (Dr. Lynnea Taylor) feels it would be very difficult to obtain a biopsy of the small positive areas in the  most recent scan             (i) PET scan 11/13/2020 shows decreased activity in the previously noted subcentimeter Deauville-4 mediastinal and hilar adenopathy              (7) ibrutinib 420 mg daily started 02/09/2017, discontinued March 2019             (a) resumed April 2019 at 560 mg daily             (b) dose back to 420 mg daily as of May 2020  04/09/2021: Transition care to Dr. Leonides Taylor due to Dr. Darrall Taylor retirement.  Patient continuing on ibrutinib 420 mg p.o. daily  Interval History:  [name] 73 y.o. male with medical history significant for mantle cell lymphoma in remission who presents for a follow up visit. The patient's last visit was on 02/09/2023. In the interim since the last visit he has had no major changes in his health.  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  He reports he had been working extensively in the yard, sometimes mowing his yard, his daughter's yard and his son's yard.  He reports they are over anchor each.  He reports that he is taking his ibrutinib faithfully and rarely misses a dose.  He reports that he does feel cold was not having any side effects.  He denies any infectious symptoms such as runny nose, sore throat, cough.  He is not having any palpitations or easy bleeding/bruising.  He reports that he is at his baseline level of health with no major changes in his medications. Overall he feels well and is willing and able to continue on ibrutinib therapy at this time.  He denies any fevers, chills, sweats, nausea, vomiting or diarrhea.  A full 10 point ROS is listed below.  MEDICAL HISTORY:  Past Medical History:  Diagnosis Date   Anemia    Cancer (HCC)    Lymphoma   Essential hypertension    GERD (gastroesophageal reflux disease)    GI bleed    HLD (hyperlipidemia)    Weakness     SURGICAL HISTORY: Past Surgical History:  Procedure Laterality Date   ESOPHAGOGASTRODUODENOSCOPY Left 01/18/2015   Procedure: ESOPHAGOGASTRODUODENOSCOPY (EGD);  Surgeon: Gary Rakes, MD;  Location[facility] ENDOSCOPY;  Service: Endoscopy;  Laterality: Left;   INGUINAL HERNIA REPAIR Right 01/22/2015   Procedure: RIGHT INGUINAL LYMPH NODE BX;  Surgeon: Gary Rudd, MD;  Location: [facility] OR;  Service: General;  Laterality: Right;   IR REMOVAL TUN ACCESS W/ PORT W/O FL MOD SED  01/24/2022   MEDIASTINOSCOPY N/A 05/05/2017   Procedure: MEDIASTINOSCOPY;  Surgeon: Gary Ovens, MD;  Location: [facility] OR;  Service: Thoracic;  Laterality: N/A;   PORTACATH PLACEMENT  01/26/2015    power port with tip SVC/RA Junction   SKIN SURGERY     Small benign cysts over left scalp removed   VIDEO BRONCHOSCOPY WITH ENDOBRONCHIAL ULTRASOUND N/A 05/05/2017   Procedure: VIDEO BRONCHOSCOPY WITH ENDOBRONCHIAL ULTRASOUND;  Surgeon: Gary Ovens, MD;   Location: [facility] OR;  Service: Thoracic;  Laterality: N/A;    SOCIAL HISTORY: Social History   Socioeconomic History   Marital status: Married    Spouse name: Not on file   Number of children: Not on file   Years of education: Not on file   Highest education level: Bachelor's degree (e.g., BA, AB,  BS)  Occupational History   Occupation: Retired  Tobacco Use   Smoking status: Never   Smokeless tobacco: Never  Vaping Use   Vaping status: Never Used  Substance and Sexual Activity   Alcohol use: Never    Alcohol/week: 6.0 standard drinks of alcohol    Types: 3 Cans of beer, 3 Shots of liquor per week   Drug use: No   Sexual activity: Yes    Partners: Female  Other Topics Concern   Not on file  Social History Narrative   Not on file   Social Drivers of Health   Financial Resource Strain: Low Risk  (05/23/2022)   Overall Financial Resource Strain (CARDIA)    Difficulty of Paying Living Expenses: Not hard at all  Food Insecurity: No Food Insecurity (01/05/2023)   Hunger Vital Sign    Worried About Running Out of Food in the Last Year: Never true    Ran Out of Food in the Last Year: Never true  Transportation Needs: No Transportation Needs (01/05/2023)   PRAPARE - Administrator, Civil Service (Medical): No    Lack of Transportation (Non-Medical): No  Physical Activity: Sufficiently Active (05/23/2022)   Exercise Vital Sign    Days of Exercise per Week: 5 days    Minutes of Exercise per Session: 30 min  Stress: No Stress Concern Present (04/02/2021)   [employer] of Occupational Health - Occupational Stress Questionnaire    Feeling of Stress : Not at all  Social Connections: Moderately Isolated (05/23/2022)   Social Connection and Isolation Panel [NHANES]    Frequency of Communication with Friends and Family: More than three times a week    Frequency of Social Gatherings with Friends and Family: Once a week    Attends Religious Services: Never    Loss adjuster, chartered or Organizations: No    Attends Engineer, structural: Not on file    Marital Status: Married  Catering manager Violence: Not At Risk (01/05/2023)   Humiliation, Afraid, Rape, and Kick questionnaire    Fear of Current or Ex-Partner: No    Emotionally Abused: No    Physically Abused: No    Sexually Abused: No    FAMILY HISTORY: Family History  Problem Relation Age of Onset   Hypertension Mother    Hypertension Father    Kidney failure Father    Hypertension Sister    Diabetes Sister    Lupus Sister    Prostate cancer Brother    Lung cancer Brother    Colon cancer Neg Hx    Stomach cancer Neg Hx     ALLERGIES:  has no known allergies.  MEDICATIONS:  Current Outpatient Medications  Medication Sig Dispense Refill   atorvastatin (LIPITOR) 80 MG tablet TAKE 1 TABLET BY MOUTH EVERY DAY 90 tablet 3   carvedilol (COREG) 12.5 MG tablet Take 1 tablet (12.5 mg total) by mouth 2 (two) times daily with a meal. NEEDS FOLLOW UP APPOINTMENT FOR MORE REFILLS 180 tablet 0   ELIQUIS 5 MG TABS tablet TAKE 1 TABLET BY MOUTH TWICE A DAY 180 tablet 1   folic acid (FOLVITE) 1 MG tablet Take 1 tablet (1 mg total) by mouth daily. 100 tablet 3   ibrutinib (IMBRUVICA) 420 MG tablet Take 1 tablet (420 mg) by mouth daily. 28 tablet 6   JARDIANCE 10 MG TABS tablet TAKE 1 TABLET BY MOUTH EVERY DAY BEFORE BREAKFAST 30 tablet 11   sacubitril-valsartan (ENTRESTO) 97-103 MG Take  1 tablet by mouth 2 (two) times daily. 60 tablet 0   spironolactone (ALDACTONE) 25 MG tablet TAKE 1 TABLET (25 MG TOTAL) BY MOUTH DAILY. NEEDS FOLLOW UP APPOINTMENT FOR MORE REFILLS 90 tablet 0   No current facility-administered medications for this visit.    REVIEW OF SYSTEMS:   Constitutional: ( - ) fevers, ( - )  chills , ( - ) night sweats Eyes: ( - ) blurriness of vision, ( - ) double vision, ( - ) watery eyes Ears, nose, mouth, throat, and face: ( - ) mucositis, ( - ) sore throat Respiratory: ( - ) cough, ( -  ) dyspnea, ( - ) wheezes Cardiovascular: ( - ) palpitation, ( - ) chest discomfort, ( - ) lower extremity swelling Gastrointestinal:  ( - ) nausea, ( - ) heartburn, ( - ) change in bowel habits Skin: ( - ) abnormal skin rashes Lymphatics: ( - ) new lymphadenopathy, ( - ) easy bruising Neurological: ( - ) numbness, ( - ) tingling, ( - ) new weaknesses Behavioral/Psych: ( - ) mood change, ( - ) new changes  All other systems were reviewed with the patient and are negative.  PHYSICAL EXAMINATION:  Vitals:   05/18/23 0955  BP: 111/65  Pulse: (!) 55  Resp: 14  Temp: 98 F (36.7 C)  SpO2: 100%    Filed Weights   05/18/23 0955  Weight: 183 lb 9.6 oz (83.3 kg)     GENERAL: Well-appearing middle-aged African-American male, alert, no distress and comfortable SKIN: skin color, texture, turgor are normal, no rashes or significant lesions EYES: conjunctiva are pink and non-injected, sclera clear LUNGS: clear to auscultation and percussion with normal breathing effort HEART: regular rate & rhythm and no murmurs and no lower extremity edema Musculoskeletal: no cyanosis of digits and no clubbing. Large mobile, soft mass on mid back most consistent with lipoma.  PSYCH: alert & oriented x 3, fluent speech NEURO: no focal motor/sensory deficits  LABORATORY DATA:  I have reviewed the data as listed    Latest Ref Rng & Units 05/18/2023    9:22 AM 02/09/2023    7:43 AM 11/17/2022    9:21 AM  CBC  WBC 4.0 - 10.5 K/uL 4.2  4.1  4.0   Hemoglobin 13.0 - 17.0 g/dL 16.1  09.6  04.5   Hematocrit 39.0 - 52.0 % 40.3  39.8  35.5   Platelets 150 - 400 K/uL 226  222  233        Latest Ref Rng & Units 05/18/2023    9:22 AM 02/09/2023    7:43 AM 11/17/2022    9:21 AM  CMP  Glucose 70 - 99 mg/dL 409  79  98   BUN 8 - 23 mg/dL 22  19  19    Creatinine 0.61 - 1.24 mg/dL 8.11  9.14  7.82   Sodium 135 - 145 mmol/L 139  143  139   Potassium 3.5 - 5.1 mmol/L 4.5  4.5  4.3   Chloride 98 - 111 mmol/L 105   109  106   CO2 22 - 32 mmol/L 29  29  28    Calcium 8.9 - 10.3 mg/dL 8.9  9.3  9.3   Total Protein 6.5 - 8.1 g/dL 6.6  6.6  6.6   Total Bilirubin 0.0 - 1.2 mg/dL 0.9  0.7  1.0   Alkaline Phos 38 - 126 U/L 32  28  28   AST 15 - 41 U/L 22  17  19   ALT 0 - 44 U/L 24  17  17      RADIOGRAPHIC STUDIES: VAS US CAROTID Result Date: 05/17/2023 Carotid Arterial Duplex Study Patient Name:  BROADUS COSTILLA  Date of Exam:   05/14/2023 Medical Rec #: [id number]        Accession #:    [id number] Date of Birth: [birth year]       Patient Gender: M Patient Age:   89 years Exam Location:  [facility] Procedure:      VAS US CAROTID Referring Phys: PRAMOD SETHI --------------------------------------------------------------------------------  Indications:       History of CVA and Carotid artery disease. Patient denies any                    cerebrovascular symptoms at this time. Risk Factors:      Hypertension, hyperlipidemia, no history of smoking, prior                    CVA. Comparison Study:  Previous carotid duplex performed 04/24/22 showed RICA                    velocities of 75/30 cm/sec and LICA velocities of 236/114                    cm/sec. Performing Technologist: Olegario Shearer RVT  Examination Guidelines: A complete evaluation includes B-mode imaging, spectral Doppler, color Doppler, and power Doppler as needed of all accessible portions of each vessel. Bilateral testing is considered an integral part of a complete examination. Limited examinations for reoccurring indications may be performed as noted.  Right Carotid Findings: +----------+--------+--------+--------+-----------------------+--------+           PSV cm/sEDV cm/sStenosisPlaque Description     Comments +----------+--------+--------+--------+-----------------------+--------+ CCA Prox  56      14                                              +----------+--------+--------+--------+-----------------------+--------+ CCA Distal67      20                                               +----------+--------+--------+--------+-----------------------+--------+ ICA Prox  40      12              diffuse and hyperechoic         +----------+--------+--------+--------+-----------------------+--------+ ICA Mid   78      23      1-39%                                   +----------+--------+--------+--------+-----------------------+--------+ ICA Distal65      34                                              +----------+--------+--------+--------+-----------------------+--------+ ECA       70      18                                              +----------+--------+--------+--------+-----------------------+--------+ +----------+--------+-------+----------------+-------------------+  PSV cm/sEDV cmsDescribe        Arm Pressure (mmHG) +----------+--------+-------+----------------+-------------------+ [id number]            Multiphasic, WNL                    +----------+--------+-------+----------------+-------------------+ +---------+--------+--+--------+--+---------+ VertebralPSV cm/s28EDV cm/s10Antegrade +---------+--------+--+--------+--+---------+  Left Carotid Findings: +----------+--------+--------+--------+------------------+-----------------+           PSV cm/sEDV cm/sStenosisPlaque DescriptionComments          +----------+--------+--------+--------+------------------+-----------------+ CCA Prox  86      27                                                  +----------+--------+--------+--------+------------------+-----------------+ CCA Distal73      24                                                  +----------+--------+--------+--------+------------------+-----------------+ ICA Prox  243     101     60-79%  hypoechoic        high-end of range +----------+--------+--------+--------+------------------+-----------------+ ICA Mid   256     93                                                   +----------+--------+--------+--------+------------------+-----------------+ ICA Distal56      21                                                  +----------+--------+--------+--------+------------------+-----------------+ ECA       81      14                                                  +----------+--------+--------+--------+------------------+-----------------+ +----------+--------+--------+----------------+-------------------+           PSV cm/sEDV cm/sDescribe        Arm Pressure (mmHG) +----------+--------+--------+----------------+-------------------+ Subclavian160     0       Multiphasic, WNL                    +----------+--------+--------+----------------+-------------------+ +---------+--------+--+--------+--+---------+ VertebralPSV cm/s46EDV cm/s13Antegrade +---------+--------+--+--------+--+---------+ Left ICA velocities are essentially stable compared to previous exams.  Summary: Right Carotid: Velocities in the right ICA are consistent with a 1-39% stenosis. Left Carotid: Velocities in the left ICA are consistent with a 60-79% stenosis,               high-end of range. Vertebrals:  Bilateral vertebral arteries demonstrate antegrade flow. Subclavians: Normal flow hemodynamics were seen in bilateral subclavian              arteries. *See table(s) above for measurements and observations. Suggest follow up study in 12 months. Electronically signed by Antionette Kirks MD on 05/17/2023 at 6:28:00 PM.    Final    CT CHEST ABDOMEN PELVIS W CONTRAST Result Date:  05/04/2023 CLINICAL DATA:  Assess treatment response of mantle cell lymphoma of extranodal and solid organ sites. Remission since 2017. * Tracking Code: BO * EXAM: CT CHEST, ABDOMEN, AND PELVIS WITH CONTRAST TECHNIQUE: Multidetector CT imaging of the chest, abdomen and pelvis was performed following the standard protocol during bolus administration of intravenous contrast. RADIATION DOSE REDUCTION:  This exam was performed according to the departmental dose-optimization program which includes automated exposure control, adjustment of the mA and/or kV according to patient size and/or use of iterative reconstruction technique. CONTRAST:  100mL OMNIPAQUE IOHEXOL 300 MG/ML  SOLN COMPARISON:  Multiple priors including CT November 13, 2022. FINDINGS: CT CHEST FINDINGS Cardiovascular: Aortic atherosclerosis. Normal size heart. No significant pericardial effusion/thickening. Mediastinum/Nodes: No suspicious thyroid nodule. No pathologically enlarged mediastinal, hilar or axillary lymph nodes. Esophagus is grossly unremarkable. Lungs/Pleura: Irregular nodule in the right upper lobe measures 11 x 6 mm on image 51/5 previously 12 x 6 mm. No new suspicious pulmonary nodules or masses. Linear band of scarring/atelectasis in the right middle lobe. Osteophyte related scarring in the paramedian right lower lobe. Musculoskeletal: No aggressive lytic or blastic lesion of bone. Multilevel degenerative changes spine. CT ABDOMEN PELVIS FINDINGS Hepatobiliary: No suspicious hepatic lesion. Gallbladder is unremarkable. No biliary ductal dilation. Pancreas: No pancreatic ductal dilation or evidence of acute inflammation. Spleen: No splenomegaly. Adrenals/Urinary Tract: No suspicious adrenal nodule/mass. No hydronephrosis. Kidneys demonstrate symmetric enhancement. Urinary bladder is unremarkable for degree of distension. Stomach/Bowel: Stomach is unremarkable for degree of distension. No pathologic dilation of small or large bowel. Colonic stool burden compatible with constipation. Vascular/Lymphatic: Aortic atherosclerosis. Smooth IVC contours. The portal, splenic and superior mesenteric veins are patent. Embolization coils in the upper abdomen adjacent to the head of the pancreas stable from prior. No pathologically enlarged abdominal or pelvic lymph nodes. Reproductive: Enlarged prostate gland. Other: No significant abdominopelvic  free fluid. Musculoskeletal: No aggressive lytic or blastic lesion of bone. Multilevel degenerative changes of the spine. Degenerative change of the bilateral hips and SI joints with partial bony ankylosis of the SI joints. IMPRESSION: 1. No pathologically enlarged lymph nodes in the chest, abdomen or pelvis. No splenomegaly. 2. Irregular nodule in the right upper lobe measures 11 x 6 mm previously 12 x 6 mm, nonspecific. Suggest continued attention on follow-up imaging. 3. Colonic stool burden compatible with constipation. 4. Enlarged prostate gland. 5. Aortic atherosclerosis. Electronically Signed   By: Tama Fails M.D.   On: 05/04/2023 16:13    ASSESSMENT & PLAN [name] 73 y.o. male with medical history significant for mantle cell lymphoma in remission who presents for a follow up visit.  #Mantle Cell Lymphoma in Remission -- Prior PET CT scan in October 2022 showed no evidence of residual or recurrent disease, though there were some lymph nodes concerning for sarcoidosis --Patient has been remission since at least 06/11/2015 at which time his PET CT scan showed no evidence of recurrent or residual disease. --CT on 05/04/2023,  no clear evidence of relapsed/recurrent disease. Recommend standard CT scan q 6 months. CT scan due Sept 2025 --Labs from today were reviewed and require no intervention. Labs show white blood cell 4.2, Hgb 12.9, MCV 87.8, Plt 226 --Continue ibrutinib 420 mg p.o. daily --Return to clinic 3 months with labs.   No orders of the defined types were placed in this encounter.   All questions were answered. The patient knows to call the clinic with any problems, questions or concerns.  I have spent a total of 30 minutes  minutes of face-to-face and non-face-to-face time, preparing to see the patient performing a medically appropriate examination, counseling and educating the patient, ordering tests, documenting clinical information in the electronic health record, and  care coordination.   Rogerio Clay, MD Department of Hematology/Oncology [facility] Phone: [phone] Pager: [phone] Email: [email or web address]  05/18/2023 10:46 AM

## 2023-05-18 NOTE — Telephone Encounter (Signed)
*  STAT* If patient is at the pharmacy, call can be transferred to refill team.   1. Which medications need to be refilled? (please list name of each medication and dose if known) sacubitril-valsartan (ENTRESTO) 97-103 MG   Take 1 tablet by mouth 2 (two) times daily.    2. Would you like to learn more about the convenience, safety, & potential cost savings by using the [facility]?No   3. Are you open to using the [facility] No   4. Which pharmacy/location (including street and city if local pharmacy) is medication to be sent to?[id number] - [place], [place] - [address]    5. Do they need a 30 day or 90 day supply? 90 Day Supply

## 2023-05-19 ENCOUNTER — Other Ambulatory Visit: Payer: Self-pay

## 2023-05-19 NOTE — Progress Notes (Signed)
 Specialty Pharmacy Refill Coordination Note  [name] is a 73 y.o. male contacted today regarding refills of specialty medication(s) Ibrutinib (IMBRUVICA)   Patient requested Delivery   Delivery date: 05/22/23   Verified address: [address] [place]  [phone]   Medication will be filled on 05/21/23.

## 2023-05-19 NOTE — Progress Notes (Signed)
 Specialty Pharmacy Ongoing Clinical Assessment Note  [name] is a 73 y.o. male who is being followed by the specialty pharmacy service for RxSp Oncology   Patient's specialty medication(s) reviewed today: Ibrutinib (IMBRUVICA)   Missed doses in the last 4 weeks: 1   Patient/Caregiver did not have any additional questions or concerns.   Therapeutic benefit summary: Patient is achieving benefit   Adverse events/side effects summary: No adverse events/side effects   Patient's therapy is appropriate to: Continue    Goals Addressed             This Visit's Progress    Achieve or maintain remission       Patient is on track. Patient will maintain adherence. CT from 05/04/23 shows no clear evidence of recurrence.          Follow up:  6 months  Ogden Handlin M Idara [place] Specialty Pharmacist

## 2023-05-21 ENCOUNTER — Other Ambulatory Visit: Payer: Self-pay

## 2023-05-24 NOTE — Progress Notes (Signed)
 Agree with the plan stated above

## 2023-06-02 ENCOUNTER — Telehealth: Payer: Self-pay | Admitting: Internal Medicine

## 2023-06-02 ENCOUNTER — Other Ambulatory Visit: Payer: Self-pay | Admitting: Cardiology

## 2023-06-02 ENCOUNTER — Other Ambulatory Visit: Payer: Self-pay | Admitting: Neurology

## 2023-06-02 ENCOUNTER — Other Ambulatory Visit: Payer: Self-pay | Admitting: Internal Medicine

## 2023-06-02 DIAGNOSIS — I480 Paroxysmal atrial fibrillation: Secondary | ICD-10-CM

## 2023-06-02 MED ORDER — ATORVASTATIN CALCIUM 80 MG PO TABS: 80.0000 mg | ORAL_TABLET | Freq: Every day | ORAL | 0 refills | Status: AC

## 2023-06-02 MED ORDER — SPIRONOLACTONE 25 MG PO TABS: 25.0000 mg | ORAL_TABLET | Freq: Every day | ORAL | 0 refills | Status: DC

## 2023-06-02 NOTE — Telephone Encounter (Signed)
 RX sent to requested Pharmacy

## 2023-06-02 NOTE — Telephone Encounter (Signed)
*  STAT* If patient is at the pharmacy, call can be transferred to refill team.   1. Which medications need to be refilled? (please list name of each medication and dose if known)   atorvastatin  (LIPITOR ) 80 MG tablet  spironolactone  (ALDACTONE ) 25 MG tablet   2. Would you like to learn more about the convenience, safety, & potential cost savings by using the [facility]?   3. Are you open to using the [facility] (Type [facility]. ).  4. Which pharmacy/location (including street and city if local pharmacy) is medication to be sent to?  [id number] - [place], [place] - [address]   5. Do they need a 30 day or 90 day supply?   90 day  Patient stated he is almost out of these medications.  Patient has appointment scheduled with Dr. Maximo Spar on 5/16.

## 2023-06-02 NOTE — Telephone Encounter (Signed)
 Last seen on 11/03/22 Follow up scheduled on 11/03/23

## 2023-06-10 ENCOUNTER — Other Ambulatory Visit: Payer: Self-pay

## 2023-06-11 ENCOUNTER — Other Ambulatory Visit (HOSPITAL_COMMUNITY): Payer: Self-pay

## 2023-06-11 ENCOUNTER — Other Ambulatory Visit: Payer: Self-pay

## 2023-06-11 NOTE — Progress Notes (Signed)
 Specialty Pharmacy Refill Coordination Note  [name] is a 73 y.o. male contacted today regarding refills of specialty medication(s) Imbruvica .  Patient requested (Patient-Rptd) Delivery   Delivery date: (Patient-Rptd) 06/23/23   Verified address: (Patient-Rptd) [address] [place] [address]   Medication will be filled on 06/22/23.

## 2023-06-19 ENCOUNTER — Ambulatory Visit: Attending: Internal Medicine | Admitting: Internal Medicine

## 2023-06-19 ENCOUNTER — Encounter: Payer: Self-pay | Admitting: Internal Medicine

## 2023-06-19 VITALS — BP 108/72 | HR 66 | Ht 69.0 in | Wt 179.6 lb

## 2023-06-19 DIAGNOSIS — E782 Mixed hyperlipidemia: Secondary | ICD-10-CM | POA: Diagnosis not present

## 2023-06-19 DIAGNOSIS — I5022 Chronic systolic (congestive) heart failure: Secondary | ICD-10-CM | POA: Diagnosis not present

## 2023-06-19 DIAGNOSIS — Z0001 Encounter for general adult medical examination with abnormal findings: Secondary | ICD-10-CM | POA: Diagnosis not present

## 2023-06-19 DIAGNOSIS — R944 Abnormal results of kidney function studies: Secondary | ICD-10-CM | POA: Diagnosis not present

## 2023-06-19 DIAGNOSIS — Z8673 Personal history of transient ischemic attack (TIA), and cerebral infarction without residual deficits: Secondary | ICD-10-CM | POA: Diagnosis not present

## 2023-06-19 DIAGNOSIS — I110 Hypertensive heart disease with heart failure: Secondary | ICD-10-CM | POA: Diagnosis not present

## 2023-06-19 DIAGNOSIS — I7121 Aneurysm of the ascending aorta, without rupture: Secondary | ICD-10-CM | POA: Diagnosis not present

## 2023-06-19 DIAGNOSIS — E785 Hyperlipidemia, unspecified: Secondary | ICD-10-CM | POA: Diagnosis not present

## 2023-06-19 DIAGNOSIS — I480 Paroxysmal atrial fibrillation: Secondary | ICD-10-CM | POA: Diagnosis not present

## 2023-06-19 DIAGNOSIS — I2510 Atherosclerotic heart disease of native coronary artery without angina pectoris: Secondary | ICD-10-CM | POA: Diagnosis not present

## 2023-06-19 DIAGNOSIS — C831A Mantle cell lymphoma, in remission: Secondary | ICD-10-CM | POA: Diagnosis not present

## 2023-06-19 DIAGNOSIS — Z79899 Other long term (current) drug therapy: Secondary | ICD-10-CM | POA: Diagnosis not present

## 2023-06-19 NOTE — Patient Instructions (Addendum)
 Medication Instructions:  Your physician recommends that you continue on your current medications as directed. Please refer to the Current Medication list given to you today.  *If you need a refill on your cardiac medications before your next appointment, please call your pharmacy*  Lab Work: Fasting Lipid Panel in July 2025 If you have labs (blood work) drawn today and your tests are completely normal, you will receive your results only by: MyChart Message (if you have MyChart) OR A paper copy in the mail If you have any lab test that is abnormal or we need to change your treatment, we will call you to review the results.  Testing/Procedures: Your physician has requested that you have an echocardiogram. Echocardiography is a painless test that uses sound waves to create images of your heart. It provides your doctor with information about the size and shape of your heart and how well your heart's chambers and valves are working. This procedure takes approximately one hour. There are no restrictions for this procedure. Please do NOT wear cologne, perfume, aftershave, or lotions (deodorant is allowed). Please arrive 15 minutes prior to your appointment time.   Follow-Up: At [facility], you and your health needs are our priority.  As part of our continuing mission to provide you with exceptional heart care, our providers are all part of one team.  This team includes your primary Cardiologist (physician) and Advanced Practice Providers or APPs (Physician Assistants and Nurse Practitioners) who all work together to provide you with the care you need, when you need it.  Your next appointment:   Dr. Maximo Spar - pending echo and labs

## 2023-06-19 NOTE — Progress Notes (Signed)
 OFFICE NOTE  Chief Complaint:  Follow-up heart failure  Primary Care Physician: Ulysees Gander, MD  HPI:  [name] is a 73 y.o. male with a past medial history significant for non-Hodgkin's lymphoma who presented to the emergency room 08/01/2019 with balance issues.  Patient said he had done his usual workout and was doing some work around the house when he noticed his balance was "off".  He also developed some left arm pain.  He had not had this before.  He went to the emergency room.  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  Carotid Dopplers revealed a 1 to 39% right internal carotid artery narrowing and a 60 to 79% left internal carotid artery narrowing.  Echocardiogram revealed cardiomyopathy with an EF of 35 to 40% with global hypokinesis.  He also had grade 1 diastolic dysfunction, moderate left atrial enlargement.  He was noted to have frequent ectopy with PACs and PVCs.  The hospital notes and discharge summary mention atrial fibrillation, this was not mentioned by Dr. Maximo Spar and I could find no evidence of atrial fibrillation by his EKGs.  He did have frequent ectopy.  The patient was placed on aspirin  and Plavix  although today he tells me he has not been taking aspirin .  His Mevacor  was changed to Lipitor  80 mg. His lisinopril  HCTZ was changed to Entresto .  Dr. Candida Chalk note also mentions adding low-dose carvedilol  but this was not added at discharge.  The plan is for outpatient ischemic evaluation with a coronary CT scan.  Since discharge the patient says he is done well.  The patient is an avid exerciser and goes to the gym.  He used to be a runner but now just walks daily for exercise.  I asked him to back off on his weight lifting and push-ups.  I suggested he could do low weights, 10 pounds or so with high reps.  I suggested he continue walking for exercise as tolerated.  He and his wife have gone on a no sodium diet. He denies any LE edema or  orthopnea.   09/29/2019  [name] is seen today in follow-up.  He was seen by Humphrey Magnuson, PA-C for hospital follow-up.  I saw him first in the hospital.  We were trying to work-up his cardiomyopathy as to whether it was ischemic or nonischemic.  Because of some abnormal beats and other features I recommended outpatient stress testing or cardiac CT.  In addition [name] had some orthostatic symptoms and apparently was taken off of Entresto  and placed on a beta-blocker.  Those seem to have resolved and vitals however did not indicate orthostasis.  CT coronary angiogram was performed on 09/09/2019 showed a calcium  score 145 which was 78th percentile for age and sex matched control.  There was some mild plaque in the RCA minimal plaque in the LAD and circumflex.  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  The aortic valve was also mildly calcified.  I reviewed those findings with him today.  Overall he seems to be feeling well and I would describe his cardiomyopathy as nonischemic.  Blood pressure is somewhat elevated today 156/92 and seems to be top normal to mildly elevated at home as well.  01/02/2020  [name] is seen today in follow-up.  Overall he is doing well.  He seems to be tolerating medication changes.  Blood pressure is very good today 132/80.  He denies any  chest pain or worsening shortness of breath.  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  This is being followed expectantly.  He is currently on some antibiotics.  He did have a lipid profile 2 months ago which showed total cholesterol 124, HDL 41, LDL 67 and triglycerides 84.  07/04/2020  [name] returns today for follow-up.  He recently underwent a repeat echo in hopes that his heart function would have improved on Entresto  however unfortunately the echo shows some further decline in his EF.  Weight is come down a good bit in fact he is down about 10 pounds.  He says he exercises daily including aerobic  activity and weightlifting.  He endorses no more than NYHA class I symptoms.  His wife says that he does seem a little more fatigued at times and that might be due to the fact that he has worsening cardiomyopathy.  His repeat echo unfortunately showed a decline in LVEF to 25 to 30% from 40% in 2001.  He is on immunotherapy for mantle cell non-Hodgkin's lymphoma.  I am not sure certain whether this could be contributing to it or not.  02/18/2021  [name] returns today for follow-up.  He seems to be doing well.  I had referred him to the heart failure clinic and has been followed by Dr. Mitzie Anda who did a cardiac MRI and is optimized his medications.  Most recent LVEF is now up to 43%.  He is scheduled for repeat echo in about 3 months.  Unfortunately he has had stroke in the past and was found to have moderate to severe left internal carotid artery stenosis, not thought to be the cause of his stroke rather was possibly A. fib.  Because of paroxysmal A. fib he has been seen by Dr. Marven Slimmer and recommended to be on Eliquis  5 mg twice daily.  This has been an issue because of cost.  He had received support from the company but this requires him to pay at least a certain amount to meet a deductible before it is covered.  As it is January he has not met his deductible.  06/19/2023  [name] is seen today in follow-up for heart failure.  He continues to do very well.  Nuys any shortness of breath, edema, orthopnea or heart failure symptoms.  He has NYHA class I.  He exercises regularly and still lifts weights.  He has not had any LV functional assessment since 2023.  He has been in remission for mantle cell lymphoma since 2017.  He also had had a stroke but has been on Eliquis  and has suffered no long-term effects from that.  He denies any bleeding issues.  Blood pressure is well-controlled today.  EKG shows a sinus bradycardia with arrhythmia but no PVCs.  PMHx:  Past Medical History:  Diagnosis Date   Anemia     Cancer (HCC)    Lymphoma   Essential hypertension    GERD (gastroesophageal reflux disease)    GI bleed    HLD (hyperlipidemia)    Weakness     Past Surgical History:  Procedure Laterality Date   ESOPHAGOGASTRODUODENOSCOPY Left 01/18/2015   Procedure: ESOPHAGOGASTRODUODENOSCOPY (EGD);  Surgeon: Baldo Bonds, MD;  [facility]: [facility] ENDOSCOPY;  Service: Endoscopy;  Laterality: Left;   INGUINAL HERNIA REPAIR Right 01/22/2015   Procedure: RIGHT INGUINAL LYMPH NODE BX;  Surgeon: Dareen Ebbing, MD;  Location: [facility] OR;  Service: General;  Laterality: Right;   IR REMOVAL TUN ACCESS  W/ PORT W/O FL MOD SED  01/24/2022   MEDIASTINOSCOPY N/A 05/05/2017   Procedure: MEDIASTINOSCOPY;  Surgeon: Norita Beauvais, MD;  Location[facility] OR;  Service: Thoracic;  Laterality: N/A;   PORTACATH PLACEMENT  01/26/2015    power port with tip SVC/RA [place]   SKIN SURGERY     Small benign cysts over left scalp removed   VIDEO BRONCHOSCOPY WITH ENDOBRONCHIAL ULTRASOUND N/A 05/05/2017   Procedure: VIDEO BRONCHOSCOPY WITH ENDOBRONCHIAL ULTRASOUND;  Surgeon: Norita Beauvais, MD;  Location: [facility] OR;  Service: Thoracic;  Laterality: N/A;    FAMHx:  Family History  Problem Relation Age of Onset   Hypertension Mother    Hypertension Father    Kidney failure Father    Hypertension Sister    Diabetes Sister    Lupus Sister    Prostate cancer Brother    Lung cancer Brother    Colon cancer Neg Hx    Stomach cancer Neg Hx     SOCHx:   reports that he has never smoked. He has never used smokeless tobacco. He reports that he does not drink alcohol and does not use drugs.  ALLERGIES:  No Known Allergies  ROS: Pertinent items noted in HPI and remainder of comprehensive ROS otherwise negative.  HOME MEDS: Current Outpatient Medications on File Prior to Visit  Medication Sig Dispense Refill   apixaban  (ELIQUIS ) 5 MG TABS tablet TAKE 1 TABLET BY MOUTH TWICE A DAY 180 tablet 0   atorvastatin  (LIPITOR ) 80 MG tablet  Take 1 tablet (80 mg total) by mouth daily. 30 tablet 0   carvedilol  (COREG ) 12.5 MG tablet Take 1 tablet (12.5 mg total) by mouth 2 (two) times daily with a meal. NEEDS FOLLOW UP APPOINTMENT FOR MORE REFILLS 180 tablet 0   ENTRESTO  97-103 MG TAKE 1 TABLET BY MOUTH TWICE A DAY 60 tablet 0   folic acid  (FOLVITE ) 1 MG tablet TAKE 1 TABLET BY MOUTH EVERY DAY 90 tablet 2   ibrutinib  (IMBRUVICA ) 420 MG tablet Take 1 tablet (420 mg) by mouth daily. 28 tablet 6   JARDIANCE  10 MG TABS tablet TAKE 1 TABLET BY MOUTH EVERY DAY BEFORE BREAKFAST 30 tablet 11   spironolactone  (ALDACTONE ) 25 MG tablet Take 1 tablet (25 mg total) by mouth daily. 30 tablet 0   No current facility-administered medications on file prior to visit.    LABS/IMAGING: No results found for this or any previous visit (from the past 48 hours). No results found.  LIPID PANEL:    Component Value Date/Time   CHOL 122 03/03/2022 1351   CHOL 124 10/21/2019 0904   TRIG 83.0 03/03/2022 1351   HDL 31.10 (L) 03/03/2022 1351   HDL 41 10/21/2019 0904   CHOLHDL 4 03/03/2022 1351   VLDL 16.6 03/03/2022 1351   LDLCALC 75 03/03/2022 1351   LDLCALC 67 10/21/2019 0904     WEIGHTS: Wt Readings from Last 3 Encounters:  06/19/23 179 lb 9.6 oz (81.5 kg)  05/18/23 183 lb 9.6 oz (83.3 kg)  02/09/23 187 lb 8 oz (85 kg)    VITALS: BP 108/72 (BP Location: Left Arm, Patient Position: Sitting, Cuff Size: Normal)   Pulse 66   Ht 5\' 9"  (1.753 m)   Wt 179 lb 9.6 oz (81.5 kg)   SpO2 98%   BMI 26.52 kg/m   EXAM: General appearance: alert and no distress Neck: no carotid bruit, no JVD and thyroid  not enlarged, symmetric, no tenderness/mass/nodules Lungs: clear to auscultation bilaterally Heart: regular rate  and rhythm, S1, S2 normal, no murmur, click, rub or gallop Abdomen: soft, non-tender; bowel sounds normal; no masses,  no organomegaly Extremities: extremities normal, atraumatic, no cyanosis or edema Pulses: 2+ and symmetric Skin: Skin  color, texture, turgor normal. 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   [name] is seen today in follow-up.  He seems to be doing very well.  NYHA class I symptoms and is very active.  His LVEF was 43% by MRI in 2022.  I like to update that with an echo now.  He did have a dilated ascending aorta but most recently on CT of the chest he was not noted to have any aortic enlargement.  Blood pressure is well-controlled.  He needs an updated lipid profile which could be drawn with his labs for the cancer center fasting in July.  Otherwise plan follow-up with me annually or sooner as necessary.  Hazle Lites, MD, [facility], FNLA, FACP  [place]  [facility]  Medical Director of the Advanced Lipid Disorders &  Cardiovascular Risk Reduction Clinic Diplomate of the American Board of Clinical Lipidology Attending Cardiologist  Direct Dial: [phone]  Fax: [phone]  Website:  [email or web address] 06/19/2023, 1:46 PM

## 2023-06-23 ENCOUNTER — Ambulatory Visit: Payer: Self-pay | Admitting: Internal Medicine

## 2023-06-23 ENCOUNTER — Ambulatory Visit (HOSPITAL_COMMUNITY)
Admission: RE | Admit: 2023-06-23 | Discharge: 2023-06-23 | Disposition: A | Discharge status: Home / Self Care | Source: Ambulatory Visit | Attending: Internal Medicine | Admitting: Internal Medicine

## 2023-06-23 DIAGNOSIS — I5022 Chronic systolic (congestive) heart failure: Secondary | ICD-10-CM | POA: Diagnosis not present

## 2023-06-23 DIAGNOSIS — E782 Mixed hyperlipidemia: Secondary | ICD-10-CM | POA: Diagnosis not present

## 2023-06-23 LAB — ECHOCARDIOGRAM COMPLETE
Area-P 1/2: 2.76 cm2
MV M vel: 4.84 m/s
MV Peak grad: 93.7 mmHg
P 1/2 time: 1176 ms
Radius: 0.7 cm
S' Lateral: 4.2 cm

## 2023-07-17 ENCOUNTER — Other Ambulatory Visit: Payer: Self-pay

## 2023-07-17 ENCOUNTER — Other Ambulatory Visit: Payer: Self-pay | Admitting: Pharmacy Technician

## 2023-07-17 ENCOUNTER — Other Ambulatory Visit: Payer: Self-pay | Admitting: Internal Medicine

## 2023-07-17 NOTE — Progress Notes (Signed)
 Specialty Pharmacy Refill Coordination Note  [name] is a 73 y.o. male contacted today regarding refills of specialty medication(s) Ibrutinib  (IMBRUVICA )   Patient requested Delivery   Delivery date: 07/20/23   Verified address: [address] [place]   Medication will be filled on 07/17/23.

## 2023-08-09 ENCOUNTER — Other Ambulatory Visit (HOSPITAL_COMMUNITY): Payer: Self-pay | Admitting: Cardiology

## 2023-08-13 ENCOUNTER — Other Ambulatory Visit: Payer: Self-pay

## 2023-08-17 ENCOUNTER — Other Ambulatory Visit: Payer: Self-pay

## 2023-08-17 ENCOUNTER — Other Ambulatory Visit: Payer: Self-pay | Admitting: Pharmacy Technician

## 2023-08-17 NOTE — Progress Notes (Signed)
 Specialty Pharmacy Refill Coordination Note  [name] is a 73 y.o. male contacted today regarding refills of specialty medication(s) Ibrutinib  (IMBRUVICA )   Patient requested Delivery   Delivery date: 08/19/23   Verified address: [address] [name]   Medication will be filled on 08/18/23.

## 2023-08-24 ENCOUNTER — Encounter: Payer: Self-pay | Admitting: Oncology

## 2023-08-24 ENCOUNTER — Other Ambulatory Visit: Payer: Self-pay | Admitting: Hematology and Oncology

## 2023-08-24 ENCOUNTER — Inpatient Hospital Stay (HOSPITAL_BASED_OUTPATIENT_CLINIC_OR_DEPARTMENT_OTHER): Admitting: Hematology and Oncology

## 2023-08-24 ENCOUNTER — Encounter (HOSPITAL_BASED_OUTPATIENT_CLINIC_OR_DEPARTMENT_OTHER): Payer: Self-pay

## 2023-08-24 ENCOUNTER — Inpatient Hospital Stay: Attending: Hematology and Oncology

## 2023-08-24 VITALS — BP 99/72 | HR 60 | Temp 97.6°F | Resp 16 | Ht 69.0 in | Wt 175.4 lb

## 2023-08-24 DIAGNOSIS — I6329 Cerebral infarction due to unspecified occlusion or stenosis of other precerebral arteries: Secondary | ICD-10-CM | POA: Diagnosis not present

## 2023-08-24 DIAGNOSIS — C8319 Mantle cell lymphoma, extranodal and solid organ sites: Secondary | ICD-10-CM

## 2023-08-24 DIAGNOSIS — N179 Acute kidney failure, unspecified: Secondary | ICD-10-CM | POA: Insufficient documentation

## 2023-08-24 DIAGNOSIS — C8841 Extranodal marginal zone b-cell lymphoma of mucosa-associated lymphoid tissue (malt-lymphoma), in remission: Secondary | ICD-10-CM | POA: Insufficient documentation

## 2023-08-24 LAB — CBC WITH DIFFERENTIAL (CANCER CENTER ONLY)
Abs Immature Granulocytes: 0.01 K/uL (ref 0.00–0.07)
Basophils Absolute: 0 K/uL (ref 0.0–0.1)
Basophils Relative: 1 %
Eosinophils Absolute: 0.1 K/uL (ref 0.0–0.5)
Eosinophils Relative: 1 %
HCT: 37.3 % — ABNORMAL LOW (ref 39.0–52.0)
Hemoglobin: 12.3 g/dL — ABNORMAL LOW (ref 13.0–17.0)
Immature Granulocytes: 0 %
Lymphocytes Relative: 22 %
Lymphs Abs: 0.9 K/uL (ref 0.7–4.0)
MCH: 28.7 pg (ref 26.0–34.0)
MCHC: 33 g/dL (ref 30.0–36.0)
MCV: 87.1 fL (ref 80.0–100.0)
Monocytes Absolute: 0.7 K/uL (ref 0.1–1.0)
Monocytes Relative: 17 %
Neutro Abs: 2.3 K/uL (ref 1.7–7.7)
Neutrophils Relative %: 59 %
Platelet Count: 232 K/uL (ref 150–400)
RBC: 4.28 MIL/uL (ref 4.22–5.81)
RDW: 14 % (ref 11.5–15.5)
WBC Count: 4 K/uL (ref 4.0–10.5)
nRBC: 0 % (ref 0.0–0.2)

## 2023-08-24 LAB — CMP (CANCER CENTER ONLY)
ALT: 20 U/L (ref 0–44)
AST: 18 U/L (ref 15–41)
Albumin: 4.4 g/dL (ref 3.5–5.0)
Alkaline Phosphatase: 34 U/L — ABNORMAL LOW (ref 38–126)
Anion gap: 7 (ref 5–15)
BUN: 57 mg/dL — ABNORMAL HIGH (ref 8–23)
CO2: 25 mmol/L (ref 22–32)
Calcium: 9 mg/dL (ref 8.9–10.3)
Chloride: 109 mmol/L (ref 98–111)
Creatinine: 2.34 mg/dL — ABNORMAL HIGH (ref 0.61–1.24)
GFR, Estimated: 29 mL/min — ABNORMAL LOW (ref >60)
Glucose, Bld: 99 mg/dL (ref 70–99)
Potassium: 5.2 mmol/L — ABNORMAL HIGH (ref 3.5–5.1)
Sodium: 141 mmol/L (ref 135–145)
Total Bilirubin: 0.5 mg/dL (ref 0.0–1.2)
Total Protein: 6.8 g/dL (ref 6.5–8.1)

## 2023-08-24 LAB — LACTATE DEHYDROGENASE: LDH: 194 U/L — ABNORMAL HIGH (ref 98–192)

## 2023-08-24 NOTE — Progress Notes (Signed)
 [facility] [phone]   [phone]  PROGRESS NOTE  Patient Care Team: Maree Leni Edyth DELENA, MD as PCP - General (Family Medicine) Mona Vinie BROCKS, MD as PCP - Cardiology (Cardiology) Cindie Ole DASEN, MD as PCP - Electrophysiology (Cardiology) Belinda Cough, MD as Consulting Physician (General Surgery) Dianna Specking, MD as Consulting Physician (Gastroenterology) Debarah Elsie DELENA, MD as Referring Physician (Internal Medicine) Claudean Laneta Mclean, MD as Referring Physician (Hematology and Oncology) Army Dallas NOVAK, MD (Inactive) as Consulting Physician (Cardiothoracic Surgery) Federico Norleen DASEN MADISON, MD as Consulting Physician (Hematology and Oncology)  Hematological/Oncological History # Marginal Zone Lymphoma, In Remission  (a) s/p coil embolization of the feeding (gastro-duodenal) artery 01/23/2015             (b) anemia--scant iron stores on bone marrow biopsy--s/p feraheme  02/13/2015   (1) right inguinal lymph node biopsy 01/22/2015 confirms mantle cell lymphoma             (a) bone marrow biopsy 02/06/2015 positive for involvement by the patient's mantle cell lymphoma             (b) IPI score of 5 (high risk) predicts a 5 year progression free survival of 50% with CHOP-Rituxan  chemotherapy             (c) MIPI score of 5 (intermediate risk) predicts a median survival of 58 months             (d) the cells were CD5 positive, CD19 and 20+, CD23 negative, lambda restricted   (2) CHOP/Rituxan  started 02/09/2015, completed 8 cycles 07/05/2015; total doxorubicin  dose 300 mg/M2   (3)  [facility] consultation 04/06/2015. Patient opted against transplant consolidation   (4) rituximab  maintenance started 08/16/2015, repeat every 2 months, discontinued February 2019             (a) PET scan had shown a suggestion of recurrence, but see (6) below   (5) PSA increase noted July 2017, back to baseline by December 2017             (a) also noted to be high  (13.0) October 2021, resolved with antibiotics   (6) restaging PET scan 11/17/2016 is consistent with disease progression             (a) repeat PET scan 02/02/2017 confirms increase in lymph nodes             (b) PET scan 04/20/2017 shows increasing hypermetabolic adenopathy             (c) bronchoscopy/mediastinoscopy 05/05/2017 shows granulomatous disease, noncaseating, with negative AFB/ GMS stains; <10% of lymphocytes are B-cell with slight lambda excess             (d) PET scan on 07/06/2017 shows mild increase in mediastinal lymphadenopathy. Increased hypermetabolic supraclavicular lymph node.             (e) PET scan 12/24/2017 interpreted as showing evidence of partial response                    (f) PET 09/21/2018 shows decreased size and metabolism of scattered measurable nodes; Deauville 4             (g) PET scan 11/29/2019 essentially unchanged except for increased prostate activity (see #10 below).             (h) thoracic surgery (Dr. Carleton Gaskins) feels it would be very difficult to obtain a biopsy of the small positive areas in  the most recent scan             (i) PET scan 11/13/2020 shows decreased activity in the previously noted subcentimeter Deauville-4 mediastinal and hilar adenopathy              (7) ibrutinib  420 mg daily started 02/09/2017, discontinued March 2019             (a) resumed April 2019 at 560 mg daily             (b) dose back to 420 mg daily as of May 2020  04/09/2021: Transition care to Dr. Federico due to Dr. Tauna retirement.  Patient continuing on ibrutinib  420 mg p.o. daily  Interval History:  [name] 73 y.o. male with medical history significant for mantle cell lymphoma in remission who presents for a follow up visit. The patient's last visit was on 05/18/2023. In the interim since the last visit he has had no major changes in his health.  On exam today [name] reports he has been well overall in the interim since our last visit.  He  reports that he is lost 8 pounds or the weight anything his because his wife is now cooking less since they retired.  He reports that she is an excellent cook and used to cook large meals but they have been scaling back eating smaller meals.  He also reports that he has been exercising at least 4 to 5 days/week and is also mowing all of his children's lawns.  He does enjoy mowing the grass.  He reports that he is tolerating his ibrutinib  therapy well with no bleeding, bruising, or dark stools.  He notes no recent infectious symptoms such as runny nose, sore throat, or cough.  He has no palpitations of the heart or easy bleeding such as nosebleeds, gum bleeding, or bruising.  Overall he feels well and is willing and able to continue on treatment this time.  A full 10 point ROS is otherwise negative.  MEDICAL HISTORY:  Past Medical History:  Diagnosis Date   Anemia    Cancer (HCC)    Lymphoma   Essential hypertension    GERD (gastroesophageal reflux disease)    GI bleed    HLD (hyperlipidemia)    Weakness     SURGICAL HISTORY: Past Surgical History:  Procedure Laterality Date   ESOPHAGOGASTRODUODENOSCOPY Left 01/18/2015   Procedure: ESOPHAGOGASTRODUODENOSCOPY (EGD);  Surgeon: Jerrell Sol, MD;  Location: [facility] ENDOSCOPY;  Service: Endoscopy;  Laterality: Left;   INGUINAL HERNIA REPAIR Right 01/22/2015   Procedure: RIGHT INGUINAL LYMPH NODE BX;  Surgeon: Donnice Lima, MD;  Location: [facility] OR;  Service: General;  Laterality: Right;   IR REMOVAL TUN ACCESS W/ PORT W/O FL MOD SED  01/24/2022   MEDIASTINOSCOPY N/A 05/05/2017   Procedure: MEDIASTINOSCOPY;  Surgeon: Army Dallas NOVAK, MD;  Location: [facility] OR;  Service: Thoracic;  Laterality: N/A;   PORTACATH PLACEMENT  01/26/2015    power port with tip SVC/RA [place]   SKIN SURGERY     Small benign cysts over left scalp removed   VIDEO BRONCHOSCOPY WITH ENDOBRONCHIAL ULTRASOUND N/A 05/05/2017   Procedure: VIDEO BRONCHOSCOPY WITH ENDOBRONCHIAL  ULTRASOUND;  Surgeon: Army Dallas NOVAK, MD;  Location: [facility] OR;  Service: Thoracic;  Laterality: N/A;    SOCIAL HISTORY: Social History   Socioeconomic History   Marital status: Married    Spouse name: Not on file   Number of children: Not on file   Years of education:  Not on file   Highest education level: Bachelor's degree (e.g., BA, AB, BS)  Occupational History   Occupation: Retired  Tobacco Use   Smoking status: Never   Smokeless tobacco: Never  Vaping Use   Vaping status: Never Used  Substance and Sexual Activity   Alcohol use: Never    Alcohol/week: 6.0 standard drinks of alcohol    Types: 3 Cans of beer, 3 Shots of liquor per week   Drug use: No   Sexual activity: Yes    Partners: Female  Other Topics Concern   Not on file  Social History Narrative   Not on file   Social Drivers of Health   Financial Resource Strain: Low Risk  (05/23/2022)   Overall Financial Resource Strain (CARDIA)    Difficulty of Paying Living Expenses: Not hard at all  Food Insecurity: No Food Insecurity (01/05/2023)   Hunger Vital Sign    Worried About Running Out of Food in the Last Year: Never true    Ran Out of Food in the Last Year: Never true  Transportation Needs: No Transportation Needs (01/05/2023)   PRAPARE - Administrator, Civil Service (Medical): No    Lack of Transportation (Non-Medical): No  Physical Activity: Sufficiently Active (05/23/2022)   Exercise Vital Sign    Days of Exercise per Week: 5 days    Minutes of Exercise per Session: 30 min  Stress: No Stress Concern Present (04/02/2021)   [employer] of Occupational Health - Occupational Stress Questionnaire    Feeling of Stress : Not at all  Social Connections: Moderately Isolated (05/23/2022)   Social Connection and Isolation Panel    Frequency of Communication with Friends and Family: More than three times a week    Frequency of Social Gatherings with Friends and Family: Once a week    Attends  Religious Services: Never    Database administrator or Organizations: No    Attends Engineer, structural: Not on file    Marital Status: Married  Catering manager Violence: Not At Risk (01/05/2023)   Humiliation, Afraid, Rape, and Kick questionnaire    Fear of Current or Ex-Partner: No    Emotionally Abused: No    Physically Abused: No    Sexually Abused: No    FAMILY HISTORY: Family History  Problem Relation Age of Onset   Hypertension Mother    Hypertension Father    Kidney failure Father    Hypertension Sister    Diabetes Sister    Lupus Sister    Prostate cancer Brother    Lung cancer Brother    Colon cancer Neg Hx    Stomach cancer Neg Hx     ALLERGIES:  has no known allergies.  MEDICATIONS:  Current Outpatient Medications  Medication Sig Dispense Refill   apixaban  (ELIQUIS ) 5 MG TABS tablet TAKE 1 TABLET BY MOUTH TWICE A DAY 180 tablet 0   atorvastatin  (LIPITOR ) 80 MG tablet Take 1 tablet (80 mg total) by mouth daily. 30 tablet 0   carvedilol  (COREG ) 12.5 MG tablet TAKE 1 TABLET (12.5 MG TOTAL) BY MOUTH 2 (TWO) TIMES DAILY WITH A MEAL. NEEDS FOLLOW UP APPOINTMENT FOR MORE REFILLS 180 tablet 0   folic acid  (FOLVITE ) 1 MG tablet TAKE 1 TABLET BY MOUTH EVERY DAY 90 tablet 2   ibrutinib  (IMBRUVICA ) 420 MG tablet Take 1 tablet (420 mg) by mouth daily. 28 tablet 6   JARDIANCE  10 MG TABS tablet TAKE 1 TABLET BY MOUTH EVERY  DAY BEFORE BREAKFAST 30 tablet 11   sacubitril -valsartan  (ENTRESTO ) 97-103 MG TAKE 1 TABLET BY MOUTH TWICE A DAY 180 tablet 3   spironolactone  (ALDACTONE ) 25 MG tablet Take 1 tablet (25 mg total) by mouth daily. 30 tablet 0   No current facility-administered medications for this visit.    REVIEW OF SYSTEMS:   Constitutional: ( - ) fevers, ( - )  chills , ( - ) night sweats Eyes: ( - ) blurriness of vision, ( - ) double vision, ( - ) watery eyes Ears, nose, mouth, throat, and face: ( - ) mucositis, ( - ) sore throat Respiratory: ( - ) cough, (  - ) dyspnea, ( - ) wheezes Cardiovascular: ( - ) palpitation, ( - ) chest discomfort, ( - ) lower extremity swelling Gastrointestinal:  ( - ) nausea, ( - ) heartburn, ( - ) change in bowel habits Skin: ( - ) abnormal skin rashes Lymphatics: ( - ) new lymphadenopathy, ( - ) easy bruising Neurological: ( - ) numbness, ( - ) tingling, ( - ) new weaknesses Behavioral/Psych: ( - ) mood change, ( - ) new changes  All other systems were reviewed with the patient and are negative.  PHYSICAL EXAMINATION:  Vitals:   08/24/23 0825  BP: 99/72  Pulse: 60  Resp: 16  Temp: 97.6 F (36.4 C)  SpO2: 96%     Filed Weights   08/24/23 0825  Weight: 175 lb 7 oz (79.6 kg)      GENERAL: Well-appearing middle-aged African-American male, alert, no distress and comfortable SKIN: skin color, texture, turgor are normal, no rashes or significant lesions EYES: conjunctiva are pink and non-injected, sclera clear LUNGS: clear to auscultation and percussion with normal breathing effort HEART: regular rate & rhythm and no murmurs and no lower extremity edema Musculoskeletal: no cyanosis of digits and no clubbing. Large mobile, soft mass on mid back most consistent with lipoma.  PSYCH: alert & oriented x 3, fluent speech NEURO: no focal motor/sensory deficits  LABORATORY DATA:  I have reviewed the data as listed    Latest Ref Rng & Units 08/24/2023    7:42 AM 05/18/2023    9:22 AM 02/09/2023    7:43 AM  CBC  WBC 4.0 - 10.5 K/uL 4.0  4.2  4.1   Hemoglobin 13.0 - 17.0 g/dL 87.6  87.0  87.0   Hematocrit 39.0 - 52.0 % 37.3  40.3  39.8   Platelets 150 - 400 K/uL 232  226  222        Latest Ref Rng & Units 08/24/2023    7:42 AM 05/18/2023    9:22 AM 02/09/2023    7:43 AM  CMP  Glucose 70 - 99 mg/dL 99  895  79   BUN 8 - 23 mg/dL 57  22  19   Creatinine 0.61 - 1.24 mg/dL 7.65  8.40  8.55   Sodium 135 - 145 mmol/L 141  139  143   Potassium 3.5 - 5.1 mmol/L 5.2  4.5  4.5   Chloride 98 - 111 mmol/L 109  105   109   CO2 22 - 32 mmol/L 25  29  29    Calcium  8.9 - 10.3 mg/dL 9.0  8.9  9.3   Total Protein 6.5 - 8.1 g/dL 6.8  6.6  6.6   Total Bilirubin 0.0 - 1.2 mg/dL 0.5  0.9  0.7   Alkaline Phos 38 - 126 U/L 34  32  28  AST 15 - 41 U/L 18  22  17    ALT 0 - 44 U/L 20  24  17      RADIOGRAPHIC STUDIES: No results found.   ASSESSMENT & PLAN [name] 73 y.o. male with medical history significant for mantle cell lymphoma in remission who presents for a follow up visit.  #Mantle Cell Lymphoma in Remission -- Prior PET CT scan in October 2022 showed no evidence of residual or recurrent disease, though there were some lymph nodes concerning for sarcoidosis --Patient has been remission since at least 06/11/2015 at which time his PET CT scan showed no evidence of recurrent or residual disease. --CT on 05/04/2023,  no clear evidence of relapsed/recurrent disease. Recommend standard CT scan q 6 months. CT scan due Sept 2025 --Labs from today were reviewed and require no intervention. Labs show white blood cell 4.0, Hgb 12.3, MCV 87.1, Plt 232  --Continue ibrutinib  420 mg p.o. daily --Return to clinic 3 months with labs.   # AKI -- Asked the patient to hold his Entresto  and spironolactone  --Patient also had 8 pound weight drop in the interim since our last visit. -- Recommend that he increase his p.o. fluid intake -- Reached out to his cardiologist regarding next steps -- Continue to monitor   Orders Placed This Encounter  Procedures   CT CHEST ABDOMEN PELVIS W CONTRAST    Standing Status:   Future    Expected Date:   10/19/2023    Expiration Date:   08/23/2024    If indicated for the ordered procedure, I authorize the administration of contrast media per Radiology protocol:   Yes    Does the patient have a contrast media/X-ray dye allergy?:   No    Preferred imaging location?:   [facility]    If indicated for the ordered procedure, I authorize the administration of oral contrast  media per Radiology protocol:   Yes    All questions were answered. The patient knows to call the clinic with any problems, questions or concerns.  I have spent a total of 30 minutes minutes of face-to-face and non-face-to-face time, preparing to see the patient performing a medically appropriate examination, counseling and educating the patient, ordering tests, documenting clinical information in the electronic health record, and care coordination.   Norleen IVAR Kidney, MD Department of Hematology/Oncology [facility] Phone: [phone] Pager: [phone] Email: [email or web address]  08/24/2023 8:57 AM

## 2023-08-25 ENCOUNTER — Telehealth: Payer: Self-pay

## 2023-08-25 DIAGNOSIS — C8319 Mantle cell lymphoma, extranodal and solid organ sites: Secondary | ICD-10-CM

## 2023-08-25 NOTE — Telephone Encounter (Signed)
 Called [name] to let him know that Dr. Federico has spoken with his cardiologist. The cardiologist would like him to restart his Entresto  medication but continue to hold the spironolactone . [name] understands this and will make those medication changes. Scheduled him for labs (CMP) next week to make sure his kidney's are improving. Orders entered. Andrea CHRISTELLA Plunk, RN

## 2023-08-28 ENCOUNTER — Other Ambulatory Visit (HOSPITAL_COMMUNITY): Payer: Self-pay | Admitting: Cardiology

## 2023-09-01 ENCOUNTER — Inpatient Hospital Stay

## 2023-09-01 DIAGNOSIS — C8841 Extranodal marginal zone b-cell lymphoma of mucosa-associated lymphoid tissue (malt-lymphoma), in remission: Secondary | ICD-10-CM | POA: Diagnosis not present

## 2023-09-01 DIAGNOSIS — C8319 Mantle cell lymphoma, extranodal and solid organ sites: Secondary | ICD-10-CM

## 2023-09-01 LAB — CMP (CANCER CENTER ONLY)
ALT: 14 U/L (ref 0–44)
AST: 16 U/L (ref 15–41)
Albumin: 3.9 g/dL (ref 3.5–5.0)
Alkaline Phosphatase: 25 U/L — ABNORMAL LOW (ref 38–126)
Anion gap: 6 (ref 5–15)
BUN: 37 mg/dL — ABNORMAL HIGH (ref 8–23)
CO2: 27 mmol/L (ref 22–32)
Calcium: 8.4 mg/dL — ABNORMAL LOW (ref 8.9–10.3)
Chloride: 105 mmol/L (ref 98–111)
Creatinine: 2.01 mg/dL — ABNORMAL HIGH (ref 0.61–1.24)
GFR, Estimated: 35 mL/min — ABNORMAL LOW (ref >60)
Glucose, Bld: 94 mg/dL (ref 70–99)
Potassium: 4.5 mmol/L (ref 3.5–5.1)
Sodium: 138 mmol/L (ref 135–145)
Total Bilirubin: 0.7 mg/dL (ref 0.0–1.2)
Total Protein: 6.1 g/dL — ABNORMAL LOW (ref 6.5–8.1)

## 2023-09-08 ENCOUNTER — Other Ambulatory Visit: Payer: Self-pay

## 2023-09-10 ENCOUNTER — Other Ambulatory Visit: Payer: Self-pay

## 2023-09-10 NOTE — Progress Notes (Signed)
 Specialty Pharmacy Refill Coordination Note  [name] is a 73 y.o. male contacted today regarding refills of specialty medication(s) Ibrutinib  (IMBRUVICA )   Patient requested Delivery   Delivery date: 09/11/23   Verified address: [address] [place]   Medication will be filled on 09/10/23.

## 2023-09-14 ENCOUNTER — Ambulatory Visit: Payer: Self-pay

## 2023-09-14 NOTE — Telephone Encounter (Signed)
-----   Message from Norleen ONEIDA Kidney IV sent at 09/14/2023  9:40 AM EDT ----- Please let Mr. Vereen know his Creatinine is improving. Please schedule him for another CMP in 2 weeks time.  ----- Message ----- From: Rebecka, Lab [facility] [place] Sent: 09/01/2023   8:44 AM EDT To: Norleen ONEIDA Kidney MADISON, MD

## 2023-09-14 NOTE — Telephone Encounter (Signed)
 Pt advised of lab results with VU.  Lab appt made for 8/15

## 2023-09-18 ENCOUNTER — Other Ambulatory Visit: Payer: Self-pay | Admitting: Cardiology

## 2023-09-18 ENCOUNTER — Inpatient Hospital Stay: Attending: Hematology and Oncology

## 2023-09-18 DIAGNOSIS — C8319 Mantle cell lymphoma, extranodal and solid organ sites: Secondary | ICD-10-CM

## 2023-09-18 DIAGNOSIS — C8841 Extranodal marginal zone b-cell lymphoma of mucosa-associated lymphoid tissue (malt-lymphoma), in remission: Secondary | ICD-10-CM | POA: Insufficient documentation

## 2023-09-18 DIAGNOSIS — I480 Paroxysmal atrial fibrillation: Secondary | ICD-10-CM

## 2023-09-18 LAB — CBC WITH DIFFERENTIAL (CANCER CENTER ONLY)
Abs Immature Granulocytes: 0 K/uL (ref 0.00–0.07)
Basophils Absolute: 0 K/uL (ref 0.0–0.1)
Basophils Relative: 1 %
Eosinophils Absolute: 0 K/uL (ref 0.0–0.5)
Eosinophils Relative: 1 %
HCT: 32.7 % — ABNORMAL LOW (ref 39.0–52.0)
Hemoglobin: 10.5 g/dL — ABNORMAL LOW (ref 13.0–17.0)
Immature Granulocytes: 0 %
Lymphocytes Relative: 29 %
Lymphs Abs: 1 K/uL (ref 0.7–4.0)
MCH: 28.8 pg (ref 26.0–34.0)
MCHC: 32.1 g/dL (ref 30.0–36.0)
MCV: 89.6 fL (ref 80.0–100.0)
Monocytes Absolute: 0.6 K/uL (ref 0.1–1.0)
Monocytes Relative: 18 %
Neutro Abs: 1.8 K/uL (ref 1.7–7.7)
Neutrophils Relative %: 51 %
Platelet Count: 245 K/uL (ref 150–400)
RBC: 3.65 MIL/uL — ABNORMAL LOW (ref 4.22–5.81)
RDW: 14.7 % (ref 11.5–15.5)
WBC Count: 3.4 K/uL — ABNORMAL LOW (ref 4.0–10.5)
nRBC: 0 % (ref 0.0–0.2)

## 2023-09-18 LAB — CMP (CANCER CENTER ONLY)
ALT: 20 U/L (ref 0–44)
AST: 19 U/L (ref 15–41)
Albumin: 4.1 g/dL (ref 3.5–5.0)
Alkaline Phosphatase: 25 U/L — ABNORMAL LOW (ref 38–126)
Anion gap: 7 (ref 5–15)
BUN: 21 mg/dL (ref 8–23)
CO2: 27 mmol/L (ref 22–32)
Calcium: 8.8 mg/dL — ABNORMAL LOW (ref 8.9–10.3)
Chloride: 107 mmol/L (ref 98–111)
Creatinine: 1.54 mg/dL — ABNORMAL HIGH (ref 0.61–1.24)
GFR, Estimated: 48 mL/min — ABNORMAL LOW (ref >60)
Glucose, Bld: 96 mg/dL (ref 70–99)
Potassium: 4.8 mmol/L (ref 3.5–5.1)
Sodium: 141 mmol/L (ref 135–145)
Total Bilirubin: 0.9 mg/dL (ref 0.0–1.2)
Total Protein: 6 g/dL — ABNORMAL LOW (ref 6.5–8.1)

## 2023-09-18 LAB — LACTATE DEHYDROGENASE: LDH: 193 U/L — ABNORMAL HIGH (ref 98–192)

## 2023-09-21 ENCOUNTER — Telehealth: Payer: Self-pay | Admitting: *Deleted

## 2023-09-21 NOTE — Telephone Encounter (Signed)
 Ms. Drees called about her husband's recent labs. She is concerned about results from 8/15 as many are noted as abnormal or out of range. She said the next lab is not until October and asked if Dr. Federico would please schedule labs in a few weeks to see if the labs from 8/15 improve.  Advised her that Dr. Federico will receive this message. Message routed to Dr. Federico

## 2023-09-22 NOTE — Telephone Encounter (Signed)
 Per Dr. Federico - OK to repeat labs in 2 weeks. Contacted patient's wife to inform her and to let her know that scheduling will contact to make lab appt. She verbalized understanding.

## 2023-09-25 ENCOUNTER — Telehealth: Payer: Self-pay | Admitting: Hematology and Oncology

## 2023-10-01 ENCOUNTER — Other Ambulatory Visit: Payer: Self-pay

## 2023-10-05 ENCOUNTER — Other Ambulatory Visit: Payer: Self-pay | Admitting: Hematology and Oncology

## 2023-10-05 DIAGNOSIS — C8319 Mantle cell lymphoma, extranodal and solid organ sites: Secondary | ICD-10-CM

## 2023-10-06 ENCOUNTER — Inpatient Hospital Stay: Attending: Hematology and Oncology

## 2023-10-06 ENCOUNTER — Other Ambulatory Visit: Payer: Self-pay

## 2023-10-06 ENCOUNTER — Other Ambulatory Visit (HOSPITAL_COMMUNITY): Payer: Self-pay

## 2023-10-06 DIAGNOSIS — Z8673 Personal history of transient ischemic attack (TIA), and cerebral infarction without residual deficits: Secondary | ICD-10-CM | POA: Diagnosis not present

## 2023-10-06 DIAGNOSIS — Z8639 Personal history of other endocrine, nutritional and metabolic disease: Secondary | ICD-10-CM | POA: Insufficient documentation

## 2023-10-06 DIAGNOSIS — C8841 Extranodal marginal zone b-cell lymphoma of mucosa-associated lymphoid tissue (malt-lymphoma), in remission: Secondary | ICD-10-CM | POA: Insufficient documentation

## 2023-10-06 DIAGNOSIS — C8319 Mantle cell lymphoma, extranodal and solid organ sites: Secondary | ICD-10-CM

## 2023-10-06 LAB — CBC WITH DIFFERENTIAL (CANCER CENTER ONLY)
Abs Immature Granulocytes: 0.02 K/uL (ref 0.00–0.07)
Basophils Absolute: 0 K/uL (ref 0.0–0.1)
Basophils Relative: 1 %
Eosinophils Absolute: 0 K/uL (ref 0.0–0.5)
Eosinophils Relative: 1 %
HCT: 36.8 % — ABNORMAL LOW (ref 39.0–52.0)
Hemoglobin: 11.8 g/dL — ABNORMAL LOW (ref 13.0–17.0)
Immature Granulocytes: 1 %
Lymphocytes Relative: 26 %
Lymphs Abs: 0.9 K/uL (ref 0.7–4.0)
MCH: 28.6 pg (ref 26.0–34.0)
MCHC: 32.1 g/dL (ref 30.0–36.0)
MCV: 89.3 fL (ref 80.0–100.0)
Monocytes Absolute: 0.5 K/uL (ref 0.1–1.0)
Monocytes Relative: 16 %
Neutro Abs: 1.9 K/uL (ref 1.7–7.7)
Neutrophils Relative %: 55 %
Platelet Count: 265 K/uL (ref 150–400)
RBC: 4.12 MIL/uL — ABNORMAL LOW (ref 4.22–5.81)
RDW: 14.2 % (ref 11.5–15.5)
WBC Count: 3.4 K/uL — ABNORMAL LOW (ref 4.0–10.5)
nRBC: 0 % (ref 0.0–0.2)

## 2023-10-06 LAB — CMP (CANCER CENTER ONLY)
ALT: 16 U/L (ref 0–44)
AST: 18 U/L (ref 15–41)
Albumin: 4.2 g/dL (ref 3.5–5.0)
Alkaline Phosphatase: 26 U/L — ABNORMAL LOW (ref 38–126)
Anion gap: 6 (ref 5–15)
BUN: 20 mg/dL (ref 8–23)
CO2: 28 mmol/L (ref 22–32)
Calcium: 8.9 mg/dL (ref 8.9–10.3)
Chloride: 108 mmol/L (ref 98–111)
Creatinine: 1.34 mg/dL — ABNORMAL HIGH (ref 0.61–1.24)
GFR, Estimated: 57 mL/min — ABNORMAL LOW (ref >60)
Glucose, Bld: 85 mg/dL (ref 70–99)
Potassium: 4.2 mmol/L (ref 3.5–5.1)
Sodium: 142 mmol/L (ref 135–145)
Total Bilirubin: 0.9 mg/dL (ref 0.0–1.2)
Total Protein: 6.4 g/dL — ABNORMAL LOW (ref 6.5–8.1)

## 2023-10-06 LAB — LACTATE DEHYDROGENASE: LDH: 189 U/L (ref 98–192)

## 2023-10-06 LAB — VITAMIN B12: Vitamin B-12: 415 pg/mL (ref 180–914)

## 2023-10-06 LAB — IRON AND IRON BINDING CAPACITY (CC-WL,HP ONLY)
Iron: 92 ug/dL (ref 45–182)
Saturation Ratios: 25 % (ref 17.9–39.5)
TIBC: 375 ug/dL (ref 250–450)
UIBC: 283 ug/dL (ref 117–376)

## 2023-10-06 LAB — FOLATE: Folate: >20 ng/mL (ref >5.9)

## 2023-10-06 LAB — RETIC PANEL
Immature Retic Fract: 23.7 % — ABNORMAL HIGH (ref 2.3–15.9)
RBC.: 4.16 MIL/uL — ABNORMAL LOW (ref 4.22–5.81)
Retic Count, Absolute: 85.7 K/uL (ref 19.0–186.0)
Retic Ct Pct: 2.1 % (ref 0.4–3.1)
Reticulocyte Hemoglobin: 31.4 pg (ref >27.9)

## 2023-10-06 LAB — FERRITIN: Ferritin: 33 ng/mL (ref 24–336)

## 2023-10-06 NOTE — Progress Notes (Signed)
 Specialty Pharmacy Refill Coordination Note  Spoke with [name] is a 73 y.o. male contacted today regarding refills of specialty medication(s) Ibrutinib  (IMBRUVICA )  Doses on hand: 10  Patient requested: Delivery   Delivery date: 10/09/23   Verified address: [address] [name] [address]  Medication will be filled on 10/08/23.

## 2023-10-07 ENCOUNTER — Other Ambulatory Visit: Payer: Self-pay

## 2023-10-08 ENCOUNTER — Other Ambulatory Visit: Payer: Self-pay

## 2023-10-09 LAB — METHYLMALONIC ACID, SERUM: Methylmalonic Acid, Quantitative: 158 nmol/L (ref 0–378)

## 2023-10-18 ENCOUNTER — Other Ambulatory Visit: Payer: Self-pay | Admitting: Hematology and Oncology

## 2023-10-19 ENCOUNTER — Ambulatory Visit (HOSPITAL_COMMUNITY)
Admission: RE | Admit: 2023-10-19 | Discharge: 2023-10-19 | Disposition: A | Discharge status: Home / Self Care | Source: Ambulatory Visit | Attending: Hematology and Oncology | Admitting: Hematology and Oncology

## 2023-10-19 ENCOUNTER — Telehealth: Payer: Self-pay

## 2023-10-19 DIAGNOSIS — C8319 Mantle cell lymphoma, extranodal and solid organ sites: Secondary | ICD-10-CM | POA: Insufficient documentation

## 2023-10-19 MED ORDER — IOHEXOL 9 MG/ML PO SOLN
ORAL | Status: AC
  Filled 2023-10-19: qty 1000

## 2023-10-19 MED ORDER — IOHEXOL 9 MG/ML PO SOLN
500.0000 mL | ORAL | Status: AC
  Administered 2023-10-19 (×2): 500 mL via ORAL

## 2023-10-19 MED ORDER — IOHEXOL 300 MG/ML  SOLN
100.0000 mL | Freq: Once | INTRAMUSCULAR | Status: AC | PRN
Start: 2023-10-19 — End: 2023-10-19
  Administered 2023-10-19: 100 mL via INTRAVENOUS

## 2023-10-19 NOTE — Telephone Encounter (Signed)
 Copied from CRM #[id number]. Topic: General - Other >> Oct 19, 2023 11:07 AM Wess RAMAN wrote: Reason for CRM: Patient would like to receive the covid vaccine for seniors 21 and older and need prescription sent to pharmacy.  Callback #: [id number]  Pharmacy: [id number] - [place], [place] - [address] AT [facility] OF [address]  [place] [address] Phone: [phone] Fax: [phone] Hours: Open 24 hours

## 2023-10-22 ENCOUNTER — Telehealth: Payer: Self-pay | Admitting: Physician Assistant

## 2023-10-22 DIAGNOSIS — R911 Solitary pulmonary nodule: Secondary | ICD-10-CM

## 2023-10-22 NOTE — Telephone Encounter (Signed)
 I called [name] to review the CT scan results from 10/19/2023. 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 Patients on ibrutinib  are more susceptible to these kinds of infections so recommend referral to infectious disease for further evaluation. [name] expressed understanding of the plan provided.

## 2023-10-28 ENCOUNTER — Other Ambulatory Visit: Payer: Self-pay

## 2023-10-28 ENCOUNTER — Other Ambulatory Visit: Payer: Self-pay | Admitting: Hematology and Oncology

## 2023-10-29 ENCOUNTER — Encounter: Payer: Self-pay | Admitting: Oncology

## 2023-10-29 ENCOUNTER — Other Ambulatory Visit: Payer: Self-pay

## 2023-10-29 MED ORDER — IBRUTINIB 420 MG PO TABS
420.0000 mg | ORAL_TABLET | Freq: Every day | ORAL | 6 refills | Status: AC
  Filled 2023-10-29 – 2023-11-02 (×2): qty 28, 28d supply, fill #0
  Filled 2023-11-26 – 2023-11-30 (×2): qty 28, 28d supply, fill #1
  Filled 2023-12-23: qty 28, 28d supply, fill #2
  Filled 2024-01-18: qty 28, 28d supply, fill #3
  Filled 2024-02-16: qty 28, 28d supply, fill #4

## 2023-11-02 ENCOUNTER — Other Ambulatory Visit: Payer: Self-pay

## 2023-11-02 NOTE — Progress Notes (Unsigned)
 [facility] [address] [place]. [place] [address] [phone]       OFFICE  FOLLOW UP NOTE  [name] Date of Birth:  1950/05/29 Medical Record Number:  [id number]     Primary neurologist: Dr. Rosemarie Reason for visit: Memory loss complaints   No chief complaint on file.     HPI:   Update 11/03/2023 JM: Patient returns for yearly follow-up visit.       History provided for reference purposes only Update 11/03/2022 JM: Patient returns for follow-up visit unaccompanied.  Reports overall stable since prior visit.  Denies any worsening of memory. More difficulty with short term memory and with remembering names, usually will come back to him eventually.  Continues to maintain ADLs and IADLs independently.  Still drives, does have some difficulty with directions but denies being lost. Still doing yard work, enjoys mowing yards. Routinely works out with light weights 5x per week at his home. Sleeping well. Good appetite. MRI brain showed old strokes and small vessel disease but no new or acute findings. EEG negative.  Dementia panel shows satisfactory B12, thyroid  and syphilis but did show elevated homocystine, started on folic acid  1 mg daily which he is still taking. Denies new stroke/TIA symptoms.  Routinely follows with PCP and cardiology for stroke risk factor management.  Continues to follow with oncology for hx of mantle cell lymphoma in remission since 2017, has repeat scan next month for surveillance monitoring.  Consult visit 07/01/2022 Dr. Rosemarie: [name] is a pleasant 73 year old African-American male seen today for consultation visit for memory loss.  He is accompanied by his wife.  History is obtained from them and review of electronic medical records.  I personally reviewed pertinent available imaging films in PACS.  He has past medical history of nonobstructive coronary artery disease, chronic systolic heart failure, paroxysmal A-fib,  asymptomatic left carotid stenosis, ascending aortic aneurysm, hypertension, hyperlipidemia, right pontine infarct in June 2021, mantle cell lymphoma controlled on chemotherapy.  Patient states for the last 1 year has noticed memory difficulties.  Wife has noticed that he is at times forgets people's names remember it later.  He also has trouble remembering directions and often forgets so he is Back to driving.  He is otherwise independent in activities of daily living.  He can feed himself, dress himself use the restroom.  In fact works out in the yard.  Patient denies significant headaches, seizures, loss of consciousness or any recent stroke or TIA episodes.  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  He is asymptomatic moderate left carotid stenosis which has remained stable over the years and last carotid ultrasound on 05/01/2022 showed 60 to 79% left ICA stenosis.  Lab work on 02/23/2022 showed LDL cholesterol to be 75 mg percent.  Hemoglobin A1c on 04/09/2021 was 5.6.  Patient remains on Eliquis  which is tolerating well with only minor bruising and no bleeding.  States his tolerating Lipitor  well without side effects.  He does follow-up with his cardiologist regularly for his congestive heart failure and last echo on 04/16/2021 had shown improved ejection fraction to 45 to 50%.  He has no family history of Alzheimer's dementia.  He has had no recent brain imaging studies done or any lab work for reversible causes checked.    ROS:   14 system review of systems is positive for those listed in HPI and all other systems negative  PMH:  Past  Medical History:  Diagnosis Date   Anemia    Cancer (HCC)    Lymphoma   Essential hypertension    GERD (gastroesophageal reflux disease)    GI bleed    HLD (hyperlipidemia)    Weakness     Social History:  Social History   Socioeconomic History   Marital status: Married    Spouse name: Not on file   Number of  children: Not on file   Years of education: Not on file   Highest education level: Bachelor's degree (e.g., BA, AB, BS)  Occupational History   Occupation: Retired  Tobacco Use   Smoking status: Never   Smokeless tobacco: Never  Vaping Use   Vaping status: Never Used  Substance and Sexual Activity   Alcohol use: Never    Alcohol/week: 6.0 standard drinks of alcohol    Types: 3 Cans of beer, 3 Shots of liquor per week   Drug use: No   Sexual activity: Yes    Partners: Female  Other Topics Concern   Not on file  Social History Narrative   Not on file   Social Drivers of Health   Financial Resource Strain: Low Risk  (05/23/2022)   Overall Financial Resource Strain (CARDIA)    Difficulty of Paying Living Expenses: Not hard at all  Food Insecurity: No Food Insecurity (01/05/2023)   Hunger Vital Sign    Worried About Running Out of Food in the Last Year: Never true    Ran Out of Food in the Last Year: Never true  Transportation Needs: No Transportation Needs (01/05/2023)   PRAPARE - Administrator, Civil Service (Medical): No    Lack of Transportation (Non-Medical): No  Physical Activity: Sufficiently Active (05/23/2022)   Exercise Vital Sign    Days of Exercise per Week: 5 days    Minutes of Exercise per Session: 30 min  Stress: No Stress Concern Present (04/02/2021)   [employer] of Occupational Health - Occupational Stress Questionnaire    Feeling of Stress : Not at all  Social Connections: Moderately Isolated (05/23/2022)   Social Connection and Isolation Panel    Frequency of Communication with Friends and Family: More than three times a week    Frequency of Social Gatherings with Friends and Family: Once a week    Attends Religious Services: Never    Database administrator or Organizations: No    Attends Engineer, structural: Not on file    Marital Status: Married  Catering manager Violence: Not At Risk (01/05/2023)   Humiliation, Afraid, Rape,  and Kick questionnaire    Fear of Current or Ex-Partner: No    Emotionally Abused: No    Physically Abused: No    Sexually Abused: No    Medications:   Current Outpatient Medications on File Prior to Visit  Medication Sig Dispense Refill   atorvastatin  (LIPITOR ) 80 MG tablet Take 1 tablet (80 mg total) by mouth daily. 30 tablet 0   carvedilol  (COREG ) 12.5 MG tablet TAKE 1 TABLET (12.5 MG TOTAL) BY MOUTH 2 (TWO) TIMES DAILY WITH A MEAL. NEEDS FOLLOW UP APPOINTMENT FOR MORE REFILLS 180 tablet 0   ELIQUIS  5 MG TABS tablet TAKE 1 TABLET BY MOUTH TWICE A DAY 180 tablet 0   folic acid  (FOLVITE ) 1 MG tablet TAKE 1 TABLET BY MOUTH EVERY DAY 90 tablet 2   ibrutinib  (IMBRUVICA ) 420 MG tablet Take 1 tablet (420 mg) by mouth daily. 28 tablet 6   JARDIANCE   10 MG TABS tablet TAKE 1 TABLET BY MOUTH EVERY DAY BEFORE BREAKFAST 30 tablet 11   sacubitril -valsartan  (ENTRESTO ) 97-103 MG TAKE 1 TABLET BY MOUTH TWICE A DAY 180 tablet 3   spironolactone  (ALDACTONE ) 25 MG tablet TAKE 1 TABLET (25 MG TOTAL) BY MOUTH DAILY. NEEDS FOLLOW UP APPOINTMENT FOR MORE REFILLS 90 tablet 0   No current facility-administered medications on file prior to visit.    Allergies:  No Known Allergies  Physical Exam There were no vitals filed for this visit.  There is no height or weight on file to calculate BMI.   General: well developed, well nourished very pleasant middle-aged African-American male, seated, in no evident distress Head: head normocephalic and atraumatic.   Neck: supple with no carotid or supraclavicular bruits Cardiovascular: regular rate and rhythm, no murmurs Musculoskeletal: no deformity Skin:  no rash/petichiae Vascular:  Normal pulses all extremities  Neurologic Exam Mental Status: Awake and fully alert.  Fluent speech and language.  Oriented to place and time. Recent and remote memory intact. Attention span, concentration and fund of knowledge appropriate. Mood and affect appropriate.   Cranial  Nerves: Pupils equal, briskly reactive to light. Extraocular movements full without nystagmus. Visual fields full to confrontation. Hearing intact. Facial sensation intact. Face, tongue, palate moves normally and symmetrically.  Motor: Normal bulk and tone. Normal strength in all tested extremity muscles. Sensory.: intact to touch , pinprick , position and vibratory sensation.  Coordination: Rapid alternating movements normal in all extremities. Finger-to-nose and heel-to-shin performed accurately bilaterally. Gait and Station: Arises from chair without difficulty. Stance is normal. Gait demonstrates normal stride length and balance . Able to heel, toe and tandem walk without difficulty.  Reflexes: 1+ and symmetric. Toes downgoing.      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  Remote history of right pontine stroke in June 2021.  Vascular risk factors paroxysmal A-fib, heart failure, and moderate asymptomatic left carotid stenosis.     PLAN:  Mild cognitive impairment -Reported mild cognitive impairment for many years in 2021 -MMSE today 29/30 (prior 30/30) -if cognition starts to progress, could consider trial of cholinesterase inhibitors and/or neurocognitive evaluation -Discussed importance of continued routine physical and cognitive exercises as well as managing stroke risk factors, ensuring good sleep and healthy diet -Dementia panel showed slightly  elevated homocystine, continue folic acid  1 mg daily, repeat level today -MRI brain 07/2022 chronic infarcts, chronic small vessel disease, no new findings since 2021 imaging -EEG 07/2022 normal   History of stroke -Continue Eliquis  and atorvastatin  for secondary stroke prevention measures managed/prescribed by PCP/cardiology -Continue to follow with cardiology and PCP for aggressive stroke risk factor management including BP goal<130/90 and HLD with LDL goal<70   Left carotid stenosis -Followed by cardiology Dr. Rolan, previously planned on repeating this month but may not be needed now as completed 6  months ago - advised to contact their office to schedule f/u visit -Carotid ultrasound 04/2022 left ICA 60 to 79% stenosis -Carotid ultrasound 11/2021 60 to 79% stenosis    Follow-up in 1 year or call earlier if needed, if remains stable, can f/u as needed    I personally spent a total of *** minutes in the care of the patient today including {Time Based Coding:[id number]}.   Harlene Bogaert, AGNP-BC  [facility] [address] [place], [place] [address]  Phone [phone] Fax [phone] Note: This document was prepared with digital dictation and possible smart phrase technology. Any transcriptional errors that result from this process are unintentional.

## 2023-11-03 ENCOUNTER — Ambulatory Visit: Payer: Medicare PPO | Admitting: Adult Health

## 2023-11-03 ENCOUNTER — Encounter: Payer: Self-pay | Admitting: Adult Health

## 2023-11-03 VITALS — BP 114/80 | HR 60 | Ht 70.0 in | Wt 182.6 lb

## 2023-11-03 DIAGNOSIS — Z8673 Personal history of transient ischemic attack (TIA), and cerebral infarction without residual deficits: Secondary | ICD-10-CM | POA: Diagnosis not present

## 2023-11-03 DIAGNOSIS — G3184 Mild cognitive impairment, so stated: Secondary | ICD-10-CM | POA: Diagnosis not present

## 2023-11-03 DIAGNOSIS — R7983 Abnormal findings of blood amino-acid level: Secondary | ICD-10-CM

## 2023-11-03 NOTE — Patient Instructions (Addendum)
 Your Plan:  Ensure routine physical and cognitive exercises as well as good sleep, healthy diet and close follow-up with PCP and cardiologist for vascular risk factor management  Continue to follow with cardiology as scheduled  Ensure routine follow-up with your PCP for general health monitoring     Follow-up in 1 year or call earlier if needed      Thank you for coming to see us  at [facility]. I hope we have been able to provide you high quality care today.  You may receive a patient satisfaction survey over the next few weeks. We would appreciate your feedback and comments so that we may continue to improve ourselves and the health of our patients.

## 2023-11-04 ENCOUNTER — Other Ambulatory Visit (HOSPITAL_COMMUNITY): Payer: Self-pay

## 2023-11-04 ENCOUNTER — Other Ambulatory Visit: Payer: Self-pay

## 2023-11-04 NOTE — Progress Notes (Signed)
 Specialty Pharmacy Refill Coordination Note  [name] is a 73 y.o. male contacted today regarding refills of specialty medication(s) Ibrutinib  (IMBRUVICA )   Patient requested Delivery   Delivery date: 11/06/23   Verified address: [address] [place] [address]   Medication will be filled on 11/05/23.

## 2023-11-05 ENCOUNTER — Other Ambulatory Visit (HOSPITAL_COMMUNITY): Payer: Self-pay | Admitting: Cardiology

## 2023-11-11 ENCOUNTER — Other Ambulatory Visit: Payer: Self-pay

## 2023-11-11 NOTE — Progress Notes (Signed)
 Specialty Pharmacy Ongoing Clinical Assessment Note  [name] is a 73 y.o. male who is being followed by the specialty pharmacy service for RxSp Oncology   Patient's specialty medication(s) reviewed today: Ibrutinib  (IMBRUVICA )   Missed doses in the last 4 weeks: 0   Patient/Caregiver did not have any additional questions or concerns.   Therapeutic benefit summary: Patient is achieving benefit   Adverse events/side effects summary: No adverse events/side effects   Patient's therapy is appropriate to: Continue    Goals Addressed             This Visit's Progress    Achieve or maintain remission   On track    Patient is on track. Patient will maintain adherence. CT from 10/19/23 shows no clear evidence of recurrence.          Follow up: 6 months  [facility]

## 2023-11-17 ENCOUNTER — Telehealth (HOSPITAL_COMMUNITY): Payer: Self-pay

## 2023-11-17 NOTE — Telephone Encounter (Signed)
 Called to confirm/remind patient of their appointment at the [facility] on 11/18/23 2:30.   Appointment:   [x] Confirmed  [] Left mess   [] No answer/No voice mail  [] VM Full/unable to leave message  [] Phone not in service  Patient reminded to bring all medications and/or complete list.  Confirmed patient has transportation. Gave directions, instructed to utilize valet parking.

## 2023-11-18 ENCOUNTER — Encounter (HOSPITAL_COMMUNITY): Payer: Self-pay

## 2023-11-18 ENCOUNTER — Ambulatory Visit (HOSPITAL_COMMUNITY)
Admission: RE | Admit: 2023-11-18 | Discharge: 2023-11-18 | Disposition: A | Discharge status: Home / Self Care | Source: Ambulatory Visit | Attending: Cardiology | Admitting: Cardiology

## 2023-11-18 VITALS — BP 130/86 | HR 55 | Ht 70.0 in | Wt 181.1 lb

## 2023-11-18 DIAGNOSIS — E785 Hyperlipidemia, unspecified: Secondary | ICD-10-CM | POA: Diagnosis not present

## 2023-11-18 DIAGNOSIS — Z8249 Family history of ischemic heart disease and other diseases of the circulatory system: Secondary | ICD-10-CM | POA: Insufficient documentation

## 2023-11-18 DIAGNOSIS — Z7984 Long term (current) use of oral hypoglycemic drugs: Secondary | ICD-10-CM | POA: Diagnosis not present

## 2023-11-18 DIAGNOSIS — I6529 Occlusion and stenosis of unspecified carotid artery: Secondary | ICD-10-CM | POA: Diagnosis not present

## 2023-11-18 DIAGNOSIS — I340 Nonrheumatic mitral (valve) insufficiency: Secondary | ICD-10-CM | POA: Insufficient documentation

## 2023-11-18 DIAGNOSIS — Z79899 Other long term (current) drug therapy: Secondary | ICD-10-CM | POA: Insufficient documentation

## 2023-11-18 DIAGNOSIS — Z8572 Personal history of non-Hodgkin lymphomas: Secondary | ICD-10-CM | POA: Insufficient documentation

## 2023-11-18 DIAGNOSIS — Q231 Congenital insufficiency of aortic valve: Secondary | ICD-10-CM | POA: Insufficient documentation

## 2023-11-18 DIAGNOSIS — I5042 Chronic combined systolic (congestive) and diastolic (congestive) heart failure: Secondary | ICD-10-CM | POA: Insufficient documentation

## 2023-11-18 DIAGNOSIS — I428 Other cardiomyopathies: Secondary | ICD-10-CM | POA: Diagnosis not present

## 2023-11-18 DIAGNOSIS — I4892 Unspecified atrial flutter: Secondary | ICD-10-CM | POA: Insufficient documentation

## 2023-11-18 DIAGNOSIS — I480 Paroxysmal atrial fibrillation: Secondary | ICD-10-CM | POA: Diagnosis not present

## 2023-11-18 DIAGNOSIS — Q2381 Bicuspid aortic valve: Secondary | ICD-10-CM | POA: Insufficient documentation

## 2023-11-18 DIAGNOSIS — Z8673 Personal history of transient ischemic attack (TIA), and cerebral infarction without residual deficits: Secondary | ICD-10-CM | POA: Insufficient documentation

## 2023-11-18 DIAGNOSIS — I7121 Aneurysm of the ascending aorta, without rupture: Secondary | ICD-10-CM | POA: Insufficient documentation

## 2023-11-18 DIAGNOSIS — I2510 Atherosclerotic heart disease of native coronary artery without angina pectoris: Secondary | ICD-10-CM | POA: Insufficient documentation

## 2023-11-18 DIAGNOSIS — I5020 Unspecified systolic (congestive) heart failure: Secondary | ICD-10-CM

## 2023-11-18 DIAGNOSIS — Z7901 Long term (current) use of anticoagulants: Secondary | ICD-10-CM | POA: Diagnosis not present

## 2023-11-18 DIAGNOSIS — I6522 Occlusion and stenosis of left carotid artery: Secondary | ICD-10-CM

## 2023-11-18 MED ORDER — CARVEDILOL 12.5 MG PO TABS: 12.5000 mg | ORAL_TABLET | Freq: Two times a day (BID) | ORAL | 11 refills | Status: AC

## 2023-11-18 NOTE — Progress Notes (Signed)
 [facility] NOTE  PCP: Maree Leni Edyth DELENA, MD Cardiology: Dr. Mona HF Cardiology: Dr. Rolan  HPI: 73 y.o. with history of mantle cell lymphoma, CVA, PACs, and chronic systolic CHF was referred by Dr. Mona.  Patient was diagnosed with mantle cell lymphoma in 2017 and started CHOP-R in 6/17.  He is currently on ibrutinib  in remission.  In 6/21, he was admitted with CVA.  Imaging showed acute right pontine infarct but also chronic right mid frontal lobe infarct and superior left cerebellar infarct.  He wore a Zio patch for 2 wks, showing frequent PACs but no AF.   During the 6/21 hospitalization, echo showed EF 35-40%.  Coronary CT angiogram in 8/21 showed mild nonobstructive disease.  Echo in 5/22 showed further deterioration in LV function with EF 25-30%, moderate LV dilation, and normal RV.    CMR in 9/22 showed LV EF 43%, RV EF 48%, small areas of mid-wall LGE in the basal inferior and basal inferoseptal walls.   Atrial flutter has been noted, he is now on Eliquis .   Echo 3/23 showed EF 45-50%, mild global hypokinesis, normal RV, normal IVC, bicuspid aortic valve with mild AI.   He was last seen in [facility] 3/24, he has continued to follow regularly with Dr. Mona. Per notes, he is an avid exercizer with NYHA class I symptoms. . Most recent echo 5/25 showed improved EF 50-55% and nl RV function with myxomatous MR with mod/sev MR.   He returns today for heart failure follow up. Overall feeling great. He works out regularly. Would like to start running some again. No HF symptoms. NYHA I. Does have occasional dizziness when he bends down and comes back up. Appetite okay. Compliant with all medications.  PMH: 1. Non-hodgkin's lymphoma: Mantle cell lymphoma.  He was treated with CHOP-R starting 6/17.  Currently on ibrutinib .  2. Carotid stenosis: Carotid dopplers (6/21) with 60-79% LICA stenosis.  - Carotid dopplers (8/22): 60-79% LICA.  3. CVA (6/21): Acute right pontine infarct but also  with chronic right mid frontal lobe infarct and superior left cerebellar infarct.  Carotid stenosis was not thought to be the etiology.   - 2 wk Zio patch in 7/21 showed frequent PACs but no atrial fibrillation.  4. PACs: Frequent.  5. Cardiomyopathy: Nonischemic.  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  - Echo (6/21): EF 35-40% - Coronary CT angiography (8/21): mild nonobstructive CAD, ascending aorta 4.3 cm - Echo (5/22): EF 25-30%, moderate LV dilation, mild AI, normal RV.  - cMRI (9/22): LV EF 43%, RV EF 48%, small areas of mid-wall LGE in the basal inferior and basal inferoseptal walls. - Echo (3/23): EF 45-50%, mild global hypokinesis, normal RV, normal IVC, bicuspid aortic valve with mild AI.  6. 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  7. Duodenal ulcer with GI bleeding 8. Atrial flutter: Paroxysmal.  9. Functionally bicuspid aortic valve: Mild AI on 3/23 echo.   Social History   Socioeconomic History   Marital status: Married    Spouse name: Not on file   Number of children: Not on file   Years of education: Not on file   Highest education level: Bachelor's degree (e.g., BA, AB, BS)  Occupational History   Occupation: Retired  Tobacco Use   Smoking status: Never   Smokeless tobacco: Never  Vaping Use   Vaping status: Never Used  Substance and Sexual Activity   Alcohol use: Never    Alcohol/week: 6.0 standard drinks of alcohol  Types: 3 Cans of beer, 3 Shots of liquor per week   Drug use: No   Sexual activity: Yes    Partners: Female  Other Topics Concern   Not on file  Social History Narrative   Not on file   Social Drivers of Health   Financial Resource Strain: Low Risk  (05/23/2022)   Overall Financial Resource Strain (CARDIA)    Difficulty of Paying Living Expenses: Not hard at all  Food Insecurity: No Food Insecurity (01/05/2023)   Hunger Vital Sign    Worried About Running Out of Food in the Last Year: Never true    Ran Out of Food in the Last  Year: Never true  Transportation Needs: No Transportation Needs (01/05/2023)   PRAPARE - Administrator, Civil Service (Medical): No    Lack of Transportation (Non-Medical): No  Physical Activity: Sufficiently Active (05/23/2022)   Exercise Vital Sign    Days of Exercise per Week: 5 days    Minutes of Exercise per Session: 30 min  Stress: No Stress Concern Present (04/02/2021)   [employer] of Occupational Health - Occupational Stress Questionnaire    Feeling of Stress : Not at all  Social Connections: Moderately Isolated (05/23/2022)   Social Connection and Isolation Panel    Frequency of Communication with Friends and Family: More than three times a week    Frequency of Social Gatherings with Friends and Family: Once a week    Attends Religious Services: Never    Database administrator or Organizations: No    Attends Engineer, structural: Not on file    Marital Status: Married  Catering manager Violence: Not At Risk (01/05/2023)   Humiliation, Afraid, Rape, and Kick questionnaire    Fear of Current or Ex-Partner: No    Emotionally Abused: No    Physically Abused: No    Sexually Abused: No   Family History  Problem Relation Age of Onset   Hypertension Mother    Hypertension Father    Kidney failure Father    Hypertension Sister    Diabetes Sister    Lupus Sister    Prostate cancer Brother    Lung cancer Brother    Colon cancer Neg Hx    Stomach cancer Neg Hx    ROS: All systems reviewed and negative except as per HPI.  Current Outpatient Medications  Medication Sig Dispense Refill   atorvastatin  (LIPITOR ) 80 MG tablet Take 1 tablet (80 mg total) by mouth daily. 30 tablet 0   ELIQUIS  5 MG TABS tablet TAKE 1 TABLET BY MOUTH TWICE A DAY 180 tablet 0   folic acid  (FOLVITE ) 1 MG tablet TAKE 1 TABLET BY MOUTH EVERY DAY 90 tablet 2   ibrutinib  (IMBRUVICA ) 420 MG tablet Take 1 tablet (420 mg) by mouth daily. 28 tablet 6   JARDIANCE  10 MG TABS tablet TAKE  1 TABLET BY MOUTH EVERY DAY BEFORE BREAKFAST 30 tablet 11   sacubitril -valsartan  (ENTRESTO ) 97-103 MG TAKE 1 TABLET BY MOUTH TWICE A DAY 180 tablet 3   carvedilol  (COREG ) 12.5 MG tablet Take 1 tablet (12.5 mg total) by mouth 2 (two) times daily with a meal. 60 tablet 11   No current facility-administered medications for this encounter.   Wt Readings from Last 3 Encounters:  11/18/23 82.2 kg (181 lb 2 oz)  11/03/23 82.8 kg (182 lb 9.6 oz)  08/24/23 79.6 kg (175 lb 7 oz)   BP 130/86   Pulse (!) 55  Ht 5' 10 (1.778 m)   Wt 82.2 kg (181 lb 2 oz)   SpO2 99%   BMI 25.99 kg/m   Physical Exam: General: Well appearing. No distress on RA Cardiac: JVP flat. S1 and S2 present. 2/6 systolic murmur at apex Extremities: Warm and dry.  No peripheral edema.  Neuro: Alert and oriented x3. Affect pleasant. Moves all extremities without difficulty.  Assessment/Plan: 1. Chronic HFimpEF: Nonischemic cardiomyopathy.  Coronary CTA in 8/21 with mild nonobstructive CAD.  Echo in 5/22 with EF 25-30%, moderate LV dilation, normal RV.  Most likely cause of his cardiomyopathy is prior doxorubicin  chemotherapy with initial treatment of his mantle cell lymphoma. Does have multiple family members with CHF, cannot r/o genetic CM. CMRI 9/22 showed LV EF 43%, RV EF 48%, small areas of mid-wall LGE in the basal inferior and basal inferoseptal walls cannot r/o myocarditis as cause of cardiomyopathy. Most recent echo 5/25 with recovered EF 50-55%. - NYHA I. Euvolemic. No need for diuretics.   - Decrease Coreg  to 12.5 mg bid (has been taking 18.75 mg bid, having dizziness)   - Continue spironolactone  25 mg daily. Labs 10/06/23 reviewed, Cr 1.34, K 4.2. - Continue empagliflozin  10 mg daily.  - Continue entresto  97/103 mg bid.  - EF is out of range for ICD.  2. CAD: coronary CTA 8/21 with mild, nonobstructive CAD. Noted CAD on CT chest 9/25. 4. Mitral Regurgitation: Myxomatous mitral valve. Mod to severe on echo 5/25, with  normal LV function. - if becomes symptomatic, will refer to for possible mTEER. Discussed today. 5. H/o CVA: Patient had acute CVA in 6/21, imaging also showed evidence for prior CVAs as well.  He had moderate LICA stenosis but this was thought to be a bystander by neurology.  CVA likely related to atrial fibrillation.  - Continue Eliquis . No bleeding issues. Recent CBC stable. 6. Carotid stenosis: Moderate LICA stenosis. Carotid dopplers (10/23) showed 60-79% LICA stenosis. 4/25 unchanged. Repeat in 1 year. 7. Hyperlipidemia: Goal LDL < 70.  - Continue atorvastatin  8. Atrial fibrillation: Paroxysmal. - Continue apixaban .  9. Functionally bicuspid aortic valve: Echo 3/23 showed no AS, mild AI. Echo 5/25, no stenosis. 10. Ascending aortic aneurysm: 4.3 cm ascending aorta on CTA in 2021.  Stable 4.2 cm on CTA 4/23 and MRA 4/24.  11. Memory issues: no progression. follows annually with neurology  Follow up in 6 month with Dr. McLean  [place] Majel Giel, NP 11/18/2023

## 2023-11-18 NOTE — Patient Instructions (Signed)
 Medication Changes:  DECREASE Carvedilol  to 12.5 mg (1 tab) Twice daily    Special Instructions // Education:  Do the following things EVERYDAY: Weigh yourself in the morning before breakfast. Write it down and keep it in a log. Take your medicines as prescribed Eat low salt foods--Limit salt (sodium) to 2000 mg per day.  Stay as active as you can everyday Limit all fluids for the day to less than 2 liters   Follow-Up in: 6 months with Dr Rolan (April 2026), **PLEASE CALL OUR OFFICE IN FEBRUARY TO SCHEDULE THIS APPOINTMENT   At the [facility], you and your health needs are our priority. We have a designated team specialized in the treatment of Heart Failure. This Care Team includes your primary Heart Failure Specialized Cardiologist (physician), Advanced Practice Providers (APPs- Physician Assistants and Nurse Practitioners), and Pharmacist who all work together to provide you with the care you need, when you need it.   You may see any of the following providers on your designated Care Team at your next follow up:  Dr. Toribio Fuel Dr. Ezra Rolan Dr. Ria Commander Dr. Odis Brownie Greig Mosses, NP Caffie Shed, [place] [facility] [place], [place] Beckey Coe, NP [place] Lee, NP Tinnie Redman, PharmD   Please be sure to bring in all your medications bottles to every appointment.   Need to Contact Us :  If you have any questions or concerns before your next appointment please send us  a message through [place] or call our office at [phone].    TO LEAVE A MESSAGE FOR THE NURSE SELECT OPTION 2, PLEASE LEAVE A MESSAGE INCLUDING: YOUR NAME DATE OF BIRTH CALL BACK NUMBER REASON FOR CALL**this is important as we prioritize the call backs  YOU WILL RECEIVE A CALL BACK THE SAME DAY AS LONG AS YOU CALL BEFORE 4:00 PM

## 2023-11-22 NOTE — Progress Notes (Unsigned)
 [facility] [phone]   [phone]  PROGRESS NOTE  Patient Care Team: Maree Leni Edyth DELENA, MD as PCP - General (Family Medicine) Mona Vinie BROCKS, MD as PCP - Cardiology (Cardiology) Cindie Ole DASEN, MD as PCP - Electrophysiology (Cardiology) Belinda Cough, MD as Consulting Physician (General Surgery) Dianna Specking, MD as Consulting Physician (Gastroenterology) Debarah Elsie DELENA, MD as Referring Physician (Internal Medicine) Claudean Laneta Mclean, MD as Referring Physician (Hematology and Oncology) Army Dallas NOVAK, MD (Inactive) as Consulting Physician (Cardiothoracic Surgery) Federico Norleen DASEN MADISON, MD as Consulting Physician (Hematology and Oncology)  Hematological/Oncological History # Marginal Zone Lymphoma, In Remission  (a) s/p coil embolization of the feeding (gastro-duodenal) artery 01/23/2015             (b) anemia--scant iron stores on bone marrow biopsy--s/p feraheme  02/13/2015   (1) right inguinal lymph node biopsy 01/22/2015 confirms mantle cell lymphoma             (a) bone marrow biopsy 02/06/2015 positive for involvement by the patient's mantle cell lymphoma             (b) IPI score of 5 (high risk) predicts a 5 year progression free survival of 50% with CHOP-Rituxan  chemotherapy             (c) MIPI score of 5 (intermediate risk) predicts a median survival of 58 months             (d) the cells were CD5 positive, CD19 and 20+, CD23 negative, lambda restricted   (2) CHOP/Rituxan  started 02/09/2015, completed 8 cycles 07/05/2015; total doxorubicin  dose 300 mg/M2   (3)  [facility] consultation 04/06/2015. Patient opted against transplant consolidation   (4) rituximab  maintenance started 08/16/2015, repeat every 2 months, discontinued February 2019             (a) PET scan had shown a suggestion of recurrence, but see (6) below   (5) PSA increase noted July 2017, back to baseline by December 2017             (a) also noted to be high  (13.0) October 2021, resolved with antibiotics   (6) restaging PET scan 11/17/2016 is consistent with disease progression             (a) repeat PET scan 02/02/2017 confirms increase in lymph nodes             (b) PET scan 04/20/2017 shows increasing hypermetabolic adenopathy             (c) bronchoscopy/mediastinoscopy 05/05/2017 shows granulomatous disease, noncaseating, with negative AFB/ GMS stains; <10% of lymphocytes are B-cell with slight lambda excess             (d) PET scan on 07/06/2017 shows mild increase in mediastinal lymphadenopathy. Increased hypermetabolic supraclavicular lymph node.             (e) PET scan 12/24/2017 interpreted as showing evidence of partial response                    (f) PET 09/21/2018 shows decreased size and metabolism of scattered measurable nodes; Deauville 4             (g) PET scan 11/29/2019 essentially unchanged except for increased prostate activity (see #10 below).             (h) thoracic surgery (Dr. Carleton Gaskins) feels it would be very difficult to obtain a biopsy of the small positive areas in  the most recent scan             (i) PET scan 11/13/2020 shows decreased activity in the previously noted subcentimeter Deauville-4 mediastinal and hilar adenopathy              (7) ibrutinib  420 mg daily started 02/09/2017, discontinued March 2019             (a) resumed April 2019 at 560 mg daily             (b) dose back to 420 mg daily as of May 2020  04/09/2021: Transition care to Dr. Federico due to Dr. Tauna retirement.  Patient continuing on ibrutinib  420 mg p.o. daily  Interval History:  [name] 73 y.o. male with medical history significant for mantle cell lymphoma in remission who presents for a follow up visit. The patient's last visit was on 08/24/2023. In the interim since the last visit he has had no major changes in his health.  On exam today [name] reports he has been well overall in the room since her last visit and is feeling  quite well.  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  He notes that he has good appetite and is eating well.  He is able to continue to mow the lawn and cut his children's grass.  He notes that he did see cardiology recently.  He notes that he continues to take his ibrutinib  pills and it causes some odd dreams but no issues with bleeding, bruising, or infectious symptoms.  Denies any runny nose, sore throat, cough.  His weight remains steady.  Overall he feels quite well and is willing and able to continue on ibrutinib  therapy at this time.  He has no signs or symptoms concerning for recurrent disease.  A full 10 point ROS otherwise negative.  MEDICAL HISTORY:  Past Medical History:  Diagnosis Date   Anemia    Cancer (HCC)    Lymphoma   Essential hypertension    GERD (gastroesophageal reflux disease)    GI bleed    HLD (hyperlipidemia)    Weakness     SURGICAL HISTORY: Past Surgical History:  Procedure Laterality Date   ESOPHAGOGASTRODUODENOSCOPY Left 01/18/2015   Procedure: ESOPHAGOGASTRODUODENOSCOPY (EGD);  Surgeon: Jerrell Sol, MD;  Location: [facility] ENDOSCOPY;  Service: Endoscopy;  Laterality: Left;   INGUINAL HERNIA REPAIR Right 01/22/2015   Procedure: RIGHT INGUINAL LYMPH NODE BX;  Surgeon: Donnice Lima, MD;  Location: [facility] OR;  Service: General;  Laterality: Right;   IR REMOVAL TUN ACCESS W/ PORT W/O FL MOD SED  01/24/2022   MEDIASTINOSCOPY N/A 05/05/2017   Procedure: MEDIASTINOSCOPY;  Surgeon: Army Dallas NOVAK, MD;  Location: [facility] OR;  Service: Thoracic;  Laterality: N/A;   PORTACATH PLACEMENT  01/26/2015    power port with tip SVC/RA [place]   SKIN SURGERY     Small benign cysts over left scalp removed   VIDEO BRONCHOSCOPY WITH ENDOBRONCHIAL ULTRASOUND N/A 05/05/2017   Procedure: VIDEO BRONCHOSCOPY WITH ENDOBRONCHIAL ULTRASOUND;  Surgeon: Army Dallas NOVAK, MD;  Location: [facility] OR;  Service: Thoracic;   Laterality: N/A;    SOCIAL HISTORY: Social History   Socioeconomic History   Marital status: Married    Spouse name: Not on file   Number of children: Not on file   Years of education: Not on file   Highest education level: Bachelor's degree (e.g., BA, AB, BS)  Occupational History   Occupation: Retired  Tobacco Use   Smoking status: Never   Smokeless tobacco: Never  Vaping Use   Vaping status: Never Used  Substance and Sexual Activity   Alcohol use: Never    Alcohol/week: 6.0 standard drinks of alcohol    Types: 3 Cans of beer, 3 Shots of liquor per week   Drug use: No   Sexual activity: Yes    Partners: Female  Other Topics Concern   Not on file  Social History Narrative   Not on file   Social Drivers of Health   Financial Resource Strain: Low Risk  (05/23/2022)   Overall Financial Resource Strain (CARDIA)    Difficulty of Paying Living Expenses: Not hard at all  Food Insecurity: No Food Insecurity (01/05/2023)   Hunger Vital Sign    Worried About Running Out of Food in the Last Year: Never true    Ran Out of Food in the Last Year: Never true  Transportation Needs: No Transportation Needs (01/05/2023)   PRAPARE - Administrator, Civil Service (Medical): No    Lack of Transportation (Non-Medical): No  Physical Activity: Sufficiently Active (05/23/2022)   Exercise Vital Sign    Days of Exercise per Week: 5 days    Minutes of Exercise per Session: 30 min  Stress: No Stress Concern Present (04/02/2021)   [employer] of Occupational Health - Occupational Stress Questionnaire    Feeling of Stress : Not at all  Social Connections: Moderately Isolated (05/23/2022)   Social Connection and Isolation Panel    Frequency of Communication with Friends and Family: More than three times a week    Frequency of Social Gatherings with Friends and Family: Once a week    Attends Religious Services: Never    Database administrator or Organizations: No    Attends Probation officer: Not on file    Marital Status: Married  Catering manager Violence: Not At Risk (01/05/2023)   Humiliation, Afraid, Rape, and Kick questionnaire    Fear of Current or Ex-Partner: No    Emotionally Abused: No    Physically Abused: No    Sexually Abused: No    FAMILY HISTORY: Family History  Problem Relation Age of Onset   Hypertension Mother    Hypertension Father    Kidney failure Father    Hypertension Sister    Diabetes Sister    Lupus Sister    Prostate cancer Brother    Lung cancer Brother    Colon cancer Neg Hx    Stomach cancer Neg Hx     ALLERGIES:  has no known allergies.  MEDICATIONS:  Current Outpatient Medications  Medication Sig Dispense Refill   atorvastatin  (LIPITOR ) 80 MG tablet Take 1 tablet (80 mg total) by mouth daily. 30 tablet 0   carvedilol  (COREG ) 12.5 MG tablet Take 1 tablet (12.5 mg total) by mouth 2 (two) times daily with a meal. 60 tablet 11   ELIQUIS  5 MG TABS tablet TAKE 1 TABLET BY MOUTH TWICE A DAY 180 tablet 0   folic acid  (FOLVITE ) 1 MG tablet TAKE 1 TABLET BY MOUTH EVERY DAY 90 tablet 2   ibrutinib  (IMBRUVICA ) 420 MG tablet Take 1 tablet (420 mg) by mouth daily. 28 tablet 6   JARDIANCE  10 MG TABS tablet TAKE 1 TABLET BY MOUTH EVERY DAY BEFORE BREAKFAST 30 tablet 11   sacubitril -valsartan  (ENTRESTO ) 97-103 MG TAKE 1 TABLET BY MOUTH TWICE A DAY 180 tablet 3  No current facility-administered medications for this visit.    REVIEW OF SYSTEMS:   Constitutional: ( - ) fevers, ( - )  chills , ( - ) night sweats Eyes: ( - ) blurriness of vision, ( - ) double vision, ( - ) watery eyes Ears, nose, mouth, throat, and face: ( - ) mucositis, ( - ) sore throat Respiratory: ( - ) cough, ( - ) dyspnea, ( - ) wheezes Cardiovascular: ( - ) palpitation, ( - ) chest discomfort, ( - ) lower extremity swelling Gastrointestinal:  ( - ) nausea, ( - ) heartburn, ( - ) change in bowel habits Skin: ( - ) abnormal skin rashes Lymphatics: (  - ) new lymphadenopathy, ( - ) easy bruising Neurological: ( - ) numbness, ( - ) tingling, ( - ) new weaknesses Behavioral/Psych: ( - ) mood change, ( - ) new changes  All other systems were reviewed with the patient and are negative.  PHYSICAL EXAMINATION:  Vitals:   11/23/23 1007  BP: (!) 146/84  Pulse: (!) 49  Resp: 15  Temp: 97.8 F (36.6 C)  SpO2: 100%      Filed Weights   11/23/23 1007  Weight: 184 lb (83.5 kg)       GENERAL: Well-appearing middle-aged African-American male, alert, no distress and comfortable SKIN: skin color, texture, turgor are normal, no rashes or significant lesions EYES: conjunctiva are pink and non-injected, sclera clear LUNGS: clear to auscultation and percussion with normal breathing effort HEART: regular rate & rhythm and no murmurs and no lower extremity edema Musculoskeletal: no cyanosis of digits and no clubbing. Large mobile, soft mass on mid back most consistent with lipoma.  PSYCH: alert & oriented x 3, fluent speech NEURO: no focal motor/sensory deficits  LABORATORY DATA:  I have reviewed the data as listed    Latest Ref Rng & Units 11/23/2023    9:27 AM 10/06/2023    8:02 AM 09/18/2023    7:48 AM  CBC  WBC 4.0 - 10.5 K/uL 3.7  3.4  3.4   Hemoglobin 13.0 - 17.0 g/dL 87.5  88.1  89.4   Hematocrit 39.0 - 52.0 % 39.1  36.8  32.7   Platelets 150 - 400 K/uL 246  265  245        Latest Ref Rng & Units 11/23/2023    9:27 AM 10/06/2023    8:02 AM 09/18/2023    7:48 AM  CMP  Glucose 70 - 99 mg/dL 88  85  96   BUN 8 - 23 mg/dL 17  20  21    Creatinine 0.61 - 1.24 mg/dL 8.54  8.65  8.45   Sodium 135 - 145 mmol/L 140  142  141   Potassium 3.5 - 5.1 mmol/L 4.4  4.2  4.8   Chloride 98 - 111 mmol/L 107  108  107   CO2 22 - 32 mmol/L 29  28  27    Calcium  8.9 - 10.3 mg/dL 9.3  8.9  8.8   Total Protein 6.5 - 8.1 g/dL 6.3  6.4  6.0   Total Bilirubin 0.0 - 1.2 mg/dL 0.9  0.9  0.9   Alkaline Phos 38 - 126 U/L 27  26  25    AST 15 - 41 U/L  17  18  19    ALT 0 - 44 U/L 15  16  20      RADIOGRAPHIC STUDIES: No results found.   ASSESSMENT & PLAN [name] 73 y.o. male  with medical history significant for mantle cell lymphoma in remission who presents for a follow up visit.  #Mantle Cell Lymphoma in Remission -- Prior PET CT scan in October 2022 showed no evidence of residual or recurrent disease, though there were some lymph nodes concerning for sarcoidosis --Patient has been remission since at least 06/11/2015 at which time his PET CT scan showed no evidence of recurrent or residual disease. --CT in Sept 2025,  no clear evidence of relapsed/recurrent disease. Recommend standard CT scan q 6 months. CT scan due March 2026  --Labs from today show WBC 3.7, Hgb 12.4, MCV 88.7, Plt 246. Cr 1.45, LFTs WNL  --Continue ibrutinib  420 mg p.o. daily --Return to clinic 3 months with labs.   # AKI--improved  -- Asked the patient at last visit to hold his Entresto  and spironolactone  after he had 8 pound weight drop -- patient follows with cardiology  -- Continue to monitor   No orders of the defined types were placed in this encounter.   All questions were answered. The patient knows to call the clinic with any problems, questions or concerns.  I have spent a total of 30 minutes minutes of face-to-face and non-face-to-face time, preparing to see the patient performing a medically appropriate examination, counseling and educating the patient, ordering tests, documenting clinical information in the electronic health record, and care coordination.   Norleen IVAR Kidney, MD Department of Hematology/Oncology [facility] at [facility] Phone: [phone] Pager: [phone] Email: [email or web address]  11/23/2023 10:46 AM

## 2023-11-23 ENCOUNTER — Telehealth: Payer: Self-pay | Admitting: Hematology and Oncology

## 2023-11-23 ENCOUNTER — Inpatient Hospital Stay: Attending: Hematology and Oncology

## 2023-11-23 ENCOUNTER — Inpatient Hospital Stay: Admitting: Hematology and Oncology

## 2023-11-23 VITALS — BP 146/84 | HR 49 | Temp 97.8°F | Resp 15 | Wt 184.0 lb

## 2023-11-23 DIAGNOSIS — C8319 Mantle cell lymphoma, extranodal and solid organ sites: Secondary | ICD-10-CM

## 2023-11-23 DIAGNOSIS — C8841 Extranodal marginal zone b-cell lymphoma of mucosa-associated lymphoid tissue (malt-lymphoma), in remission: Secondary | ICD-10-CM | POA: Diagnosis present

## 2023-11-23 DIAGNOSIS — Z9221 Personal history of antineoplastic chemotherapy: Secondary | ICD-10-CM | POA: Diagnosis not present

## 2023-11-23 DIAGNOSIS — Z801 Family history of malignant neoplasm of trachea, bronchus and lung: Secondary | ICD-10-CM | POA: Insufficient documentation

## 2023-11-23 DIAGNOSIS — N179 Acute kidney failure, unspecified: Secondary | ICD-10-CM | POA: Diagnosis not present

## 2023-11-23 LAB — CMP (CANCER CENTER ONLY)
ALT: 15 U/L (ref 0–44)
AST: 17 U/L (ref 15–41)
Albumin: 4.2 g/dL (ref 3.5–5.0)
Alkaline Phosphatase: 27 U/L — ABNORMAL LOW (ref 38–126)
Anion gap: 4 — ABNORMAL LOW (ref 5–15)
BUN: 17 mg/dL (ref 8–23)
CO2: 29 mmol/L (ref 22–32)
Calcium: 9.3 mg/dL (ref 8.9–10.3)
Chloride: 107 mmol/L (ref 98–111)
Creatinine: 1.45 mg/dL — ABNORMAL HIGH (ref 0.61–1.24)
GFR, Estimated: 52 mL/min — ABNORMAL LOW (ref >60)
Glucose, Bld: 88 mg/dL (ref 70–99)
Potassium: 4.4 mmol/L (ref 3.5–5.1)
Sodium: 140 mmol/L (ref 135–145)
Total Bilirubin: 0.9 mg/dL (ref 0.0–1.2)
Total Protein: 6.3 g/dL — ABNORMAL LOW (ref 6.5–8.1)

## 2023-11-23 LAB — CBC WITH DIFFERENTIAL (CANCER CENTER ONLY)
Abs Immature Granulocytes: 0.02 K/uL (ref 0.00–0.07)
Basophils Absolute: 0 K/uL (ref 0.0–0.1)
Basophils Relative: 1 %
Eosinophils Absolute: 0 K/uL (ref 0.0–0.5)
Eosinophils Relative: 1 %
HCT: 39.1 % (ref 39.0–52.0)
Hemoglobin: 12.4 g/dL — ABNORMAL LOW (ref 13.0–17.0)
Immature Granulocytes: 1 %
Lymphocytes Relative: 32 %
Lymphs Abs: 1.2 K/uL (ref 0.7–4.0)
MCH: 28.1 pg (ref 26.0–34.0)
MCHC: 31.7 g/dL (ref 30.0–36.0)
MCV: 88.7 fL (ref 80.0–100.0)
Monocytes Absolute: 0.6 K/uL (ref 0.1–1.0)
Monocytes Relative: 15 %
Neutro Abs: 1.9 K/uL (ref 1.7–7.7)
Neutrophils Relative %: 50 %
Platelet Count: 246 K/uL (ref 150–400)
RBC: 4.41 MIL/uL (ref 4.22–5.81)
RDW: 13.4 % (ref 11.5–15.5)
WBC Count: 3.7 K/uL — ABNORMAL LOW (ref 4.0–10.5)
nRBC: 0 % (ref 0.0–0.2)

## 2023-11-23 LAB — LACTATE DEHYDROGENASE: LDH: 179 U/L (ref 98–192)

## 2023-11-23 NOTE — Telephone Encounter (Signed)
 Scheduled patient for next appointments. Called and spoke with the patient, he is aware.

## 2023-11-26 ENCOUNTER — Other Ambulatory Visit (HOSPITAL_COMMUNITY): Payer: Self-pay

## 2023-11-30 ENCOUNTER — Other Ambulatory Visit: Payer: Self-pay

## 2023-11-30 NOTE — Progress Notes (Signed)
 Specialty Pharmacy Refill Coordination Note  [name] is a 73 y.o. male contacted today regarding refills of specialty medication(s) Ibrutinib  (IMBRUVICA )   Patient requested Delivery   Delivery date: 12/04/23   Verified address: [address] [place] [address]   Medication will be filled on: 12/03/23

## 2023-12-02 ENCOUNTER — Other Ambulatory Visit: Payer: Self-pay

## 2023-12-14 ENCOUNTER — Encounter: Payer: Self-pay | Admitting: Oncology

## 2023-12-14 ENCOUNTER — Other Ambulatory Visit (HOSPITAL_COMMUNITY): Payer: Self-pay

## 2023-12-14 ENCOUNTER — Telehealth: Payer: Self-pay

## 2023-12-14 NOTE — Telephone Encounter (Signed)
 Oral Oncology Patient Advocate Encounter  Was successful in securing patient a $8500 grant from [facility] to provide copayment coverage for IMBRUVICA .  This will keep the out of pocket expense at $0.     [employer] ID: [id number]   The billing information is as follows and has been shared with [facility].    RxBin: [id number] PCN: [id number] Member ID: [id number] Group ID: [id number] Dates of Eligibility: 01/06/24 through 01/04/25  Fund:  [facility]  Lucie Lamer, CPhT   [facility] Oncology Pharmacy Patient Advocate Specialist [place] Phone: [phone]  Fax: [phone] [email or web address]

## 2023-12-23 ENCOUNTER — Other Ambulatory Visit: Payer: Self-pay

## 2023-12-25 ENCOUNTER — Other Ambulatory Visit: Payer: Self-pay

## 2023-12-25 ENCOUNTER — Telehealth: Payer: Self-pay | Admitting: Internal Medicine

## 2023-12-25 ENCOUNTER — Other Ambulatory Visit: Payer: Self-pay | Admitting: Pharmacy Technician

## 2023-12-25 DIAGNOSIS — I480 Paroxysmal atrial fibrillation: Secondary | ICD-10-CM

## 2023-12-25 NOTE — Progress Notes (Signed)
 Specialty Pharmacy Refill Coordination Note  [name] is a 73 y.o. male contacted today regarding refills of specialty medication(s) Ibrutinib  (IMBRUVICA )   Patient requested Delivery   Delivery date: 12/29/23   Verified address: [address]  [place]   Medication will be filled on: 12/28/23

## 2023-12-25 NOTE — Telephone Encounter (Signed)
*  STAT* If patient is at the pharmacy, call can be transferred to refill team.   1. Which medications need to be refilled? (please list name of each medication and dose if known) ELIQUIS  5 MG TABS tablet    2. Would you like to learn more about the convenience, safety, & potential cost savings by using the [facility]? No     3. Are you open to using the [facility] (Type [facility]. No    4. Which pharmacy/location (including street and city if local pharmacy) is medication to be sent to? [id number] - [place], [place] - [address]     5. Do they need a 30 day or 90 day supply? 90 day   Pt is out medication.

## 2023-12-28 NOTE — Telephone Encounter (Signed)
 Wife is calling for update, pateint is almost out of medication. Please see below

## 2023-12-28 NOTE — Telephone Encounter (Signed)
 Wife is following up. Patient is completely out of medication.

## 2023-12-29 MED ORDER — APIXABAN 5 MG PO TABS
5.0000 mg | ORAL_TABLET | Freq: Two times a day (BID) | ORAL | 1 refills | Status: AC
Start: 2023-12-29 — End: ?

## 2023-12-29 NOTE — Telephone Encounter (Signed)
 Pt last saw Gary Lee, NP on 11/18/23, last labs 11/23/23 Creat 1.45, age 73, weight 83.5kg, based on specified criteria pt is on appropriate dosage of Eliquis  5mg  BID for afib/CVA.  Will refill rx.

## 2024-01-18 ENCOUNTER — Other Ambulatory Visit: Payer: Self-pay

## 2024-01-20 ENCOUNTER — Other Ambulatory Visit (HOSPITAL_COMMUNITY): Payer: Self-pay

## 2024-01-22 ENCOUNTER — Other Ambulatory Visit: Payer: Self-pay | Admitting: Pharmacy Technician

## 2024-01-22 ENCOUNTER — Other Ambulatory Visit: Payer: Self-pay

## 2024-01-22 NOTE — Progress Notes (Signed)
 Specialty Pharmacy Refill Coordination Note  [name] is a 73 y.o. male contacted today regarding refills of specialty medication(s) Ibrutinib  (IMBRUVICA )   Patient requested Delivery   Delivery date: 01/27/24   Verified address: [address]  [place]   Medication will be filled on: 01/26/24

## 2024-02-16 ENCOUNTER — Other Ambulatory Visit: Payer: Self-pay

## 2024-02-18 ENCOUNTER — Other Ambulatory Visit: Payer: Self-pay

## 2024-02-18 ENCOUNTER — Other Ambulatory Visit: Payer: Self-pay | Admitting: Pharmacy Technician

## 2024-02-18 NOTE — Progress Notes (Signed)
 Specialty Pharmacy Refill Coordination Note  RICKARD KENNERLY is a 74 y.o. male contacted today regarding refills of specialty medication(s) Ibrutinib  (IMBRUVICA )   Patient requested Delivery   Delivery date: 02/25/24   Verified address: [address]  [place]    Medication will be filled on: 02/24/24

## 2024-02-24 ENCOUNTER — Other Ambulatory Visit: Payer: Self-pay

## 2024-02-24 ENCOUNTER — Inpatient Hospital Stay: Admitting: Hematology and Oncology

## 2024-02-24 ENCOUNTER — Inpatient Hospital Stay: Attending: Hematology and Oncology

## 2024-02-24 VITALS — BP 139/76 | HR 61 | Temp 98.0°F | Resp 17 | Ht 70.0 in | Wt 181.0 lb

## 2024-02-24 DIAGNOSIS — C8319 Mantle cell lymphoma, extranodal and solid organ sites: Secondary | ICD-10-CM

## 2024-02-24 LAB — CMP (CANCER CENTER ONLY)
ALT: 21 U/L (ref 0–44)
AST: 23 U/L (ref 15–41)
Albumin: 4.4 g/dL (ref 3.5–5.0)
Alkaline Phosphatase: 35 U/L — ABNORMAL LOW (ref 38–126)
Anion gap: 12 (ref 5–15)
BUN: 18 mg/dL (ref 8–23)
CO2: 26 mmol/L (ref 22–32)
Calcium: 9.1 mg/dL (ref 8.9–10.3)
Chloride: 103 mmol/L (ref 98–111)
Creatinine: 1.39 mg/dL — ABNORMAL HIGH (ref 0.61–1.24)
GFR, Estimated: 54 mL/min — ABNORMAL LOW
Glucose, Bld: 85 mg/dL (ref 70–99)
Potassium: 4.2 mmol/L (ref 3.5–5.1)
Sodium: 140 mmol/L (ref 135–145)
Total Bilirubin: 1.2 mg/dL (ref 0.0–1.2)
Total Protein: 6.6 g/dL (ref 6.5–8.1)

## 2024-02-24 LAB — CBC WITH DIFFERENTIAL (CANCER CENTER ONLY)
Abs Immature Granulocytes: 0.03 K/uL (ref 0.00–0.07)
Basophils Absolute: 0 K/uL (ref 0.0–0.1)
Basophils Relative: 0 %
Eosinophils Absolute: 0 K/uL (ref 0.0–0.5)
Eosinophils Relative: 0 %
HCT: 42.4 % (ref 39.0–52.0)
Hemoglobin: 13.9 g/dL (ref 13.0–17.0)
Immature Granulocytes: 1 %
Lymphocytes Relative: 26 %
Lymphs Abs: 1.2 K/uL (ref 0.7–4.0)
MCH: 27.9 pg (ref 26.0–34.0)
MCHC: 32.8 g/dL (ref 30.0–36.0)
MCV: 85.1 fL (ref 80.0–100.0)
Monocytes Absolute: 0.8 K/uL (ref 0.1–1.0)
Monocytes Relative: 17 %
Neutro Abs: 2.6 K/uL (ref 1.7–7.7)
Neutrophils Relative %: 56 %
Platelet Count: 224 K/uL (ref 150–400)
RBC: 4.98 MIL/uL (ref 4.22–5.81)
RDW: 14.7 % (ref 11.5–15.5)
WBC Count: 4.6 K/uL (ref 4.0–10.5)
nRBC: 0 % (ref 0.0–0.2)

## 2024-02-24 LAB — LACTATE DEHYDROGENASE: LDH: 208 U/L (ref 105–235)

## 2024-02-24 NOTE — Progress Notes (Signed)
 " [facility] [phone]   [phone]  PROGRESS NOTE  Patient Care Team: Maree Leni Edyth DELENA, MD as PCP - General (Family Medicine) Mona Vinie BROCKS, MD as PCP - Cardiology (Cardiology) Cindie Ole DASEN, MD (Inactive) as PCP - Electrophysiology (Cardiology) Belinda Cough, MD as Consulting Physician (General Surgery) Dianna Specking, MD as Consulting Physician (Gastroenterology) Debarah Elsie DELENA, MD as Referring Physician (Internal Medicine) Claudean Laneta Mclean, MD as Referring Physician (Hematology and Oncology) Army Dallas NOVAK, MD (Inactive) as Consulting Physician (Cardiothoracic Surgery) Federico Norleen DASEN MADISON, MD as Consulting Physician (Hematology and Oncology)  Hematological/Oncological History # Marginal Zone Lymphoma, In Remission  (a) s/p coil embolization of the feeding (gastro-duodenal) artery 01/23/2015             (b) anemia--scant iron stores on bone marrow biopsy--s/p feraheme  02/13/2015   (1) right inguinal lymph node biopsy 01/22/2015 confirms mantle cell lymphoma             (a) bone marrow biopsy 02/06/2015 positive for involvement by the patient's mantle cell lymphoma             (b) IPI score of 5 (high risk) predicts a 5 year progression free survival of 50% with CHOP-Rituxan  chemotherapy             (c) MIPI score of 5 (intermediate risk) predicts a median survival of 58 months             (d) the cells were CD5 positive, CD19 and 20+, CD23 negative, lambda restricted   (2) CHOP/Rituxan  started 02/09/2015, completed 8 cycles 07/05/2015; total doxorubicin  dose 300 mg/M2   (3)  [facility] consultation 04/06/2015. Patient opted against transplant consolidation   (4) rituximab  maintenance started 08/16/2015, repeat every 2 months, discontinued February 2019             (a) PET scan had shown a suggestion of recurrence, but see (6) below   (5) PSA increase noted July 2017, back to baseline by December 2017             (a) also noted  to be high (13.0) October 2021, resolved with antibiotics   (6) restaging PET scan 11/17/2016 is consistent with disease progression             (a) repeat PET scan 02/02/2017 confirms increase in lymph nodes             (b) PET scan 04/20/2017 shows increasing hypermetabolic adenopathy             (c) bronchoscopy/mediastinoscopy 05/05/2017 shows granulomatous disease, noncaseating, with negative AFB/ GMS stains; <10% of lymphocytes are B-cell with slight lambda excess             (d) PET scan on 07/06/2017 shows mild increase in mediastinal lymphadenopathy. Increased hypermetabolic supraclavicular lymph node.             (e) PET scan 12/24/2017 interpreted as showing evidence of partial response                    (f) PET 09/21/2018 shows decreased size and metabolism of scattered measurable nodes; Deauville 4             (g) PET scan 11/29/2019 essentially unchanged except for increased prostate activity (see #10 below).             (h) thoracic surgery (Dr. Carleton Gaskins) feels it would be very difficult to obtain a biopsy of the small positive  areas in the most recent scan             (i) PET scan 11/13/2020 shows decreased activity in the previously noted subcentimeter Deauville-4 mediastinal and hilar adenopathy              (7) ibrutinib  420 mg daily started 02/09/2017, discontinued March 2019             (a) resumed April 2019 at 560 mg daily             (b) dose back to 420 mg daily as of May 2020  04/09/2021: Transition care to Dr. Federico due to Dr. Tauna Taylor.  Patient continuing on ibrutinib  420 mg p.o. daily  Interval History:  [name] 74 y.o. male with medical history significant for mantle cell lymphoma in remission who presents for a follow up visit. The patient's last visit was on 08/24/2023. In the interim since the last visit he has had no major changes in his health.  On exam today [name] reports he has been well overall in interim since our last visit.  He  reports that his energy is a 10 out of 10 and his appetite is still strong, but not as good as it has been in the past.  He reports that he continues to work very hard physically including yard work for his children.  He notes he is had no issues with runny nose, sore throat, cough.  Denies any fevers, chills, sweats.  His weight is down to 101 pounds and reports that back in 2016 he used to weigh about 200 pounds.  His weight however is stable from our last visit.  He notes he has had no bumps or lumps concerning for recurrent disease.  He reports he continues taking his ibrutinib  pills faithfully with no bleeding, bruising, palpitations, or infectious symptoms.  Overall he feels quite well and has no additional questions concerns or complaints today.  Full 10 point ROS is otherwise negative.  MEDICAL HISTORY:  Past Medical History:  Diagnosis Date   Anemia    Cancer (HCC)    Lymphoma   Essential hypertension    GERD (gastroesophageal reflux disease)    GI bleed    HLD (hyperlipidemia)    Weakness     SURGICAL HISTORY: Past Surgical History:  Procedure Laterality Date   ESOPHAGOGASTRODUODENOSCOPY Left 01/18/2015   Procedure: ESOPHAGOGASTRODUODENOSCOPY (EGD);  Surgeon: Jerrell Sol, MD;  [facility];  Service: Endoscopy;  Laterality: Left;   INGUINAL HERNIA REPAIR Right 01/22/2015   Procedure: RIGHT INGUINAL LYMPH NODE BX;  Surgeon: Donnice Lima, MD;  Location: [facility] OR;  Service: General;  Laterality: Right;   IR REMOVAL TUN ACCESS W/ PORT W/O FL MOD SED  01/24/2022   MEDIASTINOSCOPY N/A 05/05/2017   Procedure: MEDIASTINOSCOPY;  Surgeon: Army Dallas NOVAK, MD;  Location: [facility] OR;  Service: Thoracic;  Laterality: N/A;   PORTACATH PLACEMENT  01/26/2015    power port with tip SVC/RA [place]   SKIN SURGERY     Small benign cysts over left scalp removed   VIDEO BRONCHOSCOPY WITH ENDOBRONCHIAL ULTRASOUND N/A 05/05/2017   Procedure: VIDEO BRONCHOSCOPY WITH ENDOBRONCHIAL ULTRASOUND;   Surgeon: Army Dallas NOVAK, MD;  Location: [facility] OR;  Service: Thoracic;  Laterality: N/A;    SOCIAL HISTORY: Social History   Socioeconomic History   Marital status: Married    Spouse name: Not on file   Number of children: Not on file   Years of education: Not on  file   Highest education level: Bachelor's degree (e.g., BA, AB, BS)  Occupational History   Occupation: Retired  Tobacco Use   Smoking status: Never   Smokeless tobacco: Never  Vaping Use   Vaping status: Never Used  Substance and Sexual Activity   Alcohol use: Never    Alcohol/week: 6.0 standard drinks of alcohol    Types: 3 Cans of beer, 3 Shots of liquor per week   Drug use: No   Sexual activity: Yes    Partners: Female  Other Topics Concern   Not on file  Social History Narrative   Not on file   Social Drivers of Health   Tobacco Use: Low Risk (11/18/2023)   Patient History    Smoking Tobacco Use: Never    Smokeless Tobacco Use: Never    Passive Exposure: Not on file  Financial Resource Strain: Low Risk (05/23/2022)   Overall Financial Resource Strain (CARDIA)    Difficulty of Paying Living Expenses: Not hard at all  Food Insecurity: No Food Insecurity (01/05/2023)   Hunger Vital Sign    Worried About Running Out of Food in the Last Year: Never true    Ran Out of Food in the Last Year: Never true  Transportation Needs: No Transportation Needs (01/05/2023)   PRAPARE - Administrator, Civil Service (Medical): No    Lack of Transportation (Non-Medical): No  Physical Activity: Sufficiently Active (05/23/2022)   Exercise Vital Sign    Days of Exercise per Week: 5 days    Minutes of Exercise per Session: 30 min  Stress: No Stress Concern Present (04/02/2021)   [employer] of Occupational Health - Occupational Stress Questionnaire    Feeling of Stress : Not at all  Social Connections: Moderately Isolated (05/23/2022)   Social Connection and Isolation Panel    Frequency of Communication with  Friends and Family: More than three times a week    Frequency of Social Gatherings with Friends and Family: Once a week    Attends Religious Services: Never    Database Administrator or Organizations: No    Attends Engineer, Structural: Not on file    Marital Status: Married  Catering Manager Violence: Not At Risk (01/05/2023)   Humiliation, Afraid, Rape, and Kick questionnaire    Fear of Current or Ex-Partner: No    Emotionally Abused: No    Physically Abused: No    Sexually Abused: No  Depression (PHQ2-9): Low Risk (11/23/2023)   Depression (PHQ2-9)    PHQ-2 Score: 0  Alcohol Screen: Low Risk (04/02/2021)   Alcohol Screen    Last Alcohol Screening Score (AUDIT): 0  Housing: Low Risk (01/05/2023)   Housing    Last Housing Risk Score: 0  Utilities: Not At Risk (01/05/2023)   AHC Utilities    Threatened with loss of utilities: No  Health Literacy: Not on file    FAMILY HISTORY: Family History  Problem Relation Age of Onset   Hypertension Mother    Hypertension Father    Kidney failure Father    Hypertension Sister    Diabetes Sister    Lupus Sister    Prostate cancer Brother    Lung cancer Brother    Colon cancer Neg Hx    Stomach cancer Neg Hx     ALLERGIES:  has no known allergies.  MEDICATIONS:  Current Outpatient Medications  Medication Sig Dispense Refill   LORazepam  (ATIVAN ) 1 MG tablet Take 1 tablet (1 mg total) by  mouth as directed for 2 doses. Take 1 pill approximately 30 to 60 minutes before CT scan.  If still anxious take second pill. 2 tablet 0   apixaban  (ELIQUIS ) 5 MG TABS tablet Take 1 tablet (5 mg total) by mouth 2 (two) times daily. 180 tablet 1   atorvastatin  (LIPITOR ) 80 MG tablet Take 1 tablet (80 mg total) by mouth daily. 30 tablet 0   carvedilol  (COREG ) 12.5 MG tablet Take 1 tablet (12.5 mg total) by mouth 2 (two) times daily with a meal. 60 tablet 11   folic acid  (FOLVITE ) 1 MG tablet TAKE 1 TABLET BY MOUTH EVERY DAY 90 tablet 2    ibrutinib  (IMBRUVICA ) 420 MG tablet Take 1 tablet (420 mg) by mouth daily. 28 tablet 6   JARDIANCE  10 MG TABS tablet TAKE 1 TABLET BY MOUTH EVERY DAY BEFORE BREAKFAST 30 tablet 11   sacubitril -valsartan  (ENTRESTO ) 97-103 MG TAKE 1 TABLET BY MOUTH TWICE A DAY 180 tablet 3   No current facility-administered medications for this visit.    REVIEW OF SYSTEMS:   Constitutional: ( - ) fevers, ( - )  chills , ( - ) night sweats Eyes: ( - ) blurriness of vision, ( - ) double vision, ( - ) watery eyes Ears, nose, mouth, throat, and face: ( - ) mucositis, ( - ) sore throat Respiratory: ( - ) cough, ( - ) dyspnea, ( - ) wheezes Cardiovascular: ( - ) palpitation, ( - ) chest discomfort, ( - ) lower extremity swelling Gastrointestinal:  ( - ) nausea, ( - ) heartburn, ( - ) change in bowel habits Skin: ( - ) abnormal skin rashes Lymphatics: ( - ) new lymphadenopathy, ( - ) easy bruising Neurological: ( - ) numbness, ( - ) tingling, ( - ) new weaknesses Behavioral/Psych: ( - ) mood change, ( - ) new changes  All other systems were reviewed with the patient and are negative.  PHYSICAL EXAMINATION:  Vitals:   02/24/24 1038 02/24/24 1039  BP: (!) 146/93 139/76  Pulse: 61   Resp: 17   Temp: 98 F (36.7 C)   SpO2: 98%        Filed Weights   02/24/24 1038  Weight: 181 lb (82.1 kg)        GENERAL: Well-appearing middle-aged African-American male, alert, no distress and comfortable SKIN: skin color, texture, turgor are normal, no rashes or significant lesions EYES: conjunctiva are pink and non-injected, sclera clear LUNGS: clear to auscultation and percussion with normal breathing effort HEART: regular rate & rhythm and no murmurs and no lower extremity edema Musculoskeletal: no cyanosis of digits and no clubbing. Large mobile, soft mass on mid back most consistent with lipoma.  PSYCH: alert & oriented x 3, fluent speech NEURO: no focal motor/sensory deficits  LABORATORY DATA:  I have  reviewed the data as listed    Latest Ref Rng & Units 02/24/2024    9:35 AM 11/23/2023    9:27 AM 10/06/2023    8:02 AM  CBC  WBC 4.0 - 10.5 K/uL 4.6  3.7  3.4   Hemoglobin 13.0 - 17.0 g/dL 86.0  87.5  88.1   Hematocrit 39.0 - 52.0 % 42.4  39.1  36.8   Platelets 150 - 400 K/uL 224  246  265        Latest Ref Rng & Units 02/24/2024    9:35 AM 11/23/2023    9:27 AM 10/06/2023    8:02 AM  CMP  Glucose  70 - 99 mg/dL 85  88  85   BUN 8 - 23 mg/dL 18  17  20    Creatinine 0.61 - 1.24 mg/dL 8.60  8.54  8.65   Sodium 135 - 145 mmol/L 140  140  142   Potassium 3.5 - 5.1 mmol/L 4.2  4.4  4.2   Chloride 98 - 111 mmol/L 103  107  108   CO2 22 - 32 mmol/L 26  29  28    Calcium  8.9 - 10.3 mg/dL 9.1  9.3  8.9   Total Protein 6.5 - 8.1 g/dL 6.6  6.3  6.4   Total Bilirubin 0.0 - 1.2 mg/dL 1.2  0.9  0.9   Alkaline Phos 38 - 126 U/L 35  27  26   AST 15 - 41 U/L 23  17  18    ALT 0 - 44 U/L 21  15  16      RADIOGRAPHIC STUDIES: No results found.   ASSESSMENT & PLAN [name] 74 y.o. male with medical history significant for mantle cell lymphoma in remission who presents for a follow up visit.  #Mantle Cell Lymphoma in Remission -- Prior PET CT scan in October 2022 showed no evidence of residual or recurrent disease, though there were some lymph nodes concerning for sarcoidosis --Patient has been remission since at least 06/11/2015 at which time his PET CT scan showed no evidence of recurrent or residual disease. --CT in Sept 2025,  no clear evidence of relapsed/recurrent disease. Recommend standard CT scan q 6 months. CT scan due March 2026  --Labs from today show WBC 4.6, Hgb 13.9, MCV 85.1, Plt 224   --Continue ibrutinib  420 mg p.o. daily --Return to clinic 3 months with labs.   # AKI--improved  -- Cr 1.39 today, improved from prior  -- patient follows with cardiology  -- Continue to monitor   Orders Placed This Encounter  Procedures   CT CHEST ABDOMEN PELVIS W CONTRAST    Standing  Status:   Future    Expected Date:   03/06/2024    Expiration Date:   02/27/2025    If indicated for the ordered procedure, I authorize the administration of contrast media per Radiology protocol:   Yes    Does the patient have a contrast media/X-ray dye allergy?:   No    Preferred imaging location?:   [facility]    If indicated for the ordered procedure, I authorize the administration of oral contrast media per Radiology protocol:   Yes    All questions were answered. The patient knows to call the clinic with any problems, questions or concerns.  I have spent a total of 30 minutes minutes of face-to-face and non-face-to-face time, preparing to see the patient performing a medically appropriate examination, counseling and educating the patient, ordering tests, documenting clinical information in the electronic health record, and care coordination.   Norleen IVAR Kidney, MD Department of Hematology/Oncology [facility] Phone: [phone] Pager: [phone] Email: [email or web address]  02/28/2024 11:32 AM "

## 2024-02-28 ENCOUNTER — Other Ambulatory Visit (HOSPITAL_COMMUNITY): Payer: Self-pay

## 2024-02-28 ENCOUNTER — Encounter: Payer: Self-pay | Admitting: Oncology

## 2024-02-28 MED ORDER — LORAZEPAM 1 MG PO TABS
1.0000 mg | ORAL_TABLET | ORAL | 0 refills | Status: AC
  Filled 2024-02-28: qty 2, 1d supply, fill #0

## 2024-03-07 ENCOUNTER — Ambulatory Visit (HOSPITAL_COMMUNITY)
Admission: RE | Admit: 2024-03-07 | Discharge: 2024-03-07 | Disposition: A | Source: Ambulatory Visit | Attending: Hematology and Oncology

## 2024-03-07 DIAGNOSIS — C8319 Mantle cell lymphoma, extranodal and solid organ sites: Secondary | ICD-10-CM

## 2024-03-07 MED ORDER — IOHEXOL 300 MG/ML  SOLN
100.0000 mL | Freq: Once | INTRAMUSCULAR | Status: AC | PRN
  Administered 2024-03-07: 100 mL via INTRAVENOUS

## 2024-03-07 MED ORDER — IOHEXOL 9 MG/ML PO SOLN
500.0000 mL | ORAL | Status: AC
  Administered 2024-03-07: 500 mL via ORAL

## 2024-03-09 ENCOUNTER — Ambulatory Visit: Payer: Self-pay | Admitting: *Deleted

## 2024-03-09 NOTE — Telephone Encounter (Signed)
TCT patient regarding recent scan results.

## 2024-03-09 NOTE — Telephone Encounter (Signed)
-----   Message from Norleen Kidney, MD sent at 03/09/2024  7:43 AM EST ----- Please let [name] know that his CT scan did not show any residual/recurrent lymphoma.

## 2024-03-09 NOTE — Telephone Encounter (Signed)
 TCT patient regarding recent scan results. Spoke with him. Advised that his CT scan did not show any residual/recurrent lymphoma. Pt very pleased with results. He is aware of his next appts in July 2026

## 2024-08-31 ENCOUNTER — Inpatient Hospital Stay: Admitting: Hematology and Oncology

## 2024-08-31 ENCOUNTER — Inpatient Hospital Stay

## 2024-11-01 ENCOUNTER — Ambulatory Visit: Admitting: Adult Health
